# Patient Record
Sex: Female | Born: 1950 | Race: Black or African American | Hispanic: No
Health system: Southern US, Academic
[De-identification: ages and names within clinical notes are randomized; demographics above are authoritative.]

## PROBLEM LIST (undated history)

## (undated) ENCOUNTER — Telehealth: Attending: Family Medicine | Primary: Family Medicine

## (undated) ENCOUNTER — Encounter

## (undated) ENCOUNTER — Encounter: Attending: Hematology & Oncology | Primary: Hematology & Oncology

## (undated) ENCOUNTER — Ambulatory Visit: Payer: MEDICARE

## (undated) ENCOUNTER — Encounter: Attending: Family | Primary: Family

## (undated) ENCOUNTER — Ambulatory Visit

## (undated) ENCOUNTER — Encounter
Attending: Student in an Organized Health Care Education/Training Program | Primary: Student in an Organized Health Care Education/Training Program

## (undated) ENCOUNTER — Ambulatory Visit: Payer: MEDICARE | Attending: Adult Health | Primary: Adult Health

## (undated) ENCOUNTER — Telehealth

## (undated) ENCOUNTER — Ambulatory Visit: Payer: MEDICARE | Attending: Family | Primary: Family

## (undated) ENCOUNTER — Encounter: Attending: Family Medicine | Primary: Family Medicine

## (undated) ENCOUNTER — Telehealth
Attending: Student in an Organized Health Care Education/Training Program | Primary: Student in an Organized Health Care Education/Training Program

## (undated) ENCOUNTER — Encounter: Attending: Adult Health | Primary: Adult Health

## (undated) ENCOUNTER — Ambulatory Visit: Payer: MEDICARE | Attending: Hematology & Oncology | Primary: Hematology & Oncology

## (undated) ENCOUNTER — Encounter: Attending: Radiation Oncology | Primary: Radiation Oncology

## (undated) ENCOUNTER — Encounter: Attending: Surgical Oncology | Primary: Surgical Oncology

## (undated) ENCOUNTER — Telehealth: Attending: Family | Primary: Family

## (undated) ENCOUNTER — Ambulatory Visit: Attending: Radiation Oncology | Primary: Radiation Oncology

## (undated) ENCOUNTER — Encounter: Attending: Pharmacist | Primary: Pharmacist

## (undated) ENCOUNTER — Telehealth: Attending: Clinical | Primary: Clinical

## (undated) ENCOUNTER — Telehealth: Attending: Hematology & Oncology | Primary: Hematology & Oncology

## (undated) ENCOUNTER — Telehealth: Attending: Surgical Oncology | Primary: Surgical Oncology

## (undated) ENCOUNTER — Other Ambulatory Visit

## (undated) ENCOUNTER — Inpatient Hospital Stay

## (undated) ENCOUNTER — Ambulatory Visit: Payer: MEDICARE | Attending: Family Medicine | Primary: Family Medicine

## (undated) ENCOUNTER — Ambulatory Visit
Attending: Student in an Organized Health Care Education/Training Program | Primary: Student in an Organized Health Care Education/Training Program

## (undated) ENCOUNTER — Encounter: Attending: Anesthesiology | Primary: Anesthesiology

## (undated) ENCOUNTER — Telehealth: Attending: Adult Health | Primary: Adult Health

## (undated) ENCOUNTER — Institutional Professional Consult (permissible substitution): Payer: MEDICARE

## (undated) ENCOUNTER — Ambulatory Visit: Payer: MEDICARE | Attending: Radiation Oncology | Primary: Radiation Oncology

## (undated) ENCOUNTER — Ambulatory Visit: Attending: Adult Health | Primary: Adult Health

## (undated) ENCOUNTER — Institutional Professional Consult (permissible substitution): Payer: MEDICARE | Attending: Family Medicine | Primary: Family Medicine

## (undated) ENCOUNTER — Ambulatory Visit
Payer: MEDICARE | Attending: Rehabilitative and Restorative Service Providers" | Primary: Rehabilitative and Restorative Service Providers"

## (undated) ENCOUNTER — Ambulatory Visit
Payer: MEDICARE | Attending: Student in an Organized Health Care Education/Training Program | Primary: Student in an Organized Health Care Education/Training Program

## (undated) ENCOUNTER — Institutional Professional Consult (permissible substitution): Payer: MEDICARE | Attending: Clinical | Primary: Clinical

## (undated) ENCOUNTER — Telehealth: Attending: Internal Medicine | Primary: Internal Medicine

## (undated) ENCOUNTER — Telehealth: Attending: Pharmacist | Primary: Pharmacist

## (undated) DIAGNOSIS — I10 Essential (primary) hypertension: Secondary | ICD-10-CM

## (undated) DIAGNOSIS — C801 Malignant (primary) neoplasm, unspecified: Secondary | ICD-10-CM

## (undated) DIAGNOSIS — I2510 Atherosclerotic heart disease of native coronary artery without angina pectoris: Secondary | ICD-10-CM

## (undated) DIAGNOSIS — E785 Hyperlipidemia, unspecified: Secondary | ICD-10-CM

## (undated) DIAGNOSIS — E079 Disorder of thyroid, unspecified: Secondary | ICD-10-CM

## (undated) DIAGNOSIS — R569 Unspecified convulsions: Secondary | ICD-10-CM

## (undated) HISTORY — PX: MASTECTOMY: SHX3

## (undated) MED ORDER — LEVETIRACETAM 500 MG TABLET: 0 days

## (undated) MED ORDER — LACOSAMIDE 100 MG TABLET: Freq: Two times a day (BID) | ORAL | 0 days

---

## 2005-10-21 ENCOUNTER — Ambulatory Visit: Payer: Self-pay | Admitting: Family Medicine

## 2005-11-03 ENCOUNTER — Ambulatory Visit: Payer: Self-pay | Admitting: Family Medicine

## 2015-08-20 ENCOUNTER — Emergency Department: Payer: Self-pay

## 2015-08-20 ENCOUNTER — Encounter: Payer: Self-pay | Admitting: Emergency Medicine

## 2015-08-20 ENCOUNTER — Emergency Department: Payer: MEDICAID

## 2015-08-20 ENCOUNTER — Inpatient Hospital Stay
Admission: EM | Admit: 2015-08-20 | Discharge: 2015-08-28 | DRG: 871 | Disposition: A | Discharge status: Home / Self Care | Payer: Self-pay | Attending: Internal Medicine | Admitting: Internal Medicine

## 2015-08-20 DIAGNOSIS — A4151 Sepsis due to Escherichia coli [E. coli]: Principal | ICD-10-CM | POA: Diagnosis present

## 2015-08-20 DIAGNOSIS — I252 Old myocardial infarction: Secondary | ICD-10-CM

## 2015-08-20 DIAGNOSIS — R0902 Hypoxemia: Secondary | ICD-10-CM | POA: Diagnosis present

## 2015-08-20 DIAGNOSIS — I214 Non-ST elevation (NSTEMI) myocardial infarction: Secondary | ICD-10-CM | POA: Diagnosis present

## 2015-08-20 DIAGNOSIS — Z955 Presence of coronary angioplasty implant and graft: Secondary | ICD-10-CM

## 2015-08-20 DIAGNOSIS — E875 Hyperkalemia: Secondary | ICD-10-CM | POA: Diagnosis present

## 2015-08-20 DIAGNOSIS — R509 Fever, unspecified: Secondary | ICD-10-CM

## 2015-08-20 DIAGNOSIS — G40919 Epilepsy, unspecified, intractable, without status epilepticus: Secondary | ICD-10-CM | POA: Diagnosis present

## 2015-08-20 DIAGNOSIS — E785 Hyperlipidemia, unspecified: Secondary | ICD-10-CM | POA: Diagnosis present

## 2015-08-20 DIAGNOSIS — F321 Major depressive disorder, single episode, moderate: Secondary | ICD-10-CM | POA: Diagnosis present

## 2015-08-20 DIAGNOSIS — F17210 Nicotine dependence, cigarettes, uncomplicated: Secondary | ICD-10-CM | POA: Diagnosis present

## 2015-08-20 DIAGNOSIS — I5030 Unspecified diastolic (congestive) heart failure: Secondary | ICD-10-CM | POA: Diagnosis present

## 2015-08-20 DIAGNOSIS — J9811 Atelectasis: Secondary | ICD-10-CM | POA: Diagnosis present

## 2015-08-20 DIAGNOSIS — I10 Essential (primary) hypertension: Secondary | ICD-10-CM | POA: Diagnosis present

## 2015-08-20 DIAGNOSIS — I248 Other forms of acute ischemic heart disease: Secondary | ICD-10-CM | POA: Diagnosis present

## 2015-08-20 DIAGNOSIS — Z95828 Presence of other vascular implants and grafts: Secondary | ICD-10-CM

## 2015-08-20 DIAGNOSIS — R296 Repeated falls: Secondary | ICD-10-CM | POA: Diagnosis present

## 2015-08-20 DIAGNOSIS — Z8249 Family history of ischemic heart disease and other diseases of the circulatory system: Secondary | ICD-10-CM

## 2015-08-20 DIAGNOSIS — I2510 Atherosclerotic heart disease of native coronary artery without angina pectoris: Secondary | ICD-10-CM | POA: Diagnosis present

## 2015-08-20 DIAGNOSIS — R6510 Systemic inflammatory response syndrome (SIRS) of non-infectious origin without acute organ dysfunction: Secondary | ICD-10-CM

## 2015-08-20 DIAGNOSIS — R109 Unspecified abdominal pain: Secondary | ICD-10-CM

## 2015-08-20 DIAGNOSIS — M6282 Rhabdomyolysis: Secondary | ICD-10-CM | POA: Diagnosis present

## 2015-08-20 DIAGNOSIS — F329 Major depressive disorder, single episode, unspecified: Secondary | ICD-10-CM

## 2015-08-20 DIAGNOSIS — R410 Disorientation, unspecified: Secondary | ICD-10-CM

## 2015-08-20 DIAGNOSIS — R339 Retention of urine, unspecified: Secondary | ICD-10-CM | POA: Diagnosis present

## 2015-08-20 DIAGNOSIS — E876 Hypokalemia: Secondary | ICD-10-CM | POA: Diagnosis present

## 2015-08-20 DIAGNOSIS — W19XXXA Unspecified fall, initial encounter: Secondary | ICD-10-CM | POA: Diagnosis present

## 2015-08-20 DIAGNOSIS — Z9114 Patient's other noncompliance with medication regimen: Secondary | ICD-10-CM

## 2015-08-20 DIAGNOSIS — A419 Sepsis, unspecified organism: Secondary | ICD-10-CM | POA: Diagnosis present

## 2015-08-20 DIAGNOSIS — B9620 Unspecified Escherichia coli [E. coli] as the cause of diseases classified elsewhere: Secondary | ICD-10-CM

## 2015-08-20 DIAGNOSIS — Z79899 Other long term (current) drug therapy: Secondary | ICD-10-CM

## 2015-08-20 DIAGNOSIS — G9341 Metabolic encephalopathy: Secondary | ICD-10-CM | POA: Diagnosis present

## 2015-08-20 DIAGNOSIS — R7881 Bacteremia: Secondary | ICD-10-CM

## 2015-08-20 DIAGNOSIS — F32A Depression, unspecified: Secondary | ICD-10-CM

## 2015-08-20 HISTORY — DX: Hyperlipidemia, unspecified: E78.5

## 2015-08-20 HISTORY — DX: Unspecified convulsions: R56.9

## 2015-08-20 HISTORY — DX: Atherosclerotic heart disease of native coronary artery without angina pectoris: I25.10

## 2015-08-20 HISTORY — DX: Essential (primary) hypertension: I10

## 2015-08-20 HISTORY — DX: Disorder of thyroid, unspecified: E07.9

## 2015-08-20 LAB — COMPREHENSIVE METABOLIC PANEL
ALBUMIN: 3.9 g/dL (ref 3.5–5.0)
ALK PHOS: 121 U/L (ref 38–126)
ALT: 19 U/L (ref 14–54)
AST: 159 U/L — AB (ref 15–41)
Anion gap: 12 (ref 5–15)
BILIRUBIN TOTAL: 1 mg/dL (ref 0.3–1.2)
BUN: 17 mg/dL (ref 6–20)
CALCIUM: 8.3 mg/dL — AB (ref 8.9–10.3)
CO2: 31 mmol/L (ref 22–32)
CREATININE: 0.97 mg/dL (ref 0.44–1.00)
Chloride: 91 mmol/L — ABNORMAL LOW (ref 101–111)
GFR calc Af Amer: >60 mL/min (ref >60)
GFR calc non Af Amer: >60 mL/min (ref >60)
GLUCOSE: 109 mg/dL — AB (ref 65–99)
Potassium: 2.5 mmol/L — CL (ref 3.5–5.1)
SODIUM: 134 mmol/L — AB (ref 135–145)
Total Protein: 8.3 g/dL — ABNORMAL HIGH (ref 6.5–8.1)

## 2015-08-20 LAB — PROTIME-INR
INR: 1.77
Prothrombin Time: 20.6 seconds — ABNORMAL HIGH (ref 11.4–15.0)

## 2015-08-20 LAB — URINALYSIS COMPLETE WITH MICROSCOPIC (ARMC ONLY)
Bilirubin Urine: NEGATIVE
Glucose, UA: NEGATIVE mg/dL
Nitrite: POSITIVE — AB
PROTEIN: 100 mg/dL — AB
SPECIFIC GRAVITY, URINE: 1.013 (ref 1.005–1.030)
SQUAMOUS EPITHELIAL / LPF: NONE SEEN
pH: 5 (ref 5.0–8.0)

## 2015-08-20 LAB — CBC
HEMATOCRIT: 42.7 % (ref 35.0–47.0)
HEMOGLOBIN: 14 g/dL (ref 12.0–16.0)
MCH: 31.7 pg (ref 26.0–34.0)
MCHC: 32.8 g/dL (ref 32.0–36.0)
MCV: 96.6 fL (ref 80.0–100.0)
Platelets: 179 10*3/uL (ref 150–440)
RBC: 4.42 MIL/uL (ref 3.80–5.20)
RDW: 14.2 % (ref 11.5–14.5)
WBC: 13.5 10*3/uL — ABNORMAL HIGH (ref 3.6–11.0)

## 2015-08-20 LAB — TROPONIN I: Troponin I: 0.62 ng/mL — ABNORMAL HIGH (ref <0.031)

## 2015-08-20 LAB — PROCALCITONIN: Procalcitonin: 7.9 ng/mL

## 2015-08-20 LAB — INFLUENZA PANEL BY PCR (TYPE A & B)
H1N1 flu by pcr: NOT DETECTED
Influenza A By PCR: NEGATIVE
Influenza B By PCR: NEGATIVE

## 2015-08-20 LAB — CK: CK TOTAL: 20287 U/L — AB (ref 38–234)

## 2015-08-20 LAB — LACTIC ACID, PLASMA
LACTIC ACID, VENOUS: 1.6 mmol/L (ref 0.5–2.0)
Lactic Acid, Venous: 1 mmol/L (ref 0.5–2.0)

## 2015-08-20 LAB — APTT

## 2015-08-20 LAB — LIPASE, BLOOD: Lipase: 12 U/L (ref 11–51)

## 2015-08-20 LAB — PHENYTOIN LEVEL, TOTAL: Phenytoin Lvl: 31.2 ug/mL (ref 10.0–20.0)

## 2015-08-20 IMAGING — CT CT HEAD W/O CM
1 series · 16 of 27 positions shown, 20 images · non-contrast
Comparison: None.

CLINICAL DATA: 64-year-old female with altered mental status

EXAM:
CT HEAD WITHOUT CONTRAST
TECHNIQUE: Contiguous axial images were obtained from the base of the skull
through the vertex without intravenous contrast.

[Series 2: soft tissue · axial · 0.47mm/px · z∈[-159,-39]mm · 16 of 27 slices shown, 20 images]
[im 2/27  brain]
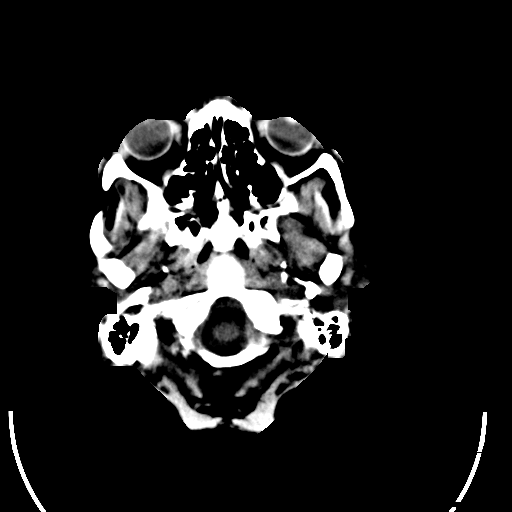
[im 2/27  bone]
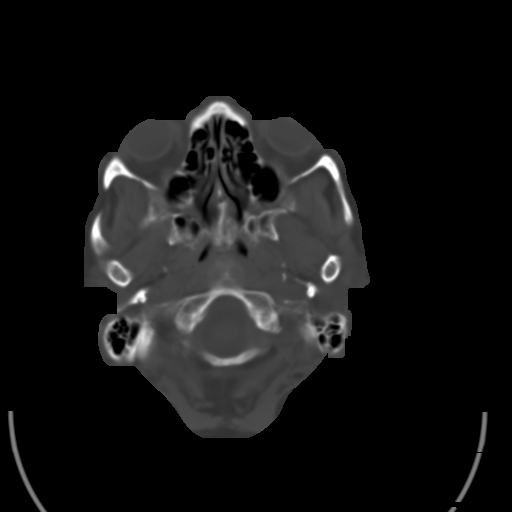
[im 4/27  brain]
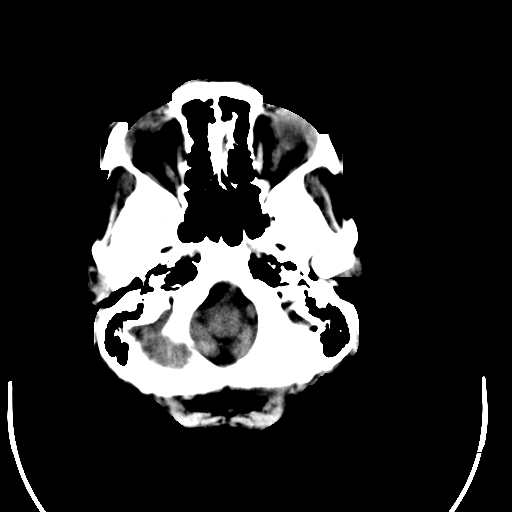
[im 5/27  brain]
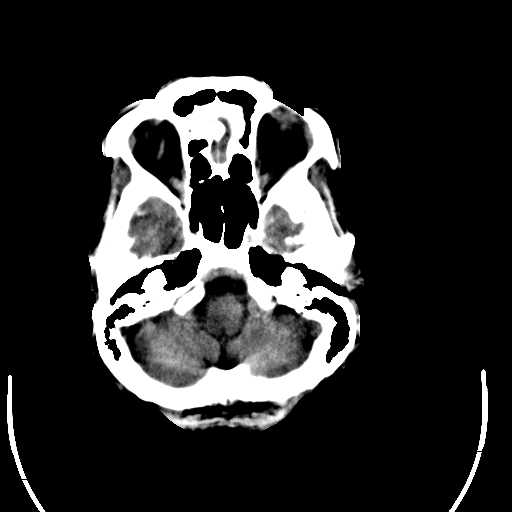
[im 7/27  brain]
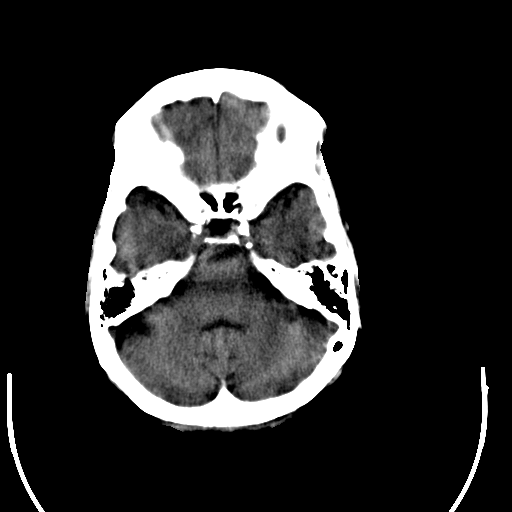
[im 9/27  brain]
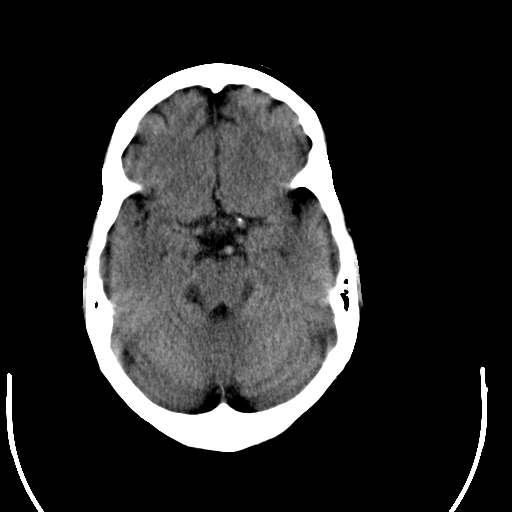
[im 9/27  bone]
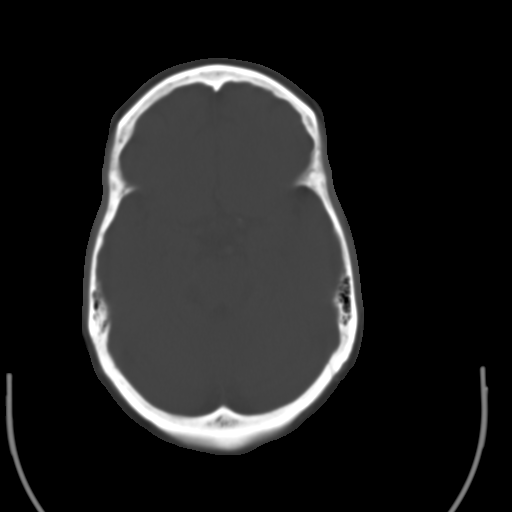
[im 10/27  brain]
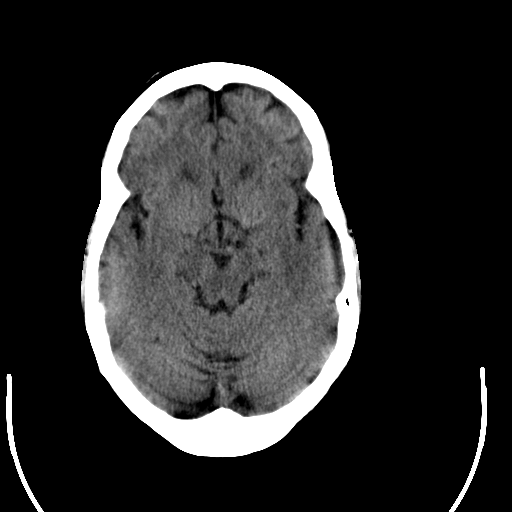
[im 12/27  brain]
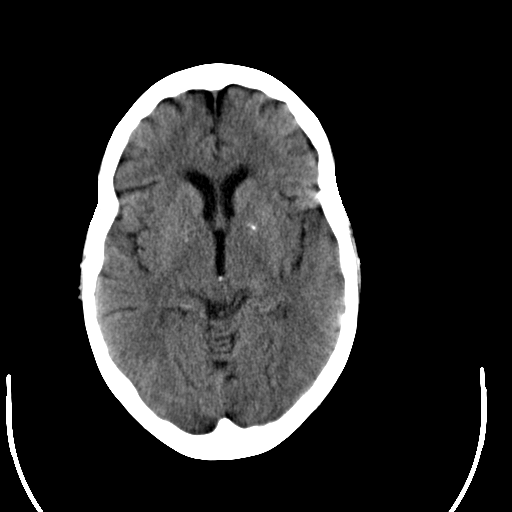
[im 13/27  brain]
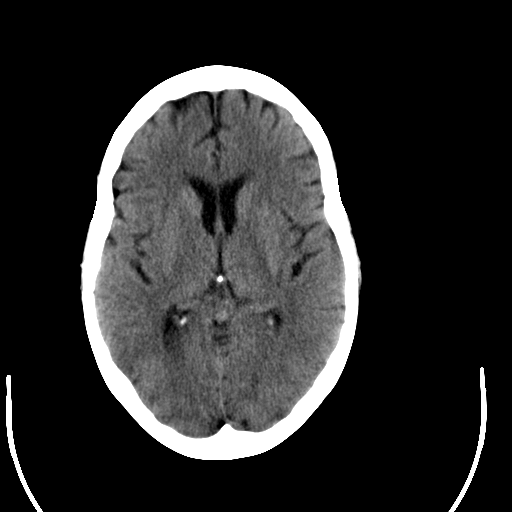
[im 15/27  brain]
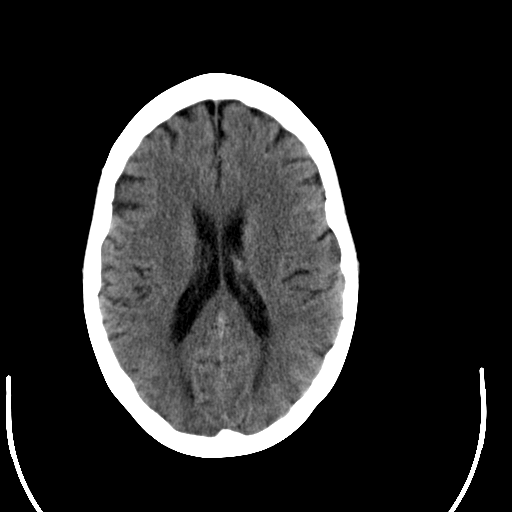
[im 15/27  bone]
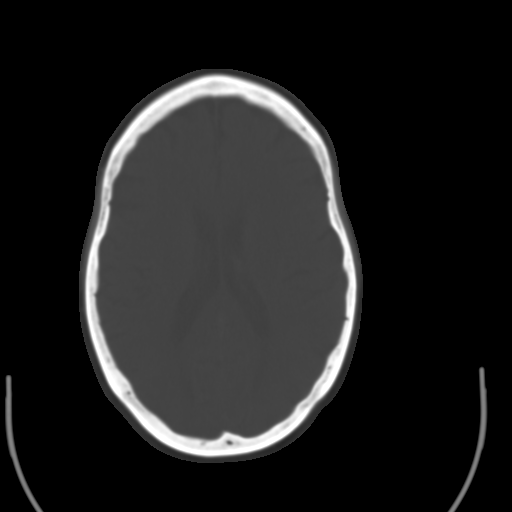
[im 16/27  brain]
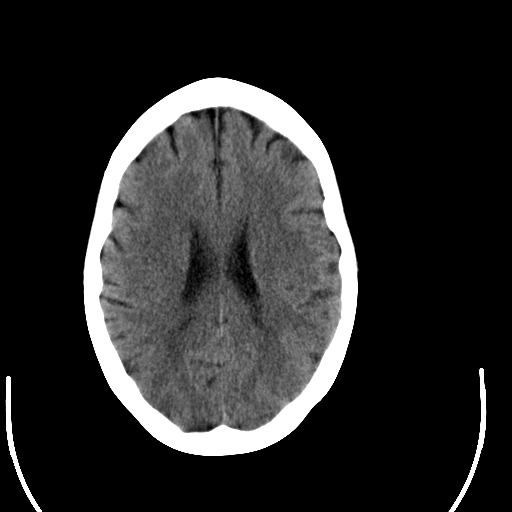
[im 18/27  brain]
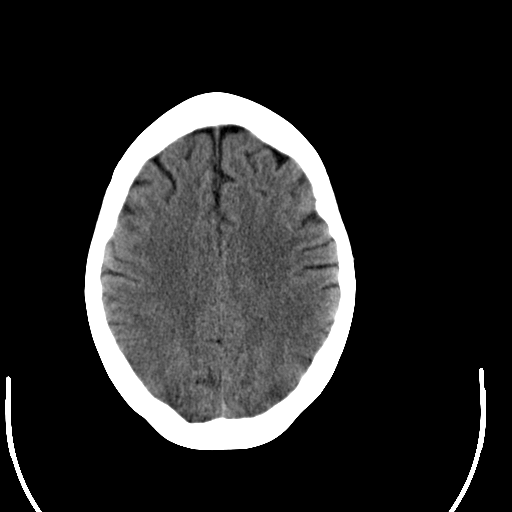
[im 19/27  brain]
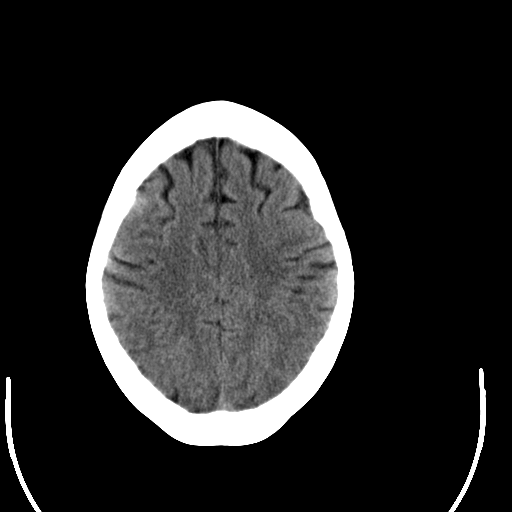
[im 21/27  brain]
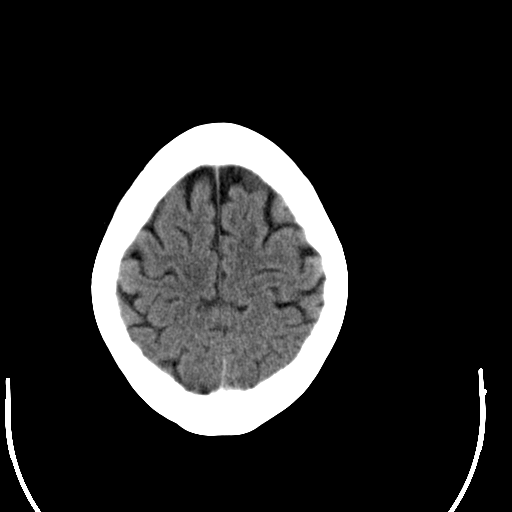
[im 21/27  bone]
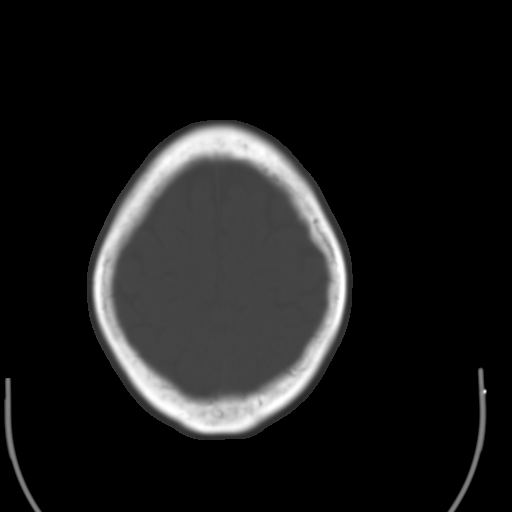
[im 23/27  brain]
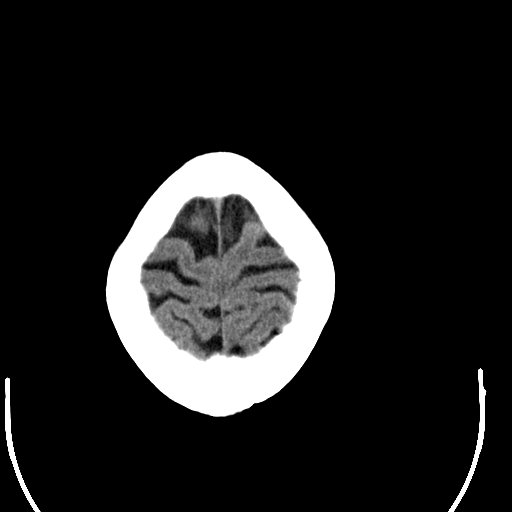
[im 24/27  brain]
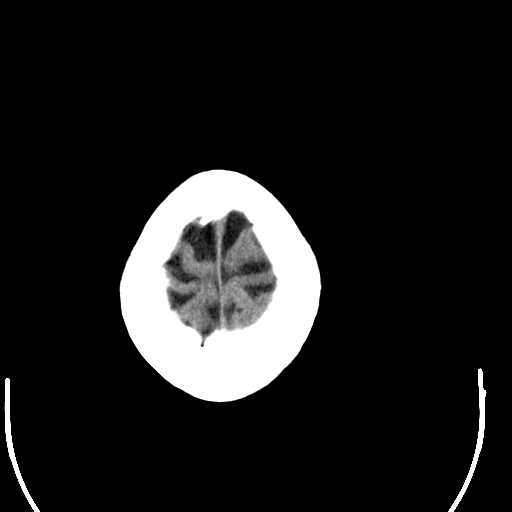
[im 26/27  brain]
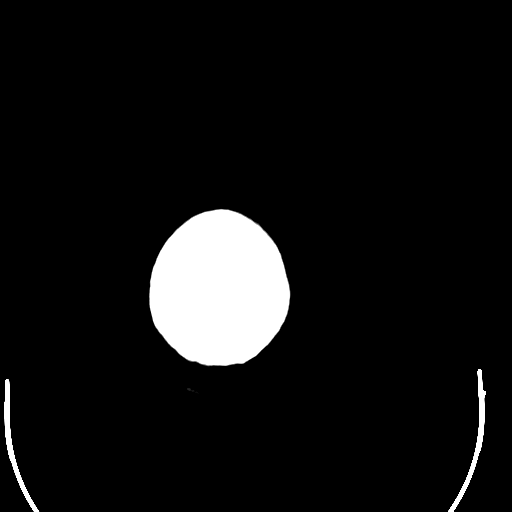

[16 of 27 positions shown; findings below may reference images not displayed]

FINDINGS: Negative for acute intracranial hemorrhage, acute infarction, mass,
mass effect, hydrocephalus or midline shift. Gray-white
differentiation is preserved throughout. Very mild periventricular
white matter hypoattenuation consistent with chronic small vessel
ischemic white matter disease. No focal soft tissue or calvarial
abnormality. Globes and orbits are unremarkable bilaterally. Normal
aeration of the mastoid air cells and visualized paranasal sinuses.
Atherosclerotic calcifications in both cavernous carotid arteries.
IMPRESSION: 1. No acute intracranial abnormality.
2. Minimal chronic microvascular ischemic white matter disease.
3. Intracranial atherosclerosis.

## 2015-08-20 IMAGING — CR DG CHEST 1V PORT
1 series · 1 of 1 positions shown · non-contrast
Comparison: None.

CLINICAL DATA: Altered mental status today, patient smokes

EXAM:
PORTABLE CHEST 1 VIEW

[ap]
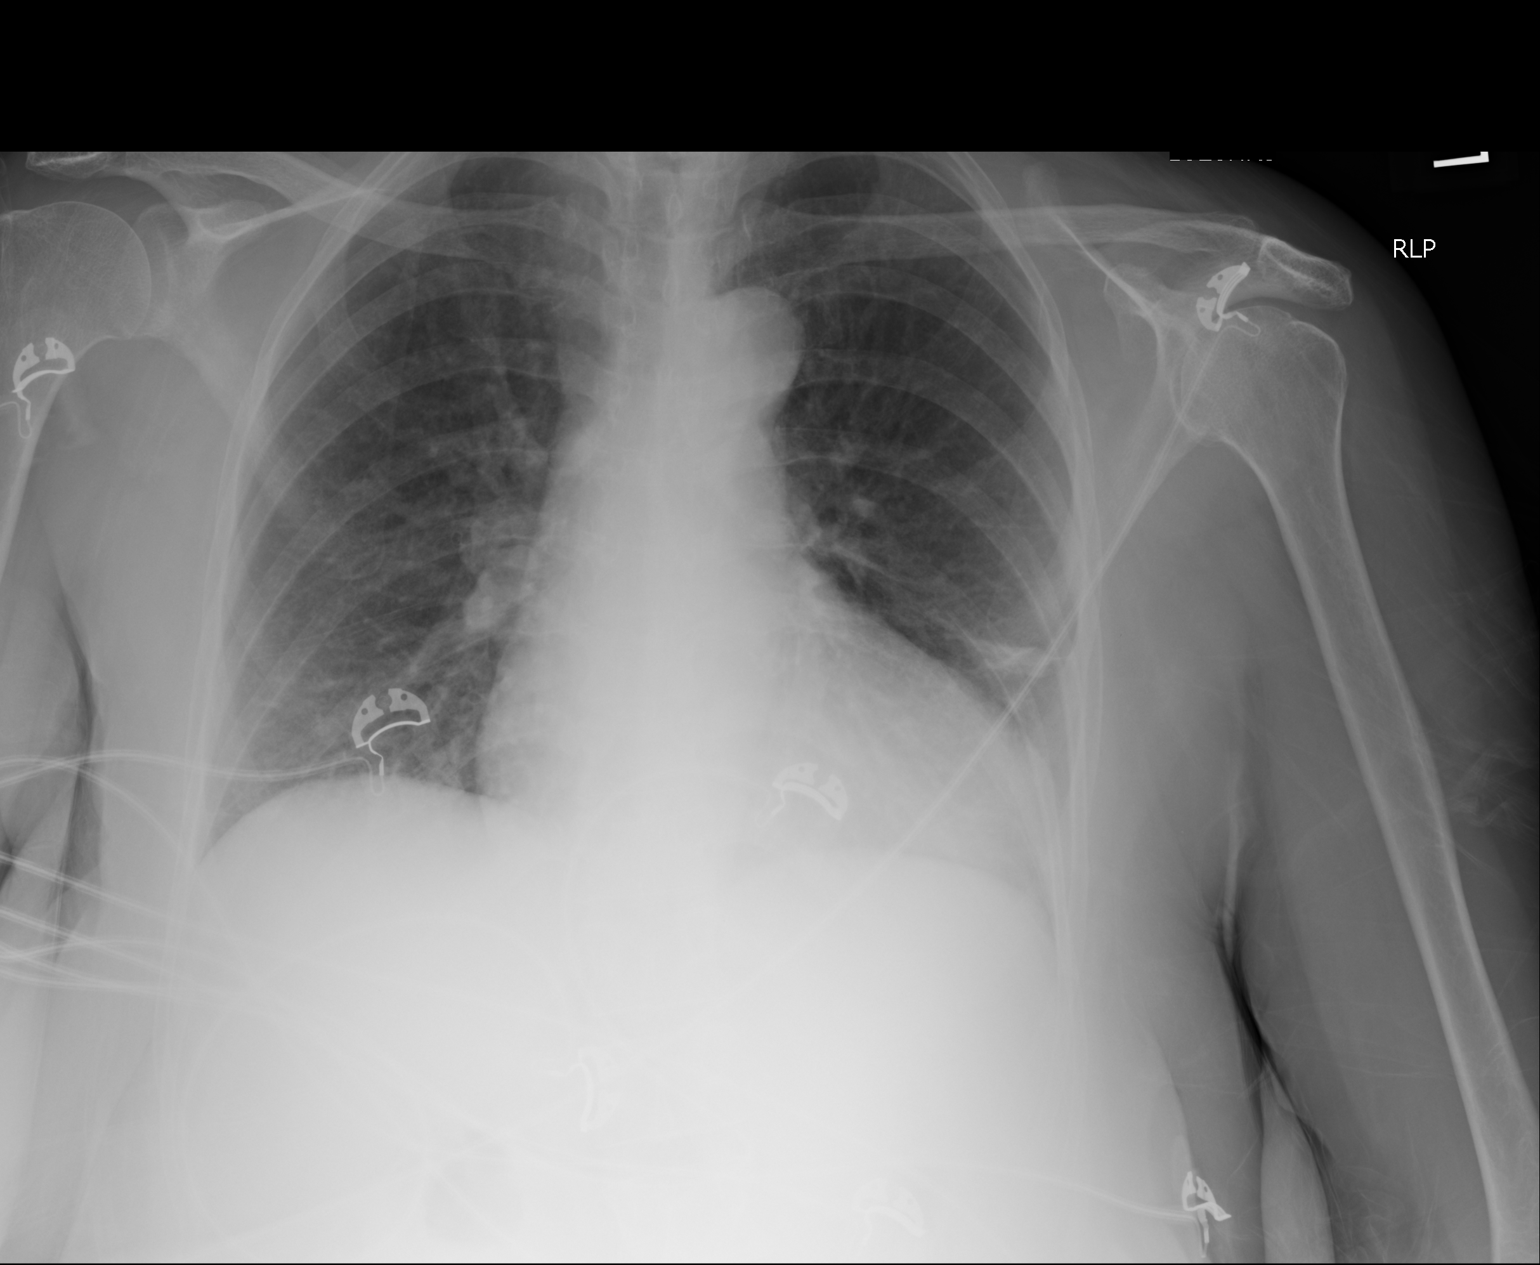

[1 of 1 positions shown; findings below may reference images not displayed]

FINDINGS: Mild cardiac silhouette enlargement. Vascular pattern normal. Right
lung clear. Band of opacity laterally in the lingula appears most
consistent with subsegmental atelectasis.
IMPRESSION: Mild subsegmental atelectasis in the lingula ; cannot exclude
possibility that this may be due to central obstructing process
although mediastinal and hilar contours appear within normal limits
on this single AP view. Correlate clinically.

## 2015-08-20 MED ORDER — PIPERACILLIN-TAZOBACTAM 3.375 G IVPB
3.3750 g | Freq: Three times a day (TID) | INTRAVENOUS | Status: DC
  Administered 2015-08-21: 3.375 g via INTRAVENOUS
  Filled 2015-08-20 (×3): qty 50

## 2015-08-20 MED ORDER — SODIUM CHLORIDE 0.9 % IJ SOLN
3.0000 mL | Freq: Two times a day (BID) | INTRAMUSCULAR | Status: DC
Start: 2015-08-20 — End: 2015-08-28
  Administered 2015-08-21 – 2015-08-28 (×11): 3 mL via INTRAVENOUS

## 2015-08-20 MED ORDER — ASPIRIN 300 MG RE SUPP
150.0000 mg | Freq: Once | RECTAL | Status: AC
  Administered 2015-08-20: 150 mg via RECTAL
  Filled 2015-08-20: qty 1

## 2015-08-20 MED ORDER — VANCOMYCIN HCL IN DEXTROSE 1-5 GM/200ML-% IV SOLN
1000.0000 mg | Freq: Once | INTRAVENOUS | Status: AC
Start: 2015-08-20 — End: 2015-08-20
  Administered 2015-08-20: 1000 mg via INTRAVENOUS
  Filled 2015-08-20: qty 200

## 2015-08-20 MED ORDER — HEPARIN BOLUS VIA INFUSION
3900.0000 [IU] | Freq: Once | INTRAVENOUS | Status: AC
  Administered 2015-08-20: 3900 [IU] via INTRAVENOUS
  Filled 2015-08-20: qty 3900

## 2015-08-20 MED ORDER — ACETAMINOPHEN 325 MG PO TABS
650.0000 mg | ORAL_TABLET | Freq: Four times a day (QID) | ORAL | Status: DC | PRN
  Administered 2015-08-21 – 2015-08-26 (×4): 650 mg via ORAL
  Filled 2015-08-20 (×4): qty 2

## 2015-08-20 MED ORDER — HEPARIN (PORCINE) IN NACL 100-0.45 UNIT/ML-% IJ SOLN
800.0000 [IU]/h | INTRAMUSCULAR | Status: DC
  Administered 2015-08-20: 800 [IU]/h via INTRAVENOUS
  Filled 2015-08-20: qty 250

## 2015-08-20 MED ORDER — HYDRALAZINE HCL 20 MG/ML IJ SOLN: 10.0000 mg | INTRAMUSCULAR | Status: DC | PRN

## 2015-08-20 MED ORDER — ONDANSETRON HCL 4 MG/2ML IJ SOLN
4.0000 mg | Freq: Four times a day (QID) | INTRAMUSCULAR | Status: DC | PRN
Start: 2015-08-20 — End: 2015-08-28

## 2015-08-20 MED ORDER — SODIUM CHLORIDE 0.9 % IV BOLUS (SEPSIS)
1000.0000 mL | Freq: Once | INTRAVENOUS | Status: AC
  Administered 2015-08-20: 1000 mL via INTRAVENOUS

## 2015-08-20 MED ORDER — OXYCODONE HCL 5 MG PO TABS
5.0000 mg | ORAL_TABLET | ORAL | Status: DC | PRN
  Administered 2015-08-23 – 2015-08-25 (×2): 5 mg via ORAL
  Filled 2015-08-20 (×2): qty 1

## 2015-08-20 MED ORDER — POTASSIUM CHLORIDE 10 MEQ/100ML IV SOLN
10.0000 meq | INTRAVENOUS | Status: AC
  Administered 2015-08-20 (×2): 10 meq via INTRAVENOUS
  Filled 2015-08-20 (×3): qty 100

## 2015-08-20 MED ORDER — ACETAMINOPHEN 650 MG RE SUPP
650.0000 mg | Freq: Once | RECTAL | Status: AC
  Administered 2015-08-20: 650 mg via RECTAL
  Filled 2015-08-20: qty 1

## 2015-08-20 MED ORDER — IPRATROPIUM-ALBUTEROL 0.5-2.5 (3) MG/3ML IN SOLN
3.0000 mL | Freq: Once | RESPIRATORY_TRACT | Status: AC
  Administered 2015-08-20: 3 mL via RESPIRATORY_TRACT
  Filled 2015-08-20: qty 3

## 2015-08-20 MED ORDER — ACETAMINOPHEN 650 MG RE SUPP
650.0000 mg | Freq: Four times a day (QID) | RECTAL | Status: DC | PRN
Start: 2015-08-20 — End: 2015-08-28

## 2015-08-20 MED ORDER — ONDANSETRON HCL 4 MG PO TABS: 4.0000 mg | ORAL_TABLET | Freq: Four times a day (QID) | ORAL | Status: DC | PRN

## 2015-08-20 MED ORDER — METHYLPREDNISOLONE SODIUM SUCC 125 MG IJ SOLR
125.0000 mg | INTRAMUSCULAR | Status: AC
  Administered 2015-08-20: 125 mg via INTRAVENOUS
  Filled 2015-08-20: qty 2

## 2015-08-20 MED ORDER — POTASSIUM CHLORIDE IN NACL 20-0.9 MEQ/L-% IV SOLN
INTRAVENOUS | Status: DC
  Administered 2015-08-20 – 2015-08-24 (×3): via INTRAVENOUS
  Filled 2015-08-20 (×12): qty 1000

## 2015-08-20 MED ORDER — PIPERACILLIN-TAZOBACTAM 3.375 G IVPB
3.3750 g | Freq: Once | INTRAVENOUS | Status: AC
  Administered 2015-08-20: 3.375 g via INTRAVENOUS
  Filled 2015-08-20: qty 50

## 2015-08-20 MED ORDER — MORPHINE SULFATE (PF) 2 MG/ML IV SOLN
2.0000 mg | INTRAVENOUS | Status: DC | PRN
  Administered 2015-08-21 – 2015-08-27 (×7): 2 mg via INTRAVENOUS
  Filled 2015-08-20 (×7): qty 1

## 2015-08-20 MED ORDER — VANCOMYCIN HCL IN DEXTROSE 1-5 GM/200ML-% IV SOLN
1000.0000 mg | INTRAVENOUS | Status: DC
  Filled 2015-08-20 (×2): qty 200

## 2015-08-20 NOTE — H&P (Signed)
[facility] NAME: Alyda Nier    MR#:  [id number]  DATE OF BIRTH:  [birth year]   DATE OF ADMISSION:  08/20/2015  PRIMARY CARE PHYSICIAN: Pcp Not In System   REQUESTING/REFERRING PHYSICIAN: Quale  CHIEF COMPLAINT:   Chief Complaint  Patient presents with  . Altered Mental Status    HISTORY OF PRESENT ILLNESS:  Machelle Cherian  is a 64 y.o. female with a known history of seizure disorder, coronary artery disease status post stent placement, medical noncompliance is presenting with altered mental status. Patient is unable to provide any meaningful information given mental status medical condition history obtained from patient's sisters present at bedside. They state that she has had progressive decline over the course of several months however most prominent over the last 2-3 days where she has had profound weakness to the point where she is unable to relate by herself to the bathroom. She has also become increasingly lethargic over the past same time period. As far as localizing symptoms they described she has a cough however this is chronic as on a usual daily basis she stays in the couch sleeps and smokes approximately 2-3 packs of cigarettes daily Upon arriving to the emergency department noted to be febrile as well as leukocytosis  PAST MEDICAL HISTORY:   Past Medical History  Diagnosis Date  . Seizures ([place])   . Thyroid disease   . Coronary artery disease   . Hypertension   . Hyperlipidemia     PAST SURGICAL HISTORY:  History reviewed. No pertinent past surgical history.  SOCIAL HISTORY:   Social History  Substance Use Topics  . Smoking status: Current Every Day Smoker -- 1.00 packs/day    Types: Cigarettes  . Smokeless tobacco: Never Used  . Alcohol Use: No    FAMILY HISTORY:   Family History  Problem Relation Age of Onset  . Hypertension Other     DRUG ALLERGIES:  No Known Allergies  REVIEW  OF SYSTEMS:  Unable to obtain given patient's mental status/medical condition   MEDICATIONS AT HOME:   Prior to Admission medications   Medication Sig Start Date End Date Taking? Authorizing Provider  clonazePAM (KLONOPIN) 1 MG tablet Take 1 mg by mouth 2 (two) times daily.   Yes Historical Provider, MD  gabapentin (NEURONTIN) 300 MG capsule Take 300 mg by mouth 3 (three) times daily.   Yes Historical Provider, MD  levETIRAcetam (KEPPRA) 500 MG tablet Take 500-1,000 mg by mouth 2 (two) times daily. Take 1 tablet every morning and 2 tablets every evening.   Yes Historical Provider, MD  phenytoin (DILANTIN) 100 MG ER capsule Take 300 mg by mouth at bedtime.   Yes Historical Provider, MD      VITAL SIGNS:  Blood pressure 135/86, pulse 108, temperature 104.2 F (40.1 C), temperature source Oral, resp. rate 19, SpO2 97 %.  PHYSICAL EXAMINATION:  VITAL SIGNS: Filed Vitals:   08/20/15 2000 08/20/15 2030  BP: 184/82 135/86  Pulse: 105 108  Temp:    Resp: 30 19   GENERAL:64 y.o.female currently in ill appearing  HEAD: Normocephalic, atraumatic.  EYES: Pupils equal, round, reactive to light. Extraocular muscles intact. No scleral icterus.  MOUTH:Dry mucosal membrane. Dentition intact. No abscess noted.  EAR, NOSE, THROAT: Clear without exudates. No external lesions.  NECK: Supple. No thyromegaly. No nodules. No JVD.  PULMONARY:  diminished breath sounds  without wheeze rails or rhonci.  tachypneic No use of  accessory muscles,  poorratory effort. good air entry bilaterally CHEST: Nontender to palpation.  CARDIOVASCULAR: S1 and S2.Tachycardic  No murmurs, rubs, or gallops. No edema. Pedal pulses 2+ bilaterally.  GASTROINTESTINAL: Soft, nontender, nondistended. No masses. Positive bowel sounds. No hepatosplenomegaly.  MUSCULOSKELETAL: No swelling, clubbing, or edema. Range of motion full in all extremities.  NEUROLunable to fully assess given patient's mental status/medical condition she  does grimace to painful stimuli SKIN: No ulceration, lesions, rashes, or cyanosis. Skin  hot and dry. Turgor intact.  PSYCHIATRIC unable to assess given patient's mental status/medical condition  LABORATORY PANEL:   CBC  Recent Labs Lab 08/20/15 1829  WBC 13.5*  HGB 14.0  HCT 42.7  PLT 179   ------------------------------------------------------------------------------------------------------------------  Chemistries   Recent Labs Lab 08/20/15 1829  NA 134*  K 2.5*  CL 91*  CO2 31  GLUCOSE 109*  BUN 17  CREATININE 0.97  CALCIUM 8.3*  AST 159*  ALT 19  ALKPHOS 121  BILITOT 1.0   ------------------------------------------------------------------------------------------------------------------  Cardiac Enzymes  Recent Labs Lab 08/20/15 1829  TROPONINI 0.62*   ------------------------------------------------------------------------------------------------------------------  RADIOLOGY:  Ct Head Wo Contrast  08/20/2015  CLINICAL DATA:  64 year old female with altered mental status EXAM: CT HEAD WITHOUT CONTRAST TECHNIQUE: Contiguous axial images were obtained from the base of the skull through the vertex without intravenous contrast. COMPARISON:  None. FINDINGS: Negative for acute intracranial hemorrhage, acute infarction, mass, mass effect, hydrocephalus or midline shift. Gray-white differentiation is preserved throughout. 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 No focal soft tissue or calvarial abnormality. Globes and orbits are unremarkable bilaterally. Normal aeration of the mastoid air cells and visualized paranasal sinuses. Atherosclerotic calcifications in both cavernous carotid arteries. IMPRESSION: 1. No acute intracranial abnormality. 2. 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 3. Intracranial atherosclerosis. Electronically Signed   By: Jacqulynn Cadet M.D.    On: 08/20/2015 19:29   Dg Chest Port 1 View  08/20/2015  CLINICAL DATA:  Altered mental status today, patient smokes EXAM: PORTABLE CHEST 1 VIEW COMPARISON:  None. FINDINGS: Mild cardiac silhouette enlargement. Vascular pattern normal. Right lung clear. Band of opacity laterally in the lingula appears most consistent with subsegmental atelectasis. IMPRESSION: Mild subsegmental atelectasis in the lingula ; cannot exclude possibility that this may be due to central obstructing process although mediastinal and hilar contours appear within normal limits on this single AP view. Correlate clinically. Electronically Signed   By: Skipper Cliche M.D.   On: 08/20/2015 18:48    EKG:   Orders placed or performed during the hospital encounter of 08/20/15  . ED EKG  . ED EKG  . EKG 12-Lead  . EKG 12-Lead    IMPRESSION AND PLAN:   64 year old African-American female history of coronary artery disease as well as seizure disorder and medical noncompliance presenting with altered mental status  1.Sepsis, meeting septic criteria by temperature, leukocytosis, heart rate present on arrival. Source unclear etiology at this time urine sample pending  Panculture. Broad-spectrum antibiotics including Comycin/Zosyn and taper antibiotics when culture data returns. He has received a 30 mL/kg IV fluid bolus. Continue IV fluid hydration to keep mean arterial pressure greater than 65. He may require pressor therapy if blood pressure worsens. We will repeat lactic acid given the initial is greater than 2.2.  2. NSTEMI: Suspect this is secondary to demand however unable to obtain history from patient's we'll treat as true NSTEMI aspirin therapy to avoid statins given rhabdo, therapeutic heparin check  echocardiogram will consult cardiology 3. Rhabdomyolysis: IV fluid hydration will avoid statin therapy in the interim follow CK 4. Hypertension essential: Hold diuretics continue medication as as needed hydralazine 5.  Hypokalemia replace potassium check magnesium level goal 4-5 6. Venous from embolism prophylactic: Therapeutic heparin     All the records are reviewed and case discussed with ED provider. Management plans discussed with the patient, family and they are in agreement.  CODE STATUS: Full  TOTAL TIME TAKING CARE OF THIS PATIENT: 55 minutes.    Mckenlee Mangham,  Karenann Cai.D on 08/20/2015 at 8:46 PM  Between 7am to 6pm - Pager - [phone]  After 6pm: House Pager: - [phone]  [facility]  Office  [phone]  CC: Primary care physician; Pcp Not In System

## 2015-08-20 NOTE — ED Notes (Addendum)
Pt presents to ED via EMS for altered mental status for unknown date and time. Family found pt this evening confused. Per EMS, CBG-86. Sister reports pt was last seen well on Saturday. Then was able to call one of her sisters early this morning around 200, stating that she was feeling weak and she fell. Sisters came to her place and she was sitter in a chair unable to ambulate and confused. Pt was confused to date at arrival.

## 2015-08-20 NOTE — ED Notes (Signed)
Pharmacy call to send potassium, states will send it.

## 2015-08-20 NOTE — ED Provider Notes (Signed)
[facility] Emergency Department Provider Note REMINDER - THIS NOTE IS NOT A FINAL MEDICAL RECORD UNTIL IT IS SIGNED. UNTIL THEN, THE CONTENT BELOW MAY REFLECT INFORMATION FROM A DOCUMENTATION TEMPLATE, NOT THE ACTUAL PATIENT VISIT. ____________________________________________  Time seen: Approximately 7:00 PM  I have reviewed the triage vital signs and the nursing notes.   HISTORY  Chief Complaint Altered Mental Status  EM caveat: Patient confused and unable to provide clear history  HPI Anne Phillips is a 64 y.o. female sent for evaluation of confusion.  No family available. Per EMS the patient was seen by family and they found her confused today. She was sitting in a chair unable to get herself out. The patient tells me that she couldn't get out of the chair for the last day, but she is very unclear and can't really recall falling or otherwise. She is not found to be down or injured.  Patient denies being in pain.  Patient is unable to provide her past medical history  Past Medical History  Diagnosis Date  . Seizures ([place])   . Thyroid disease   . Coronary artery disease   . Hypertension   . Hyperlipidemia     Patient Active Problem List   Diagnosis Date Noted  . Sepsis ([place]) 08/20/2015  . NSTEMI (non-ST elevated myocardial infarction) ([place]) 08/20/2015  . Rhabdomyolysis 08/20/2015    History reviewed. No pertinent past surgical history.  Current Outpatient Rx  Name  Route  Sig  Dispense  Refill  . clonazePAM (KLONOPIN) 1 MG tablet   Oral   Take 1 mg by mouth 2 (two) times daily.         Marland Kitchen gabapentin (NEURONTIN) 300 MG capsule   Oral   Take 300 mg by mouth 3 (three) times daily.         Marland Kitchen levETIRAcetam (KEPPRA) 500 MG tablet   Oral   Take 500-1,000 mg by mouth 2 (two) times daily. Take 1 tablet every morning and 2 tablets every evening.         . phenytoin (DILANTIN) 100 MG ER capsule   Oral   Take 300 mg by mouth at  bedtime.           Allergies Review of patient's allergies indicates no known allergies.  Family History  Problem Relation Age of Onset  . Hypertension Other     Social History Social History  Substance Use Topics  . Smoking status: Current Every Day Smoker -- 1.00 packs/day    Types: Cigarettes  . Smokeless tobacco: Never Used  . Alcohol Use: No    Review of Systems  Patient denies being in pain. She is not able to provide much additional history.  10-point ROS otherwise negative.  ____________________________________________   PHYSICAL EXAM:  VITAL SIGNS: ED Triage Vitals  Enc Vitals Group     BP 08/20/15 1814 164/96 mmHg     Pulse Rate 08/20/15 1814 119     Resp 08/20/15 1814 18     Temp 08/20/15 1814 104.2 F (40.1 C)     Temp Source 08/20/15 1814 Oral     SpO2 08/20/15 1814 89 %     Weight --      Height --      Head Cir --      Peak Flow --      Pain Score 08/20/15 1815 0     Pain Loc --      Pain Edu? --  Excl. in [place]? --    Constitutional: Alert to voice, does not appear to be any distress. Resting in the bed, opens her eyes to voice and answer simple questions but is confused to date and time. Eyes: Conjunctivae are normal. PERRL. EOMI. Head: Atraumatic. Nose: No congestion/rhinnorhea. Mouth/Throat: Mucous membranes are dry.  Oropharynx non-erythematous. Neck: No stridor. No cervical spine tenderness  Cardiovascular: Very tachycardic rate, regular rhythm. Moderate systolic ejection murmur  Good peripheral circulation. Respiratory: Normal respiratory effort.  No retractions. Lungs CTAB. Gastrointestinal: Soft and nontender. No distention. No abdominal bruits. No CVA tenderness. Musculoskeletal: No lower extremity tenderness nor edema.  No joint effusions. Neurologic:  Normal speech and language. No gross focal neurologic deficits are appreciated. Skin:  Skin is warm, dry and intact. No rash noted. Psychiatric: Mood and affect are normal.  Speech and behavior are normal.  ____________________________________________   LABS (all labs ordered are listed, but only abnormal results are displayed)  Labs Reviewed  CBC - Abnormal; Notable for the following:    WBC 13.5 (*)    All other components within normal limits  COMPREHENSIVE METABOLIC PANEL - Abnormal; Notable for the following:    Sodium 134 (*)    Potassium 2.5 (*)    Chloride 91 (*)    Glucose, Bld 109 (*)    Calcium 8.3 (*)    Total Protein 8.3 (*)    AST 159 (*)    All other components within normal limits  TROPONIN I - Abnormal; Notable for the following:    Troponin I 0.62 (*)    All other components within normal limits  CK - Abnormal; Notable for the following:    Total CK 20[address] (*)    All other components within normal limits  URINALYSIS COMPLETEWITH MICROSCOPIC ([facility] ONLY) - Abnormal; Notable for the following:    Color, Urine YELLOW (*)    APPearance CLOUDY (*)    Ketones, ur TRACE (*)    Hgb urine dipstick 3+ (*)    Protein, ur 100 (*)    Nitrite POSITIVE (*)    Leukocytes, UA 2+ (*)    Bacteria, UA RARE (*)    All other components within normal limits  PHENYTOIN LEVEL, TOTAL - Abnormal; Notable for the following:    Phenytoin Lvl 31.2 (*)    All other components within normal limits  CULTURE, BLOOD (ROUTINE X 2)  CULTURE, BLOOD (ROUTINE X 2)  LIPASE, BLOOD  LACTIC ACID, PLASMA  INFLUENZA PANEL BY PCR (TYPE A & B, H1N1)  LACTIC ACID, PLASMA  BASIC METABOLIC PANEL  CBC  PROLACTIN  PROCALCITONIN  PROTIME-INR  APTT  HEPARIN LEVEL (UNFRACTIONATED)   ____________________________________________  EKG  Reviewed and interpreted by me at 1850 Sinus tachycardia Heart rate 110 No significant ischemic changes noted Probable left ventricular hypertrophy with repolarization changes noted anteroseptal PR 100 QTc 440 QRS 90  ____________________________________________  RADIOLOGY   CT Head Wo Contrast (Final result) Result time:  08/20/15 19:29:30   Final result by Rad Results In Interface (08/20/15 19:29:30)   Narrative:   CLINICAL DATA: 64 year old female with altered mental status  EXAM: CT HEAD WITHOUT CONTRAST  TECHNIQUE: Contiguous axial images were obtained from the base of the skull through the vertex without intravenous contrast.  COMPARISON: None.  FINDINGS: Negative for acute intracranial hemorrhage, acute infarction, mass, mass effect, hydrocephalus or midline shift. Gray-white differentiation is preserved throughout. 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 No focal soft  tissue or calvarial abnormality. Globes and orbits are unremarkable bilaterally. Normal aeration of the mastoid air cells and visualized paranasal sinuses. Atherosclerotic calcifications in both cavernous carotid arteries.  IMPRESSION: 1. No acute intracranial abnormality. 2. 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 3. Intracranial atherosclerosis.   Electronically Signed By: Jacqulynn Cadet M.D. On: 08/20/2015 19:29          DG Chest Port 1 View (Final result) Result time: 08/20/15 18:48:46   Final result by Rad Results In Interface (08/20/15 18:48:46)   Narrative:   CLINICAL DATA: Altered mental status today, patient smokes  EXAM: PORTABLE CHEST 1 VIEW  COMPARISON: None.  FINDINGS: Mild cardiac silhouette enlargement. Vascular pattern normal. Right lung clear. Band of opacity laterally in the lingula appears most consistent with subsegmental atelectasis.  IMPRESSION: Mild subsegmental atelectasis in the lingula ; cannot exclude possibility that this may be due to central obstructing process although mediastinal and hilar contours appear within normal limits on this single AP view. Correlate clinically.      ____________________________________________   PROCEDURES  Procedure(s)  performed: None  Critical Care performed: Yes, see critical care note(s)  CRITICAL CARE Performed by: Delman Kitten   Total critical care time: 35 minutes  Critical care time was exclusive of separately billable procedures and treating other patients.  Critical care was necessary to treat or prevent imminent or life-threatening deterioration.  Critical care was time spent personally by me on the following activities: development of treatment plan with patient and/or surrogate as well as nursing, discussions with consultants, evaluation of patient's response to treatment, examination of patient, obtaining history from patient or surrogate, ordering and performing treatments and interventions, ordering and review of laboratory studies, ordering and review of radiographic studies, pulse oximetry and re-evaluation of patient's condition.  Patient presents with significant fever, tachycardia and leukocytosis. Initiate early goal-directed therapy for possible sepsis with early and altered mental status. Early antibiotics, admission to hospital. ____________________________________________   INITIAL IMPRESSION / ASSESSMENT AND PLAN / ED COURSE  Pertinent labs & imaging results that were available during my care of the patient were reviewed by me and considered in my medical decision making (see chart for details).  Patient is with altered mental status, noted significant fever. No focal findings on exam that shows general signs of delirium and somewhat somnolent. Concern for the possibility patient may have been down for some time as family had not seen her for over 24 hours. Given her significant fever I am concerned about the possibility of sepsis with associated altered mental status. Initiate early antibiotic therapy as we continue to sort out source.  She has no focal findings, but given her confusion and poor ability of history we will obtain CT imaging of the head, also chest x-ray she does  have some very mild hypoxia however does seem to have good inadequate respirations with clear lung sounds after DuoNeb. Patient also has a history of seizure disorder, we'll send Dilantin level.  ----------------------------------------- 8:18 PM on 08/20/2015 -----------------------------------------  Patient more alert, still somewhat confused. Limit the hospital for further workup, significant rhabdomyolysis though she does have notably significant low potassium, will also give aspirin rectally along with Tylenol. ____________________________________________   FINAL CLINICAL IMPRESSION(S) / ED DIAGNOSES  Final diagnoses:  Fever, unspecified fever cause  SIRS (systemic inflammatory response syndrome) (HCC)  Hypoxia  Non-traumatic rhabdomyolysis  Disorientation      Delman Kitten, MD 08/20/15 2248

## 2015-08-20 NOTE — Progress Notes (Signed)
ANTIBIOTIC CONSULT NOTE - INITIAL  Pharmacy Consult for Vancomycin/Zosyn  Indication: rule out sepsis  No Known Allergies  Patient Measurements: Height: 5\' 2"  (157.5 cm) Weight: 155 lb (70.308 kg) IBW/kg (Calculated) : 50.1 Adjusted Body Weight: 58.2 kg   Vital Signs: Temp: 101 F (38.3 C) (12/19 2159) Temp Source: Oral (12/19 2159) BP: 141/68 mmHg (12/19 2100) Pulse Rate: 103 (12/19 2100) Intake/Output from previous day:   Intake/Output from this shift:    Labs:  Recent Labs  08/20/15 1829  WBC 13.5*  HGB 14.0  PLT 179  CREATININE 0.97   Estimated Creatinine Clearance: 54.5 mL/min (by C-G formula based on Cr of 0.97). No results for input(s): VANCOTROUGH, VANCOPEAK, VANCORANDOM, GENTTROUGH, GENTPEAK, GENTRANDOM, TOBRATROUGH, TOBRAPEAK, TOBRARND, AMIKACINPEAK, AMIKACINTROU, AMIKACIN in the last 72 hours.   Microbiology: No results found for this or any previous visit (from the past 720 hour(s)).  Medical History: Past Medical History  Diagnosis Date  . Seizures ([place])   . Thyroid disease   . Coronary artery disease   . Hypertension   . Hyperlipidemia     Medications:   (Not in a hospital admission) Assessment: No Pseudomonas risk factors noted.  CrCl = 54.5 ml/min Ke = 0.05 hr-1 T1/2 = 13.9 hrs Vd = 40.7 L   Goal of Therapy:  Vancomycin trough level 15-20 mcg/ml  Plan:  Expected duration 7 days with resolution of temperature and/or normalization of WBC   Zosyn 3.375 gm IV Q8H EI ordered to start 12/20 @ 4:00.  Vancomycin 1 gm IV X 1 12/19 @ 20:00. Vancomycin 1 gm IV Q18H ordered to start 12/20 @ 7:00, ~ 11 hrs after 1st dose (stacked dosing). This pt will reach Css by 12/22 @ 20:00. Will draw 1st trough on 12/22 @ 12:30, which will be close to Css.   Elleanna Melling D 08/20/2015,10:10 PM

## 2015-08-20 NOTE — Progress Notes (Addendum)
ANTICOAGULATION CONSULT NOTE - Initial Consult  Pharmacy Consult for Heparin  Indication: chest pain/ACS  No Known Allergies  Patient Measurements: Height: 5\' 2"  (157.5 cm) Weight: 155 lb (70.308 kg) IBW/kg (Calculated) : 50.1 Heparin Dosing Weight: 64.9 kg   Vital Signs: Temp: 104.2 F (40.1 C) (12/19 1814) Temp Source: Oral (12/19 1814) BP: 141/68 mmHg (12/19 2100) Pulse Rate: 103 (12/19 2100)  Labs:  Recent Labs  08/20/15 1829  HGB 14.0  HCT 42.7  PLT 179  CREATININE 0.97  CKTOTAL 60454*  TROPONINI 0.62*    Estimated Creatinine Clearance: 54.5 mL/min (by C-G formula based on Cr of 0.97).   Medical History: Past Medical History  Diagnosis Date  . Seizures ([place])   . Thyroid disease   . Coronary artery disease   . Hypertension   . Hyperlipidemia     Medications:   (Not in a hospital admission)  Assessment: Pharmacy consulted to dose heparin in this 64 year old female admitted with ACS/NSTEMI . No prior anticoag noted.   Goal of Therapy:  Heparin level 0.3-0.7 units/ml Monitor platelets by anticoagulation protocol: Yes   Plan:  Give 3900 units bolus x 1 Start heparin infusion at 800 units/hr.  Will order HL 6 hrs after start of drip on 12/20 @ 4:00.  Will order baseline aptt and INR.   Caspar Favila D 08/20/2015,9:46 PM

## 2015-08-21 ENCOUNTER — Inpatient Hospital Stay
Admit: 2015-08-21 | Discharge: 2015-08-21 | Disposition: A | Discharge status: Home / Self Care | Payer: Self-pay | Attending: Internal Medicine | Admitting: Internal Medicine

## 2015-08-21 ENCOUNTER — Inpatient Hospital Stay: Payer: Self-pay

## 2015-08-21 ENCOUNTER — Inpatient Hospital Stay: Payer: MEDICAID

## 2015-08-21 DIAGNOSIS — M6282 Rhabdomyolysis: Secondary | ICD-10-CM | POA: Insufficient documentation

## 2015-08-21 DIAGNOSIS — A4151 Sepsis due to Escherichia coli [E. coli]: Principal | ICD-10-CM

## 2015-08-21 DIAGNOSIS — R0902 Hypoxemia: Secondary | ICD-10-CM | POA: Insufficient documentation

## 2015-08-21 DIAGNOSIS — R509 Fever, unspecified: Secondary | ICD-10-CM | POA: Insufficient documentation

## 2015-08-21 DIAGNOSIS — R410 Disorientation, unspecified: Secondary | ICD-10-CM

## 2015-08-21 DIAGNOSIS — T796XXA Traumatic ischemia of muscle, initial encounter: Secondary | ICD-10-CM

## 2015-08-21 DIAGNOSIS — I5030 Unspecified diastolic (congestive) heart failure: Secondary | ICD-10-CM

## 2015-08-21 DIAGNOSIS — I2510 Atherosclerotic heart disease of native coronary artery without angina pectoris: Secondary | ICD-10-CM | POA: Insufficient documentation

## 2015-08-21 LAB — BLOOD CULTURE ID PANEL (REFLEXED)
ACINETOBACTER BAUMANNII: NOT DETECTED
CANDIDA ALBICANS: NOT DETECTED
CANDIDA GLABRATA: NOT DETECTED
CANDIDA KRUSEI: NOT DETECTED
CANDIDA PARAPSILOSIS: NOT DETECTED
CANDIDA TROPICALIS: NOT DETECTED
Carbapenem resistance: NOT DETECTED
ENTEROBACTER CLOACAE COMPLEX: NOT DETECTED
ENTEROBACTERIACEAE SPECIES: NOT DETECTED
Enterococcus species: NOT DETECTED
Escherichia coli: DETECTED — AB
HAEMOPHILUS INFLUENZAE: NOT DETECTED
KLEBSIELLA OXYTOCA: NOT DETECTED
KLEBSIELLA PNEUMONIAE: NOT DETECTED
Listeria monocytogenes: NOT DETECTED
Methicillin resistance: NOT DETECTED
Neisseria meningitidis: NOT DETECTED
Proteus species: NOT DETECTED
Pseudomonas aeruginosa: NOT DETECTED
STREPTOCOCCUS AGALACTIAE: NOT DETECTED
STREPTOCOCCUS PNEUMONIAE: NOT DETECTED
Serratia marcescens: NOT DETECTED
Staphylococcus aureus (BCID): NOT DETECTED
Staphylococcus species: NOT DETECTED
Streptococcus pyogenes: NOT DETECTED
Streptococcus species: NOT DETECTED
Vancomycin resistance: NOT DETECTED

## 2015-08-21 LAB — BASIC METABOLIC PANEL
ANION GAP: 9 (ref 5–15)
BUN: 17 mg/dL (ref 6–20)
CALCIUM: 7.3 mg/dL — AB (ref 8.9–10.3)
CHLORIDE: 105 mmol/L (ref 101–111)
CO2: 23 mmol/L (ref 22–32)
Creatinine, Ser: 1 mg/dL (ref 0.44–1.00)
GFR calc non Af Amer: 59 mL/min — ABNORMAL LOW (ref >60)
GLUCOSE: 90 mg/dL (ref 65–99)
Potassium: 3.8 mmol/L (ref 3.5–5.1)
Sodium: 137 mmol/L (ref 135–145)

## 2015-08-21 LAB — PHENYTOIN LEVEL, TOTAL: Phenytoin Lvl: 25 ug/mL — ABNORMAL HIGH (ref 10.0–20.0)

## 2015-08-21 LAB — CBC
HEMATOCRIT: 36.2 % (ref 35.0–47.0)
HEMOGLOBIN: 11.7 g/dL — AB (ref 12.0–16.0)
MCH: 31.5 pg (ref 26.0–34.0)
MCHC: 32.2 g/dL (ref 32.0–36.0)
MCV: 97.7 fL (ref 80.0–100.0)
Platelets: 161 10*3/uL (ref 150–440)
RBC: 3.7 MIL/uL — AB (ref 3.80–5.20)
RDW: 14 % (ref 11.5–14.5)
WBC: 10.4 10*3/uL (ref 3.6–11.0)

## 2015-08-21 LAB — CK: Total CK: 13018 U/L — ABNORMAL HIGH (ref 38–234)

## 2015-08-21 LAB — TROPONIN I
TROPONIN I: 0.69 ng/mL — AB (ref <0.031)
Troponin I: 0.6 ng/mL — ABNORMAL HIGH (ref <0.031)
Troponin I: 0.46 ng/mL — ABNORMAL HIGH (ref <0.031)

## 2015-08-21 IMAGING — CR DG CHEST 1V
1 series · 1 of 1 positions shown · non-contrast
Comparison: Study obtained earlier in the day.

CLINICAL DATA: Repositioning of central catheter

EXAM:
CHEST 1 VIEW

[ap]
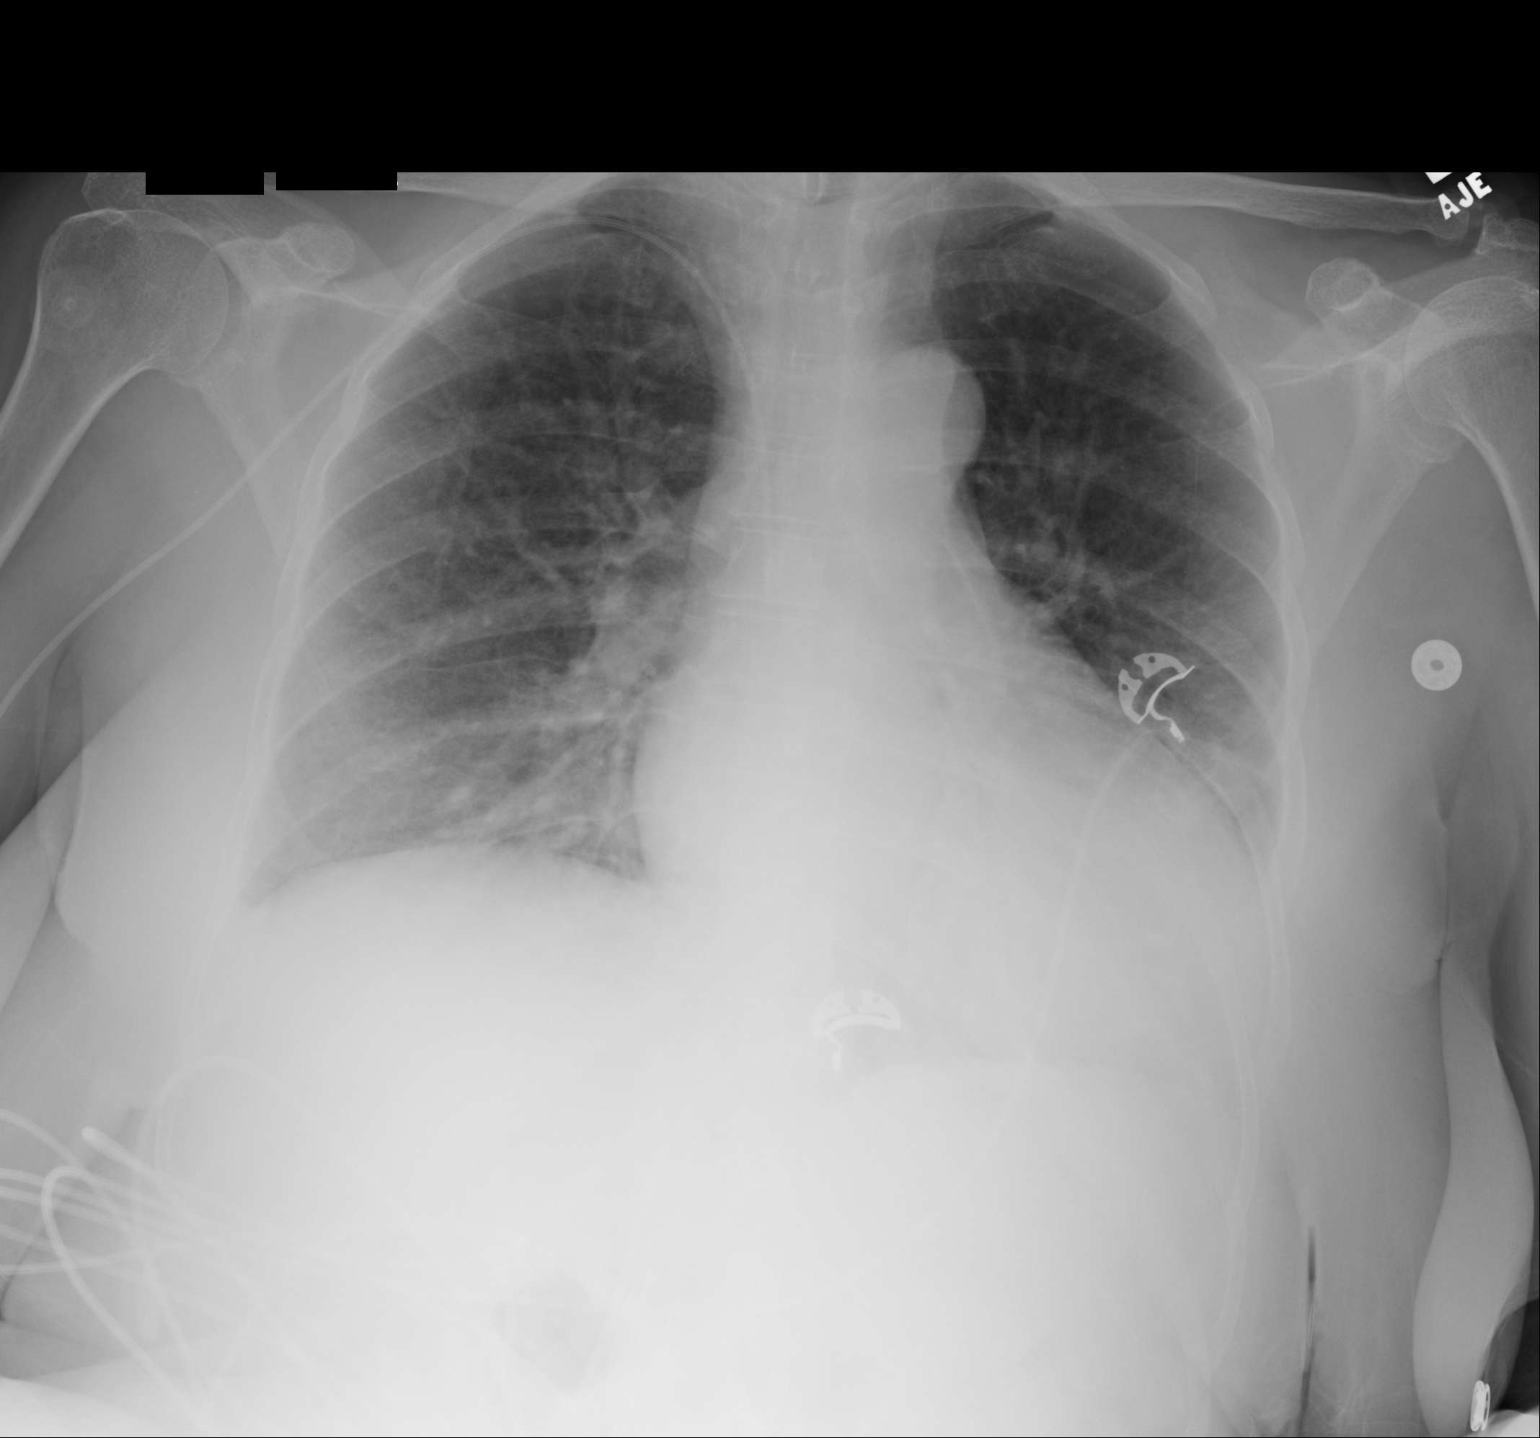

[1 of 1 positions shown; findings below may reference images not displayed]

FINDINGS: Central catheter tip is now in the superior vena cava slightly
proximal to the cavoatrial junction. No pneumothorax. Atelectasis
remains in the lingula. Lungs elsewhere clear. Heart is prominent
bus table. No adenopathy. No bone lesions.
IMPRESSION: Stable lingular atelectasis. No new opacity. No change in cardiac
silhouette. Central catheter tip is now in the superior vena cava.
No pneumothorax.

## 2015-08-21 IMAGING — CR DG CHEST 1V PORT
1 series · 1 of 1 positions shown · non-contrast
Comparison: [DATE]

CLINICAL DATA: Central catheter placement

EXAM:
PORTABLE CHEST 1 VIEW

[ap]
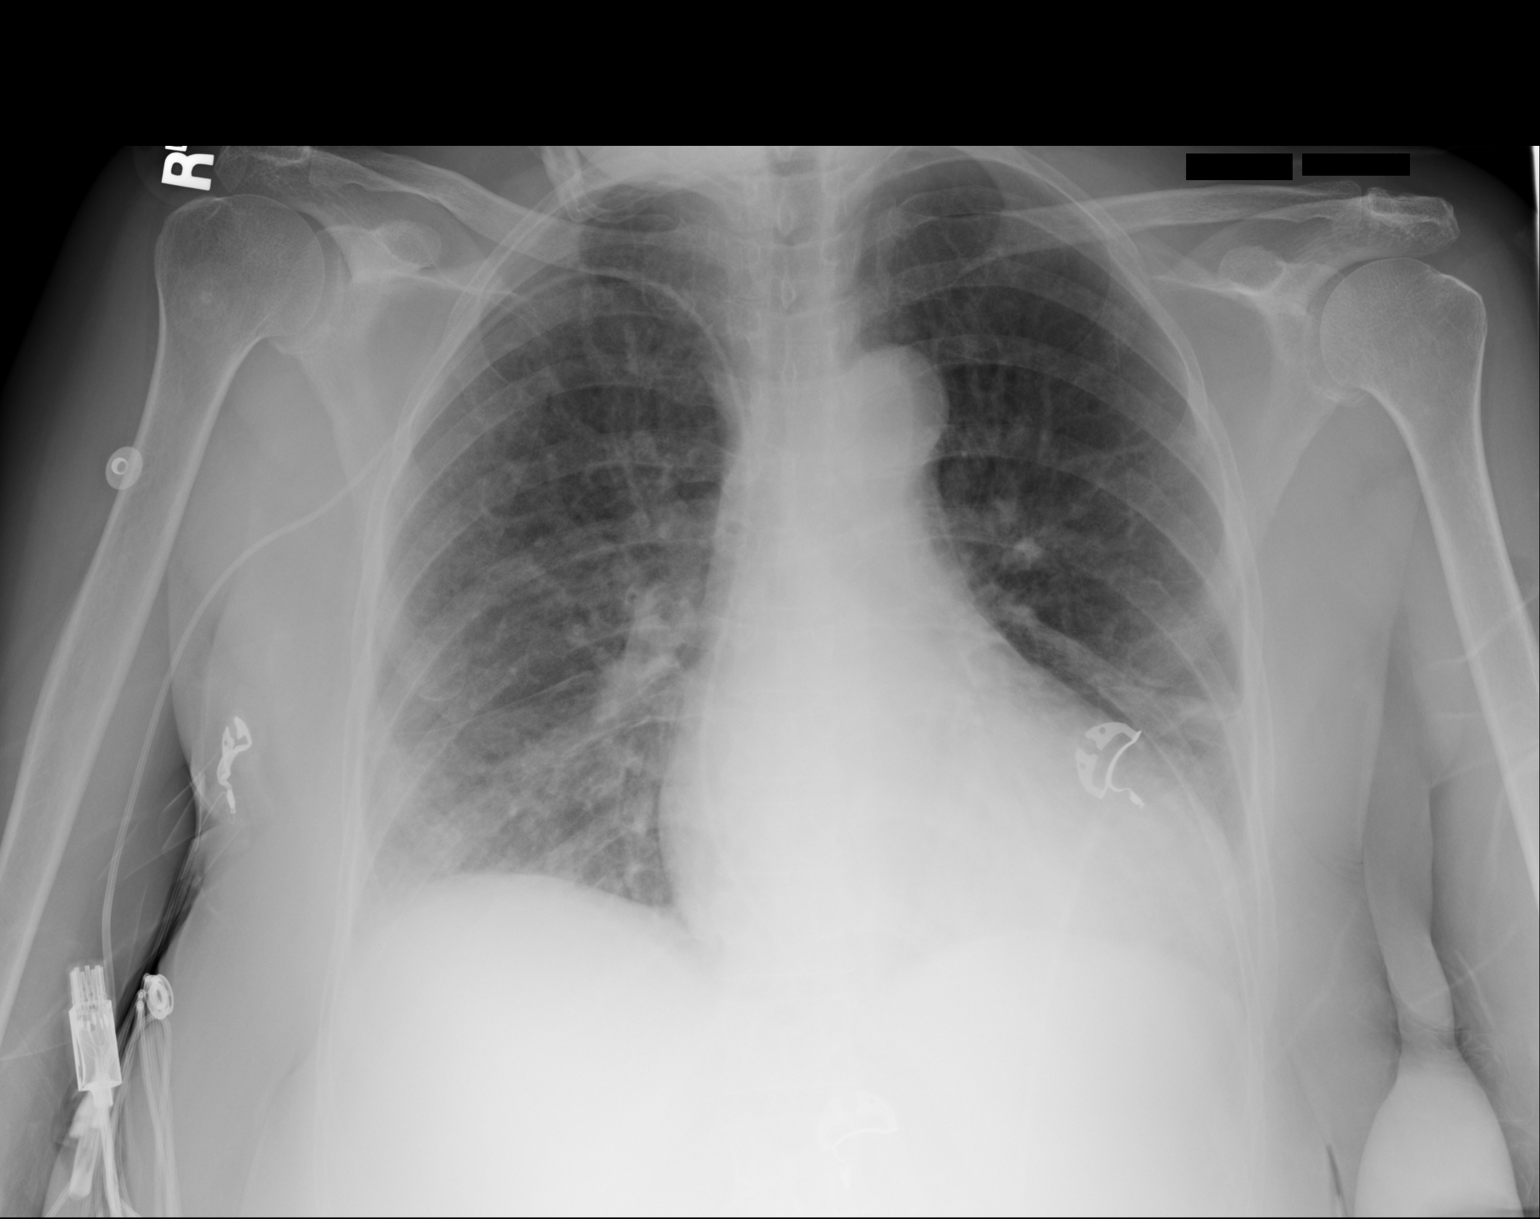

[1 of 1 positions shown; findings below may reference images not displayed]

FINDINGS: Central catheter tip is in the right atrium, approximately 8 cm
distal to the cavoatrial junction. No pneumothorax. There is
persistent atelectasis in the inferior lingula. The lungs elsewhere
clear. Heart is slightly enlarged but stable. The pulmonary
vascularity is normal. No adenopathy. No bone lesions.
IMPRESSION: Central catheter tip in right atrium. No pneumothorax. Persistent
lingular atelectatic change. No new opacity in the lungs. Stable
mild cardiac enlargement.

## 2015-08-21 MED ORDER — NITROGLYCERIN 0.4 MG SL SUBL: 0.4000 mg | SUBLINGUAL_TABLET | SUBLINGUAL | Status: DC | PRN

## 2015-08-21 MED ORDER — ENOXAPARIN SODIUM 40 MG/0.4ML ~~LOC~~ SOLN
40.0000 mg | SUBCUTANEOUS | Status: DC
  Administered 2015-08-21 – 2015-08-28 (×8): 40 mg via SUBCUTANEOUS
  Filled 2015-08-21 (×8): qty 0.4

## 2015-08-21 MED ORDER — IPRATROPIUM-ALBUTEROL 0.5-2.5 (3) MG/3ML IN SOLN: 3.0000 mL | RESPIRATORY_TRACT | Status: DC | PRN

## 2015-08-21 MED ORDER — CLONAZEPAM 1 MG PO TABS
1.0000 mg | ORAL_TABLET | Freq: Two times a day (BID) | ORAL | Status: DC
  Administered 2015-08-21 – 2015-08-28 (×15): 1 mg via ORAL
  Filled 2015-08-21 (×15): qty 1

## 2015-08-21 MED ORDER — SODIUM CHLORIDE 0.9 % IV SOLN
1.0000 g | Freq: Three times a day (TID) | INTRAVENOUS | Status: DC
  Administered 2015-08-21 – 2015-08-23 (×6): 1 g via INTRAVENOUS
  Filled 2015-08-21 (×8): qty 1

## 2015-08-21 MED ORDER — GABAPENTIN 600 MG PO TABS
300.0000 mg | ORAL_TABLET | Freq: Three times a day (TID) | ORAL | Status: DC
  Administered 2015-08-21 – 2015-08-28 (×21): 300 mg via ORAL
  Filled 2015-08-21: qty 1
  Filled 2015-08-21: qty 2
  Filled 2015-08-21 (×8): qty 1
  Filled 2015-08-21: qty 0.5
  Filled 2015-08-21 (×2): qty 1
  Filled 2015-08-21: qty 0.5
  Filled 2015-08-21 (×3): qty 1
  Filled 2015-08-21: qty 2
  Filled 2015-08-21: qty 1
  Filled 2015-08-21 (×2): qty 0.5
  Filled 2015-08-21 (×2): qty 1

## 2015-08-21 MED ORDER — ASPIRIN EC 81 MG PO TBEC
81.0000 mg | DELAYED_RELEASE_TABLET | Freq: Every day | ORAL | Status: DC
  Administered 2015-08-21 – 2015-08-28 (×8): 81 mg via ORAL
  Filled 2015-08-21 (×8): qty 1

## 2015-08-21 MED ORDER — LEVETIRACETAM 500 MG PO TABS
1000.0000 mg | ORAL_TABLET | Freq: Every day | ORAL | Status: DC
  Administered 2015-08-21 – 2015-08-27 (×7): 1000 mg via ORAL
  Filled 2015-08-21 (×8): qty 2

## 2015-08-21 MED ORDER — GABAPENTIN 300 MG PO CAPS: 300.0000 mg | ORAL_CAPSULE | Freq: Three times a day (TID) | ORAL | Status: DC

## 2015-08-21 MED ORDER — LEVETIRACETAM 500 MG PO TABS: 500.0000 mg | ORAL_TABLET | Freq: Two times a day (BID) | ORAL | Status: DC

## 2015-08-21 MED ORDER — LEVETIRACETAM 500 MG PO TABS
500.0000 mg | ORAL_TABLET | Freq: Every morning | ORAL | Status: DC
  Administered 2015-08-21 – 2015-08-28 (×8): 500 mg via ORAL
  Filled 2015-08-21 (×7): qty 1

## 2015-08-21 MED ORDER — PHENYTOIN SODIUM EXTENDED 100 MG PO CAPS: 300.0000 mg | ORAL_CAPSULE | Freq: Every day | ORAL | Status: DC

## 2015-08-21 NOTE — Progress Notes (Signed)
*  PRELIMINARY RESULTS* Echocardiogram 2D Echocardiogram has been performed.  Laqueta Jean Hege 08/21/2015, 4:27 PM

## 2015-08-21 NOTE — Progress Notes (Signed)
Pt more alert since admission. Afebrile. Swallow is good. Pt taking po fluids well. picc line placed by [facility] vascular. Pt tolerated procedure well. Pt c/o having seizures. No loss of consciousness. Appears to have twitching of face and hands. Med twice for generalized aching. Echocardiogram done.

## 2015-08-21 NOTE — Care Management (Signed)
Patient said she fell at home and called her sister.  It was not a mechanical fall- says her legs just gave way.  She does not think it was a seizure.  Says she did have to leave a message for her sister.   She does not know how long it took her sister to get to her.    It is reported that patient demonstrates noncompliance with medication regime.  She does not have insurance and says she has not applied for disability.  She is followed by Dr Celesta Aver in [place] office.  She thinks the office is [place] which is a part of [place].  She says that she does not have problems obtaining her medications- [facility] has a program.  She is not clear.  Will consult her sister Alyson Reedy to clarify/confirm.  PT consult is pending.  Patient admitted with possible seizure, sepsis, nstemi and rhabdo.

## 2015-08-21 NOTE — Progress Notes (Signed)
ANTIBIOTIC CONSULT NOTE - INITIAL  Pharmacy Consult for Meropenem  Indication: bacteremia   No Known Allergies  Patient Measurements: Height: 5\' 2"  (157.5 cm) Weight: 144 lb 14.4 oz (65.726 kg) IBW/kg (Calculated) : 50.1  Vital Signs: Temp: 97.5 F (36.4 C) (12/20 0514) Temp Source: Oral (12/20 0514) BP: 128/68 mmHg (12/20 0514) Pulse Rate: 72 (12/20 0514) Intake/Output from previous day: 12/19 0701 - 12/20 0700 In: -  Out: 350 [Urine:350]  Recent Labs  08/20/15 1829 08/21/15 0737  WBC 13.5*  --   HGB 14.0  --   PLT 179  --   CREATININE 0.97 1.00   Estimated Creatinine Clearance: 51.2 mL/min (by C-G formula based on Cr of 1). No results for input(s): VANCOTROUGH, VANCOPEAK, VANCORANDOM, GENTTROUGH, GENTPEAK, GENTRANDOM, TOBRATROUGH, TOBRAPEAK, TOBRARND, AMIKACINPEAK, AMIKACINTROU, AMIKACIN in the last 72 hours.   Microbiology: Recent Results (from the past 720 hour(s))  Culture, blood (Routine X 2) w Reflex to ID Panel     Status: None (Preliminary result)   Collection Time: 08/20/15  6:47 PM  Result Value Ref Range Status   Specimen Description BLOOD RIGHT ASSIST CONTROL  Final   Special Requests   Final    BOTTLES DRAWN AEROBIC AND ANAEROBIC [place] AERO 5 CC ANA   Culture  Setup Time   Final    GRAM NEGATIVE RODS IN BOTH AEROBIC AND ANAEROBIC BOTTLES CRITICAL RESULT CALLED TO, READ BACK BY AND VERIFIED WITH: CHRISTINE KATSOUDAS AT [id number] ON 08/21/15 CTJ    Culture   Final    ESCHERICHIA COLI IN BOTH AEROBIC AND ANAEROBIC BOTTLES SUSCEPTIBILITIES TO FOLLOW    Report Status PENDING  Incomplete  Blood Culture ID Panel (Reflexed)     Status: Abnormal   Collection Time: 08/20/15  6:47 PM  Result Value Ref Range Status   Enterococcus species NOT DETECTED NOT DETECTED Final   Listeria monocytogenes NOT DETECTED NOT DETECTED Final   Staphylococcus species NOT DETECTED NOT DETECTED Final   Staphylococcus aureus NOT DETECTED NOT DETECTED Final   Streptococcus species  NOT DETECTED NOT DETECTED Final   Streptococcus agalactiae NOT DETECTED NOT DETECTED Final   Streptococcus pneumoniae NOT DETECTED NOT DETECTED Final   Streptococcus pyogenes NOT DETECTED NOT DETECTED Final   Acinetobacter baumannii NOT DETECTED NOT DETECTED Final   Enterobacteriaceae species NOT DETECTED NOT DETECTED Final   Enterobacter cloacae complex NOT DETECTED NOT DETECTED Final   Escherichia coli DETECTED (A) NOT DETECTED Final    Comment: CRITICAL RESULT CALLED TO AND VERIFIED BY: CHRISTINE KATSOUDAS AT 0925 ON 08/21/15 CTJ   Klebsiella oxytoca NOT DETECTED NOT DETECTED Final   Klebsiella pneumoniae NOT DETECTED NOT DETECTED Final   Proteus species NOT DETECTED NOT DETECTED Final   Serratia marcescens NOT DETECTED NOT DETECTED Final   Haemophilus influenzae NOT DETECTED NOT DETECTED Final   Neisseria meningitidis NOT DETECTED NOT DETECTED Final   Pseudomonas aeruginosa NOT DETECTED NOT DETECTED Final   Candida albicans NOT DETECTED NOT DETECTED Final   Candida glabrata NOT DETECTED NOT DETECTED Final   Candida krusei NOT DETECTED NOT DETECTED Final   Candida parapsilosis NOT DETECTED NOT DETECTED Final   Candida tropicalis NOT DETECTED NOT DETECTED Final   Carbapenem resistance NOT DETECTED NOT DETECTED Final   Methicillin resistance NOT DETECTED NOT DETECTED Final   Vancomycin resistance NOT DETECTED NOT DETECTED Final   Medical History: Past Medical History  Diagnosis Date  . Seizures ([place])   . Thyroid disease   . Coronary artery disease   .  Hypertension   . Hyperlipidemia    Assessment: 64 yo female with PMH of seizure disorder and CAD presented to ED on 12/19 with AMS. [facility] BCx ID shows E.Coli in both aerobic and anaerobic bottles. Zosyn and Vancomycin was started in ED on 12/19, now discontinued.   WBC: 10.4       Procalcitonin: 7.9      CK: 13018   Tmax: 104    CrCl: 51.2    Scr: 1mg /dL   Kg: 65.7       Plan:  [facility] pre-set recommendations suggest  meropenem IV 2gm q8h, but patient currently has elevated CK. Therefore will start patient on meropenem IV 1gm q8h. Will continue to monitor cultures and susceptibilities, and recommend narrowing when possible.   Pharmacy will continue to monitor labs and renal function and make adjustments as needed.    Nancy Fetter, PharmD Pharmacy Resident  08/21/2015,10:02 AM

## 2015-08-21 NOTE — Progress Notes (Signed)
[facility] [place] NAME: Anne Phillips    MR#:  [id number]  DATE OF BIRTH:  November 22, 1950  SUBJECTIVE:  CHIEF COMPLAINT:   Chief Complaint  Patient presents with  . Altered Mental Status   -Patient admitted with lethargy and noted to be septic. -Blood cultures with gram-negative rods. Likely source urine. -Is arousable, appears confused still. Continues to spike fevers as high as 104F. -troponin elevated. -Complains of diffuse muscle aches  REVIEW OF SYSTEMS:  Review of Systems  Constitutional: Positive for malaise/fatigue. Negative for fever and chills.  HENT: Negative for ear discharge, ear pain and tinnitus.   Eyes: Negative for blurred vision.  Respiratory: Negative for cough, shortness of breath and wheezing.   Cardiovascular: Negative for chest pain, palpitations and leg swelling.  Gastrointestinal: Negative for nausea, vomiting, abdominal pain, diarrhea and constipation.  Genitourinary: Negative for dysuria.  Musculoskeletal: Positive for myalgias.  Neurological: Positive for seizures and loss of consciousness. Negative for dizziness, sensory change, speech change and headaches.  Psychiatric/Behavioral: Negative for depression.    DRUG ALLERGIES:  No Known Allergies  VITALS:  Blood pressure 131/67, pulse 111, temperature 103.2 F (39.6 C), temperature source Oral, resp. rate 21, height 5\' 2"  (1.575 m), weight 65.726 kg (144 lb 14.4 oz), SpO2 97 %.  PHYSICAL EXAMINATION:  Physical Exam  GENERAL:  64 y.o.-year-old patient lying in the bed with no acute distress.  EYES: Pupils equal, round, reactive to light and accommodation. No scleral icterus. Extraocular muscles intact.  HEENT: Head atraumatic, normocephalic. Oropharynx and nasopharynx clear.  NECK:  Supple, no jugular venous distention. No thyroid enlargement, no tenderness.  LUNGS: Normal breath sounds bilaterally, no wheezing, rales,rhonchi or crepitation. No  use of accessory muscles of respiration. Decreased bibasilar breath sounds. CARDIOVASCULAR: S1, S2 normal. No  rubs, or gallops. 3/6 systolic murmur present ABDOMEN: Soft, nontender, nondistended. Bowel sounds present. No organomegaly or mass.  EXTREMITIES: No pedal edema, cyanosis, or clubbing.  NEUROLOGIC: Cranial nerves II through XII are intact. Muscle strength 5/5 in all extremities. Sensation intact. Gait not checked.  PSYCHIATRIC: The patient is alert and oriented x 2. Confused but oriented to place and person. SKIN: No obvious rash, lesion, or ulcer.    LABORATORY PANEL:   CBC  Recent Labs Lab 08/21/15 0950  WBC 10.4  HGB 11.7*  HCT 36.2  PLT 161   ------------------------------------------------------------------------------------------------------------------  Chemistries   Recent Labs Lab 08/20/15 1829 08/21/15 0737  NA 134* 137  K 2.5* 3.8  CL 91* 105  CO2 31 23  GLUCOSE 109* 90  BUN 17 17  CREATININE 0.97 1.00  CALCIUM 8.3* 7.3*  AST 159*  --   ALT 19  --   ALKPHOS 121  --   BILITOT 1.0  --    ------------------------------------------------------------------------------------------------------------------  Cardiac Enzymes  Recent Labs Lab 08/21/15 0737  TROPONINI 0.60*   ------------------------------------------------------------------------------------------------------------------  RADIOLOGY:  Dg Chest 1 View  08/21/2015  CLINICAL DATA:  Repositioning of central catheter EXAM: CHEST 1 VIEW COMPARISON:  Study obtained earlier in the day. FINDINGS: Central catheter tip is now in the superior vena cava slightly proximal to the cavoatrial junction. No pneumothorax. Atelectasis remains in the lingula. Lungs elsewhere clear. Heart is prominent bus table. No adenopathy. No bone lesions. IMPRESSION: Stable lingular atelectasis. No new opacity. No change in cardiac silhouette. Central catheter tip is now in the superior vena cava. No pneumothorax.  Electronically Signed   By: Lowella Grip III M.D.  On: 08/21/2015 13:50   Ct Head Wo Contrast  08/20/2015  CLINICAL DATA:  64 year old female with altered mental status EXAM: CT HEAD WITHOUT CONTRAST TECHNIQUE: Contiguous axial images were obtained from the base of the skull through the vertex without intravenous contrast. COMPARISON:  None. FINDINGS: Negative for acute intracranial hemorrhage, acute infarction, mass, mass effect, hydrocephalus or midline shift. Gray-white differentiation is preserved throughout. 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 No focal soft tissue or calvarial abnormality. Globes and orbits are unremarkable bilaterally. Normal aeration of the mastoid air cells and visualized paranasal sinuses. Atherosclerotic calcifications in both cavernous carotid arteries. IMPRESSION: 1. No acute intracranial abnormality. 2. 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 3. Intracranial atherosclerosis. Electronically Signed   By: Jacqulynn Cadet M.D.   On: 08/20/2015 19:29   Dg Chest Port 1 View  08/21/2015  CLINICAL DATA:  Central catheter placement EXAM: PORTABLE CHEST 1 VIEW COMPARISON:  August 30, 2015 FINDINGS: Central catheter tip is in the right atrium, approximately 8 cm distal to the cavoatrial junction. No pneumothorax. There is persistent atelectasis in the inferior lingula. The lungs elsewhere clear. Heart is slightly enlarged but stable. The pulmonary vascularity is normal. No adenopathy. No bone lesions. IMPRESSION: Central catheter tip in right atrium. No pneumothorax. Persistent lingular atelectatic change. No new opacity in the lungs. Stable mild cardiac enlargement. Electronically Signed   By: Lowella Grip III M.D.   On: 08/21/2015 12:45   Dg Chest Port 1 View  08/20/2015  CLINICAL DATA:  Altered mental status today, patient smokes EXAM: PORTABLE CHEST 1 VIEW  COMPARISON:  None. FINDINGS: Mild cardiac silhouette enlargement. Vascular pattern normal. Right lung clear. Band of opacity laterally in the lingula appears most consistent with subsegmental atelectasis. IMPRESSION: Mild subsegmental atelectasis in the lingula ; cannot exclude possibility that this may be due to central obstructing process although mediastinal and hilar contours appear within normal limits on this single AP view. Correlate clinically. Electronically Signed   By: Skipper Cliche M.D.   On: 08/20/2015 18:48    EKG:   Orders placed or performed during the hospital encounter of 08/20/15  . ED EKG  . ED EKG  . EKG 12-Lead  . EKG 12-Lead    ASSESSMENT AND PLAN:   64 year old female with past medical history significant for coronary artery disease status post stents, hypertension, seizure disorder, ongoing smoking presents to the hospital secondary to fall. Noted to be septic and also has rhabdomyolysis.  #1 sepsis- likely source urine. -Patient continues to have fevers. Continue antibiotics. -Follow blood and urine cultures. -Blood cultures growing gram-negative rods. So antibiotics changed to meropenem until ESBL sensitivities known. -Discontinue vancomycin. -Blood pressure is stable at this time  #2 elevated troponins-likely demand ischemia. Troponins have plateaued. -No chest pain. Also from rhabdomyolysis. -Follow up echo. Cardiology consulted. -Discontinue IV heparin.  #3 rhabdomyolysis-likely from fall. Patient not sure if she had a seizure prior to the fall or not. Has been having multiple falls lately. -We will have physical therapy evaluation. -IV fluids and continue to monitor CK levels.  #4 seizure disorder-known history of seizure disorder -Follows with [facility] 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 She is taking 3 tablets of 300 mg of Dilantin at bedtime. Though according to his note she is  only supposed to take 1 tablet at bedtime. -Her Dilantin levels were toxic, so discontinue at this  time. -Continue her Keppra. -Neurology consult requested. -She is also on Neurontin 3 times a day.  #5 hypokalemia-replaced aggressively and potassium normalized.  #6 metabolic encephalopathy-likely from her sepsis and also elevated Dilantin level. -Continues to improve at this point. -CT of the head negative for any acute findings. -Continue to monitor.  #7 DVT prophylaxis-on subcutaneous Lovenox   Physical therapy consult.   All the records are reviewed and case discussed with Care Management/Social Workerr. Management plans discussed with the patient, family and they are in agreement.  CODE STATUS: Full code  TOTAL TIME TAKING CARE OF THIS PATIENT: 42 minutes.   POSSIBLE D/C IN 2-3 DAYS, DEPENDING ON CLINICAL CONDITION.   Gladstone Lighter M.D on 08/21/2015 at 3:03 PM  Between 7am to 6pm - Pager - [phone]  After 6pm go to [email or web address] - password [email or web address] [facility]  Office  [phone]  CC: Primary care physician; Pcp Not In System

## 2015-08-21 NOTE — Consult Note (Signed)
Cardiology Consultation Note  Patient ID: Anne Phillips, MRN: [id number], DOB/AGE: 63-Nov-1952 63 y.o. Admit date: 08/20/2015   Date of Consult: 08/21/2015 Primary Physician: Pcp Not In System Primary Cardiologist: [facility]  Chief Complaint: AMS Reason for Consult: Elevated troponin  HPI: 64 y.o. female with a history of CAD with inferior ST elevation MI on 09/04/2012 status post PCI/DEX x 2 to RCA on 09/04/2012, tobacco abuse, hypertension, medication noncompliance and epilepsy who was admitted to [facility] on 12/19 with GNR sepsis and rhabdomyolysis.  Patient presented to [facility] on 09/04/2012 with chest pressure/discomfort. On presentation, she was found to have ST elevations in the inferior leads with reciprocal changes in the lateral leads. She was initially rushed to cardiac cath lab when she was found to have 99% RCA occlusion. She received 2 drug-eluting stents to her RCA. She had an echocardiogram at that time that showed preserved ejection fraction to 70% with moderate-to-severe LVH.   Patient has been being seen by her PCP her an increased number of falls and overall decline over the past several months along with increased number of seizures. History is taken from patient and prior notes. Patient reports over the past several days her overall decline became significantly worse prompting her to be brought in by family. She did not have any chest pain, SOB, palpitations, nausea, vomiting, diaphoresis, presyncope, or syncope.   Upon the patient's arrival to [facility] they were found to have troponin 0.62-->0.60, CK 20,287-->13,018, blood cultures growing GNR-->E coli, WBC 13.5-->10.4, K+ 2.5-->3.8, procalcitonin 7.90, influenza negative. ECG showed sinus tachycardia, 106 bpm, LVH, nonspecific st/t changes, CXR showed mild subsegmental atelectasis in the lingula; cannot exclude possibility this may be 2/2 central obstructing process.   Past Medical History  Diagnosis Date  . Seizures ([place])   .  Thyroid disease   . Coronary artery disease   . Hypertension   . Hyperlipidemia       Most Recent Cardiac Studies: TEE 09/06/2012: Left ventricular hypertrophy (moderate to severe) Supernormal left ventricular ejection fraction 70%. Diastolic left ventricular dysfuction. Elevated left ventricularfilling pressures. Dilated left atrium. Dilated thoracic aorta. Normal right ventricular contractile performance. Percardial effusion. The thoracic aorta is mildly dilated measuring 2.1 cm at the left ventricular outflow tract, 3.8 am at the sinuses of valsalva, 2.8cm above the sinotubular junction. There is relatively echo-free space consistent with small pericardial effusion.   Cardiac cath 09/04/2012: On cath was found to have 99% occlusion of the RCA s/p PCI/DES x 2   Surgical History: History reviewed. No pertinent past surgical history.   Home Meds: Prior to Admission medications   Medication Sig Start Date End Date Taking? Authorizing Provider  clonazePAM (KLONOPIN) 1 MG tablet Take 1 mg by mouth 2 (two) times daily.   Yes Historical Provider, MD  gabapentin (NEURONTIN) 300 MG capsule Take 300 mg by mouth 3 (three) times daily.   Yes Historical Provider, MD  levETIRAcetam (KEPPRA) 500 MG tablet Take 500-1,000 mg by mouth 2 (two) times daily. Take 1 tablet every morning and 2 tablets every evening.   Yes Historical Provider, MD  phenytoin (DILANTIN) 100 MG ER capsule Take 300 mg by mouth at bedtime.   Yes Historical Provider, MD    Inpatient Medications:  . aspirin EC  81 mg Oral Daily  . levETIRAcetam  500 mg Oral q morning - 10a   And  . levETIRAcetam  1,000 mg Oral QHS  . meropenem (MERREM) IV  1 g Intravenous 3 times per day  .  sodium chloride  3 mL Intravenous Q12H   . 0.9 % NaCl with KCl 20 mEq / L 100 mL/hr at 08/21/15 [id number]    Allergies: No Known Allergies  Social History   Social History  . Marital Status: Single    Spouse Name: N/A  . Number of Children: N/A  .  Years of Education: N/A   Occupational History  . Not on file.   Social History Main Topics  . Smoking status: Current Every Day Smoker -- 1.00 packs/day    Types: Cigarettes  . Smokeless tobacco: Never Used  . Alcohol Use: No  . Drug Use: Not on file  . Sexual Activity: Not on file   Other Topics Concern  . Not on file   Social History Narrative  . No narrative on file     Family History  Problem Relation Age of Onset  . Hypertension Other      Review of Systems: Review of Systems  Constitutional: Positive for chills and malaise/fatigue. Negative for fever, weight loss and diaphoresis.  HENT: Negative for congestion.   Eyes: Negative for discharge and redness.  Respiratory: Positive for cough, sputum production and shortness of breath. Negative for hemoptysis and wheezing.   Cardiovascular: Negative for chest pain, palpitations, orthopnea, claudication, leg swelling and PND.  Gastrointestinal: Negative for nausea, vomiting and abdominal pain.  Musculoskeletal: Positive for falls.  Skin: Negative for rash.  Neurological: Positive for seizures and weakness. Negative for dizziness, sensory change, speech change, focal weakness and loss of consciousness.  Endo/Heme/Allergies: Does not bruise/bleed easily.  Psychiatric/Behavioral: The patient is not nervous/anxious.   All other systems reviewed and are negative.    Labs:  Recent Labs  08/20/15 1829 08/21/15 0737  CKTOTAL 16109* 13018*  TROPONINI 0.62* 0.60*   Lab Results  Component Value Date   WBC 10.4 08/21/2015   HGB 11.7* 08/21/2015   HCT 36.2 08/21/2015   MCV 97.7 08/21/2015   PLT 161 08/21/2015    Recent Labs Lab 08/20/15 1829 08/21/15 0737  NA 134* 137  K 2.5* 3.8  CL 91* 105  CO2 31 23  BUN 17 17  CREATININE 0.97 1.00  CALCIUM 8.3* 7.3*  PROT 8.3*  --   BILITOT 1.0  --   ALKPHOS 121  --   ALT 19  --   AST 159*  --   GLUCOSE 109* 90   No results found for: CHOL, HDL, LDLCALC, TRIG No  results found for: DDIMER  Radiology/Studies:  Ct Head Wo Contrast  08/20/2015  CLINICAL DATA:  64 year old female with altered mental status EXAM: CT HEAD WITHOUT CONTRAST TECHNIQUE: Contiguous axial images were obtained from the base of the skull through the vertex without intravenous contrast. COMPARISON:  None. FINDINGS: Negative for acute intracranial hemorrhage, acute infarction, mass, mass effect, hydrocephalus or midline shift. Gray-white differentiation is preserved throughout. 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 No focal soft tissue or calvarial abnormality. Globes and orbits are unremarkable bilaterally. Normal aeration of the mastoid air cells and visualized paranasal sinuses. Atherosclerotic calcifications in both cavernous carotid arteries. IMPRESSION: 1. No acute intracranial abnormality. 2. 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 3. Intracranial atherosclerosis. Electronically Signed   By: Jacqulynn Cadet M.D.   On: 08/20/2015 19:29   Dg Chest Port 1 View  08/20/2015  CLINICAL DATA:  Altered mental status today, patient smokes EXAM: PORTABLE CHEST 1 VIEW COMPARISON:  None. FINDINGS: Mild cardiac silhouette enlargement. Vascular  pattern normal. Right lung clear. Band of opacity laterally in the lingula appears most consistent with subsegmental atelectasis. IMPRESSION: Mild subsegmental atelectasis in the lingula ; cannot exclude possibility that this may be due to central obstructing process although mediastinal and hilar contours appear within normal limits on this single AP view. Correlate clinically. Electronically Signed   By: Skipper Cliche M.D.   On: 08/20/2015 18:48    EKG: sinus tachycardia, 106 bpm, LVH, nonspecific st/t changes  Weights: [facility] Weights   08/20/15 2116 08/21/15 0152  Weight: 155 lb (70.308 kg) 144 lb 14.4 oz (65.726 kg)     Physical Exam: Blood  pressure 128/68, pulse 72, temperature 97.5 F (36.4 C), temperature source Oral, resp. rate 18, height 5\' 2"  (1.575 m), weight 144 lb 14.4 oz (65.726 kg), SpO2 100 %. Body mass index is 26.5 kg/(m^2). General: Well developed, well nourished, in no acute distress. Head: Normocephalic, atraumatic, sclera non-icteric, no xanthomas, nares are without discharge.  Neck: Negative for carotid bruits. JVD not elevated. Lungs: Clear bilaterally to auscultation without wheezes, rales, or rhonchi. Breathing is unlabored. Heart: RRR with S1 S2. No murmurs, rubs, or gallops appreciated. Abdomen: Obese, soft, non-tender, non-distended with normoactive bowel sounds. No hepatomegaly. No rebound/guarding. No obvious abdominal masses. Msk:  Strength and tone appear normal for age. Extremities: No clubbing or cyanosis. No edema.  Distal pedal pulses are 2+ and equal bilaterally. Neuro: Alert and oriented X 3. No facial asymmetry. No focal deficit. Moves all extremities spontaneously. Psych:  Responds to questions appropriately with a normal affect.    Assessment and Plan:   1. Elevated troponin: -Mildly elevated at 0.62-->0.60 likely demand ischemia in the setting of the patient's GNR bacteremia/sepsis and rhabdomyolysis of 20,000 -She has never had any symptoms concerning for chest pain -Echo pending to evaluate LV function and wall motion -Could pursue outpatient Lexiscan Myoview once she improves from her infection   2. GNR bacteremia/sepsis: -On ABX per IM -Likely driving #1  3. Rhabdo: -Improving with IV fluids  4. CAD s/p remote PCI as above: -Continue aspirin 81 mg daily -Could restart Lopressor when able -No chest pain  4. Hypokalemia: -Improving -Replete to 4.0  5. HTN: -Per IM   6. Seizure disorder: -Per IM -Possibly leading to her overall decline?   SignedChristell Faith, PA-C Pager: [phone] 08/21/2015, 10:34 AM

## 2015-08-22 LAB — BASIC METABOLIC PANEL
ANION GAP: 5 (ref 5–15)
BUN: 10 mg/dL (ref 6–20)
CALCIUM: 7 mg/dL — AB (ref 8.9–10.3)
CO2: 25 mmol/L (ref 22–32)
CREATININE: 0.72 mg/dL (ref 0.44–1.00)
Chloride: 103 mmol/L (ref 101–111)
Glucose, Bld: 105 mg/dL — ABNORMAL HIGH (ref 65–99)
Potassium: 3.4 mmol/L — ABNORMAL LOW (ref 3.5–5.1)
SODIUM: 133 mmol/L — AB (ref 135–145)

## 2015-08-22 LAB — CBC
HCT: 32.1 % — ABNORMAL LOW (ref 35.0–47.0)
HEMOGLOBIN: 10.5 g/dL — AB (ref 12.0–16.0)
MCH: 31.7 pg (ref 26.0–34.0)
MCHC: 32.7 g/dL (ref 32.0–36.0)
MCV: 97 fL (ref 80.0–100.0)
PLATELETS: 126 10*3/uL — AB (ref 150–440)
RBC: 3.31 MIL/uL — AB (ref 3.80–5.20)
RDW: 14 % (ref 11.5–14.5)
WBC: 6.2 10*3/uL (ref 3.6–11.0)

## 2015-08-22 LAB — PROLACTIN: PROLACTIN: 11.1 ng/mL (ref 4.8–23.3)

## 2015-08-22 LAB — CK: CK TOTAL: 7586 U/L — AB (ref 38–234)

## 2015-08-22 LAB — LEVETIRACETAM LEVEL: Levetiracetam Lvl: 17 ug/mL (ref 10.0–40.0)

## 2015-08-22 MED ORDER — PHENYTOIN SODIUM EXTENDED 100 MG PO CAPS
300.0000 mg | ORAL_CAPSULE | Freq: Every day | ORAL | Status: DC
  Administered 2015-08-23: 300 mg via ORAL
  Filled 2015-08-22: qty 3

## 2015-08-22 NOTE — NC FL2 (Signed)
[place] LEVEL OF CARE SCREENING TOOL     IDENTIFICATION  Patient Name: Anne Phillips Birthdate: Jun 19, 1951 Sex: female Admission Date (Current Location): 08/20/2015  [place] and [place] Number:  Engineering geologist and Address:  [facility], [address] [place], [place] [address]      Provider Number: [id number]  Attending Physician Name and Address:  Gladstone Lighter, MD  Relative Name and Phone Number:       Current Level of Care: Hospital Recommended Level of Care: [place] Prior Approval Number:    Date Approved/Denied:   PASRR Number:  ([id number])  Discharge Plan: SNF    Current Diagnoses: Patient Active Problem List   Diagnosis Date Noted  . (HFpEF) heart failure with preserved ejection fraction ([place])   . Disorientation   . Pyrexia   . Hypoxia   . Non-traumatic rhabdomyolysis   . Coronary artery disease involving native coronary artery of native heart without angina pectoris   . Sepsis ([place]) 08/20/2015  . NSTEMI (non-ST elevated myocardial infarction) ([place]) 08/20/2015  . Rhabdomyolysis 08/20/2015    Orientation RESPIRATION BLADDER Height & Weight    Self, Situation, Place  O2 (2 Liters Oxygen ) Continent 5\' 2"  (157.5 cm) 144 lbs.  BEHAVIORAL SYMPTOMS/MOOD NEUROLOGICAL BOWEL NUTRITION STATUS   (none )  (none ) Incontinent Diet (Diet: Soft )  AMBULATORY STATUS COMMUNICATION OF NEEDS Skin   Extensive Assist Verbally Normal                       Personal Care Assistance Level of Assistance  Bathing, Feeding, Dressing Bathing Assistance: Limited assistance Feeding assistance: Independent Dressing Assistance: Limited assistance     Functional Limitations Info  Sight, Hearing, Speech Sight Info: Adequate Hearing Info: Adequate Speech Info: Adequate    SPECIAL CARE FACTORS FREQUENCY  PT (By licensed PT)     PT Frequency:  (3)              Contractures       Additional Factors Info  Code Status Code Status Info:  (Full Code. )             Current Medications (08/22/2015):  This is the current hospital active medication list Current Facility-Administered Medications  Medication Dose Route Frequency Provider Last Rate Last Dose  . 0.9 % NaCl with KCl 20 mEq/ L  infusion   Intravenous Continuous Lytle Butte, MD 100 mL/hr at 08/21/15 [id number]    . acetaminophen (TYLENOL) tablet 650 mg  650 mg Oral Q6H PRN Lytle Butte, MD   650 mg at 08/21/15 2008   Or  . acetaminophen (TYLENOL) suppository 650 mg  650 mg Rectal Q6H PRN Lytle Butte, MD      . aspirin EC tablet 81 mg  81 mg Oral Daily Lytle Butte, MD   81 mg at 08/22/15 [id number]  . clonazePAM (KLONOPIN) tablet 1 mg  1 mg Oral BID Gladstone Lighter, MD   1 mg at 08/22/15 0939  . enoxaparin (LOVENOX) injection 40 mg  40 mg Subcutaneous Q24H Gladstone Lighter, MD   40 mg at 08/22/15 1215  . gabapentin (NEURONTIN) tablet 300 mg  300 mg Oral TID Gladstone Lighter, MD   300 mg at 08/22/15 [id number]  . hydrALAZINE (APRESOLINE) injection 10 mg  10 mg Intravenous Q4H PRN Lytle Butte, MD      . ipratropium-albuterol (DUONEB) 0.5-2.5 (3) MG/3ML nebulizer solution 3 mL  3  mL Nebulization Q4H PRN Lytle Butte, MD      . levETIRAcetam (KEPPRA) tablet 500 mg  500 mg Oral q morning - 10a Lytle Butte, MD   500 mg at 08/22/15 [id number]   And  . levETIRAcetam (KEPPRA) tablet 1,000 mg  1,000 mg Oral QHS Lytle Butte, MD   1,000 mg at 08/21/15 2243  . meropenem (MERREM) 1 g in sodium chloride 0.9 % 100 mL IVPB  1 g Intravenous 3 times per day Pernell Dupre, RPH   1 g at 08/22/15 1416  . morphine 2 MG/ML injection 2 mg  2 mg Intravenous Q4H PRN Lytle Butte, MD   2 mg at 08/21/15 1442  . nitroGLYCERIN (NITROSTAT) SL tablet 0.4 mg  0.4 mg Sublingual Q5 min PRN Lytle Butte, MD      . ondansetron [facility]) tablet 4 mg  4 mg Oral Q6H PRN Lytle Butte, MD       Or  . ondansetron [facility]) injection 4 mg  4 mg  Intravenous Q6H PRN Lytle Butte, MD      . oxyCODONE (Oxy IR/ROXICODONE) immediate release tablet 5 mg  5 mg Oral Q4H PRN Lytle Butte, MD      . sodium chloride 0.9 % injection 3 mL  3 mL Intravenous Q12H Lytle Butte, MD   3 mL at 08/22/15 0940     Discharge Medications: Please see discharge summary for a list of discharge medications.  Relevant Imaging Results:  Relevant Lab Results:   Additional Information  (SSN: [id number])  Loralyn Freshwater, LCSW

## 2015-08-22 NOTE — Clinical Social Work Note (Signed)
Clinical Social Work Assessment  Patient Details  Name: Anne Phillips MRN: [id number] Date of Birth: 1951-06-08  Date of referral:  08/22/15               Reason for consult:                   Permission sought to share information with:  Facility Sport and exercise psychologist, Family Supports Permission granted to share information::  Yes, Verbal Permission Granted  Name::      (sister Anne Phillips)  Agency::     Relationship::     Contact Information:     Housing/Transportation Living arrangements for the past 2 months:  Single Family Home Source of Information:  Patient, Medical Team Patient Interpreter Needed:  None Criminal Activity/Legal Involvement Pertinent to Current Situation/Hospitalization:    Significant Relationships:  Siblings Lives with:  Self Do you feel safe going back to the place where you live?    Need for family participation in patient care:     Care giving concerns:  None at this time   Facilities manager / plan:  Holiday representative (CSW) consult for SNF placement, PT is recommending STR to assist with getting patient back to previous level of functioning.   Patient was alert, oriented min engaged in conversation with CSW. .  CSW introduced self and explained role of CSW department.    Patient currently lives alone, has no children.  Support system is her sister Anne Phillips. CSW explained that PT is recommending rehab.  Patient has never been to SNF. CSW reviewed SNF process with patient, Patient is agreeable to SNF search. CSW explained that the she does not have payer source and CSW will try to assist with a LOG.  Patient is willing to stay 30-days and understand that the facility may not be in [place].   CSW will complete FL2, explore LOG options, and fax out for available bed in anticipation of discharging to SNF for rehab.   Employment status:    Insurance information:  Self Pay (Medicaid Pending) PT Recommendations:  [place] / Referral to community resources:  [place]  Patient/Family's Response to care:  Patient was appreciative of information provided by CSW.  Patient/Family's Understanding of and Emotional Response to Diagnosis, Current Treatment, and Prognosis:  Patient understands that she is under continued medical work up at this time.  Once medically stable she will discharge discharge to SNF with LOG or possible home with home health.  Emotional Assessment Appearance:  Appears stated age Attitude/Demeanor/Rapport:  Lethargic Affect (typically observed):  Accepting, Adaptable, Hopeful, Flat, Irritable, Calm Orientation:  Oriented to Self, Oriented to Place, Oriented to  Time, Oriented to Situation Alcohol / Substance use:  Tobacco Use Psych involvement (Current and /or in the community):  No (Comment)  Discharge Needs  Concerns to be addressed:  Care Coordination, Discharge Planning Concerns, Lack of Support Readmission within the last 30 days:  No Current discharge risk:  Chronically ill, Dependent with Mobility, Lives alone, Inadequate Financial Supports, Lack of support system Barriers to Discharge:  Continued Medical Work up, Inadequate or no insurance   Maurine Cane, LCSW 08/22/2015, 1:10 PM

## 2015-08-22 NOTE — Progress Notes (Signed)
[facility] [place] NAME: Anne Phillips    MR#:  [id number]  DATE OF BIRTH:  Jan 02, 1951  SUBJECTIVE:  CHIEF COMPLAINT:   Chief Complaint  Patient presents with  . Altered Mental Status   -No seizures noted in the hospital here. Dilantin level now improved back to normal. -Patient is alert, but extremely weak. Physical therapy recommended rehabilitation. Continues to spike low-grade fevers.  REVIEW OF SYSTEMS:  Review of Systems  Constitutional: Positive for fever, chills and malaise/fatigue.  HENT: Negative for ear discharge, ear pain and tinnitus.   Eyes: Negative for blurred vision.  Respiratory: Negative for cough, shortness of breath and wheezing.   Cardiovascular: Negative for chest pain, palpitations and leg swelling.  Gastrointestinal: Negative for nausea, vomiting, abdominal pain, diarrhea and constipation.  Genitourinary: Negative for dysuria.  Musculoskeletal: Positive for myalgias.  Neurological: Negative for dizziness, sensory change, speech change, seizures and headaches.  Psychiatric/Behavioral: Negative for depression.    DRUG ALLERGIES:  No Known Allergies  VITALS:  Blood pressure 155/70, pulse 95, temperature 99.8 F (37.7 C), temperature source Oral, resp. rate 20, height 5\' 2"  (1.575 m), weight 65.726 kg (144 lb 14.4 oz), SpO2 100 %.  PHYSICAL EXAMINATION:  Physical Exam  GENERAL:  64 y.o.-year-old patient lying in the bed with no acute distress.  EYES: Pupils equal, round, reactive to light and accommodation. No scleral icterus. Extraocular muscles intact.  HEENT: Head atraumatic, normocephalic. Oropharynx and nasopharynx clear.  NECK:  Supple, no jugular venous distention. No thyroid enlargement, no tenderness.  LUNGS: Normal breath sounds bilaterally, no wheezing, rales,rhonchi or crepitation. No use of accessory muscles of respiration. Decreased bibasilar breath sounds. CARDIOVASCULAR: S1, S2  normal. No  rubs, or gallops. 3/6 systolic murmur present ABDOMEN: Soft, nontender, nondistended. Bowel sounds present. No organomegaly or mass.  EXTREMITIES: No pedal edema, cyanosis, or clubbing.  NEUROLOGIC: Cranial nerves II through XII are intact. Muscle strength 5/5 in all extremities. Sensation intact. Gait not checked.  PSYCHIATRIC: The patient is alert and oriented x 2. Confused but oriented to place and person. SKIN: No obvious rash, lesion, or ulcer.    LABORATORY PANEL:   CBC  Recent Labs Lab 08/22/15 1405  WBC 6.2  HGB 10.5*  HCT 32.1*  PLT 126*   ------------------------------------------------------------------------------------------------------------------  Chemistries   Recent Labs Lab 08/20/15 1829  08/22/15 1405  NA 134*  < > 133*  K 2.5*  < > 3.4*  CL 91*  < > 103  CO2 31  < > 25  GLUCOSE 109*  < > 105*  BUN 17  < > 10  CREATININE 0.97  < > 0.72  CALCIUM 8.3*  < > 7.0*  AST 159*  --   --   ALT 19  --   --   ALKPHOS 121  --   --   BILITOT 1.0  --   --   < > = values in this interval not displayed. ------------------------------------------------------------------------------------------------------------------  Cardiac Enzymes  Recent Labs Lab 08/21/15 1730  TROPONINI 0.69*   ------------------------------------------------------------------------------------------------------------------  RADIOLOGY:  Dg Chest 1 View  08/21/2015  CLINICAL DATA:  Repositioning of central catheter EXAM: CHEST 1 VIEW COMPARISON:  Study obtained earlier in the day. FINDINGS: Central catheter tip is now in the superior vena cava slightly proximal to the cavoatrial junction. No pneumothorax. Atelectasis remains in the lingula. Lungs elsewhere clear. Heart is prominent bus table. No adenopathy. No bone lesions. IMPRESSION: Stable lingular atelectasis. No new opacity.  No change in cardiac silhouette. Central catheter tip is now in the superior vena cava. No  pneumothorax. Electronically Signed   By: Lowella Grip III M.D.   On: 08/21/2015 13:50   Ct Head Wo Contrast  08/20/2015  CLINICAL DATA:  64 year old female with altered mental status EXAM: CT HEAD WITHOUT CONTRAST TECHNIQUE: Contiguous axial images were obtained from the base of the skull through the vertex without intravenous contrast. COMPARISON:  None. FINDINGS: Negative for acute intracranial hemorrhage, acute infarction, mass, mass effect, hydrocephalus or midline shift. Gray-white differentiation is preserved throughout. 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 No focal soft tissue or calvarial abnormality. Globes and orbits are unremarkable bilaterally. Normal aeration of the mastoid air cells and visualized paranasal sinuses. Atherosclerotic calcifications in both cavernous carotid arteries. IMPRESSION: 1. No acute intracranial abnormality. 2. 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 3. Intracranial atherosclerosis. Electronically Signed   By: Jacqulynn Cadet M.D.   On: 08/20/2015 19:29   Dg Chest Port 1 View  08/21/2015  CLINICAL DATA:  Central catheter placement EXAM: PORTABLE CHEST 1 VIEW COMPARISON:  August 30, 2015 FINDINGS: Central catheter tip is in the right atrium, approximately 8 cm distal to the cavoatrial junction. No pneumothorax. There is persistent atelectasis in the inferior lingula. The lungs elsewhere clear. Heart is slightly enlarged but stable. The pulmonary vascularity is normal. No adenopathy. No bone lesions. IMPRESSION: Central catheter tip in right atrium. No pneumothorax. Persistent lingular atelectatic change. No new opacity in the lungs. Stable mild cardiac enlargement. Electronically Signed   By: Lowella Grip III M.D.   On: 08/21/2015 12:45   Dg Chest Port 1 View  08/20/2015  CLINICAL DATA:  Altered mental status today, patient smokes EXAM: PORTABLE  CHEST 1 VIEW COMPARISON:  None. FINDINGS: Mild cardiac silhouette enlargement. Vascular pattern normal. Right lung clear. Band of opacity laterally in the lingula appears most consistent with subsegmental atelectasis. IMPRESSION: Mild subsegmental atelectasis in the lingula ; cannot exclude possibility that this may be due to central obstructing process although mediastinal and hilar contours appear within normal limits on this single AP view. Correlate clinically. Electronically Signed   By: Skipper Cliche M.D.   On: 08/20/2015 18:48    EKG:   Orders placed or performed during the hospital encounter of 08/20/15  . ED EKG  . ED EKG  . EKG 12-Lead  . EKG 12-Lead    ASSESSMENT AND PLAN:   64 year old female with past medical history significant for coronary artery disease status post stents, hypertension, seizure disorder, ongoing smoking presents to the hospital secondary to fall. Noted to be septic and also has rhabdomyolysis.  #1 sepsis- likely source urine. -Fevers have improved.  -Blood cultures growing Escherichia coli pending sensitivities. So antibiotics changed to meropenem until ESBL sensitivities known. - looks like urine cultures are not sent -Blood pressure is stable at this time  #2 elevated troponins-likely demand ischemia. Troponins have plateaued. -No chest pain. Also from rhabdomyolysis. -Follow up echo. Cardiology consulted. No further cardiac workup needed  #3 rhabdomyolysis-likely from fall. Patient not sure if she had a seizure prior to the fall or not. Has been having multiple falls lately. -Will need rehabilitation at discharge. Not safe to go back home by herself. -IV fluids and continue to monitor CK levels. CK level has improved from 20,000 to 7000 today  #4 seizure disorder-known history of seizure disorder -Follows with Dr. Celesta Aver at [facility] -Patient was taking increased  Dilantin at home then was advised. Dilantin level was elevated on admission. -Dilantin  was stopped and now level normalized. -Continue restart it tomorrow night. Patient has some jerks occasionally but think she has about 7-8 seizures per day at home. She needs to be seen at an epilepsy center. -Appreciate neurology consult. -Continue her keppra and Neurontin   #5 hypokalemia-being replaced.  #6 metabolic encephalopathy-likely from her sepsis and also elevated Dilantin level. -Continues to improve at this point. -CT of the head negative for any acute findings. -Continue to monitor.  #7 DVT prophylaxis-on subcutaneous Lovenox   Physical therapy consulted and they have recommended rehabilitation.   All the records are reviewed and case discussed with Care Management/Social Workerr. Management plans discussed with the patient, family and they are in agreement.  CODE STATUS: Full code  TOTAL TIME TAKING CARE OF THIS PATIENT: 42 minutes.   POSSIBLE D/C IN 1-2 DAYS, DEPENDING ON CLINICAL CONDITION.   Gladstone Lighter M.D on 08/22/2015 at 4:47 PM  Between 7am to 6pm - Pager - [phone]  After 6pm go to [email or web address] - password [email or web address] [facility]  Office  [phone]  CC: Primary care physician; Pcp Not In System

## 2015-08-22 NOTE — Clinical Social Work Placement (Signed)
   CLINICAL SOCIAL WORK PLACEMENT  NOTE  Date:  08/22/2015  Patient Details  Name: Anne Phillips MRN: [id number] Date of Birth: 07-15-1951  Clinical Social Work is seeking post-discharge placement for this patient at the [facility] level of care (*CSW will initial, date and re-position this form in  chart as items are completed):  Yes   Patient/family provided with [facility]'s list of facilities offering this level of care within the geographic area requested by the patient (or if unable, by the patient's family).  Yes   Patient/family informed of their freedom to choose among providers that offer the needed level of care, that participate in Medicare, Medicaid or managed care program needed by the patient, have an available bed and are willing to accept the patient.  Yes   Patient/family informed of [facility]'s ownership interest in [facility] and [facility], as well as of the fact that they are under no obligation to receive care at these facilities.  PASRR submitted to EDS on 08/22/15     PASRR number received on 08/22/15     Existing PASRR number confirmed on       FL2 transmitted to all facilities in geographic area requested by pt/family on 08/22/15     FL2 transmitted to all facilities within larger geographic area on       Patient informed that his/her managed care company has contracts with or will negotiate with certain facilities, including the following:            Patient/family informed of bed offers received.  Patient chooses bed at       Physician recommends and patient chooses bed at      Patient to be transferred to   on  .  Patient to be transferred to facility by       Patient family notified on   of transfer.  Name of family member notified:        PHYSICIAN       Additional Comment:    _______________________________________________ Loralyn Freshwater, LCSW 08/22/2015, 4:18 PM

## 2015-08-22 NOTE — Consult Note (Signed)
Reason for Consult: seizure Referring Physician:  Dr. Austin Miles is an 64 y.o. female.  HPI: 64 yo RHD F presents to [facility] due to AMS.  She was noted to have multiple fevers and started antibiotic therapy.  She has a past hx of seizure in which she is not willing to elaborate on.  She sees a neurologist at [facility] for therapy.  When she came here, her drug levels were high but there has not been any acute seizure noted.  Past Medical History  Diagnosis Date  . Seizures ([place])   . Thyroid disease   . Coronary artery disease   . Hypertension   . Hyperlipidemia     History reviewed. No pertinent past surgical history.  Family History  Problem Relation Age of Onset  . Hypertension Other     Social History:  reports that she has been smoking Cigarettes.  She has been smoking about 1.00 pack per day. She has never used smokeless tobacco. She reports that she does not drink alcohol. Her drug history is not on file.  Allergies: No Known Allergies  Medications: personally reviewed by me as per chart  Results for orders placed or performed during the hospital encounter of 08/20/15 (from the past 48 hour(s))  Lactic acid, plasma     Status: None   Collection Time: 08/20/15  5:05 PM  Result Value Ref Range   Lactic Acid, Venous 1.6 0.5 - 2.0 mmol/L  CBC     Status: Abnormal   Collection Time: 08/20/15  6:29 PM  Result Value Ref Range   WBC 13.5 (H) 3.6 - 11.0 K/uL   RBC 4.42 3.80 - 5.20 MIL/uL   Hemoglobin 14.0 12.0 - 16.0 g/dL   HCT 42.7 35.0 - 47.0 %   MCV 96.6 80.0 - 100.0 fL   MCH 31.7 26.0 - 34.0 pg   MCHC 32.8 32.0 - 36.0 g/dL   RDW 14.2 11.5 - 14.5 %   Platelets 179 150 - 440 K/uL  Comprehensive metabolic panel     Status: Abnormal   Collection Time: 08/20/15  6:29 PM  Result Value Ref Range   Sodium 134 (L) 135 - 145 mmol/L   Potassium 2.5 (LL) 3.5 - 5.1 mmol/L    Comment: CRITICAL RESULT CALLED TO, READ BACK BY AND VERIFIED WITH [facility] YUAL AT 1927 08/20/15  MLZ    Chloride 91 (L) 101 - 111 mmol/L   CO2 31 22 - 32 mmol/L   Glucose, Bld 109 (H) 65 - 99 mg/dL   BUN 17 6 - 20 mg/dL   Creatinine, Ser 0.97 0.44 - 1.00 mg/dL   Calcium 8.3 (L) 8.9 - 10.3 mg/dL   Total Protein 8.3 (H) 6.5 - 8.1 g/dL   Albumin 3.9 3.5 - 5.0 g/dL   AST 159 (H) 15 - 41 U/L   ALT 19 14 - 54 U/L   Alkaline Phosphatase 121 38 - 126 U/L   Total Bilirubin 1.0 0.3 - 1.2 mg/dL   GFR calc non Af Amer >60 >60 mL/min   GFR calc Af Amer >60 >60 mL/min    Comment: (NOTE) The eGFR has been calculated using the CKD EPI equation. This calculation has not been validated in all clinical situations. eGFR's persistently <60 mL/min signify possible Chronic Kidney Disease.    Anion gap 12 5 - 15  Lipase, blood     Status: None   Collection Time: 08/20/15  6:29 PM  Result Value Ref Range  Lipase 12 11 - 51 U/L  Troponin I     Status: Abnormal   Collection Time: 08/20/15  6:29 PM  Result Value Ref Range   Troponin I 0.62 (H) <0.031 ng/mL    Comment: READ BACK AND VERIFIED WITH [facility] YUAL AT 1927 08/20/15 MLZ        POSSIBLE MYOCARDIAL ISCHEMIA. SERIAL TESTING RECOMMENDED.   CK     Status: Abnormal   Collection Time: 08/20/15  6:29 PM  Result Value Ref Range   [place] [address] (H) 38 - 234 U/L    Comment: RESULT CONFIRMED BY MANUAL DILUTION  Culture, blood (Routine X 2) w Reflex to ID Panel     Status: None (Preliminary result)   Collection Time: 08/20/15  6:30 PM  Result Value Ref Range   Specimen Description BLOOD LEFT ASSIST CONTROL    Special Requests BOTTLES DRAWN AEROBIC AND ANAEROBIC 1CC    Culture NO GROWTH 2 DAYS    Report Status PENDING   Phenytoin level, total     Status: Abnormal   Collection Time: 08/20/15  6:30 PM  Result Value Ref Range   Phenytoin Lvl 31.2 (HH) 10.0 - 20.0 ug/mL    Comment: CRITICAL RESULT CALLED TO, READ BACK BY AND VERIFIED WITH KUCH YUAL ON 08/20/15 AT 2055PM BY TB.   Culture, blood (Routine X 2) w Reflex to ID Panel     Status:  None (Preliminary result)   Collection Time: 08/20/15  6:47 PM  Result Value Ref Range   Specimen Description BLOOD RIGHT ASSIST CONTROL    Special Requests      BOTTLES DRAWN AEROBIC AND ANAEROBIC [place] AERO 5 CC ANA   Culture  Setup Time      GRAM NEGATIVE RODS IN BOTH AEROBIC AND ANAEROBIC BOTTLES CRITICAL RESULT CALLED TO, READ BACK BY AND VERIFIED WITH: CHRISTINE KATSOUDAS AT 9798 ON 08/21/15 CTJ    Culture      ESCHERICHIA COLI IN BOTH AEROBIC AND ANAEROBIC BOTTLES SUSCEPTIBILITIES TO FOLLOW    Report Status PENDING   Blood Culture ID Panel (Reflexed)     Status: Abnormal   Collection Time: 08/20/15  6:47 PM  Result Value Ref Range   Enterococcus species NOT DETECTED NOT DETECTED   Listeria monocytogenes NOT DETECTED NOT DETECTED   Staphylococcus species NOT DETECTED NOT DETECTED   Staphylococcus aureus NOT DETECTED NOT DETECTED   Streptococcus species NOT DETECTED NOT DETECTED   Streptococcus agalactiae NOT DETECTED NOT DETECTED   Streptococcus pneumoniae NOT DETECTED NOT DETECTED   Streptococcus pyogenes NOT DETECTED NOT DETECTED   Acinetobacter baumannii NOT DETECTED NOT DETECTED   Enterobacteriaceae species NOT DETECTED NOT DETECTED   Enterobacter cloacae complex NOT DETECTED NOT DETECTED   Escherichia coli DETECTED (A) NOT DETECTED    Comment: CRITICAL RESULT CALLED TO AND VERIFIED BY: Wilhoit AT 0925 ON 08/21/15 CTJ   Klebsiella oxytoca NOT DETECTED NOT DETECTED   Klebsiella pneumoniae NOT DETECTED NOT DETECTED   Proteus species NOT DETECTED NOT DETECTED   Serratia marcescens NOT DETECTED NOT DETECTED   Haemophilus influenzae NOT DETECTED NOT DETECTED   Neisseria meningitidis NOT DETECTED NOT DETECTED   Pseudomonas aeruginosa NOT DETECTED NOT DETECTED   Candida albicans NOT DETECTED NOT DETECTED   Candida glabrata NOT DETECTED NOT DETECTED   Candida krusei NOT DETECTED NOT DETECTED   Candida parapsilosis NOT DETECTED NOT DETECTED   Candida tropicalis  NOT DETECTED NOT DETECTED   Carbapenem resistance NOT DETECTED NOT DETECTED   Methicillin  resistance NOT DETECTED NOT DETECTED   Vancomycin resistance NOT DETECTED NOT DETECTED  Influenza panel by PCR (type A & B, H1N1)     Status: None   Collection Time: 08/20/15  9:29 PM  Result Value Ref Range   Influenza A By PCR NEGATIVE NEGATIVE   Influenza B By PCR NEGATIVE NEGATIVE   H1N1 flu by pcr NOT DETECTED NOT DETECTED    Comment:        The Xpert Flu assay (FDA approved for nasal aspirates or washes and nasopharyngeal swab specimens), is intended as an aid in the diagnosis of influenza and should not be used as a sole basis for treatment.   Prolactin     Status: None   Collection Time: 08/20/15 10:02 PM  Result Value Ref Range   Prolactin 11.1 4.8 - 23.3 ng/mL    Comment: (NOTE) Performed At: [facility] [place], [place] [id number] Lindon Romp MD [id number]   Procalcitonin     Status: None   Collection Time: 08/20/15 10:02 PM  Result Value Ref Range   Procalcitonin 7.90 ng/mL    Comment:        Interpretation: PCT > 2 ng/mL: Systemic infection (sepsis) is likely, unless other causes are known. (NOTE)         ICU PCT Algorithm               Non ICU PCT Algorithm    ----------------------------     ------------------------------         PCT < 0.25 ng/mL                 PCT < 0.1 ng/mL     Stopping of antibiotics            Stopping of antibiotics       strongly encouraged.               strongly encouraged.    ----------------------------     ------------------------------       PCT level decrease by               PCT < 0.25 ng/mL       >= 80% from peak PCT       OR PCT 0.25 - 0.5 ng/mL          Stopping of antibiotics                                             encouraged.     Stopping of antibiotics           encouraged.    ----------------------------     ------------------------------       PCT level decrease by              PCT >=  0.25 ng/mL       < 80% from peak PCT        AND PCT >= 0.5 ng/mL            Continuing antibiotics                                               encouraged.       Continuing antibiotics  encouraged.    ----------------------------     ------------------------------     PCT level increase compared          PCT > 0.5 ng/mL         with peak PCT AND          PCT >= 0.5 ng/mL             Escalation of antibiotics                                          strongly encouraged.      Escalation of antibiotics        strongly encouraged.   Urinalysis complete, with microscopic ([facility] only)     Status: Abnormal   Collection Time: 08/20/15 10:03 PM  Result Value Ref Range   Color, Urine YELLOW (A) YELLOW   APPearance CLOUDY (A) CLEAR   Glucose, UA NEGATIVE NEGATIVE mg/dL   Bilirubin Urine NEGATIVE NEGATIVE   Ketones, ur TRACE (A) NEGATIVE mg/dL   Specific Gravity, Urine 1.013 1.005 - 1.030   Hgb urine dipstick 3+ (A) NEGATIVE   pH 5.0 5.0 - 8.0   Protein, ur 100 (A) NEGATIVE mg/dL   Nitrite POSITIVE (A) NEGATIVE   Leukocytes, UA 2+ (A) NEGATIVE   RBC / HPF TOO NUMEROUS TO COUNT 0 - 5 RBC/hpf   WBC, UA TOO NUMEROUS TO COUNT 0 - 5 WBC/hpf   Bacteria, UA RARE (A) NONE SEEN   Squamous Epithelial / LPF NONE SEEN NONE SEEN   WBC Clumps PRESENT    Mucous PRESENT    Amorphous Crystal PRESENT   Lactic acid, plasma     Status: None   Collection Time: 08/20/15 10:03 PM  Result Value Ref Range   Lactic Acid, Venous 1.0 0.5 - 2.0 mmol/L  Protime-INR     Status: Abnormal   Collection Time: 08/20/15 10:03 PM  Result Value Ref Range   Prothrombin Time 20.6 (H) 11.4 - 15.0 seconds   INR 1.77   APTT     Status: Abnormal   Collection Time: 08/20/15 10:03 PM  Result Value Ref Range   aPTT >160 (HH) 24 - 36 seconds    Comment:        IF BASELINE aPTT IS ELEVATED, SUGGEST PATIENT RISK ASSESSMENT BE USED TO DETERMINE APPROPRIATE ANTICOAGULANT THERAPY. CRITICAL RESULT CALLED TO, READ BACK  BY AND VERIFIED WITH: [facility] YUAL AT 2319 08/20/15 MLZ    Basic metabolic panel     Status: Abnormal   Collection Time: 08/21/15  7:37 AM  Result Value Ref Range   Sodium 137 135 - 145 mmol/L   Potassium 3.8 3.5 - 5.1 mmol/L    Comment: HEMOLYSIS AT THIS LEVEL MAY AFFECT RESULT   Chloride 105 101 - 111 mmol/L   CO2 23 22 - 32 mmol/L   Glucose, Bld 90 65 - 99 mg/dL   BUN 17 6 - 20 mg/dL   Creatinine, Ser 1.00 0.44 - 1.00 mg/dL   Calcium 7.3 (L) 8.9 - 10.3 mg/dL   GFR calc non Af Amer 59 (L) >60 mL/min   GFR calc Af Amer >60 >60 mL/min    Comment: (NOTE) The eGFR has been calculated using the CKD EPI equation. This calculation has not been validated in all clinical situations. eGFR's persistently <60 mL/min signify possible Chronic Kidney Disease.    Anion gap 9 5 -  15  Troponin I (q 6hr x 3)     Status: Abnormal   Collection Time: 08/21/15  7:37 AM  Result Value Ref Range   Troponin I 0.60 (H) <0.031 ng/mL    Comment: PREVIOUS RESULT CALLED AT 1927 08/20/15 BY MLZ/DAS        POSSIBLE MYOCARDIAL ISCHEMIA. SERIAL TESTING RECOMMENDED.   CK     Status: Abnormal   Collection Time: 08/21/15  7:37 AM  Result Value Ref Range   [place] [address] (H) 38 - 234 U/L  CBC     Status: Abnormal   Collection Time: 08/21/15  9:50 AM  Result Value Ref Range   WBC 10.4 3.6 - 11.0 K/uL   RBC 3.70 (L) 3.80 - 5.20 MIL/uL   Hemoglobin 11.7 (L) 12.0 - 16.0 g/dL   HCT 36.2 35.0 - 47.0 %   MCV 97.7 80.0 - 100.0 fL   MCH 31.5 26.0 - 34.0 pg   MCHC 32.2 32.0 - 36.0 g/dL   RDW 14.0 11.5 - 14.5 %   Platelets 161 150 - 440 K/uL  Phenytoin level, total     Status: Abnormal   Collection Time: 08/21/15  9:50 AM  Result Value Ref Range   Phenytoin Lvl 25.0 (H) 10.0 - 20.0 ug/mL  Troponin I (q 6hr x 3)     Status: Abnormal   Collection Time: 08/21/15  2:30 PM  Result Value Ref Range   Troponin I 0.46 (H) <0.031 ng/mL    Comment: PREVIOUS RESULT CALLED AT 1927  08/20/15 BY MLZ...SDR         PERSISTENTLY INCREASED TROPONIN VALUES IN THE RANGE OF 0.04-0.49 ng/mL CAN BE SEEN IN:       -UNSTABLE ANGINA       -CONGESTIVE HEART FAILURE       -MYOCARDITIS       -CHEST TRAUMA       -ARRYHTHMIAS       -LATE PRESENTING MYOCARDIAL INFARCTION       -COPD   CLINICAL FOLLOW-UP RECOMMENDED.   Troponin I (q 6hr x 3)     Status: Abnormal   Collection Time: 08/21/15  5:30 PM  Result Value Ref Range   Troponin I 0.69 (H) <0.031 ng/mL    Comment: PREVIOUS RESULT CALLED AT 1927  08/20/15 BY MLZ...SDR        POSSIBLE MYOCARDIAL ISCHEMIA. SERIAL TESTING RECOMMENDED.   CK     Status: Abnormal   Collection Time: 08/22/15  5:23 AM  Result Value Ref Range   Total CK 7586 (H) 38 - 234 U/L    Dg Chest 1 View  08/21/2015  CLINICAL DATA:  Repositioning of central catheter EXAM: CHEST 1 VIEW COMPARISON:  Study obtained earlier in the day. FINDINGS: Central catheter tip is now in the superior vena cava slightly proximal to the cavoatrial junction. No pneumothorax. Atelectasis remains in the lingula. Lungs elsewhere clear. Heart is prominent bus table. No adenopathy. No bone lesions. IMPRESSION: Stable lingular atelectasis. No new opacity. No change in cardiac silhouette. Central catheter tip is now in the superior vena cava. No pneumothorax. Electronically Signed   By: Lowella Grip III M.D.   On: 08/21/2015 13:50   Ct Head Wo Contrast  08/20/2015  CLINICAL DATA:  64 year old female with altered mental status EXAM: CT HEAD WITHOUT CONTRAST TECHNIQUE: Contiguous axial images were obtained from the base of the skull through the vertex without intravenous contrast. COMPARISON:  None. FINDINGS: Negative for acute intracranial hemorrhage, acute infarction,  mass, mass effect, hydrocephalus or midline shift. Gray-white differentiation is preserved throughout. 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 No focal soft tissue  or calvarial abnormality. Globes and orbits are unremarkable bilaterally. Normal aeration of the mastoid air cells and visualized paranasal sinuses. Atherosclerotic calcifications in both cavernous carotid arteries. IMPRESSION: 1. No acute intracranial abnormality. 2. 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 3. Intracranial atherosclerosis. Electronically Signed   By: Jacqulynn Cadet M.D.   On: 08/20/2015 19:29   Dg Chest Port 1 View  08/21/2015  CLINICAL DATA:  Central catheter placement EXAM: PORTABLE CHEST 1 VIEW COMPARISON:  August 30, 2015 FINDINGS: Central catheter tip is in the right atrium, approximately 8 cm distal to the cavoatrial junction. No pneumothorax. There is persistent atelectasis in the inferior lingula. The lungs elsewhere clear. Heart is slightly enlarged but stable. The pulmonary vascularity is normal. No adenopathy. No bone lesions. IMPRESSION: Central catheter tip in right atrium. No pneumothorax. Persistent lingular atelectatic change. No new opacity in the lungs. Stable mild cardiac enlargement. Electronically Signed   By: Lowella Grip III M.D.   On: 08/21/2015 12:45   Dg Chest Port 1 View  08/20/2015  CLINICAL DATA:  Altered mental status today, patient smokes EXAM: PORTABLE CHEST 1 VIEW COMPARISON:  None. FINDINGS: Mild cardiac silhouette enlargement. Vascular pattern normal. Right lung clear. Band of opacity laterally in the lingula appears most consistent with subsegmental atelectasis. IMPRESSION: Mild subsegmental atelectasis in the lingula ; cannot exclude possibility that this may be due to central obstructing process although mediastinal and hilar contours appear within normal limits on this single AP view. Correlate clinically. Electronically Signed   By: Skipper Cliche M.D.   On: 08/20/2015 18:48    Review of Systems  Unable to perform ROS: psychiatric disorder   Blood pressure 155/70, pulse 95, temperature 99.8 F (37.7 C),  temperature source Oral, resp. rate 20, height '5\' 2"'  (1.575 m), weight 65.726 kg (144 lb 14.4 oz), SpO2 100 %. Physical Exam  Nursing note and vitals reviewed. Constitutional: She is oriented to person, place, and time. She appears well-developed and well-nourished. No distress.  HENT:  Head: Normocephalic and atraumatic.  Right Ear: External ear normal.  Left Ear: External ear normal.  Nose: Nose normal.  Mouth/Throat: Oropharynx is clear and moist.  Eyes: Conjunctivae and EOM are normal. Pupils are equal, round, and reactive to light. No scleral icterus.  Neck: Normal range of motion. Neck supple.  Cardiovascular: Normal rate, regular rhythm, normal heart sounds and intact distal pulses.   No murmur heard. Respiratory: Effort normal and breath sounds normal. No respiratory distress.  GI: Soft. Bowel sounds are normal. She exhibits no distension.  Musculoskeletal: Normal range of motion. She exhibits no edema.  Neurological: She is alert and oriented to person, place, and time. She displays normal reflexes. No cranial nerve deficit. She exhibits normal muscle tone. Coordination normal.  Skin: Skin is warm and dry. She is not diaphoretic.  Psychiatric: Her affect is angry. She is agitated.   CT of head personally reviewed by me and shows trace white matter changes  Assessment/Plan: 1.  Seizure-  Sounds uncontrolled by history but if pt is really having 7-8 seizures/day per note then she should be seen at epilepsy center.  This number sounds a little too high for normal seizures therefore concern for possible pseudoseizures.  These last few seizures could be provoked by current sepsis. 2.  Encephalopathy-  Improving,  this is likely due to sepsis -  Continue Neurotin 332m TID -  Increase Keppra to 1gm BID  -  Restart Dilantin on 08/23/15 at [id number], no need to continuing multiple levels -  No driving or operating heavy machinery -  Will sign off, please call with questions -  [facility] or sooner if seizures continue  [email or web address] 08/22/2015, 2:06 PM

## 2015-08-22 NOTE — Progress Notes (Signed)
Physical Therapy Evaluation Patient Details Name: Anne Phillips MRN: [id number] DOB: 1950/10/18 Today's Date: 08/22/2015   History of Present Illness  Anne Phillips is a 64 y.o. female with a known history of seizure disorder, coronary artery disease status post stent placement, medical noncompliance is presenting with altered mental status.   jPt admitted with h/o seizure, sepisis likely source urine, nstemi from demand ischemia, rhabodo s/p fall.  CT head negative for acute findings.  Clinical Impression  Pt presents with decreased strength and balance affecting pt's ability to safely perform bed mobility, transfers, and ambulation.  Pt with decreased awareness and lethargy demonstrated poor balance and is at risk for falls.  Pt needs Mod A for bed mobility and transfers and Min A for ambulation 20' with constant cues for safety with walker.  Pt assisted to BR and required Min A with set up for personal hygiene.  Pt would benefit from acute PT services to address objective findings.    Follow Up Recommendations SNF    Equipment Recommendations  Rolling walker with 5" wheels    Recommendations for Other Services       Precautions / Restrictions Precautions Precautions: Fall Restrictions Weight Bearing Restrictions: No      Mobility  Bed Mobility Overal bed mobility: Needs Assistance Bed Mobility: Supine to Sit;Sit to Supine     Supine to sit: Mod assist Sit to supine: Mod assist   General bed mobility comments: Attempted to crawl back into bed per usual but unable due to weakness; pt then turned to sit and collapsed back onto bed from fatigue.  Transfers Overall transfer level: Needs assistance Equipment used: Rolling walker (2 wheeled) Transfers: Sit to/from Stand Sit to Stand: Min assist         General transfer comment: Generally good body mechanics with sit<>stand transfer but requires much effort and unsteady upon initial  standing.  Ambulation/Gait Ambulation/Gait assistance: Min assist Ambulation Distance (Feet): 20 Feet Assistive device: Rolling walker (2 wheeled) Gait Pattern/deviations: Step-through pattern;Shuffle Gait velocity: decreased   General Gait Details: Slow and unsteady gait with poor control of RW, oftern stepping outside of the base and needing verbal cues for safety.  Pt at risk for additional falls.  Stairs            Wheelchair Mobility    Modified Rankin (Stroke Patients Only)       Balance Overall balance assessment: Needs assistance Sitting-balance support: Feet supported;Bilateral upper extremity supported Sitting balance-Leahy Scale: Fair Sitting balance - Comments: increased posterior lean   Standing balance support: Bilateral upper extremity supported Standing balance-Leahy Scale: Fair Standing balance comment: decreased dynamic mobility with poor awareness; needs Min A to maintain.                             Pertinent Vitals/Pain Pain Assessment: 0-10 Pain Location: legs Pain Descriptors / Indicators: Aching Pain Intervention(s): Monitored during session    Home Living Family/patient expects to be discharged to:: Private residence Living Arrangements: Alone Available Help at Discharge: Family (sister) Type of Home: House Home Access: Level entry     Home Layout: One level Home Equipment: None      Prior Function Level of Independence: Independent         Comments: Drives, does her own shopping, cooking, cleaning and bathing.     Hand Dominance        Extremity/Trunk Assessment   Upper Extremity Assessment: Generalized weakness  Lower Extremity Assessment: Generalized weakness         Communication   Communication: No difficulties  Cognition Arousal/Alertness: Lethargic Behavior During Therapy: WFL for tasks assessed/performed Overall Cognitive Status: Within Functional Limits for tasks assessed                       General Comments General comments (skin integrity, edema, etc.): unkept, looks like she hasn't bathed recently    Exercises        Assessment/Plan    PT Assessment Patient needs continued PT services  PT Diagnosis Difficulty walking;Generalized weakness;Altered mental status   PT Problem List Decreased strength;Decreased activity tolerance;Decreased balance;Decreased mobility;Decreased cognition;Decreased knowledge of use of DME;Decreased safety awareness  PT Treatment Interventions DME instruction;Gait training;Functional mobility training;Therapeutic activities;Therapeutic exercise;Balance training;Patient/family education   PT Goals (Current goals can be found in the Care Plan section) Acute Rehab PT Goals Patient Stated Goal: none stated    Frequency Min 2X/week   Barriers to discharge Decreased caregiver support      Co-evaluation               End of Session Equipment Utilized During Treatment: Gait belt Activity Tolerance: Patient limited by lethargy;Patient limited by fatigue Patient left: in bed;with bed alarm set;with call bell/phone within reach Nurse Communication: Mobility status         Time: [id number] PT Time Calculation (min) (ACUTE ONLY): 31 min   Charges:   PT Evaluation $Initial PT Evaluation Tier I: 1 Procedure     PT G Codes:        Caitlain Tweed A Melba Araki 09-04-2015, 10:26 AM

## 2015-08-22 NOTE — Care Management (Signed)
Physical therapy is recommending skilled nursing placement.  Blood cultures are positive.  Have left voicemail for patient's sister Clarene Critchley

## 2015-08-23 LAB — BASIC METABOLIC PANEL
Anion gap: 7 (ref 5–15)
BUN: 8 mg/dL (ref 6–20)
CHLORIDE: 101 mmol/L (ref 101–111)
CO2: 29 mmol/L (ref 22–32)
CREATININE: 0.64 mg/dL (ref 0.44–1.00)
Calcium: 7 mg/dL — ABNORMAL LOW (ref 8.9–10.3)
GFR calc Af Amer: >60 mL/min (ref >60)
GFR calc non Af Amer: >60 mL/min (ref >60)
Glucose, Bld: 96 mg/dL (ref 65–99)
POTASSIUM: 3.2 mmol/L — AB (ref 3.5–5.1)
Sodium: 137 mmol/L (ref 135–145)

## 2015-08-23 LAB — CK: Total CK: 7461 U/L — ABNORMAL HIGH (ref 38–234)

## 2015-08-23 LAB — PHENYTOIN LEVEL, FREE AND TOTAL
PHENYTOIN, TOTAL: 18.2 ug/mL (ref 10.0–20.0)
Phenytoin, Free: 3.1 ug/mL — ABNORMAL HIGH (ref 1.0–2.0)

## 2015-08-23 LAB — AMMONIA: AMMONIA: 57 umol/L — AB (ref 9–35)

## 2015-08-23 LAB — C DIFFICILE QUICK SCREEN W PCR REFLEX
C DIFFICLE (CDIFF) ANTIGEN: NEGATIVE
C Diff interpretation: NEGATIVE
C Diff toxin: NEGATIVE

## 2015-08-23 MED ORDER — DEXTROSE 5 % IV SOLN
2.0000 g | INTRAVENOUS | Status: DC
  Administered 2015-08-23 – 2015-08-27 (×5): 2 g via INTRAVENOUS
  Filled 2015-08-23 (×7): qty 2

## 2015-08-23 MED ORDER — POTASSIUM CHLORIDE 10 MEQ/100ML IV SOLN
10.0000 meq | INTRAVENOUS | Status: AC
  Administered 2015-08-23 (×4): 10 meq via INTRAVENOUS
  Filled 2015-08-23 (×4): qty 100

## 2015-08-23 NOTE — Progress Notes (Signed)
Clinical Education officer, museum (CSW) met with patient to gather more information about her financial status. Patient was alert and oriented and was sitting up in the bed eating breakfast. Per patient she receives $848 per month in social security. Patient reported that she has applied for disability in April 2016 and has an Forensic psychologist. Patient also reported that she was turned down for Medicaid 3 months ago and does not know why. Per patient her sisters want her to go into a SNF for long term care. Patient gave CSW permission to call her sisters. CSW left voicemail's for both patient's sisters. CSW contacted [facility] admissions coordinator at [facility] and [place] and asked him to review referral for LOG. CSW also left a voicemail with Surveyor, [employer] for [employer]. CSW attempted to call [facility] however the phone continued to ring and with no answer or voicemail. CSW will continue to follow and assist as needed.   Blima Rich, [place] [phone]

## 2015-08-23 NOTE — Progress Notes (Addendum)
Patient transferred to room 225 via bed with oxygen and IV pump. Report called to emily. Patient family notified of transfer. Patient stable at time of transfer.

## 2015-08-23 NOTE — Progress Notes (Signed)
[facility] [place] NAME: Anne Phillips    MR#:  [id number]  DATE OF BIRTH:  1951/02/28  SUBJECTIVE:  CHIEF COMPLAINT:   Chief Complaint  Patient presents with  . Altered Mental Status   -has remained very drowsy, also having urinary retention requiring foley again today - not oriented. Fevers better, but last night, temp as high as 103F - having diarrhea  REVIEW OF SYSTEMS:  Review of Systems  Constitutional: Positive for fever and malaise/fatigue. Negative for chills.  HENT: Negative for ear discharge, ear pain and tinnitus.   Eyes: Negative for blurred vision.  Respiratory: Negative for cough, shortness of breath and wheezing.   Cardiovascular: Negative for chest pain, palpitations and leg swelling.  Gastrointestinal: Positive for diarrhea. Negative for nausea, vomiting, abdominal pain and constipation.  Genitourinary: Negative for dysuria.  Musculoskeletal: Positive for myalgias.  Neurological: Negative for dizziness, sensory change, speech change, seizures and headaches.  Psychiatric/Behavioral: Negative for depression.       Confusion present    DRUG ALLERGIES:  No Known Allergies  VITALS:  Blood pressure 169/81, pulse 82, temperature 98.5 F (36.9 C), temperature source Oral, resp. rate 18, height 5\' 2"  (1.575 m), weight 65.726 kg (144 lb 14.4 oz), SpO2 100 %.  PHYSICAL EXAMINATION:  Physical Exam  GENERAL:  64 y.o.-year-old patient lying in the bed with no acute distress.  EYES: Pupils equal, round, reactive to light and accommodation. No scleral icterus. Extraocular muscles intact.  HEENT: Head atraumatic, normocephalic. Oropharynx and nasopharynx clear.  NECK:  Supple, no jugular venous distention. No thyroid enlargement, no tenderness.  LUNGS: Normal breath sounds bilaterally, no wheezing, rales,rhonchi or crepitation. No use of accessory muscles of respiration. Decreased bibasilar breath  sounds. CARDIOVASCULAR: S1, S2 normal. No  rubs, or gallops. 3/6 systolic murmur present ABDOMEN: Soft, nontender, nondistended. Bowel sounds present. No organomegaly or mass.  EXTREMITIES: No pedal edema, cyanosis, or clubbing.  NEUROLOGIC: Cranial nerves II through XII are intact. Muscle strength 5/5 in all extremities. Sensation intact. Gait not checked.  PSYCHIATRIC: The patient is sleepy, arousable, but oriented only to self SKIN: No obvious rash, lesion, or ulcer.    LABORATORY PANEL:   CBC  Recent Labs Lab 08/22/15 1405  WBC 6.2  HGB 10.5*  HCT 32.1*  PLT 126*   ------------------------------------------------------------------------------------------------------------------  Chemistries   Recent Labs Lab 08/20/15 1829  08/23/15 0533  NA 134*  < > 137  K 2.5*  < > 3.2*  CL 91*  < > 101  CO2 31  < > 29  GLUCOSE 109*  < > 96  BUN 17  < > 8  CREATININE 0.97  < > 0.64  CALCIUM 8.3*  < > 7.0*  AST 159*  --   --   ALT 19  --   --   ALKPHOS 121  --   --   BILITOT 1.0  --   --   < > = values in this interval not displayed. ------------------------------------------------------------------------------------------------------------------  Cardiac Enzymes  Recent Labs Lab 08/21/15 1730  TROPONINI 0.69*   ------------------------------------------------------------------------------------------------------------------  RADIOLOGY:  No results found.  EKG:   Orders placed or performed during the hospital encounter of 08/20/15  . ED EKG  . ED EKG  . EKG 12-Lead  . EKG 12-Lead    ASSESSMENT AND PLAN:   64 year old female with past medical history significant for coronary artery disease status post stents, hypertension, seizure disorder, ongoing smoking presents to the hospital  secondary to fall. Noted to be septic and also has rhabdomyolysis.  #1 sepsis- likely source urine. -Fevers have improved.  -Blood cultures growing Escherichia coli , not ESBL. So  discontinue meropenem and start rocephin for now - looks like urine cultures are not sent -Blood pressure is stable at this time  #2 elevated troponins-likely demand ischemia. Troponins have plateaued. -No chest pain. Also from rhabdomyolysis. - echo with mild LVH and no wall motion abnormalities. Cardiology consulted. No further cardiac workup needed  #3 rhabdomyolysis-likely from fall. Patient not sure if she had a seizure prior to the fall or not. Has been having multiple falls lately. -Will need rehabilitation at discharge. Not safe to go back home by herself. -IV fluids and continue to monitor CK levels. CK level improving  #4 seizure disorder-known history of seizure disorder -Follows with Dr. Celesta Aver at [facility] -Patient was taking increased Dilantin at home than was advised. Dilantin level was elevated on admission. -Dilantin was stopped and now level normalized. -so will be restarted today. She needs to be seen at an epilepsy center. -Appreciate neurology consult. -Continue her keppra and Neurontin. keppra level within normal limits   #5 hypokalemia-being replaced.  #6 metabolic encephalopathy-likely from her sepsis and also elevated Dilantin level. -Continues to improve at this point. -CT of the head negative for any acute findings. -Continue to monitor.  #7 DVT prophylaxis-on subcutaneous Lovenox   Physical therapy consulted and they have recommended rehabilitation. Discharge once mental status improves and also fevers resolve   All the records are reviewed and case discussed with Care Management/Social Workerr. Management plans discussed with the patient, family and they are in agreement.  CODE STATUS: Full code  TOTAL TIME TAKING CARE OF THIS PATIENT: 42 minutes.   POSSIBLE D/C IN 1-2 DAYS, DEPENDING ON CLINICAL CONDITION.   Gladstone Lighter M.D on 08/23/2015 at 2:40 PM  Between 7am to 6pm - Pager - [phone]  After 6pm go to [email or web address] - password  [facility]  Office  [phone]  CC: Primary care physician; Pcp Not In System

## 2015-08-23 NOTE — Progress Notes (Signed)
ANTIBIOTIC CONSULT NOTE - INITIAL  Pharmacy Consult for Ceftriaxone  Indication: bacteremia   No Known Allergies  Patient Measurements: Height: 5\' 2"  (157.5 cm) Weight: 144 lb 14.4 oz (65.726 kg) IBW/kg (Calculated) : 50.1  Vital Signs: Temp: 98 F (36.7 C) (12/22 0431) Temp Source: Oral (12/22 0431) BP: 143/69 mmHg (12/22 0431) Pulse Rate: 79 (12/22 0431) Intake/Output from previous day: 12/21 0701 - 12/22 0700 In: [id number] [P.O.:240; I.V.:1203; IV Piggyback:100] Out: 725 [Urine:725]  Recent Labs  08/20/15 1829 08/21/15 0737 08/21/15 0950 08/22/15 1405 08/23/15 0533  WBC 13.5*  --  10.4 6.2  --   HGB 14.0  --  11.7* 10.5*  --   PLT 179  --  161 126*  --   CREATININE 0.97 1.00  --  0.72 0.64   Estimated Creatinine Clearance: 64 mL/min (by C-G formula based on Cr of 0.64). No results for input(s): VANCOTROUGH, VANCOPEAK, VANCORANDOM, GENTTROUGH, GENTPEAK, GENTRANDOM, TOBRATROUGH, TOBRAPEAK, TOBRARND, AMIKACINPEAK, AMIKACINTROU, AMIKACIN in the last 72 hours.   Microbiology: Recent Results (from the past 720 hour(s))  Culture, blood (Routine X 2) w Reflex to ID Panel     Status: None (Preliminary result)   Collection Time: 08/20/15  6:30 PM  Result Value Ref Range Status   Specimen Description BLOOD LEFT ASSIST CONTROL  Final   Special Requests BOTTLES DRAWN AEROBIC AND ANAEROBIC 1CC  Final   Culture NO GROWTH 2 DAYS  Final   Report Status PENDING  Incomplete  Culture, blood (Routine X 2) w Reflex to ID Panel     Status: None (Preliminary result)   Collection Time: 08/20/15  6:47 PM  Result Value Ref Range Status   Specimen Description BLOOD RIGHT ASSIST CONTROL  Final   Special Requests   Final    BOTTLES DRAWN AEROBIC AND ANAEROBIC [place] AERO 5 CC ANA   Culture  Setup Time   Final    GRAM NEGATIVE RODS IN BOTH AEROBIC AND ANAEROBIC BOTTLES CRITICAL RESULT CALLED TO, READ BACK BY AND VERIFIED WITH: CHRISTINE KATSOUDAS AT [id number] ON 08/21/15 CTJ    Culture   Final   ESCHERICHIA COLI IN BOTH AEROBIC AND ANAEROBIC BOTTLES SUSCEPTIBILITIES TO FOLLOW    Report Status PENDING  Incomplete  Blood Culture ID Panel (Reflexed)     Status: Abnormal   Collection Time: 08/20/15  6:47 PM  Result Value Ref Range Status   Enterococcus species NOT DETECTED NOT DETECTED Final   Listeria monocytogenes NOT DETECTED NOT DETECTED Final   Staphylococcus species NOT DETECTED NOT DETECTED Final   Staphylococcus aureus NOT DETECTED NOT DETECTED Final   Streptococcus species NOT DETECTED NOT DETECTED Final   Streptococcus agalactiae NOT DETECTED NOT DETECTED Final   Streptococcus pneumoniae NOT DETECTED NOT DETECTED Final   Streptococcus pyogenes NOT DETECTED NOT DETECTED Final   Acinetobacter baumannii NOT DETECTED NOT DETECTED Final   Enterobacteriaceae species NOT DETECTED NOT DETECTED Final   Enterobacter cloacae complex NOT DETECTED NOT DETECTED Final   Escherichia coli DETECTED (A) NOT DETECTED Final    Comment: CRITICAL RESULT CALLED TO AND VERIFIED BY: CHRISTINE KATSOUDAS AT [id number] ON 08/21/15 CTJ   Klebsiella oxytoca NOT DETECTED NOT DETECTED Final   Klebsiella pneumoniae NOT DETECTED NOT DETECTED Final   Proteus species NOT DETECTED NOT DETECTED Final   Serratia marcescens NOT DETECTED NOT DETECTED Final   Haemophilus influenzae NOT DETECTED NOT DETECTED Final   Neisseria meningitidis NOT DETECTED NOT DETECTED Final   Pseudomonas aeruginosa NOT DETECTED NOT DETECTED Final  Candida albicans NOT DETECTED NOT DETECTED Final   Candida glabrata NOT DETECTED NOT DETECTED Final   Candida krusei NOT DETECTED NOT DETECTED Final   Candida parapsilosis NOT DETECTED NOT DETECTED Final   Candida tropicalis NOT DETECTED NOT DETECTED Final   Carbapenem resistance NOT DETECTED NOT DETECTED Final   Methicillin resistance NOT DETECTED NOT DETECTED Final   Vancomycin resistance NOT DETECTED NOT DETECTED Final  C difficile quick scan w PCR reflex     Status: None   Collection  Time: 08/23/15  2:00 AM  Result Value Ref Range Status   C Diff antigen NEGATIVE NEGATIVE Final   C Diff toxin NEGATIVE NEGATIVE Final   C Diff interpretation Negative for C. difficile  Final   Medical History: Past Medical History  Diagnosis Date  . Seizures ([place])   . Thyroid disease   . Coronary artery disease   . Hypertension   . Hyperlipidemia    Assessment: 64 yo female with PMH of seizure disorder and CAD presented to ED on 12/19 with AMS. [employer] BCx ID shows E.Coli in both aerobic and anaerobic bottles. Zosyn and Vancomycin was started in ED on 12/19, discontinued on 12/20 and meropenem consult was placed.   12/22: Pharmacy now consulted for dosing of ceftriaxone, meropenem Dcd   WBC: 6.2           CK: 7461 ( down from 13018)   Tmax: 103 (12/21 @2100 )    CrCl: 64 mL/min    Scr: 0.64 mg/dL   Kg: 65.7      12/19 Bcx X2: E. Coli susceptibilities pending   Plan:  [employer] pre-set recommendations suggest meropenem IV q8h until susceptibilities are known, Susceptibilities are still pending at this time.  Will start patient on ceftriaxone 2gm IV daily.  Will continue to monitor cultures and susceptibilities.  Pharmacy will continue to follow per consult.    Nancy Fetter, PharmD Pharmacy Resident  08/23/2015,9:35 AM

## 2015-08-23 NOTE — Progress Notes (Signed)
Clinical Education officer, museum (CSW) received call back from patient's sister Orion Modest. Per sister patient was denied Medicaid over a year ago because she would not go the required appointments. Sister is agreeable to SNF placement and reported that she would assist with Medicaid application. CSW gave sister Towanda Octave Chief Executive Officer) phone number. CSW spoke with Tammy with [facility] and made her aware of above. Per Tammy she will get back to CSW regarding bed.   Blima Rich, [place] [phone]

## 2015-08-24 ENCOUNTER — Inpatient Hospital Stay: Payer: Self-pay

## 2015-08-24 ENCOUNTER — Encounter: Payer: Self-pay | Admitting: Radiology

## 2015-08-24 LAB — CK: CK TOTAL: 3069 U/L — AB (ref 38–234)

## 2015-08-24 LAB — RAPID HIV SCREEN (HIV 1/2 AB+AG)
HIV 1/2 Antibodies: NONREACTIVE
HIV-1 P24 Antigen - HIV24: NONREACTIVE

## 2015-08-24 LAB — BASIC METABOLIC PANEL
ANION GAP: 2 — AB (ref 5–15)
BUN: 5 mg/dL — ABNORMAL LOW (ref 6–20)
CALCIUM: 6 mg/dL — AB (ref 8.9–10.3)
CO2: 26 mmol/L (ref 22–32)
CREATININE: 0.5 mg/dL (ref 0.44–1.00)
Chloride: 107 mmol/L (ref 101–111)
GFR calc non Af Amer: >60 mL/min (ref >60)
Glucose, Bld: 84 mg/dL (ref 65–99)
Potassium: 5.7 mmol/L — ABNORMAL HIGH (ref 3.5–5.1)
SODIUM: 135 mmol/L (ref 135–145)

## 2015-08-24 LAB — POTASSIUM: Potassium: 4.1 mmol/L (ref 3.5–5.1)

## 2015-08-24 IMAGING — CT CT ABD-PELV W/ CM
2 of 5 series · 15 of 46 positions shown, 17 images · IV contrast (omnipaque)
Comparison: None available

CLINICAL DATA: Bacteremia due to Escherichia with mild LUQ and LLQ
tenderness

EXAM:
CT ABDOMEN AND PELVIS WITH CONTRAST
TECHNIQUE: Multidetector CT imaging of the abdomen and pelvis was performed
using the standard protocol following bolus administration of
intravenous contrast.
CONTRAST:  100mL OMNIPAQUE IOHEXOL 300 MG/ML  SOLN

[Series 2: routine abd pel with · axial · 0.75mm/px · z∈[-950,-520]mm · 12 of 98 slices shown, 14 images]
[im 6/98  soft-tissue]
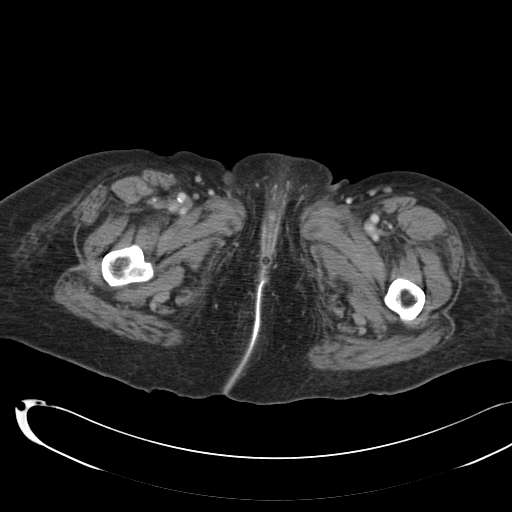
[im 6/98  bone]
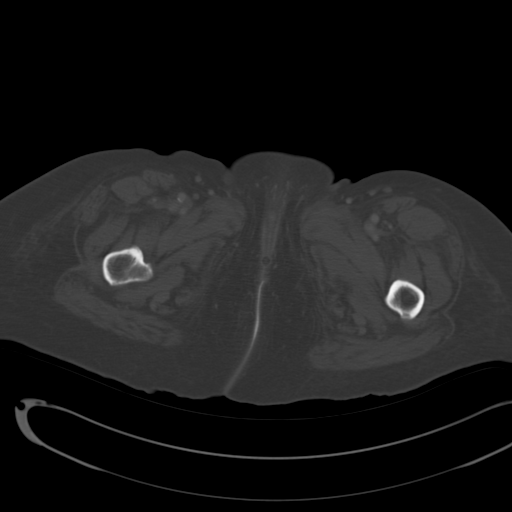
[im 17/98  soft-tissue]
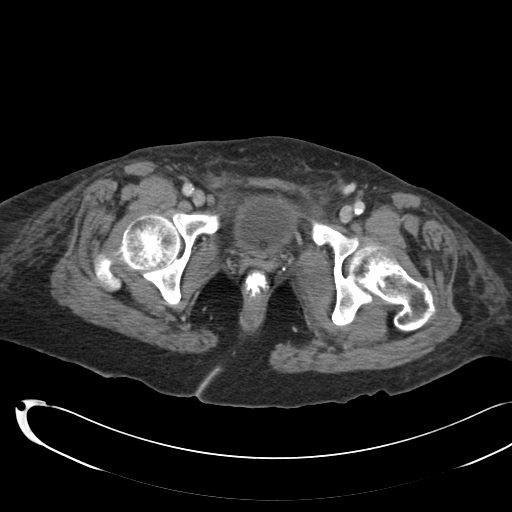
[im 22/98  soft-tissue]
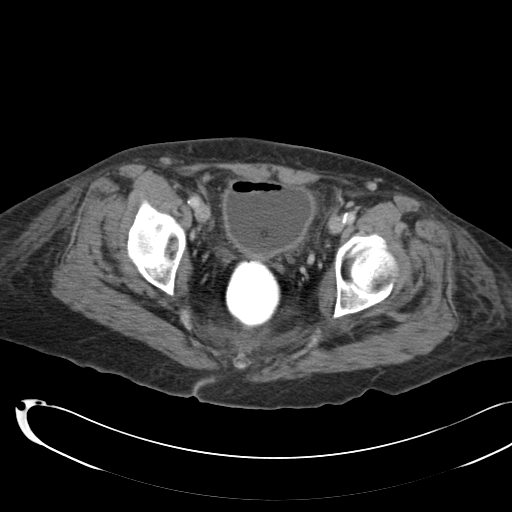
[im 27/98  soft-tissue]
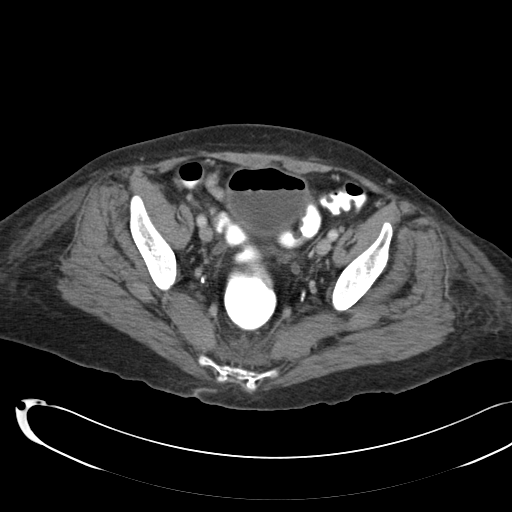
[im 38/98  soft-tissue]
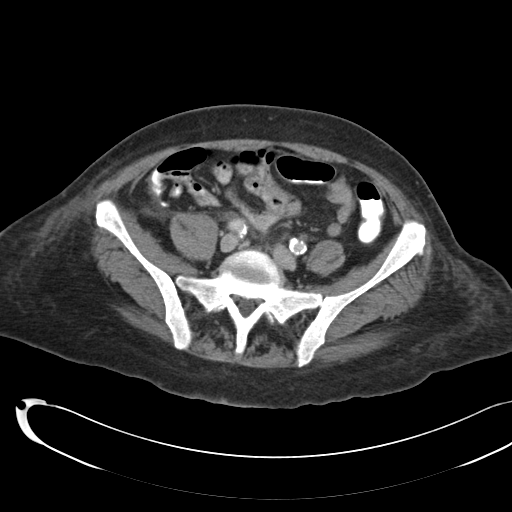
[im 44/98  soft-tissue]
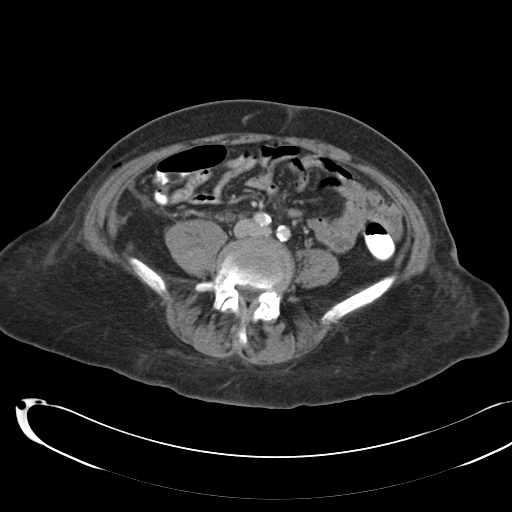
[im 54/98  soft-tissue]
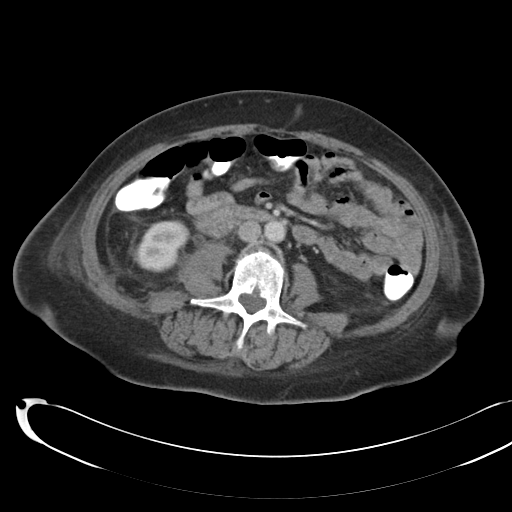
[im 60/98  soft-tissue]
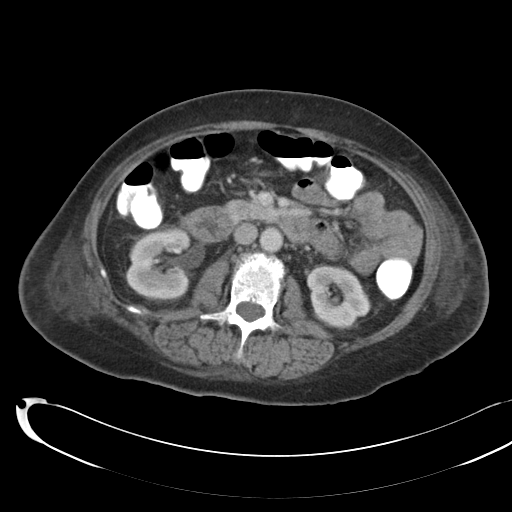
[im 71/98  soft-tissue]
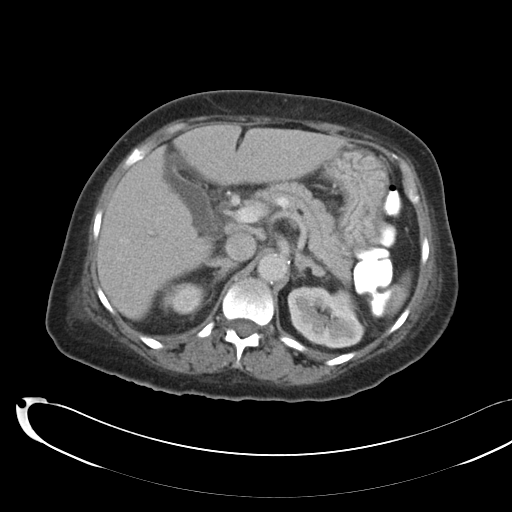
[im 71/98  bone]
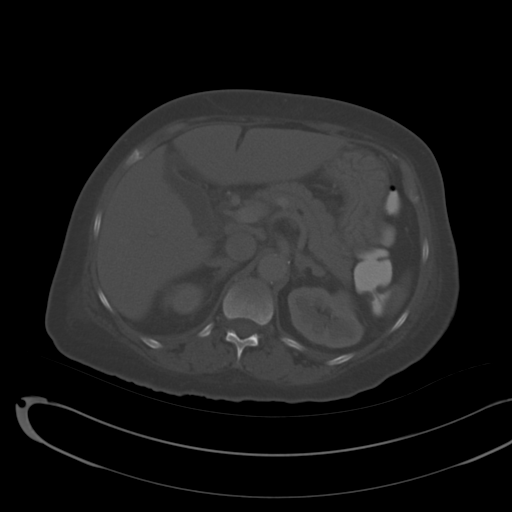
[im 76/98  soft-tissue]
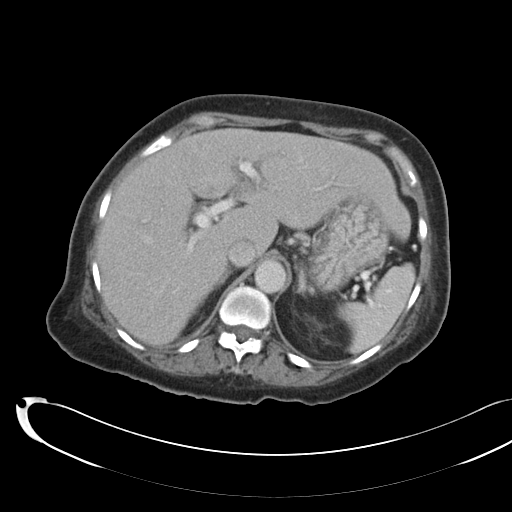
[im 81/98  soft-tissue]
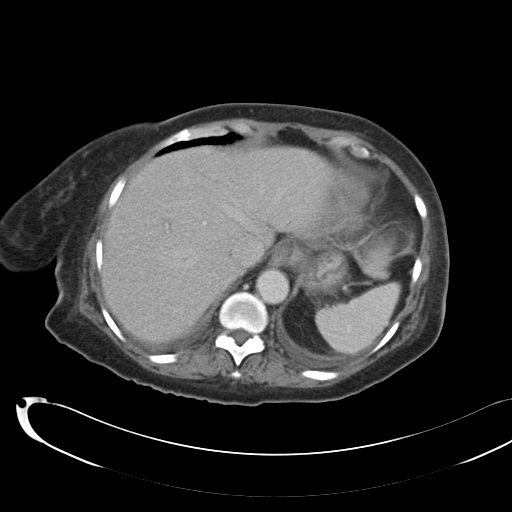
[im 92/98  soft-tissue]
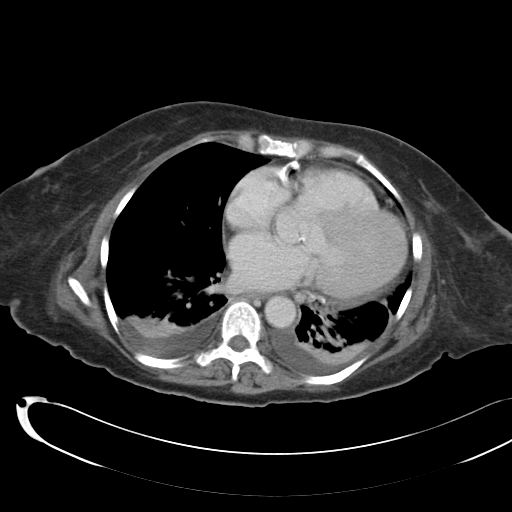

[Series 5: cor routine abd pel with · coronal · 0.65mm/px · 3 of 113 slices shown]
[im 38/113  soft-tissue]
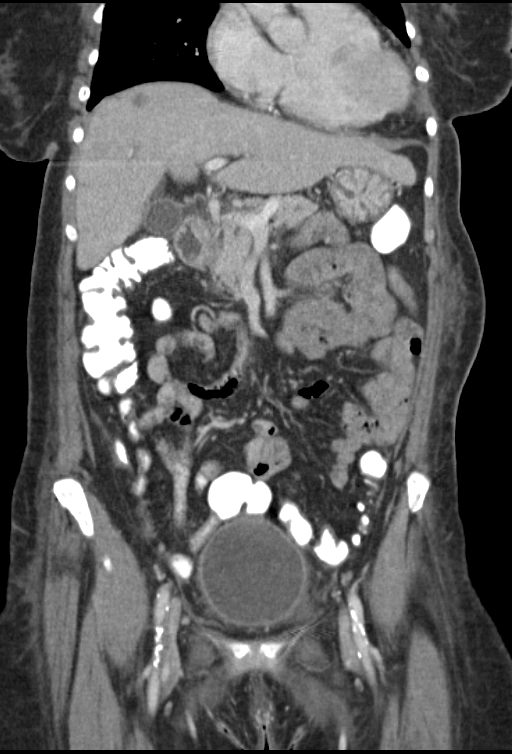
[im 50/113  soft-tissue]
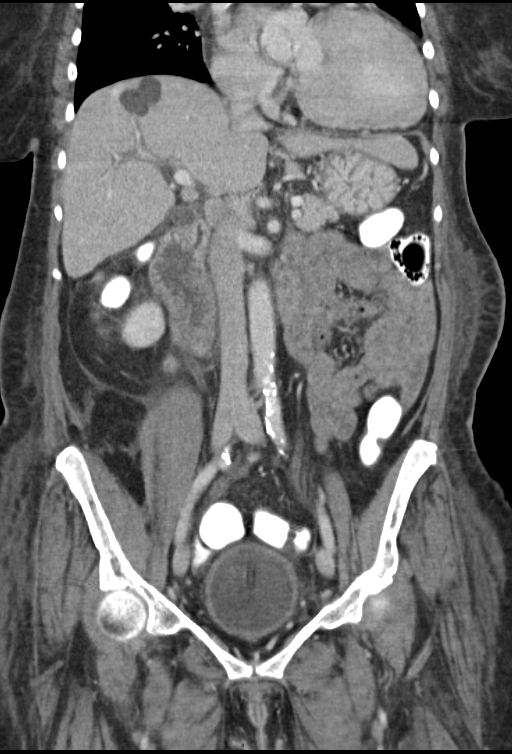
[im 63/113  soft-tissue]
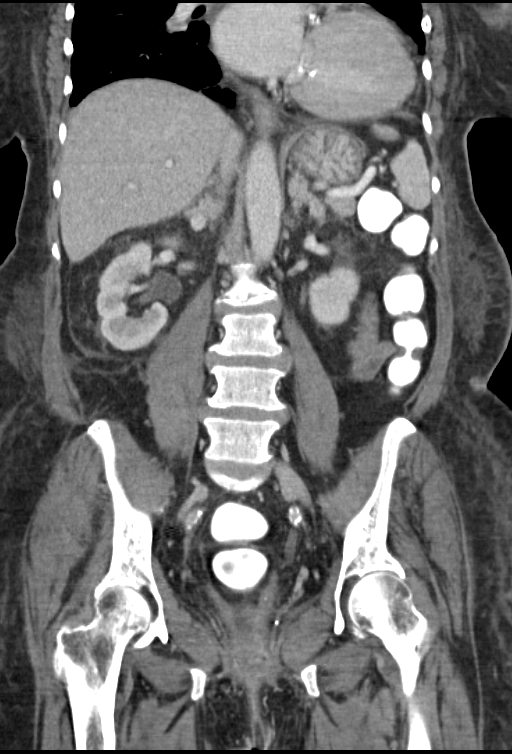

[15 of 46 positions shown; findings below may reference images not displayed]

FINDINGS: Small bilateral pleural effusions. Adjacent consolidation/
atelectasis posteriorly in the lung bases. Moderate coronary
calcifications. Mild cardiomegaly, incompletely visualized.

Somewhat lobulated 3.3 cm fluid attenuation lesion in hepatic
segment 8 with some thin internal septations, no adjacent
parenchymal enhancement abnormalities . No other liver lesion.
Gallbladder is nondilated. Unremarkable spleen. 1 cm enhancing right
adrenal nodule. Lobular left adrenal gland.

There is diffuse mild urothelial prominence in the central right
renal collecting system and mild right ureterectasis in its proximal
and mid segments, but no renal or ureteral calculus. Foley catheter
partially decompresses the urinary bladder, which contains a small
amount of gas presumably from instrumentation. Renal parenchyma
unremarkable. Symmetric bilateral renal excretion on delayed scans.

Stomach, small bowel, and colon are nondilated. Normal appendix
containing contrast.

No ascites. No free air. No adenopathy. Portal vein patent.
Scattered aortoiliac calcified plaque without aneurysm or stenosis.
Mild presacral soft tissue edema, poorly marginated. No evidence of
abscess.
IMPRESSION: 1. Urothelial prominence in the right renal collecting system and
proximal ureter. Correlate with any clinical or laboratory evidence
of UTI.
2. Small bilateral pleural effusions with adjacent
consolidation/atelectasis in the lung bases.
3. Atherosclerosis, including aortoiliac and coronary artery
disease. Please note that although the presence of coronary artery
calcium documents the presence of coronary artery disease, the
severity of this disease and any potential stenosis cannot be
assessed on this non-gated CT examination. Assessment for potential
risk factor modification, dietary therapy or pharmacologic therapy
may be warranted, if clinically indicated.
4. 3.3 cm  septated cyst, hepatic segment 8.

## 2015-08-24 MED ORDER — IOHEXOL 300 MG/ML  SOLN
100.0000 mL | Freq: Once | INTRAMUSCULAR | Status: AC | PRN
  Administered 2015-08-24: 100 mL via INTRAVENOUS

## 2015-08-24 MED ORDER — IOHEXOL 240 MG/ML SOLN: 50.0000 mL | Freq: Once | INTRAMUSCULAR | Status: DC | PRN

## 2015-08-24 MED ORDER — SODIUM CHLORIDE 0.9 % IV SOLN
2.0000 g | Freq: Once | INTRAVENOUS | Status: AC
  Administered 2015-08-24: 2 g via INTRAVENOUS
  Filled 2015-08-24: qty 20

## 2015-08-24 MED ORDER — CALCIUM CARBONATE ANTACID 500 MG PO CHEW
1.0000 | CHEWABLE_TABLET | Freq: Every day | ORAL | Status: DC
  Administered 2015-08-25 – 2015-08-28 (×4): 200 mg via ORAL
  Filled 2015-08-24 (×4): qty 1

## 2015-08-24 MED ORDER — VANCOMYCIN HCL IN DEXTROSE 1-5 GM/200ML-% IV SOLN
1000.0000 mg | INTRAVENOUS | Status: DC
  Filled 2015-08-24: qty 200

## 2015-08-24 MED ORDER — VANCOMYCIN HCL IN DEXTROSE 1-5 GM/200ML-% IV SOLN
1000.0000 mg | Freq: Once | INTRAVENOUS | Status: AC
  Administered 2015-08-24: 1000 mg via INTRAVENOUS
  Filled 2015-08-24: qty 200

## 2015-08-24 NOTE — Progress Notes (Signed)
Per Tammy with [facility] the financial counselor will have to start Medicaid application at the hospital before they can accept patient. Clinical Education officer, museum (CSW) spoke with Amy Development worker, community at [facility] who reported that they cannot do a long term care SNF Medicaid application because they are not trained to do that however they can do a "hospital medicaid application." Per Amy financial counselor Faythe Dingwall is assigned to patient's case. CSW contacted Faythe Dingwall who reported that she screened patient on 08/21/15 and stated that patient does not have a disability. Per Faythe Dingwall she can get patient to sign medicaid application and send it in today but will not get a pending reference number until the office opens next Wednesday 08/29/15. CSW contacted Tammy and made her aware of above. Per Tammy she has spoken with patient's sister [name] via telephone and could not get much information. Per Tammy she wants to visit patient today and speak with her in person before making a bed offer. CSW met with patient and made her aware that Tammy will be coming to see her today around lunch time. Patient reported that she is agreeable to meet with Tammy. CSW also received call from patient's sister [name]. Per sister she has left 2 voicemails with Vance Gather Medicaid worker. CSW informed sister that Kalman Shan will be back in the office 08/29/15. CSW will continue to follow and assist as needed.  Blima Rich, [place] [phone]

## 2015-08-24 NOTE — Progress Notes (Signed)
ANTIBIOTIC CONSULT NOTE -  INITIAL/Follow Up  Pharmacy Consult for Vancomycin/Ceftriaxone  Indication: bacteremia /Sepsis   No Known Allergies  Patient Measurements: Height: 5\' 2"  (157.5 cm) Weight: 144 lb 14.4 oz (65.726 kg) IBW/kg (Calculated) : 50.1  Vital Signs: Temp: 98.7 F (37.1 C) (12/23 0541) Temp Source: Oral (12/23 0541) BP: 162/74 mmHg (12/23 0541) Pulse Rate: 81 (12/23 0541) Intake/Output from previous day: 12/22 0701 - 12/23 0700 In: 1395.3 [P.O.:360; I.V.:1035.3] Out: 1525 [Urine:1525]  Recent Labs  08/21/15 0950 08/22/15 1405 08/23/15 0533  WBC 10.4 6.2  --   HGB 11.7* 10.5*  --   PLT 161 126*  --   CREATININE  --  0.72 0.64   Estimated Creatinine Clearance: 64 mL/min (by C-G formula based on Cr of 0.64). No results for input(s): VANCOTROUGH, VANCOPEAK, VANCORANDOM, GENTTROUGH, GENTPEAK, GENTRANDOM, TOBRATROUGH, TOBRAPEAK, TOBRARND, AMIKACINPEAK, AMIKACINTROU, AMIKACIN in the last 72 hours.   Microbiology: Recent Results (from the past 720 hour(s))  Culture, blood (Routine X 2) w Reflex to ID Panel     Status: None (Preliminary result)   Collection Time: 08/20/15  6:30 PM  Result Value Ref Range Status   Specimen Description BLOOD LEFT ASSIST CONTROL  Final   Special Requests BOTTLES DRAWN AEROBIC AND ANAEROBIC 1CC  Final   Culture NO GROWTH 4 DAYS  Final   Report Status PENDING  Incomplete  Culture, blood (Routine X 2) w Reflex to ID Panel     Status: None   Collection Time: 08/20/15  6:47 PM  Result Value Ref Range Status   Specimen Description BLOOD RIGHT ASSIST CONTROL  Final   Special Requests   Final    BOTTLES DRAWN AEROBIC AND ANAEROBIC [place] AERO 5 CC ANA   Culture  Setup Time   Final    GRAM NEGATIVE RODS CRITICAL RESULT CALLED TO, READ BACK BY AND VERIFIED WITH: CHRISTINE KATSOUDAS AT [id number] ON 08/21/15 CTJ    Culture ESCHERICHIA COLI  Final   Report Status 08/23/2015 FINAL  Final   Organism ID, Bacteria ESCHERICHIA COLI  Final   Susceptibility   Escherichia coli - MIC*    AMPICILLIN >=32 RESISTANT Resistant     CEFTAZIDIME <=1 SENSITIVE Sensitive     CEFAZOLIN <=4 SENSITIVE Sensitive     CEFTRIAXONE <=1 SENSITIVE Sensitive     CIPROFLOXACIN <=0.25 SENSITIVE Sensitive     GENTAMICIN <=1 SENSITIVE Sensitive     IMIPENEM <=0.25 SENSITIVE Sensitive     TRIMETH/SULFA <=20 SENSITIVE Sensitive     PIP/TAZO Value in next row Sensitive      SENSITIVE<=4    * ESCHERICHIA COLI  Blood Culture ID Panel (Reflexed)     Status: Abnormal   Collection Time: 08/20/15  6:47 PM  Result Value Ref Range Status   Enterococcus species NOT DETECTED NOT DETECTED Final   Listeria monocytogenes NOT DETECTED NOT DETECTED Final   Staphylococcus species NOT DETECTED NOT DETECTED Final   Staphylococcus aureus NOT DETECTED NOT DETECTED Final   Streptococcus species NOT DETECTED NOT DETECTED Final   Streptococcus agalactiae NOT DETECTED NOT DETECTED Final   Streptococcus pneumoniae NOT DETECTED NOT DETECTED Final   Streptococcus pyogenes NOT DETECTED NOT DETECTED Final   Acinetobacter baumannii NOT DETECTED NOT DETECTED Final   Enterobacteriaceae species NOT DETECTED NOT DETECTED Final   Enterobacter cloacae complex NOT DETECTED NOT DETECTED Final   Escherichia coli DETECTED (A) NOT DETECTED Final    Comment: CRITICAL RESULT CALLED TO AND VERIFIED BY: CHRISTINE KATSOUDAS AT [id number] ON 08/21/15  CTJ   Klebsiella oxytoca NOT DETECTED NOT DETECTED Final   Klebsiella pneumoniae NOT DETECTED NOT DETECTED Final   Proteus species NOT DETECTED NOT DETECTED Final   Serratia marcescens NOT DETECTED NOT DETECTED Final   Haemophilus influenzae NOT DETECTED NOT DETECTED Final   Neisseria meningitidis NOT DETECTED NOT DETECTED Final   Pseudomonas aeruginosa NOT DETECTED NOT DETECTED Final   Candida albicans NOT DETECTED NOT DETECTED Final   Candida glabrata NOT DETECTED NOT DETECTED Final   Candida krusei NOT DETECTED NOT DETECTED Final   Candida  parapsilosis NOT DETECTED NOT DETECTED Final   Candida tropicalis NOT DETECTED NOT DETECTED Final   Carbapenem resistance NOT DETECTED NOT DETECTED Final   Methicillin resistance NOT DETECTED NOT DETECTED Final   Vancomycin resistance NOT DETECTED NOT DETECTED Final  C difficile quick scan w PCR reflex     Status: None   Collection Time: 08/23/15  2:00 AM  Result Value Ref Range Status   C Diff antigen NEGATIVE NEGATIVE Final   C Diff toxin NEGATIVE NEGATIVE Final   C Diff interpretation Negative for C. difficile  Final   Medical History: Past Medical History  Diagnosis Date  . Seizures ([place])   . Thyroid disease   . Coronary artery disease   . Hypertension   . Hyperlipidemia    Assessment: 64 yo female with PMH of seizure disorder and CAD presented to ED on 12/19 with AMS. [employer] BCx ID shows E.Coli in both aerobic and anaerobic bottles. Zosyn and Vancomycin was started in ED on 12/19, discontinued on 12/20 and meropenem consult was placed.   12/22: Pharmacy now consulted for dosing of ceftriaxone for bacteremia, meropenem Dcd   12/23: Vancomycin consult added for Sepsis    CK:  3069 ( down from 7461)   Tmax: 102.7  (12/22 @1800 )    CrCl: 64 mL/min    Scr: 0.5 mg/dL       12/19 Bcx X2: E. Coli susceptible to ceftriaxone    Plan:  DW: 56 kg     Ke: 0.058    Vd: 39.2   T1/2: 12     Will start patient on Vancomycin 1g every 18 hours. with 12 hour stack dosing from first infusion time. Css calculated to be 17.4. Will draw trough prior to 4th dose on 12/25 @0930 .   Will continue patient on ceftriaxone 2gm IV daily.   Pharmacy will continue to follow per consult.    Nancy Fetter, PharmD Pharmacy Resident  08/24/2015,7:52 AM

## 2015-08-24 NOTE — Consult Note (Signed)
[facility] Infectious Disease     Reason for Consult:Fevers    Referring Physician: Denton Lank Date of Admission:  08/20/2015   Active Problems:   Sepsis ([place])   NSTEMI (non-ST elevated myocardial infarction) ([place])   Rhabdomyolysis   (HFpEF) heart failure with preserved ejection fraction ([place])   Disorientation   Pyrexia   Hypoxia   Non-traumatic rhabdomyolysis   Coronary artery disease involving native coronary artery of native heart without angina pectoris   HPI: Anne Phillips is a 64 y.o. female with history of seizure disorder, coronary artery disease status post stent placement, medical noncompliance, ongoing tobacco abuse admitted  12/19  with altered mental status. Per family at that time she had a gradual decline over several months. She on admit had temp 104.2 and wbc13 and rhabdo with CPK 20K.   Started on meropenem and vanco and has had BCX + for E coli. UA was TNTC wbc and wbc clumps. Flu negative. Had AST 159 but other lfts nml (likely rhabdo)  Clinically improving but continues with recurrent high grade fevers.  She smokes 3ppd, denies etoh, says she lives alone at home. Denies having any sxs prior to ending up in the ED- cannot recall event surrounding her admission. Denies, HA, neck stiffness.      Past Medical History  Diagnosis Date  . Seizures ([place])   . Thyroid disease   . Coronary artery disease   . Hypertension   . Hyperlipidemia    History reviewed. No pertinent past surgical history. Social History  Substance Use Topics  . Smoking status: Current Every Day Smoker -- 1.00 packs/day    Types: Cigarettes  . Smokeless tobacco: Never Used  . Alcohol Use: No   Family History  Problem Relation Age of Onset  . Hypertension Other     Allergies: No Known Allergies  Current antibiotics: Antibiotics Given (last 72 hours)    Date/Time Action Medication Dose Rate   08/21/15 2252 Given   meropenem (MERREM) 1 g in sodium chloride 0.9 % 100 mL IVPB 1 g  200 mL/hr   08/22/15 0530 Given   meropenem (MERREM) 1 g in sodium chloride 0.9 % 100 mL IVPB 1 g 200 mL/hr   08/22/15 1416 Given   meropenem (MERREM) 1 g in sodium chloride 0.9 % 100 mL IVPB 1 g 200 mL/hr   08/22/15 2257 Given   meropenem (MERREM) 1 g in sodium chloride 0.9 % 100 mL IVPB 1 g 200 mL/hr   08/23/15 0541 Given   meropenem (MERREM) 1 g in sodium chloride 0.9 % 100 mL IVPB 1 g 200 mL/hr   08/23/15 1221 Given   cefTRIAXone (ROCEPHIN) 2 g in dextrose 5 % 50 mL IVPB 2 g 100 mL/hr   08/24/15 0929 Given   vancomycin (VANCOCIN) IVPB 1000 mg/200 mL premix 1,000 mg 200 mL/hr      MEDICATIONS: . aspirin EC  81 mg Oral Daily  . calcium carbonate  1 tablet Oral Daily  . calcium gluconate  2 g Intravenous Once  . cefTRIAXone (ROCEPHIN)  IV  2 g Intravenous Q24H  . clonazePAM  1 mg Oral BID  . enoxaparin (LOVENOX) injection  40 mg Subcutaneous Q24H  . gabapentin  300 mg Oral TID  . levETIRAcetam  500 mg Oral q morning - 10a   And  . levETIRAcetam  1,000 mg Oral QHS  . sodium chloride  3 mL Intravenous Q12H  . vancomycin  1,000 mg Intravenous Q18H  Review of Systems - 11 systems reviewed and negative per HPI   OBJECTIVE: Temp:  [98.6 F (37 C)-102.9 F (39.4 C)] 98.6 F (37 C) (12/23 1304) Pulse Rate:  [75-105] 75 (12/23 1304) Resp:  [16-19] 16 (12/23 1304) BP: (147-176)/(72-127) 157/78 mmHg (12/23 1304) SpO2:  [96 %-100 %] 100 % (12/23 1304) Physical Exam  Constitutional:  Dishelved, chronically ill appearing.  HENT: [place]/AT, PERRLA, no scleral icterus Mouth/Throat: Oropharynx is clear and dry. No oropharyngeal exudate.  Cardiovascular: Normal rate, regular rhythm and normal heart sounds.  Pulmonary/Chest: bil rhonchi Neck  supple, no nuchal rigidity Abdominal: Soft. Bowel sounds are normal.  exhibits no distension. There is mild LUQ and LLQ tenderness. NO CVA tenderness Lymphadenopathy: no cervical adenopathy. No axillary adenopathy Neurological: alert and oriented  to person, place, and time. Poor historian Skin: Skin is warm and dry. Some dry scaly skin, no open wounds Psychiatric: a normal mood and affect.   PICC RLU WNL   LABS: Results for orders placed or performed during the hospital encounter of 08/20/15 (from the past 48 hour(s))  C difficile quick scan w PCR reflex     Status: None   Collection Time: 08/23/15  2:00 AM  Result Value Ref Range   C Diff antigen NEGATIVE NEGATIVE   C Diff toxin NEGATIVE NEGATIVE   C Diff interpretation Negative for C. difficile   Basic metabolic panel     Status: Abnormal   Collection Time: 08/23/15  5:33 AM  Result Value Ref Range   Sodium 137 135 - 145 mmol/L   Potassium 3.2 (L) 3.5 - 5.1 mmol/L   Chloride 101 101 - 111 mmol/L   CO2 29 22 - 32 mmol/L   Glucose, Bld 96 65 - 99 mg/dL   BUN 8 6 - 20 mg/dL   Creatinine, Ser 0.64 0.44 - 1.00 mg/dL   Calcium 7.0 (L) 8.9 - 10.3 mg/dL   GFR calc non Af Amer >60 >60 mL/min   GFR calc Af Amer >60 >60 mL/min    Comment: (NOTE) The eGFR has been calculated using the CKD EPI equation. This calculation has not been validated in all clinical situations. eGFR's persistently <60 mL/min signify possible Chronic Kidney Disease.    Anion gap 7 5 - 15  CK     Status: Abnormal   Collection Time: 08/23/15  5:33 AM  Result Value Ref Range   Total CK 7461 (H) 38 - 234 U/L  Ammonia     Status: Abnormal   Collection Time: 08/23/15  3:30 PM  Result Value Ref Range   Ammonia 57 (H) 9 - 35 umol/L  Basic metabolic panel     Status: Abnormal   Collection Time: 08/24/15  5:43 AM  Result Value Ref Range   Sodium 135 135 - 145 mmol/L   Potassium 5.7 (H) 3.5 - 5.1 mmol/L   Chloride 107 101 - 111 mmol/L   CO2 26 22 - 32 mmol/L   Glucose, Bld 84 65 - 99 mg/dL   BUN 5 (L) 6 - 20 mg/dL   Creatinine, Ser 0.50 0.44 - 1.00 mg/dL   Calcium 6.0 (LL) 8.9 - 10.3 mg/dL    Comment: CRITICAL RESULT CALLED TO, READ BACK BY AND VERIFIED WITH MICHAEL JACOBS @ 1018 08/24/15 BY TCH     GFR calc non Af Amer >60 >60 mL/min   GFR calc Af Amer >60 >60 mL/min    Comment: (NOTE) The eGFR has been calculated using the CKD EPI equation. This  calculation has not been validated in all clinical situations. eGFR's persistently <60 mL/min signify possible Chronic Kidney Disease.    Anion gap 2 (L) 5 - 15  CK     Status: Abnormal   Collection Time: 08/24/15  5:43 AM  Result Value Ref Range   Total CK 3069 (H) 38 - 234 U/L   No components found for: ESR, C REACTIVE PROTEIN MICRO: Recent Results (from the past 720 hour(s))  Culture, blood (Routine X 2) w Reflex to ID Panel     Status: None (Preliminary result)   Collection Time: 08/20/15  6:30 PM  Result Value Ref Range Status   Specimen Description BLOOD LEFT ASSIST CONTROL  Final   Special Requests BOTTLES DRAWN AEROBIC AND ANAEROBIC 1CC  Final   Culture NO GROWTH 4 DAYS  Final   Report Status PENDING  Incomplete  Culture, blood (Routine X 2) w Reflex to ID Panel     Status: None   Collection Time: 08/20/15  6:47 PM  Result Value Ref Range Status   Specimen Description BLOOD RIGHT ASSIST CONTROL  Final   Special Requests   Final    BOTTLES DRAWN AEROBIC AND ANAEROBIC [place] AERO 5 CC ANA   Culture  Setup Time   Final    GRAM NEGATIVE RODS CRITICAL RESULT CALLED TO, READ BACK BY AND VERIFIED WITH: CHRISTINE KATSOUDAS AT 5176 ON 08/21/15 CTJ    Culture ESCHERICHIA COLI  Final   Report Status 08/23/2015 FINAL  Final   Organism ID, Bacteria ESCHERICHIA COLI  Final      Susceptibility   Escherichia coli - MIC*    AMPICILLIN >=32 RESISTANT Resistant     CEFTAZIDIME <=1 SENSITIVE Sensitive     CEFAZOLIN <=4 SENSITIVE Sensitive     CEFTRIAXONE <=1 SENSITIVE Sensitive     CIPROFLOXACIN <=0.25 SENSITIVE Sensitive     GENTAMICIN <=1 SENSITIVE Sensitive     IMIPENEM <=0.25 SENSITIVE Sensitive     TRIMETH/SULFA <=20 SENSITIVE Sensitive     PIP/TAZO Value in next row Sensitive      SENSITIVE<=4    * ESCHERICHIA COLI  Blood  Culture ID Panel (Reflexed)     Status: Abnormal   Collection Time: 08/20/15  6:47 PM  Result Value Ref Range Status   Enterococcus species NOT DETECTED NOT DETECTED Final   Listeria monocytogenes NOT DETECTED NOT DETECTED Final   Staphylococcus species NOT DETECTED NOT DETECTED Final   Staphylococcus aureus NOT DETECTED NOT DETECTED Final   Streptococcus species NOT DETECTED NOT DETECTED Final   Streptococcus agalactiae NOT DETECTED NOT DETECTED Final   Streptococcus pneumoniae NOT DETECTED NOT DETECTED Final   Streptococcus pyogenes NOT DETECTED NOT DETECTED Final   Acinetobacter baumannii NOT DETECTED NOT DETECTED Final   Enterobacteriaceae species NOT DETECTED NOT DETECTED Final   Enterobacter cloacae complex NOT DETECTED NOT DETECTED Final   Escherichia coli DETECTED (A) NOT DETECTED Final    Comment: CRITICAL RESULT CALLED TO AND VERIFIED BY: CHRISTINE KATSOUDAS AT 1607 ON 08/21/15 CTJ   Klebsiella oxytoca NOT DETECTED NOT DETECTED Final   Klebsiella pneumoniae NOT DETECTED NOT DETECTED Final   Proteus species NOT DETECTED NOT DETECTED Final   Serratia marcescens NOT DETECTED NOT DETECTED Final   Haemophilus influenzae NOT DETECTED NOT DETECTED Final   Neisseria meningitidis NOT DETECTED NOT DETECTED Final   Pseudomonas aeruginosa NOT DETECTED NOT DETECTED Final   Candida albicans NOT DETECTED NOT DETECTED Final   Candida glabrata NOT DETECTED NOT DETECTED Final  Candida krusei NOT DETECTED NOT DETECTED Final   Candida parapsilosis NOT DETECTED NOT DETECTED Final   Candida tropicalis NOT DETECTED NOT DETECTED Final   Carbapenem resistance NOT DETECTED NOT DETECTED Final   Methicillin resistance NOT DETECTED NOT DETECTED Final   Vancomycin resistance NOT DETECTED NOT DETECTED Final  C difficile quick scan w PCR reflex     Status: None   Collection Time: 08/23/15  2:00 AM  Result Value Ref Range Status   C Diff antigen NEGATIVE NEGATIVE Final   C Diff toxin NEGATIVE NEGATIVE  Final   C Diff interpretation Negative for C. difficile  Final    IMAGING: Dg Chest 1 View  08/21/2015  CLINICAL DATA:  Repositioning of central catheter EXAM: CHEST 1 VIEW COMPARISON:  Study obtained earlier in the day. FINDINGS: Central catheter tip is now in the superior vena cava slightly proximal to the cavoatrial junction. No pneumothorax. Atelectasis remains in the lingula. Lungs elsewhere clear. Heart is prominent bus table. No adenopathy. No bone lesions. IMPRESSION: Stable lingular atelectasis. No new opacity. No change in cardiac silhouette. Central catheter tip is now in the superior vena cava. No pneumothorax. Electronically Signed   By: Lowella Grip III M.D.   On: 08/21/2015 13:50   Ct Head Wo Contrast  08/20/2015  CLINICAL DATA:  64 year old female with altered mental status EXAM: CT HEAD WITHOUT CONTRAST TECHNIQUE: Contiguous axial images were obtained from the base of the skull through the vertex without intravenous contrast. COMPARISON:  None. FINDINGS: Negative for acute intracranial hemorrhage, acute infarction, mass, mass effect, hydrocephalus or midline shift. Gray-white differentiation is preserved throughout. 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 No focal soft tissue or calvarial abnormality. Globes and orbits are unremarkable bilaterally. Normal aeration of the mastoid air cells and visualized paranasal sinuses. Atherosclerotic calcifications in both cavernous carotid arteries. IMPRESSION: 1. No acute intracranial abnormality. 2. 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 3. Intracranial atherosclerosis. Electronically Signed   By: Jacqulynn Cadet M.D.   On: 08/20/2015 19:29   Dg Chest Port 1 View  08/21/2015  CLINICAL DATA:  Central catheter placement EXAM: PORTABLE CHEST 1 VIEW COMPARISON:  August 30, 2015 FINDINGS: Central catheter tip is in the right atrium,  approximately 8 cm distal to the cavoatrial junction. No pneumothorax. There is persistent atelectasis in the inferior lingula. The lungs elsewhere clear. Heart is slightly enlarged but stable. The pulmonary vascularity is normal. No adenopathy. No bone lesions. IMPRESSION: Central catheter tip in right atrium. No pneumothorax. Persistent lingular atelectatic change. No new opacity in the lungs. Stable mild cardiac enlargement. Electronically Signed   By: Lowella Grip III M.D.   On: 08/21/2015 12:45   Dg Chest Port 1 View  08/20/2015  CLINICAL DATA:  Altered mental status today, patient smokes EXAM: PORTABLE CHEST 1 VIEW COMPARISON:  None. FINDINGS: Mild cardiac silhouette enlargement. Vascular pattern normal. Right lung clear. Band of opacity laterally in the lingula appears most consistent with subsegmental atelectasis. IMPRESSION: Mild subsegmental atelectasis in the lingula ; cannot exclude possibility that this may be due to central obstructing process although mediastinal and hilar contours appear within normal limits on this single AP view. Correlate clinically. Electronically Signed   By: Skipper Cliche M.D.   On: 08/20/2015 18:48    Assessment:   Anne Phillips is a 64 y.o. female with history of seizure disorder, coronary artery disease status post stent placement, medical noncompliance, ongoing tobacco abuse admitted  12/19  with altered mental status. Per family at that time she had a gradual decline over several months. She on admit had temp 104.2 and wbc13 and rhabdo with CPK 20K.   Started on meropenem and vanco and has had BCX + for E coli. UA was TNTC wbc and wbc clumps. Flu negative. Had AST 159 but other lfts nml (likely rhabdo)  Clinically improving but continues with recurrent high grade fevers.  She smokes 3ppd, denies etoh, says she lives alone at home. Denies having any sxs prior to ending up in the ED- cannot recall event surrounding her admission. Denies, HA, neck  stiffness.   With E coli bacteremia and a + UA she likely has pyelonephritis although she also has some LLQ ttp so could have diverticulitis  Recommendations Check HIV Continue ceftriaxone Check CT abd pelvis to eval for source of infection  BCX pending - if negative no need to remove picc -if + remove picc.   Thank you very much for allowing me to participate in the care of this patient. Please call with questions.   Cheral Marker. Ola Spurr, MD

## 2015-08-24 NOTE — Progress Notes (Signed)
Initial Nutrition Assessment     INTERVENTION:  Meals and snacks: Cater to pt preferences, requesting fresh fruit, currently on soft (low fiber diet) with CT of abdomen pending.  If negative may benefit from heart healthy diet. Medical Nutrition Supplement Therapy: will add mightyshake BID for added nutrition   NUTRITION DIAGNOSIS:   Inadequate oral intake related to acute illness as evidenced by meal completion < 25%.    GOAL:   Patient will meet greater than or equal to 90% of their needs    MONITOR:    (Energy intake, Digestive system, Electrolyte and renal profile)  REASON FOR ASSESSMENT:   LOS    ASSESSMENT:      Pt admitted with AMS, fever, sepsis UTI. Planning CT of abdomen  Past Medical History  Diagnosis Date  . Seizures ([place])   . Thyroid disease   . Coronary artery disease   . Hypertension   . Hyperlipidemia     Current Nutrition: eating bites per pt  Food/Nutrition-Related History: pt reports appetite has been down prior to admission but does not know for how long   Scheduled Medications:  . aspirin EC  81 mg Oral Daily  . [START ON 08/25/2015] calcium carbonate  1 tablet Oral Daily  . calcium gluconate  2 g Intravenous Once  . cefTRIAXone (ROCEPHIN)  IV  2 g Intravenous Q24H  . clonazePAM  1 mg Oral BID  . enoxaparin (LOVENOX) injection  40 mg Subcutaneous Q24H  . gabapentin  300 mg Oral TID  . levETIRAcetam  500 mg Oral q morning - 10a   And  . levETIRAcetam  1,000 mg Oral QHS  . sodium chloride  3 mL Intravenous Q12H       Electrolyte/Renal Profile and Glucose Profile:   Recent Labs Lab 08/22/15 1405 08/23/15 0533 08/24/15 0543  NA 133* 137 135  K 3.4* 3.2* 5.7*  CL 103 101 107  CO2 25 29 26   BUN 10 8 5*  CREATININE 0.72 0.64 0.50  CALCIUM 7.0* 7.0* 6.0*  GLUCOSE 105* 96 84   Protein Profile:  Recent Labs Lab 08/20/15 1829  ALBUMIN 3.9    Gastrointestinal Profile: Last BM:12/23   Nutrition-Focused Physical Exam  Findings: Nutrition-Focused physical exam completed. Findings are WDL for fat depletion, muscle depletion, and unable to assess  edema.     Weight Change: pt thinks she has lost wt but not sure how much    Diet Order:  DIET SOFT Room service appropriate?: Yes; Fluid consistency:: Thin  Skin:   reviewed      Height:   Ht Readings from Last 1 Encounters:  08/20/15 5\' 2"  (1.575 m)    Weight:   Wt Readings from Last 1 Encounters:  08/21/15 144 lb 14.4 oz (65.726 kg)    Ideal Body Weight:     BMI:  Body mass index is 26.5 kg/(m^2).  Estimated Nutritional Needs:   Kcal:  BEE 1168 kcals (IF 1.0-1.3, AF 1.3) [id number] kcals/d.   Protein:  (1.0-1.3 g/kg) 66-86 g/d  Fluid:  (30-77ml/kg) 1980-2326ml/d  EDUCATION NEEDS:   No education needs identified at this time  [place]. Zenia Resides, [place], [place] (pager) Weekend/On-Call pager [phone])

## 2015-08-24 NOTE — Progress Notes (Signed)
Per Tammy ([facility]) they cannot offer patient a LOG bed at this time. Tammy reported that it is unclear if patient will qualify for disability Medicaid. Per Tammy she called patient's attorney who reported that the disability application is still pending. Per [facility] admissions coordinator at [facility] they cannot offer a LOG bed. CSW discussed case with Clinical Social Work Social worker. Per [facility] patient will need to continue to work with PT at the hospital to see if she can improve to go home. CSW contacted PT and asked him to see patient today if possible. Per MD patient is not medically stable and will not D/C over the weekend. CSW will continue to follow and assist as needed.   Blima Rich, [place] [phone]

## 2015-08-24 NOTE — Progress Notes (Signed)
Notified Dr Tressia Miners of pt critical value of calcium level 6.0; Dr acknowledged, no new orders; also told Dr that pt stated she was having "seizures in my left arm"; pt asymptomatic, no s/s of seizure; Dr acknowledged, stated she was aware of this pt complaint, no new orders

## 2015-08-24 NOTE — Progress Notes (Signed)
[place] [facility] [place] NAME: Anne Phillips    MR#:  [id number]  DATE OF BIRTH:  [birth year]  SUBJECTIVE:  CHIEF COMPLAINT:   Chief Complaint  Patient presents with  . Altered Mental Status   -remains sleepy, but arousable and answering questions - continues to spike fevers  REVIEW OF SYSTEMS:  Review of Systems  Constitutional: Positive for fever and malaise/fatigue. Negative for chills.  HENT: Negative for ear discharge, ear pain and tinnitus.   Eyes: Negative for blurred vision.  Respiratory: Negative for cough, shortness of breath and wheezing.   Cardiovascular: Negative for chest pain, palpitations and leg swelling.  Gastrointestinal: Positive for diarrhea. Negative for nausea, vomiting, abdominal pain and constipation.  Genitourinary: Negative for dysuria.  Musculoskeletal: Positive for myalgias.  Neurological: Negative for dizziness, sensory change, speech change, seizures and headaches.  Psychiatric/Behavioral: Negative for depression.       Confusion present    DRUG ALLERGIES:  No Known Allergies  VITALS:  Blood pressure 157/78, pulse 75, temperature 98.6 F (37 C), temperature source Oral, resp. rate 16, height 5\' 2"  (1.575 m), weight 65.726 kg (144 lb 14.4 oz), SpO2 100 %.  PHYSICAL EXAMINATION:  Physical Exam  GENERAL:  64 y.o.-year-old patient lying in the bed with no acute distress.  EYES: Pupils equal, round, reactive to light and accommodation. No scleral icterus. Extraocular muscles intact.  HEENT: Head atraumatic, normocephalic. Oropharynx and nasopharynx clear.  NECK:  Supple, no jugular venous distention. No thyroid enlargement, no tenderness.  LUNGS: Normal breath sounds bilaterally, no wheezing, rales,rhonchi or crepitation. No use of accessory muscles of respiration. Decreased bibasilar breath sounds. CARDIOVASCULAR: S1, S2 normal. No  rubs, or gallops. 3/6 systolic murmur present ABDOMEN: Soft,  nontender, nondistended. Bowel sounds present. No organomegaly or mass.  EXTREMITIES: No pedal edema, cyanosis, or clubbing.  NEUROLOGIC: Cranial nerves II through XII are intact. Muscle strength 5/5 in all extremities. Sensation intact. Gait not checked.  PSYCHIATRIC: The patient is sleepy, arousable, but oriented only to self SKIN: No obvious rash, lesion, or ulcer.    LABORATORY PANEL:   CBC  Recent Labs Lab 08/22/15 1405  WBC 6.2  HGB 10.5*  HCT 32.1*  PLT 126*   ------------------------------------------------------------------------------------------------------------------  Chemistries   Recent Labs Lab 08/20/15 1829  08/24/15 0543  NA 134*  < > 135  K 2.5*  < > 5.7*  CL 91*  < > 107  CO2 31  < > 26  GLUCOSE 109*  < > 84  BUN 17  < > 5*  CREATININE 0.97  < > 0.50  CALCIUM 8.3*  < > 6.0*  AST 159*  --   --   ALT 19  --   --   ALKPHOS 121  --   --   BILITOT 1.0  --   --   < > = values in this interval not displayed. ------------------------------------------------------------------------------------------------------------------  Cardiac Enzymes  Recent Labs Lab 08/21/15 1730  TROPONINI 0.69*   ------------------------------------------------------------------------------------------------------------------  RADIOLOGY:  No results found.  EKG:   Orders placed or performed during the hospital encounter of 08/20/15  . ED EKG  . ED EKG  . EKG 12-Lead  . EKG 12-Lead    ASSESSMENT AND PLAN:   64 year old female with past medical history significant for coronary artery disease status post stents, hypertension, seizure disorder, ongoing smoking presents to the hospital secondary to fall. Noted to be septic and also has rhabdomyolysis.  #1 sepsis- likely source  urine. -no urine cultures were sent on admission  -Blood cultures growing Escherichia coli , not ESBL. on rocephin for now - Due to continuing fevers- repeat cultures ordered. Also patient has  a PICC line - vancomycin added, ID consulted - CT abd and pelvis to r/o pyelonephritis - also dilantin can cause idiosyncratic fevers- but she just received a dose last night after stopping for 2days - Monitor closely  #2 elevated troponins-likely demand ischemia. Troponins have plateaued. -No chest pain. Also from rhabdomyolysis. - echo with mild LVH and no wall motion abnormalities. Cardiology consulted. No further cardiac workup needed  #3 rhabdomyolysis-likely from fall. Patient not sure if she had a seizure prior to the fall or not. Has been having multiple falls lately. -Will need rehabilitation at discharge. Not safe to go back home by herself. -IV fluids and continue to monitor CK levels. CK level improving  #4 seizure disorder-known history of seizure disorder -Follows with Dr. Celesta Aver at [facility] -received dilantin yesterday but free level came elevated, continue to hold dilantin -She needs to be seen at an epilepsy center. -Appreciate neurology consult. -Continue her keppra and Neurontin. keppra level within normal limits   #5 Hyperkalemia- likely iatrogenic as patient receiving fluids with potassium and also replacements yesterday - fluids stopped, recheck later today Hypocalcemia- being replaced  #6 metabolic encephalopathy-likely from her sepsis and also elevated Dilantin level. -Continues to improve at this point. -CT of the head negative for any acute findings. -Continue to monitor.  #7 DVT prophylaxis-on subcutaneous Lovenox   Physical therapy consulted and they have recommended rehabilitation. Discharge once mental status improves and also fevers resolve   All the records are reviewed and case discussed with Care Management/Social Workerr. Management plans discussed with the patient, family and they are in agreement.  CODE STATUS: Full code  TOTAL TIME TAKING CARE OF THIS PATIENT: 42 minutes.   POSSIBLE D/C IN 1-2 DAYS, DEPENDING ON CLINICAL  CONDITION.   Gladstone Lighter M.D on 08/24/2015 at 3:55 PM  Between 7am to 6pm - Pager - [phone]  After 6pm go to [email or web address] - password [email or web address] [facility]  Office  [phone]  CC: Primary care physician; Pcp Not In System

## 2015-08-24 NOTE — Progress Notes (Signed)
PT Cancellation Note  Patient Details Name: JORIAN MOORE MRN: [id number] DOB: 25-Jul-1951   Cancelled Treatment:    Reason Eval/Treat Not Completed: Patient not medically ready. CSW asked PT to attempt mobility session with patient, upon chart review patient noted to have critical lab values of K+ (5.7) and Ca+2 (6.0). Given her clinical picture and these critical lab values, PT will defer mobility session as patient can not safely participate fully in session. PT will continue to follow patient and re-attempt as able and available.   Kerman Passey, PT, DPT    08/24/2015, 3:14 PM

## 2015-08-24 NOTE — Progress Notes (Signed)
Clinical Education officer, museum (CSW) contacted the following facilities to follow up on the LOG bed:  [place]: Lynelle Smoke is reviewing patient's financial information.   [facility]: Left voicemail for [facility].   [facility]: Per [facility] admissions coordinator they cannot accept an LOG but gave CSW fax number to [place] for the [employer]. CSW faxed SNF referral to [facility].   [place]: Per [facility] admissions coordinator she will review referral.   CSW will continue to follow and assist as needed.   Blima Rich, [place] [phone]

## 2015-08-25 LAB — BASIC METABOLIC PANEL
ANION GAP: 3 — AB (ref 5–15)
BUN: <5 mg/dL — ABNORMAL LOW (ref 6–20)
CO2: 32 mmol/L (ref 22–32)
Calcium: 7.3 mg/dL — ABNORMAL LOW (ref 8.9–10.3)
Chloride: 100 mmol/L — ABNORMAL LOW (ref 101–111)
Creatinine, Ser: 0.59 mg/dL (ref 0.44–1.00)
Glucose, Bld: 89 mg/dL (ref 65–99)
POTASSIUM: 3.4 mmol/L — AB (ref 3.5–5.1)
SODIUM: 135 mmol/L (ref 135–145)

## 2015-08-25 LAB — TROPONIN I: Troponin I: 0.08 ng/mL — ABNORMAL HIGH (ref <0.031)

## 2015-08-25 LAB — CULTURE, BLOOD (ROUTINE X 2): Culture: NO GROWTH

## 2015-08-25 LAB — AMMONIA: Ammonia: 29 umol/L (ref 9–35)

## 2015-08-25 LAB — CK: CK TOTAL: 1658 U/L — AB (ref 38–234)

## 2015-08-25 MED ORDER — SODIUM CHLORIDE 0.9 % IV SOLN
INTRAVENOUS | Status: DC
  Administered 2015-08-25 – 2015-08-27 (×4): via INTRAVENOUS

## 2015-08-25 MED ORDER — NITROGLYCERIN 2 % TD OINT
0.5000 [in_us] | TOPICAL_OINTMENT | Freq: Four times a day (QID) | TRANSDERMAL | Status: DC
  Administered 2015-08-25 – 2015-08-28 (×12): 0.5 [in_us] via TOPICAL
  Filled 2015-08-25 (×13): qty 1

## 2015-08-25 NOTE — Progress Notes (Signed)
[facility] [place] NAME: Anne Phillips    MR#:  [id number]  DATE OF BIRTH:  Sep 10, 1950  SUBJECTIVE:  CHIEF COMPLAINT:   Chief Complaint  Patient presents with  . Altered Mental Status   -More alert today. Oriented 3 this morning. -But appears very weak. Still has the Foley catheter. -No fevers noted since yesterday.  REVIEW OF SYSTEMS:  Review of Systems  Constitutional: Positive for fever and malaise/fatigue. Negative for chills.  HENT: Negative for ear discharge, ear pain and tinnitus.   Eyes: Negative for blurred vision.  Respiratory: Negative for cough, shortness of breath and wheezing.   Cardiovascular: Negative for chest pain, palpitations and leg swelling.  Gastrointestinal: Negative for nausea, vomiting, abdominal pain, diarrhea and constipation.  Genitourinary: Negative for dysuria.  Musculoskeletal: Positive for myalgias.  Neurological: Negative for dizziness, sensory change, speech change, seizures and headaches.  Psychiatric/Behavioral: Negative for depression.       Confusion present    DRUG ALLERGIES:  No Known Allergies  VITALS:  Blood pressure 176/79, pulse 88, temperature 98.7 F (37.1 C), temperature source Oral, resp. rate 20, height 5\' 2"  (1.575 m), weight 65.726 kg (144 lb 14.4 oz), SpO2 100 %.  PHYSICAL EXAMINATION:  Physical Exam  GENERAL:  64 y.o.-year-old patient lying in the bed with no acute distress.  EYES: Pupils equal, round, reactive to light and accommodation. No scleral icterus. Extraocular muscles intact.  HEENT: Head atraumatic, normocephalic. Oropharynx and nasopharynx clear.  NECK:  Supple, no jugular venous distention. No thyroid enlargement, no tenderness.  LUNGS: Normal breath sounds bilaterally, no wheezing, rales,rhonchi or crepitation. No use of accessory muscles of respiration. Decreased bibasilar breath sounds. CARDIOVASCULAR: S1, S2 normal. No  rubs, or gallops. 3/6  systolic murmur present ABDOMEN: Soft, nontender, nondistended. Bowel sounds present. No organomegaly or mass.  EXTREMITIES: No pedal edema, cyanosis, or clubbing.  NEUROLOGIC: Cranial nerves II through XII are intact. Muscle strength 5/5 in all extremities. Sensation intact. Gait not checked.  PSYCHIATRIC: The patient is alert and oriented 3. Generalized weakness noted. SKIN: No obvious rash, lesion, or ulcer.    LABORATORY PANEL:   CBC  Recent Labs Lab 08/22/15 1405  WBC 6.2  HGB 10.5*  HCT 32.1*  PLT 126*   ------------------------------------------------------------------------------------------------------------------  Chemistries   Recent Labs Lab 08/20/15 1829  08/25/15 0636  NA 134*  < > 135  K 2.5*  < > 3.4*  CL 91*  < > 100*  CO2 31  < > 32  GLUCOSE 109*  < > 89  BUN 17  < > <5*  CREATININE 0.97  < > 0.59  CALCIUM 8.3*  < > 7.3*  AST 159*  --   --   ALT 19  --   --   ALKPHOS 121  --   --   BILITOT 1.0  --   --   < > = values in this interval not displayed. ------------------------------------------------------------------------------------------------------------------  Cardiac Enzymes  Recent Labs Lab 08/21/15 1730  TROPONINI 0.69*   ------------------------------------------------------------------------------------------------------------------  RADIOLOGY:  Ct Abdomen Pelvis W Contrast  08/24/2015  CLINICAL DATA:  Bacteremia due to Escherichia with mild LUQ and LLQ tenderness EXAM: CT ABDOMEN AND PELVIS WITH CONTRAST TECHNIQUE: Multidetector CT imaging of the abdomen and pelvis was performed using the standard protocol following bolus administration of intravenous contrast. CONTRAST:  191mL OMNIPAQUE IOHEXOL 300 MG/ML  SOLN COMPARISON:  None available FINDINGS: Small bilateral pleural effusions. Adjacent consolidation/ atelectasis posteriorly in the  lung bases. Moderate coronary calcifications. Mild cardiomegaly, incompletely visualized. Somewhat  lobulated 3.3 cm fluid attenuation lesion in hepatic segment 8 with some thin internal septations, no adjacent parenchymal enhancement abnormalities . No other liver lesion. Gallbladder is nondilated. Unremarkable spleen. 1 cm enhancing right adrenal nodule. Lobular left adrenal gland. There is diffuse mild urothelial prominence in the central right renal collecting system and mild right ureterectasis in its proximal and mid segments, but no renal or ureteral calculus. Foley catheter partially decompresses the urinary bladder, which contains a small amount of gas presumably from instrumentation. Renal parenchyma unremarkable. Symmetric bilateral renal excretion on delayed scans. Stomach, small bowel, and colon are nondilated. Normal appendix containing contrast. No ascites. No free air. No adenopathy. Portal vein patent. Scattered aortoiliac calcified plaque without aneurysm or stenosis. Mild presacral soft tissue edema, poorly marginated. No evidence of abscess. IMPRESSION: 1. Urothelial prominence in the right renal collecting system and proximal ureter. Correlate with any clinical or laboratory evidence of UTI. 2. Small bilateral pleural effusions with adjacent consolidation/atelectasis in the lung bases. 3. Atherosclerosis, including aortoiliac and coronary artery disease. Please note that although the presence of coronary artery calcium documents the presence of coronary artery disease, the severity of this disease and any potential stenosis cannot be assessed on this non-gated CT examination. Assessment for potential risk factor modification, dietary therapy or pharmacologic therapy may be warranted, if clinically indicated. 4. 3.3 cm  septated cyst, hepatic segment 8. Electronically Signed   By: Lucrezia Europe M.D.   On: 08/24/2015 19:28    EKG:   Orders placed or performed during the hospital encounter of 08/20/15  . ED EKG  . ED EKG  . EKG 12-Lead  . EKG 12-Lead    ASSESSMENT AND PLAN:    64 year old female with past medical history significant for coronary artery disease status post stents, hypertension, seizure disorder, ongoing smoking presents to the hospital secondary to fall. Noted to be septic and also has rhabdomyolysis.  #1 sepsis- likely source urine. -no urine cultures were sent on admission  -Blood cultures growing Escherichia coli , not ESBL. on rocephin for now - Due to continuing fevers- repeat cultures done on 08/23/2015. Continue vancomycin until cultures are negative. If cultures are positive, PICC line needs to be removed. -If cultures are negative PICC line can stay in. -ID consult is appreciated. CT of the abdomen with no abscess or other infection and no pyelonephritis.-  - also dilantin can cause idiosyncratic fevers- discontinued Dilantin as well - Monitor closely  #2 elevated troponins-likely demand ischemia. Troponins have plateaued. -No chest pain. Also from rhabdomyolysis. Improving CPK - echo with mild LVH and no wall motion abnormalities. Cardiology consulted. No further cardiac workup needed  #3 rhabdomyolysis-likely from fall. Patient not sure if she had a seizure prior to the fall or not. Has been having multiple falls lately. -Will need rehabilitation at discharge. Not safe to go back home by herself. -IV fluids and continue to monitor CK levels. CK level improving  #4 seizure disorder-known history of seizure disorder -Follows with Dr. Celesta Aver at [facility] - Dilantin level elevated on admission and also free Dilantin level elevated. Patient might need a lower dose of Dilantin at discharge. Or other medications need to be added. No seizures here in the hospital. -She needs to be seen at an epilepsy center. -Appreciate neurology consult. -Continue her keppra and Neurontin. keppra level within normal limits   #5 Hyperkalemia- likely iatrogenic as patient got  fluids with potassium and also  replacements - Normalized now Hypocalcemia-  replaced and level has improved  #6 metabolic encephalopathy-likely from her sepsis and also elevated Dilantin level. -Continues to improve at this point. -CT of the head negative for any acute findings. -Continue to monitor.  #7 DVT prophylaxis-on subcutaneous Lovenox   Physical therapy consulted and they have recommended rehabilitation. Encourage ambulation and sitting in the chair today.   All the records are reviewed and case discussed with Care Management/Social Workerr. Management plans discussed with the patient, family and they are in agreement.  CODE STATUS: Full code  TOTAL TIME TAKING CARE OF THIS PATIENT: 59minutes.   POSSIBLE D/C IN 1-2 DAYS, DEPENDING ON CLINICAL CONDITION.   Gladstone Lighter M.D on 08/25/2015 at 11:21 AM  Between 7am to 6pm - Pager - [phone]  After 6pm go to [email or web address] - password [email or web address] [facility]  Office  [phone]  CC: Primary care physician; Pcp Not In System

## 2015-08-25 NOTE — Progress Notes (Signed)
Dr. Margaretmary Eddy notified of pt's trop level of 0.08. Pt is resting at this time. No new orders.

## 2015-08-26 LAB — URINALYSIS COMPLETE WITH MICROSCOPIC (ARMC ONLY)
Bilirubin Urine: NEGATIVE
GLUCOSE, UA: NEGATIVE mg/dL
KETONES UR: NEGATIVE mg/dL
NITRITE: NEGATIVE
PROTEIN: NEGATIVE mg/dL
SPECIFIC GRAVITY, URINE: 1.009 (ref 1.005–1.030)
pH: 7 (ref 5.0–8.0)

## 2015-08-26 LAB — BASIC METABOLIC PANEL
ANION GAP: 7 (ref 5–15)
CALCIUM: 6.4 mg/dL — AB (ref 8.9–10.3)
CO2: 27 mmol/L (ref 22–32)
Chloride: 104 mmol/L (ref 101–111)
Creatinine, Ser: 0.45 mg/dL (ref 0.44–1.00)
GFR calc Af Amer: >60 mL/min (ref >60)
Glucose, Bld: 79 mg/dL (ref 65–99)
POTASSIUM: 3.1 mmol/L — AB (ref 3.5–5.1)
SODIUM: 138 mmol/L (ref 135–145)

## 2015-08-26 LAB — CULTURE, BLOOD (ROUTINE X 2)

## 2015-08-26 LAB — CK: Total CK: 805 U/L — ABNORMAL HIGH (ref 38–234)

## 2015-08-26 LAB — ALBUMIN: ALBUMIN: 2.2 g/dL — AB (ref 3.5–5.0)

## 2015-08-26 MED ORDER — POTASSIUM CHLORIDE CRYS ER 20 MEQ PO TBCR
40.0000 meq | EXTENDED_RELEASE_TABLET | ORAL | Status: AC
  Administered 2015-08-26 (×2): 40 meq via ORAL
  Filled 2015-08-26 (×2): qty 2

## 2015-08-26 MED ORDER — SODIUM CHLORIDE 0.9 % IV SOLN
2.0000 g | Freq: Once | INTRAVENOUS | Status: AC
  Administered 2015-08-26: 2 g via INTRAVENOUS
  Filled 2015-08-26: qty 20

## 2015-08-26 NOTE — Progress Notes (Signed)
Dr. Jannifer Franklin notified of pt's critical Ca+ level 6.4. No new orders notified.

## 2015-08-26 NOTE — Progress Notes (Signed)
[facility] NAME: Anne Phillips    MR#:  [id number]  DATE OF BIRTH:  22-Jan-1951  SUBJECTIVE:  CHIEF COMPLAINT:   Chief Complaint  Patient presents with  . Altered Mental Status   -Sleeping most of the time., When aroused, patient is alert and oriented. -Foley catheter discontinued and she is voiding fine.  REVIEW OF SYSTEMS:  Review of Systems  Constitutional: Positive for malaise/fatigue. Negative for fever and chills.  HENT: Negative for ear discharge, ear pain and tinnitus.   Eyes: Negative for blurred vision.  Respiratory: Negative for cough, shortness of breath and wheezing.   Cardiovascular: Negative for chest pain, palpitations and leg swelling.  Gastrointestinal: Negative for nausea, vomiting, abdominal pain, diarrhea and constipation.  Genitourinary: Negative for dysuria.  Musculoskeletal: Positive for myalgias.  Neurological: Negative for dizziness, sensory change, speech change, seizures and headaches.  Psychiatric/Behavioral: Negative for depression.       Confusion present    DRUG ALLERGIES:  No Known Allergies  VITALS:  Blood pressure 151/68, pulse 95, temperature 97.6 F (36.4 C), temperature source Oral, resp. rate 18, height 5\' 2"  (1.575 m), weight 65.726 kg (144 lb 14.4 oz), SpO2 97 %.  PHYSICAL EXAMINATION:  Physical Exam  GENERAL:  64 y.o.-year-old patient lying in the bed with no acute distress.  EYES: Pupils equal, round, reactive to light and accommodation. No scleral icterus. Extraocular muscles intact.  HEENT: Head atraumatic, normocephalic. Oropharynx and nasopharynx clear.  NECK:  Supple, no jugular venous distention. No thyroid enlargement, no tenderness.  LUNGS: Normal breath sounds bilaterally, no wheezing, rales,rhonchi or crepitation. No use of accessory muscles of respiration. Decreased bibasilar breath sounds. CARDIOVASCULAR: S1, S2 normal. No  rubs, or gallops. 3/6 systolic  murmur present ABDOMEN: Soft, nontender, nondistended. Bowel sounds present. No organomegaly or mass.  EXTREMITIES: No pedal edema, cyanosis, or clubbing.  NEUROLOGIC: Cranial nerves II through XII are intact. Muscle strength 5/5 in all extremities. Sensation intact. Gait not checked.  PSYCHIATRIC: The patient is alert and oriented 3. Generalized weakness noted. SKIN: No obvious rash, lesion, or ulcer.    LABORATORY PANEL:   CBC  Recent Labs Lab 08/22/15 1405  WBC 6.2  HGB 10.5*  HCT 32.1*  PLT 126*   ------------------------------------------------------------------------------------------------------------------  Chemistries   Recent Labs Lab 08/20/15 1829  08/26/15 0507  NA 134*  < > 138  K 2.5*  < > 3.1*  CL 91*  < > 104  CO2 31  < > 27  GLUCOSE 109*  < > 79  BUN 17  < > <5*  CREATININE 0.97  < > 0.45  CALCIUM 8.3*  < > 6.4*  AST 159*  --   --   ALT 19  --   --   ALKPHOS 121  --   --   BILITOT 1.0  --   --   < > = values in this interval not displayed. ------------------------------------------------------------------------------------------------------------------  Cardiac Enzymes  Recent Labs Lab 08/25/15 2143  TROPONINI 0.08*   ------------------------------------------------------------------------------------------------------------------  RADIOLOGY:  Ct Abdomen Pelvis W Contrast  08/24/2015  CLINICAL DATA:  Bacteremia due to Escherichia with mild LUQ and LLQ tenderness EXAM: CT ABDOMEN AND PELVIS WITH CONTRAST TECHNIQUE: Multidetector CT imaging of the abdomen and pelvis was performed using the standard protocol following bolus administration of intravenous contrast. CONTRAST:  19mL OMNIPAQUE IOHEXOL 300 MG/ML  SOLN COMPARISON:  None available FINDINGS: Small bilateral pleural effusions. Adjacent consolidation/ atelectasis posteriorly in the lung  bases. Moderate coronary calcifications. Mild cardiomegaly, incompletely visualized. Somewhat lobulated  3.3 cm fluid attenuation lesion in hepatic segment 8 with some thin internal septations, no adjacent parenchymal enhancement abnormalities . No other liver lesion. Gallbladder is nondilated. Unremarkable spleen. 1 cm enhancing right adrenal nodule. Lobular left adrenal gland. There is diffuse mild urothelial prominence in the central right renal collecting system and mild right ureterectasis in its proximal and mid segments, but no renal or ureteral calculus. Foley catheter partially decompresses the urinary bladder, which contains a small amount of gas presumably from instrumentation. Renal parenchyma unremarkable. Symmetric bilateral renal excretion on delayed scans. Stomach, small bowel, and colon are nondilated. Normal appendix containing contrast. No ascites. No free air. No adenopathy. Portal vein patent. Scattered aortoiliac calcified plaque without aneurysm or stenosis. Mild presacral soft tissue edema, poorly marginated. No evidence of abscess. IMPRESSION: 1. Urothelial prominence in the right renal collecting system and proximal ureter. Correlate with any clinical or laboratory evidence of UTI. 2. Small bilateral pleural effusions with adjacent consolidation/atelectasis in the lung bases. 3. Atherosclerosis, including aortoiliac and coronary artery disease. Please note that although the presence of coronary artery calcium documents the presence of coronary artery disease, the severity of this disease and any potential stenosis cannot be assessed on this non-gated CT examination. Assessment for potential risk factor modification, dietary therapy or pharmacologic therapy may be warranted, if clinically indicated. 4. 3.3 cm  septated cyst, hepatic segment 8. Electronically Signed   By: Lucrezia Europe M.D.   On: 08/24/2015 19:28    EKG:   Orders placed or performed during the hospital encounter of 08/20/15  . ED EKG  . ED EKG  . EKG 12-Lead  . EKG 12-Lead  . EKG 12-Lead  . EKG 12-Lead    ASSESSMENT  AND PLAN:   64 year old female with past medical history significant for coronary artery disease status post stents, hypertension, seizure disorder, ongoing smoking presents to the hospital secondary to fall. Noted to be septic and also has rhabdomyolysis.  #1 sepsis- likely source urine. -no urine cultures were sent on admission  -Blood cultures growing Escherichia coli , not ESBL. on rocephin for now - Due to continuing fevers- repeat cultures done on 08/23/2015 but they are negative. No further fevers now. Discontinue vancomycin -Since cultures are negative PICC line can stay in. -ID consult is appreciated. CT of the abdomen with no abscess or other infection and no pyelonephritis.-  - Monitor closely  #2 elevated troponins-likely demand ischemia. Troponins have plateaued. -No chest pain. Also from rhabdomyolysis. Improving CPK - echo with mild LVH and no wall motion abnormalities. Cardiology consulted. No further cardiac workup needed  #3 rhabdomyolysis-likely from fall. Patient not sure if she had a seizure prior to the fall or not. Has been having multiple falls lately. -Will need rehabilitation at discharge. Not safe to go back home by herself. -IV fluids. CK level much improved. -If stays more alert and takes fluids by mouth, then discontinue IV fluids  #4 seizure disorder-known history of seizure disorder -Follows with Dr. Celesta Aver at [facility] - Dilantin level elevated on admission and also free Dilantin level elevated. Patient might need a lower dose of Dilantin at discharge. Or other medications need to be added. No seizures here in the hospital. -She needs to be seen at an epilepsy center. -Appreciate neurology consult. -Continue her keppra and Neurontin. keppra level within normal limits   #5 Hypokalemia- replaced again. Hypocalcemia- replaced. IV and oral supplements added  #6 metabolic encephalopathy-likely  from her sepsis and also elevated Dilantin level. -Continues to  improve at this point. -CT of the head negative for any acute findings. -Continue to monitor.  #7 DVT prophylaxis-on subcutaneous Lovenox   Physical therapy consulted and they have recommended rehabilitation. Encourage ambulation and sitting in the chair today.   All the records are reviewed and case discussed with Care Management/Social Workerr. Management plans discussed with the patient, family and they are in agreement.  CODE STATUS: Full code  TOTAL TIME TAKING CARE OF THIS PATIENT: 44minutes.   POSSIBLE D/C IN 1-2 DAYS, DEPENDING ON CLINICAL CONDITION.   Gladstone Lighter M.D on 08/26/2015 at 12:20 PM  Between 7am to 6pm - Pager - [phone]  After 6pm go to [email or web address] - password [email or web address] [facility]  Office  [phone]  CC: Primary care physician; Pcp Not In System

## 2015-08-27 DIAGNOSIS — R410 Disorientation, unspecified: Secondary | ICD-10-CM

## 2015-08-27 DIAGNOSIS — F329 Major depressive disorder, single episode, unspecified: Secondary | ICD-10-CM

## 2015-08-27 DIAGNOSIS — F321 Major depressive disorder, single episode, moderate: Secondary | ICD-10-CM | POA: Insufficient documentation

## 2015-08-27 DIAGNOSIS — F32A Depression, unspecified: Secondary | ICD-10-CM

## 2015-08-27 LAB — CBC
HCT: 26.4 % — ABNORMAL LOW (ref 35.0–47.0)
HEMOGLOBIN: 8.6 g/dL — AB (ref 12.0–16.0)
MCH: 30.9 pg (ref 26.0–34.0)
MCHC: 32.7 g/dL (ref 32.0–36.0)
MCV: 94.5 fL (ref 80.0–100.0)
PLATELETS: 253 10*3/uL (ref 150–440)
RBC: 2.79 MIL/uL — AB (ref 3.80–5.20)
RDW: 14 % (ref 11.5–14.5)
WBC: 5.8 10*3/uL (ref 3.6–11.0)

## 2015-08-27 LAB — BASIC METABOLIC PANEL
Anion gap: 5 (ref 5–15)
BUN: <5 mg/dL — ABNORMAL LOW (ref 6–20)
CHLORIDE: 104 mmol/L (ref 101–111)
CO2: 30 mmol/L (ref 22–32)
CREATININE: 0.56 mg/dL (ref 0.44–1.00)
Calcium: 7.5 mg/dL — ABNORMAL LOW (ref 8.9–10.3)
GFR calc non Af Amer: >60 mL/min (ref >60)
GLUCOSE: 85 mg/dL (ref 65–99)
Potassium: 4 mmol/L (ref 3.5–5.1)
Sodium: 139 mmol/L (ref 135–145)

## 2015-08-27 MED ORDER — MIRTAZAPINE 15 MG PO TBDP
15.0000 mg | ORAL_TABLET | Freq: Every day | ORAL | Status: DC
  Administered 2015-08-27: 15 mg via ORAL
  Filled 2015-08-27: qty 1

## 2015-08-27 NOTE — Consult Note (Addendum)
[facility] Face-to-Face Psychiatry Consult   Reason for Consult:  Consult for this 64 year old woman with a history of intractable seizure disorder multiple other medical problems who is in the hospital because of complications resulting from a fall Referring Physician:  Tressia Miners Patient Identification: Anne Phillips MRN:  [id number] Principal Diagnosis: Major depression single moderate Diagnosis:   Patient Active Problem List   Diagnosis Date Noted  . Depression [F32.9] 08/27/2015  . Acute delirium [R41.0] 08/27/2015  . (HFpEF) heart failure with preserved ejection fraction ([place]) [I50.30]   . Disorientation [R41.0]   . Pyrexia [R50.9]   . Hypoxia [R09.02]   . Non-traumatic rhabdomyolysis [M62.82]   . Coronary artery disease involving native coronary artery of native heart without angina pectoris [I25.10]   . Sepsis ([place]) [A41.9] 08/20/2015  . NSTEMI (non-ST elevated myocardial infarction) ([place]) [I21.4] 08/20/2015  . Rhabdomyolysis [M62.82] 08/20/2015    Total Time spent with patient: 1 hour  Subjective:   Anne Phillips is a 64 y.o. female patient admitted with "I hurt".  HPI:  Information from the patient and the chart. Chart reviewed. Older notes reviewed. Patient interviewed. Case discussed with social work. This 64 year old woman has a history of intractable seizures and is in the hospital after suffering a fall at home with complications of rhabdomyolysis and possible infection. She was described as being encephalopathic and confused earlier in her hospital stay but that seems to be improving. Attempted to get an interview with the patient and she was only partially cooperative. Patient states that she is mainly concerned with her pain in her back and her legs and arms. She says she does not feel like she is "depressed". On the other hand she admits that she has nothing in her life that she enjoys and nothing to look forward to. She says she has passive thoughts of wishing she were  dead from time to time. No intention to try to kill her self. No psychotic symptoms. Does feel somewhat hopeless about her situation. She is not abusing drugs or alcohol. She is not currently on any medicine for depression and is not seeing anyone for outpatient mental health treatment. Staff noticed that the patient looks very depressed and sounds depressed and has not been fully cooperative with attempts to get her out of bed and moving suggesting a lack of motivation.  Social history: Patient lives by herself. Closest relations are her sisters. Patient is not able to work because of her seizures but at the same time has been turned down for disability and has little or no income and no kind of medical insurance support. No children of her own.  Medical history: States that she's had seizures since she was a child. She tells me she has 3 different kinds of seizures and that sometimes the ones that consist of jerking on her left side will happen more than 10 times a day. She says there have been occasions when it happened over 100 times a day. I noticed from Dr. Heinecke Caul note some confirmation of what I would suspect which is that that's a very unusual seizure pattern. She sees a neurologist at [facility] and says she is supposed to be on 4 different anticonvulsants. Patient is treated for sepsis and rhabdomyolysis here in the hospital as well. Just now recovering.  Substance abuse history: Denies alcohol or drug abuse or any past history of any alcohol or drug abuse.  Past Psychiatric History: Patient says she has never been in a psychiatric hospital, never seen  a psychiatrist or therapist in the past. Denies any history of suicide attempts or psychotic symptoms or antidepressant medicine use.  Risk to Self: Is patient at risk for suicide?: No Risk to Others:   Prior Inpatient Therapy:   Prior Outpatient Therapy:    Past Medical History:  Past Medical History  Diagnosis Date  . Seizures ([place])   .  Thyroid disease   . Coronary artery disease   . Hypertension   . Hyperlipidemia    History reviewed. No pertinent past surgical history. Family History:  Family History  Problem Relation Age of Onset  . Hypertension Other    Family Psychiatric  History: Patient says that she has family members who have alcohol dependence but denies any other mental health history in the family Social History:  History  Alcohol Use No     History  Drug Use Not on file    Social History   Social History  . Marital Status: Single    Spouse Name: N/A  . Number of Children: N/A  . Years of Education: N/A   Social History Main Topics  . Smoking status: Current Every Day Smoker -- 1.00 packs/day    Types: Cigarettes  . Smokeless tobacco: Never Used  . Alcohol Use: No  . Drug Use: None  . Sexual Activity: Not Asked   Other Topics Concern  . None   Social History Narrative   Additional Social History:                          Allergies:  No Known Allergies  Labs:  Results for orders placed or performed during the hospital encounter of 08/20/15 (from the past 48 hour(s))  Troponin I     Status: Abnormal   Collection Time: 08/25/15  9:43 PM  Result Value Ref Range   Troponin I 0.08 (H) <0.031 ng/mL    Comment: READ BACK AND VERIFIED WITH JASMINE LAMB AT 2230 ON 08/25/15 RWW        PERSISTENTLY INCREASED TROPONIN VALUES IN THE RANGE OF 0.04-0.49 ng/mL CAN BE SEEN IN:       -UNSTABLE ANGINA       -CONGESTIVE HEART FAILURE       -MYOCARDITIS       -CHEST TRAUMA       -ARRYHTHMIAS       -LATE PRESENTING MYOCARDIAL INFARCTION       -COPD   CLINICAL FOLLOW-UP RECOMMENDED.   Basic metabolic panel     Status: Abnormal   Collection Time: 08/26/15  5:07 AM  Result Value Ref Range   Sodium 138 135 - 145 mmol/L   Potassium 3.1 (L) 3.5 - 5.1 mmol/L   Chloride 104 101 - 111 mmol/L   CO2 27 22 - 32 mmol/L   Glucose, Bld 79 65 - 99 mg/dL   BUN <5 (L) 6 - 20 mg/dL   Creatinine,  Ser 0.45 0.44 - 1.00 mg/dL   Calcium 6.4 (LL) 8.9 - 10.3 mg/dL    Comment: CRITICAL RESULT CALLED TO, READ BACK BY AND VERIFIED WITH JASMINE LAMB AT 0547 ON 08/26/15 RWW    GFR calc non Af Amer >60 >60 mL/min   GFR calc Af Amer >60 >60 mL/min    Comment: (NOTE) The eGFR has been calculated using the CKD EPI equation. This calculation has not been validated in all clinical situations. eGFR's persistently <60 mL/min signify possible Chronic Kidney Disease.    Anion gap 7  5 - 15  Albumin     Status: Abnormal   Collection Time: 08/26/15  5:07 AM  Result Value Ref Range   Albumin 2.2 (L) 3.5 - 5.0 g/dL  CK     Status: Abnormal   Collection Time: 08/26/15  5:07 AM  Result Value Ref Range   Total CK 805 (H) 38 - 234 U/L  Urinalysis complete, with microscopic ([facility] only)     Status: Abnormal   Collection Time: 08/26/15  4:02 PM  Result Value Ref Range   Color, Urine YELLOW (A) YELLOW   APPearance CLEAR (A) CLEAR   Glucose, UA NEGATIVE NEGATIVE mg/dL   Bilirubin Urine NEGATIVE NEGATIVE   Ketones, ur NEGATIVE NEGATIVE mg/dL   Specific Gravity, Urine 1.009 1.005 - 1.030   Hgb urine dipstick 2+ (A) NEGATIVE   pH 7.0 5.0 - 8.0   Protein, ur NEGATIVE NEGATIVE mg/dL   Nitrite NEGATIVE NEGATIVE   Leukocytes, UA 2+ (A) NEGATIVE   RBC / HPF 6-30 0 - 5 RBC/hpf   WBC, UA 6-30 0 - 5 WBC/hpf   Bacteria, UA RARE (A) NONE SEEN   Squamous Epithelial / LPF 0-5 (A) NONE SEEN  Urine culture     Status: None (Preliminary result)   Collection Time: 08/26/15  4:02 PM  Result Value Ref Range   Specimen Description URINE, RANDOM    Special Requests Ceftriaxone    Culture TOO YOUNG TO READ    Report Status PENDING   Basic metabolic panel     Status: Abnormal   Collection Time: 08/27/15  7:03 AM  Result Value Ref Range   Sodium 139 135 - 145 mmol/L   Potassium 4.0 3.5 - 5.1 mmol/L   Chloride 104 101 - 111 mmol/L   CO2 30 22 - 32 mmol/L   Glucose, Bld 85 65 - 99 mg/dL   BUN <5 (L) 6 - 20 mg/dL    Creatinine, Ser 0.56 0.44 - 1.00 mg/dL   Calcium 7.5 (L) 8.9 - 10.3 mg/dL   GFR calc non Af Amer >60 >60 mL/min   GFR calc Af Amer >60 >60 mL/min    Comment: (NOTE) The eGFR has been calculated using the CKD EPI equation. This calculation has not been validated in all clinical situations. eGFR's persistently <60 mL/min signify possible Chronic Kidney Disease.    Anion gap 5 5 - 15  CBC     Status: Abnormal   Collection Time: 08/27/15  7:03 AM  Result Value Ref Range   WBC 5.8 3.6 - 11.0 K/uL   RBC 2.79 (L) 3.80 - 5.20 MIL/uL   Hemoglobin 8.6 (L) 12.0 - 16.0 g/dL   HCT 26.4 (L) 35.0 - 47.0 %   MCV 94.5 80.0 - 100.0 fL   MCH 30.9 26.0 - 34.0 pg   MCHC 32.7 32.0 - 36.0 g/dL   RDW 14.0 11.5 - 14.5 %   Platelets 253 150 - 440 K/uL    Current Facility-Administered Medications  Medication Dose Route Frequency Provider Last Rate Last Dose  . 0.9 %  sodium chloride infusion   Intravenous Continuous Gladstone Lighter, MD 60 mL/hr at 08/27/15 1422    . acetaminophen (TYLENOL) tablet 650 mg  650 mg Oral Q6H PRN Lytle Butte, MD   650 mg at 08/26/15 1954   Or  . acetaminophen (TYLENOL) suppository 650 mg  650 mg Rectal Q6H PRN Lytle Butte, MD      . aspirin EC tablet 81 mg  81 mg  Oral Daily Lytle Butte, MD   81 mg at 08/27/15 1036  . calcium carbonate (TUMS - dosed in mg elemental calcium) chewable tablet 200 mg of elemental calcium  1 tablet Oral Daily Gladstone Lighter, MD   200 mg of elemental calcium at 08/27/15 1036  . cefTRIAXone (ROCEPHIN) 2 g in dextrose 5 % 50 mL IVPB  2 g Intravenous Q24H Sheema M Hallaji, RPH   2 g at 08/27/15 1424  . clonazePAM (KLONOPIN) tablet 1 mg  1 mg Oral BID Gladstone Lighter, MD   1 mg at 08/27/15 1036  . enoxaparin (LOVENOX) injection 40 mg  40 mg Subcutaneous Q24H Gladstone Lighter, MD   40 mg at 08/27/15 1424  . gabapentin (NEURONTIN) tablet 300 mg  300 mg Oral TID Gladstone Lighter, MD   300 mg at 08/27/15 1036  . hydrALAZINE (APRESOLINE)  injection 10 mg  10 mg Intravenous Q4H PRN Lytle Butte, MD      . iohexol (OMNIPAQUE) 240 MG/ML injection 50 mL  50 mL Oral Once PRN Adrian Prows, MD      . ipratropium-albuterol (DUONEB) 0.5-2.5 (3) MG/3ML nebulizer solution 3 mL  3 mL Nebulization Q4H PRN Lytle Butte, MD      . levETIRAcetam (KEPPRA) tablet 500 mg  500 mg Oral q morning - 10a Lytle Butte, MD   500 mg at 08/27/15 1037   And  . levETIRAcetam (KEPPRA) tablet 1,000 mg  1,000 mg Oral QHS Lytle Butte, MD   1,000 mg at 08/26/15 1954  . mirtazapine (REMERON SOL-TAB) disintegrating tablet 15 mg  15 mg Oral QHS Gonzella Lex, MD      . morphine 2 MG/ML injection 2 mg  2 mg Intravenous Q4H PRN Lytle Butte, MD   2 mg at 08/27/15 [phone]  . nitroGLYCERIN (NITROGLYN) 2 % ointment 0.5 inch  0.5 inch Topical 4 times per day Harrie Foreman, MD   0.5 inch at 08/27/15 1424  . nitroGLYCERIN (NITROSTAT) SL tablet 0.4 mg  0.4 mg Sublingual Q5 min PRN Lytle Butte, MD      . ondansetron [facility]) tablet 4 mg  4 mg Oral Q6H PRN Lytle Butte, MD       Or  . ondansetron [facility]) injection 4 mg  4 mg Intravenous Q6H PRN Lytle Butte, MD      . oxyCODONE (Oxy IR/ROXICODONE) immediate release tablet 5 mg  5 mg Oral Q4H PRN Lytle Butte, MD   5 mg at 08/25/15 1602  . sodium chloride 0.9 % injection 3 mL  3 mL Intravenous Q12H Lytle Butte, MD   3 mL at 08/25/15 1056    Musculoskeletal: Strength & Muscle Tone: decreased Gait & Station: unable to stand Patient leans: N/A  Psychiatric Specialty Exam: Review of Systems  Constitutional: Positive for chills and malaise/fatigue.  HENT: Negative.   Eyes: Negative.   Respiratory: Negative.   Cardiovascular: Negative.   Gastrointestinal: Negative.   Musculoskeletal: Positive for back pain, joint pain and falls.  Skin: Negative.   Neurological: Positive for seizures and weakness.  Psychiatric/Behavioral: Positive for suicidal ideas and memory loss. Negative for depression,  hallucinations and substance abuse. The patient is nervous/anxious and has insomnia.     Blood pressure 150/77, pulse 81, temperature 98.7 F (37.1 C), temperature source Oral, resp. rate 18, height 5' 2" (1.575 m), weight 65.726 kg (144 lb 14.4 oz), SpO2 98 %.Body mass index is 26.5 kg/(m^2).  General Appearance:  Disheveled  Eye Contact::  Poor  Speech:  Slow  Volume:  Decreased  Mood:  Dysphoric  Affect:  Depressed  Thought Process:  Circumstantial  Orientation:  Full (Time, Place, and Person)  Thought Content:  Negative  Suicidal Thoughts:  Yes.  without intent/plan  Homicidal Thoughts:  No  Memory:  Immediate;   Fair Recent;   Poor Remote;   Poor  Judgement:  Impaired  Insight:  Shallow  Psychomotor Activity:  Decreased  Concentration:  Poor  Recall:  Poor  Fund of Knowledge:Fair  Language: Fair  Akathisia:  No  Handed:  Right  AIMS (if indicated):     Assets:  Housing Resilience  ADL's:  Impaired  Cognition: Impaired,  Mild  Sleep:      Treatment Plan Summary: Daily contact with patient to assess and evaluate symptoms and progress in treatment, Medication management and Plan 64 year old woman with seizure disorder multiple medical problems multiple major social problems. Just looking at her I can clearly see why the treatment team was concerned. She looks like she is feeling miserable and is very sad and down. Her speech is very slow. Everything about her looks unhappy. Patient says she is only concerned about her physical pain but at the same time admits to several symptoms that would be consistent with depression. Collateral information is that ever since she lost her job a few years ago she has been completely withdrawn and inactive at home. Despite the patient's statement that she does not feel depressed I think it would be appropriate to try treating for depression. I am going to start her on mirtazapine 15 mg a night as a starting dose for depression. Patient will need  continued monitoring and follow-up after discharge from the hospital. Supportive counseling done with the patient.  Disposition: Patient does not meet criteria for psychiatric inpatient admission. Supportive therapy provided about ongoing stressors. Discussed crisis plan, support from social network, calling 911, coming to the Emergency Department, and calling Suicide Hotline.    08/27/2015 2:31 PM

## 2015-08-27 NOTE — BH Assessment (Signed)
Received call about consult. Writer forwarded the information to On Call Psychiatrist (Dr. Weber Cooks).

## 2015-08-27 NOTE — Progress Notes (Signed)
Clinical Education officer, museum (CSW) met with patient, her sister [name] and MD were at bedside. CSW explained that [facility] facility in [place] could not make a bed offer because it was unclear if patient would qualify for Medicaid. CSW explained that no facilities have offered because of this reason. PT worked with patient today however PT note states "Patient displayed minimal motivation to work with therapy at this time." Patient reported that she was in pain which limited her from working with PT. MD examined patient in the room and patient could bend her knee. CSW explained that patient will have to go home because a facility is not an option. Patient is agreeable to returning home. Per sister patient lives in a 1 story house and has a few stairs to get up on the outside. Per sister patient lost her job 3 years ago and mostly lays on her couch. Sister reported that she cannot come stay with her because she is staying with her other sister. Per sister she can provide meals for patient but not 24/7 care. Per MD patient will D/C home tomorrow 08/28/15. CSW discussed case with social work Social worker. Per [facility] RN Case Manager can set up home health nusring under "indengent patient". CSW made RN Case Manager aware of above. Please reconsult if future social work needs arise. CSW signing off.   Blima Rich, [place] [phone]

## 2015-08-27 NOTE — Progress Notes (Signed)
[place] [facility] [place] NAME: Anne Phillips    MR#:  [id number]  DATE OF BIRTH:  05-02-1951  SUBJECTIVE:  CHIEF COMPLAINT:   Chief Complaint  Patient presents with  . Altered Mental Status   - Patient more alert and at her baseline. Sister at bedside. -Very less motivated to work with physical therapy or eating. -Complaining of left knee pain from the fall. But no limitation to motion.  REVIEW OF SYSTEMS:  Review of Systems  Constitutional: Positive for malaise/fatigue. Negative for fever and chills.  HENT: Negative for ear discharge, ear pain and tinnitus.   Eyes: Negative for blurred vision.  Respiratory: Negative for cough, shortness of breath and wheezing.   Cardiovascular: Negative for chest pain, palpitations and leg swelling.  Gastrointestinal: Negative for nausea, vomiting, abdominal pain, diarrhea and constipation.  Genitourinary: Negative for dysuria.  Musculoskeletal: Positive for myalgias and joint pain.       Left knee pain  Neurological: Negative for dizziness, sensory change, speech change, seizures and headaches.  Psychiatric/Behavioral: Negative for depression.       Confusion present    DRUG ALLERGIES:  No Known Allergies  VITALS:  Blood pressure 150/77, pulse 81, temperature 98.7 F (37.1 C), temperature source Oral, resp. rate 18, height 5\' 2"  (1.575 m), weight 65.726 kg (144 lb 14.4 oz), SpO2 98 %.  PHYSICAL EXAMINATION:  Physical Exam  GENERAL:  64 y.o.-year-old patient lying in the bed with no acute distress.  EYES: Pupils equal, round, reactive to light and accommodation. No scleral icterus. Extraocular muscles intact.  HEENT: Head atraumatic, normocephalic. Oropharynx and nasopharynx clear.  NECK:  Supple, no jugular venous distention. No thyroid enlargement, no tenderness.  LUNGS: Normal breath sounds bilaterally, no wheezing, rales,rhonchi or crepitation. No use of accessory muscles of  respiration. Decreased bibasilar breath sounds. CARDIOVASCULAR: S1, S2 normal. No  rubs, or gallops. 3/6 systolic murmur present ABDOMEN: Soft, nontender, nondistended. Bowel sounds present. No organomegaly or mass.  EXTREMITIES: No pedal edema, cyanosis, or clubbing.  NEUROLOGIC: Cranial nerves II through XII are intact. Muscle strength 5/5 in all extremities. Sensation intact. Gait not checked.  PSYCHIATRIC: The patient is alert and oriented 3. Generalized weakness noted. Mask face. Possible depression. No suicidal ideation. SKIN: No obvious rash, lesion, or ulcer.    LABORATORY PANEL:   CBC  Recent Labs Lab 08/27/15 0703  WBC 5.8  HGB 8.6*  HCT 26.4*  PLT 253   ------------------------------------------------------------------------------------------------------------------  Chemistries   Recent Labs Lab 08/20/15 1829  08/27/15 0703  NA 134*  < > 139  K 2.5*  < > 4.0  CL 91*  < > 104  CO2 31  < > 30  GLUCOSE 109*  < > 85  BUN 17  < > <5*  CREATININE 0.97  < > 0.56  CALCIUM 8.3*  < > 7.5*  AST 159*  --   --   ALT 19  --   --   ALKPHOS 121  --   --   BILITOT 1.0  --   --   < > = values in this interval not displayed. ------------------------------------------------------------------------------------------------------------------  Cardiac Enzymes  Recent Labs Lab 08/25/15 2143  TROPONINI 0.08*   ------------------------------------------------------------------------------------------------------------------  RADIOLOGY:  No results found.  EKG:   Orders placed or performed during the hospital encounter of 08/20/15  . ED EKG  . ED EKG  . EKG 12-Lead  . EKG 12-Lead  . EKG 12-Lead  . EKG 12-Lead  ASSESSMENT AND PLAN:   64 year old female with past medical history significant for coronary artery disease status post stents, hypertension, seizure disorder, ongoing smoking presents to the hospital secondary to fall. Noted to be septic and also has  rhabdomyolysis.  #1 sepsis- likely source urine. -no urine cultures were sent on admission  -Blood cultures growing Escherichia coli , not ESBL. on rocephin for now - Due to continuing fevers- repeat cultures done on 08/23/2015 but they are negative. No further fevers now. -Discontinue PICC line at discharge. -ID consult is appreciated. CT of the abdomen with no abscess or other infection and no pyelonephritis.-  - Monitor closely -Surgeon on Levaquin for total 2 weeks of antibiotics  #2 elevated troponins-likely demand ischemia. Troponins have plateaued. -No chest pain. Also from rhabdomyolysis. Improving CPK - echo with mild LVH and no wall motion abnormalities. Cardiology consulted. No further cardiac workup needed  #3 rhabdomyolysis-likely from fall. Patient not sure if she had a seizure prior to the fall or not. Has been having multiple falls lately. -Patient is more alert now. Physical therapy reevaluation today. Patient is depressed and not wanting to participate with therapy. -Likely will be discharged home tomorrow -IV fluids. CK level much improved. -Discontinue IV fluids today  #4 seizure disorder-known history of seizure disorder -Follows with Dr. Celesta Aver at [facility] - Dilantin level elevated on admission and also free Dilantin level elevated. Patient might need a lower dose of Dilantin at discharge. Or other medications need to be added. No seizures here in the hospital. -She needs to be seen at an epilepsy center. -Appreciate neurology consult. -Continue her keppra and Neurontin. keppra level within normal limits  #5 Hypokalemia- replaced and normalized. Hypocalcemia- replaced. Improved  #6 metabolic encephalopathy-likely from her sepsis and also elevated Dilantin level. -Continues to improve at this point. -CT of the head negative for any acute findings. -Continue to monitor.  #7 DVT prophylaxis-on subcutaneous Lovenox  #8 depression-no suicidal ideation. Appreciate  psych consult. -Started on Remeron at bedtime. Outpatient follow-up recommended   Physical therapy consulted and they have recommended rehabilitation. Encourage ambulation  Patient not motivated to work with physical therapy. Rehabilitation will not be covered. She will have to go home with home health. Sister will help her.   All the records are reviewed and case discussed with Care Management/Social Workerr. Management plans discussed with the patient, family and they are in agreement.  CODE STATUS: Full code  TOTAL TIME TAKING CARE OF THIS PATIENT: 68minutes.   POSSIBLE D/C TOMORROW, DEPENDING ON CLINICAL CONDITION.   Gladstone Lighter M.D on 08/27/2015 at 2:47 PM  Between 7am to 6pm - Pager - [phone]  After 6pm go to [email or web address] - password [email or web address]  Office  [phone]  CC: Primary care physician; Pcp Not In System

## 2015-08-27 NOTE — Progress Notes (Signed)
Physical Therapy Treatment Patient Details Name: Anne Phillips MRN: [id number] DOB: [birth year] Today's Date: 08/27/2015    History of Present Illness Anne Phillips is a 64 y.o. female with a known history of seizure disorder, coronary artery disease status post stent placement, medical noncompliance is presenting with altered mental status.   jPt admitted with h/o seizure, sepisis likely source urine, nstemi from demand ischemia, rhabodo s/p fall.  CT head negative for acute findings.  Patient continues to present as lethargic and complains of global generalized pain and achiness. Patient educated extensively on the need to ambulate in this session, however she provides poor effort and ultimately ends session abruptly due to "foot pain" bilaterally. PT offered additional ambulation and educated patients on benefits, however she continued to decline. Patient displayed minimal motivation to work with therapy at this time, however she is unable to safely ambulate even minimal distances currently. Acute PT services will continue to follow patient and attempt to progress as tolerated.   PT Comments      Follow Up Recommendations  SNF     Equipment Recommendations  Rolling walker with 5" wheels    Recommendations for Other Services       Precautions / Restrictions Precautions Precautions: Fall Restrictions Weight Bearing Restrictions: No    Mobility  Bed Mobility         Supine to sit: Modified independent (Device/Increase time) Sit to supine: Modified independent (Device/Increase time)   General bed mobility comments: Patient is able to bring herself on and off the edge of the bed without external assistance. Prolonged time to complete due to lethargy.   Transfers Overall transfer level: Needs assistance Equipment used: Rolling walker (2 wheeled) Transfers: Sit to/from Stand Sit to Stand: Min guard         General transfer comment: Patient does not demonstrate any  loss of balance initially with standing, no physical assistance required however prolonged time to complete.   Ambulation/Gait Ambulation/Gait assistance: Min guard Ambulation Distance (Feet): 5 Feet Assistive device: Rolling walker (2 wheeled) Gait Pattern/deviations: Step-to pattern;Decreased step length - right;Decreased step length - left Gait velocity: decreased   General Gait Details: Patient takes several very minimal steps then begins to complain of bilateral plantar foot pain and states she will be sitting down. Patient counseled multiple times that she must ambulate in order to discharge, patient continues to refuse all mobility after this initial bout.    Stairs            Wheelchair Mobility    Modified Rankin (Stroke Patients Only)       Balance Overall balance assessment: Needs assistance Sitting-balance support: Bilateral upper extremity supported;Feet supported Sitting balance-Leahy Scale: Fair Sitting balance - Comments: Patient is lethargic but does not seem to lean posteriorly or have other notable balance deficits.    Standing balance support: Bilateral upper extremity supported Standing balance-Leahy Scale: Fair Standing balance comment: Minimal step lengths and speed noted. She ambulates with her feet outside the RW and requires cuing.                     Cognition Arousal/Alertness: Lethargic Behavior During Therapy: Flat affect Overall Cognitive Status: Within Functional Limits for tasks assessed                      Exercises      General Comments        Pertinent Vitals/Pain Pain Assessment:  (Patient reports she has pain all  over, particularly in the bottom of her feet. She offered no other comments.) Pain Intervention(s): Limited activity within patient's tolerance;Monitored during session    Home Living                      Prior Function            PT Goals (current goals can now be found in the care  plan section) Acute Rehab PT Goals Patient Stated Goal: none stated Progress towards PT goals: Not progressing toward goals - comment (Patient with very poor effort today despite counseling from PT. )    Frequency  Min 2X/week    PT Plan Current plan remains appropriate    Co-evaluation             End of Session Equipment Utilized During Treatment: Gait belt Activity Tolerance: Patient limited by lethargy;Patient limited by fatigue Patient left: in bed;with bed alarm set;with call bell/phone within reach     Time: 1005-1019 PT Time Calculation (min) (ACUTE ONLY): 14 min  Charges:  $Gait Training: 8-22 mins                    G Codes:      Kerman Passey, PT, DPT    08/27/2015, 11:00 AM

## 2015-08-27 NOTE — Care Management Note (Signed)
Case Management Note  Patient Details  Name: Anne Phillips MRN: [id number] Date of Birth: 1951-05-17  Subjective/Objective:    Attempted to provide Ms Gebert with a list of local clinics that provide medical care and medication assistance to uninsured Mrs Sacchetti. Mrs Liebert was very groggy and was unable to repeat this information back. Left clinic list and application to the [facility] in Ms Mccammon's room to review again with her later.                 Action/Plan:   Expected Discharge Date:                  Expected Discharge Plan:     In-House Referral:     Discharge planning Services     Post Acute Care Choice:    Choice offered to:     DME Arranged:    DME Agency:     HH Arranged:    [place] Agency:     Status of Service:     Medicare Important Message Given:    Date Medicare IM Given:    Medicare IM give by:    Date Additional Medicare IM Given:    Additional Medicare Important Message give by:     If discussed at [place] of Stay Meetings, dates discussed:    Additional Comments:  Cleatus Gabriel A, RN 08/27/2015, 3:30 PM

## 2015-08-28 MED ORDER — OXYCODONE HCL 5 MG PO TABS: 5.0000 mg | ORAL_TABLET | ORAL | Status: DC | PRN

## 2015-08-28 MED ORDER — LEVOFLOXACIN 500 MG PO TABS
500.0000 mg | ORAL_TABLET | ORAL | Status: AC
  Administered 2015-08-28: 500 mg via ORAL
  Filled 2015-08-28: qty 1

## 2015-08-28 MED ORDER — LEVOFLOXACIN 500 MG PO TABS: 500.0000 mg | ORAL_TABLET | Freq: Every day | ORAL | Status: AC

## 2015-08-28 MED ORDER — HYDRALAZINE HCL 25 MG PO TABS
25.0000 mg | ORAL_TABLET | ORAL | Status: AC
  Administered 2015-08-28: 25 mg via ORAL
  Filled 2015-08-28: qty 1

## 2015-08-28 MED ORDER — GABAPENTIN 300 MG PO CAPS: 300.0000 mg | ORAL_CAPSULE | Freq: Three times a day (TID) | ORAL | Status: DC

## 2015-08-28 MED ORDER — PHENYTOIN SODIUM EXTENDED 100 MG PO CAPS: 200.0000 mg | ORAL_CAPSULE | Freq: Every day | ORAL | Status: DC

## 2015-08-28 MED ORDER — CLONAZEPAM 1 MG PO TABS: 1.0000 mg | ORAL_TABLET | Freq: Two times a day (BID) | ORAL | Status: DC

## 2015-08-28 MED ORDER — HYDRALAZINE HCL 10 MG PO TABS: 10.0000 mg | ORAL_TABLET | Freq: Three times a day (TID) | ORAL | Status: DC

## 2015-08-28 MED ORDER — MIRTAZAPINE 15 MG PO TBDP: 15.0000 mg | ORAL_TABLET | Freq: Every day | ORAL | Status: DC

## 2015-08-28 NOTE — Progress Notes (Signed)
Patient discharge teaching given, including activity, diet, follow-up appoints, and medications. Patient verbalized understanding of all discharge instructions. IV access was d/c'd. Vitals are stable. Skin is intact except as charted in most recent assessments. Pt to be escorted out by volunteer, to be driven home by family.  Callan Yontz E Hobbs  

## 2015-08-28 NOTE — Progress Notes (Signed)
Patient accepted for charity Care with [facility]. Floydene Flock to room to speak with patient.  Anticipate discharge today

## 2015-08-28 NOTE — Discharge Summary (Signed)
[facility] [place] NAME: Anne Phillips    MR#:  [id number]  DATE OF BIRTH:  05/23/1951  DATE OF ADMISSION:  08/20/2015 ADMITTING PHYSICIAN: Lytle Butte, MD  DATE OF DISCHARGE: 08/28/2015  PRIMARY CARE PHYSICIAN: Pcp Not In System    ADMISSION DIAGNOSIS:  Disorientation [R41.0] Hypoxia [R09.02] SIRS (systemic inflammatory response syndrome) (HCC) [R65.10] Non-traumatic rhabdomyolysis [M62.82] Fever, unspecified fever cause [R50.9]  DISCHARGE DIAGNOSIS:  Active Problems:   Sepsis ([place])   NSTEMI (non-ST elevated myocardial infarction) ([place])   Rhabdomyolysis   (HFpEF) heart failure with preserved ejection fraction (HCC)   Disorientation   Pyrexia   Hypoxia   Non-traumatic rhabdomyolysis   Coronary artery disease involving native coronary artery of native heart without angina pectoris   Depression   Acute delirium   Depression, major, single episode, moderate ([place])   SECONDARY DIAGNOSIS:   Past Medical History  Diagnosis Date  . Seizures ([place])   . Thyroid disease   . Coronary artery disease   . Hypertension   . Hyperlipidemia     HOSPITAL COURSE:   64 year old female with past medical history significant for coronary artery disease status post stents, hypertension, seizure disorder, ongoing smoking presents to the hospital secondary to fall. Noted to be septic and also has rhabdomyolysis.  #1 sepsis- likely source urine. -no urine cultures were sent on admission  -Blood cultures growing Escherichia coli , not ESBL. on rocephin for now- change to levaquin for a total of 14days - repeat cultures done on 08/23/2015  are negative. No further fevers now. -Discontinue PICC line at discharge. -ID consult is appreciated. CT of the abdomen with no abscess or other infection and no pyelonephritis.-  - Monitor closely  #2 elevated troponins-likely demand ischemia. Troponins have plateaued. -No chest pain. Also from  rhabdomyolysis. Improving CPK - echo with mild LVH and no wall motion abnormalities. Cardiology consulted. No further cardiac workup needed  #3 rhabdomyolysis-likely from fall.  -CPK levels improved with fluids  #4 seizure disorder-known history of seizure disorder -Follows with Dr. Celesta Aver at [facility] - Dilantin level elevated on admission and also free Dilantin level elevated. Patient might need a lower dose of Dilantin at discharge. No seizures here in the hospital. - being discharged on 200mg  at bedtime. Check a level in 1 week -She needs to be seen at an epilepsy center. -Appreciate neurology consult. -Continue her keppra and Neurontin. keppra level within normal limits  #5 Hypokalemia- replaced and normalized. Hypocalcemia- replaced. Improved  #6 metabolic encephalopathy-likely from her sepsis and also elevated Dilantin level. -Improved and alert and at baseline now. -CT of the head negative for any acute findings.  #7 depression-no suicidal ideation. Appreciate psych consult. -Started on Remeron at bedtime. Outpatient follow-up recommended  Patient not motivated to work with physical therapy. Has some left knee pain from the fall. But no limitation to movement. -Encourage participation with therapy. Family to help patient after discharge. -Discharge home today. Home health being arranged.   DISCHARGE CONDITIONS:   Guarded  CONSULTS OBTAINED:  Treatment Team:  Lytle Butte, MD Adrian Prows, MD Gonzella Lex, MD  DRUG ALLERGIES:  No Known Allergies  DISCHARGE MEDICATIONS:   Current Discharge Medication List    START taking these medications   Details  levofloxacin (LEVAQUIN) 500 MG tablet Take 1 tablet (500 mg total) by mouth daily. For 8 more days Qty: 8 tablet, Refills: 0    mirtazapine (REMERON SOL-TAB) 15 MG disintegrating  tablet Take 1 tablet (15 mg total) by mouth at bedtime. Qty: 30 tablet, Refills: 0    oxyCODONE (OXY IR/ROXICODONE) 5 MG immediate  release tablet Take 1 tablet (5 mg total) by mouth every 4 (four) hours as needed for moderate pain. Qty: 20 tablet, Refills: 0      CONTINUE these medications which have CHANGED   Details  clonazePAM (KLONOPIN) 1 MG tablet Take 1 tablet (1 mg total) by mouth 2 (two) times daily. Qty: 30 tablet, Refills: 0    gabapentin (NEURONTIN) 300 MG capsule Take 1 capsule (300 mg total) by mouth 3 (three) times daily. Qty: 90 capsule, Refills: 0    hydrALAZINE (APRESOLINE) 10 MG tablet Take 1 tablet (10 mg total) by mouth 3 (three) times daily. Qty: 90 tablet, Refills: 0    phenytoin (DILANTIN) 100 MG ER capsule Take 2 capsules (200 mg total) by mouth at bedtime. Qty: 60 capsule, Refills: 2      CONTINUE these medications which have NOT CHANGED   Details  hydrochlorothiazide (MICROZIDE) 12.5 MG capsule Take 12.5 mg by mouth daily.    levETIRAcetam (KEPPRA) 500 MG tablet Take 500-1,000 mg by mouth 2 (two) times daily. Take 1 tablet every morning and 2 tablets every evening.         DISCHARGE INSTRUCTIONS:   1. PCP f/u with 1-2 weeks 2. Outpatient psychiatry follow up in 2 weeks 3. Home health 4. Neurology f/u at [facility] in 2 weeks  If you experience worsening of your admission symptoms, develop shortness of breath, life threatening emergency, suicidal or homicidal thoughts you must seek medical attention immediately by calling 911 or calling your MD immediately  if symptoms less severe.  You Must read complete instructions/literature along with all the possible adverse reactions/side effects for all the Medicines you take and that have been prescribed to you. Take any new Medicines after you have completely understood and accept all the possible adverse reactions/side effects.   Please note  You were cared for by a hospitalist during your hospital stay. If you have any questions about your discharge medications or the care you received while you were in the hospital after you are  discharged, you can call the unit and asked to speak with the hospitalist on call if the hospitalist that took care of you is not available. Once you are discharged, your primary care physician will handle any further medical issues. Please note that NO REFILLS for any discharge medications will be authorized once you are discharged, as it is imperative that you return to your primary care physician (or establish a relationship with a primary care physician if you do not have one) for your aftercare needs so that they can reassess your need for medications and monitor your lab values.    Today   CHIEF COMPLAINT:   Chief Complaint  Patient presents with  . Altered Mental Status    VITAL SIGNS:  Blood pressure 175/80, pulse 91, temperature 98.8 F (37.1 C), temperature source Oral, resp. rate 20, height 5\' 2"  (1.575 m), weight 65.726 kg (144 lb 14.4 oz), SpO2 89 %.  I/O:   Intake/Output Summary (Last 24 hours) at 08/28/15 0850 Last data filed at 08/28/15 [id number]  Gross per 24 hour  Intake    600 ml  Output   1725 ml  Net  -1125 ml    PHYSICAL EXAMINATION:   Physical Exam  GENERAL: 64 y.o.-year-old patient lying in the bed with no acute distress.  EYES: Pupils  equal, round, reactive to light and accommodation. No scleral icterus. Extraocular muscles intact.  HEENT: Head atraumatic, normocephalic. Oropharynx and nasopharynx clear.  NECK: Supple, no jugular venous distention. No thyroid enlargement, no tenderness.  LUNGS: Normal breath sounds bilaterally, no wheezing, rales,rhonchi or crepitation. No use of accessory muscles of respiration. Decreased bibasilar breath sounds. CARDIOVASCULAR: S1, S2 normal. No rubs, or gallops. 3/6 systolic murmur present ABDOMEN: Soft, nontender, nondistended. Bowel sounds present. No organomegaly or mass.  EXTREMITIES: No pedal edema, cyanosis, or clubbing. Old bruising of the left knee without any swelling noted. NEUROLOGIC: Cranial nerves II  through XII are intact. Muscle strength 5/5 in all extremities. Sensation intact. Gait not checked.  PSYCHIATRIC: The patient is alert and oriented 3. Generalized weakness noted. Depressed mood. No suicidal ideation. More interactive today though. SKIN: No obvious rash, lesion, or ulcer.   DATA REVIEW:   CBC  Recent Labs Lab 08/27/15 0703  WBC 5.8  HGB 8.6*  HCT 26.4*  PLT 253    Chemistries   Recent Labs Lab 08/27/15 0703  NA 139  K 4.0  CL 104  CO2 30  GLUCOSE 85  BUN <5*  CREATININE 0.56  CALCIUM 7.5*    Cardiac Enzymes  Recent Labs Lab 08/25/15 2143  TROPONINI 0.08*    Microbiology Results  Results for orders placed or performed during the hospital encounter of 08/20/15  Culture, blood (Routine X 2) w Reflex to ID Panel     Status: None   Collection Time: 08/20/15  6:30 PM  Result Value Ref Range Status   Specimen Description BLOOD LEFT ASSIST CONTROL  Final   Special Requests BOTTLES DRAWN AEROBIC AND ANAEROBIC 1CC  Final   Culture NO GROWTH 5 DAYS  Final   Report Status 08/25/2015 FINAL  Final  Culture, blood (Routine X 2) w Reflex to ID Panel     Status: None   Collection Time: 08/20/15  6:47 PM  Result Value Ref Range Status   Specimen Description BLOOD RIGHT ASSIST CONTROL  Final   Special Requests   Final    BOTTLES DRAWN AEROBIC AND ANAEROBIC [place] AERO 5 CC ANA   Culture  Setup Time   Final    GRAM NEGATIVE RODS CRITICAL RESULT CALLED TO, READ BACK BY AND VERIFIED WITH: CHRISTINE KATSOUDAS AT [id number] ON 08/21/15 CTJ    Culture ESCHERICHIA COLI  Final   Report Status 08/23/2015 FINAL  Final   Organism ID, Bacteria ESCHERICHIA COLI  Final      Susceptibility   Escherichia coli - MIC*    AMPICILLIN >=32 RESISTANT Resistant     CEFTAZIDIME <=1 SENSITIVE Sensitive     CEFAZOLIN <=4 SENSITIVE Sensitive     CEFTRIAXONE <=1 SENSITIVE Sensitive     CIPROFLOXACIN <=0.25 SENSITIVE Sensitive     GENTAMICIN <=1 SENSITIVE Sensitive     IMIPENEM <=0.25  SENSITIVE Sensitive     TRIMETH/SULFA <=20 SENSITIVE Sensitive     PIP/TAZO Value in next row Sensitive      SENSITIVE<=4    * ESCHERICHIA COLI  Blood Culture ID Panel (Reflexed)     Status: Abnormal   Collection Time: 08/20/15  6:47 PM  Result Value Ref Range Status   Enterococcus species NOT DETECTED NOT DETECTED Final   Listeria monocytogenes NOT DETECTED NOT DETECTED Final   Staphylococcus species NOT DETECTED NOT DETECTED Final   Staphylococcus aureus NOT DETECTED NOT DETECTED Final   Streptococcus species NOT DETECTED NOT DETECTED Final   Streptococcus agalactiae NOT DETECTED  NOT DETECTED Final   Streptococcus pneumoniae NOT DETECTED NOT DETECTED Final   Streptococcus pyogenes NOT DETECTED NOT DETECTED Final   Acinetobacter baumannii NOT DETECTED NOT DETECTED Final   Enterobacteriaceae species NOT DETECTED NOT DETECTED Final   Enterobacter cloacae complex NOT DETECTED NOT DETECTED Final   Escherichia coli DETECTED (A) NOT DETECTED Final    Comment: CRITICAL RESULT CALLED TO AND VERIFIED BY: CHRISTINE KATSOUDAS AT [id number] ON 08/21/15 CTJ   Klebsiella oxytoca NOT DETECTED NOT DETECTED Final   Klebsiella pneumoniae NOT DETECTED NOT DETECTED Final   Proteus species NOT DETECTED NOT DETECTED Final   Serratia marcescens NOT DETECTED NOT DETECTED Final   Haemophilus influenzae NOT DETECTED NOT DETECTED Final   Neisseria meningitidis NOT DETECTED NOT DETECTED Final   Pseudomonas aeruginosa NOT DETECTED NOT DETECTED Final   Candida albicans NOT DETECTED NOT DETECTED Final   Candida glabrata NOT DETECTED NOT DETECTED Final   Candida krusei NOT DETECTED NOT DETECTED Final   Candida parapsilosis NOT DETECTED NOT DETECTED Final   Candida tropicalis NOT DETECTED NOT DETECTED Final   Carbapenem resistance NOT DETECTED NOT DETECTED Final   Methicillin resistance NOT DETECTED NOT DETECTED Final   Vancomycin resistance NOT DETECTED NOT DETECTED Final  C difficile quick scan w PCR reflex      Status: None   Collection Time: 08/23/15  2:00 AM  Result Value Ref Range Status   C Diff antigen NEGATIVE NEGATIVE Final   C Diff toxin NEGATIVE NEGATIVE Final   C Diff interpretation Negative for C. difficile  Final  CULTURE, BLOOD (ROUTINE X 2) w Reflex to PCR ID Panel     Status: None (Preliminary result)   Collection Time: 08/24/15  9:11 AM  Result Value Ref Range Status   Specimen Description BLOOD LEFT AC  Final   Special Requests BOTTLES DRAWN AEROBIC AND ANAEROBIC  3CC  Final   Culture NO GROWTH 3 DAYS  Final   Report Status PENDING  Incomplete  CULTURE, BLOOD (ROUTINE X 2) w Reflex to PCR ID Panel     Status: None (Preliminary result)   Collection Time: 08/24/15 10:32 AM  Result Value Ref Range Status   Specimen Description BLOOD PICC LINE  Final   Special Requests BOTTLES DRAWN AEROBIC AND ANAEROBIC  10CC  Final   Culture NO GROWTH 3 DAYS  Final   Report Status PENDING  Incomplete  Urine culture     Status: None (Preliminary result)   Collection Time: 08/26/15  4:02 PM  Result Value Ref Range Status   Specimen Description URINE, RANDOM  Final   Special Requests Ceftriaxone  Final   Culture TOO YOUNG TO READ  Final   Report Status PENDING  Incomplete    RADIOLOGY:  No results found.  EKG:   Orders placed or performed during the hospital encounter of 08/20/15  . ED EKG  . ED EKG  . EKG 12-Lead  . EKG 12-Lead  . EKG 12-Lead  . EKG 12-Lead      Management plans discussed with the patient, family and they are in agreement.  CODE STATUS:     Code Status Orders        Start     Ordered   08/20/15 2024  Full code   Continuous     08/20/15 2024      TOTAL TIME TAKING CARE OF THIS PATIENT: 37 minutes.    Gladstone Lighter M.D on 08/28/2015 at 8:50 AM  Between 7am to 6pm - Pager -  [phone]  After 6pm go to [email or web address] - password [email or web address] [facility]  Office  [phone]  CC: Primary care physician; Pcp Not In  System

## 2015-08-29 LAB — CULTURE, BLOOD (ROUTINE X 2)
CULTURE: NO GROWTH
Culture: NO GROWTH

## 2015-08-30 LAB — URINE CULTURE: Culture: 20000

## 2016-01-17 DIAGNOSIS — G40219 Localization-related (focal) (partial) symptomatic epilepsy and epileptic syndromes with complex partial seizures, intractable, without status epilepticus: Secondary | ICD-10-CM | POA: Insufficient documentation

## 2017-03-03 MED ORDER — AMLODIPINE 10 MG TABLET
ORAL_TABLET | Freq: Every day | ORAL | 3 refills | 0 days | Status: CP
Start: 2017-03-03 — End: 2018-05-06

## 2017-03-03 MED ORDER — LOSARTAN 100 MG-HYDROCHLOROTHIAZIDE 12.5 MG TABLET
ORAL_TABLET | Freq: Every day | ORAL | 1 refills | 0.00000 days | Status: CP
Start: 2017-03-03 — End: 2017-03-16

## 2017-03-03 MED ORDER — CARVEDILOL 12.5 MG TABLET
ORAL_TABLET | Freq: Two times a day (BID) | ORAL | 3 refills | 0.00000 days | Status: CP
Start: 2017-03-03 — End: 2017-05-12

## 2017-03-03 MED ORDER — TRIAMCINOLONE ACETONIDE 0.1 % TOPICAL OINTMENT
Freq: Two times a day (BID) | TOPICAL | 2 refills | 0 days | Status: CP
Start: 2017-03-03 — End: 2017-05-27

## 2017-03-03 MED ORDER — ATORVASTATIN 40 MG TABLET
ORAL_TABLET | Freq: Every day | ORAL | 3 refills | 0 days | Status: CP
Start: 2017-03-03 — End: 2018-02-01

## 2017-03-03 MED ORDER — HYDROCHLOROTHIAZIDE 12.5 MG CAPSULE
ORAL_CAPSULE | Freq: Every day | ORAL | 3 refills | 0.00000 days | Status: CP
Start: 2017-03-03 — End: 2017-03-16

## 2017-03-03 MED ORDER — PRASUGREL 10 MG TABLET
ORAL_TABLET | Freq: Every day | ORAL | 1 refills | 0 days | Status: CP
Start: 2017-03-03 — End: 2017-03-16

## 2017-03-16 ENCOUNTER — Ambulatory Visit
Admission: RE | Admit: 2017-03-16 | Discharge: 2017-03-16 | Disposition: A | Discharge status: Home / Self Care | Payer: MEDICARE | Admitting: Family Medicine

## 2017-03-16 DIAGNOSIS — G40119 Localization-related (focal) (partial) symptomatic epilepsy and epileptic syndromes with simple partial seizures, intractable, without status epilepticus: Secondary | ICD-10-CM

## 2017-03-16 DIAGNOSIS — R112 Nausea with vomiting, unspecified: Principal | ICD-10-CM

## 2017-03-16 DIAGNOSIS — I10 Essential (primary) hypertension: Secondary | ICD-10-CM

## 2017-03-16 DIAGNOSIS — E876 Hypokalemia: Secondary | ICD-10-CM

## 2017-03-16 DIAGNOSIS — E871 Hypo-osmolality and hyponatremia: Secondary | ICD-10-CM

## 2017-03-16 MED ORDER — CLOPIDOGREL 75 MG TABLET
ORAL_TABLET | Freq: Every day | ORAL | 3 refills | 0 days | Status: SS
Start: 2017-03-16 — End: 2018-03-19

## 2017-03-16 MED ORDER — LOSARTAN 100 MG TABLET
ORAL_TABLET | Freq: Every day | ORAL | 2 refills | 0.00000 days | Status: CP
Start: 2017-03-16 — End: 2017-07-13

## 2017-03-17 MED ORDER — POTASSIUM CHLORIDE ER 20 MEQ TABLET,EXTENDED RELEASE(PART/CRYST)
ORAL_TABLET | 0 refills | 0 days | Status: CP
Start: 2017-03-17 — End: 2017-04-17

## 2017-03-17 MED FILL — POTASSIUM CHLORIDE ER/20MEQ ER/TBCR: POTASSIUM CHLORIDE ER/20MEQ ER/TBCR | 14 days supply | Qty: 30 | Fill #0

## 2017-03-19 MED FILL — GABAPENTIN/300MG/CAPS: GABAPENTIN/300MG/CAPS | 90 days supply | Qty: 270 | Fill #0

## 2017-03-22 MED ORDER — MAGNESIUM OXIDE 400 MG (241.3 MG MAGNESIUM) TABLET
ORAL_TABLET | Freq: Every day | ORAL | 0 refills | 0 days
Start: 2017-03-22 — End: 2017-07-13

## 2017-03-25 MED FILL — CLONAZEPAM/1MG/TABS: CLONAZEPAM/1MG/TABS | 30 days supply | Qty: 60 | Fill #1

## 2017-04-09 MED ORDER — PHENYTOIN SODIUM EXTENDED 100 MG CAPSULE
ORAL_CAPSULE | Freq: Every evening | ORAL | 3 refills | 0 days | Status: CP
Start: 2017-04-09 — End: 2017-12-25

## 2017-04-09 MED ORDER — LEVETIRACETAM 500 MG TABLET
ORAL_TABLET | 3 refills | 0.00000 days | Status: SS
Start: 2017-04-09 — End: 2017-04-09

## 2017-04-09 MED ORDER — LEVETIRACETAM 500 MG TABLET: tablet | 3 refills | 0 days | Status: SS

## 2017-04-09 MED ORDER — GABAPENTIN 300 MG CAPSULE
ORAL_CAPSULE | Freq: Three times a day (TID) | ORAL | 2 refills | 0.00000 days | Status: CP
Start: 2017-04-09 — End: 2017-04-09

## 2017-04-09 MED ORDER — CLONAZEPAM 1 MG TABLET
ORAL_TABLET | Freq: Two times a day (BID) | ORAL | 5 refills | 0 days | Status: CP
Start: 2017-04-09 — End: 2017-06-30

## 2017-04-09 MED ORDER — DILANTIN EXTENDED 100 MG CAPSULE
ORAL_CAPSULE | 2 refills | 0 days
Start: 2017-04-09 — End: 2017-04-09

## 2017-04-09 MED ORDER — GABAPENTIN 300 MG CAPSULE: 300 mg | capsule | Freq: Three times a day (TID) | 3 refills | 0 days | Status: AC

## 2017-04-16 ENCOUNTER — Ambulatory Visit
Admission: RE | Admit: 2017-04-16 | Discharge: 2017-04-16 | Disposition: A | Discharge status: Home / Self Care | Payer: MEDICARE

## 2017-04-16 DIAGNOSIS — E871 Hypo-osmolality and hyponatremia: Principal | ICD-10-CM

## 2017-04-16 DIAGNOSIS — E876 Hypokalemia: Secondary | ICD-10-CM

## 2017-04-20 MED FILL — LEVETIRACETAM/500MG/TABS: LEVETIRACETAM/500MG/TABS | 90 days supply | Qty: 270 | Fill #0

## 2017-04-20 MED FILL — CLONAZEPAM/1MG/TAB: CLONAZEPAM/1MG/TAB | 30 days supply | Qty: 60 | Fill #0

## 2017-04-21 MED FILL — DILANTIN/100MG/CAP: DILANTIN/100MG/CAP | 90 days supply | Qty: 270 | Fill #0

## 2017-05-12 ENCOUNTER — Ambulatory Visit: Admission: RE | Admit: 2017-05-12 | Discharge: 2017-05-12 | Payer: MEDICARE | Admitting: Family Medicine

## 2017-05-12 DIAGNOSIS — E876 Hypokalemia: Secondary | ICD-10-CM

## 2017-05-12 DIAGNOSIS — R5383 Other fatigue: Secondary | ICD-10-CM

## 2017-05-12 DIAGNOSIS — E871 Hypo-osmolality and hyponatremia: Secondary | ICD-10-CM

## 2017-05-12 DIAGNOSIS — I10 Essential (primary) hypertension: Principal | ICD-10-CM

## 2017-05-12 MED ORDER — CARVEDILOL PHOSPHATE ER 20 MG CAPSULE,EXT.RELEASE24HR MULTIPHASE
ORAL_CAPSULE | Freq: Every day | ORAL | 3 refills | 0 days | Status: CP
Start: 2017-05-12 — End: 2017-07-13

## 2017-05-21 MED FILL — CLONAZEPAM/1MG/TAB: CLONAZEPAM/1MG/TAB | 30 days supply | Qty: 60 | Fill #1

## 2017-05-27 MED ORDER — TRIAMCINOLONE ACETONIDE 0.1 % TOPICAL OINTMENT
Freq: Two times a day (BID) | TOPICAL | 2 refills | 0.00000 days | Status: CP
Start: 2017-05-27 — End: 2017-09-23

## 2017-06-09 ENCOUNTER — Ambulatory Visit
Admission: RE | Admit: 2017-06-09 | Discharge: 2017-06-09 | Payer: MEDICARE | Attending: Nurse Practitioner | Admitting: Nurse Practitioner

## 2017-06-09 DIAGNOSIS — L309 Dermatitis, unspecified: Principal | ICD-10-CM

## 2017-06-10 ENCOUNTER — Ambulatory Visit
Admission: RE | Admit: 2017-06-10 | Discharge: 2017-06-10 | Payer: MEDICARE | Attending: Dermatology | Admitting: Dermatology

## 2017-06-10 DIAGNOSIS — R21 Rash and other nonspecific skin eruption: Principal | ICD-10-CM

## 2017-06-10 MED ORDER — BETAMETHASONE DIPROPIONATE 0.05 % TOPICAL OINTMENT
1 refills | 0 days | Status: CP
Start: 2017-06-10 — End: 2017-09-23

## 2017-06-10 MED FILL — BETAMETHASONE/0.05%/OIN: BETAMETHASONE/0.05%/OIN | 20 days supply | Qty: 1 | Fill #0

## 2017-06-24 ENCOUNTER — Ambulatory Visit
Admission: RE | Admit: 2017-06-24 | Discharge: 2017-06-24 | Payer: MEDICARE | Attending: Dermatology | Admitting: Dermatology

## 2017-06-24 DIAGNOSIS — L309 Dermatitis, unspecified: Principal | ICD-10-CM

## 2017-06-25 MED FILL — CLONAZEPAM/1MG/TAB: CLONAZEPAM/1MG/TAB | 30 days supply | Qty: 60 | Fill #2

## 2017-06-30 ENCOUNTER — Ambulatory Visit
Admission: RE | Admit: 2017-06-30 | Discharge: 2017-06-30 | Disposition: A | Discharge status: Home / Self Care | Admitting: Cardiovascular Disease

## 2017-06-30 DIAGNOSIS — E876 Hypokalemia: Secondary | ICD-10-CM

## 2017-06-30 DIAGNOSIS — G40119 Localization-related (focal) (partial) symptomatic epilepsy and epileptic syndromes with simple partial seizures, intractable, without status epilepticus: Principal | ICD-10-CM

## 2017-06-30 MED ORDER — CLONAZEPAM 1 MG TABLET
ORAL_TABLET | 0 refills | 0 days | Status: CP
Start: 2017-06-30 — End: 2017-09-02

## 2017-07-10 ENCOUNTER — Ambulatory Visit
Admission: RE | Admit: 2017-07-10 | Discharge: 2017-07-10 | Disposition: A | Discharge status: Home / Self Care | Payer: MEDICARE

## 2017-07-10 DIAGNOSIS — M810 Age-related osteoporosis without current pathological fracture: Secondary | ICD-10-CM

## 2017-07-10 DIAGNOSIS — E876 Hypokalemia: Principal | ICD-10-CM

## 2017-07-13 ENCOUNTER — Ambulatory Visit: Admission: RE | Admit: 2017-07-13 | Discharge: 2017-07-13 | Payer: MEDICARE | Admitting: Family Medicine

## 2017-07-13 DIAGNOSIS — R197 Diarrhea, unspecified: Secondary | ICD-10-CM

## 2017-07-13 DIAGNOSIS — I10 Essential (primary) hypertension: Secondary | ICD-10-CM

## 2017-07-13 DIAGNOSIS — E876 Hypokalemia: Principal | ICD-10-CM

## 2017-07-13 MED ORDER — LOSARTAN 100 MG TABLET
ORAL_TABLET | Freq: Every day | ORAL | 3 refills | 0 days | Status: CP
Start: 2017-07-13 — End: 2018-06-24

## 2017-07-13 MED ORDER — CARVEDILOL 25 MG TABLET
ORAL_TABLET | Freq: Two times a day (BID) | ORAL | 3 refills | 0 days | Status: CP
Start: 2017-07-13 — End: 2017-09-14

## 2017-07-28 MED FILL — DILANTIN/100MG/CAP: DILANTIN/100MG/CAP | 90 days supply | Qty: 270 | Fill #0

## 2017-07-28 MED FILL — CLONAZEPAM/1MG/TABS: CLONAZEPAM/1MG/TABS | 30 days supply | Qty: 60 | Fill #0

## 2017-09-04 MED ORDER — CLONAZEPAM 1 MG TABLET: tablet | 5 refills | 0 days | Status: AC

## 2017-09-04 MED ORDER — CLONAZEPAM 1 MG TABLET
ORAL_TABLET | ORAL | 5 refills | 0.00000 days | Status: CP
Start: 2017-09-04 — End: 2017-09-04

## 2017-09-04 MED FILL — CLONAZEPAM/1MG/TABS: CLONAZEPAM/1MG/TABS | 30 days supply | Qty: 60 | Fill #0

## 2017-09-14 ENCOUNTER — Ambulatory Visit: Admit: 2017-09-14 | Discharge: 2017-09-15 | Payer: MEDICARE | Attending: Family Medicine | Primary: Family Medicine

## 2017-09-14 DIAGNOSIS — I10 Essential (primary) hypertension: Principal | ICD-10-CM

## 2017-09-14 DIAGNOSIS — Z1159 Encounter for screening for other viral diseases: Secondary | ICD-10-CM

## 2017-09-14 DIAGNOSIS — F439 Reaction to severe stress, unspecified: Secondary | ICD-10-CM

## 2017-09-14 DIAGNOSIS — E876 Hypokalemia: Secondary | ICD-10-CM

## 2017-09-14 DIAGNOSIS — E559 Vitamin D deficiency, unspecified: Secondary | ICD-10-CM

## 2017-09-14 DIAGNOSIS — Z1239 Encounter for other screening for malignant neoplasm of breast: Secondary | ICD-10-CM

## 2017-09-17 ENCOUNTER — Ambulatory Visit: Admit: 2017-09-17 | Discharge: 2017-09-18 | Payer: MEDICARE

## 2017-09-17 DIAGNOSIS — E876 Hypokalemia: Secondary | ICD-10-CM

## 2017-09-17 DIAGNOSIS — I10 Essential (primary) hypertension: Secondary | ICD-10-CM

## 2017-09-17 DIAGNOSIS — Z1159 Encounter for screening for other viral diseases: Principal | ICD-10-CM

## 2017-09-17 MED ORDER — POTASSIUM CHLORIDE ER 10 MEQ TABLET,EXTENDED RELEASE
ORAL_TABLET | ORAL | 11 refills | 0 days | Status: SS
Start: 2017-09-17 — End: 2018-06-05

## 2017-09-17 MED ORDER — MAGNESIUM OXIDE 400 MG (241.3 MG MAGNESIUM) TABLET
ORAL_TABLET | Freq: Every day | ORAL | 0 refills | 0.00000 days | Status: SS
Start: 2017-09-17 — End: 2017-12-24

## 2017-09-17 MED ORDER — POTASSIUM CHLORIDE ER 10 MEQ TABLET,EXTENDED RELEASE(PART/CRYST)
ORAL_TABLET | Freq: Every day | ORAL | 11 refills | 0.00000 days | Status: CP
Start: 2017-09-17 — End: 2018-05-25

## 2017-09-22 MED FILL — GABAPENTIN/300MG/CAPS: GABAPENTIN/300MG/CAPS | 90 days supply | Qty: 270 | Fill #0

## 2017-09-22 MED FILL — LEVETIRACETAM/500MG/TABS: LEVETIRACETAM/500MG/TABS | 90 days supply | Qty: 270 | Fill #1

## 2017-09-23 ENCOUNTER — Ambulatory Visit: Admit: 2017-09-23 | Discharge: 2017-09-24 | Payer: MEDICARE | Attending: Dermatology | Primary: Dermatology

## 2017-09-23 DIAGNOSIS — L309 Dermatitis, unspecified: Principal | ICD-10-CM

## 2017-09-23 DIAGNOSIS — R21 Rash and other nonspecific skin eruption: Secondary | ICD-10-CM

## 2017-09-23 MED ORDER — BETAMETHASONE DIPROPIONATE 0.05 % TOPICAL OINTMENT
3 refills | 0 days | Status: CP
Start: 2017-09-23 — End: ?

## 2017-10-12 MED FILL — BETAMETHASONE/0.05%/OIN: BETAMETHASONE/0.05%/OIN | 20 days supply | Qty: 1 | Fill #1

## 2017-10-12 MED FILL — CLONAZEPAM/1MG/TABS: CLONAZEPAM/1MG/TABS | 30 days supply | Qty: 60 | Fill #1

## 2017-10-20 ENCOUNTER — Ambulatory Visit: Admit: 2017-10-20 | Discharge: 2017-10-21 | Payer: MEDICARE

## 2017-10-20 DIAGNOSIS — Z1239 Encounter for other screening for malignant neoplasm of breast: Principal | ICD-10-CM

## 2017-10-28 ENCOUNTER — Ambulatory Visit: Admit: 2017-10-28 | Discharge: 2017-10-29 | Payer: MEDICARE

## 2017-10-28 DIAGNOSIS — N63 Unspecified lump in unspecified breast: Secondary | ICD-10-CM

## 2017-10-28 DIAGNOSIS — N6320 Unspecified lump in the left breast, unspecified quadrant: Principal | ICD-10-CM

## 2017-10-30 ENCOUNTER — Ambulatory Visit: Admit: 2017-10-30 | Discharge: 2017-10-31 | Payer: MEDICARE | Attending: Adult Health | Primary: Adult Health

## 2017-10-30 DIAGNOSIS — N6320 Unspecified lump in the left breast, unspecified quadrant: Principal | ICD-10-CM

## 2017-11-03 ENCOUNTER — Ambulatory Visit: Admit: 2017-11-03 | Discharge: 2017-11-04 | Payer: MEDICARE

## 2017-11-03 DIAGNOSIS — R928 Other abnormal and inconclusive findings on diagnostic imaging of breast: Principal | ICD-10-CM

## 2017-11-06 MED FILL — CLONAZEPAM/1MG/TABS: CLONAZEPAM/1MG/TABS | 30 days supply | Qty: 60 | Fill #2

## 2017-11-18 ENCOUNTER — Ambulatory Visit: Admit: 2017-11-18 | Discharge: 2017-11-18 | Payer: MEDICARE | Attending: MS" | Primary: MS"

## 2017-11-18 ENCOUNTER — Ambulatory Visit
Admit: 2017-11-18 | Discharge: 2017-11-18 | Payer: MEDICARE | Attending: Radiation Oncology | Primary: Radiation Oncology

## 2017-11-18 ENCOUNTER — Ambulatory Visit: Admit: 2017-11-18 | Discharge: 2017-11-18 | Payer: MEDICARE | Attending: Surgical Oncology | Primary: Surgical Oncology

## 2017-11-18 ENCOUNTER — Ambulatory Visit
Admit: 2017-11-18 | Discharge: 2017-11-18 | Payer: MEDICARE | Attending: Hematology & Oncology | Primary: Hematology & Oncology

## 2017-11-18 DIAGNOSIS — C50912 Malignant neoplasm of unspecified site of left female breast: Principal | ICD-10-CM

## 2017-11-18 DIAGNOSIS — Z5181 Encounter for therapeutic drug level monitoring: Secondary | ICD-10-CM

## 2017-11-18 DIAGNOSIS — Z171 Estrogen receptor negative status [ER-]: Secondary | ICD-10-CM

## 2017-11-18 DIAGNOSIS — Z79899 Other long term (current) drug therapy: Secondary | ICD-10-CM

## 2017-11-18 DIAGNOSIS — C50412 Malignant neoplasm of upper-outer quadrant of left female breast: Secondary | ICD-10-CM

## 2017-11-18 MED ORDER — PROCHLORPERAZINE MALEATE 10 MG TABLET: tablet | 2 refills | 0 days

## 2017-11-18 MED ORDER — ONDANSETRON HCL 8 MG TABLET
ORAL_TABLET | Freq: Three times a day (TID) | ORAL | 2 refills | 0.00000 days | Status: CP | PRN
Start: 2017-11-18 — End: 2017-11-18

## 2017-11-18 MED ORDER — PROCHLORPERAZINE MALEATE 10 MG TABLET
ORAL_TABLET | Freq: Four times a day (QID) | ORAL | 2 refills | 0.00000 days | Status: CP | PRN
Start: 2017-11-18 — End: 2017-12-28

## 2017-11-18 MED ORDER — ONDANSETRON HCL 8 MG TABLET: tablet | 2 refills | 0 days

## 2017-11-18 MED FILL — PROCHLORPERAZINE/10MG/TABS: PROCHLORPERAZINE/10MG/TABS | 7 days supply | Qty: 30 | Fill #0

## 2017-11-18 MED FILL — ONDANSETRON/8MG/TABS: ONDANSETRON/8MG/TABS | 10 days supply | Qty: 30 | Fill #0

## 2017-11-20 ENCOUNTER — Ambulatory Visit: Admit: 2017-11-20 | Discharge: 2017-11-21 | Payer: MEDICARE

## 2017-11-20 DIAGNOSIS — C50412 Malignant neoplasm of upper-outer quadrant of left female breast: Principal | ICD-10-CM

## 2017-11-20 DIAGNOSIS — Z171 Estrogen receptor negative status [ER-]: Secondary | ICD-10-CM

## 2017-11-27 ENCOUNTER — Ambulatory Visit: Admit: 2017-11-27 | Discharge: 2017-11-28 | Payer: MEDICARE

## 2017-11-27 DIAGNOSIS — C50912 Malignant neoplasm of unspecified site of left female breast: Principal | ICD-10-CM

## 2017-11-27 DIAGNOSIS — Z5181 Encounter for therapeutic drug level monitoring: Secondary | ICD-10-CM

## 2017-11-27 DIAGNOSIS — Z79899 Other long term (current) drug therapy: Secondary | ICD-10-CM

## 2017-12-02 ENCOUNTER — Ambulatory Visit: Admit: 2017-12-02 | Discharge: 2017-12-02 | Payer: MEDICARE

## 2017-12-02 ENCOUNTER — Other Ambulatory Visit: Admit: 2017-12-02 | Discharge: 2017-12-02 | Payer: MEDICARE

## 2017-12-02 ENCOUNTER — Ambulatory Visit: Admit: 2017-12-02 | Discharge: 2017-12-02 | Payer: MEDICARE | Attending: Family | Primary: Family

## 2017-12-02 DIAGNOSIS — Z171 Estrogen receptor negative status [ER-]: Secondary | ICD-10-CM

## 2017-12-02 DIAGNOSIS — C50412 Malignant neoplasm of upper-outer quadrant of left female breast: Principal | ICD-10-CM

## 2017-12-02 DIAGNOSIS — Z5181 Encounter for therapeutic drug level monitoring: Secondary | ICD-10-CM

## 2017-12-02 DIAGNOSIS — C50912 Malignant neoplasm of unspecified site of left female breast: Principal | ICD-10-CM

## 2017-12-02 DIAGNOSIS — Z79899 Other long term (current) drug therapy: Secondary | ICD-10-CM

## 2017-12-03 MED FILL — DILANTIN/100MG/CAP: DILANTIN/100MG/CAP | 90 days supply | Qty: 270 | Fill #1

## 2017-12-07 ENCOUNTER — Ambulatory Visit: Admit: 2017-12-07 | Discharge: 2017-12-07 | Disposition: A | Discharge status: Home / Self Care | Payer: MEDICARE

## 2017-12-07 ENCOUNTER — Other Ambulatory Visit: Admit: 2017-12-07 | Discharge: 2017-12-07 | Disposition: A | Discharge status: Home / Self Care | Payer: MEDICARE

## 2017-12-07 DIAGNOSIS — Z171 Estrogen receptor negative status [ER-]: Secondary | ICD-10-CM

## 2017-12-07 DIAGNOSIS — G40109 Localization-related (focal) (partial) symptomatic epilepsy and epileptic syndromes with simple partial seizures, not intractable, without status epilepticus: Secondary | ICD-10-CM

## 2017-12-07 DIAGNOSIS — C50412 Malignant neoplasm of upper-outer quadrant of left female breast: Principal | ICD-10-CM

## 2017-12-07 DIAGNOSIS — C50912 Malignant neoplasm of unspecified site of left female breast: Secondary | ICD-10-CM

## 2017-12-14 ENCOUNTER — Ambulatory Visit: Admit: 2017-12-14 | Discharge: 2017-12-15 | Payer: MEDICARE | Attending: Family Medicine | Primary: Family Medicine

## 2017-12-14 DIAGNOSIS — Y92239 Unspecified place in hospital as the place of occurrence of the external cause: Secondary | ICD-10-CM

## 2017-12-14 DIAGNOSIS — R7989 Other specified abnormal findings of blood chemistry: Secondary | ICD-10-CM

## 2017-12-14 DIAGNOSIS — I10 Essential (primary) hypertension: Principal | ICD-10-CM

## 2017-12-14 DIAGNOSIS — F17200 Nicotine dependence, unspecified, uncomplicated: Secondary | ICD-10-CM

## 2017-12-14 DIAGNOSIS — Z111 Encounter for screening for respiratory tuberculosis: Secondary | ICD-10-CM

## 2017-12-14 DIAGNOSIS — R197 Diarrhea, unspecified: Secondary | ICD-10-CM

## 2017-12-14 DIAGNOSIS — C50912 Malignant neoplasm of unspecified site of left female breast: Secondary | ICD-10-CM

## 2017-12-14 DIAGNOSIS — L309 Dermatitis, unspecified: Secondary | ICD-10-CM

## 2017-12-15 ENCOUNTER — Ambulatory Visit: Admit: 2017-12-15 | Discharge: 2017-12-16 | Payer: MEDICARE

## 2017-12-15 DIAGNOSIS — E7800 Pure hypercholesterolemia, unspecified: Secondary | ICD-10-CM

## 2017-12-15 DIAGNOSIS — Z171 Estrogen receptor negative status [ER-]: Secondary | ICD-10-CM

## 2017-12-15 DIAGNOSIS — E559 Vitamin D deficiency, unspecified: Secondary | ICD-10-CM

## 2017-12-15 DIAGNOSIS — I10 Essential (primary) hypertension: Secondary | ICD-10-CM

## 2017-12-15 DIAGNOSIS — G40119 Localization-related (focal) (partial) symptomatic epilepsy and epileptic syndromes with simple partial seizures, intractable, without status epilepticus: Principal | ICD-10-CM

## 2017-12-15 DIAGNOSIS — C50412 Malignant neoplasm of upper-outer quadrant of left female breast: Secondary | ICD-10-CM

## 2017-12-15 DIAGNOSIS — C50912 Malignant neoplasm of unspecified site of left female breast: Secondary | ICD-10-CM

## 2017-12-15 DIAGNOSIS — F17200 Nicotine dependence, unspecified, uncomplicated: Secondary | ICD-10-CM

## 2017-12-15 MED FILL — CLONAZEPAM/1MG/TABS: CLONAZEPAM/1MG/TABS | 30 days supply | Qty: 60 | Fill #3

## 2017-12-15 MED FILL — PROCHLORPERAZINE/10MG/TABS: PROCHLORPERAZINE/10MG/TABS | 7 days supply | Qty: 30 | Fill #1

## 2017-12-15 MED FILL — ONDANSETRON/8MG/TABS: ONDANSETRON/8MG/TABS | 10 days supply | Qty: 30 | Fill #1

## 2017-12-16 ENCOUNTER — Ambulatory Visit: Admit: 2017-12-16 | Discharge: 2017-12-17 | Payer: MEDICARE

## 2017-12-16 DIAGNOSIS — R197 Diarrhea, unspecified: Principal | ICD-10-CM

## 2017-12-21 ENCOUNTER — Ambulatory Visit
Admit: 2017-12-21 | Discharge: 2017-12-25 | Disposition: A | Discharge status: Home Health | Payer: MEDICARE | Admitting: Neurology

## 2017-12-21 ENCOUNTER — Ambulatory Visit
Admit: 2017-12-21 | Discharge: 2017-12-25 | Disposition: A | Discharge status: Home Health | Payer: MEDICARE | Attending: Student in an Organized Health Care Education/Training Program | Admitting: Neurology

## 2017-12-21 ENCOUNTER — Other Ambulatory Visit
Admit: 2017-12-21 | Discharge: 2017-12-25 | Disposition: A | Discharge status: Home Health | Payer: MEDICARE | Admitting: Neurology

## 2017-12-21 ENCOUNTER — Ambulatory Visit
Admit: 2017-12-21 | Discharge: 2017-12-25 | Disposition: A | Discharge status: Home Health | Payer: MEDICARE | Attending: Hematology & Oncology | Admitting: Neurology

## 2017-12-21 DIAGNOSIS — C50412 Malignant neoplasm of upper-outer quadrant of left female breast: Principal | ICD-10-CM

## 2017-12-21 DIAGNOSIS — G40109 Localization-related (focal) (partial) symptomatic epilepsy and epileptic syndromes with simple partial seizures, not intractable, without status epilepticus: Principal | ICD-10-CM

## 2017-12-21 DIAGNOSIS — Z171 Estrogen receptor negative status [ER-]: Secondary | ICD-10-CM

## 2017-12-21 DIAGNOSIS — R296 Repeated falls: Principal | ICD-10-CM

## 2017-12-25 MED ORDER — MAGNESIUM OXIDE 400 MG (241.3 MG MAGNESIUM) TABLET
ORAL_TABLET | Freq: Every day | ORAL | 0 refills | 0.00000 days
Start: 2017-12-25 — End: 2018-12-24

## 2017-12-25 MED ORDER — LEVETIRACETAM 500 MG TABLET
ORAL_TABLET | ORAL | 3 refills | 0.00000 days | Status: CP
Start: 2017-12-25 — End: 2018-02-23

## 2017-12-28 ENCOUNTER — Ambulatory Visit: Admit: 2017-12-28 | Discharge: 2017-12-29 | Payer: MEDICARE | Attending: Family | Primary: Family

## 2017-12-28 ENCOUNTER — Ambulatory Visit: Admit: 2017-12-28 | Discharge: 2017-12-29 | Payer: MEDICARE

## 2017-12-28 ENCOUNTER — Other Ambulatory Visit: Admit: 2017-12-28 | Discharge: 2017-12-29 | Payer: MEDICARE

## 2017-12-28 DIAGNOSIS — C50412 Malignant neoplasm of upper-outer quadrant of left female breast: Principal | ICD-10-CM

## 2017-12-28 DIAGNOSIS — Z171 Estrogen receptor negative status [ER-]: Secondary | ICD-10-CM

## 2017-12-28 MED ORDER — PROCHLORPERAZINE MALEATE 10 MG TABLET: 10 mg | tablet | Freq: Four times a day (QID) | 2 refills | 0 days | Status: AC

## 2017-12-28 MED ORDER — ONDANSETRON HCL 8 MG TABLET: 8 mg | tablet | Freq: Three times a day (TID) | 2 refills | 0 days | Status: AC

## 2017-12-28 MED ORDER — PROCHLORPERAZINE MALEATE 10 MG TABLET
ORAL_TABLET | Freq: Four times a day (QID) | ORAL | 2 refills | 0.00000 days | Status: CP | PRN
Start: 2017-12-28 — End: 2018-04-27

## 2017-12-28 MED ORDER — ONDANSETRON HCL 8 MG TABLET
ORAL_TABLET | Freq: Three times a day (TID) | ORAL | 2 refills | 0.00000 days | Status: CP | PRN
Start: 2017-12-28 — End: 2018-06-15

## 2017-12-28 MED FILL — PROCHLORPERAZINE/10MG/TABS: PROCHLORPERAZINE/10MG/TABS | 8 days supply | Qty: 30 | Fill #0

## 2017-12-28 MED FILL — ONDANSETRON/8MG/TABS: ONDANSETRON/8MG/TABS | 10 days supply | Qty: 30 | Fill #0

## 2018-01-04 ENCOUNTER — Ambulatory Visit
Admit: 2018-01-04 | Discharge: 2018-01-05 | Payer: MEDICARE | Attending: Hematology & Oncology | Primary: Hematology & Oncology

## 2018-01-04 ENCOUNTER — Other Ambulatory Visit: Admit: 2018-01-04 | Discharge: 2018-01-05 | Payer: MEDICARE

## 2018-01-04 ENCOUNTER — Ambulatory Visit: Admit: 2018-01-04 | Discharge: 2018-01-05 | Payer: MEDICARE

## 2018-01-04 DIAGNOSIS — Z171 Estrogen receptor negative status [ER-]: Secondary | ICD-10-CM

## 2018-01-04 DIAGNOSIS — C50412 Malignant neoplasm of upper-outer quadrant of left female breast: Principal | ICD-10-CM

## 2018-01-11 ENCOUNTER — Other Ambulatory Visit: Admit: 2018-01-11 | Discharge: 2018-01-12 | Payer: MEDICARE

## 2018-01-11 ENCOUNTER — Ambulatory Visit
Admit: 2018-01-11 | Discharge: 2018-01-12 | Payer: MEDICARE | Attending: Hematology & Oncology | Primary: Hematology & Oncology

## 2018-01-11 ENCOUNTER — Ambulatory Visit: Admit: 2018-01-11 | Discharge: 2018-01-12 | Payer: MEDICARE

## 2018-01-11 DIAGNOSIS — Z171 Estrogen receptor negative status [ER-]: Secondary | ICD-10-CM

## 2018-01-11 DIAGNOSIS — C50412 Malignant neoplasm of upper-outer quadrant of left female breast: Principal | ICD-10-CM

## 2018-01-15 MED FILL — CLONAZEPAM/1MG/TABS: CLONAZEPAM/1MG/TABS | 30 days supply | Qty: 60 | Fill #4

## 2018-01-18 ENCOUNTER — Other Ambulatory Visit: Admit: 2018-01-18 | Discharge: 2018-01-19 | Payer: MEDICARE

## 2018-01-18 ENCOUNTER — Ambulatory Visit: Admit: 2018-01-18 | Discharge: 2018-01-19 | Payer: MEDICARE

## 2018-01-18 DIAGNOSIS — C50412 Malignant neoplasm of upper-outer quadrant of left female breast: Principal | ICD-10-CM

## 2018-01-18 DIAGNOSIS — Z171 Estrogen receptor negative status [ER-]: Secondary | ICD-10-CM

## 2018-01-21 ENCOUNTER — Emergency Department: Admit: 2018-01-21 | Discharge: 2018-01-22 | Disposition: A | Discharge status: Home / Self Care | Payer: MEDICARE

## 2018-01-21 ENCOUNTER — Ambulatory Visit: Admit: 2018-01-21 | Discharge: 2018-01-22 | Payer: MEDICARE

## 2018-01-21 ENCOUNTER — Ambulatory Visit: Admit: 2018-01-21 | Discharge: 2018-01-22 | Disposition: A | Discharge status: Home / Self Care | Payer: MEDICARE

## 2018-01-21 DIAGNOSIS — R509 Fever, unspecified: Principal | ICD-10-CM

## 2018-01-21 DIAGNOSIS — J029 Acute pharyngitis, unspecified: Secondary | ICD-10-CM

## 2018-01-21 DIAGNOSIS — E860 Dehydration: Secondary | ICD-10-CM

## 2018-01-21 MED ORDER — LEVOFLOXACIN 750 MG TABLET
ORAL_TABLET | Freq: Every day | ORAL | 0 refills | 0 days | Status: CP
Start: 2018-01-21 — End: 2018-01-27

## 2018-01-26 MED ORDER — LEVOFLOXACIN 750 MG TABLET
ORAL | 0 refills | 0 days
Start: 2018-01-26 — End: 2018-04-27

## 2018-01-26 MED FILL — LEVOFLOXACIN/750MG/TABS: LEVOFLOXACIN/750MG/TABS | 5 days supply | Qty: 5 | Fill #0

## 2018-01-27 ENCOUNTER — Other Ambulatory Visit: Admit: 2018-01-27 | Discharge: 2018-01-28 | Payer: MEDICARE

## 2018-01-27 ENCOUNTER — Ambulatory Visit: Admit: 2018-01-27 | Discharge: 2018-01-28 | Payer: MEDICARE | Attending: Family | Primary: Family

## 2018-01-27 ENCOUNTER — Ambulatory Visit: Admit: 2018-01-27 | Discharge: 2018-01-28 | Payer: MEDICARE

## 2018-01-27 DIAGNOSIS — Z171 Estrogen receptor negative status [ER-]: Secondary | ICD-10-CM

## 2018-01-27 DIAGNOSIS — C50412 Malignant neoplasm of upper-outer quadrant of left female breast: Principal | ICD-10-CM

## 2018-01-28 ENCOUNTER — Emergency Department
Admit: 2018-01-28 | Discharge: 2018-01-28 | Disposition: A | Discharge status: Home / Self Care | Payer: MEDICARE | Attending: Family

## 2018-01-28 ENCOUNTER — Ambulatory Visit
Admit: 2018-01-28 | Discharge: 2018-01-28 | Disposition: A | Discharge status: Home / Self Care | Payer: MEDICARE | Attending: Family

## 2018-01-28 DIAGNOSIS — S0990XA Unspecified injury of head, initial encounter: Principal | ICD-10-CM

## 2018-02-02 MED ORDER — ATORVASTATIN 40 MG TABLET
ORAL_TABLET | Freq: Every day | ORAL | 0 refills | 0.00000 days | Status: CP
Start: 2018-02-02 — End: 2018-04-07

## 2018-02-03 ENCOUNTER — Other Ambulatory Visit: Admit: 2018-02-03 | Discharge: 2018-02-04 | Payer: MEDICARE

## 2018-02-03 ENCOUNTER — Ambulatory Visit: Admit: 2018-02-03 | Discharge: 2018-02-04 | Payer: MEDICARE

## 2018-02-03 ENCOUNTER — Ambulatory Visit: Admit: 2018-02-03 | Discharge: 2018-02-04 | Payer: MEDICARE | Attending: Family | Primary: Family

## 2018-02-03 DIAGNOSIS — C50412 Malignant neoplasm of upper-outer quadrant of left female breast: Principal | ICD-10-CM

## 2018-02-03 DIAGNOSIS — Z171 Estrogen receptor negative status [ER-]: Secondary | ICD-10-CM

## 2018-02-03 DIAGNOSIS — K529 Noninfective gastroenteritis and colitis, unspecified: Principal | ICD-10-CM

## 2018-02-11 ENCOUNTER — Ambulatory Visit: Admit: 2018-02-11 | Discharge: 2018-02-12 | Payer: MEDICARE | Attending: Surgical Oncology | Primary: Surgical Oncology

## 2018-02-11 DIAGNOSIS — C50412 Malignant neoplasm of upper-outer quadrant of left female breast: Principal | ICD-10-CM

## 2018-02-11 DIAGNOSIS — Z171 Estrogen receptor negative status [ER-]: Secondary | ICD-10-CM

## 2018-02-12 ENCOUNTER — Ambulatory Visit: Admit: 2018-02-12 | Discharge: 2018-02-13 | Payer: MEDICARE

## 2018-02-12 DIAGNOSIS — A048 Other specified bacterial intestinal infections: Secondary | ICD-10-CM

## 2018-02-12 DIAGNOSIS — F419 Anxiety disorder, unspecified: Secondary | ICD-10-CM

## 2018-02-12 DIAGNOSIS — R197 Diarrhea, unspecified: Secondary | ICD-10-CM

## 2018-02-12 DIAGNOSIS — K529 Noninfective gastroenteritis and colitis, unspecified: Principal | ICD-10-CM

## 2018-02-15 MED FILL — GABAPENTIN/300MG/CAPS: GABAPENTIN/300MG/CAPS | 90 days supply | Qty: 270 | Fill #0

## 2018-02-15 MED FILL — CLONAZEPAM/1MG/TABS: CLONAZEPAM/1MG/TABS | 30 days supply | Qty: 60 | Fill #5

## 2018-02-17 ENCOUNTER — Ambulatory Visit: Admit: 2018-02-17 | Discharge: 2018-02-18 | Payer: MEDICARE

## 2018-02-17 ENCOUNTER — Ambulatory Visit: Admit: 2018-02-17 | Discharge: 2018-02-18 | Payer: MEDICARE | Attending: Family Medicine | Primary: Family Medicine

## 2018-02-17 DIAGNOSIS — I2510 Atherosclerotic heart disease of native coronary artery without angina pectoris: Secondary | ICD-10-CM

## 2018-02-17 DIAGNOSIS — E876 Hypokalemia: Secondary | ICD-10-CM

## 2018-02-17 DIAGNOSIS — R197 Diarrhea, unspecified: Secondary | ICD-10-CM

## 2018-02-17 DIAGNOSIS — E0590 Thyrotoxicosis, unspecified without thyrotoxic crisis or storm: Principal | ICD-10-CM

## 2018-02-18 ENCOUNTER — Ambulatory Visit: Admit: 2018-02-18 | Discharge: 2018-02-19 | Payer: MEDICARE

## 2018-02-18 DIAGNOSIS — E0590 Thyrotoxicosis, unspecified without thyrotoxic crisis or storm: Principal | ICD-10-CM

## 2018-02-19 ENCOUNTER — Ambulatory Visit: Admit: 2018-02-19 | Discharge: 2018-02-20 | Payer: MEDICARE

## 2018-02-19 DIAGNOSIS — C50912 Malignant neoplasm of unspecified site of left female breast: Principal | ICD-10-CM

## 2018-02-19 MED ORDER — METHIMAZOLE 5 MG TABLET: tablet | 1 refills | 0 days

## 2018-02-19 MED ORDER — METHIMAZOLE 5 MG TABLET
ORAL_TABLET | Freq: Two times a day (BID) | ORAL | 1 refills | 0.00000 days | Status: CP
Start: 2018-02-19 — End: 2018-02-19

## 2018-02-22 MED FILL — METHIMAZOLE/5MG/TAB: METHIMAZOLE/5MG/TAB | 30 days supply | Qty: 60 | Fill #0

## 2018-02-24 MED ORDER — GABAPENTIN 300 MG CAPSULE: 300 mg | capsule | 3 refills | 0 days | Status: AC

## 2018-02-24 MED ORDER — CLONAZEPAM 1 MG TABLET: 1 mg | tablet | Freq: Two times a day (BID) | 5 refills | 0 days | Status: AC

## 2018-02-24 MED ORDER — LEVETIRACETAM 500 MG TABLET
ORAL_TABLET | 3 refills | 0.00000 days | Status: CP
Start: 2018-02-24 — End: 2018-06-15

## 2018-02-24 MED ORDER — CLONAZEPAM 1 MG TABLET
ORAL_TABLET | Freq: Two times a day (BID) | ORAL | 5 refills | 0.00000 days | Status: CP
Start: 2018-02-24 — End: 2018-02-24
  Filled 2018-04-23: qty 60, 30d supply, fill #0

## 2018-02-24 MED ORDER — LEVETIRACETAM 500 MG TABLET: tablet | 3 refills | 0 days

## 2018-02-24 MED ORDER — GABAPENTIN 300 MG CAPSULE
ORAL_CAPSULE | Freq: Three times a day (TID) | ORAL | 3 refills | 0.00000 days | Status: CP
Start: 2018-02-24 — End: 2018-10-12
  Filled 2018-05-06: qty 270, 90d supply, fill #0

## 2018-03-01 ENCOUNTER — Encounter
Admit: 2018-03-01 | Discharge: 2018-03-02 | Payer: MEDICARE | Attending: Certified Registered" | Primary: Certified Registered"

## 2018-03-01 ENCOUNTER — Ambulatory Visit: Admit: 2018-03-01 | Discharge: 2018-03-02 | Payer: MEDICARE

## 2018-03-02 DIAGNOSIS — K529 Noninfective gastroenteritis and colitis, unspecified: Principal | ICD-10-CM

## 2018-03-09 ENCOUNTER — Ambulatory Visit: Admit: 2018-03-09 | Discharge: 2018-03-10 | Payer: MEDICARE

## 2018-03-09 DIAGNOSIS — Z01818 Encounter for other preprocedural examination: Principal | ICD-10-CM

## 2018-03-09 DIAGNOSIS — R569 Unspecified convulsions: Secondary | ICD-10-CM

## 2018-03-09 DIAGNOSIS — Z720 Tobacco use: Secondary | ICD-10-CM

## 2018-03-09 DIAGNOSIS — C50912 Malignant neoplasm of unspecified site of left female breast: Secondary | ICD-10-CM

## 2018-03-09 DIAGNOSIS — I509 Heart failure, unspecified: Secondary | ICD-10-CM

## 2018-03-09 DIAGNOSIS — E049 Nontoxic goiter, unspecified: Secondary | ICD-10-CM

## 2018-03-09 DIAGNOSIS — E785 Hyperlipidemia, unspecified: Secondary | ICD-10-CM

## 2018-03-09 DIAGNOSIS — I2510 Atherosclerotic heart disease of native coronary artery without angina pectoris: Secondary | ICD-10-CM

## 2018-03-09 DIAGNOSIS — I10 Essential (primary) hypertension: Secondary | ICD-10-CM

## 2018-03-15 DIAGNOSIS — L7632 Postprocedural hematoma of skin and subcutaneous tissue following other procedure: Principal | ICD-10-CM

## 2018-03-15 MED FILL — CLONAZEPAM/1MG/TAB: CLONAZEPAM/1MG/TAB | 30 days supply | Qty: 60 | Fill #0

## 2018-03-16 DIAGNOSIS — L7632 Postprocedural hematoma of skin and subcutaneous tissue following other procedure: Principal | ICD-10-CM

## 2018-03-16 DIAGNOSIS — C50912 Malignant neoplasm of unspecified site of left female breast: Principal | ICD-10-CM

## 2018-03-16 MED ORDER — OXYCODONE 5 MG TABLET
ORAL_TABLET | ORAL | 0 refills | 0 days | Status: SS | PRN
Start: 2018-03-16 — End: 2018-03-20

## 2018-03-18 ENCOUNTER — Ambulatory Visit
Admit: 2018-03-18 | Discharge: 2018-03-20 | Disposition: A | Discharge status: Home / Self Care | Payer: MEDICARE | Admitting: Surgical Oncology

## 2018-03-18 ENCOUNTER — Encounter
Admit: 2018-03-18 | Discharge: 2018-03-20 | Disposition: A | Discharge status: Home / Self Care | Payer: MEDICARE | Attending: Student in an Organized Health Care Education/Training Program | Admitting: Surgical Oncology

## 2018-03-18 DIAGNOSIS — L7632 Postprocedural hematoma of skin and subcutaneous tissue following other procedure: Principal | ICD-10-CM

## 2018-03-19 DIAGNOSIS — L7632 Postprocedural hematoma of skin and subcutaneous tissue following other procedure: Principal | ICD-10-CM

## 2018-03-20 MED ORDER — OXYCODONE 5 MG TABLET: 5 mg | tablet | 0 refills | 0 days | Status: AC

## 2018-03-20 MED ORDER — ACETAMINOPHEN 325 MG TABLET
Freq: Four times a day (QID) | ORAL | 0 refills | 0.00000 days | PRN
Start: 2018-03-20 — End: 2018-05-10

## 2018-03-20 MED ORDER — OXYCODONE 5 MG TABLET
ORAL_TABLET | ORAL | 0 refills | 0.00000 days | Status: CP | PRN
Start: 2018-03-20 — End: 2018-03-20

## 2018-03-20 MED FILL — OXYCODONE HCL/5MG/TABS: OXYCODONE HCL/5MG/TABS | 1 days supply | Qty: 10 | Fill #0

## 2018-03-23 ENCOUNTER — Ambulatory Visit: Admit: 2018-03-23 | Discharge: 2018-03-24 | Payer: MEDICARE | Attending: Family Medicine | Primary: Family Medicine

## 2018-03-23 DIAGNOSIS — E0590 Thyrotoxicosis, unspecified without thyrotoxic crisis or storm: Principal | ICD-10-CM

## 2018-03-23 DIAGNOSIS — S20212A Contusion of left front wall of thorax, initial encounter: Secondary | ICD-10-CM

## 2018-03-23 DIAGNOSIS — I509 Heart failure, unspecified: Secondary | ICD-10-CM

## 2018-03-23 DIAGNOSIS — I10 Essential (primary) hypertension: Secondary | ICD-10-CM

## 2018-03-23 DIAGNOSIS — C50912 Malignant neoplasm of unspecified site of left female breast: Secondary | ICD-10-CM

## 2018-03-25 ENCOUNTER — Ambulatory Visit: Admit: 2018-03-25 | Discharge: 2018-03-26 | Payer: MEDICARE | Attending: Surgical Oncology | Primary: Surgical Oncology

## 2018-03-25 DIAGNOSIS — C50912 Malignant neoplasm of unspecified site of left female breast: Principal | ICD-10-CM

## 2018-03-26 ENCOUNTER — Ambulatory Visit
Admit: 2018-03-26 | Discharge: 2018-03-26 | Disposition: A | Discharge status: Home / Self Care | Payer: MEDICARE | Attending: Emergency Medicine

## 2018-04-01 ENCOUNTER — Ambulatory Visit
Admit: 2018-04-01 | Discharge: 2018-05-01 | Payer: MEDICARE | Attending: Radiation Oncology | Primary: Radiation Oncology

## 2018-04-01 ENCOUNTER — Ambulatory Visit: Admit: 2018-04-01 | Discharge: 2018-05-01 | Payer: MEDICARE

## 2018-04-01 ENCOUNTER — Ambulatory Visit
Admit: 2018-04-01 | Discharge: 2018-04-02 | Payer: MEDICARE | Attending: Nurse Practitioner | Primary: Nurse Practitioner

## 2018-04-01 DIAGNOSIS — C50412 Malignant neoplasm of upper-outer quadrant of left female breast: Principal | ICD-10-CM

## 2018-04-01 DIAGNOSIS — C50912 Malignant neoplasm of unspecified site of left female breast: Principal | ICD-10-CM

## 2018-04-05 MED FILL — METHIMAZOLE/5MG/TAB: METHIMAZOLE/5MG/TAB | 30 days supply | Qty: 60 | Fill #1

## 2018-04-07 MED ORDER — ATORVASTATIN 40 MG TABLET
ORAL_TABLET | 0 refills | 0 days | Status: CP
Start: 2018-04-07 — End: 2018-05-06

## 2018-04-11 ENCOUNTER — Ambulatory Visit
Admit: 2018-04-11 | Discharge: 2018-04-11 | Disposition: A | Discharge status: Home / Self Care | Payer: MEDICARE | Attending: Emergency Medicine

## 2018-04-13 ENCOUNTER — Ambulatory Visit: Admit: 2018-04-13 | Discharge: 2018-04-14 | Payer: MEDICARE | Attending: Adult Health | Primary: Adult Health

## 2018-04-13 DIAGNOSIS — C50412 Malignant neoplasm of upper-outer quadrant of left female breast: Principal | ICD-10-CM

## 2018-04-13 DIAGNOSIS — Z171 Estrogen receptor negative status [ER-]: Secondary | ICD-10-CM

## 2018-04-13 MED ORDER — SILVER SULFADIAZINE 1 % TOPICAL CREAM
1 refills | 0.00000 days | Status: CP
Start: 2018-04-13 — End: 2018-09-24
  Filled 2018-04-13: qty 50, 10d supply, fill #0

## 2018-04-13 MED FILL — SILVER SULFADIAZINE 1 % TOPICAL CREAM: 10 days supply | Qty: 50 | Fill #0 | Status: AC

## 2018-04-21 DIAGNOSIS — Z171 Estrogen receptor negative status [ER-]: Secondary | ICD-10-CM

## 2018-04-21 DIAGNOSIS — C50412 Malignant neoplasm of upper-outer quadrant of left female breast: Principal | ICD-10-CM

## 2018-04-21 DIAGNOSIS — N6489 Other specified disorders of breast: Secondary | ICD-10-CM

## 2018-04-23 MED FILL — CLONAZEPAM 1 MG TABLET: 30 days supply | Qty: 60 | Fill #0 | Status: AC

## 2018-04-27 ENCOUNTER — Ambulatory Visit
Admit: 2018-04-27 | Discharge: 2018-04-28 | Payer: MEDICARE | Attending: Hematology & Oncology | Primary: Hematology & Oncology

## 2018-04-27 DIAGNOSIS — Z171 Estrogen receptor negative status [ER-]: Secondary | ICD-10-CM

## 2018-04-27 DIAGNOSIS — C50412 Malignant neoplasm of upper-outer quadrant of left female breast: Principal | ICD-10-CM

## 2018-04-27 DIAGNOSIS — R011 Cardiac murmur, unspecified: Secondary | ICD-10-CM

## 2018-05-05 MED ORDER — CARVEDILOL 25 MG TABLET
ORAL_TABLET | 3 refills | 0 days | Status: CP
Start: 2018-05-05 — End: 2019-04-27

## 2018-05-06 MED FILL — GABAPENTIN 300 MG CAPSULE: 90 days supply | Qty: 270 | Fill #0 | Status: AC

## 2018-05-10 ENCOUNTER — Ambulatory Visit: Admit: 2018-05-10 | Discharge: 2018-05-11 | Payer: MEDICARE | Attending: Family Medicine | Primary: Family Medicine

## 2018-05-10 DIAGNOSIS — I10 Essential (primary) hypertension: Secondary | ICD-10-CM

## 2018-05-10 DIAGNOSIS — E0590 Thyrotoxicosis, unspecified without thyrotoxic crisis or storm: Principal | ICD-10-CM

## 2018-05-10 MED ORDER — ATORVASTATIN 40 MG TABLET
ORAL_TABLET | Freq: Every day | ORAL | 1 refills | 0 days | Status: CP
Start: 2018-05-10 — End: 2018-06-16

## 2018-05-10 MED ORDER — AMLODIPINE 10 MG TABLET
ORAL_TABLET | Freq: Every day | ORAL | 1 refills | 0.00000 days | Status: CP
Start: 2018-05-10 — End: ?

## 2018-05-10 MED ORDER — METHIMAZOLE 5 MG TABLET
ORAL_TABLET | Freq: Two times a day (BID) | ORAL | 1 refills | 0 days | Status: CP
Start: 2018-05-10 — End: 2018-12-24

## 2018-05-18 ENCOUNTER — Other Ambulatory Visit: Admit: 2018-05-18 | Discharge: 2018-05-19 | Payer: MEDICARE

## 2018-05-18 ENCOUNTER — Ambulatory Visit
Admit: 2018-05-18 | Discharge: 2018-05-19 | Payer: MEDICARE | Attending: Hematology & Oncology | Primary: Hematology & Oncology

## 2018-05-18 DIAGNOSIS — C50412 Malignant neoplasm of upper-outer quadrant of left female breast: Principal | ICD-10-CM

## 2018-05-18 DIAGNOSIS — Z171 Estrogen receptor negative status [ER-]: Secondary | ICD-10-CM

## 2018-05-18 DIAGNOSIS — E0590 Thyrotoxicosis, unspecified without thyrotoxic crisis or storm: Secondary | ICD-10-CM

## 2018-05-18 DIAGNOSIS — C50912 Malignant neoplasm of unspecified site of left female breast: Principal | ICD-10-CM

## 2018-05-25 ENCOUNTER — Ambulatory Visit
Admit: 2018-05-25 | Discharge: 2018-05-26 | Payer: MEDICARE | Attending: Cardiovascular Disease | Primary: Cardiovascular Disease

## 2018-05-25 ENCOUNTER — Ambulatory Visit: Admit: 2018-05-25 | Discharge: 2018-05-26 | Payer: MEDICARE

## 2018-05-25 DIAGNOSIS — Z171 Estrogen receptor negative status [ER-]: Secondary | ICD-10-CM

## 2018-05-25 DIAGNOSIS — I110 Hypertensive heart disease with heart failure: Secondary | ICD-10-CM

## 2018-05-25 DIAGNOSIS — I10 Essential (primary) hypertension: Secondary | ICD-10-CM

## 2018-05-25 DIAGNOSIS — I2510 Atherosclerotic heart disease of native coronary artery without angina pectoris: Secondary | ICD-10-CM

## 2018-05-25 DIAGNOSIS — I5032 Chronic diastolic (congestive) heart failure: Secondary | ICD-10-CM

## 2018-05-25 DIAGNOSIS — C50412 Malignant neoplasm of upper-outer quadrant of left female breast: Principal | ICD-10-CM

## 2018-05-25 DIAGNOSIS — R011 Cardiac murmur, unspecified: Principal | ICD-10-CM

## 2018-05-25 MED ORDER — SPIRONOLACTONE 25 MG TABLET
ORAL_TABLET | Freq: Every day | ORAL | 3 refills | 0.00000 days | Status: CP
Start: 2018-05-25 — End: 2019-02-10
  Filled 2018-05-26: qty 90, 90d supply, fill #0

## 2018-05-25 MED ORDER — FUROSEMIDE 20 MG TABLET
ORAL_TABLET | Freq: Every day | ORAL | 3 refills | 0 days | Status: CP | PRN
Start: 2018-05-25 — End: 2019-01-05
  Filled 2018-05-26: qty 15, 15d supply, fill #0

## 2018-05-26 MED FILL — FUROSEMIDE 20 MG TABLET: 15 days supply | Qty: 15 | Fill #0 | Status: AC

## 2018-05-26 MED FILL — CLONAZEPAM 1 MG TABLET: 30 days supply | Qty: 60 | Fill #1 | Status: AC

## 2018-05-26 MED FILL — CLONAZEPAM 1 MG TABLET: ORAL | 30 days supply | Qty: 60 | Fill #1

## 2018-05-26 MED FILL — SPIRONOLACTONE 25 MG TABLET: 90 days supply | Qty: 90 | Fill #0 | Status: AC

## 2018-06-02 ENCOUNTER — Ambulatory Visit: Admit: 2018-06-02 | Discharge: 2018-06-03 | Payer: MEDICARE

## 2018-06-02 ENCOUNTER — Ambulatory Visit: Admit: 2018-06-02 | Discharge: 2018-06-03 | Payer: MEDICARE | Attending: Family | Primary: Family

## 2018-06-02 ENCOUNTER — Other Ambulatory Visit: Admit: 2018-06-02 | Discharge: 2018-06-03 | Payer: MEDICARE

## 2018-06-02 DIAGNOSIS — I5032 Chronic diastolic (congestive) heart failure: Secondary | ICD-10-CM

## 2018-06-02 DIAGNOSIS — Z171 Estrogen receptor negative status [ER-]: Secondary | ICD-10-CM

## 2018-06-02 DIAGNOSIS — C50412 Malignant neoplasm of upper-outer quadrant of left female breast: Principal | ICD-10-CM

## 2018-06-02 DIAGNOSIS — C50912 Malignant neoplasm of unspecified site of left female breast: Principal | ICD-10-CM

## 2018-06-02 MED ORDER — ONDANSETRON HCL 8 MG TABLET
ORAL_TABLET | Freq: Three times a day (TID) | ORAL | 2 refills | 0.00000 days | Status: CP | PRN
Start: 2018-06-02 — End: 2018-06-05
  Filled 2018-06-02: qty 30, 10d supply, fill #0

## 2018-06-02 MED ORDER — DEXAMETHASONE 4 MG TABLET
ORAL_TABLET | Freq: Every day | ORAL | 0 refills | 0.00000 days | Status: CP
Start: 2018-06-02 — End: 2018-06-15
  Filled 2018-06-02: qty 36, 84d supply, fill #0

## 2018-06-02 MED ORDER — PROCHLORPERAZINE MALEATE 10 MG TABLET
ORAL_TABLET | Freq: Four times a day (QID) | ORAL | 2 refills | 0.00000 days | Status: CP | PRN
Start: 2018-06-02 — End: 2018-06-05
  Filled 2018-06-02: qty 30, 8d supply, fill #0

## 2018-06-02 MED FILL — PROCHLORPERAZINE MALEATE 10 MG TABLET: 8 days supply | Qty: 30 | Fill #0 | Status: AC

## 2018-06-02 MED FILL — DEXAMETHASONE 4 MG TABLET: 84 days supply | Qty: 36 | Fill #0 | Status: AC

## 2018-06-02 MED FILL — ONDANSETRON HCL 8 MG TABLET: 10 days supply | Qty: 30 | Fill #0 | Status: AC

## 2018-06-04 ENCOUNTER — Ambulatory Visit: Admit: 2018-06-04 | Discharge: 2018-06-05 | Payer: MEDICARE

## 2018-06-04 DIAGNOSIS — R079 Chest pain, unspecified: Principal | ICD-10-CM

## 2018-06-05 DIAGNOSIS — E049 Nontoxic goiter, unspecified: Secondary | ICD-10-CM | POA: Insufficient documentation

## 2018-06-05 MED ORDER — POTASSIUM CHLORIDE ER 10 MEQ TABLET,EXTENDED RELEASE
ORAL_TABLET | Freq: Every day | ORAL | 11 refills | 0.00000 days
Start: 2018-06-05 — End: ?

## 2018-06-15 ENCOUNTER — Ambulatory Visit: Admit: 2018-06-15 | Discharge: 2018-06-16 | Payer: MEDICARE

## 2018-06-15 ENCOUNTER — Ambulatory Visit
Admit: 2018-06-15 | Discharge: 2018-06-16 | Payer: MEDICARE | Attending: Hematology & Oncology | Primary: Hematology & Oncology

## 2018-06-15 ENCOUNTER — Other Ambulatory Visit: Admit: 2018-06-15 | Discharge: 2018-06-16 | Payer: MEDICARE

## 2018-06-15 DIAGNOSIS — C50912 Malignant neoplasm of unspecified site of left female breast: Principal | ICD-10-CM

## 2018-06-15 DIAGNOSIS — C50412 Malignant neoplasm of upper-outer quadrant of left female breast: Secondary | ICD-10-CM

## 2018-06-15 DIAGNOSIS — Z171 Estrogen receptor negative status [ER-]: Secondary | ICD-10-CM

## 2018-06-15 MED ORDER — ONDANSETRON HCL 8 MG TABLET
ORAL_TABLET | Freq: Three times a day (TID) | ORAL | 2 refills | 0 days | Status: CP | PRN
Start: 2018-06-15 — End: 2019-02-10
  Filled 2018-06-15: qty 30, 10d supply, fill #0

## 2018-06-15 MED ORDER — DEXAMETHASONE 4 MG TABLET
ORAL_TABLET | Freq: Every day | ORAL | 0 refills | 0.00000 days | Status: CP
Start: 2018-06-15 — End: 2019-02-10

## 2018-06-15 MED ORDER — LEVETIRACETAM 500 MG TABLET
ORAL_TABLET | 4 refills | 0 days | Status: CP
Start: 2018-06-15 — End: 2018-07-22

## 2018-06-15 MED FILL — ONDANSETRON HCL 8 MG TABLET: 10 days supply | Qty: 30 | Fill #0 | Status: AC

## 2018-06-17 MED ORDER — ATORVASTATIN 40 MG TABLET
ORAL_TABLET | 1 refills | 0 days | Status: CP
Start: 2018-06-17 — End: 2018-11-15

## 2018-06-24 ENCOUNTER — Ambulatory Visit: Admit: 2018-06-24 | Discharge: 2018-06-24 | Payer: MEDICARE | Attending: Family | Primary: Family

## 2018-06-24 ENCOUNTER — Ambulatory Visit: Admit: 2018-06-24 | Discharge: 2018-06-24 | Payer: MEDICARE

## 2018-06-24 DIAGNOSIS — D72829 Elevated white blood cell count, unspecified: Principal | ICD-10-CM

## 2018-06-24 DIAGNOSIS — B370 Candidal stomatitis: Secondary | ICD-10-CM

## 2018-06-24 MED ORDER — FLUCONAZOLE 200 MG TABLET: 200 mg | tablet | Freq: Every day | 0 refills | 0 days | Status: AC

## 2018-06-24 MED ORDER — NYSTATIN 100,000 UNIT/ML ORAL SUSPENSION
Freq: Four times a day (QID) | ORAL | 0 refills | 0.00000 days | Status: CP
Start: 2018-06-24 — End: 2018-06-29
  Filled 2018-06-24: qty 100, 5d supply, fill #0

## 2018-06-24 MED ORDER — LOSARTAN 100 MG TABLET
ORAL_TABLET | Freq: Every day | ORAL | 3 refills | 0.00000 days | Status: CP
Start: 2018-06-24 — End: ?

## 2018-06-24 MED ORDER — FLUCONAZOLE 200 MG TABLET
ORAL_TABLET | Freq: Every day | ORAL | 0 refills | 0.00000 days | Status: CP
Start: 2018-06-24 — End: 2018-07-08
  Filled 2018-06-24: qty 15, 14d supply, fill #0

## 2018-06-24 MED FILL — FLUCONAZOLE 200 MG TABLET: 14 days supply | Qty: 15 | Fill #0 | Status: AC

## 2018-06-24 MED FILL — NYSTATIN 100,000 UNIT/ML ORAL SUSPENSION: 5 days supply | Qty: 100 | Fill #0 | Status: AC

## 2018-06-28 MED FILL — CLONAZEPAM 1 MG TABLET: ORAL | 30 days supply | Qty: 60 | Fill #2

## 2018-06-28 MED FILL — CLONAZEPAM 1 MG TABLET: 30 days supply | Qty: 60 | Fill #2 | Status: AC

## 2018-06-29 ENCOUNTER — Ambulatory Visit: Admit: 2018-06-29 | Discharge: 2018-06-30 | Payer: MEDICARE

## 2018-06-29 ENCOUNTER — Ambulatory Visit
Admit: 2018-06-29 | Discharge: 2018-06-30 | Payer: MEDICARE | Attending: Hematology & Oncology | Primary: Hematology & Oncology

## 2018-06-29 ENCOUNTER — Other Ambulatory Visit: Admit: 2018-06-29 | Discharge: 2018-06-30 | Payer: MEDICARE

## 2018-06-29 DIAGNOSIS — C50912 Malignant neoplasm of unspecified site of left female breast: Principal | ICD-10-CM

## 2018-06-29 DIAGNOSIS — C50412 Malignant neoplasm of upper-outer quadrant of left female breast: Secondary | ICD-10-CM

## 2018-06-29 DIAGNOSIS — D630 Anemia in neoplastic disease: Principal | ICD-10-CM

## 2018-06-29 DIAGNOSIS — Z171 Estrogen receptor negative status [ER-]: Secondary | ICD-10-CM

## 2018-06-29 DIAGNOSIS — D519 Vitamin B12 deficiency anemia, unspecified: Secondary | ICD-10-CM

## 2018-06-29 DIAGNOSIS — D649 Anemia, unspecified: Secondary | ICD-10-CM | POA: Diagnosis present

## 2018-06-30 ENCOUNTER — Other Ambulatory Visit: Admit: 2018-06-30 | Discharge: 2018-07-01 | Payer: MEDICARE

## 2018-06-30 ENCOUNTER — Ambulatory Visit: Admit: 2018-06-30 | Discharge: 2018-07-01 | Payer: MEDICARE

## 2018-06-30 DIAGNOSIS — D630 Anemia in neoplastic disease: Principal | ICD-10-CM

## 2018-06-30 DIAGNOSIS — C50412 Malignant neoplasm of upper-outer quadrant of left female breast: Secondary | ICD-10-CM

## 2018-06-30 DIAGNOSIS — C50912 Malignant neoplasm of unspecified site of left female breast: Principal | ICD-10-CM

## 2018-06-30 DIAGNOSIS — Z171 Estrogen receptor negative status [ER-]: Secondary | ICD-10-CM

## 2018-07-02 ENCOUNTER — Ambulatory Visit: Admit: 2018-07-02 | Discharge: 2018-07-31 | Payer: MEDICARE

## 2018-07-02 ENCOUNTER — Ambulatory Visit
Admit: 2018-07-02 | Discharge: 2018-07-31 | Payer: MEDICARE | Attending: Radiation Oncology | Primary: Radiation Oncology

## 2018-07-02 DIAGNOSIS — C50412 Malignant neoplasm of upper-outer quadrant of left female breast: Principal | ICD-10-CM

## 2018-07-13 ENCOUNTER — Other Ambulatory Visit: Admit: 2018-07-13 | Discharge: 2018-07-14 | Payer: MEDICARE

## 2018-07-13 ENCOUNTER — Ambulatory Visit: Admit: 2018-07-13 | Discharge: 2018-07-14 | Payer: MEDICARE

## 2018-07-13 ENCOUNTER — Ambulatory Visit
Admit: 2018-07-13 | Discharge: 2018-07-14 | Payer: MEDICARE | Attending: Hematology & Oncology | Primary: Hematology & Oncology

## 2018-07-13 DIAGNOSIS — C50912 Malignant neoplasm of unspecified site of left female breast: Principal | ICD-10-CM

## 2018-07-13 DIAGNOSIS — Z171 Estrogen receptor negative status [ER-]: Secondary | ICD-10-CM

## 2018-07-13 DIAGNOSIS — C50412 Malignant neoplasm of upper-outer quadrant of left female breast: Secondary | ICD-10-CM

## 2018-07-21 ENCOUNTER — Ambulatory Visit: Admit: 2018-07-21 | Discharge: 2018-07-21 | Disposition: A | Discharge status: Home / Self Care | Payer: MEDICARE

## 2018-07-21 DIAGNOSIS — Z171 Estrogen receptor negative status [ER-]: Secondary | ICD-10-CM

## 2018-07-21 DIAGNOSIS — C50412 Malignant neoplasm of upper-outer quadrant of left female breast: Principal | ICD-10-CM

## 2018-07-21 DIAGNOSIS — R509 Fever, unspecified: Secondary | ICD-10-CM

## 2018-07-22 MED ORDER — LEVETIRACETAM 500 MG TABLET
ORAL_TABLET | 4 refills | 0 days | Status: CP
Start: 2018-07-22 — End: 2018-10-12

## 2018-07-26 DIAGNOSIS — C50412 Malignant neoplasm of upper-outer quadrant of left female breast: Principal | ICD-10-CM

## 2018-07-26 DIAGNOSIS — Z171 Estrogen receptor negative status [ER-]: Secondary | ICD-10-CM

## 2018-08-02 ENCOUNTER — Ambulatory Visit: Admit: 2018-08-02 | Discharge: 2018-08-31 | Payer: MEDICARE

## 2018-08-02 ENCOUNTER — Ambulatory Visit
Admit: 2018-08-02 | Discharge: 2018-08-31 | Payer: MEDICARE | Attending: Radiation Oncology | Primary: Radiation Oncology

## 2018-08-02 ENCOUNTER — Encounter
Admit: 2018-08-02 | Discharge: 2018-08-31 | Payer: MEDICARE | Attending: Radiation Oncology | Primary: Radiation Oncology

## 2018-08-02 DIAGNOSIS — C50412 Malignant neoplasm of upper-outer quadrant of left female breast: Principal | ICD-10-CM

## 2018-08-10 ENCOUNTER — Other Ambulatory Visit: Admit: 2018-08-10 | Discharge: 2018-08-11 | Payer: MEDICARE

## 2018-08-10 ENCOUNTER — Ambulatory Visit
Admit: 2018-08-10 | Discharge: 2018-08-11 | Payer: MEDICARE | Attending: Hematology & Oncology | Primary: Hematology & Oncology

## 2018-08-10 DIAGNOSIS — Z171 Estrogen receptor negative status [ER-]: Secondary | ICD-10-CM

## 2018-08-10 DIAGNOSIS — C50412 Malignant neoplasm of upper-outer quadrant of left female breast: Principal | ICD-10-CM

## 2018-08-10 DIAGNOSIS — R197 Diarrhea, unspecified: Secondary | ICD-10-CM

## 2018-08-10 DIAGNOSIS — C50912 Malignant neoplasm of unspecified site of left female breast: Principal | ICD-10-CM

## 2018-08-11 DIAGNOSIS — C50412 Malignant neoplasm of upper-outer quadrant of left female breast: Principal | ICD-10-CM

## 2018-08-13 DIAGNOSIS — C50412 Malignant neoplasm of upper-outer quadrant of left female breast: Principal | ICD-10-CM

## 2018-08-16 DIAGNOSIS — Z170 Estrogen receptor positive status [ER+]: Secondary | ICD-10-CM

## 2018-08-16 DIAGNOSIS — C50912 Malignant neoplasm of unspecified site of left female breast: Principal | ICD-10-CM

## 2018-08-17 MED ORDER — MOMETASONE 0.1 % TOPICAL CREAM
1 refills | 0 days | Status: CP
Start: 2018-08-17 — End: 2019-08-17
  Filled 2018-08-18: qty 45, 30d supply, fill #0

## 2018-08-17 MED FILL — CLONAZEPAM 1 MG TABLET: ORAL | 30 days supply | Qty: 60 | Fill #3

## 2018-08-17 MED FILL — CLONAZEPAM 1 MG TABLET: 30 days supply | Qty: 60 | Fill #3 | Status: AC

## 2018-08-18 MED FILL — MOMETASONE 0.1 % TOPICAL CREAM: 30 days supply | Qty: 45 | Fill #0 | Status: AC

## 2018-08-31 MED ORDER — CLONAZEPAM 1 MG TABLET
ORAL_TABLET | Freq: Every day | ORAL | 5 refills | 0.00000 days | Status: CP
Start: 2018-08-31 — End: 2018-09-02

## 2018-09-02 ENCOUNTER — Ambulatory Visit: Admit: 2018-09-02 | Discharge: 2018-10-01 | Payer: MEDICARE

## 2018-09-02 ENCOUNTER — Encounter
Admit: 2018-09-02 | Discharge: 2018-10-01 | Payer: MEDICARE | Attending: Radiation Oncology | Primary: Radiation Oncology

## 2018-09-02 DIAGNOSIS — C50412 Malignant neoplasm of upper-outer quadrant of left female breast: Principal | ICD-10-CM

## 2018-09-02 MED ORDER — CLONAZEPAM 1 MG TABLET
ORAL_TABLET | Freq: Two times a day (BID) | ORAL | 3 refills | 0.00000 days | Status: CP
Start: 2018-09-02 — End: 2018-10-12
  Filled 2018-09-06: qty 60, 30d supply, fill #0

## 2018-09-06 DIAGNOSIS — C50412 Malignant neoplasm of upper-outer quadrant of left female breast: Principal | ICD-10-CM

## 2018-09-06 MED FILL — CLONAZEPAM 1 MG TABLET: 30 days supply | Qty: 60 | Fill #0 | Status: AC

## 2018-09-13 DIAGNOSIS — Z170 Estrogen receptor positive status [ER+]: Secondary | ICD-10-CM

## 2018-09-13 DIAGNOSIS — C50911 Malignant neoplasm of unspecified site of right female breast: Principal | ICD-10-CM

## 2018-09-24 DIAGNOSIS — C50412 Malignant neoplasm of upper-outer quadrant of left female breast: Principal | ICD-10-CM

## 2018-09-24 DIAGNOSIS — Z171 Estrogen receptor negative status [ER-]: Secondary | ICD-10-CM

## 2018-09-24 MED ORDER — SILVER SULFADIAZINE 1 % TOPICAL CREAM
1 refills | 0 days | Status: CP
Start: 2018-09-24 — End: 2019-09-24

## 2018-09-28 MED FILL — MOMETASONE 0.1 % TOPICAL CREAM: 30 days supply | Qty: 45 | Fill #1 | Status: AC

## 2018-09-28 MED FILL — MOMETASONE 0.1 % TOPICAL CREAM: 30 days supply | Qty: 45 | Fill #1

## 2018-10-06 MED FILL — CLONAZEPAM 1 MG TABLET: 30 days supply | Qty: 60 | Fill #1 | Status: AC

## 2018-10-06 MED FILL — CLONAZEPAM 1 MG TABLET: ORAL | 30 days supply | Qty: 60 | Fill #1

## 2018-10-12 ENCOUNTER — Ambulatory Visit: Admit: 2018-10-12 | Discharge: 2018-10-13 | Payer: MEDICARE

## 2018-10-12 DIAGNOSIS — G40119 Localization-related (focal) (partial) symptomatic epilepsy and epileptic syndromes with simple partial seizures, intractable, without status epilepticus: Principal | ICD-10-CM

## 2018-10-12 MED ORDER — CLONAZEPAM 1 MG TABLET
ORAL_TABLET | Freq: Two times a day (BID) | ORAL | 5 refills | 0 days | Status: CP
Start: 2018-10-12 — End: 2019-02-10
  Filled 2018-11-09: qty 60, 30d supply, fill #0

## 2018-10-12 MED ORDER — LEVETIRACETAM 500 MG TABLET
ORAL_TABLET | 4 refills | 0 days | Status: CP
Start: 2018-10-12 — End: 2019-02-10

## 2018-10-12 MED ORDER — GABAPENTIN 300 MG CAPSULE
ORAL_CAPSULE | ORAL | 3 refills | 0 days | Status: CP
Start: 2018-10-12 — End: 2019-02-10
  Filled 2018-10-19: qty 270, 90d supply, fill #0

## 2018-10-19 ENCOUNTER — Other Ambulatory Visit: Admit: 2018-10-19 | Discharge: 2018-10-20 | Payer: MEDICARE

## 2018-10-19 ENCOUNTER — Ambulatory Visit
Admit: 2018-10-19 | Discharge: 2018-10-20 | Payer: MEDICARE | Attending: Hematology & Oncology | Primary: Hematology & Oncology

## 2018-10-19 DIAGNOSIS — F17200 Nicotine dependence, unspecified, uncomplicated: Secondary | ICD-10-CM

## 2018-10-19 DIAGNOSIS — C50412 Malignant neoplasm of upper-outer quadrant of left female breast: Principal | ICD-10-CM

## 2018-10-19 DIAGNOSIS — Z171 Estrogen receptor negative status [ER-]: Secondary | ICD-10-CM

## 2018-10-19 MED FILL — GABAPENTIN 300 MG CAPSULE: 90 days supply | Qty: 270 | Fill #0 | Status: AC

## 2018-11-09 MED FILL — CLONAZEPAM 1 MG TABLET: 30 days supply | Qty: 60 | Fill #0 | Status: AC

## 2018-11-15 MED ORDER — ATORVASTATIN 40 MG TABLET
ORAL_TABLET | 0 refills | 0 days | Status: CP
Start: 2018-11-15 — End: 2019-01-05

## 2018-11-30 ENCOUNTER — Other Ambulatory Visit: Admit: 2018-11-30 | Discharge: 2018-11-30 | Payer: MEDICARE

## 2018-11-30 ENCOUNTER — Institutional Professional Consult (permissible substitution)
Admit: 2018-11-30 | Discharge: 2018-11-30 | Payer: MEDICARE | Attending: Hematology & Oncology | Primary: Hematology & Oncology

## 2018-11-30 ENCOUNTER — Ambulatory Visit: Admit: 2018-11-30 | Discharge: 2018-11-30 | Payer: MEDICARE | Attending: Family | Primary: Family

## 2018-11-30 DIAGNOSIS — C50412 Malignant neoplasm of upper-outer quadrant of left female breast: Principal | ICD-10-CM

## 2018-11-30 DIAGNOSIS — Z66 Do not resuscitate: Principal | ICD-10-CM

## 2018-11-30 DIAGNOSIS — Z171 Estrogen receptor negative status [ER-]: Principal | ICD-10-CM

## 2018-12-03 MED ORDER — CAPECITABINE 500 MG TABLET
ORAL_TABLET | 0 refills | 0 days | Status: CP
Start: 2018-12-03 — End: 2018-12-29
  Filled 2018-12-08: qty 56, 21d supply, fill #0

## 2018-12-06 NOTE — Unmapped (Signed)
[facility] Specialty Medication Referral: Financial Assistance Unavailable      Medication (Brand/Generic): Capecitabine    Final Test Claim completed with resulted information below:    Patient ABLE to fill at [facility]  Insurance Company: [facility]  Anticipated Copay: $60.14  Is anticipated copay with a copay card or grant? No, the patient does not qualify for a grant or copay assistance.     Does patient's insurance plan only allow a 15 day supply for the first 6 fills in the Split Fill Program? No  If yes, inform patient they can request to dis-enroll from the [facility] by calling the patient help desk at N/A.      If the copay is under the $25 defined limit, per policy there will be no further investigation of need for financial assistance at this time unless patient requests. This referral has been communicated to the provider and handed off to the [facility] team for further processing and filling of prescribed medication.   ______________________________________________________________________  Please utilize this referral for viewing purposes as it will serve as the central location for all relevant documentation and updates.

## 2018-12-08 MED FILL — CAPECITABINE 500 MG TABLET: 21 days supply | Qty: 56 | Fill #0 | Status: AC

## 2018-12-08 NOTE — Unmapped (Signed)
[facility]   Patient Onboarding/Medication Counseling    Sonya Williams is a 68 y.o. female with breast cancer who I am counseling today on initiation of therapy.  I am speaking to the patient.    Verified patient's date of birth / HIPAA.    Specialty medication(s) to be sent: Hematology/Oncology: Capecitabine 500mg       Non-specialty medications/supplies to be sent: n/a      Medications not needed at this time: n/a         Xeloda (capecitabine)    Medication & Administration     Dosage: 1000mg  (2 tablets) twice daily for 14 days on and 7 days off    Administration: I reviewed the importance of taking with a full glass of water within 30 minutes of a meal (at least 1 cup of food). Discussed that tablet should be swallowed whole and cannot be crushed or chewed.    Adherence/Missed dose instruction: If a dose is missed, do not take an extra dose or two doses at one time. Simply take your next dose at the regularly scheduled time and record any missed doses so our team is aware.    Goals of Therapy     Prevent disease progression    Side Effects & Monitoring Parameters   ? Nausea/vomiting  ? Diarrhea/constipation  ? Infection precautions  ? Fatigue  ? Mouth sores  ? Hand/foot syndrome (tingling, numbness, pain, redness, peeling or blistering)  ? Sun precautions  ? Decrease appetite/ taste changes  ? Bleeding precautions (bruising easily, nose bleeds, gums bleed)    The following side effects should be reported to the provider:  ? Heartbeat that doesn't feel normal (heart feels like it's racing, skipping a beat or fluttering)  ? Decrease in urination, change in color of urine, blood in urine  ? Dark urine  or yellow skin or eyes  ? Diarrhea not controlled by anti-diarrheals  ? Mouth irritation or mouth sores  ? Signs of allergic reaction (rash, hives, shortness of breath)  ? Redness or irritation on the palms of the hands or soles of the feet  ? Swelling, warmth, numbness, change of color or pain in a leg or arm  ? Signs of infection (fever >100.5, chills, sore throat)    Warnings & Precautions     ? Mouth sores-discussed use of baking soda/salt water rinses  ? Hand/foot prevention (moisturizers, luke warm hand washes, patting hands/feet dry, decrease rubbing on hands/feet)  ? Importance of hydration if having frequent diarrhea  ? Importance of good nutrition to help minimize diarrhea (high protein, BRAT, yogurt, avoid greasy and spicy foods)  ? Avoid live vaccines  ? Exposure of an unborn child to this medication could cause birth defects so you should not become pregnant or father a child while on this medication.    Drug/Food Interactions     ? Medication list reviewed in Epic. The patient was instructed to inform the care team before taking any new medications or supplements. No drug interactions identified. Avoid live vaccines.    Storage, Handling Precautions, & Disposal     ? This medication should be stored at room temperature and in a dry location. Keep out of reach of others including children and pets. Keep the medicine in the original container with a child-proof top (no pillboxes). Do not throw away or flush unused medication down the toilet or sink. This drug is considered hazardous and should be handled as little as possible.  If  someone else helps with medication administration, they should wear gloves.      Current Medications (including OTC/herbals), Comorbidities and Allergies     Current Outpatient Medications   Medication Sig Dispense Refill   ??? acetaminophen (TYLENOL) 500 MG tablet Take 500 mg by mouth every four (4) hours as needed for pain.     ??? amLODIPine (NORVASC) 10 MG tablet Take 1 tablet (10 mg total) by mouth daily. 90 tablet 1   ??? aspirin 81 MG chewable tablet Chew 81 mg once daily.      ??? atorvastatin (LIPITOR) 40 MG tablet TAKE 1 TABLET EVERY DAY 90 tablet 0   ??? betamethasone dipropionate (DIPROLENE) 0.05 % ointment Apply to rash twice a day 90 g 3   ??? capecitabine (XELODA) 500 MG tablet TAKE 2 TABLETS (1000mg ) BY MOUTH TWICE DAILY FOR 14 DAYS ON 7 DAYS OFF 56 tablet 0   ??? carvedilol (COREG) 25 MG tablet TAKE 1 TABLET TWICE DAILY 180 tablet 3   ??? cholecalciferol, vitamin D3, (CHOLECALCIFEROL) 1,000 unit tablet Take 1,000 Units by mouth once daily.      ??? clonazePAM (KLONOPIN) 1 MG tablet Take 1 tablet (1 mg total) by mouth Two (2) times a day. 60 tablet 5   ??? furosemide (LASIX) 20 MG tablet Take 1 tablet (20 mg total) by mouth daily as needed. 15 tablet 3   ??? gabapentin (NEURONTIN) 300 MG capsule TAKE 1 CAPSULE BY MOUTH 3 TIMES DAILY 270 capsule 3   ??? levETIRAcetam (KEPPRA) 500 MG tablet Take 2 tablets in the morning and 3 tabs at night. 450 tablet 4   ??? loperamide HCl (IMODIUM A-D ORAL) Take 1 tablet by mouth daily as needed.     ??? losartan (COZAAR) 100 MG tablet Take 1 tablet (100 mg total) by mouth daily. 90 tablet 3   ??? magnesium oxide (MAG-OX) 400 mg (241.3 mg magnesium) tablet Take 1 tablet (400 mg total) by mouth daily. (Patient taking differently: Take 500 mg by mouth daily. ) 1 tablet 0   ??? meTHIMazole (TAPAZOLE) 5 MG tablet Take 1 tablet (5 mg total) by mouth two (2) times a day. 180 tablet 1   ??? mometasone (ELOCON) 0.1 % cream Apply to left chest twice daily during radiation treatment 45 g 1   ??? ondansetron (ZOFRAN) 8 MG tablet Take 1 tablet (8 mg total) by mouth every eight (8) hours as needed for nausea. 30 tablet 2   ??? potassium chloride (KLOR-CON) 10 MEQ CR tablet Take 1 tablet (10 mEq total) by mouth daily. Only take this if you are taking the furosemide (lasix) 30 tablet 11   ??? prochlorperazine (COMPAZINE) 10 MG tablet Take 10 mg by mouth every six (6) hours as needed for nausea.     ??? silver sulfaDIAZINE (SILVADENE, SSD) 1 % cream Apply to affected area daily 50 g 1   ??? spironolactone (ALDACTONE) 25 MG tablet Take 1 tablet (25 mg total) by mouth daily. 90 tablet 3     No current facility-administered medications for this visit.        Allergies   Allergen Reactions ??? Iodinated Contrast Media Hives and Other (See Comments)     Rash    ??? Other Hives     Metal-reaction=rash       Patient Active Problem List   Diagnosis   ??? HTN   ??? Pure hypercholesterolemia (RAF-HCC)   ??? Vitamin D deficiency   ??? Tobacco use     ???  Goiter (RAF-HCC)   ??? Problem with medical care compliance   ??? Coronary Artery Disease s/p PCI to RCA   ??? Situational depression   ??? Heart failure (CMS-HCC)   ??? Focal epilepsy with impairment of consciousness, intractable (CMS-HCC)   ??? Malignant neoplasm of upper-outer quadrant of left breast in female, estrogen receptor negative (CMS-HCC)   ??? Invasive ductal carcinoma of breast, female, left (CMS-HCC)   ??? Falls frequently   ??? Diarrhea   ??? 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 Anemia   ??? Anemia in neoplastic disease       Reviewed and up to date in Epic.    Appropriateness of Therapy     Is medication and dose appropriate based on diagnosis? Yes    Baseline Quality of Life Assessment      How many days over the past month did your condtion keep you from your normal activities? 0    Financial Information     Medication Assistance provided: Prior Authorization    Anticipated copay of $60.14 reviewed with patient. Verified delivery address.    Delivery Information     Scheduled delivery date: 12/09/18    Expected start date: 12/13/18    Medication will be delivered via Next Day Courier to the home address in [place].  This shipment will not require a signature.      Explained the services we provide at [facility] and that each month we would call to set up refills.  Stressed importance of returning phone calls so that we could ensure they receive their medications in time each month.  Informed patient that we should be setting up refills 7-10 days prior to when they will run out of medication.  A pharmacist will reach out to perform a clinical assessment periodically.  Informed patient that a welcome packet and a drug information handout will be sent.      Patient verbalized understanding of the above information as well as how to contact the pharmacy at [phone] option 4 with any questions/concerns.  The pharmacy is open Monday through Friday 8:30am-4:30pm.  A pharmacist is available 24/7 via pager to answer any clinical questions they may have.    Patient Specific Needs     ? Does the patient have any physical, cognitive, or cultural barriers? No    ? Patient prefers to have medications discussed with  Patient     ? Is the patient able to read and understand education materials at a high school level or above? No    ? Patient's primary language is  English     ? Is the patient high risk? No     ? Does the patient require a Care Management Plan? No     ? Does the patient require physician intervention or other additional services (i.e. nutrition, smoking cessation, social work)? No      Florene Route [facility] Specialty Pharmacist

## 2018-12-09 NOTE — Unmapped (Signed)
Attempted to contact Sonya Williams, 68 y.o. by phone to provide treatment follow-up for tobacco use/dependence.      12/09/18 1218   Tobacco Use Treatment   Program [facility]   Type of Visit Attempt   Tobacco Use Treatment Visit Patient not available  (LVM)   Tobacco Use Assessment (Past 30 days)   Type of Tobacco Products Used Cigarettes   TTS Information   TTS Visit Length Less than 3 minutes       Lesle Chris  Tobacco Treatment Specialist  [facility]  [phone]  pager: [phone]

## 2018-12-09 NOTE — Unmapped (Signed)
Received return call from [name], 68 y.o. for treatment follow-up for tobacco use/dependence.     SUMMARY: SW re-assessed pt's tobacco use status and followed up about treatment plan. Pt stated stated she had continued to make progress toward goals, and SW congratulated pt on this. Pt reported feeling better than when she last spoke to SW. Pt stated that engaging in more activities had helped her reduce smoking. In addition, pt states that chemotherapy caused her to not like the taste of cigarettes, which caused her to smoke less. SW and pt reviewed helpful strategies, and SW made plans for follow up.     12/09/18 1333   Tobacco Use Treatment   Program [facility]   Type of Visit Follow-up   Session Number 2   Tobacco Use Treatment Visit Talked with patient   Permission To Engage In Conversation Re: PHI w/ Visitors Present n/a   Goals Of Session Insight, increase;Assessment;Risk behaviors, modulate;Behavior management, improve   Cancer Center Patients   Primary Service Hem Onc   Primary Cancer Type Breast   Tobacco Use Assessment (Past 30 days)   Time Since Last Tobacco Use smoked a cigarette today (at least one puff)   Type of Tobacco Products Used Cigarettes   Quantity Used 3   Quantity Per day   Relight Cigarettes No  (Pt no longer relighting because cigarettes are too strong)   Tobacco Use History   Most Recent Attempt Currently working on   Behavioral Assessment   Why Uses 1. Habit 2. Stress/anxiety   Reasons to Become Tobacco Free 1. Pt has had 2 heart attacks. 2. Pt has cancer and is undergoing treatment   Barriers/Challenges None identified at this time   Strategies 1. Faith/spirituality. Pt reports this has helped her with cancer treatment 2. Pt has a dog she keeps outside who talks to her 3. Pt has been cleaning her house 4. Pt loves to reach, especially Eliezer Lofts and Esperanza Sheets books   Treatment Plan   Cessation Meds Currently Using None   Outpatient/Discharge Medications Recommended Patient declines  (Continues to be ambivalent about meds. May discuss with MD)   Patient's Plan Post Discharge/Visit Reduce tobacco use   Follow-up Plan Permission given;Phone follow-up scheduled   Family Members Included in Intervention/Plan No   TTS Information   Diagnosis Tobacco use disorder, unspecified, uncomplicated (F17.200)   Interventions Assessed;Behavioral techniques;Discussed;Motivational interviewing;Suggested;Tx plan development;Encouraged   TTS Visit Length Psychotherapy >38 min   Follow-up Data 1 month FU     Lesle Chris  Tobacco Treatment Specialist  [facility]  [phone]  pager: [phone]

## 2018-12-15 MED FILL — CLONAZEPAM 1 MG TABLET: ORAL | 30 days supply | Qty: 60 | Fill #1

## 2018-12-15 MED FILL — CLONAZEPAM 1 MG TABLET: 30 days supply | Qty: 60 | Fill #1 | Status: AC

## 2018-12-22 NOTE — Unmapped (Signed)
Patient called the nurse line and stated,     I have had real bad diarrhea for quite some time.  I've talked to Dr. Graciela Husbands about this before, and he told me to take immodium.  I've been taking 6 immodium every day and it's not helping.  I was supposed to see Dr. Graciela Husbands last week, but it was cancelled.    Advised patient to schedule Telemedicine phone visit with Dr. Graciela Husbands and transferred to [facility] for scheduling.

## 2018-12-22 NOTE — Unmapped (Signed)
Assessment:        1. Diarrhea, unspecified type    2. Vitamin D deficiency    3. Graves disease    4. Osteoporosis, unspecified osteoporosis type, unspecified pathological fracture presence            Plan:        Patient instructions:  STOP Magnesium.    Get blood tests and give stool samples for the lab to analyze.     See me in the next 1-2 weeks for evaluation.     Barriers to goals identified and addressed. Pertinent handouts were given today and reviewed with the patient as indicated.  The Care Plan and Self-Management goals have been included on the AVS and the AVS has been printed. Any outside resources or referrals needed at this time are noted above.  Any new medications prescribed have been discussed, and side effects have been addressed. Have assessed the patient's understanding, response, and barriers to adherence to medications. Patient voiced understanding and all questions have been answered to satisfaction.        Subjective:     Sonya Williams is a 68 y.o. female.    I spent 12 minutes on the phone with the patient. I spent an additional 4 minutes on pre- and post-visit activities.     The patient was physically located in [place] or a state in which I am permitted to provide care. The patient understood that s/he may incur co-pays and cost sharing, and agreed to the telemedicine visit. The visit was completed via phone and/or video, which was appropriate and reasonable under the circumstances given the patient's presentation at the time.    The patient has been advised of the potential risks and limitations of this mode of treatment (including, but not limited to, the absence of in-person examination) and has agreed to be treated using telemedicine. The patient's/patient's family's questions regarding telemedicine have been answered.     If the phone/video visit was completed in an ambulatory setting, the patient has also been advised to contact their provider???s office for worsening conditions, and seek emergency medical treatment and/or call 911 if the patient deems either necessary.      Patient stated that no one else would be present during the phone visit.  If someone will be present patient stated they would be OK with them hearing the conversation.    HPI:  This visit is for: Many months of diarrhea in patient with Graves disease and Breast cancer s/p radiation and chemo therapy, and a seizure disorder.  She has been having 8-12 episodes of diarrhea oer day and using modium up to 6 /day.  Nof/c.  No abd pain, no blood. No recent antibiotics.  No diarrhea when on radiation several months ago, it seemed to have started after radiation. She continues on Methimazole as prescribed.  She has dry not oily hair and skin.  No palpitations or sweating. No anxiety.  She does take Mg Oxide for hypomagnesemia but last Mg of 2.1 was normal in 06/2018. No sick contacts. Of note her labs from 11/30/2018 were normal without evidence of electrolyte disturbance or leukocytosis.     She has known osteoporosis on Vit D w/ calcium Last Vit D level of 18 was low.     The following portions of the patient's history were reviewed and updated as appropriate: allergies, current medications, past family history, past medical history, past social history, past surgical history and problem list.  Review of Systems  Pertinent items are noted in HPI.     Little interest or pleasure in doing things: Not at all    Feeling down, depressed, or hopeless; (age 23-17) Feeling down, depressed, irritable, or hopeless: Not at all    Trouble falling or staying asleep, or sleeping too much: More than half the days    Feeling tired or having little energy: Not at all    Poor appetite or overeating; (age 30-17) Poor appetite, weight loss or overeating: Nearly every day   Feeling bad about yourself - or that you are a failure or have let yourself or your family down; (age 38-17) Feeling bad about yourself - or feeling that you are a failure, or that you have let yourself or your family down: Not at all   Trouble concentrating on things, such as reading the newspaper or watching television; (age 72-17) Trouble concentrating on things like schoolwork, reading or watching TV: Not at all   Moving or speaking so slowly that other people could have noticed. Or the opposite - being so fidgety or restless that you have been moving around a lot more than usual: Not at all      Thoughts that you would be better off dead, or of hurting yourself in some way: Not at all   PHQ-9 TOTAL SCORE: 5       PHQ-9 Total Score Depression Severity:: Mild  How difficult have these problems made it to do work, take care of things at home, or get along with other people?  Not difficult at all         Objective:      Physical Exam:    Wt Readings from Last 6 Encounters:   10/19/18 48.2 kg (106 lb 4.8 oz)   10/12/18 46.7 kg (102 lb 14.4 oz)   09/06/18 47.9 kg (105 lb 8 oz)   08/30/18 48.3 kg (106 lb 8 oz)   08/16/18 45.8 kg (101 lb)   08/10/18 44.8 kg (98 lb 12.8 oz)       Laboratory Values:  Results for orders placed or performed in visit on 11/30/18   Comprehensive Metabolic Panel   Result Value Ref Range    Sodium 140 135 - 145 mmol/L    Potassium 3.9 3.5 - 5.0 mmol/L    Chloride 104 98 - 107 mmol/L    Anion Gap 14 7 - 15 mmol/L    CO2 22.0 22.0 - 30.0 mmol/L    BUN 16 7 - 21 mg/dL    Creatinine 1.61 0.96 - 1.00 mg/dL    BUN/Creatinine Ratio 16     EGFR CKD-EPI Non-African American, Female 60 >=60 mL/min/1.23m2    EGFR CKD-EPI African American, Female 82 >=60 mL/min/1.42m2    Glucose 83 70 - 179 mg/dL    Calcium 9.4 8.5 - 04.5 mg/dL    Albumin 4.2 3.5 - 5.0 g/dL    Total Protein 7.8 6.5 - 8.3 g/dL    Total Bilirubin 0.5 0.0 - 1.2 mg/dL    AST 18 14 - 38 U/L    ALT 8 <35 U/L    Alkaline Phosphatase 78 38 - 126 U/L   CBC w/ Differential   Result Value Ref Range    WBC 4.0 (L) 4.5 - 11.0 10*9/L    RBC 3.51 (L) 4.00 - 5.20 10*12/L    HGB 11.4 (L) 12.0 - 16.0 g/dL    HCT 40.9 (L) 81.1 - 46.0 %    MCV  98.7 80.0 - 100.0 fL    MCH 32.5 26.0 - 34.0 pg    MCHC 32.9 31.0 - 37.0 g/dL    RDW 87.5 (H) 64.3 - 15.0 %    MPV 7.9 7.0 - 10.0 fL    Platelet 161 150 - 440 10*9/L    Neutrophils % 59.9 %    Lymphocytes % 19.3 %    Monocytes % 8.0 %    Eosinophils % 9.8 %    Basophils % 0.7 %    Absolute Neutrophils 2.4 2.0 - 7.5 10*9/L    Absolute Lymphocytes 0.8 (L) 1.5 - 5.0 10*9/L    Absolute Monocytes 0.3 0.2 - 0.8 10*9/L    Absolute Eosinophils 0.4 0.0 - 0.4 10*9/L    Absolute Basophils 0.0 0.0 - 0.1 10*9/L    Large Unstained Cells 2 0 - 4 %    Macrocytosis Slight (A) Not Present

## 2018-12-23 NOTE — Unmapped (Signed)
Patient scheduled for 12/24/2018.

## 2018-12-24 ENCOUNTER — Institutional Professional Consult (permissible substitution): Admit: 2018-12-24 | Discharge: 2018-12-25 | Payer: MEDICARE | Attending: Family Medicine | Primary: Family Medicine

## 2018-12-24 DIAGNOSIS — E559 Vitamin D deficiency, unspecified: Secondary | ICD-10-CM

## 2018-12-24 DIAGNOSIS — M810 Age-related osteoporosis without current pathological fracture: Secondary | ICD-10-CM

## 2018-12-24 DIAGNOSIS — R197 Diarrhea, unspecified: Principal | ICD-10-CM

## 2018-12-24 DIAGNOSIS — E0500 Thyrotoxicosis with diffuse goiter without thyrotoxic crisis or storm: Secondary | ICD-10-CM

## 2018-12-24 NOTE — Unmapped (Signed)
Refilled: 05/10/2018    Last OV: 12/24/2018    Future OV: 12/31/2018        Pt called nurse line and is out of medication. Ordered a 30 day supply to local pharmacy and a 90 day supply to mail order. All you need to do is sign the order.

## 2018-12-24 NOTE — Unmapped (Addendum)
STOP Magnesium.    Get blood tests and give stool samples for the lab to analyze.     See me in the next 1-2 weeks for examination.

## 2018-12-27 ENCOUNTER — Ambulatory Visit: Admit: 2018-12-27 | Discharge: 2018-12-28 | Payer: MEDICARE

## 2018-12-27 DIAGNOSIS — R197 Diarrhea, unspecified: Principal | ICD-10-CM

## 2018-12-27 DIAGNOSIS — E0500 Thyrotoxicosis with diffuse goiter without thyrotoxic crisis or storm: Secondary | ICD-10-CM

## 2018-12-27 DIAGNOSIS — E559 Vitamin D deficiency, unspecified: Secondary | ICD-10-CM

## 2018-12-27 LAB — BASIC METABOLIC PANEL
ANION GAP: 9 mmol/L (ref 7–15)
BLOOD UREA NITROGEN: 15 mg/dL (ref 7–21)
BUN / CREAT RATIO: 15
CALCIUM: 9.2 mg/dL (ref 8.5–10.2)
CO2: 25 mmol/L (ref 22.0–30.0)
CREATININE: 0.98 mg/dL (ref 0.60–1.00)
GLUCOSE RANDOM: 87 mg/dL (ref 70–179)
POTASSIUM: 4 mmol/L (ref 3.5–5.0)
SODIUM: 141 mmol/L (ref 135–145)

## 2018-12-27 LAB — T4, FREE: FREE T4: 1.28 ng/dL (ref 0.71–1.40)

## 2018-12-27 LAB — TSH: THYROID STIMULATING HORMONE: 0.469 uIU/mL — ABNORMAL LOW (ref 0.600–3.300)

## 2018-12-27 MED ORDER — METHIMAZOLE 5 MG TABLET: 5 mg | tablet | Freq: Two times a day (BID) | 0 refills | 0 days | Status: AC

## 2018-12-27 MED ORDER — METHIMAZOLE 5 MG TABLET
ORAL_TABLET | Freq: Two times a day (BID) | ORAL | 0 refills | 0.00000 days | Status: CP
Start: 2018-12-27 — End: 2019-01-03

## 2018-12-27 NOTE — Unmapped (Signed)
[facility] Clinical Assessment & Refill Coordination Note    [name], DOB: 17-Jun-1951  Phone: [phone] (home)     All above HIPAA information was verified with patient.     Specialty Medication(s):   Hematology/Oncology: Capecitabine 500mg      Current Outpatient Medications   Medication Sig Dispense Refill   ??? acetaminophen (TYLENOL) 500 MG tablet Take 500 mg by mouth every four (4) hours as needed for pain.     ??? amLODIPine (NORVASC) 10 MG tablet Take 1 tablet (10 mg total) by mouth daily. 90 tablet 1   ??? aspirin 81 MG chewable tablet Chew 81 mg once daily.      ??? atorvastatin (LIPITOR) 40 MG tablet TAKE 1 TABLET EVERY DAY 90 tablet 0   ??? betamethasone dipropionate (DIPROLENE) 0.05 % ointment Apply to rash twice a day 90 g 3   ??? capecitabine (XELODA) 500 MG tablet TAKE 2 TABLETS (1000mg ) BY MOUTH TWICE DAILY FOR 14 DAYS ON 7 DAYS OFF 56 tablet 0   ??? carvedilol (COREG) 25 MG tablet TAKE 1 TABLET TWICE DAILY 180 tablet 3   ??? cholecalciferol, vitamin D3, (CHOLECALCIFEROL) 1,000 unit tablet Take 1,000 Units by mouth once daily.      ??? clonazePAM (KLONOPIN) 1 MG tablet Take 1 tablet (1 mg total) by mouth Two (2) times a day. 60 tablet 5   ??? furosemide (LASIX) 20 MG tablet Take 1 tablet (20 mg total) by mouth daily as needed. 15 tablet 3   ??? gabapentin (NEURONTIN) 300 MG capsule TAKE 1 CAPSULE BY MOUTH 3 TIMES DAILY 270 capsule 3   ??? levETIRAcetam (KEPPRA) 500 MG tablet Take 2 tablets in the morning and 3 tabs at night. 450 tablet 4   ??? loperamide HCl (IMODIUM A-D ORAL) Take 6 tablets by mouth daily as needed.      ??? losartan (COZAAR) 100 MG tablet Take 1 tablet (100 mg total) by mouth daily. 90 tablet 3   ??? meTHIMazole (TAPAZOLE) 5 MG tablet Take 1 tablet (5 mg total) by mouth two (2) times a day. 60 tablet 0   ??? meTHIMazole (TAPAZOLE) 5 MG tablet Take 1 tablet (5 mg total) by mouth two (2) times a day. 180 tablet 1   ??? mometasone (ELOCON) 0.1 % cream Apply to left chest twice daily during radiation treatment 45 g 1   ??? ondansetron (ZOFRAN) 8 MG tablet Take 1 tablet (8 mg total) by mouth every eight (8) hours as needed for nausea. 30 tablet 2   ??? potassium chloride (KLOR-CON) 10 MEQ CR tablet Take 1 tablet (10 mEq total) by mouth daily. Only take this if you are taking the furosemide (lasix) 30 tablet 11   ??? prochlorperazine (COMPAZINE) 10 MG tablet Take 10 mg by mouth every six (6) hours as needed for nausea.     ??? silver sulfaDIAZINE (SILVADENE, SSD) 1 % cream Apply to affected area daily 50 g 1   ??? spironolactone (ALDACTONE) 25 MG tablet Take 1 tablet (25 mg total) by mouth daily. 90 tablet 3     No current facility-administered medications for this visit.         Changes to medications: Rennie reports no changes at this time.    Allergies   Allergen Reactions   ??? Iodinated Contrast Media Hives and Other (See Comments)     Rash    ??? Other Hives     Metal-reaction=rash       Changes  to allergies: No    SPECIALTY MEDICATION ADHERENCE     Capecitabine 500 mg: 0 days of medicine on hand (starts next cycle 01/03/19)      Medication Adherence    Patient reported X missed doses in the last month:  0  Specialty Medication:  Capecitabine          Specialty medication(s) dose(s) confirmed: Regimen is correct and unchanged.     Are there any concerns with adherence? No    Adherence counseling provided? Not needed    CLINICAL MANAGEMENT AND INTERVENTION      Clinical Benefit Assessment:    Do you feel the medicine is effective or helping your condition? No    Clinical Benefit counseling provided? Not needed    Adverse Effects Assessment:    Are you experiencing any side effects? diarrhea    Are you experiencing difficulty administering your medicine? No    Quality of Life Assessment:    How many days over the past month did your conditon/medication  keep you from your normal activities? For example, brushing your teeth or getting up in the morning. 0    Have you discussed this with your provider? Not needed    Therapy Appropriateness:    Is therapy appropriate? Yes, therapy is appropriate and should be continued    DISEASE/MEDICATION-SPECIFIC INFORMATION      N/A    PATIENT SPECIFIC NEEDS     ? Does the patient have any physical, cognitive, or cultural barriers? No    ? Is the patient high risk? No     ? Does the patient require a Care Management Plan? No     ? Does the patient require physician intervention or other additional services (i.e. nutrition, smoking cessation, social work)? No      SHIPPING     Specialty Medication(s) to be Shipped:   Hematology/Oncology: Capecitabine 500mg     Other medication(s) to be shipped: n/a     Changes to insurance: No    Delivery Scheduled: Yes, Expected medication delivery date: 12/29/28.  However, Rx request for refills was sent to the provider as there are none remaining.     Medication will be delivered via Next Day Courier to the confirmed home address in [facility].    The patient will receive a drug information handout for each medication shipped and additional FDA Medication Guides as required.  Verified that patient has previously received a Conservation officer, historic buildings.    Rollen Sox   [facility] Specialty Pharmacist

## 2018-12-28 ENCOUNTER — Ambulatory Visit: Admit: 2018-12-28 | Discharge: 2018-12-29 | Payer: MEDICARE

## 2018-12-28 DIAGNOSIS — R197 Diarrhea, unspecified: Principal | ICD-10-CM

## 2018-12-28 NOTE — Unmapped (Signed)
Radiation Oncology Follow Up Note    Telemedicine Visit  This patient visit was completed through the use of an audio/video or telephone encounter.      This patient encounter is appropriate and reasonable under the circumstances given the patient's particular presentation at this time. The patient has been advised of the potential risks and limitations of this mode of treatment (including, but not limited to, the absence of in-person examination) and has agreed to be treated in a remote fashion in spite of them. Any and all of the patient's/patient's family's questions on this issue have been answered.     The patient has also been advised to contact this office for worsening conditions or problems, and seek emergency medical treatment and/or call 911 if the patient deems either necessary.    Patient identity verbally confirmed: Yes.  Verbal consent for telemedicine encounter: Yes.  Location of patient [place], [place]): [place], [place]  Location of provider [place], State[place], [place]  Encounter medium: audio  Software platform: Telephone  Attendees: Dr. Chales Abrahams, Dr. Thelma Barge, Ms. Acton  Total time: 15 minutes  West Bali, MD  December 29, 2018   10:53 AM      Patient: Sonya Williams  VISIT DATE: 12/29/2018  MRN: [id number]   Diagnosis:  Breast Cancer    Interval since completion of RT: 3 months  Treatment received: 50Gy to chest wall and regional nodes, with 10Gy scar boost    ASSESSMENT AND PLAN:   Sonya Williams is a 68 y.o. 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 She completed radiation to the left chest wall and regional nodes (50Gy), with scar boost (10Gy) on 09/27/2018.      -Breast cancer: She just started systemic therapy, currently NED   Next mammogram: 3 months, right breast   Systemic therapy: Xeloda for 1 year, started on 12/27/2018  -Symptoms management: Doing well overall, no symptoms to manage at this time -Follow-up:    Surgical Oncology: None scheduled at this time, will set up 3 month follow up with Dr. Tama Gander with same day mammo   Medical Oncology: [name], NP on 01/04/19   Radiation Oncology: 6 months with Dr. Chales Abrahams  -------------------------------------------    Subjective/Interval History:   [name] returns for a regularly scheduled follow up. She was last evaluated on 09/27/2018 during treatment.    Today she reports doing well. She states she had some peeling of skin following completion of RT which has resolved. She states the skin in the treatment field is darker than her baseline skin tone. She denies chest wall pain, left arm swelling. She reports good shoulder mobility. She recently started Xeloda and is tolerating it well.       Malignant neoplasm of upper-outer quadrant of left breast in female, estrogen receptor negative (CMS-HCC)    10/2017 -  Presenting Symptoms     Patient self-palpated left breast mass prompting Dx imaging.      10/28/2017 Interval Scan(s)     B/L Dx MMG: Left breast ~3.1 x 2.8 cm mass w/calcs UOQ. Grouped coarse heterogeneous calcifications in the upper outer quadrant of the right breast measuring 0.6 cm.   [place]: Left breast 2:00 position, 6CFN mass 2.8 x 1.3 x 2.6 cm with internal calcifications. There is an adjacent ill-defined hypoechoic area next to the primary mass measuring approximately 2.3 x 0.7 cm which may represent an  extension of the mass versus satellite lesion.    [place]: Left axilla abnormal lymph node or mass measuring 2.0 x 0.9 x 1.2 cm. Multiple other normal-appearing lymph nodes are demonstrated.    Targeted diagnostic ultrasound of the right breast in the upper-outer quadrant performed to further evaluate calcifications demonstrated on the mammogram due to the patient's difficulty with tolerating a possible stereotactic guided biopsy. The calcifications were not demonstrated sonographically. Normal breast parenchyma was demonstrated.      11/03/2017 Biopsy     Rt breast SBx: fibroadenoma w/calcs  Lt breast CBx: IDC, G2, microcalcs a/w carcinoma  Lt axillary LN: + metastatic adenoCA    Receptors: ER- PR- Her2 Eq 2+ FISH NON-amplified.        11/20/2017 Interval Scan(s)     PET  --Intensely FDG avid left breast invasive ductal carcinoma with intensely avid left axillary biopsy proven nodal metastasis.  -- Moderately avid right breast mass is superior/anterior to the right breast clip, likely small hemorrhage and inflammation given recent stereotactic biopsy. Mild right axillary uptake is likely reactive.   -- No evidence of distant metastatic disease.  -- Enlarged, heterogeneous thyroid without abnormal FDG uptake. Findings compatible with multinodular goiter, as was described on thyroid ultrasound 05/01/2010.       Other     Carbo/Taxol  - C1/12 12/21/17. Carbo AUC 1, paclitaxel 80/m2.  Breast mass responding well to treatment.  - C3/12: 01/04/18 Carbo escalated to 1.5 AUC, paclitaxel 80 mg/m?? some decrease in hemoglobin.    - C4/12:  In view of ANC 1.2 decrease carbo back to AUC 1.0 this week and next  - C6/12: 01/27/18 HOLD Carbo due to Cr 1.75, only paclitaxel 80 mg/m2 given  - C7/12: 02/03/18 HOLD carbo and paclitaxel due to ANC 900. At this point, doubt that she will tolerate additional chemotherapy given her multiple medical co-morbidities      12/01/2017 - 01/31/2018 Chemotherapy     Chemotherapy Treatment    Treatment Goal Curative   Line of Treatment [No plan line of treatment]   Plan Name OP BREAST PACLItaxel WEEKLY / CARBOplatin EVERY 3 WEEKS   Start Date 12/21/2017   End Date 01/27/2018   Provider Fayne Norrie Muss, MD   Chemotherapy dexamethasone (DECADRON) tablet 20 mg, 20 mg, Oral, Once, 2 of 4 cycles  Administration: 20 mg (12/21/2017), 20 mg (12/28/2017), 20 mg (01/04/2018), 20 mg (01/11/2018), 20 mg (01/18/2018), 20 mg (01/27/2018)  CARBOplatin (PARAPLATIN) 117.6 mg in sodium chloride (NS) 0.9 % 100 mL IVPB, 117.6 mg (92.7 % of original dose 126.9 mg), Intravenous, Once, 2 of 4 cycles  Dose modification:   (original dose 126.9 mg, Cycle 1)  Administration: 74.8 mg (12/21/2017), 89.1 mg (12/28/2017), 123 mg (01/04/2018), 85.3 mg (01/11/2018), 76.8 mg (01/18/2018)  PACLItaxel (TAXOL) 126.42 mg in sodium chloride 0.9% NON-PVC (NS) 0.9 % 250 mL IVPB, 80 mg/m2 = 126.42 mg, Intravenous, Once, 2 of 4 cycles  Administration: 126.42 mg (12/21/2017), 126.42 mg (12/28/2017), 126.42 mg (01/04/2018), 126.42 mg (01/11/2018), 126.42 mg (01/18/2018), 126.42 mg (01/27/2018)         03/16/2018 Surgery     Left mastectomy+ALND: IDC, T=25mm, G3, Probable to Definite response to NACT. 1/18 (macromets=32mm) RCB: II-3.235      03/16/2018 -  Cancer Staged     Staging form: Breast, AJCC 8th Edition  - Pathologic stage from 03/16/2018: No Stage Recommended (ypT1c, pN1a, cM0, G3, ER-, PR-, HER2-) - Signed by Marcy Panning, FNP on 06/02/2018  06/02/2018 - 07/13/2018 Chemotherapy     OP BREAST AC (DOSE DENSE) x 4 cycles  DOXOrubicin 60 mg/m2, Cyclophosphamide 600 mg/m2 every 14 days        08/11/2018 - 09/27/2018 Radiation     approximate date completions. Dr. Carles Collet HBO        Invasive ductal carcinoma of breast, female, left (CMS-HCC)    12/21/2017 - 01/27/2018 Chemotherapy     Taxol Carbo with major toxicity         PHYSICAL EXAMINATION:  Virtual visit, no exam    _____________________  Winferd Humphrey, MD, PhD  Radiation Oncology PGY-2  Pager: [phone]  12/29/2018  11:07 AM

## 2018-12-29 ENCOUNTER — Ambulatory Visit
Admit: 2018-12-29 | Discharge: 2018-12-30 | Payer: MEDICARE | Attending: Radiation Oncology | Primary: Radiation Oncology

## 2018-12-29 DIAGNOSIS — C50912 Malignant neoplasm of unspecified site of left female breast: Principal | ICD-10-CM

## 2018-12-29 DIAGNOSIS — Z171 Estrogen receptor negative status [ER-]: Secondary | ICD-10-CM

## 2018-12-29 DIAGNOSIS — C50412 Malignant neoplasm of upper-outer quadrant of left female breast: Secondary | ICD-10-CM

## 2018-12-29 MED ORDER — CAPECITABINE 500 MG TABLET
ORAL_TABLET | 1 refills | 0 days | Status: CP
Start: 2018-12-29 — End: 2019-02-16
  Filled 2018-12-30: qty 56, 21d supply, fill #0

## 2018-12-29 NOTE — Unmapped (Signed)
Pharmacist Oral Chemotherapy Follow-Up    Assessment and recommendations:  [name] has tolerated her first cycle of capecitabine with no noticeable adverse effects and without a worsening of her baseline diarrhea.     Follow-up:  5/4 Labs  5/5 Phone visit with Dr. Garlon Hatchet  ___________________________________________________________________________  HPI: [name] is a 68 y.o. female with triple negative breast cancer who I am following while on oral chemotherapy.    Oral Chemotherapy: Capecitabine 1000 mg po BID x 14 days then 7 days off  Start date: 12/13/18  Pharmacy:  [facility]   Cycle 2 start date: 01/03/19    Interim History: I called SonyaWilliams tor oral chemotherapy follow-up.  She started her first cycle of capecitabine on 4/13 and she is on day 17 of the 21-day cycle.  She notes that she is feeling fine and has not noticed any side effects since starting the medication.    Adherence: She denies any missed doses.  She is using a pillbox to keep track of her days on and off.      Side effects: She reports feeling fine and denies any N/V or mucositis.  Her diarrhea has not worsened since staring the medication.  I encouraged her to continue to apply lotion to her hands and feet to reduce her risk of HFS.    Drug Interactions: No new medications for assessment.    Medication Acquisition:  [name] is concerned about paying for future copays.  I will ask the MAP team to review if she is eligible for any open grants.    Approximate time spent with patient: 10 minutes    Current medications:   Current Outpatient Medications   Medication Sig Dispense Refill   ??? acetaminophen (TYLENOL) 500 MG tablet Take 500 mg by mouth every four (4) hours as needed for pain.     ??? amLODIPine (NORVASC) 10 MG tablet Take 1 tablet (10 mg total) by mouth daily. 90 tablet 1   ??? aspirin 81 MG chewable tablet Chew 81 mg once daily.      ??? atorvastatin (LIPITOR) 40 MG tablet TAKE 1 TABLET EVERY DAY 90 tablet 0   ??? betamethasone dipropionate (DIPROLENE) 0.05 % ointment Apply to rash twice a day 90 g 3   ??? capecitabine (XELODA) 500 MG tablet TAKE 2 TABLETS (1000mg ) BY MOUTH TWICE DAILY FOR 14 DAYS ON 7 DAYS OFF 56 tablet 0   ??? carvedilol (COREG) 25 MG tablet TAKE 1 TABLET TWICE DAILY 180 tablet 3   ??? cholecalciferol, vitamin D3, (CHOLECALCIFEROL) 1,000 unit tablet Take 1,000 Units by mouth once daily.      ??? clonazePAM (KLONOPIN) 1 MG tablet Take 1 tablet (1 mg total) by mouth Two (2) times a day. 60 tablet 5   ??? furosemide (LASIX) 20 MG tablet Take 1 tablet (20 mg total) by mouth daily as needed. 15 tablet 3   ??? gabapentin (NEURONTIN) 300 MG capsule TAKE 1 CAPSULE BY MOUTH 3 TIMES DAILY 270 capsule 3   ??? levETIRAcetam (KEPPRA) 500 MG tablet Take 2 tablets in the morning and 3 tabs at night. 450 tablet 4   ??? loperamide HCl (IMODIUM A-D ORAL) Take 6 tablets by mouth daily as needed.      ??? losartan (COZAAR) 100 MG tablet Take 1 tablet (100 mg total) by mouth daily. 90 tablet 3   ??? meTHIMazole (TAPAZOLE) 5 MG tablet Take 1 tablet (5 mg total) by mouth two (2) times a day. 60  tablet 0   ??? meTHIMazole (TAPAZOLE) 5 MG tablet Take 1 tablet (5 mg total) by mouth two (2) times a day. 180 tablet 1   ??? mometasone (ELOCON) 0.1 % cream Apply to left chest twice daily during radiation treatment 45 g 1   ??? ondansetron (ZOFRAN) 8 MG tablet Take 1 tablet (8 mg total) by mouth every eight (8) hours as needed for nausea. 30 tablet 2   ??? potassium chloride (KLOR-CON) 10 MEQ CR tablet Take 1 tablet (10 mEq total) by mouth daily. Only take this if you are taking the furosemide (lasix) 30 tablet 11   ??? prochlorperazine (COMPAZINE) 10 MG tablet Take 10 mg by mouth every six (6) hours as needed for nausea.     ??? silver sulfaDIAZINE (SILVADENE, SSD) 1 % cream Apply to affected area daily 50 g 1   ??? spironolactone (ALDACTONE) 25 MG tablet Take 1 tablet (25 mg total) by mouth daily. 90 tablet 3     No current facility-administered medications for this visit.      Pertinent Labs:   No visits with results within 1 Day(s) from this visit.   Latest known visit with results is:   Appointment on 12/28/2018   Component Date Value Ref Range Status   ??? C. Diff Result 12/28/2018 Negative  Negative Final    Clostridium difficile NOT detected

## 2018-12-29 NOTE — Unmapped (Signed)
Sonya Williams 's CAPECITABINE shipment will be delayed due to No refills We have contacted the patient and communicated the delivery change to patient/caregiver We will call the patient to reschedule the delivery upon resolution. We have confirmed the delivery date as  .

## 2018-12-29 NOTE — Unmapped (Signed)
Addended by: Raelene Bott on: 12/29/2018 02:30 PM     Modules accepted: Orders

## 2018-12-29 NOTE — Unmapped (Signed)
Encounter addended by: West Bali, MD on: 12/29/2018 11:07 AM   Actions taken: Clinical Note Signed

## 2018-12-30 MED FILL — CAPECITABINE 500 MG TABLET: 21 days supply | Qty: 56 | Fill #0 | Status: AC

## 2018-12-30 NOTE — Unmapped (Signed)
RX received -- no delivery delay. Spoke with patient to confirm and shipped via same day courier. AC

## 2019-01-03 ENCOUNTER — Ambulatory Visit: Admit: 2019-01-03 | Discharge: 2019-01-04 | Payer: MEDICARE

## 2019-01-03 DIAGNOSIS — Z171 Estrogen receptor negative status [ER-]: Secondary | ICD-10-CM

## 2019-01-03 DIAGNOSIS — C50412 Malignant neoplasm of upper-outer quadrant of left female breast: Principal | ICD-10-CM

## 2019-01-03 LAB — COMPREHENSIVE METABOLIC PANEL
ALBUMIN: 4 g/dL (ref 3.5–5.0)
ALKALINE PHOSPHATASE: 65 U/L (ref 38–126)
ALT (SGPT): 8 U/L (ref <35)
ANION GAP: 12 mmol/L (ref 7–15)
AST (SGOT): 18 U/L (ref 14–38)
BILIRUBIN TOTAL: 0.7 mg/dL (ref 0.0–1.2)
BLOOD UREA NITROGEN: 17 mg/dL (ref 7–21)
BUN / CREAT RATIO: 17
CALCIUM: 9.5 mg/dL (ref 8.5–10.2)
CHLORIDE: 106 mmol/L (ref 98–107)
CO2: 25 mmol/L (ref 22.0–30.0)
EGFR CKD-EPI AA FEMALE: 68 mL/min/{1.73_m2} (ref >=60)
EGFR CKD-EPI NON-AA FEMALE: 59 mL/min/{1.73_m2} — ABNORMAL LOW (ref >=60)
GLUCOSE RANDOM: 92 mg/dL (ref 70–179)
POTASSIUM: 4 mmol/L (ref 3.5–5.0)
PROTEIN TOTAL: 6.8 g/dL (ref 6.5–8.3)
SODIUM: 143 mmol/L (ref 135–145)

## 2019-01-03 LAB — CBC W/ AUTO DIFF
BASOPHILS ABSOLUTE COUNT: 0 10*9/L (ref 0.0–0.1)
BASOPHILS RELATIVE PERCENT: 0.4 %
EOSINOPHILS ABSOLUTE COUNT: 0.4 10*9/L (ref 0.0–0.4)
EOSINOPHILS RELATIVE PERCENT: 9.2 %
HEMATOCRIT: 31 % — ABNORMAL LOW (ref 36.0–46.0)
HEMOGLOBIN: 10.2 g/dL — ABNORMAL LOW (ref 13.5–16.0)
LARGE UNSTAINED CELLS: 2 % (ref 0–4)
LYMPHOCYTES ABSOLUTE COUNT: 0.7 10*9/L — ABNORMAL LOW (ref 1.5–5.0)
LYMPHOCYTES RELATIVE PERCENT: 17.6 %
MEAN CORPUSCULAR HEMOGLOBIN: 32.7 pg (ref 26.0–34.0)
MEAN CORPUSCULAR VOLUME: 99.2 fL (ref 80.0–100.0)
MEAN PLATELET VOLUME: 8.1 fL (ref 7.0–10.0)
MONOCYTES ABSOLUTE COUNT: 0.2 10*9/L (ref 0.2–0.8)
NEUTROPHILS ABSOLUTE COUNT: 2.7 10*9/L (ref 2.0–7.5)
NEUTROPHILS RELATIVE PERCENT: 64.6 %
PLATELET COUNT: 126 10*9/L — ABNORMAL LOW (ref 150–440)
RED CELL DISTRIBUTION WIDTH: 17.1 % — ABNORMAL HIGH (ref 12.0–15.0)
WBC ADJUSTED: 4.1 10*9/L — ABNORMAL LOW (ref 4.5–11.0)

## 2019-01-03 LAB — EGFR CKD-EPI AA FEMALE: Lab: 68

## 2019-01-03 LAB — WBC ADJUSTED: Lab: 4.1 — ABNORMAL LOW

## 2019-01-03 NOTE — Unmapped (Signed)
Pt is aware of PHONE visit with mcmahon on 01/04/2019 and meds were reviewed

## 2019-01-04 ENCOUNTER — Institutional Professional Consult (permissible substitution): Admit: 2019-01-04 | Discharge: 2019-01-05 | Payer: MEDICARE | Attending: Family | Primary: Family

## 2019-01-04 DIAGNOSIS — C50412 Malignant neoplasm of upper-outer quadrant of left female breast: Principal | ICD-10-CM

## 2019-01-04 DIAGNOSIS — Z171 Estrogen receptor negative status [ER-]: Secondary | ICD-10-CM

## 2019-01-04 NOTE — Unmapped (Addendum)
Hematology/Oncology Patient Outreach Note  Symptoms, coping, and resources:  How are you coping with the stress of COVID while managing your cancer? Staying indoor, keeps her house clean and wears her mask when going out.     On a scale from 0 to 10, where 0 is no distress and 10 is extreme distress, can you tell me what your level of distress is now? 0   -Distress score 0-3 --> Referral at discretion of clinician  -Distress score 4-7 --> Inbasket to [facility]   -Distress score >= 8 --> page SW    Do you feel unsafe at home? No   -If concerns for domestic violence --> SW, PFRC, [employer] referral    Have you been having any symptoms recently? Just started new medication: Xeloda with no new symptoms.  Are any of these symptoms uncontrolled? No  -Advanced cancer and high symptom burden or patient report of inadequately controlled symptoms --> palliative care referral    Are you having any financial difficulties? No  Are you having difficulties with access to food, housing, transportation, or child care? No   -Difficulty with basic needs or finances --> SW, [facility] referral    Do you need refills for any of your medications? No  Do you have any questions about how to take your medications? No   -Major difficulties with medications, including financial difficulty --> Disease group CPP referral     Advanced care planning:  We are committed to making sure that all of your care continues to be as consistent with your goals and values as possible.     --If you were ever unable to speak for yourself about your medical decisions, who would you want your oncologist to speak with to help make important decisions about your care? Pattricia Boss    OR    --From my review of Epic, it looks like you have designated Meryl Dare as your healthcare decision maker if you are ever unable to speak for yourself. Is that still the person you would like to help make important decisions about your care? Yes    Would you like more information on designating a healthcare power of attorney or writing advance directives? N/A   -Information on HCPOA or advance directives --> SW referral    Current   Code Status: DNR and DNI    Advance Care Planning     Participants: [name]   Discussion/Action: 10 minutes    Health Care Decision Maker as of 01/04/2019    HCDM (patient stated preference): Meryl Dare - Sister - [phone]     Cancer care:  Do you have any questions about the plan of care and instructions given at your last appointment? No   -If yes, refer to last provider note. If answer to patient question is still unclear, send message to primary team    Do you know when your next oncology appointment is? Yes  Do you have any difficulties with making this appointment? No   -Barriers to care --> SW, PFRC referral    Do you need information on how to get in touch with someone from your care team? Yes, she was given our main number at the cancer hospital and how to contact use with COVID symptoms.    -During normal business hours, please call [phone] to speak to a member of your cancer team.    -For emergencies during nights, weekends and holidays, please call [phone] and ask for the hematologist/oncologist on  call   -For emergencies for non-English speaking patients, please call the interpretor line at [phone] and ask for the hematologist/oncologist on call   -MyChart can also be used for non-urgent general questions, medication refills, and scheduling issues     Do you need access to MyChart? No   -Help getting access to MyChart --> PFRC referral      Follow up plan:  Active issues: None  Referrals placed: No additional referrals needed at this time.    Marzetta Merino program: Call [phone]   -Comprehensive Cancer Support Program (CCSP): Epic order: ambulatory referral CCSP (for urgent needs, page social work)   -Palliative care: Epic order: ambulatory referral palliative care   -Social work: [employer] page: outpatient oncology social work adult   -[facility]): Epic inbasket message to p [place] cancer support - resource center cancer hospital   --> Can also give patients and caregivers the [facility] phone number: [phone]  Time frame for follow-up: 2-3 weeks    Approximate time spent on this outreach call: 40 minutes.      Scheduled appointments:  Future Appointments   Date Time Provider Department Center   01/05/2019  9:00 AM Felicity Coyer, MD [facility]   01/14/2019 11:00 AM Pollyann Kennedy, MD [facility]   02/10/2019 10:00 AM Maceo Pro, FNP [facility]   03/01/2019 11:00 AM Marcy Panning, FNP [facility]   04/06/2019 11:15 AM [facility] RM 1 [facility] [place]   04/06/2019  1:30 PM Alan Ripper, MD [facility]   04/08/2019  2:20 PM Felicity Coyer, MD [facility]

## 2019-01-04 NOTE — Unmapped (Addendum)
BREAST MEDICAL ONCOLOGY - Telephone Encounter (in lieu of in-person visit during COVID-19 pandemic).     Patient Name: Sonya Williams  Patient Age: 68 y.o.female  Encounter Date: 01/04/2019    Primary Care Provider:  Garnette Czech, MD    Referring Physician:  Felicity Coyer, MD    Breast Med/Onc:  Servando Salina, MD    PATIENT LOCATION DURING CALL:  HOME   OTHERS PRESENT DURING CALL: None  REASON FOR VISIT: Management of breast cancer.  CURRENT TREATMENT: Xeloda    ASSESSMENT   1. Malignant neoplasm of upper-outer quadrant of left breast in female, estrogen receptor negative (CMS-HCC)    ??This is a 68 y.o. year old female with a clinical stage IIIB (cT2cN1xM0),??infiltrating ductal carcinoma, grade 2, estrogen receptor negative, progesterone receptor negative and HER2 negativeebruary 2??of the left breast, diagnosed 11/03/2017.  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      She completed neoadjuvant weekly carboplatin with an AUC of 1.5, along with standard dosing paclitaxel at 80 mg/m2 for a planned total of 12 weeks of treatment. Only completed half the treatment prior to proceeding with surgical resection.  Treatment stopped because of hospitalization for diarrhea falls and seizures. She has significant residual cancer based on pathology from her July 2019 mastectomy with 1/8 nodes positive for disease.  Based on her RCB score she had an 80% chance of recurrence in 5 years without any further therapy, therefore she resumed therapy completing 4 cycles of dose dense AC without any major toxicity.  She is now status post radiation and has started adjuvant Xeloda thousand milligrams twice daily and tolerating well after her first cycle.  We will continue to follow her closely given her comorbid conditions, but at this time she is tolerating Xeloda quite well without significant hand-foot syndrome, mucositis or worsening of her chronic diarrhea.    Cancer Staging  Malignant neoplasm of upper-outer quadrant of left breast in female, estrogen receptor negative (CMS-HCC)  Staging form: Breast, AJCC 8th Edition  - Clinical stage from 11/11/2017: Stage IIIB (cT2, cN1(f), cM0, G2, ER: Negative, PR: Negative, HER2: Equivocal) - Signed by Rudie Meyer, ANP on 11/11/2017  - Pathologic stage from 03/16/2018: No Stage Recommended (ypT1c, pN1a, cM0, G3, ER-, PR-, HER2-) - Signed by Marcy Panning, FNP on 06/02/2018      PLAN     -Treatment goals: Curative    -Systemic therapy: Started first cycle of Xeloda 12/13/2018; tolerating well with stable labs.  We will continue with treatment based on the rationale below as noted by Dr. Garlon Hatchet:  The patient does not meet the ideal criteria of the create X trial as she had only partial neoadjuvant therapy which was stopped for toxicity, then residual cancer and then 4 cycles of dose dense AC.  I am ambivalent this where the recommend Xeloda here in view of her persistent diarrhea and the major toxicities she had with Taxol and carboplatinum.  I am going to have our pharmacist call her and discuss the toxicities of treatment.  We might consider starting at a low dose but her diarrhea has been a major problem and its etiology remains unclear.  I discussed all these issues with the patient.   - Bone health: Last DEXA scan 02/27/17 showing T-scores in the osteoporosis range, however she was not prescribed any bisphosphonate therapy, likely given her history of poor  dentition.  Plan to repeat next bone density and August 2020 when she is due for mammogram.  - Genetics: Seen by Herbert Pun and no genetic testing recommended  - Smoking: has tried numerous methods and continues to smoke probably a little less than a pack a day.  Was referred to smoking cessation clinic previously.    -Complex partial seizures. Follows with neurology, last partial seizure 10/2018.  Has a follow-up planned with Dr. Loma Newton -02/10/2019  -Diarrhea: Chronic and fairly controlled with Imodium over-the-counter.  Reports no worsening of her diarrhea since starting Xeloda.  Last colonoscopy was in 03/2019 with benign pathology.  Planning to follow-up with GI 01/14/2019 with Dr. Maren Beach    DISPO:   --Labs in [place] on 11/25/2018  --3 weeks provider phone visit with Rayvon Char 11/26/2018    [name], NP, MSN  Breast Oncology Division  Pager [phone]  [email or web address]    I spent 30 minutes on the phone with the patient. I spent an additional 10 minutes on pre- and post-visit activities.     The patient was physically located in [place] or a state in which I am permitted to provide care. The patient and/or parent/gauardian understood that s/he may incur co-pays and cost sharing, and agreed to the telemedicine visit. The visit was completed via phone and/or video, which was appropriate and reasonable under the circumstances given the patient's presentation at the time.    The patient and/or parent/guardian has been advised of the potential risks and limitations of this mode of treatment (including, but not limited to, the absence of in-person examination) and has agreed to be treated using telemedicine. The patient's/patient's family's questions regarding telemedicine have been answered.     If the phone/video visit was completed in an ambulatory setting, the patient and/or parent/guardian has also been advised to contact their provider???s office for worsening conditions, and seek emergency medical treatment and/or call 911 if the patient deems either necessary.    INTERVAL HISTORY     Interval History:   [name] 68 y.o. female being called today for management of breast cancer.      --Patient presents today for her 3-week follow-up after starting adjuvant Xeloda.  States she is feeling well overall.    --Started her first cycle of Xeloda on 12/13/2018.  Taking 2 tablets twice daily as directed, 2 weeks on 1 week off.    --Denies any significant nausea, vomiting or weight loss, last weight was taken in house patient weighed 106 pounds 10/19/2018.  Denies any hand-foot syndrome, or mucositis.  Applying topical lubricants as directed, and performing salt water rinses 2-3 times per day.    --Reports baseline diarrhea, which is been a chronic issue.  Takes Imodium up to 6 capsules/day.  Today she has had 2 bowel movements and has taken 2 doses of Imodium for her liquid stools.  She denies any blood in her stool and is actively followed by her PCP Dr. Graciela Husbands and will also be following up with a GI specialist 01/14/2019, Dr. Maren Beach.  She had stool cultures drawn most recently on 12/28/2018 for both C. difficile and a GI culture stool panel, both of which were negative.  Reports that Dr. Graciela Husbands collected a stool sample last week and she will be hearing back from him tomorrow regarding her results.  Last colonoscopy performed 03/02/2018, pathology was benign.    --Reports no worsening of her partial seizures.  She is followed closely by her neurologist Dr. Loma Newton, next follow-up scheduled for  02/10/2019.  Last seizure was in February 2020 lasted 8 seconds she was at home and well supported by her sister any cares per her and is her POA.  She denies any recent changes in her seizure history medication.    --She denies any chest pain, DOE or palpitations.  Occasionally she says she has some swelling in her lower extremities.  Today she has not noticed any swelling.  She continues to stay active at home performing her ADLs such as sweeping, washing clothes, and cooking when she is able.  She denies any recent falls in the past 6 months.    --Performance status 1 due to nonbreast cancer issues such as diarrhea      ROS (bolded to indicate positive)   Symptom Notes   Appetite/Fatigue/Fever/Chills    Constipation/Diarrhea/Nausea/Vomiting  X--see above   Pain    Hot flashes    Anxiety/Depression/Sleep changes    Cough/SOB Itching/Rash    Peripheral neuropathy/Swelling  X--see above   Headache    Urinary complaints    Sexual dysfunction/Vaginal dryness    Other  X--see above       HEMATOLOGICAL / ONCOLOGICAL HISTORY:        Malignant neoplasm of upper-outer quadrant of left breast in female, estrogen receptor negative (CMS-HCC)    10/2017 -  Presenting Symptoms     Patient self-palpated left breast mass prompting Dx imaging.      10/28/2017 Interval Scan(s)     B/L Dx MMG: Left breast ~3.1 x 2.8 cm mass w/calcs UOQ. Grouped coarse heterogeneous calcifications in the upper outer quadrant of the right breast measuring 0.6 cm.   [place]: Left breast 2:00 position, 6CFN mass 2.8 x 1.3 x 2.6 cm with internal calcifications. There is an adjacent ill-defined hypoechoic area next to the primary mass measuring approximately 2.3 x 0.7 cm which may represent an extension of the mass versus satellite lesion.    [place]: Left axilla abnormal lymph node or mass measuring 2.0 x 0.9 x 1.2 cm. Multiple other normal-appearing lymph nodes are demonstrated.    Targeted diagnostic ultrasound of the right breast in the upper-outer quadrant performed to further evaluate calcifications demonstrated on the mammogram due to the patient's difficulty with tolerating a possible stereotactic guided biopsy. The calcifications were not demonstrated sonographically. Normal breast parenchyma was demonstrated.      11/03/2017 Biopsy     Rt breast SBx: fibroadenoma w/calcs  Lt breast CBx: IDC, G2, microcalcs a/w carcinoma  Lt axillary LN: + metastatic adenoCA    Receptors: ER- PR- Her2 Eq 2+ FISH NON-amplified.        11/20/2017 Interval Scan(s)     PET  --Intensely FDG avid left breast invasive ductal carcinoma with intensely avid left axillary biopsy proven nodal metastasis.  -- Moderately avid right breast mass is superior/anterior to the right breast clip, likely small hemorrhage and inflammation given recent stereotactic biopsy. Mild right axillary uptake is likely reactive.   -- No evidence of distant metastatic disease.  -- Enlarged, heterogeneous thyroid without abnormal FDG uptake. Findings compatible with multinodular goiter, as was described on thyroid ultrasound 05/01/2010.       Other     Carbo/Taxol  - C1/12 12/21/17. Carbo AUC 1, paclitaxel 80/m2.  Breast mass responding well to treatment.  - C3/12: 01/04/18 Carbo escalated to 1.5 AUC, paclitaxel 80 mg/m?? some decrease in hemoglobin.    - C4/12:  In view of ANC 1.2 decrease carbo back to AUC 1.0 this week and next  -  C6/12: 01/27/18 HOLD Carbo due to Cr 1.75, only paclitaxel 80 mg/m2 given  - C7/12: 02/03/18 HOLD carbo and paclitaxel due to ANC 900. At this point, doubt that she will tolerate additional chemotherapy given her multiple medical co-morbidities      12/01/2017 - 01/31/2018 Chemotherapy     Chemotherapy Treatment    Treatment Goal Curative   Line of Treatment [No plan line of treatment]   Plan Name OP BREAST PACLItaxel WEEKLY / CARBOplatin EVERY 3 WEEKS   Start Date 12/21/2017   End Date 01/27/2018   Provider Fayne Norrie Muss, MD   Chemotherapy dexamethasone (DECADRON) tablet 20 mg, 20 mg, Oral, Once, 2 of 4 cycles  Administration: 20 mg (12/21/2017), 20 mg (12/28/2017), 20 mg (01/04/2018), 20 mg (01/11/2018), 20 mg (01/18/2018), 20 mg (01/27/2018)  CARBOplatin (PARAPLATIN) 117.6 mg in sodium chloride (NS) 0.9 % 100 mL IVPB, 117.6 mg (92.7 % of original dose 126.9 mg), Intravenous, Once, 2 of 4 cycles  Dose modification:   (original dose 126.9 mg, Cycle 1)  Administration: 74.8 mg (12/21/2017), 89.1 mg (12/28/2017), 123 mg (01/04/2018), 85.3 mg (01/11/2018), 76.8 mg (01/18/2018)  PACLItaxel (TAXOL) 126.42 mg in sodium chloride 0.9% NON-PVC (NS) 0.9 % 250 mL IVPB, 80 mg/m2 = 126.42 mg, Intravenous, Once, 2 of 4 cycles  Administration: 126.42 mg (12/21/2017), 126.42 mg (12/28/2017), 126.42 mg (01/04/2018), 126.42 mg (01/11/2018), 126.42 mg (01/18/2018), 126.42 mg (01/27/2018)         03/16/2018 Surgery     Left mastectomy+ALND: IDC, T=36mm, G3, Probable to Definite response to NACT. 1/18 (macromets=73mm) RCB: II-3.235      03/16/2018 -  Cancer Staged     Staging form: Breast, AJCC 8th Edition  - Pathologic stage from 03/16/2018: No Stage Recommended (ypT1c, pN1a, cM0, G3, ER-, PR-, HER2-) - Signed by Marcy Panning, FNP on 06/02/2018        06/02/2018 - 07/13/2018 Chemotherapy     OP BREAST AC (DOSE DENSE) x 4 cycles  DOXOrubicin 60 mg/m2, Cyclophosphamide 600 mg/m2 every 14 days        08/11/2018 - 09/27/2018 Radiation     approximate date completions. Dr. Carles Collet HBO      12/13/2018 -  Chemotherapy     Capecitabine 1000 mg po BID x 14 days then 7 days off  Start date: 12/13/18  Pharmacy:  [facility]   Cycle 2 start date: 01/03/19        Invasive ductal carcinoma of breast, female, left (CMS-HCC)    12/21/2017 - 01/27/2018 Chemotherapy     Taxol Carbo with major toxicity              OTHER PAST MEDICAL HISTORY:     Patient Active Problem List   Diagnosis   ??? HTN   ??? Pure hypercholesterolemia (RAF-HCC)   ??? Vitamin D deficiency   ??? Tobacco use     ??? Goiter (RAF-HCC)   ??? Problem with medical care compliance   ??? Coronary Artery Disease s/p PCI to RCA   ??? Situational depression   ??? Heart failure (CMS-HCC)   ??? Focal epilepsy with impairment of consciousness, intractable (CMS-HCC)   ??? Malignant neoplasm of upper-outer quadrant of left breast in female, estrogen receptor negative (CMS-HCC)   ??? Invasive ductal carcinoma of breast, female, left (CMS-HCC)   ??? Falls frequently   ??? Diarrhea   ??? 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 Anemia   ??? Anemia in neoplastic  disease       ALLERGIES:     Iodinated contrast media and Other    MEDICATIONS:     Outpatient Encounter Medications as of 01/04/2019   Medication Sig Dispense Refill   ??? acetaminophen (TYLENOL) 500 MG tablet Take 500 mg by mouth every four (4) hours as needed for pain.     ??? amLODIPine (NORVASC) 10 MG tablet Take 1 tablet (10 mg total) by mouth daily. 90 tablet 1   ??? aspirin 81 MG chewable tablet Chew 81 mg once daily.      ??? atorvastatin (LIPITOR) 40 MG tablet TAKE 1 TABLET EVERY DAY 90 tablet 0   ??? betamethasone dipropionate (DIPROLENE) 0.05 % ointment Apply to rash twice a day 90 g 3   ??? capecitabine (XELODA) 500 MG tablet TAKE 2 TABLETS (1000MG ) BY MOUTH TWICE DAILY FOR 14 DAYS ON THEN 7 DAYS OFF. 56 tablet 1   ??? carvedilol (COREG) 25 MG tablet TAKE 1 TABLET TWICE DAILY 180 tablet 3   ??? clonazePAM (KLONOPIN) 1 MG tablet Take 1 tablet (1 mg total) by mouth Two (2) times a day. 60 tablet 5   ??? furosemide (LASIX) 20 MG tablet Take 1 tablet (20 mg total) by mouth daily as needed. 15 tablet 3   ??? gabapentin (NEURONTIN) 300 MG capsule TAKE 1 CAPSULE BY MOUTH 3 TIMES DAILY 270 capsule 3   ??? levETIRAcetam (KEPPRA) 500 MG tablet Take 2 tablets in the morning and 3 tabs at night. 450 tablet 4   ??? loperamide HCl (IMODIUM A-D ORAL) Take 6 tablets by mouth daily as needed.      ??? losartan (COZAAR) 100 MG tablet Take 1 tablet (100 mg total) by mouth daily. 90 tablet 3   ??? meTHIMazole (TAPAZOLE) 5 MG tablet Take 1 tablet (5 mg total) by mouth two (2) times a day. 180 tablet 1   ??? mometasone (ELOCON) 0.1 % cream Apply to left chest twice daily during radiation treatment 45 g 1   ??? ondansetron (ZOFRAN) 8 MG tablet Take 1 tablet (8 mg total) by mouth every eight (8) hours as needed for nausea. 30 tablet 2   ??? potassium chloride (KLOR-CON) 10 MEQ CR tablet Take 1 tablet (10 mEq total) by mouth daily. Only take this if you are taking the furosemide (lasix) 30 tablet 11   ??? prochlorperazine (COMPAZINE) 10 MG tablet Take 10 mg by mouth every six (6) hours as needed for nausea.     ??? silver sulfaDIAZINE (SILVADENE, SSD) 1 % cream Apply to affected area daily 50 g 1   ??? spironolactone (ALDACTONE) 25 MG tablet Take 1 tablet (25 mg total) by mouth daily. 90 tablet 3   ??? [DISCONTINUED] cholecalciferol, vitamin D3, (CHOLECALCIFEROL) 1,000 unit tablet Take 1,000 Units by mouth once daily.      ??? [DISCONTINUED] meTHIMazole (TAPAZOLE) 5 MG tablet Take 1 tablet (5 mg total) by mouth two (2) times a day. (Patient not taking: Reported on 01/03/2019) 60 tablet 0   ??? [DISCONTINUED] phenytoin (DILANTIN) 100 MG ER capsule Take 3 capsules (300 mg total) by mouth nightly. 270 capsule 3     No facility-administered encounter medications on file as of 01/04/2019.

## 2019-01-05 MED ORDER — FUROSEMIDE 20 MG TABLET
ORAL_TABLET | Freq: Every day | ORAL | 3 refills | 0 days | Status: CP | PRN
Start: 2019-01-05 — End: 2019-03-28
  Filled 2019-01-18: qty 15, 15d supply, fill #0

## 2019-01-05 MED ORDER — ATORVASTATIN 40 MG TABLET
ORAL_TABLET | Freq: Every day | ORAL | 3 refills | 0 days | Status: CP
Start: 2019-01-05 — End: 2019-01-07

## 2019-01-05 NOTE — Unmapped (Addendum)
Continue your present plan of drinking decaffeinated or herbal tea and using over-the-counter Imodium AC as needed for diarrhea.      Let Dr. Graciela Husbands know if the diarrhea gets worse and needs some other intervention.     Continue on your present dosage of Methimazole 5mg  twice daily to control your over active thyroid.     Keep August wellness visit with Dr. Graciela Husbands.

## 2019-01-05 NOTE — Unmapped (Signed)
Assessment:        1. Diarrhea, unspecified type    2. Graves disease            Plan:        Patient instructions:   Continue your present plan of drinking decaffeinated or herbal tea and using over-the-counter Imodium AC as needed for diarrhea.      Let Dr. Graciela Husbands know if the diarrhea gets worse and needs some other intervention.     Continue on your present dosage of Methimazole 5mg  twice daily to control your over active thyroid.     Keep August wellness visit with Dr. Graciela Husbands.     Barriers to goals identified and addressed. Pertinent handouts were given today and reviewed with the patient as indicated.  The Care Plan and Self-Management goals have been included on the AVS and the AVS has been printed. Any outside resources or referrals needed at this time are noted above.  Any new medications prescribed have been discussed, and side effects have been addressed. Have assessed the patient's understanding, response, and barriers to adherence to medications. Patient voiced understanding and all questions have been answered to satisfaction.        Subjective:     Sonya Williams is a 68 y.o. female.    I spent 11 minutes on the phone with the patient. I spent an additional 4 minutes on pre- and post-visit activities.     The patient was physically located in [place] or a state in which I am permitted to provide care. The patient and/or parent/gauardian understood that s/he may incur co-pays and cost sharing, and agreed to the telemedicine visit. The visit was completed via phone and/or video, which was appropriate and reasonable under the circumstances given the patient's presentation at the time.    The patient and/or parent/guardian has been advised of the potential risks and limitations of this mode of treatment (including, but not limited to, the absence of in-person examination) and has agreed to be treated using telemedicine. The patient's/patient's family's questions regarding telemedicine have been answered.     If the phone/video visit was completed in an ambulatory setting, the patient and/or parent/guardian has also been advised to contact their provider???s office for worsening conditions, and seek emergency medical treatment and/or call 911 if the patient deems either necessary.      Patient stated that no one else would be present during the phone visit.  If someone will be present patient stated they would be OK with them hearing the conversation.    HPI:  This visit is for: follow up of long standing diarrhea.  Since last seen she stopped the Magnesium which did not seem to make much difference in her diarrhea.  She did notice that when she stopped her pepsis many months ago her diarrhea improved but did not resolve.  She continues to have 2-7 diarrheal stools per day.  A few weeks ago after she had a flare up with nocturnal diarrhea she started drinking decaff tea and it helped her diarrhea.  She continues to take 0-6 Imodium per day w/o SE and it does seem to help.  She was seen by GI last summer and had a normal colonoscopy with bx March 02, 2018.  She does not feel the need for other evaluation or intervention at this time.     Re: Graves disease recent TFTs were fine.     Re: her BP she has been seeing numbers in the 120-130s/70-80s.  She states she is taking her meds as prescribed.     The following portions of the patient's history were reviewed and updated as appropriate: allergies, current medications, past family history, past medical history, past social history, past surgical history and problem list.    Review of Systems  Pertinent items are noted in HPI.          Objective:      Physical Exam:    Wt Readings from Last 6 Encounters:   10/19/18 48.2 kg (106 lb 4.8 oz)   10/12/18 46.7 kg (102 lb 14.4 oz)   09/06/18 47.9 kg (105 lb 8 oz)   08/30/18 48.3 kg (106 lb 8 oz)   08/16/18 45.8 kg (101 lb)   08/10/18 44.8 kg (98 lb 12.8 oz)     LABS:   12/27/2018 10:55 12/28/2018 10:48 01/03/2019 09:45   WBC 4.1 (L)   RBC   3.12 (L)   HGB   10.2 (L)   HCT   31.0 (L)   MCV   99.2   MCH   32.7   MCHC   33.0   RDW   17.1 (H)   MPV   8.1   Platelet   126 (L)   Neutrophils %   64.6   Lymphocytes %   17.6   Monocytes %   5.8   Eosinophils %   9.2   Basophils %   0.4   Absolute Neutrophils   2.7   Absolute Lymphocytes   0.7 (L)   Absolute Monocytes    0.2   Absolute Eosinophils   0.4   Absolute Basophils    0.0   Macrocytosis   Slight (A)   Anisocytosis   Slight (A)   Large Unstained Cells   2   Sodium 141  143   Potassium 4.0  4.0   Chloride 107  106   CO2 25.0  25.0   Bun 15  17   Creatinine 0.98  1.00   BUN/Creatinine Ratio 15  17   EGFR CKD-EPI African American, Female 70  68   EGFR CKD-EPI Non-African American, Female 60  59 (L)   Anion Gap 9  12   Glucose 87  92   Calcium 9.2  9.5   Magnesium 2.1     Albumin   4.0   Total Protein   6.8   Total Bilirubin   0.7   AST   18   ALT   8   Alkaline Phosphatase   65   TSH 0.469 (L)     Free T4 1.28     Vitamin D Total (25OH) 34.0     Norovirus  Negative    Cryptosporidium  Negative    Giardia  Negative    E. coli: O157  Negative    E. coli: Enterotoxigenic (ETEC)  Negative    E. coli: Shiga-toxin (STEC)  Negative    Salmonella  Negative    Shigella  Negative    Campylobacter  Negative    Rotavirus  Negative    C. Diff Result  Negative    CLOSTRIDIUM DIFFICILE ASSAY  Rpt

## 2019-01-07 MED ORDER — ATORVASTATIN 40 MG TABLET
ORAL_TABLET | 3 refills | 0 days | Status: CP
Start: 2019-01-07 — End: ?

## 2019-01-13 NOTE — Unmapped (Signed)
Patient given instructions on phone visit and prescreening completed. Patient states understanding of instructions

## 2019-01-13 NOTE — Unmapped (Signed)
Provided follow-up counseling to Sonya Williams, 68 y.o. for treatment of tobacco use/dependence. Session was brief because pt had recently woken up from a nap and was tired.    SUMMARY: SW re-assessed pt's tobacco use status and followed up about treatment plan. Pt had not made any further progress toward becoming tobacco-free. However, her use had not increased and she was considering using nicotine lozenge. SW encouraged pt to try this for additional assistance in getting down to 0 cpd. SW made plans to follow up with pt in a few weeks.    Tobacco Use Treatment  Program: [facility]  Type of Visit: Follow-up  Session Number: 3  Tobacco Use Treatment Visit: Talked with patient  Permission To Engage In Conversation Re: PHI w/ Visitors Present: n/a  Goals Of Session: Insight, increase, Assessment, Risk behaviors, modulate, Behavior management, improve    Cancer Center Patients  Primary Service: Hem Onc  Primary Cancer Type: Breast    Tobacco Use During Past 30 Days  Time Since Last Tobacco Use: smoked a cigarette today (at least one puff)  Tobacco Withdrawal (Past 24 Hours): None noted  Type of Tobacco Products Used: Cigarettes  Quantity Used: 3  Quantity Per: day    Behavioral Assessment  Barriers/Challenges: 1. Pt reports she has had difficulty getting down to 0 cpd. SW normalized this challenge.  Strategies: 1. Pt considering purchasing nicotine lozenge, which she has seen advertised on TV. SW counseled pt on correct use of lozenge    Treatment Plan  Cessation Meds Currently Using: None  Outpatient/Discharge Medications Recommended: Lozenge 4mg (Pt considering this)  Plan to Obtain Outpatient Meds: Over-the-counter  Patient's Plan Post Discharge/Visit: Reduce tobacco use  Follow-up Plan: Permission given, Phone follow-up scheduled  Family Members Included in Intervention/Plan: No    TTS Information  Diagnosis: Tobacco use disorder, unspecified, uncomplicated (F17.200)  Interventions: Assessed, Discussed, Suggested, Tx plan development, Encouraged  TTS Visit Length: 3-10 minutes  Follow-up Data: 3 month FU    Lesle Chris  Tobacco Treatment Specialist  [facility]  [phone]

## 2019-01-14 ENCOUNTER — Institutional Professional Consult (permissible substitution): Admit: 2019-01-14 | Discharge: 2019-01-15 | Payer: MEDICARE

## 2019-01-14 DIAGNOSIS — C50412 Malignant neoplasm of upper-outer quadrant of left female breast: Principal | ICD-10-CM

## 2019-01-14 DIAGNOSIS — Z171 Estrogen receptor negative status [ER-]: Secondary | ICD-10-CM

## 2019-01-14 DIAGNOSIS — R197 Diarrhea, unspecified: Secondary | ICD-10-CM

## 2019-01-18 MED FILL — CLONAZEPAM 1 MG TABLET: ORAL | 30 days supply | Qty: 60 | Fill #2

## 2019-01-18 MED FILL — FUROSEMIDE 20 MG TABLET: 15 days supply | Qty: 15 | Fill #0 | Status: AC

## 2019-01-18 MED FILL — CLONAZEPAM 1 MG TABLET: 30 days supply | Qty: 60 | Fill #2 | Status: AC

## 2019-01-18 MED FILL — GABAPENTIN 300 MG CAPSULE: ORAL | 90 days supply | Qty: 270 | Fill #1

## 2019-01-18 MED FILL — GABAPENTIN 300 MG CAPSULE: 90 days supply | Qty: 270 | Fill #1 | Status: AC

## 2019-01-18 NOTE — Unmapped (Addendum)
I'm glad your diarrhea has resolved.    Decrease Imodium   Continue dietary changes  If diarrhea returns, obtain KUB to see if you have overflow diarrhea    [name]. Maren Beach, MD MPH  Gastroenterology Fellow

## 2019-01-18 NOTE — Unmapped (Signed)
Patient Outreach Video/Phone Encounter     Provider attests that she is currently located in the [place]; Patient confirms to be located in the [place] of [place] as well.      Patient agrees to video health visit.    22 minutes were spent on this medically necessary service. Pt visit by  VIDEO or PHONE  in the setting of State of Emergency due to COVID-19 Pandemic.      This patient visit was completed through the use of an audio/video or telephone encounter.      This patient encounter is appropriate and reasonable under the circumstances given the patient's particular presentation at this time. The patient has been advised of the potential risks and limitations of this mode of treatment (including, but not limited to, the absence of in-person examination) and has agreed to be treated in a remote fashion in spite of them. Any and all of the patient's/patient's family's questions on this issue have been answered.      The patient has also been advised to contact this office for worsening conditions or problems, and seek emergency medical treatment and/or call 911 if the patient deems either necessary.    GASTROENTEROLOGY CONSULTATION NOTE (RETURN CLINIC)    Reason for Consult: diarrhea    Referring Physician: Felicity Coyer  01/17/2019    Assessment and Plan:  Sonya Williams is a 68 y.o. female with clinical Stage IIIB cT2N1M0  left IDC triple negative breast cancer s/p partial dose neoadjuvant chemotherapy and 2019 mastectomy (currently on Xeloda), Graves disease (controlled on Methimazole) who I originally saw for ongoing weight loss and three years of diarrhea in June 2019 (diarrhea proceeded chemotherapy).  Stool studies normal and an inpatient colonoscopy had stool throughout but normal mucosa and random biopsies negative for microscopic colitis.  She was referred back to me for diarrhea occurring in April/May 2020 despite taking multiple Imodium daily.    However she has not had diarrhea in the last two weeks taking only one Imodium daily and cutting out greasy/fried foods and drinking an herbal tea from the grocery store.  I wonder if she had overflow diarrhea from constipation given previous moderate stool burden on a PET scan and also significant stool burden with colonoscopy despite clean out. Recent complaints of diarrhea did not along chronologically with newer chemotherapy Xeloda (capecitabine).    Comorbiditues include: hx seizures, CAD with 2 stents placed for CAD in 2014 and is on ASA and Plavix. Last Echo 11/27/17 with severe LVH but normal EF 70%; grade II diastolic dysfunction. She is a long time smoker, now smoking 1/2 pack of cigarettes per day, since age 33.     Recommendations:  -Suspect overflow diarrhea.  She begins to have diarrhea again would recommend KUB to assess for stool burden.  If moderate or large would recommend GoLYTELY cleanout and daily fiber supplement to see if this helps clean her out and then bulk her stools.  - do not increase imodium without notifying myself or Dr. Graciela Husbands     This patient was discussed with Dr. Gustavus Messing who spoke with the patient on the phone.     [name]. Maren Beach, MD MPH  Gastroenterology Fellow    Interval History 01/14/2019  Patient saw PCP 2 weeks ago and still complained of ongoing diarrhea (~5 daily) despite taking 6 Imodium daily. GIPP and C diff negative in April 2020. No melena, hematemesis or hematochezia.  Denies greasy or fatty stools.  He  states and T4 were normal on stable methimazole dose.  Denies abdominal pain, nausea, vomiting.    Since I last saw her she had a colonoscopy with a large amount of stool burden but normal appearing mucosa and random biopsies negative for microscopic colitis.  Last abdominal imaging is a full body PET scan in March 2019 with moderate colonic stool burden.    Initial History of Presenting Illness:   Patient seen in consultation at the request of Dr. Felicity Coyer for diarrhea.    Patient has been having diarrhea for the last 4 years (documented in PCP notes) which is described as watery, occurring after she eats (3-6 times per day), associated with abdominal cramping that is relieved by having a bowel movement.  Has urgency and occasional incontinence (wears a diaper because of this). Does have nocturnal bowel movements especially if she eats at night.  Has tried to cut down on diary with no effect. Otherwise cannot identify certain foods that make it worse. Has decreased po intake because diarrhea has gotten worse over last 6 months. Has tried imodium prn but says it doesn't change her consistency. No other medications have been tried. C diff negative 3 years ago, positive fecal lactoferrin. No family history of IBD or colon cancer.  Has mild hyperthyroidism, never been treated. Originally thought Lipitor caused diarrhea but after stopping the medication her diarrhea continued, she is now back on Lipitor.    Has lost weight with recent cancer diagnosis and also decreasing po intake because of diarrhea.    Last colonoscopy in 2013 with 2 small polyps (non-adenomatous) but a poor prep.  She reports trying to prep an additional two times but could not tolerate prep 2/2 N/V.    Is planning for mastectomy July 14th but may be limited by decreased functional status and continued diarrhea.    The following portions of the patient's history were reviewed and updated as appropriate: allergies, current medications, past family history, past medical history, past social history, past surgical history and problem list.    Review of Systems:  The balance of 12 systems reviewed is negative except as noted in the HPI.     Past Medical History  Past Medical History:   Diagnosis Date   ??? Breast cancer (CMS-HCC)     left br ca   ??? CAD S/P percutaneous coronary angioplasty 09/2012   ??? Eczema    ??? Endometriosis    ??? Goiter NOS    ??? Heart attack (CMS-HCC) 09/2012   ??? HTN    ??? Hyperlipidemia    ??? NSTEMI (non-ST elevated myocardial infarction) (CMS-HCC) 08/2015   ??? Osteoporosis    ??? SEIZURE DISORDER    ??? Tobacco use    ??? Vitamin D deficiency NOS        GI Procedures: Reviewed. Pertinent findings include:  Colonoscopy 03/2018  Findings:       The perianal and digital rectal examinations were normal.       A large amount of liquid stool was found in the entire colon,        interfering with visualization. Lavage of the area was performed using a        large amount, resulting in clearance with good visualization.       The exam was otherwise normal throughout the examined colon.       Biopsies for histology were taken with a cold forceps from the right        colon and  left colon for evaluation of microscopic colitis.       The terminal ileum appeared normal.       The retroflexed view of the distal rectum and anal verge was normal and        showed no anal or rectal abnormalities.    Colonoscopy 2013:  Impression: ?? ?? ?? ??- Preparation of the colon was poor.  ???? ?? ?? ?? ?? ?? ?? ?? ?? - One 5 mm polyp in the rectum. Resected and retrieved.  ???? ?? ?? ?? ?? ?? ?? ?? ?? - One 2 mm polyp in the cecum. This was biopsied.  Recommendation: ?? ??- Repeat colonoscopy in 2 years because the bowel   ???? ?? ?? ?? ?? ?? ?? ?? ?? preparation was suboptimal and for surveillance.  ???? ?? ?? ?? ?? ?? ?? ?? ?? - Return to referring physician.    Radiographic studies:  Reviewed. Pertinent findings include:  PET Scan 10/2018  - GI Tract: Diffuse gastric uptake may reflect gastritis. Moderate colonic stool burden. Normal appendix.  Findings:  -- Intensely FDG avid left breast invasive ductal carcinoma with intensely avid left axillary biopsy proven nodal metastasis.  -- Moderately avid right breast mass is superior/anterior to the right breast clip (CT 85), likely small hemorrhage and inflammation given recent stereotactic biopsy. Mild right axillary uptake is likely reactive.   -- No evidence of distant metastatic disease.  -- Enlarged, heterogeneous thyroid without abnormal FDG uptake. Findings compatible with multinodular goiter, as was described on thyroid ultrasound 05/01/2010.    Medications  Medications were reviewed with the patient and include:  Current Outpatient Medications   Medication Sig Dispense Refill   ??? acetaminophen (TYLENOL) 500 MG tablet Take 500 mg by mouth every four (4) hours as needed for pain.     ??? amLODIPine (NORVASC) 10 MG tablet Take 1 tablet (10 mg total) by mouth daily. 90 tablet 1   ??? aspirin 81 MG chewable tablet Chew 81 mg once daily.      ??? atorvastatin (LIPITOR) 40 MG tablet TAKE 1 TABLET EVERY DAY 90 tablet 3   ??? betamethasone dipropionate (DIPROLENE) 0.05 % ointment Apply to rash twice a day 90 g 3   ??? capecitabine (XELODA) 500 MG tablet TAKE 2 TABLETS (1000MG ) BY MOUTH TWICE DAILY FOR 14 DAYS ON THEN 7 DAYS OFF. 56 tablet 1   ??? carvedilol (COREG) 25 MG tablet TAKE 1 TABLET TWICE DAILY 180 tablet 3   ??? clonazePAM (KLONOPIN) 1 MG tablet Take 1 tablet (1 mg total) by mouth Two (2) times a day. 60 tablet 5   ??? furosemide (LASIX) 20 MG tablet Take 1 tablet (20 mg total) by mouth daily as needed for swelling. 15 tablet 3   ??? gabapentin (NEURONTIN) 300 MG capsule TAKE 1 CAPSULE BY MOUTH 3 TIMES DAILY 270 capsule 3   ??? levETIRAcetam (KEPPRA) 500 MG tablet Take 2 tablets in the morning and 3 tabs at night. 450 tablet 4   ??? loperamide HCl (IMODIUM A-D ORAL) Take 6 tablets by mouth daily as needed.      ??? losartan (COZAAR) 100 MG tablet Take 1 tablet (100 mg total) by mouth daily. 90 tablet 3   ??? meTHIMazole (TAPAZOLE) 5 MG tablet Take 1 tablet (5 mg total) by mouth two (2) times a day. 180 tablet 1   ??? mometasone (ELOCON) 0.1 % cream Apply to left chest twice daily during radiation treatment 45 g 1   ???  potassium chloride (KLOR-CON) 10 MEQ CR tablet Take 1 tablet (10 mEq total) by mouth daily. Only take this if you are taking the furosemide (lasix) 30 tablet 11   ??? prochlorperazine (COMPAZINE) 10 MG tablet Take 10 mg by mouth every six (6) hours as needed for nausea. ??? silver sulfaDIAZINE (SILVADENE, SSD) 1 % cream Apply to affected area daily 50 g 1   ??? ondansetron (ZOFRAN) 8 MG tablet Take 1 tablet (8 mg total) by mouth every eight (8) hours as needed for nausea. (Patient not taking: Reported on 01/05/2019) 30 tablet 2   ??? spironolactone (ALDACTONE) 25 MG tablet Take 1 tablet (25 mg total) by mouth daily. (Patient not taking: Reported on 01/05/2019) 90 tablet 3     No current facility-administered medications for this visit.      Family History  Family History   Problem Relation Age of Onset   ??? Cancer Mother         Stomach-mets to bone   ??? Stroke Father    ??? Hypertension Father    ??? Lung cancer Sister    ??? Alcohol Use Disorder Other    ??? Hypertension Other    ??? Cancer Maternal Grandmother    ??? Cancer Maternal Grandfather    ??? Cancer Paternal Grandmother    ??? Colon cancer Sister    ??? No Known Problems Daughter    ??? No Known Problems Paternal Grandfather    ??? Melanoma Neg Hx    ??? Basal cell carcinoma Neg Hx    ??? Squamous cell carcinoma Neg Hx    ??? Breast cancer Neg Hx    ??? BRCA 1/2 Neg Hx    ??? Endometrial cancer Neg Hx    ??? Ovarian cancer Neg Hx    No family history of IBD or colon cancer/polyps.    Social History  Social History     Tobacco Use   ??? Smoking status: Current Every Day Smoker     Last attempt to quit: 11/08/2014     Years since quitting: 4.1   ??? Smokeless tobacco: Never Used   ??? Tobacco comment: 3 ciggarettes a day   Substance Use Topics   ??? Alcohol use: No     Alcohol/week: 0.0 standard drinks       Allergies  Allergies   Allergen Reactions   ??? Iodinated Contrast Media Hives and Other (See Comments)     Rash    ??? Other Hives     Metal-reaction=rash       Vitals: There were no vitals filed for this visit.  Wt Readings from Last 10 Encounters:   10/19/18 48.2 kg (106 lb 4.8 oz)   10/12/18 46.7 kg (102 lb 14.4 oz)   09/06/18 47.9 kg (105 lb 8 oz)   08/30/18 48.3 kg (106 lb 8 oz)   08/16/18 45.8 kg (101 lb)   08/10/18 44.8 kg (98 lb 12.8 oz)   07/21/18 46.4 kg (102 lb 3 oz)   07/13/18 48.5 kg (107 lb)   07/13/18 48.9 kg (107 lb 11.2 oz)   06/30/18 52.3 kg (115 lb 4.8 oz)       Labs:   Outside labs reviewed. Pertinent findings include:  Lab Results   Component Value Date    ALKPHOS 65 01/03/2019    BILITOT 0.7 01/03/2019    BILIDIR 0.40 07/13/2018    PROT 6.8 01/03/2019    ALBUMIN 4.0 01/03/2019    ALT 8 01/03/2019    AST  18 01/03/2019     Lab Results   Component Value Date    PT 12.0 06/04/2018    PT 11.5 03/20/2018    PT 11.5 03/19/2018    INR 1.04 06/04/2018    INR 1.01 03/20/2018    INR 1.01 03/19/2018     Lab Results   Component Value Date    WBC 4.1 (L) 01/03/2019    RBC 3.12 (L) 01/03/2019    HGB 10.2 (L) 01/03/2019    HCT 31.0 (L) 01/03/2019    MCV 99.2 01/03/2019    MCH 32.7 01/03/2019    MCHC 33.0 01/03/2019    RDW 17.1 (H) 01/03/2019    PLT 126 (L) 01/03/2019       Lab Results   Component Value Date/Time    ESR 49 (H) 12/24/2017 09:14 AM    WBC 4.1 (L) 01/03/2019 09:45 AM    WBC 4.0 (L) 11/30/2018 08:28 AM    WBC 4.8 10/19/2018 09:56 AM    WBC 5.1 11/08/2014 03:51 PM    WBC 4.6 06/12/2014 06:56 AM    WBC 4.2 (L) 06/11/2014 05:40 AM    AST 18 01/03/2019 09:45 AM    AST 18 11/30/2018 08:28 AM    AST 17 10/19/2018 09:56 AM    AST 23 11/08/2014 03:51 PM    AST 24 04/14/2014 04:22 PM    AST 34 09/06/2012 10:20 AM    ALT 8 01/03/2019 09:45 AM    ALT 8 11/30/2018 08:28 AM    ALT 10 10/19/2018 09:56 AM    ALT 16 11/08/2014 03:51 PM    ALT 25 04/14/2014 04:22 PM    ALT 26 09/06/2012 10:20 AM       No orders of the defined types were placed in this encounter.

## 2019-01-18 NOTE — Unmapped (Signed)
This was a telehealth service where a resident was involved. I evaluated the patient via real-time audio connection, participating in the key portions of the service.  I reviewed the resident's note.  I agree with the resident's findings and plan.

## 2019-01-20 NOTE — Unmapped (Signed)
[facility] Refill Coordination Note    Specialty Medication(s) to be Shipped:   Hematology/Oncology: Capecitabine 500mg     Other medication(s) to be shipped:   -NA     Sonya Williams, Sonya Williams  DOB: 1950-10-17  Phone: [phone] (home)   Shipping Address: [address]  [place] [address]    All above HIPAA information was verified with patient.     Completed refill call assessment today to schedule patient's medication shipment from the [facility] [phone]).       Specialty medication(s) and dose(s) confirmed: Regimen is correct and unchanged.   Changes to medications: Sonya Williams reports no changes reported at this time.  Changes to insurance: No  Questions for the pharmacist: No    Confirmed patient received Welcome Packet with first shipment. The patient will receive a drug information handout for each medication shipped and additional FDA Medication Guides as required.       DISEASE/MEDICATION-SPECIFIC INFORMATION        N/A        SPECIALTY MEDICATION ADHERENCE     Medication Adherence    Patient reported X missed doses in the last month:  0  Specialty Medication:  capecitabine   Patient is on additional specialty medications:  No  Informant:  patient  Confirmed plan for next specialty medication refill:  delivery by pharmacy  Refills needed for supportive medications:  not needed          Refill Coordination    Has the Patients' Contact Information Changed:  No  Is the Shipping Address Different:  Yes  Updated Shipping Address:  [address] [place]                    CAPECITABINE 500 mg: 10 days of medicine on hand          ADDITIONAL NOTES         N/A        SHIPPING     Shipping Information    Delivery Scheduled:  Yes  Delivery Date:  01/26/19         Shipping address confirmed in Epic.     Delivery Scheduled: Yes, Expected medication delivery date: 5/27.     Medication will be delivered via Next Day Courier to the prescription address in Epic WAM.    Jolene Schimke [facility] Specialty Technician

## 2019-01-21 ENCOUNTER — Ambulatory Visit: Admit: 2019-01-21 | Discharge: 2019-01-22 | Payer: MEDICARE

## 2019-01-21 DIAGNOSIS — Z171 Estrogen receptor negative status [ER-]: Secondary | ICD-10-CM

## 2019-01-21 DIAGNOSIS — C50412 Malignant neoplasm of upper-outer quadrant of left female breast: Principal | ICD-10-CM

## 2019-01-21 LAB — CBC W/ AUTO DIFF
BASOPHILS ABSOLUTE COUNT: 0 10*9/L (ref 0.0–0.1)
BASOPHILS RELATIVE PERCENT: 0.6 %
EOSINOPHILS ABSOLUTE COUNT: 0.2 10*9/L (ref 0.0–0.4)
EOSINOPHILS RELATIVE PERCENT: 6.2 %
HEMATOCRIT: 31.8 % — ABNORMAL LOW (ref 36.0–46.0)
HEMOGLOBIN: 10.2 g/dL — ABNORMAL LOW (ref 13.5–16.0)
LARGE UNSTAINED CELLS: 4 % (ref 0–4)
LYMPHOCYTES ABSOLUTE COUNT: 0.6 10*9/L — ABNORMAL LOW (ref 1.5–5.0)
LYMPHOCYTES RELATIVE PERCENT: 16.1 %
MEAN CORPUSCULAR HEMOGLOBIN CONC: 32.1 g/dL (ref 31.0–37.0)
MEAN CORPUSCULAR HEMOGLOBIN: 33.1 pg (ref 26.0–34.0)
MEAN CORPUSCULAR VOLUME: 103.2 fL — ABNORMAL HIGH (ref 80.0–100.0)
MEAN PLATELET VOLUME: 8.5 fL (ref 7.0–10.0)
MONOCYTES ABSOLUTE COUNT: 0.3 10*9/L (ref 0.2–0.8)
MONOCYTES RELATIVE PERCENT: 7 %
NEUTROPHILS RELATIVE PERCENT: 66.5 %
RED BLOOD CELL COUNT: 3.08 10*12/L — ABNORMAL LOW (ref 4.00–5.20)
WBC ADJUSTED: 3.9 10*9/L — ABNORMAL LOW (ref 4.5–11.0)

## 2019-01-21 LAB — COMPREHENSIVE METABOLIC PANEL
ALKALINE PHOSPHATASE: 75 U/L (ref 38–126)
ALT (SGPT): 8 U/L (ref <35)
ANION GAP: 7 mmol/L (ref 7–15)
AST (SGOT): 18 U/L (ref 14–38)
BILIRUBIN TOTAL: 0.5 mg/dL (ref 0.0–1.2)
BLOOD UREA NITROGEN: 26 mg/dL — ABNORMAL HIGH (ref 7–21)
BUN / CREAT RATIO: 26
CALCIUM: 9.6 mg/dL (ref 8.5–10.2)
CHLORIDE: 108 mmol/L — ABNORMAL HIGH (ref 98–107)
CO2: 27 mmol/L (ref 22.0–30.0)
EGFR CKD-EPI AA FEMALE: 67 mL/min/{1.73_m2} (ref >=60)
EGFR CKD-EPI NON-AA FEMALE: 58 mL/min/{1.73_m2} — ABNORMAL LOW (ref >=60)
GLUCOSE RANDOM: 94 mg/dL (ref 70–179)
POTASSIUM: 3.6 mmol/L (ref 3.5–5.0)
PROTEIN TOTAL: 7.2 g/dL (ref 6.5–8.3)
SODIUM: 142 mmol/L (ref 135–145)

## 2019-01-21 LAB — MEAN CORPUSCULAR VOLUME: Lab: 103.2 — ABNORMAL HIGH

## 2019-01-21 LAB — SMEAR REVIEW

## 2019-01-21 LAB — CREATININE: Creatinine:MCnc:Pt:Ser/Plas:Qn:: 1.01 — ABNORMAL HIGH

## 2019-01-24 NOTE — Unmapped (Signed)
BREAST MEDICAL ONCOLOGY - Telephone Encounter (in lieu of in-person visit during COVID-19 pandemic).     Patient Name: Sonya Williams  Patient Age: 68 y.o.female  Encounter Date: 01/25/2019    Primary Care Provider:  Garnette Czech, MD    Referring Physician:  Felicity Coyer, MD    Breast Med/Onc:  Servando Salina, MD    PATIENT LOCATION DURING CALL:  HOME   OTHERS PRESENT DURING CALL: None  REASON FOR VISIT: Management of breast cancer.  CURRENT TREATMENT: Xeloda    ASSESSMENT   1. Malignant neoplasm of upper-outer quadrant of left breast in female, estrogen receptor negative (CMS-HCC)    ??This is a 68 y.o. year old female with a clinical stage IIIB (cT2cN1xM0),??infiltrating ductal carcinoma, grade 2, estrogen receptor negative, progesterone receptor negative and HER2 negativeebruary 2??of the left breast, diagnosed 11/03/2017.  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      She completed neoadjuvant weekly carboplatin with an AUC of 1.5, along with standard dosing paclitaxel at 80 mg/m2 for a planned total of 12 weeks of treatment. Only completed half the treatment prior to proceeding with surgical resection.  Treatment stopped because of hospitalization for diarrhea falls and seizures. She has significant residual cancer based on pathology from her July 2019 mastectomy with 1/8 nodes positive for disease.  Based on her RCB score she had an 80% chance of recurrence in 5 years without any further therapy, therefore she resumed therapy completing 4 cycles of dose dense AC without any major toxicity.  She is now status post radiation and started adjuvant Xeloda thousand milligrams twice daily and tolerating well, so will proceed to C#3 of Xeloda. We will continue to follow her closely given her comorbid conditions, but at this time she is tolerating Xeloda quite well without significant hand-foot syndrome, mucositis or worsening of her chronic diarrhea.    Cancer Staging  Malignant neoplasm of upper-outer quadrant of left breast in female, estrogen receptor negative (CMS-HCC)  Staging form: Breast, AJCC 8th Edition  - Clinical stage from 11/11/2017: Stage IIIB (cT2, cN1(f), cM0, G2, ER: Negative, PR: Negative, HER2: Equivocal) - Signed by Rudie Meyer, ANP on 11/11/2017  - Pathologic stage from 03/16/2018: No Stage Recommended (ypT1c, pN1a, cM0, G3, ER-, PR-, HER2-) - Signed by Marcy Panning, FNP on 06/02/2018      PLAN     -Treatment goals: Curative    -Systemic therapy: Started first cycle of Xeloda 12/13/2018; tolerating well with stable labs.  We will continue with treatment based on the rationale below as noted by Dr. Garlon Hatchet:  The patient does not meet the ideal criteria of the create X trial as she had only partial neoadjuvant therapy which was stopped for toxicity, then residual cancer and then 4 cycles of dose dense AC.  I am ambivalent this where the recommend Xeloda here in view of her persistent diarrhea and the major toxicities she had with Taxol and carboplatinum.  I am going to have our pharmacist call her and discuss the toxicities of treatment.  We might consider starting at a low dose but her diarrhea has been a major problem and its etiology remains unclear.  I discussed all these issues with the patient.   - Bone health: Last DEXA scan 02/27/17 showing T-scores in the osteoporosis range, however she was not prescribed any bisphosphonate therapy, likely given her history of  poor dentition.  Plan to repeat next bone density and August 2020 when she is due for mammogram.  - Genetics: Seen by Herbert Pun and no genetic testing recommended  - Smoking: has tried numerous methods and continues to smoke probably a little less than a pack a day.  Was referred to smoking cessation clinic previously.    -Complex partial seizures. Follows with neurology, last partial seizure 10/2018.  Has a follow-up planned with Dr. Loma Newton -02/10/2019  -Diarrhea: Diarrhea now resolved with cutting back on caffeine and BID Imodium.  Last colonoscopy was in 03/2019 with benign pathology.  Planning to follow-up with GI 01/14/2019 with Dr. Maren Beach    DISPO:   --02/10/19 Labs and Dr. Loma Newton (Neurology)  --02/10/19 Phone visit with Dr. Garlon Hatchet    [name], NP, MSN  Breast Oncology Division  Pager [phone]  [email or web address]    I spent 30 minutes on the phone with the patient. I spent an additional 10 minutes on pre- and post-visit activities.     The patient was physically located in [place] or a state in which I am permitted to provide care. The patient and/or parent/gauardian understood that s/he may incur co-pays and cost sharing, and agreed to the telemedicine visit. The visit was completed via phone and/or video, which was appropriate and reasonable under the circumstances given the patient's presentation at the time.    The patient and/or parent/guardian has been advised of the potential risks and limitations of this mode of treatment (including, but not limited to, the absence of in-person examination) and has agreed to be treated using telemedicine. The patient's/patient's family's questions regarding telemedicine have been answered.     If the phone/video visit was completed in an ambulatory setting, the patient and/or parent/guardian has also been advised to contact their provider???s office for worsening conditions, and seek emergency medical treatment and/or call 911 if the patient deems either necessary.    INTERVAL HISTORY     Interval History:   [name] 68 y.o. female being called today for management of breast cancer.      --Patient presents today for her 3-week follow-up currently on Xeloda.  States she is feeling well overall.    --Started her first cycle of Xeloda on 12/13/2018.  Taking 2 tablets twice daily as directed, 2 weeks on 1 week off.    --Denies any significant nausea, vomiting or weight loss, last weight was taken in house patient weighed 106 pounds 10/19/2018.  Denies any hand-foot syndrome, or mucositis.  Applying topical lubricants as directed, and performing salt water rinses 2-3 times per day.    --Reports history of baseline diarrhea, resolved with cutting back on caffeine and taking BID Imodium.  She denies any blood in her stool and is actively followed by her PCP Dr. Graciela Husbands.   Recent f/u with GI specialist 01/14/2019, Dr. Maren Beach.  She had negative stool cultures collected most recently on 12/28/2018 for both C. difficile and a GI culture stool panel.  Last colonoscopy performed 03/02/2018, pathology was benign.    --Reports no worsening of her partial seizures.  She is followed closely by her neurologist Dr. Loma Newton, next follow-up scheduled for 02/10/2019.  Last seizure was in February 2020 lasted 8 seconds she was at home and well supported by her sister any cares per her and is her POA.  She denies any recent changes in her seizure history medication.    --She denies any chest pain, DOE or palpitations.  Occasionally she says she has  some swelling in her lower extremities.  Today she has not noticed any swelling.  She continues to stay active at home performing her ADLs such as sweeping, washing clothes, and cooking when she is able.  She denies any recent falls in the past 6 months.    --Performance status 1 due to nonbreast cancer issues such as diarrhea      ROS (bolded to indicate positive)   Symptom Notes   Appetite/Fatigue/Fever/Chills    Constipation/Diarrhea/Nausea/Vomiting  X--see above   Pain    Hot flashes    Anxiety/Depression/Sleep changes    Cough/SOB    Itching/Rash    Peripheral neuropathy/Swelling  X--see above   Headache    Urinary complaints    Sexual dysfunction/Vaginal dryness    Other  X--see above       HEMATOLOGICAL / ONCOLOGICAL HISTORY:        Malignant neoplasm of upper-outer quadrant of left breast in female, estrogen receptor negative (CMS-HCC) 10/2017 -  Presenting Symptoms     Patient self-palpated left breast mass prompting Dx imaging.      10/28/2017 Interval Scan(s)     B/L Dx MMG: Left breast ~3.1 x 2.8 cm mass w/calcs UOQ. Grouped coarse heterogeneous calcifications in the upper outer quadrant of the right breast measuring 0.6 cm.   [place]: Left breast 2:00 position, 6CFN mass 2.8 x 1.3 x 2.6 cm with internal calcifications. There is an adjacent ill-defined hypoechoic area next to the primary mass measuring approximately 2.3 x 0.7 cm which may represent an extension of the mass versus satellite lesion.    [place]: Left axilla abnormal lymph node or mass measuring 2.0 x 0.9 x 1.2 cm. Multiple other normal-appearing lymph nodes are demonstrated.    Targeted diagnostic ultrasound of the right breast in the upper-outer quadrant performed to further evaluate calcifications demonstrated on the mammogram due to the patient's difficulty with tolerating a possible stereotactic guided biopsy. The calcifications were not demonstrated sonographically. Normal breast parenchyma was demonstrated.      11/03/2017 Biopsy     Rt breast SBx: fibroadenoma w/calcs  Lt breast CBx: IDC, G2, microcalcs a/w carcinoma  Lt axillary LN: + metastatic adenoCA    Receptors: ER- PR- Her2 Eq 2+ FISH NON-amplified.        11/20/2017 Interval Scan(s)     PET  --Intensely FDG avid left breast invasive ductal carcinoma with intensely avid left axillary biopsy proven nodal metastasis.  -- Moderately avid right breast mass is superior/anterior to the right breast clip, likely small hemorrhage and inflammation given recent stereotactic biopsy. Mild right axillary uptake is likely reactive.   -- No evidence of distant metastatic disease.  -- Enlarged, heterogeneous thyroid without abnormal FDG uptake. Findings compatible with multinodular goiter, as was described on thyroid ultrasound 05/01/2010.       Other     Carbo/Taxol  - C1/12 12/21/17. Carbo AUC 1, paclitaxel 80/m2.  Breast mass responding well to treatment.  - C3/12: 01/04/18 Carbo escalated to 1.5 AUC, paclitaxel 80 mg/m?? some decrease in hemoglobin.    - C4/12:  In view of ANC 1.2 decrease carbo back to AUC 1.0 this week and next  - C6/12: 01/27/18 HOLD Carbo due to Cr 1.75, only paclitaxel 80 mg/m2 given  - C7/12: 02/03/18 HOLD carbo and paclitaxel due to ANC 900. At this point, doubt that she will tolerate additional chemotherapy given her multiple medical co-morbidities      12/01/2017 - 01/31/2018 Chemotherapy     Chemotherapy Treatment  Treatment Goal Curative   Line of Treatment [No plan line of treatment]   Plan Name OP BREAST PACLItaxel WEEKLY / CARBOplatin EVERY 3 WEEKS   Start Date 12/21/2017   End Date 01/27/2018   Provider Fayne Norrie Muss, MD   Chemotherapy dexamethasone (DECADRON) tablet 20 mg, 20 mg, Oral, Once, 2 of 4 cycles  Administration: 20 mg (12/21/2017), 20 mg (12/28/2017), 20 mg (01/04/2018), 20 mg (01/11/2018), 20 mg (01/18/2018), 20 mg (01/27/2018)  CARBOplatin (PARAPLATIN) 117.6 mg in sodium chloride (NS) 0.9 % 100 mL IVPB, 117.6 mg (92.7 % of original dose 126.9 mg), Intravenous, Once, 2 of 4 cycles  Dose modification:   (original dose 126.9 mg, Cycle 1)  Administration: 74.8 mg (12/21/2017), 89.1 mg (12/28/2017), 123 mg (01/04/2018), 85.3 mg (01/11/2018), 76.8 mg (01/18/2018)  PACLItaxel (TAXOL) 126.42 mg in sodium chloride 0.9% NON-PVC (NS) 0.9 % 250 mL IVPB, 80 mg/m2 = 126.42 mg, Intravenous, Once, 2 of 4 cycles  Administration: 126.42 mg (12/21/2017), 126.42 mg (12/28/2017), 126.42 mg (01/04/2018), 126.42 mg (01/11/2018), 126.42 mg (01/18/2018), 126.42 mg (01/27/2018)         03/16/2018 Surgery     Left mastectomy+ALND: IDC, T=86mm, G3, Probable to Definite response to NACT. 1/18 (macromets=52mm) RCB: II-3.235      03/16/2018 -  Cancer Staged     Staging form: Breast, AJCC 8th Edition  - Pathologic stage from 03/16/2018: No Stage Recommended (ypT1c, pN1a, cM0, G3, ER-, PR-, HER2-) - Signed by Marcy Panning, FNP on 06/02/2018        06/02/2018 - 07/13/2018 Chemotherapy     OP BREAST AC (DOSE DENSE) x 4 cycles  DOXOrubicin 60 mg/m2, Cyclophosphamide 600 mg/m2 every 14 days        08/11/2018 - 09/27/2018 Radiation     approximate date completions. Dr. Carles Collet HBO      12/13/2018 -  Chemotherapy     Capecitabine 1000 mg po BID x 14 days then 7 days off  Start date: 12/13/18  Pharmacy:  [facility]   Cycle 2 start date: 01/03/19        Invasive ductal carcinoma of breast, female, left (CMS-HCC)    12/21/2017 - 01/27/2018 Chemotherapy     Taxol Carbo with major toxicity              OTHER PAST MEDICAL HISTORY:     Patient Active Problem List   Diagnosis   ??? HTN   ??? Pure hypercholesterolemia (RAF-HCC)   ??? Vitamin D deficiency   ??? Tobacco use     ??? Goiter (RAF-HCC)   ??? Problem with medical care compliance   ??? Coronary Artery Disease s/p PCI to RCA   ??? Situational depression   ??? Heart failure (CMS-HCC)   ??? Focal epilepsy with impairment of consciousness, intractable (CMS-HCC)   ??? Malignant neoplasm of upper-outer quadrant of left breast in female, estrogen receptor negative (CMS-HCC)   ??? Invasive ductal carcinoma of breast, female, left (CMS-HCC)   ??? Falls frequently   ??? Diarrhea   ??? 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 Anemia   ??? Anemia in neoplastic disease       ALLERGIES:     Iodinated contrast media and Other    MEDICATIONS:       Outpatient Encounter Medications as of 01/25/2019   Medication Sig Dispense Refill   ??? acetaminophen (TYLENOL) 500 MG tablet Take 500 mg by mouth every four (4) hours as needed  for pain.     ??? amLODIPine (NORVASC) 10 MG tablet Take 1 tablet (10 mg total) by mouth daily. 90 tablet 1   ??? aspirin 81 MG chewable tablet Chew 81 mg once daily.      ??? atorvastatin (LIPITOR) 40 MG tablet TAKE 1 TABLET EVERY DAY 90 tablet 3   ??? betamethasone dipropionate (DIPROLENE) 0.05 % ointment Apply to rash twice a day 90 g 3   ??? capecitabine (XELODA) 500 MG tablet TAKE 2 TABLETS (1000MG ) BY MOUTH TWICE DAILY FOR 14 DAYS ON THEN 7 DAYS OFF. 56 tablet 1   ??? carvedilol (COREG) 25 MG tablet TAKE 1 TABLET TWICE DAILY 180 tablet 3   ??? clonazePAM (KLONOPIN) 1 MG tablet Take 1 tablet (1 mg total) by mouth Two (2) times a day. 60 tablet 5   ??? dexAMETHasone (DECADRON) 4 MG tablet Take 2 tablets (8 mg total) by mouth daily. Take on Days 2, 3, and 4. (Patient not taking: Reported on 07/21/2018) 24 tablet 0   ??? fluconazole (DIFLUCAN) 200 MG tablet Take 2 tablets (400 mg total) by mouth once. for 1 dose 2 tablet 0   ??? fluconazole (DIFLUCAN) 200 MG tablet Take 2 tablets (400mg ) by mouth today and then Take 1 tablet (200 mg total) by mouth daily. for 13 days 15 tablet 0   ??? furosemide (LASIX) 20 MG tablet Take 1 tablet (20 mg total) by mouth daily as needed for swelling. 15 tablet 3   ??? gabapentin (NEURONTIN) 300 MG capsule TAKE 1 CAPSULE BY MOUTH 3 TIMES DAILY 270 capsule 3   ??? levETIRAcetam (KEPPRA) 500 MG tablet Take 2 tablets in the morning and 3 tabs at night. 450 tablet 4   ??? loperamide HCl (IMODIUM A-D ORAL) Take 6 tablets by mouth daily as needed.      ??? losartan (COZAAR) 100 MG tablet Take 1 tablet (100 mg total) by mouth daily. 90 tablet 3   ??? meTHIMazole (TAPAZOLE) 5 MG tablet Take 1 tablet (5 mg total) by mouth two (2) times a day. 180 tablet 1   ??? mometasone (ELOCON) 0.1 % cream Apply to left chest twice daily during radiation treatment 45 g 1   ??? ondansetron (ZOFRAN) 8 MG tablet Take 1 tablet (8 mg total) by mouth every eight (8) hours as needed for nausea. (Patient not taking: Reported on 01/05/2019) 30 tablet 2   ??? potassium chloride (KLOR-CON) 10 MEQ CR tablet Take 1 tablet (10 mEq total) by mouth daily. Only take this if you are taking the furosemide (lasix) 30 tablet 11   ??? prochlorperazine (COMPAZINE) 10 MG tablet Take 10 mg by mouth every six (6) hours as needed for nausea.     ??? silver sulfaDIAZINE (SILVADENE, SSD) 1 % cream Apply to affected area daily 50 g 1   ??? spironolactone (ALDACTONE) 25 MG tablet Take 1 tablet (25 mg total) by mouth daily. (Patient not taking: Reported on 01/05/2019) 90 tablet 3   ??? [DISCONTINUED] phenytoin (DILANTIN) 100 MG ER capsule Take 3 capsules (300 mg total) by mouth nightly. 270 capsule 3     No facility-administered encounter medications on file as of 01/25/2019.

## 2019-01-25 ENCOUNTER — Institutional Professional Consult (permissible substitution): Admit: 2019-01-25 | Discharge: 2019-01-26 | Payer: MEDICARE | Attending: Family | Primary: Family

## 2019-01-25 DIAGNOSIS — Z171 Estrogen receptor negative status [ER-]: Secondary | ICD-10-CM

## 2019-01-25 DIAGNOSIS — C50412 Malignant neoplasm of upper-outer quadrant of left female breast: Principal | ICD-10-CM

## 2019-01-25 MED FILL — CAPECITABINE 500 MG TABLET: 21 days supply | Qty: 56 | Fill #1 | Status: AC

## 2019-01-25 MED FILL — CAPECITABINE 500 MG TABLET: 21 days supply | Qty: 56 | Fill #1

## 2019-02-03 NOTE — Unmapped (Signed)
Provided follow-up counseling to Sonya Williams, 68 y.o. for treatment of tobacco use/dependence. This is the fourth and counseling session. Pt was encouraged to call back if she wanted support in the future.    SUMMARY: SW re-assessed pt's tobacco use status and followed up about treatment plan. Pt had made no further progress toward goals and appeared ambivalent about reducing tobacco use further. When asked about current level of use, pt stated I don't even know although she reported she was still way down. Pt also reported she had not picked up nicotine lozenge, and when SW attempted to identify barriers to purchasing nicotine lozenge, pt stated, I just didn't.     Tobacco Use Treatment  Program: [facility]  Type of Visit: Follow-up  Session Number: 4  Tobacco Use Treatment Visit: Talked with patient  Permission To Engage In Conversation Re: PHI w/ Visitors Present: n/a  Goals Of Session: Insight, increase, Assessment, Risk behaviors, modulate, Behavior management, improve    Cancer Center Patients  Primary Service: Hem Onc  Primary Cancer Type: Breast    Tobacco Use During Past 30 Days  Time Since Last Tobacco Use: smoked a cigarette today (at least one puff)  Tobacco Withdrawal (Past 24 Hours): None noted  Type of Tobacco Products Used: Cigarettes  Quantity Used: 3  Quantity Per: day    Behavioral Assessment  Barriers/Challenges: 1. Pt continues to exhibit ambivalence about quitting 2. Pt has not used smoking cessation medications  Strategies: 1. No new strategies identified    Treatment Plan  Cessation Meds Currently Using: None  Outpatient/Discharge Medications Recommended: Lozenge 4mg (Pt to consider further)  Plan to Obtain Outpatient Meds: Over-the-counter  Patient's Plan Post Discharge/Visit: Undecided  Follow-up Plan: Pt would prefer to contact counselor  Family Members Included in Intervention/Plan: No    TTS Information  Diagnosis: Tobacco use disorder, unspecified, uncomplicated (F17.200)  Interventions: Assessed, Discussed, Motivational interviewing, Tx plan development, Encouraged  TTS Visit Length: 3-10 minutes    Ignacia Palma, Clare Charon  Tobacco Treatment Specialist  [facility]  [phone]

## 2019-02-09 NOTE — Unmapped (Signed)
Guest policy reviewed

## 2019-02-10 ENCOUNTER — Ambulatory Visit: Admit: 2019-02-10 | Discharge: 2019-02-11 | Payer: MEDICARE

## 2019-02-10 ENCOUNTER — Institutional Professional Consult (permissible substitution): Admit: 2019-02-10 | Discharge: 2019-02-11 | Payer: MEDICARE

## 2019-02-10 MED ORDER — LEVETIRACETAM 500 MG TABLET
ORAL_TABLET | 4 refills | 0 days | Status: CP
Start: 2019-02-10 — End: ?

## 2019-02-10 MED ORDER — CLONAZEPAM 1 MG TABLET
ORAL_TABLET | Freq: Two times a day (BID) | ORAL | 5 refills | 30 days | Status: CP
Start: 2019-02-10 — End: ?
  Filled 2019-02-18: qty 60, 30d supply, fill #0

## 2019-02-10 MED ORDER — GABAPENTIN 300 MG CAPSULE
ORAL_CAPSULE | ORAL | 3 refills | 0 days | Status: CP
Start: 2019-02-10 — End: 2020-02-10
  Filled 2019-04-22: qty 270, 90d supply, fill #0

## 2019-02-10 NOTE — Unmapped (Signed)
Patient contacted about which [facility] to receive her lab work:  [facility], [address] [phone]  Fax:  [phone]    Blameless apology given for her trouble. Encouraged her to call from [facility] if they tell her she does not have an appointment. Gave her the RN triage line number.

## 2019-02-10 NOTE — Unmapped (Signed)
Hi,     Patient contacted the Communication Center requesting to speak with the care team of [name] to discuss:    Stated that she went to [facility] lab today to have labs drawn bu they did not have patient's orders in epic. Patient is back home now, she is requesting a call back from a nurse    Please contact Anyla at [phone].      Check Indicates criteria has been reviewed and confirmed with the patient:    [x]  Preferred Name   [x]  DOB and/or MR#  [x]  Preferred Contact Method  [x]  Phone Number(s)   []  MyChart     Thank you,   [name]  [facility]   [phone]

## 2019-02-10 NOTE — Unmapped (Signed)
No change in medication    Donnetta Hail FNP  [facility] Neurology Department  Epilepsy Division  [address] [place]  [place] [address]  Clinic Main line: [phone].   Clinic Fax number: [phone]

## 2019-02-10 NOTE — Unmapped (Signed)
AOC Triage Note     Patient: Sonya Williams     Reason for call:  return call    Time call returned: 1340     Phone Assessment: pt calling to report that she had gone to her scheduled appt for lab draw this morning at [facility], but there were no orders in so she wasted her time and trip.    She is asking that the orders be sent to [facility] in [place] to save her another trip.     Triage Recommendations: RN will consult with team and Rn will call the pt back once the orders have been placed     Caller Response: appreciation     Outstanding tasks: consult team and follow up with pt after orders have been placed

## 2019-02-10 NOTE — Unmapped (Signed)
Assessment:   Sonya Williams is a 68 y.o. old female who is being contacted for evaluation of seizure.     Patient consented to telephone visit   Patient reported presently at home in [facility]    Patient has no seizure since last visit.  she is tolerating current antiepileptic with no side effects  ??  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  Previous Seizure Medications:PHT (has become toxic with doses >??300 mg), LEV, GBP, ??CBZ, CNZ, and PB. CBZ and PB discontinued due to no improvement in seizure frequency, LTG (dizziness, gait instability - self discontinued)  ??  Plan:     *A total of 25 minutes was spent on this medically necessary service. Patient visit was performed over the phone in the setting of State of Emergency due to COVID-19 Pandemic.     I spent 15 minutes on the phone with the patient. I spent an additional 10 minutes on pre- and post-visit activities.     Current medications  Levetiracetam 500 mg-   2 tabs in am and 3 tabs at bedtime .   Clonazepam??1 mg twice a day  Gabapentin 300 mg 1 cap 3 times a day-??patient is aware of this medication potentially cause extremities edema, but did not want to change.    The following issues were discussed:  Antiepiletic medication side effects discussed and written information given. Encouraged patient to contact me if she experiences intolerable medication side effects or worsening seizures     Return to clinic for follow up in 6-9  months    EpilepsyHPI  Seizure History  Seizure Type??1: GTC  Seizure Description: between 2-4 am,   Aura: Ringing in ears, lights blinking, hears music, Left arm stiffness and starts shaking, left hand balls up and tied up, then convulsions, lasts 2-5 mins  Associated Symptoms:??tongue biting, urinary incontinence, usually tired after, confused. Wants to be covered up, can be agitated.  Seizure Frequency:??07/2016, prior to that 2009.  ??  Seizure Type??2: Left arm movements (more common) started on 2008  Seizure Description: Left arm stiffness and jerking, then her body gets limp, she calls for help and has to sit down. Lasts few seconds, has fallen on several occasions and has hit her face and had a black eye. No fall or LOC.  Associated Symptoms:??becomes weak  Seizure Frequency:??daily  Last seizure:??12/14/2017  ??  Birth/Developmental History: sister also has seizures. Denies trauma and meningitis prior to onset of new seizures.  ??  vEEG/ EMU evaluation: 06/12/14   IMPRESSION: This continuous EEG with video monitoring over two days captures six clinical seizures with stereotypical events of repetitive left upper extremity jerking without clear EEG correlates. However, semiology is highly suggestive of focal onset frontal lobe epilepsy, possibly arising from right motor cortex. The interictal recording does not show any definitive abnormalities. 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 ??????  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 Please note that over 10% of frontal lobe epilepsy does not have clear EEG correlates.  vEEG EMU evaluation 12/02/16 Impression: This long term video-EEG monitoring over 4 days captures five focal seizures without EEG correlate. The interictal EEG showed transient, sharply contoured activity in the delta-theta range which lasts for 30 seconds to 8 minutes during  drowsiness without clinical signs or symptoms. This is supportive of diagnosis of SREDA. 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 Of note, EEG does not show electrographic correlate for focal motor seizures.  MRI of brain:??05/15/15  --Bilateral peritrigonal white matter lesions, which are nonspecific but can be seen in the setting of demyelinating disease. No evidence of active demyelination.  --Please note, this study was performed for workup of demyelinating disease and incompletely characterizes the hippocampi.  NM Brain Spect 12/02/16:  No definite areas of uptake to suggest seizure focus.  ??    Since last visit, patient reported no seizure.  She is doing relatively well with only leg edema and feet pain.  She is most comfortable continuing with her current medications. She is about to do some house-work       PMH:   Past Medical History:   Diagnosis Date   ??? Breast cancer (CMS-HCC)     left br ca   ??? CAD S/P percutaneous coronary angioplasty 09/2012   ??? Eczema    ??? Endometriosis    ??? Goiter NOS    ??? Heart attack (CMS-HCC) 09/2012   ??? HTN    ??? Hyperlipidemia    ??? NSTEMI (non-ST elevated myocardial infarction) (CMS-HCC) 08/2015   ??? Osteoporosis    ??? SEIZURE DISORDER    ??? Tobacco use    ??? Vitamin D deficiency NOS          FMH:   Family History   Problem Relation Age of Onset   ??? Cancer Mother         Stomach-mets to bone   ??? Stroke Father    ??? Hypertension Father    ??? Lung cancer Sister    ??? Alcohol Use Disorder Other    ??? Hypertension Other    ??? Cancer Maternal Grandmother    ??? Cancer Maternal Grandfather    ??? Cancer Paternal Grandmother    ??? Colon cancer Sister    ??? No Known Problems Daughter    ??? No Known Problems Paternal Grandfather    ??? Melanoma Neg Hx    ??? Basal cell carcinoma Neg Hx    ??? Squamous cell carcinoma Neg Hx    ??? Breast cancer Neg Hx    ??? BRCA 1/2 Neg Hx    ??? Endometrial cancer Neg Hx    ??? Ovarian cancer Neg Hx          SMH:   Social History     Socioeconomic History   ??? Marital status: Divorced     Spouse name: Not on file   ??? Number of children: Not on file   ??? Years of education: Not on file   ??? Highest education level: Not on file   Occupational History   ??? Occupation: Retired   Chief Executive Officer Needs   ??? Financial resource strain: Not on file   ??? Food insecurity     Worry: Not on file     Inability: Not on file   ??? Transportation needs     Medical: Not on file     Non-medical: Not on file   Tobacco Use   ??? Smoking status: Current Every Day Smoker     Last attempt to quit: 11/08/2014     Years since quitting: 4.2   ??? Smokeless tobacco: Never Used   ??? Tobacco comment: 3 ciggarettes a day   Substance and Sexual Activity   ???  Alcohol use: No     Alcohol/week: 0.0 standard drinks   ??? Drug use: No   ??? Sexual activity: Not Currently   Lifestyle   ??? Physical activity     Days per week: Not on file     Minutes per session: Not on file   ??? Stress: Not on file   Relationships   ??? Social Wellsite geologist on phone: Not on file     Gets together: Not on file     Attends religious service: Not on file     Active member of club or organization: Not on file     Attends meetings of clubs or organizations: Not on file     Relationship status: Not on file   Other Topics Concern   ??? Do you use sunscreen? Yes   ??? Tanning bed use? No   ??? Are you easily burned? No   ??? Excessive sun exposure? No   ??? Blistering sunburns? No   Social History Narrative    Lives with: no one    # of servings of fruits and/or vegetables/day: 4    Sugar sweetened drinks intake:  3    Caffeine intake: 3    Intensity of physical activity:  None _____  Mild ___x__ Moderate _____ Vigorous _____    Exercise frequency:  Never ___ Occ. _x__ 2-3 x/wk ___ 4-5 x/wk ___ 6-7 x/wk ___    Smoke alarm in home?  Yes _x__ No ___     CO alarm?  Yes x___ No ___    Guns in home unloaded and locked: Yes ___ No ___ Not applicable _x__    Seatbelts in car: Yes __x_ No___     Cellphone while driving: Yes__x_  No__x_    Helmet when riding a bike: Yes _x__ No ___         Use sunscreen: Yes __x_ No ___ome in every other day to help her.    Hit, kicked, punched, or otherwise hurt by someone in the past 5 years:  Yes___ No__x_    Safe in your current relationship: Yes___ No___        03/23/18        PCMH Components:        Family, social, cultural characteristics: social support includes alone, her sister    Patient has the following communication needs: vision issue using glasses.    Health Literacy: How confident are you that you understand your health issues/concerns, can participate in your care, and manage your care along with your physician: confident.    Behaviors Affecting Health: secondhand Smoke Exposure.patient smokes cigarettes.    Family history of mental health illness and/or substance abuse: asked patient/parent and none disclosed.    Have you been seen by any medical provider that we have not referred you to since your last visit ? Yes [facility]    Discussed a Living Will with the patient and YNW:GNFA decline information about Living Will today.         Allergies: Iodinated contrast media and Other      Medications:   Current Outpatient Medications   Medication Sig Dispense Refill   ??? acetaminophen (TYLENOL) 500 MG tablet Take 500 mg by mouth every four (4) hours as needed for pain.     ??? amLODIPine (NORVASC) 10 MG tablet Take 1 tablet (10 mg total) by mouth daily. 90 tablet 1   ??? aspirin 81 MG chewable tablet Chew 81 mg once  daily.      ??? atorvastatin (LIPITOR) 40 MG tablet TAKE 1 TABLET EVERY DAY 90 tablet 3   ??? betamethasone dipropionate (DIPROLENE) 0.05 % ointment Apply to rash twice a day 90 g 3   ??? capecitabine (XELODA) 500 MG tablet TAKE 2 TABLETS (1000MG ) BY MOUTH TWICE DAILY FOR 14 DAYS ON THEN 7 DAYS OFF. 56 tablet 1   ??? carvedilol (COREG) 25 MG tablet TAKE 1 TABLET TWICE DAILY 180 tablet 3   ??? clonazePAM (KLONOPIN) 1 MG tablet Take 1 tablet (1 mg total) by mouth Two (2) times a day. 60 tablet 5   ??? fluconazole (DIFLUCAN) 200 MG tablet Take 2 tablets (400 mg total) by mouth once. for 1 dose 2 tablet 0   ??? fluconazole (DIFLUCAN) 200 MG tablet Take 2 tablets (400mg ) by mouth today and then Take 1 tablet (200 mg total) by mouth daily. for 13 days 15 tablet 0   ??? furosemide (LASIX) 20 MG tablet Take 1 tablet (20 mg total) by mouth daily as needed for swelling. 15 tablet 3   ??? gabapentin (NEURONTIN) 300 MG capsule TAKE 1 CAPSULE BY MOUTH 3 TIMES DAILY 270 capsule 3   ??? levETIRAcetam (KEPPRA) 500 MG tablet Take 2 tablets by mouth in the morning and 3 tablets at night. 450 tablet 4   ??? loperamide HCl (IMODIUM A-D ORAL) Take 6 tablets by mouth daily as needed.      ??? losartan (COZAAR) 100 MG tablet Take 1 tablet (100 mg total) by mouth daily. 90 tablet 3   ??? meTHIMazole (TAPAZOLE) 5 MG tablet Take 1 tablet (5 mg total) by mouth two (2) times a day. 180 tablet 1   ??? mometasone (ELOCON) 0.1 % cream Apply to left chest twice daily during radiation treatment 45 g 1   ??? potassium chloride (KLOR-CON) 10 MEQ CR tablet Take 1 tablet (10 mEq total) by mouth daily. Only take this if you are taking the furosemide (lasix) 30 tablet 11   ??? prochlorperazine (COMPAZINE) 10 MG tablet Take 10 mg by mouth every six (6) hours as needed for nausea.     ??? silver sulfaDIAZINE (SILVADENE, SSD) 1 % cream Apply to affected area daily 50 g 1     No current facility-administered medications for this visit.          ROS: Full 10 Points of Review of System are reviewed and negative except stated above in HPI      The patient was physically located in [place] or a state in which I am permitted to provide care. The patient and/or parent/gauardian understood that s/he may incur co-pays and cost sharing, and agreed to the telemedicine visit. The visit was completed via phone and/or video, which was appropriate and reasonable under the circumstances given the patient's presentation at the time.    The patient and/or parent/guardian has been advised of the potential risks and limitations of this mode of treatment (including, but not limited to, the absence of in-person examination) and has agreed to be treated using telemedicine. The patient's/patient's family's questions regarding telemedicine have been answered.     If the phone/video visit was completed in an ambulatory setting, the patient and/or parent/guardian has also been advised to contact their provider???s office for worsening conditions, and seek emergency medical treatment and/or call 911 if the patient deems either necessary.

## 2019-02-11 NOTE — Unmapped (Signed)
Phone Visit  Patient approved appointment change: yes  Patient demographics and insurance updated: yes  MSPQ complete: N/A  Instructions provided for phone visit: yes

## 2019-02-14 NOTE — Unmapped (Signed)
[facility] Refill Coordination Note    Specialty Medication(s) to be Shipped:   Hematology/Oncology: Capecitabine 500mg      Sonya Williams, DOB: 29-Apr-1951  Phone: [phone] (home)     All above HIPAA information was verified with patient.     Completed refill call assessment today to schedule patient's medication shipment from the [facility] [phone]).       Specialty medication(s) and dose(s) confirmed: Regimen is correct and unchanged.   Changes to medications: Mariena reports no changes reported at this time.  Changes to insurance: No  Questions for the pharmacist: No    Confirmed patient received Welcome Packet with first shipment. The patient will receive a drug information handout for each medication shipped and additional FDA Medication Guides as required.       DISEASE/MEDICATION-SPECIFIC INFORMATION        N/A    SPECIALTY MEDICATION ADHERENCE     Medication Adherence    Patient reported X missed doses in the last month:  0  Specialty Medication:  Capecitabine 500mg   Patient is on additional specialty medications:  No        Capecitabine 500 mg: 7 days of medicine on hand     SHIPPING     Shipping address confirmed in Epic.     Delivery Scheduled: Yes, Expected medication delivery date: 02/17/2019.     Medication will be delivered via Same Day Courier to the prescription address in Epic WAM.    Lusine Corlett P Allena Katz   [facility] Specialty Technician

## 2019-02-15 ENCOUNTER — Institutional Professional Consult (permissible substitution): Admit: 2019-02-15 | Discharge: 2019-02-16 | Payer: MEDICARE | Attending: Family | Primary: Family

## 2019-02-15 DIAGNOSIS — C50412 Malignant neoplasm of upper-outer quadrant of left female breast: Principal | ICD-10-CM

## 2019-02-15 DIAGNOSIS — Z171 Estrogen receptor negative status [ER-]: Secondary | ICD-10-CM

## 2019-02-15 NOTE — Unmapped (Signed)
BREAST MEDICAL ONCOLOGY   - Telephone Encounter -  (In lieu of in-person visit during COVID-19 pandemic).     Patient Name: Sonya Williams  Patient Age: 68 y.o.female  Encounter Date: 02/15/2019    Primary Care Provider:  Garnette Czech, MD    Referring Physician:  Felicity Coyer, MD    Breast Med/Onc:  Servando Salina, MD    PATIENT LOCATION DURING CALL:  HOME   OTHERS PRESENT DURING CALL: None  REASON FOR VISIT: Management of breast cancer.  CURRENT TREATMENT: Adjuvant Xeloda    ASSESSMENT   1. Malignant neoplasm of upper-outer quadrant of left breast in female, estrogen receptor negative (CMS-HCC)    ??This is a 68 y.o. year old female with a clinical stage IIIB (cT2cN1xM0),??infiltrating ductal carcinoma, grade 2, estrogen receptor negative, progesterone receptor negative and HER2 negativeebruary 2??of the left breast, diagnosed 11/03/2017.  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      She completed neoadjuvant weekly carboplatin with an AUC of 1.5, along with standard dosing paclitaxel at 80 mg/m2 for a planned total of 12 weeks of treatment. Only completed half the treatment prior to proceeding with surgical resection.  Treatment stopped because of hospitalization for diarrhea falls and seizures. She has significant residual cancer based on pathology from her July 2019 mastectomy with 1/8 nodes positive for disease.  Based on her RCB score she had an 80% chance of recurrence in 5 years without any further therapy, therefore she resumed therapy completing 4 cycles of dose dense AC without any major toxicity.  She is now status post radiation and started adjuvant Xeloda thousand milligrams twice daily and tolerating well, so will proceed to C#4 of Xeloda. We will continue to follow her closely given her comorbid conditions, but at this time she is tolerating Xeloda quite well without significant hand-foot syndrome, mucositis or worsening of her chronic diarrhea.    Cancer Staging  Malignant neoplasm of upper-outer quadrant of left breast in female, estrogen receptor negative (CMS-HCC)  Staging form: Breast, AJCC 8th Edition  - Clinical stage from 11/11/2017: Stage IIIB (cT2, cN1(f), cM0, G2, ER: Negative, PR: Negative, HER2: Equivocal) - Signed by Rudie Meyer, ANP on 11/11/2017  - Pathologic stage from 03/16/2018: No Stage Recommended (ypT1c, pN1a, cM0, G3, ER-, PR-, HER2-) - Signed by Marcy Panning, FNP on 06/02/2018      PLAN     -Treatment goals: Curative    -Systemic therapy: Started first cycle of Xeloda 12/13/2018; tolerating well with stable labs.  We will continue with treatment based on the rationale below as noted by Dr. Garlon Hatchet:  The patient does not meet the ideal criteria of the create X trial as she had only partial neoadjuvant therapy which was stopped for toxicity, then residual cancer and then 4 cycles of dose dense AC.  I am ambivalent this where the recommend Xeloda here in view of her persistent diarrhea and the major toxicities she had with Taxol and carboplatinum.  I am going to have our pharmacist call her and discuss the toxicities of treatment.  We might consider starting at a low dose but her diarrhea has been a major problem and its etiology remains unclear.  I discussed all these issues with the patient.   - Bone health: Last DEXA scan 02/27/17 showing T-scores in the osteoporosis range, however she was not prescribed any bisphosphonate therapy,  likely given her history of poor dentition.  Plan to repeat next bone density and August 2020 when she is due for mammogram.  - Genetics: Seen by Herbert Pun and no genetic testing recommended  - Smoking: has tried numerous methods and continues to smoke probably a little less than a pack a day.  Was referred to smoking cessation clinic previously.    -Complex partial seizures: Follows with neurology, last partial seizure 10/2018.  Followed-up with Dr. Loma Newton -02/10/2019, continues on Keppra, Clonazepam, Gabapentin  -Diarrhea: Diarrhea now resolved with cutting back on caffeine and BID Imodium.  Last colonoscopy was in 03/2019 with benign pathology.    DISPO:   --03/07/19 Rayvon Char FTF, labs in [facility]    [name], NP, MSN  Breast Oncology Division  Pager [phone]  [email or web address]    I spent 30 minutes on the phone with the patient. I spent an additional 10 minutes on pre- and post-visit activities.     The patient was physically located in [place] or a state in which I am permitted to provide care. The patient and/or parent/gauardian understood that s/he may incur co-pays and cost sharing, and agreed to the telemedicine visit. The visit was completed via phone and/or video, which was appropriate and reasonable under the circumstances given the patient's presentation at the time.    The patient and/or parent/guardian has been advised of the potential risks and limitations of this mode of treatment (including, but not limited to, the absence of in-person examination) and has agreed to be treated using telemedicine. The patient's/patient's family's questions regarding telemedicine have been answered.     If the phone/video visit was completed in an ambulatory setting, the patient and/or parent/guardian has also been advised to contact their provider???s office for worsening conditions, and seek emergency medical treatment and/or call 911 if the patient deems either necessary.    INTERVAL HISTORY     Interval History:   [name] 68 y.o. female being called today for management of breast cancer.      --Patient presents today for her 3-week follow-up currently on Xeloda.  States she is feeling well overall.    --Started her first cycle of Xeloda on 12/13/2018.  Taking 2 tablets twice daily as directed, 2 weeks on 1 week off.    --Denies any significant nausea, vomiting or weight loss, last weight was taken in house patient weighed 106 pounds 10/19/2018.  Denies any hand-foot syndrome, or mucositis.  Applying topical lubricants as directed, and performing salt water rinses 2-3 times per day.  --HPI unchanged 02/15/19    --Reports history of baseline diarrhea, resolved with cutting back on caffeine and taking BID Imodium.  She denies any blood in her stool and is actively followed by her PCP Dr. Graciela Husbands.   Recent f/u with GI specialist 01/14/2019, Dr. Maren Beach.  She had negative stool cultures collected most recently on 12/28/2018 for both C. difficile and a GI culture stool panel.  Last colonoscopy performed 03/02/2018, pathology was benign.  --HPI unchanged 02/15/19    --Reports no worsening of her partial seizures.  She is followed closely by her neurologist Dr. Loma Newton, last F/U 02/10/19  Last seizure was in February 2020 lasted 8 seconds she was at home and well supported by her sister any cares per her and is her POA.  She denies any recent changes in her seizure history medication.    --She denies any chest pain, DOE or palpitations.  Occasionally she says she has some swelling in her  lower extremities.  Today she has not noticed any swelling.  She continues to stay active at home performing her ADLs such as sweeping, washing clothes, and cooking when she is able.  She denies any recent falls in the past 6 months.  --HPI unchanged 02/15/19    --Performance status 1 due to nonbreast cancer issues such as diarrhea      ROS (bolded to indicate positive)   Symptom Notes   Appetite/Fatigue/Fever/Chills    Constipation/Diarrhea/Nausea/Vomiting  X--see above   Pain    Hot flashes    Anxiety/Depression/Sleep changes    Cough/SOB    Itching/Rash    Peripheral neuropathy/Swelling  X--see above   Headache    Urinary complaints    Sexual dysfunction/Vaginal dryness    Other  X--see above       HEMATOLOGICAL / ONCOLOGICAL HISTORY:        Malignant neoplasm of upper-outer quadrant of left breast in female, estrogen receptor negative (CMS-HCC)    10/2017 -  Presenting Symptoms     Patient self-palpated left breast mass prompting Dx imaging.      10/28/2017 Interval Scan(s)     B/L Dx MMG: Left breast ~3.1 x 2.8 cm mass w/calcs UOQ. Grouped coarse heterogeneous calcifications in the upper outer quadrant of the right breast measuring 0.6 cm.   [place]: Left breast 2:00 position, 6CFN mass 2.8 x 1.3 x 2.6 cm with internal calcifications. There is an adjacent ill-defined hypoechoic area next to the primary mass measuring approximately 2.3 x 0.7 cm which may represent an extension of the mass versus satellite lesion.    [place]: Left axilla abnormal lymph node or mass measuring 2.0 x 0.9 x 1.2 cm. Multiple other normal-appearing lymph nodes are demonstrated.    Targeted diagnostic ultrasound of the right breast in the upper-outer quadrant performed to further evaluate calcifications demonstrated on the mammogram due to the patient's difficulty with tolerating a possible stereotactic guided biopsy. The calcifications were not demonstrated sonographically. Normal breast parenchyma was demonstrated.      11/03/2017 Biopsy     Rt breast SBx: fibroadenoma w/calcs  Lt breast CBx: IDC, G2, microcalcs a/w carcinoma  Lt axillary LN: + metastatic adenoCA    Receptors: ER- PR- Her2 Eq 2+ FISH NON-amplified.        11/20/2017 Interval Scan(s)     PET  --Intensely FDG avid left breast invasive ductal carcinoma with intensely avid left axillary biopsy proven nodal metastasis.  -- Moderately avid right breast mass is superior/anterior to the right breast clip, likely small hemorrhage and inflammation given recent stereotactic biopsy. Mild right axillary uptake is likely reactive.   -- No evidence of distant metastatic disease.  -- Enlarged, heterogeneous thyroid without abnormal FDG uptake. Findings compatible with multinodular goiter, as was described on thyroid ultrasound 05/01/2010.       Other     Carbo/Taxol  - C1/12 12/21/17. Carbo AUC 1, paclitaxel 80/m2.  Breast mass responding well to treatment.  - C3/12: 01/04/18 Carbo escalated to 1.5 AUC, paclitaxel 80 mg/m?? some decrease in hemoglobin.    - C4/12:  In view of ANC 1.2 decrease carbo back to AUC 1.0 this week and next  - C6/12: 01/27/18 HOLD Carbo due to Cr 1.75, only paclitaxel 80 mg/m2 given  - C7/12: 02/03/18 HOLD carbo and paclitaxel due to ANC 900. At this point, doubt that she will tolerate additional chemotherapy given her multiple medical co-morbidities      12/01/2017 - 01/31/2018 Chemotherapy  Chemotherapy Treatment    Treatment Goal Curative   Line of Treatment [No plan line of treatment]   Plan Name OP BREAST PACLItaxel WEEKLY / CARBOplatin EVERY 3 WEEKS   Start Date 12/21/2017   End Date 01/27/2018   Provider Fayne Norrie Muss, MD   Chemotherapy dexamethasone (DECADRON) tablet 20 mg, 20 mg, Oral, Once, 2 of 4 cycles  Administration: 20 mg (12/21/2017), 20 mg (12/28/2017), 20 mg (01/04/2018), 20 mg (01/11/2018), 20 mg (01/18/2018), 20 mg (01/27/2018)  CARBOplatin (PARAPLATIN) 117.6 mg in sodium chloride (NS) 0.9 % 100 mL IVPB, 117.6 mg (92.7 % of original dose 126.9 mg), Intravenous, Once, 2 of 4 cycles  Dose modification:   (original dose 126.9 mg, Cycle 1)  Administration: 74.8 mg (12/21/2017), 89.1 mg (12/28/2017), 123 mg (01/04/2018), 85.3 mg (01/11/2018), 76.8 mg (01/18/2018)  PACLItaxel (TAXOL) 126.42 mg in sodium chloride 0.9% NON-PVC (NS) 0.9 % 250 mL IVPB, 80 mg/m2 = 126.42 mg, Intravenous, Once, 2 of 4 cycles  Administration: 126.42 mg (12/21/2017), 126.42 mg (12/28/2017), 126.42 mg (01/04/2018), 126.42 mg (01/11/2018), 126.42 mg (01/18/2018), 126.42 mg (01/27/2018)         03/16/2018 Surgery     Left mastectomy+ALND: IDC, T=51mm, G3, Probable to Definite response to NACT. 1/18 (macromets=4mm) RCB: II-3.235      03/16/2018 -  Cancer Staged     Staging form: Breast, AJCC 8th Edition  - Pathologic stage from 03/16/2018: No Stage Recommended (ypT1c, pN1a, cM0, G3, ER-, PR-, HER2-) - Signed by Marcy Panning, FNP on 06/02/2018        06/02/2018 - 07/13/2018 Chemotherapy     OP BREAST AC (DOSE DENSE) x 4 cycles  DOXOrubicin 60 mg/m2, Cyclophosphamide 600 mg/m2 every 14 days        08/11/2018 - 09/27/2018 Radiation     approximate date completions. Dr. Carles Collet [facility]      12/13/2018 -  Chemotherapy     Capecitabine 1000 mg po BID x 14 days then 7 days off  Start date: 12/13/18  Pharmacy:  [facility]   Cycle 2 start date: 01/03/19        Invasive ductal carcinoma of breast, female, left (CMS-HCC)    12/21/2017 - 01/27/2018 Chemotherapy     Taxol Carbo with major toxicity         OTHER PAST MEDICAL HISTORY:     Patient Active Problem List   Diagnosis   ??? HTN   ??? Pure hypercholesterolemia (RAF-HCC)   ??? Vitamin D deficiency   ??? Tobacco use     ??? Goiter (RAF-HCC)   ??? Problem with medical care compliance   ??? Coronary Artery Disease s/p PCI to RCA   ??? Situational depression   ??? Heart failure (CMS-HCC)   ??? Focal epilepsy with impairment of consciousness, intractable (CMS-HCC)   ??? Malignant neoplasm of upper-outer quadrant of left breast in female, estrogen receptor negative (CMS-HCC)   ??? Invasive ductal carcinoma of breast, female, left (CMS-HCC)   ??? Falls frequently   ??? Diarrhea   ??? 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 Anemia   ??? Anemia in neoplastic disease       ALLERGIES:     Iodinated contrast media and Other    MEDICATIONS:       Outpatient Encounter Medications as of 02/15/2019   Medication Sig Dispense Refill   ??? aspirin 81 MG chewable tablet Chew 81 mg once daily.      ???  atorvastatin (LIPITOR) 40 MG tablet TAKE 1 TABLET EVERY DAY 90 tablet 3   ??? capecitabine (XELODA) 500 MG tablet TAKE 2 TABLETS (1000MG ) BY MOUTH TWICE DAILY FOR 14 DAYS ON THEN 7 DAYS OFF. 56 tablet 1   ??? carvedilol (COREG) 25 MG tablet TAKE 1 TABLET TWICE DAILY 180 tablet 3   ??? clonazePAM (KLONOPIN) 1 MG tablet Take 1 tablet (1 mg total) by mouth two (2) times a day. 60 tablet 5   ??? gabapentin (NEURONTIN) 300 MG capsule TAKE 1 CAPSULE BY MOUTH 3 TIMES DAILY 270 capsule 3   ??? levETIRAcetam (KEPPRA) 500 MG tablet Take 2 tablets by mouth in the morning and 3 tablets at night. 450 tablet 4   ??? losartan (COZAAR) 100 MG tablet Take 1 tablet (100 mg total) by mouth daily. 90 tablet 3   ??? meTHIMazole (TAPAZOLE) 5 MG tablet Take 1 tablet (5 mg total) by mouth two (2) times a day. 180 tablet 1   ??? mometasone (ELOCON) 0.1 % cream Apply to left chest twice daily during radiation treatment 45 g 1   ??? potassium chloride (KLOR-CON) 10 MEQ CR tablet Take 1 tablet (10 mEq total) by mouth daily. Only take this if you are taking the furosemide (lasix) 30 tablet 11   ??? acetaminophen (TYLENOL) 500 MG tablet Take 500 mg by mouth every four (4) hours as needed for pain.     ??? amLODIPine (NORVASC) 10 MG tablet Take 1 tablet (10 mg total) by mouth daily. (Patient not taking: Reported on 02/15/2019) 90 tablet 1   ??? betamethasone dipropionate (DIPROLENE) 0.05 % ointment Apply to rash twice a day (Patient not taking: Reported on 02/15/2019) 90 g 3   ??? fluconazole (DIFLUCAN) 200 MG tablet Take 2 tablets (400 mg total) by mouth once. for 1 dose 2 tablet 0   ??? fluconazole (DIFLUCAN) 200 MG tablet Take 2 tablets (400mg ) by mouth today and then Take 1 tablet (200 mg total) by mouth daily. for 13 days 15 tablet 0   ??? furosemide (LASIX) 20 MG tablet Take 1 tablet (20 mg total) by mouth daily as needed for swelling. 15 tablet 3   ??? loperamide HCl (IMODIUM A-D ORAL) Take 6 tablets by mouth daily as needed.      ??? prochlorperazine (COMPAZINE) 10 MG tablet Take 10 mg by mouth every six (6) hours as needed for nausea.     ??? silver sulfaDIAZINE (SILVADENE, SSD) 1 % cream Apply to affected area daily (Patient not taking: Reported on 02/15/2019) 50 g 1   ??? [DISCONTINUED] phenytoin (DILANTIN) 100 MG ER capsule Take 3 capsules (300 mg total) by mouth nightly. 270 capsule 3     No facility-administered encounter medications on file as of 02/15/2019.      Physical Exam - limited exam due to telemedicine visit.  Gen: Well appearing female in NAD.  Lungs: Unlabored breathing.  Neuro: Alert and oriented.  Speech fluent.

## 2019-02-15 NOTE — Unmapped (Signed)
It was a pleasure seeing you today.   Continue with the current plan.  Follow-up in 3 weeks.     Labs from today if done:  Appointment on 01/21/2019   Component Date Value Ref Range Status   ??? Sodium 01/21/2019 142  135 - 145 mmol/L Final   ??? Potassium 01/21/2019 3.6  3.5 - 5.0 mmol/L Final   ??? Chloride 01/21/2019 108* 98 - 107 mmol/L Final   ??? Anion Gap 01/21/2019 7  7 - 15 mmol/L Final   ??? CO2 01/21/2019 27.0  22.0 - 30.0 mmol/L Final   ??? BUN 01/21/2019 26* 7 - 21 mg/dL Final   ??? Creatinine 01/21/2019 1.01* 0.60 - 1.00 mg/dL Final   ??? BUN/Creatinine Ratio 01/21/2019 26   Final   ??? EGFR CKD-EPI Non-African American,* 01/21/2019 58* >=60 mL/min/1.71m2 Final   ??? EGFR CKD-EPI African American, Fem* 01/21/2019 67  >=60 mL/min/1.71m2 Final   ??? Glucose 01/21/2019 94  70 - 179 mg/dL Final   ??? Calcium 16/06/9603 9.6  8.5 - 10.2 mg/dL Final   ??? Albumin 54/05/8118 4.2  3.5 - 5.0 g/dL Final   ??? Total Protein 01/21/2019 7.2  6.5 - 8.3 g/dL Final   ??? Total Bilirubin 01/21/2019 0.5  0.0 - 1.2 mg/dL Final   ??? AST 14/78/2956 18  14 - 38 U/L Final   ??? ALT 01/21/2019 8  <35 U/L Final   ??? Alkaline Phosphatase 01/21/2019 75  38 - 126 U/L Final   ??? WBC 01/21/2019 3.9* 4.5 - 11.0 10*9/L Final   ??? RBC 01/21/2019 3.08* 4.00 - 5.20 10*12/L Final   ??? HGB 01/21/2019 10.2* 13.5 - 16.0 g/dL Final   ??? HCT 21/30/8657 31.8* 36.0 - 46.0 % Final   ??? MCV 01/21/2019 103.2* 80.0 - 100.0 fL Final   ??? MCH 01/21/2019 33.1  26.0 - 34.0 pg Final   ??? MCHC 01/21/2019 32.1  31.0 - 37.0 g/dL Final   ??? RDW 84/69/6295 19.3* 12.0 - 15.0 % Final   ??? MPV 01/21/2019 8.5  7.0 - 10.0 fL Final   ??? Platelet 01/21/2019 156  150 - 440 10*9/L Final   ??? Neutrophils % 01/21/2019 66.5  % Final   ??? Lymphocytes % 01/21/2019 16.1  % Final   ??? Monocytes % 01/21/2019 7.0  % Final   ??? Eosinophils % 01/21/2019 6.2  % Final   ??? Basophils % 01/21/2019 0.6  % Final   ??? Absolute Neutrophils 01/21/2019 2.6  2.0 - 7.5 10*9/L Final   ??? Absolute Lymphocytes 01/21/2019 0.6* 1.5 - 5.0 10*9/L Final   ??? Absolute Monocytes 01/21/2019 0.3  0.2 - 0.8 10*9/L Final   ??? Absolute Eosinophils 01/21/2019 0.2  0.0 - 0.4 10*9/L Final   ??? Absolute Basophils 01/21/2019 0.0  0.0 - 0.1 10*9/L Final   ??? Large Unstained Cells 01/21/2019 4  0 - 4 % Final   ??? Macrocytosis 01/21/2019 Marked* Not Present Final   ??? Anisocytosis 01/21/2019 Moderate* Not Present Final   ??? Smear Review Comments 01/21/2019 See Comment* Undefined Final    Smear Reviewed.         Future Appointments:  Future Appointments   Date Time Provider Department Center   04/06/2019 11:15 AM [facility] MAMMO RM 1 [facility] [place]   04/06/2019  1:30 PM Alan Ripper, MD [facility]   04/06/2019  3:00 PM Aris Everts, MD [facility]   04/08/2019  2:20 PM Felicity Coyer, MD [facility]       Please call our Nurse Navigator: Lucendia Herrlich, [phone] if you have any interval questions or concerns:    For appointments & questions Monday through Friday 8 AM??? 5 PM   please call [phone] or Toll free [phone].    On Nights, Weekends and Holidays  Call [phone] and ask for the oncologist on call.    [facility]  [address]  [place], [place] [address]  [email or web address]    FOR CAREGIVERS: Follow this link for excellent information on care giving. LookLarge.fr    EXERCISE AND NUTRITION: It's important that you exercise to keep your muscles strong.  Walking is an excellent exercise and if you're not doing so we recommend that you walk 30 minutes or more a day - 5 times a week.  This is an excellent way to keep fit.    We recommend that you eat a healthy American diet with adequate servings of fruits and vegetables.  It's important to be aware of how many calories you were taking in each day so that you can maintain a reasonable body weight. If you are having trouble with your diet or with weight control please let us know and we can refer you to our nutritionist who can give you advice and help. This website may also be helpful DiscoHelp.si.    HERBS AND SUPPLEMENTS:  If you are taking herbs and supplements please use the following website to determine their safety: PhotoConference.no    BONE HEALTH: We recommend taking Vitamin D 1000 units per day and 1200 mg of calcium per day. You can get more information on calcium intake by Googling: Calcium NIH, or going to the following site: https://ods.CakeDeveloper.com.cy. Exercising is also most important. Some patients may need medication for bone health and your doctor may discuss this with you.    GENERAL SURVEILLANCE PLAN:These are the national guidelines from ASCO and NCCN. The recommendations for follow-up care for breast cancer include regular physical examinations, mammograms, and breast self-examinations. The follow-up care may be provided by your oncologist or primary care doctor, as long as your primary care doctor has communicated with your oncologist about appropriate follow-up care. In addition, patients with a possible or known family history of breast cancer should be referred to a Dentist.    ? Visit your doctor every three to six months for the first three years after the first treatment, every six to 12 months for years four and five, and every year thereafter.    ? Schedule a mammogram one year after your first mammogram that led to diagnosis, but no earlier than six months after radiation therapy. Obtain a mammogram every six to 12 months thereafter.    ? Perform a breast self-examination every month. This procedure is not a substitute for a mammogram.    ? Continue to visit a gynecologist regularly. Women taking tamoxifen should report any vaginal bleeding to their doctor.    In addition to the plan outlined above, please call us if you experience:   ? New lumps in the breast   ? Bone pain   ? Chest pain   ? Abdominal pain   ? Shortness of breath or difficulty breathing   ? Persistent headaches   ? Persistent coughing   ? Rash on breast   ? Nipple discharge (liquid coming from the nipple)     For emergencies, evenings or weekends, please call [phone] and ask for oncology fellow on call. Reasons to  call emergency for patients receiving chemotherapy may include:    Fever of 100.5 or greater  Nausea and/or vomiting not relieved with nausea medicine  Diarrhea or constipation not relieved with bowel regimen  Severe pain not relieved with usual pain regimen

## 2019-02-16 MED ORDER — CAPECITABINE 500 MG TABLET
ORAL_TABLET | 1 refills | 0 days | Status: CP
Start: 2019-02-16 — End: 2019-04-01
  Filled 2019-02-17: qty 56, 21d supply, fill #0

## 2019-02-16 NOTE — Unmapped (Signed)
AOC Triage Note     Patient: Sonya Williams     Reason for call:  return call    Time call returned: [phone]     Phone Assessment: pt calling to see if she is supposed to start her Xeloda back on 6/22 as directed.  She has not heard from the pharmacy saying that they were sending it out yet     Triage Recommendations: RN instructed pt that per visit notes, she would be continuing taking her Xeloda as previously doing.  RN will reach out to team about med refill     Caller Response: appreciation     Outstanding tasks: notify team of request

## 2019-02-16 NOTE — Unmapped (Signed)
Hi,     [name] contacted the [employer] requesting to speak with the care team of [name] to discuss:    Patient needs to know if she should restart Xeloda on 02/21/19.    Please contact Ms. Mario at [phone].      Check Indicates criteria has been reviewed and confirmed with the patient:    []  Preferred Name   []  DOB and/or MR#  []  Preferred Contact Method  []  Phone Number(s)   []  MyChart     Thank you,   Kelli Hope  [facility]   [phone]

## 2019-02-17 MED FILL — CAPECITABINE 500 MG TABLET: 21 days supply | Qty: 56 | Fill #0 | Status: AC

## 2019-02-18 MED FILL — CLONAZEPAM 1 MG TABLET: 30 days supply | Qty: 60 | Fill #0 | Status: AC

## 2019-03-01 NOTE — Unmapped (Signed)
Error

## 2019-03-01 NOTE — Unmapped (Signed)
[facility] Refill Coordination Note    Specialty Medication(s) to be Shipped:   Hematology/Oncology: Capecitabine 500mg     Other medication(s) to be shipped: none     Sonya Williams, DOB: 1950/12/22  Phone: [phone] (home)       All above HIPAA information was verified with patient.     Completed refill call assessment today to schedule patient's medication shipment from the [facility] [phone]).       Specialty medication(s) and dose(s) confirmed: Regimen is correct and unchanged.   Changes to medications: Sonya Williams reports no changes at this time.  Changes to insurance: No  Questions for the pharmacist: No    Confirmed patient received Welcome Packet with first shipment. The patient will receive a drug information handout for each medication shipped and additional FDA Medication Guides as required.       DISEASE/MEDICATION-SPECIFIC INFORMATION        N/A    SPECIALTY MEDICATION ADHERENCE     Medication Adherence    Patient reported X missed doses in the last month:  0  Specialty Medication:  xeloda 500mg   Patient is on additional specialty medications:  No                Capecitabine 500 mg: 5 days of medicine on hand          SHIPPING     Shipping address confirmed in Epic.     Delivery Scheduled: Yes, Expected medication delivery date: 03/10/19.     Medication will be delivered via Next Day Courier to the prescription address in Epic WAM.    Unk Lightning   [facility] Specialty Technician

## 2019-03-07 ENCOUNTER — Ambulatory Visit: Admit: 2019-03-07 | Discharge: 2019-03-07 | Payer: MEDICARE

## 2019-03-07 ENCOUNTER — Ambulatory Visit: Admit: 2019-03-07 | Discharge: 2019-03-07 | Payer: MEDICARE | Attending: Family | Primary: Family

## 2019-03-07 DIAGNOSIS — C50412 Malignant neoplasm of upper-outer quadrant of left female breast: Principal | ICD-10-CM

## 2019-03-07 DIAGNOSIS — Z171 Estrogen receptor negative status [ER-]: Secondary | ICD-10-CM

## 2019-03-07 LAB — CBC W/ AUTO DIFF
BASOPHILS ABSOLUTE COUNT: 0 10*9/L (ref 0.0–0.1)
BASOPHILS RELATIVE PERCENT: 0.4 %
EOSINOPHILS ABSOLUTE COUNT: 0.3 10*9/L (ref 0.0–0.4)
EOSINOPHILS RELATIVE PERCENT: 9.5 %
HEMOGLOBIN: 10.2 g/dL — ABNORMAL LOW (ref 13.5–16.0)
LARGE UNSTAINED CELLS: 3 % (ref 0–4)
LYMPHOCYTES ABSOLUTE COUNT: 0.6 10*9/L — ABNORMAL LOW (ref 1.5–5.0)
LYMPHOCYTES RELATIVE PERCENT: 19.4 %
MEAN CORPUSCULAR HEMOGLOBIN CONC: 31.5 g/dL (ref 31.0–37.0)
MEAN CORPUSCULAR HEMOGLOBIN: 36.6 pg — ABNORMAL HIGH (ref 26.0–34.0)
MEAN CORPUSCULAR VOLUME: 116.4 fL — ABNORMAL HIGH (ref 80.0–100.0)
MEAN PLATELET VOLUME: 8.3 fL (ref 7.0–10.0)
MONOCYTES ABSOLUTE COUNT: 0.3 10*9/L (ref 0.2–0.8)
NEUTROPHILS ABSOLUTE COUNT: 1.9 10*9/L — ABNORMAL LOW (ref 2.0–7.5)
NEUTROPHILS RELATIVE PERCENT: 59.7 %
PLATELET COUNT: 110 10*9/L — ABNORMAL LOW (ref 150–440)
RED BLOOD CELL COUNT: 2.8 10*12/L — ABNORMAL LOW (ref 4.00–5.20)
RED CELL DISTRIBUTION WIDTH: 20.9 % — ABNORMAL HIGH (ref 12.0–15.0)
WBC ADJUSTED: 3.1 10*9/L — ABNORMAL LOW (ref 4.5–11.0)

## 2019-03-07 LAB — BILIRUBIN TOTAL: Bilirubin:MCnc:Pt:Ser/Plas:Qn:: 0.8

## 2019-03-07 LAB — COMPREHENSIVE METABOLIC PANEL
ALBUMIN: 4 g/dL (ref 3.5–5.0)
ALKALINE PHOSPHATASE: 78 U/L (ref 38–126)
ANION GAP: 8 mmol/L (ref 7–15)
AST (SGOT): 20 U/L (ref 14–38)
BILIRUBIN TOTAL: 0.8 mg/dL (ref 0.0–1.2)
BLOOD UREA NITROGEN: 26 mg/dL — ABNORMAL HIGH (ref 7–21)
BUN / CREAT RATIO: 24
CALCIUM: 9.1 mg/dL (ref 8.5–10.2)
CHLORIDE: 110 mmol/L — ABNORMAL HIGH (ref 98–107)
CO2: 24 mmol/L (ref 22.0–30.0)
CREATININE: 1.09 mg/dL — ABNORMAL HIGH (ref 0.60–1.00)
EGFR CKD-EPI AA FEMALE: 61 mL/min/{1.73_m2} (ref >=60)
GLUCOSE RANDOM: 91 mg/dL (ref 70–179)
POTASSIUM: 3.7 mmol/L (ref 3.5–5.0)
PROTEIN TOTAL: 7 g/dL (ref 6.5–8.3)
SODIUM: 142 mmol/L (ref 135–145)

## 2019-03-07 LAB — SLIDE REVIEW

## 2019-03-07 LAB — NEUTROPHILS RELATIVE PERCENT: Lab: 59.7

## 2019-03-07 LAB — POIKILOCYTES

## 2019-03-07 NOTE — Unmapped (Signed)
It was a pleasure seeing you today.   Continue with the current plan.  Follow-up in 4 weeks.     Labs from today if done:  Appointment on 03/07/2019   Component Date Value Ref Range Status   ??? Sodium 03/07/2019 142  135 - 145 mmol/L Final   ??? Potassium 03/07/2019 3.7  3.5 - 5.0 mmol/L Final   ??? Chloride 03/07/2019 110* 98 - 107 mmol/L Final   ??? Anion Gap 03/07/2019 8  7 - 15 mmol/L Final   ??? CO2 03/07/2019 24.0  22.0 - 30.0 mmol/L Final   ??? BUN 03/07/2019 26* 7 - 21 mg/dL Final   ??? Creatinine 03/07/2019 1.09* 0.60 - 1.00 mg/dL Final   ??? BUN/Creatinine Ratio 03/07/2019 24   Final   ??? EGFR CKD-EPI Non-African American,* 03/07/2019 53* >=60 mL/min/1.20m2 Final   ??? EGFR CKD-EPI African American, Fem* 03/07/2019 61  >=60 mL/min/1.79m2 Final   ??? Glucose 03/07/2019 91  70 - 179 mg/dL Final   ??? Calcium 16/06/9603 9.1  8.5 - 10.2 mg/dL Final   ??? Albumin 54/05/8118 4.0  3.5 - 5.0 g/dL Final   ??? Total Protein 03/07/2019 7.0  6.5 - 8.3 g/dL Final   ??? Total Bilirubin 03/07/2019 0.8  0.0 - 1.2 mg/dL Final   ??? AST 14/78/2956 20  14 - 38 U/L Final   ??? ALT 03/07/2019 9  <35 U/L Final   ??? Alkaline Phosphatase 03/07/2019 78  38 - 126 U/L Final   ??? WBC 03/07/2019 3.1* 4.5 - 11.0 10*9/L Final   ??? RBC 03/07/2019 2.80* 4.00 - 5.20 10*12/L Final   ??? HGB 03/07/2019 10.2* 13.5 - 16.0 g/dL Final   ??? HCT 21/30/8657 32.6* 36.0 - 46.0 % Final   ??? MCV 03/07/2019 116.4* 80.0 - 100.0 fL Final   ??? MCH 03/07/2019 36.6* 26.0 - 34.0 pg Final   ??? MCHC 03/07/2019 31.5  31.0 - 37.0 g/dL Final   ??? RDW 84/69/6295 20.9* 12.0 - 15.0 % Final   ??? MPV 03/07/2019 8.3  7.0 - 10.0 fL Final   ??? Platelet 03/07/2019 110* 150 - 440 10*9/L Final   ??? Neutrophils % 03/07/2019 59.7  % Final   ??? Lymphocytes % 03/07/2019 19.4  % Final   ??? Monocytes % 03/07/2019 7.9  % Final   ??? Eosinophils % 03/07/2019 9.5  % Final   ??? Basophils % 03/07/2019 0.4  % Final   ??? Absolute Neutrophils 03/07/2019 1.9* 2.0 - 7.5 10*9/L Final   ??? Absolute Lymphocytes 03/07/2019 0.6* 1.5 - 5.0 10*9/L Final   ??? Absolute Monocytes 03/07/2019 0.3  0.2 - 0.8 10*9/L Final   ??? Absolute Eosinophils 03/07/2019 0.3  0.0 - 0.4 10*9/L Final   ??? Absolute Basophils 03/07/2019 0.0  0.0 - 0.1 10*9/L Final   ??? Large Unstained Cells 03/07/2019 3  0 - 4 % Final   ??? Macrocytosis 03/07/2019 Marked* Not Present Final   ??? Anisocytosis 03/07/2019 Moderate* Not Present Final   ??? Hypochromasia 03/07/2019 Moderate* Not Present Final        Future Appointments:  Future Appointments   Date Time Provider Department Center   03/07/2019 11:30 AM Marcy Panning, FNP [facility]   04/06/2019 11:15 AM [facility] RM 1 [facility] [place]   04/06/2019  1:30 PM Alan Ripper, MD [facility]   04/06/2019  3:00 PM Aris Everts, MD [facility]   04/08/2019  2:20 PM Felicity Coyer, MD [facility]  [facility]       Please call our Nurse Navigator: Lucendia Herrlich, [phone] if you have any interval questions or concerns:    For appointments & questions Monday through Friday 8 AM??? 5 PM   please call [phone] or Toll free [phone].    On Nights, Weekends and Holidays  Call [phone] and ask for the oncologist on call.    [facility]  [address]  [place], [place] [address]  [email or web address]    FOR CAREGIVERS: Follow this link for excellent information on care giving. LookLarge.fr    EXERCISE AND NUTRITION: It's important that you exercise to keep your muscles strong.  Walking is an excellent exercise and if you're not doing so we recommend that you walk 30 minutes or more a day - 5 times a week.  This is an excellent way to keep fit.    We recommend that you eat a healthy American diet with adequate servings of fruits and vegetables.  It's important to be aware of how many calories you were taking in each day so that you can maintain a reasonable body weight. If you are having trouble with your diet or with weight control please let us know and we can refer you to our nutritionist who can give you advice and help. This website may also be helpful DiscoHelp.si.    HERBS AND SUPPLEMENTS:  If you are taking herbs and supplements please use the following website to determine their safety: PhotoConference.no    BONE HEALTH: We recommend taking Vitamin D 1000 units per day and 1200 mg of calcium per day. You can get more information on calcium intake by Googling: Calcium NIH, or going to the following site: https://ods.CakeDeveloper.com.cy. Exercising is also most important. Some patients may need medication for bone health and your doctor may discuss this with you.    GENERAL SURVEILLANCE PLAN:These are the national guidelines from ASCO and NCCN. The recommendations for follow-up care for breast cancer include regular physical examinations, mammograms, and breast self-examinations. The follow-up care may be provided by your oncologist or primary care doctor, as long as your primary care doctor has communicated with your oncologist about appropriate follow-up care. In addition, patients with a possible or known family history of breast cancer should be referred to a Dentist.    ? Visit your doctor every three to six months for the first three years after the first treatment, every six to 12 months for years four and five, and every year thereafter.    ? Schedule a mammogram one year after your first mammogram that led to diagnosis, but no earlier than six months after radiation therapy. Obtain a mammogram every six to 12 months thereafter.    ? Perform a breast self-examination every month. This procedure is not a substitute for a mammogram.    ? Continue to visit a gynecologist regularly. Women taking tamoxifen should report any vaginal bleeding to their doctor.    In addition to the plan outlined above, please call us if you experience:   ? New lumps in the breast   ? Bone pain   ? Chest pain   ? Abdominal pain   ? Shortness of breath or difficulty breathing   ? Persistent headaches   ? Persistent coughing   ? Rash on breast   ? Nipple discharge (liquid coming from the nipple)     For emergencies, evenings or weekends, please call [phone] and ask for oncology fellow on call. Reasons  to call emergency for patients receiving chemotherapy may include:    Fever of 100.5 or greater  Nausea and/or vomiting not relieved with nausea medicine  Diarrhea or constipation not relieved with bowel regimen  Severe pain not relieved with usual pain regimen

## 2019-03-07 NOTE — Unmapped (Signed)
BREAST MEDICAL ONCOLOGY RETURN PROGRESS NOTE    Patient Name: Sonya Williams  Patient Age: 68 y.o.female  Encounter Date: 03/07/2019    Primary Care Provider:  Garnette Czech, MD    Referring Physician:  Felicity Coyer, MD    Breast Med/Onc:  Servando [place], MD    PATIENT LOCATION DURING CALL:  HOME   OTHERS PRESENT DURING CALL: None  REASON FOR VISIT: Management of breast cancer.  CURRENT TREATMENT: Adjuvant Xeloda    ASSESSMENT   1. Malignant neoplasm of upper-outer quadrant of left breast in female, estrogen receptor negative (CMS-HCC)    ??This is a 68 y.o. year old female with a clinical stage IIIB (cT2cN1xM0),??infiltrating ductal carcinoma, grade 2, estrogen receptor negative, progesterone receptor negative and HER2 negativeebruary 2??of the left breast, diagnosed 11/03/2017.  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      She completed neoadjuvant weekly carboplatin with an AUC of 1.5, along with standard dosing paclitaxel at 80 mg/m2 for a planned total of 12 weeks of treatment. Only completed half the treatment prior to proceeding with surgical resection.  Treatment stopped because of hospitalization for diarrhea falls and seizures. She has significant residual cancer based on pathology from her July 2019 mastectomy with 1/8 nodes positive for disease.  Based on her RCB score she had an 80% chance of recurrence in 5 years without any further therapy, therefore she resumed therapy completing 4 cycles of dose dense AC without any major toxicity.  She is now status post radiation and started adjuvant Xeloda thousand milligrams twice daily and tolerating well, so will proceed to the next cycle of Xeloda. We will continue to follow her closely given her comorbid conditions, but at this time she is tolerating Xeloda quite well without significant hand-foot syndrome, mucositis or diarrhea.    Cancer Staging Malignant neoplasm of upper-outer quadrant of left breast in female, estrogen receptor negative (CMS-HCC)  Staging form: Breast, AJCC 8th Edition  - Clinical stage from 11/11/2017: Stage IIIB (cT2, cN1(f), cM0, G2, ER: Negative, PR: Negative, HER2: Equivocal) - Signed by Rudie Meyer, ANP on 11/11/2017  - Pathologic stage from 03/16/2018: No Stage Recommended (ypT1c, pN1a, cM0, G3, ER-, PR-, HER2-) - Signed by Marcy Panning, FNP on 06/02/2018      PLAN     -Treatment goals: Curative    -Systemic therapy: Started first cycle of Xeloda 12/13/2018; tolerating well with stable labs.  We will continue with treatment based on the rationale below as noted by Dr. Garlon Hatchet:  The patient does not meet the ideal criteria of the create X trial as she had only partial neoadjuvant therapy which was stopped for toxicity, then residual cancer and then 4 cycles of dose dense AC.  I am ambivalent this where the recommend Xeloda here in view of her persistent diarrhea and the major toxicities she had with Taxol and carboplatinum.  I am going to have our pharmacist call her and discuss the toxicities of treatment.  We might consider starting at a low dose but her diarrhea has been a major problem and its etiology remains unclear.  I discussed all these issues with the patient.   - Bone health: Last DEXA scan 02/27/17 showing T-scores in the osteoporosis range, however she was not prescribed any bisphosphonate therapy, likely given her history of poor dentition.  Plan to repeat next bone density in 2020.  -  Genetics: Seen by Herbert Pun and no genetic testing recommended  - Smoking: has tried numerous methods and continues to smoke probably a little less than a pack a day.  Was referred to smoking cessation clinic previously.    -Complex partial seizures: Follows with neurology, last partial seizure 10/2018.  Followed-up with Dr. Loma Newton -02/10/2019, continues on Keppra, Clonazepam, Gabapentin  -Diarrhea: Diarrhea now resolved with cutting back on caffeine and BID Imodium.  Last colonoscopy was in 03/2019 with benign pathology.    DISPO:   --03/28/19 Rayvon Char FTF, labs in [facility]    I spent at least 25 minutes with this patient more than 50% in counseling and care co-ordination.      [name], NP, MSN  Breast Oncology Division  Pager [phone]  [email or web address]    INTERVAL HISTORY     Interval History:   [name] 68 y.o. female being called today for management of breast cancer.      --Patient presents today for her 4 week follow-up currently on Xeloda.  States she is feeling well overall.    --Started her first cycle of Xeloda on 12/13/2018.  Taking 2 tablets twice daily as directed, 2 weeks on 1 week off.  Currently on her off week before starting her next cycle (#4) this coming Monday.     --Denies any significant nausea, vomiting or weight loss, last weight was taken in house 112lbs 03/07/19.  Denies any hand-foot syndrome, or mucositis.  Applying topical lubricants as directed, and performing salt water rinses 2-3 times per day.  --HPI unchanged 03/07/19    --Reports history of baseline diarrhea, resolved with cutting back on caffeine and taking BID Imodium.  She denies any blood in her stool and is actively followed by her PCP Dr. Graciela Husbands.   Recent f/u with GI specialist 01/14/2019, Dr. Maren Beach.  She had negative stool cultures collected most recently on 12/28/2018 for both C. difficile and a GI culture stool panel.  Last colonoscopy performed 03/02/2018, pathology was benign.  --HPI unchanged 03/07/19    --Reports no worsening of her partial seizures.  She is followed closely by her neurologist Dr. Loma Newton, last F/U 02/10/19  Last seizure was in February 2020 lasted 8 seconds she was at home and well supported by her sister any cares per her and is her POA.  She denies any recent changes in her seizure history medication.    --She denies any chest pain, DOE or palpitations.  Occasionally she says she has some swelling in her lower extremities.  Today she has not noticed any swelling.  She continues to stay active at home performing her ADLs such as sweeping, washing clothes, and cooking when she is able.  She denies any recent falls in the past 6 months.  --HPI unchanged 03/07/19    --Performance status 0-1 due to nonbreast cancer issues such as diarrhea      ROS (bolded to indicate positive)   Symptom Notes   Appetite/Fatigue/Fever/Chills    Constipation/Diarrhea/Nausea/Vomiting  X--see above   Pain    Hot flashes    Anxiety/Depression/Sleep changes    Cough/SOB    Itching/Rash    Peripheral neuropathy/Swelling  X--see above   Headache    Urinary complaints    Sexual dysfunction/Vaginal dryness    Other  X--see above       HEMATOLOGICAL / ONCOLOGICAL HISTORY:        Malignant neoplasm of upper-outer quadrant of left breast in female, estrogen receptor negative (CMS-HCC)    10/2017 -  Presenting Symptoms     Patient self-palpated left breast mass prompting Dx imaging.      10/28/2017 Interval Scan(s)     B/L Dx MMG: Left breast ~3.1 x 2.8 cm mass w/calcs UOQ. Grouped coarse heterogeneous calcifications in the upper outer quadrant of the right breast measuring 0.6 cm.   [place]: Left breast 2:00 position, 6CFN mass 2.8 x 1.3 x 2.6 cm with internal calcifications. There is an adjacent ill-defined hypoechoic area next to the primary mass measuring approximately 2.3 x 0.7 cm which may represent an extension of the mass versus satellite lesion.    [place]: Left axilla abnormal lymph node or mass measuring 2.0 x 0.9 x 1.2 cm. Multiple other normal-appearing lymph nodes are demonstrated.    Targeted diagnostic ultrasound of the right breast in the upper-outer quadrant performed to further evaluate calcifications demonstrated on the mammogram due to the patient's difficulty with tolerating a possible stereotactic guided biopsy. The calcifications were not demonstrated sonographically. Normal breast parenchyma was demonstrated.      11/03/2017 Biopsy Rt breast SBx: fibroadenoma w/calcs  Lt breast CBx: IDC, G2, microcalcs a/w carcinoma  Lt axillary LN: + metastatic adenoCA    Receptors: ER- PR- Her2 Eq 2+ FISH NON-amplified.        11/20/2017 Interval Scan(s)     PET  --Intensely FDG avid left breast invasive ductal carcinoma with intensely avid left axillary biopsy proven nodal metastasis.  -- Moderately avid right breast mass is superior/anterior to the right breast clip, likely small hemorrhage and inflammation given recent stereotactic biopsy. Mild right axillary uptake is likely reactive.   -- No evidence of distant metastatic disease.  -- Enlarged, heterogeneous thyroid without abnormal FDG uptake. Findings compatible with multinodular goiter, as was described on thyroid ultrasound 05/01/2010.       Other     Carbo/Taxol  - C1/12 12/21/17. Carbo AUC 1, paclitaxel 80/m2.  Breast mass responding well to treatment.  - C3/12: 01/04/18 Carbo escalated to 1.5 AUC, paclitaxel 80 mg/m?? some decrease in hemoglobin.    - C4/12:  In view of ANC 1.2 decrease carbo back to AUC 1.0 this week and next  - C6/12: 01/27/18 HOLD Carbo due to Cr 1.75, only paclitaxel 80 mg/m2 given  - C7/12: 02/03/18 HOLD carbo and paclitaxel due to ANC 900. At this point, doubt that she will tolerate additional chemotherapy given her multiple medical co-morbidities      12/01/2017 - 01/31/2018 Chemotherapy     Chemotherapy Treatment    Treatment Goal Curative   Line of Treatment [No plan line of treatment]   Plan Name OP BREAST PACLItaxel WEEKLY / CARBOplatin EVERY 3 WEEKS   Start Date 12/21/2017   End Date 01/27/2018   Provider Fayne Norrie Muss, MD   Chemotherapy dexamethasone (DECADRON) tablet 20 mg, 20 mg, Oral, Once, 2 of 4 cycles  Administration: 20 mg (12/21/2017), 20 mg (12/28/2017), 20 mg (01/04/2018), 20 mg (01/11/2018), 20 mg (01/18/2018), 20 mg (01/27/2018)  CARBOplatin (PARAPLATIN) 117.6 mg in sodium chloride (NS) 0.9 % 100 mL IVPB, 117.6 mg (92.7 % of original dose 126.9 mg), Intravenous, Once, 2 of 4 cycles  Dose modification:   (original dose 126.9 mg, Cycle 1)  Administration: 74.8 mg (12/21/2017), 89.1 mg (12/28/2017), 123 mg (01/04/2018), 85.3 mg (01/11/2018), 76.8 mg (01/18/2018)  PACLItaxel (TAXOL) 126.42 mg in sodium chloride 0.9% NON-PVC (NS) 0.9 % 250 mL IVPB, 80 mg/m2 = 126.42 mg, Intravenous, Once, 2 of 4 cycles  Administration: 126.42 mg (12/21/2017), 126.42 mg (12/28/2017),  126.42 mg (01/04/2018), 126.42 mg (01/11/2018), 126.42 mg (01/18/2018), 126.42 mg (01/27/2018)         03/16/2018 Surgery     Left mastectomy+ALND: IDC, T=61mm, G3, Probable to Definite response to NACT. 1/18 (macromets=80mm) RCB: II-3.235      03/16/2018 -  Cancer Staged     Staging form: Breast, AJCC 8th Edition  - Pathologic stage from 03/16/2018: No Stage Recommended (ypT1c, pN1a, cM0, G3, ER-, PR-, HER2-) - Signed by Marcy Panning, FNP on 06/02/2018        06/02/2018 - 07/13/2018 Chemotherapy     OP BREAST AC (DOSE DENSE) x 4 cycles  DOXOrubicin 60 mg/m2, Cyclophosphamide 600 mg/m2 every 14 days        08/11/2018 - 09/27/2018 Radiation     approximate date completions. Dr. Carles Collet [facility]      12/13/2018 -  Chemotherapy     Capecitabine 1000 mg po BID x 14 days then 7 days off  Start date: 12/13/18  Pharmacy:  [facility]   Cycle 2 start date: 01/03/19        Invasive ductal carcinoma of breast, female, left (CMS-HCC)    12/21/2017 - 01/27/2018 Chemotherapy     Taxol Carbo with major toxicity         OTHER PAST MEDICAL HISTORY:     Patient Active Problem List   Diagnosis   ??? HTN   ??? Pure hypercholesterolemia (RAF-HCC)   ??? Vitamin D deficiency   ??? Tobacco use     ??? Goiter (RAF-HCC)   ??? Problem with medical care compliance   ??? Coronary Artery Disease s/p PCI to RCA   ??? Situational depression   ??? Heart failure (CMS-HCC)   ??? Focal epilepsy with impairment of consciousness, intractable (CMS-HCC)   ??? Malignant neoplasm of upper-outer quadrant of left breast in female, estrogen receptor negative (CMS-HCC)   ??? Invasive ductal carcinoma of breast, female, left (CMS-HCC)   ??? Falls frequently   ??? Diarrhea   ??? 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 Anemia   ??? Anemia in neoplastic disease       ALLERGIES:     Iodinated contrast media and Other    MEDICATIONS:       Outpatient Encounter Medications as of 03/07/2019   Medication Sig Dispense Refill   ??? acetaminophen (TYLENOL) 500 MG tablet Take 500 mg by mouth every four (4) hours as needed for pain.     ??? amLODIPine (NORVASC) 10 MG tablet Take 1 tablet (10 mg total) by mouth daily. (Patient not taking: Reported on 02/15/2019) 90 tablet 1   ??? aspirin 81 MG chewable tablet Chew 81 mg once daily.      ??? atorvastatin (LIPITOR) 40 MG tablet TAKE 1 TABLET EVERY DAY 90 tablet 3   ??? betamethasone dipropionate (DIPROLENE) 0.05 % ointment Apply to rash twice a day (Patient not taking: Reported on 02/15/2019) 90 g 3   ??? capecitabine (XELODA) 500 MG tablet Take 2 tablets (1,000mg ) by mouth twice daily for 14 days, then HOLD for 7 days. 56 tablet 1   ??? carvedilol (COREG) 25 MG tablet TAKE 1 TABLET TWICE DAILY 180 tablet 3   ??? clonazePAM (KLONOPIN) 1 MG tablet Take 1 tablet (1 mg total) by mouth two (2) times a day. 60 tablet 5   ??? fluconazole (DIFLUCAN) 200 MG tablet Take 2 tablets (400 mg total) by mouth once. for 1 dose 2 tablet 0   ???  fluconazole (DIFLUCAN) 200 MG tablet Take 2 tablets (400mg ) by mouth today and then Take 1 tablet (200 mg total) by mouth daily. for 13 days 15 tablet 0   ??? furosemide (LASIX) 20 MG tablet Take 1 tablet (20 mg total) by mouth daily as needed for swelling. 15 tablet 3   ??? gabapentin (NEURONTIN) 300 MG capsule TAKE 1 CAPSULE BY MOUTH 3 TIMES DAILY 270 capsule 3   ??? levETIRAcetam (KEPPRA) 500 MG tablet Take 2 tablets by mouth in the morning and 3 tablets at night. 450 tablet 4   ??? loperamide HCl (IMODIUM A-D ORAL) Take 6 tablets by mouth daily as needed.      ??? losartan (COZAAR) 100 MG tablet Take 1 tablet (100 mg total) by mouth daily. 90 tablet 3   ??? meTHIMazole (TAPAZOLE) 5 MG tablet Take 1 tablet (5 mg total) by mouth two (2) times a day. 180 tablet 1   ??? mometasone (ELOCON) 0.1 % cream Apply to left chest twice daily during radiation treatment 45 g 1   ??? potassium chloride (KLOR-CON) 10 MEQ CR tablet Take 1 tablet (10 mEq total) by mouth daily. Only take this if you are taking the furosemide (lasix) 30 tablet 11   ??? prochlorperazine (COMPAZINE) 10 MG tablet Take 10 mg by mouth every six (6) hours as needed for nausea.     ??? silver sulfaDIAZINE (SILVADENE, SSD) 1 % cream Apply to affected area daily (Patient not taking: Reported on 02/15/2019) 50 g 1   ??? [DISCONTINUED] phenytoin (DILANTIN) 100 MG ER capsule Take 3 capsules (300 mg total) by mouth nightly. 270 capsule 3     No facility-administered encounter medications on file as of 03/07/2019.      Physical Exam   Vitals:    03/07/19 1048   BP: 202/93   Pulse: 58   SpO2: 100%     Vitals:    03/07/19 1048   Weight: 50.9 kg (112 lb 4.8 oz)     General: Comfortable, NAD   Psychiatric:  Alert and oriented.  Mood is normal.  No other symptoms.   Upper Extremity Lymphedema: None  Chest: Clear to percussion auscultation  Cardiac: 3/6 systolic ejection murmur, no rubs or gallops  Breast and regional nodes: Left mastectomy site well-healed without any evidence of recurrence or skin breakdown at this time.  Right breast exam negative.  Abdomen: Soft, non-tender, no distention, no hepatosplenomegaly  Skin: No suspicious rashes or skin lesions, no ecchymosis or erthema.        Neuro: A&Ox3  Needs no help getting on exam table

## 2019-03-09 MED FILL — CAPECITABINE 500 MG TABLET: 21 days supply | Qty: 56 | Fill #1

## 2019-03-09 MED FILL — CAPECITABINE 500 MG TABLET: 21 days supply | Qty: 56 | Fill #1 | Status: AC

## 2019-03-22 MED FILL — CLONAZEPAM 1 MG TABLET: ORAL | 30 days supply | Qty: 60 | Fill #1

## 2019-03-22 MED FILL — CLONAZEPAM 1 MG TABLET: 30 days supply | Qty: 60 | Fill #1 | Status: AC

## 2019-03-28 ENCOUNTER — Ambulatory Visit: Admit: 2019-03-28 | Discharge: 2019-03-29 | Payer: MEDICARE

## 2019-03-28 ENCOUNTER — Ambulatory Visit: Admit: 2019-03-28 | Discharge: 2019-03-29 | Payer: MEDICARE | Attending: Family | Primary: Family

## 2019-03-28 DIAGNOSIS — C50412 Malignant neoplasm of upper-outer quadrant of left female breast: Principal | ICD-10-CM

## 2019-03-28 DIAGNOSIS — M79674 Pain in right toe(s): Secondary | ICD-10-CM

## 2019-03-28 DIAGNOSIS — Z171 Estrogen receptor negative status [ER-]: Secondary | ICD-10-CM

## 2019-03-28 DIAGNOSIS — I509 Heart failure, unspecified: Secondary | ICD-10-CM

## 2019-03-28 DIAGNOSIS — C50912 Malignant neoplasm of unspecified site of left female breast: Principal | ICD-10-CM

## 2019-03-28 LAB — COMPREHENSIVE METABOLIC PANEL
ALBUMIN: 4 g/dL (ref 3.5–5.0)
ALBUMIN: 4.2 g/dL (ref 3.5–5.0)
ALKALINE PHOSPHATASE: 80 U/L (ref 38–126)
ALKALINE PHOSPHATASE: 80 U/L (ref 38–126)
ALT (SGPT): 9 U/L (ref <35)
ANION GAP: 7 mmol/L (ref 7–15)
ANION GAP: 7 mmol/L (ref 7–15)
AST (SGOT): 27 U/L (ref 14–38)
AST (SGOT): 22 U/L (ref 14–38)
BILIRUBIN TOTAL: 0.7 mg/dL (ref 0.0–1.2)
BILIRUBIN TOTAL: 0.7 mg/dL (ref 0.0–1.2)
BLOOD UREA NITROGEN: 22 mg/dL — ABNORMAL HIGH (ref 7–21)
BLOOD UREA NITROGEN: 21 mg/dL (ref 7–21)
BUN / CREAT RATIO: 20
BUN / CREAT RATIO: 20
CALCIUM: 9.2 mg/dL (ref 8.5–10.2)
CALCIUM: 9.4 mg/dL (ref 8.5–10.2)
CHLORIDE: 110 mmol/L — ABNORMAL HIGH (ref 98–107)
CHLORIDE: 110 mmol/L — ABNORMAL HIGH (ref 98–107)
CO2: 26 mmol/L (ref 22.0–30.0)
CO2: 25 mmol/L (ref 22.0–30.0)
CREATININE: 1.08 mg/dL — ABNORMAL HIGH (ref 0.60–1.00)
CREATININE: 1.06 mg/dL — ABNORMAL HIGH (ref 0.60–1.00)
EGFR CKD-EPI AA FEMALE: 61 mL/min/{1.73_m2} (ref >=60)
EGFR CKD-EPI AA FEMALE: 63 mL/min/{1.73_m2} (ref >=60)
EGFR CKD-EPI NON-AA FEMALE: 53 mL/min/{1.73_m2} — ABNORMAL LOW (ref >=60)
EGFR CKD-EPI NON-AA FEMALE: 54 mL/min/{1.73_m2} — ABNORMAL LOW (ref >=60)
GLUCOSE RANDOM: 91 mg/dL (ref 70–179)
POTASSIUM: 3.7 mmol/L (ref 3.5–5.0)
POTASSIUM: 4 mmol/L (ref 3.5–5.0)
PROTEIN TOTAL: 6.9 g/dL (ref 6.5–8.3)
PROTEIN TOTAL: 7.2 g/dL (ref 6.5–8.3)
SODIUM: 143 mmol/L (ref 135–145)
SODIUM: 142 mmol/L (ref 135–145)

## 2019-03-28 LAB — CBC W/ AUTO DIFF
BASOPHILS ABSOLUTE COUNT: 0 10*9/L (ref 0.0–0.1)
BASOPHILS ABSOLUTE COUNT: 0 10*9/L (ref 0.0–0.1)
BASOPHILS RELATIVE PERCENT: 0.5 %
BASOPHILS RELATIVE PERCENT: 0.7 %
EOSINOPHILS ABSOLUTE COUNT: 0.3 10*9/L (ref 0.0–0.4)
EOSINOPHILS RELATIVE PERCENT: 8.4 %
EOSINOPHILS RELATIVE PERCENT: 8.6 %
HEMATOCRIT: 32.5 % — ABNORMAL LOW (ref 36.0–46.0)
HEMATOCRIT: 32.8 % — ABNORMAL LOW (ref 36.0–46.0)
HEMOGLOBIN: 10 g/dL — ABNORMAL LOW (ref 13.5–16.0)
HEMOGLOBIN: 10.1 g/dL — ABNORMAL LOW (ref 13.5–16.0)
LARGE UNSTAINED CELLS: 3 % (ref 0–4)
LARGE UNSTAINED CELLS: 3 % (ref 0–4)
LYMPHOCYTES ABSOLUTE COUNT: 0.7 10*9/L — ABNORMAL LOW (ref 1.5–5.0)
LYMPHOCYTES ABSOLUTE COUNT: 0.5 10*9/L — ABNORMAL LOW (ref 1.5–5.0)
LYMPHOCYTES RELATIVE PERCENT: 21.5 %
LYMPHOCYTES RELATIVE PERCENT: 16.6 %
MEAN CORPUSCULAR HEMOGLOBIN CONC: 30.8 g/dL — ABNORMAL LOW (ref 31.0–37.0)
MEAN CORPUSCULAR HEMOGLOBIN CONC: 30.8 g/dL — ABNORMAL LOW (ref 31.0–37.0)
MEAN CORPUSCULAR HEMOGLOBIN: 36.7 pg — ABNORMAL HIGH (ref 26.0–34.0)
MEAN CORPUSCULAR HEMOGLOBIN: 36.6 pg — ABNORMAL HIGH (ref 26.0–34.0)
MEAN CORPUSCULAR VOLUME: 118.9 fL — ABNORMAL HIGH (ref 80.0–100.0)
MEAN CORPUSCULAR VOLUME: 119 fL — ABNORMAL HIGH (ref 80.0–100.0)
MONOCYTES ABSOLUTE COUNT: 0.2 10*9/L (ref 0.2–0.8)
MONOCYTES ABSOLUTE COUNT: 0.2 10*9/L (ref 0.2–0.8)
MONOCYTES RELATIVE PERCENT: 7 %
MONOCYTES RELATIVE PERCENT: 7.1 %
NEUTROPHILS ABSOLUTE COUNT: 2 10*9/L (ref 2.0–7.5)
NEUTROPHILS ABSOLUTE COUNT: 2.1 10*9/L (ref 2.0–7.5)
NEUTROPHILS RELATIVE PERCENT: 59.9 %
NEUTROPHILS RELATIVE PERCENT: 64.2 %
PLATELET COUNT: 107 10*9/L — ABNORMAL LOW (ref 150–440)
RED BLOOD CELL COUNT: 2.73 10*12/L — ABNORMAL LOW (ref 4.00–5.20)
RED BLOOD CELL COUNT: 2.75 10*12/L — ABNORMAL LOW (ref 4.00–5.20)
RED CELL DISTRIBUTION WIDTH: 19.8 % — ABNORMAL HIGH (ref 12.0–15.0)
RED CELL DISTRIBUTION WIDTH: 20.1 % — ABNORMAL HIGH (ref 12.0–15.0)
WBC ADJUSTED: 3.3 10*9/L — ABNORMAL LOW (ref 4.5–11.0)
WBC ADJUSTED: 3.3 10*9/L — ABNORMAL LOW (ref 4.5–11.0)

## 2019-03-28 LAB — NEUTROPHILS ABSOLUTE COUNT: Lab: 2.1

## 2019-03-28 LAB — SMEAR REVIEW

## 2019-03-28 LAB — MEAN CORPUSCULAR VOLUME: Lab: 118.9 — ABNORMAL HIGH

## 2019-03-28 LAB — CREATININE: Creatinine:MCnc:Pt:Ser/Plas:Qn:: 1.06 — ABNORMAL HIGH

## 2019-03-28 LAB — ALT (SGPT): Alanine aminotransferase:CCnc:Pt:Ser/Plas:Qn:: 9

## 2019-03-28 MED ORDER — FUROSEMIDE 20 MG TABLET
ORAL_TABLET | Freq: Every day | ORAL | 3 refills | 15.00000 days | Status: CP | PRN
Start: 2019-03-28 — End: 2019-04-04
  Filled 2019-03-30: qty 15, 15d supply, fill #0

## 2019-03-28 NOTE — Unmapped (Signed)
Spoke to pt and she stated that both legs and feet are swollen and she cannot wiggle her toes. She denies SOB, chest pain or any other sx at this time.    She is taking the lasix as needed for the swelling and it is not helping.   Her next appt with you is on 04/08/19.   Should she schedule to be evaluated through walk-in?

## 2019-03-28 NOTE — Unmapped (Signed)
Ask her to avoid high sodium foods (like fast foods).  Elevate legs.  Double furosemide 20mg  so she will take an extra dose tonight and then take 40mg  tomorrow morning.  If swelling is not improved by tomorrow afternoon or if getting worse she should be seen at our acute care clinic.

## 2019-03-28 NOTE — Unmapped (Signed)
Spoke to pt and relayed the message.   She stated that she only has 3 pills left.   Can she get a refill?

## 2019-03-28 NOTE — Unmapped (Signed)
Sonya Williams from [facility] called regarding her legs swelling. Per Sonya Williams her ankles are swelling and she can not wiggle her toes. Patient is taking her Lasix everyday but not checking her weight. LVM for patient to call back regarding this.

## 2019-03-28 NOTE — Unmapped (Signed)
BREAST MEDICAL ONCOLOGY RETURN PROGRESS NOTE    Patient Name: Sonya Williams  Patient Age: 68 y.o.female  Encounter Date: 03/28/2019    Primary Care Provider:  Garnette Czech, MD    Referring Physician:  Nicki Guadalajara*    Breast Med/Onc:  Servando Salina, MD    PATIENT LOCATION DURING CALL:  HOME   OTHERS PRESENT DURING CALL: None  REASON FOR VISIT: Management of breast cancer.  CURRENT TREATMENT: Adjuvant Xeloda    ASSESSMENT   1. Malignant neoplasm of upper-outer quadrant of left breast in female, estrogen receptor negative (CMS-HCC)    ??This is a 68 y.o. year old female with a clinical stage IIIB (cT2cN1xM0),??infiltrating ductal carcinoma, grade 2, estrogen receptor negative, progesterone receptor negative and HER2 negativeebruary 2??of the left breast, diagnosed 11/03/2017.  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      She completed neoadjuvant weekly carboplatin with an AUC of 1.5, along with standard dosing paclitaxel at 80 mg/m2 for a planned total of 12 weeks of treatment. Only completed half the treatment prior to proceeding with surgical resection.  Treatment stopped because of hospitalization for diarrhea falls and seizures. She has significant residual cancer based on pathology from her July 2019 mastectomy with 1/8 nodes positive for disease.  Based on her RCB score she had an 80% chance of recurrence in 5 years without any further therapy, therefore she resumed therapy completing 4 cycles of dose dense AC without any major toxicity.  She is now status post radiation and started adjuvant Xeloda thousand milligrams twice daily and tolerating well, so will proceed to the next cycle of Xeloda. We will continue to follow her closely given her comorbid conditions, but at this time she is tolerating Xeloda quite well without significant hand-foot syndrome, mucositis or diarrhea.    Cancer Staging Malignant neoplasm of upper-outer quadrant of left breast in female, estrogen receptor negative (CMS-HCC)  Staging form: Breast, AJCC 8th Edition  - Clinical stage from 11/11/2017: Stage IIIB (cT2, cN1(f), cM0, G2, ER: Negative, PR: Negative, HER2: Equivocal) - Signed by Rudie Meyer, ANP on 11/11/2017  - Pathologic stage from 03/16/2018: No Stage Recommended (ypT1c, pN1a, cM0, G3, ER-, PR-, HER2-) - Signed by Marcy Panning, FNP on 06/02/2018      PLAN     -Treatment goals: Curative    -Systemic therapy: Started first cycle of Xeloda 12/13/2018; tolerating well with stable labs.  We will continue with treatment based on the rationale below as noted by Dr. Garlon Hatchet:  The patient does not meet the ideal criteria of the create X trial as she had only partial neoadjuvant therapy which was stopped for toxicity, then residual cancer and then 4 cycles of dose dense AC.  I am ambivalent this where the recommend Xeloda here in view of her persistent diarrhea and the major toxicities she had with Taxol and carboplatinum.  I am going to have our pharmacist call her and discuss the toxicities of treatment.  We might consider starting at a low dose but her diarrhea has been a major problem and its etiology remains unclear.  I discussed all these issues with the patient.   - Genetics: Seen by Herbert Pun and no genetic testing recommended  - Smoking: has tried numerous methods and continues to smoke probably a little less than a pack a day.  Was referred to smoking cessation clinic  previously. No plans to quit yet.   -Complex partial seizures: Follows with neurology, last partial seizure 10/2018.  Followed-up with Dr. Loma Newton -02/10/2019, continues on Keppra, Clonazepam, Gabapentin  -Diarrhea: Diarrhea now resolved with cutting back on caffeine and BID Imodium.  Last colonoscopy was in 03/2019 with benign pathology.  -Right toe pain: X-rays from today confirm no fracture or dislocation, only osteoarthritis.  Patient will continue with Tylenol, ice and suggested a referral to podiatry with persistent symptoms    DISPO:   --04/06/19 provider visit with [facility] in [place]  --04/25/2019 labs in [place] only  --04/26/2019 provider visit with Muss-phone at home    I spent at least 40 minutes with this patient more than 50% in counseling and care co-ordination.      [name], NP, MSN  Breast Oncology Division  Pager [phone]  [email or web address]    INTERVAL HISTORY     Interval History:   [name] 68 y.o. female being called today for management of breast cancer.      --Patient presents today for her 4 week follow-up currently on Xeloda.  States she is feeling well overall.    --Started her first cycle of Xeloda on 12/13/2018.  Taking 2 tablets twice daily as directed, 2 weeks on 1 week off.  Currently on 5th cycle of Xeloda--on her off week.    --Denies any significant nausea, vomiting or weight loss.  Denies any hand-foot syndrome, or mucositis.  Applying topical lubricants as directed, and performing salt water rinses 2-3 times per day.  --HPI unchanged 03/28/19    --Reports history of baseline diarrhea, resolved with cutting back on caffeine and taking BID Imodium.  She denies any blood in her stool and is actively followed by her PCP Dr. Graciela Husbands.   Recent f/u with GI specialist 01/14/2019, Dr. Maren Beach.  She had negative stool cultures collected most recently on 12/28/2018 for both C. difficile and a GI culture stool panel.  Last colonoscopy performed 03/02/2018, pathology was benign.  --HPI unchanged 03/28/19    --Reports no worsening of her partial seizures.  She is followed closely by her neurologist Dr. Loma Newton, last F/U 02/10/19  Last seizure was in February 2020 lasted 8 seconds she was at home and well supported by her sister any cares per her and is her POA.  She denies any recent changes in her seizure history medication.    --Patient reports right third toe discomfort x2 days.  States she was walking out of her kitchen and kicked a clothing trunk and her path accidentally.  No is tender to the touch.  No redness or swelling she reports.  States she has fractured her toe previously and suspects a toe fracture.    --She denies any chest pain, DOE or palpitations.  Occasionally she says she has some swelling in her lower extremities.  Today she has not noticed any swelling.  She continues to stay active at home performing her ADLs such as sweeping, washing clothes, and cooking when she is able.  She denies any recent falls in the past 6 months.  --HPI unchanged 03/28/19    --Performance status 0-1 due to nonbreast cancer issues such as diarrhea      ROS (bolded to indicate positive)   Symptom Notes   Appetite/Fatigue/Fever/Chills    Constipation/Diarrhea/Nausea/Vomiting  X--see above   Pain  X--see above   Hot flashes    Anxiety/Depression/Sleep changes    Cough/SOB    Itching/Rash    Peripheral neuropathy/Swelling  X--see above  Headache    Urinary complaints    Sexual dysfunction/Vaginal dryness    Other  X--see above       HEMATOLOGICAL / ONCOLOGICAL HISTORY:     Oncology History   Malignant neoplasm of upper-outer quadrant of left breast in female, estrogen receptor negative (CMS-HCC)   10/2017 -  Presenting Symptoms    Patient self-palpated left breast mass prompting Dx imaging.     10/28/2017 Interval Scan(s)    B/L Dx MMG: Left breast ~3.1 x 2.8 cm mass w/calcs UOQ. Grouped coarse heterogeneous calcifications in the upper outer quadrant of the right breast measuring 0.6 cm.   [place]: Left breast 2:00 position, 6CFN mass 2.8 x 1.3 x 2.6 cm with internal calcifications. There is an adjacent ill-defined hypoechoic area next to the primary mass measuring approximately 2.3 x 0.7 cm which may represent an extension of the mass versus satellite lesion.    [place]: Left axilla abnormal lymph node or mass measuring 2.0 x 0.9 x 1.2 cm. Multiple other normal-appearing lymph nodes are demonstrated.    Targeted diagnostic ultrasound of the right breast in the upper-outer quadrant performed to further evaluate calcifications demonstrated on the mammogram due to the patient's difficulty with tolerating a possible stereotactic guided biopsy. The calcifications were not demonstrated sonographically. Normal breast parenchyma was demonstrated.     11/03/2017 Biopsy    Rt breast SBx: fibroadenoma w/calcs  Lt breast CBx: IDC, G2, microcalcs a/w carcinoma  Lt axillary LN: + metastatic adenoCA    Receptors: ER- PR- Her2 Eq 2+ FISH NON-amplified.       11/20/2017 Interval Scan(s)    PET  --Intensely FDG avid left breast invasive ductal carcinoma with intensely avid left axillary biopsy proven nodal metastasis.  -- Moderately avid right breast mass is superior/anterior to the right breast clip, likely small hemorrhage and inflammation given recent stereotactic biopsy. Mild right axillary uptake is likely reactive.   -- No evidence of distant metastatic disease.  -- Enlarged, heterogeneous thyroid without abnormal FDG uptake. Findings compatible with multinodular goiter, as was described on thyroid ultrasound 05/01/2010.      Other    Carbo/Taxol  - C1/12 12/21/17. Carbo AUC 1, paclitaxel 80/m2.  Breast mass responding well to treatment.  - C3/12: 01/04/18 Carbo escalated to 1.5 AUC, paclitaxel 80 mg/m?? some decrease in hemoglobin.    - C4/12:  In view of ANC 1.2 decrease carbo back to AUC 1.0 this week and next  - C6/12: 01/27/18 HOLD Carbo due to Cr 1.75, only paclitaxel 80 mg/m2 given  - C7/12: 02/03/18 HOLD carbo and paclitaxel due to ANC 900. At this point, doubt that she will tolerate additional chemotherapy given her multiple medical co-morbidities     12/01/2017 - 01/31/2018 Chemotherapy    Chemotherapy Treatment    Treatment Goal Curative   Line of Treatment [No plan line of treatment]   Plan Name OP BREAST PACLItaxel WEEKLY / CARBOplatin EVERY 3 WEEKS   Start Date 12/21/2017   End Date 01/27/2018   Provider Fayne Norrie Muss, MD   Chemotherapy dexamethasone (DECADRON) tablet 20 mg, 20 mg, Oral, Once, 2 of 4 cycles  Administration: 20 mg (12/21/2017), 20 mg (12/28/2017), 20 mg (01/04/2018), 20 mg (01/11/2018), 20 mg (01/18/2018), 20 mg (01/27/2018)  CARBOplatin (PARAPLATIN) 117.6 mg in sodium chloride (NS) 0.9 % 100 mL IVPB, 117.6 mg (92.7 % of original dose 126.9 mg), Intravenous, Once, 2 of 4 cycles  Dose modification:   (original dose 126.9 mg, Cycle 1)  Administration: 74.8  mg (12/21/2017), 89.1 mg (12/28/2017), 123 mg (01/04/2018), 85.3 mg (01/11/2018), 76.8 mg (01/18/2018)  PACLItaxel (TAXOL) 126.42 mg in sodium chloride 0.9% NON-PVC (NS) 0.9 % 250 mL IVPB, 80 mg/m2 = 126.42 mg, Intravenous, Once, 2 of 4 cycles  Administration: 126.42 mg (12/21/2017), 126.42 mg (12/28/2017), 126.42 mg (01/04/2018), 126.42 mg (01/11/2018), 126.42 mg (01/18/2018), 126.42 mg (01/27/2018)        03/16/2018 Surgery    Left mastectomy+ALND: IDC, T=54mm, G3, Probable to Definite response to NACT. 1/18 (macromets=72mm) RCB: II-3.235     03/16/2018 -  Cancer Staged    Staging form: Breast, AJCC 8th Edition  - Pathologic stage from 03/16/2018: No Stage Recommended (ypT1c, pN1a, cM0, G3, ER-, PR-, HER2-) - Signed by Marcy Panning, FNP on 06/02/2018       06/02/2018 - 07/26/2018 Chemotherapy    OP BREAST AC (DOSE DENSE) x 4 cycles  DOXOrubicin 60 mg/m2, Cyclophosphamide 600 mg/m2 every 14 days       08/11/2018 - 09/27/2018 Radiation    approximate date completions. Dr. Carles Collet [facility]     12/13/2018 -  Chemotherapy    Capecitabine 1000 mg po BID x 14 days then 7 days off  Start date: 12/13/18  Pharmacy:  [facility]   Cycle 2 start date: 01/03/19     Invasive ductal carcinoma of breast, female, left (CMS-HCC)   12/21/2017 - 01/27/2018 Chemotherapy    Taxol Carbo with major toxicity         OTHER PAST MEDICAL HISTORY:     Patient Active Problem List   Diagnosis   ??? HTN   ??? Pure hypercholesterolemia (RAF-HCC)   ??? Vitamin D deficiency   ??? Tobacco use     ??? Goiter (RAF-HCC)   ??? Problem with medical care compliance   ??? Coronary Artery Disease s/p PCI to RCA   ??? Situational depression   ??? Heart failure (CMS-HCC)   ??? Focal epilepsy with impairment of consciousness, intractable (CMS-HCC)   ??? Malignant neoplasm of upper-outer quadrant of left breast in female, estrogen receptor negative (CMS-HCC)   ??? Invasive ductal carcinoma of breast, female, left (CMS-HCC)   ??? Falls frequently   ??? Diarrhea   ??? 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 Anemia   ??? Anemia in neoplastic disease       ALLERGIES:     Iodinated contrast media and Other    MEDICATIONS:       Outpatient Encounter Medications as of 03/28/2019   Medication Sig Dispense Refill   ??? acetaminophen (TYLENOL) 500 MG tablet Take 500 mg by mouth every four (4) hours as needed for pain.     ??? amLODIPine (NORVASC) 10 MG tablet Take 1 tablet (10 mg total) by mouth daily. 90 tablet 1   ??? aspirin 81 MG chewable tablet Chew 81 mg once daily.      ??? atorvastatin (LIPITOR) 40 MG tablet TAKE 1 TABLET EVERY DAY 90 tablet 3   ??? capecitabine (XELODA) 500 MG tablet Take 2 tablets (1,000mg ) by mouth twice daily for 14 days, then HOLD for 7 days. 56 tablet 1   ??? carvedilol (COREG) 25 MG tablet TAKE 1 TABLET TWICE DAILY 180 tablet 3   ??? clonazePAM (KLONOPIN) 1 MG tablet Take 1 tablet (1 mg total) by mouth two (2) times a day. 60 tablet 5   ??? gabapentin (NEURONTIN) 300 MG capsule TAKE 1 CAPSULE BY MOUTH 3 TIMES DAILY 270 capsule 3   ???  levETIRAcetam (KEPPRA) 500 MG tablet Take 2 tablets by mouth in the morning and 3 tablets at night. (Patient taking differently: Take 1.5 tablets by mouth in the morning and 1.5 tablets at night.) 450 tablet 4   ??? losartan (COZAAR) 100 MG tablet Take 1 tablet (100 mg total) by mouth daily. 90 tablet 3   ??? mometasone (ELOCON) 0.1 % cream Apply to left chest twice daily during radiation treatment 45 g 1   ??? betamethasone dipropionate (DIPROLENE) 0.05 % ointment Apply to rash twice a day (Patient not taking: Reported on 03/07/2019) 90 g 3   ??? fluconazole (DIFLUCAN) 200 MG tablet Take 2 tablets (400 mg total) by mouth once. for 1 dose 2 tablet 0   ??? fluconazole (DIFLUCAN) 200 MG tablet Take 2 tablets (400mg ) by mouth today and then Take 1 tablet (200 mg total) by mouth daily. for 13 days 15 tablet 0   ??? furosemide (LASIX) 20 MG tablet Take 1 tablet (20 mg total) by mouth daily as needed for swelling. 15 tablet 3   ??? loperamide HCl (IMODIUM A-D ORAL) Take 6 tablets by mouth daily as needed.      ??? meTHIMazole (TAPAZOLE) 5 MG tablet Take 1 tablet (5 mg total) by mouth two (2) times a day. (Patient not taking: Reported on 03/28/2019) 180 tablet 1   ??? potassium chloride (KLOR-CON) 10 MEQ CR tablet Take 1 tablet (10 mEq total) by mouth daily. Only take this if you are taking the furosemide (lasix) (Patient not taking: Reported on 03/28/2019) 30 tablet 11   ??? prochlorperazine (COMPAZINE) 10 MG tablet Take 10 mg by mouth every six (6) hours as needed for nausea.     ??? silver sulfaDIAZINE (SILVADENE, SSD) 1 % cream Apply to affected area daily (Patient not taking: Reported on 03/28/2019) 50 g 1   ??? [DISCONTINUED] phenytoin (DILANTIN) 100 MG ER capsule Take 3 capsules (300 mg total) by mouth nightly. 270 capsule 3     Facility-Administered Encounter Medications as of 03/28/2019   Medication Dose Route Frequency Provider Last Rate Last Dose   ??? heparin, porcine (PF) 100 unit/mL injection 500 Units  500 Units Intravenous Q30 Min PRN Fayne Norrie Muss, MD       ??? sodium chloride (NS) 0.9 % flush 20 mL  20 mL Intravenous Q30 Min PRN Isidoro Donning, MD         Physical Exam   Vitals:    03/28/19 0821   BP: 187/81   Pulse: 56   Resp: 18   Temp: 35.6 ??C (96.1 ??F)   SpO2: 97%     There were no vitals filed for this visit.  General: Comfortable, NAD   Neuro: A&Ox3  Psychiatric:  Alert and oriented.  Mood is normal.  No other symptoms.   Upper Extremity Lymphedema: None  Chest: Clear to percussion auscultation  Cardiac: 3/6 systolic ejection murmur, no rubs or gallops  MSK: TTP proximal third DIP joint without swelling  Breast and regional nodes: Left mastectomy site well-healed without any evidence of recurrence or skin breakdown at this time.  Right breast exam negative.  Abdomen: Soft, non-tender, no distention, no hepatosplenomegaly  Skin: No suspicious rashes or skin lesions, no ecchymosis or erthema.

## 2019-03-28 NOTE — Unmapped (Signed)
It was a pleasure seeing you today.   Continue with the current plan.  Follow-up in 4 weeks.     Labs from today if done:  Appointment on 03/07/2019   Component Date Value Ref Range Status   ??? Sodium 03/07/2019 142  135 - 145 mmol/L Final   ??? Potassium 03/07/2019 3.7  3.5 - 5.0 mmol/L Final   ??? Chloride 03/07/2019 110* 98 - 107 mmol/L Final   ??? Anion Gap 03/07/2019 8  7 - 15 mmol/L Final   ??? CO2 03/07/2019 24.0  22.0 - 30.0 mmol/L Final   ??? BUN 03/07/2019 26* 7 - 21 mg/dL Final   ??? Creatinine 03/07/2019 1.09* 0.60 - 1.00 mg/dL Final   ??? BUN/Creatinine Ratio 03/07/2019 24   Final   ??? EGFR CKD-EPI Non-African American,* 03/07/2019 53* >=60 mL/min/1.35m2 Final   ??? EGFR CKD-EPI African American, Fem* 03/07/2019 61  >=60 mL/min/1.91m2 Final   ??? Glucose 03/07/2019 91  70 - 179 mg/dL Final   ??? Calcium 60/45/4098 9.1  8.5 - 10.2 mg/dL Final   ??? Albumin 11/91/4782 4.0  3.5 - 5.0 g/dL Final   ??? Total Protein 03/07/2019 7.0  6.5 - 8.3 g/dL Final   ??? Total Bilirubin 03/07/2019 0.8  0.0 - 1.2 mg/dL Final   ??? AST 95/62/1308 20  14 - 38 U/L Final   ??? ALT 03/07/2019 9  <35 U/L Final   ??? Alkaline Phosphatase 03/07/2019 78  38 - 126 U/L Final   ??? WBC 03/07/2019 3.1* 4.5 - 11.0 10*9/L Final   ??? RBC 03/07/2019 2.80* 4.00 - 5.20 10*12/L Final   ??? HGB 03/07/2019 10.2* 13.5 - 16.0 g/dL Final   ??? HCT 65/78/4696 32.6* 36.0 - 46.0 % Final   ??? MCV 03/07/2019 116.4* 80.0 - 100.0 fL Final   ??? MCH 03/07/2019 36.6* 26.0 - 34.0 pg Final   ??? MCHC 03/07/2019 31.5  31.0 - 37.0 g/dL Final   ??? RDW 29/52/8413 20.9* 12.0 - 15.0 % Final   ??? MPV 03/07/2019 8.3  7.0 - 10.0 fL Final   ??? Platelet 03/07/2019 110* 150 - 440 10*9/L Final   ??? Neutrophils % 03/07/2019 59.7  % Final   ??? Lymphocytes % 03/07/2019 19.4  % Final   ??? Monocytes % 03/07/2019 7.9  % Final   ??? Eosinophils % 03/07/2019 9.5  % Final   ??? Basophils % 03/07/2019 0.4  % Final   ??? Absolute Neutrophils 03/07/2019 1.9* 2.0 - 7.5 10*9/L Final   ??? Absolute Lymphocytes 03/07/2019 0.6* 1.5 - 5.0 10*9/L Final   ??? Absolute Monocytes 03/07/2019 0.3  0.2 - 0.8 10*9/L Final   ??? Absolute Eosinophils 03/07/2019 0.3  0.0 - 0.4 10*9/L Final   ??? Absolute Basophils 03/07/2019 0.0  0.0 - 0.1 10*9/L Final   ??? Large Unstained Cells 03/07/2019 3  0 - 4 % Final   ??? Macrocytosis 03/07/2019 Marked* Not Present Final   ??? Anisocytosis 03/07/2019 Moderate* Not Present Final   ??? Hypochromasia 03/07/2019 Moderate* Not Present Final   ??? Smear Review Comments 03/07/2019 See Comment* Undefined Final    Smear reviewed.   ??? Schistocytes 03/07/2019 Rare* Not Present Final   ??? Acanthocytes 03/07/2019 Present* Not Present Final   ??? Howell-Jolly Bodies 03/07/2019 Present* Not Present Final   ??? Toxic Granulation 03/07/2019 Present* Not Present Final   ??? Poikilocytosis 03/07/2019 Moderate* Not Present Final        Future Appointments:  Future Appointments   Date Time  Provider Department Center   03/28/2019  8:00 AM Marcy Panning, FNP [facility]   04/06/2019 11:15 AM [facility] RM 1 [facility] [place]   04/06/2019  1:30 PM Alan Ripper, MD [facility] [address] [facility]   04/06/2019  3:00 PM Aris Everts, MD [facility]   04/08/2019  2:40 PM Felicity Coyer, MD [facility]       Please call our Nurse Navigator: Lucendia Herrlich, [phone] if you have any interval questions or concerns:    For appointments & questions Monday through Friday 8 AM??? 5 PM   please call [phone] or Toll free [phone].    On Nights, Weekends and Holidays  Call [phone] and ask for the oncologist on call.    [facility]  [address]  [place], [place] [address]  [email or web address]    FOR CAREGIVERS: Follow this link for excellent information on care giving. LookLarge.fr    EXERCISE AND NUTRITION: It's important that you exercise to keep your muscles strong.  Walking is an excellent exercise and if you're not doing so we recommend that you walk 30 minutes or more a day - 5 times a week.  This is an excellent way to keep fit.    We recommend that you eat a healthy American diet with adequate servings of fruits and vegetables.  It's important to be aware of how many calories you were taking in each day so that you can maintain a reasonable body weight. If you are having trouble with your diet or with weight control please let us know and we can refer you to our nutritionist who can give you advice and help. This website may also be helpful DiscoHelp.si.    HERBS AND SUPPLEMENTS:  If you are taking herbs and supplements please use the following website to determine their safety: PhotoConference.no    BONE HEALTH: We recommend taking Vitamin D 1000 units per day and 1200 mg of calcium per day. You can get more information on calcium intake by Googling: Calcium NIH, or going to the following site: https://ods.CakeDeveloper.com.cy. Exercising is also most important. Some patients may need medication for bone health and your doctor may discuss this with you.    GENERAL SURVEILLANCE PLAN:These are the national guidelines from ASCO and NCCN. The recommendations for follow-up care for breast cancer include regular physical examinations, mammograms, and breast self-examinations. The follow-up care may be provided by your oncologist or primary care doctor, as long as your primary care doctor has communicated with your oncologist about appropriate follow-up care. In addition, patients with a possible or known family history of breast cancer should be referred to a Dentist.    ? Visit your doctor every three to six months for the first three years after the first treatment, every six to 12 months for years four and five, and every year thereafter.    ? Schedule a mammogram one year after your first mammogram that led to diagnosis, but no earlier than six months after radiation therapy. Obtain a mammogram every six to 12 months thereafter.    ? Perform a breast self-examination every month. This procedure is not a substitute for a mammogram.    ? Continue to visit a gynecologist regularly. Women taking tamoxifen should report any vaginal bleeding to their doctor.    In addition to the plan outlined above, please call us if you experience:   ? New lumps in the breast   ? Bone pain   ?  Chest pain   ? Abdominal pain   ? Shortness of breath or difficulty breathing   ? Persistent headaches   ? Persistent coughing   ? Rash on breast   ? Nipple discharge (liquid coming from the nipple)     For emergencies, evenings or weekends, please call [phone] and ask for oncology fellow on call. Reasons to call emergency for patients receiving chemotherapy may include:    Fever of 100.5 or greater  Nausea and/or vomiting not relieved with nausea medicine  Diarrhea or constipation not relieved with bowel regimen  Severe pain not relieved with usual pain regimen

## 2019-03-29 NOTE — Unmapped (Signed)
Noted  

## 2019-03-29 NOTE — Unmapped (Signed)
Patient called stating that she took both doses of extra lasix (last night and this morning) and her legs are still swollen. Denies shortness of breath, chest pain, or dizziness.  She cannot come to the [facility] today. Appointment made for tomorrow morning.

## 2019-03-29 NOTE — Unmapped (Signed)
Addended by: Carleene Cooper on: 03/28/2019 05:01 PM     Modules accepted: Orders

## 2019-03-29 NOTE — Unmapped (Signed)
New rx sent to [facility]

## 2019-03-30 ENCOUNTER — Ambulatory Visit: Admit: 2019-03-30 | Discharge: 2019-03-31 | Payer: MEDICARE

## 2019-03-30 DIAGNOSIS — I890 Lymphedema, not elsewhere classified: Secondary | ICD-10-CM

## 2019-03-30 DIAGNOSIS — R609 Edema, unspecified: Principal | ICD-10-CM

## 2019-03-30 DIAGNOSIS — I509 Heart failure, unspecified: Secondary | ICD-10-CM

## 2019-03-30 LAB — PRO-BNP: Natriuretic peptide.B prohormone N-Terminal:MCnc:Pt:Ser/Plas:Qn:: 6900 — ABNORMAL HIGH

## 2019-03-30 MED ORDER — HYDROCHLOROTHIAZIDE 25 MG TABLET
ORAL_TABLET | Freq: Every day | ORAL | 0 refills | 30 days | Status: CP
Start: 2019-03-30 — End: 2019-03-30

## 2019-03-30 MED FILL — FUROSEMIDE 20 MG TABLET: 15 days supply | Qty: 15 | Fill #0 | Status: AC

## 2019-03-30 NOTE — Unmapped (Addendum)
Assessment:     Problem List Items Addressed This Visit     None      Visit Diagnoses     Dependent edema    -  Primary    Congestive heart failure, unspecified HF chronicity, unspecified heart failure type (CMS-HCC)        Relevant Orders    Pro-BNP    Lymphedema of arm        Relevant Orders    Physical Therapy           Plan:     Discussed case with her primary care provider Dr. Manya Silvas  No significant dependent edema at this time (9 AM)   Has had a 10 pound weight increase over the past 5 months  Pro-BNP pending.  Please contact patient with abnormal results.  Have instructed her to double up on her Lasix to 40 mg a day if her weight goes above 119 or if she has severe swelling in her legs  Referral made to PT [facility] therapy services for lymphedema evaluation and exercises  Discussed salt and caffeine reduction.  Discussed smoking cessation.  Recontact clinic if no improvement in 7 to 10 days    Addendum: Pro-BNP returned today with a value of 6900.  10 months ago it was 1680.  Discussed the case further with her primary care provider Dr. Graciela Husbands.  Have contacted patient by phone with the recommendation that she take 80 mg of Lasix each morning as well as 2 of her 10 mEq potassium tablets.  She is to do this for the next 5 days.  She is also to accurately document her weight each morning after urinating and before putting her clothes on.  She now has a phone follow-up appointment with Dr. Graciela Husbands on Monday, August 3 at 9 AM.  They will discuss her weights and future plan.    Subjective:     CC:    Chief Complaint   Patient presents with   ??? Edema     in legs, feet and left arm, reoccuring problem, per pt       HPI:  Sonya Williams is a 68 y.o. female who presents for evaluation of bilateral lower leg swelling not responding to her Lasix.  She is also concerned about left upper arm swelling.  Patient is 1 year status post left modified radical mastectomy secondary to breast cancer stage III B followed by [facility] oncology.  Patient with complicated medical history including CAD, hypertension, seizure disorder, left ventricular heart failure, and COPD.  It is difficult to get a clear history but she reports that the swelling in her left arm is rather recent.  Mastectomy is approximately 1 year ago.  Patient is concerned about the swelling in her legs and is quite adamant that she feels that the Lasix is not working.  She prefers to be put on hydrochlorothiazide but this was discontinued secondary to drug interaction issues.  At that time she was on Dilantin which has been discontinued.  Denies fever, cough, shortness of breath, hemoptysis.  Continues to smoke self-reported 3 cigarettes a day.  At one time she was smoking 3 packs a day.      Review of Systems:  A ten system ROS was conducted.    She reports the following pertinent positive ROS: as above      She reports the following pertinent negative ROS: as above    Allergies:  Iodinated contrast media and Other  Patient Care Team:  Felicity Coyer, MD as PCP - General (Family Medicine)  Felicity Coyer, MD as PCP - Rande Brunt  Megan Salon Carron Curie as Nurse Practitioner (Neurology)  Isidoro Donning, MD as Attending Provider (Oncology)  Social History     Socioeconomic History   ??? Marital status: Divorced     Spouse name: None   ??? Number of children: None   ??? Years of education: None   ??? Highest education level: None   Occupational History   ??? Occupation: Retired   Chief Executive Officer Needs   ??? Financial resource strain: None   ??? Food insecurity     Worry: None     Inability: None   ??? Transportation needs     Medical: None     Non-medical: None   Tobacco Use   ??? Smoking status: Current Every Day Smoker     Last attempt to quit: 11/08/2014     Years since quitting: 4.3   ??? Smokeless tobacco: Never Used   ??? Tobacco comment: 3 ciggarettes a day   Substance and Sexual Activity   ??? Alcohol use: No     Alcohol/week: 0.0 standard drinks   ??? Drug use: No   ??? Sexual activity: Not Currently   Lifestyle   ??? Physical activity     Days per week: None     Minutes per session: None   ??? Stress: None   Relationships   ??? Social Wellsite geologist on phone: None     Gets together: None     Attends religious service: None     Active member of club or organization: None     Attends meetings of clubs or organizations: None     Relationship status: None   Other Topics Concern   ??? Do you use sunscreen? Yes   ??? Tanning bed use? No   ??? Are you easily burned? No   ??? Excessive sun exposure? No   ??? Blistering sunburns? No   Social History Narrative    Lives with: no one    # of servings of fruits and/or vegetables/day: 4    Sugar sweetened drinks intake:  3    Caffeine intake: 3    Intensity of physical activity:  None _____  Mild ___x__ Moderate _____ Vigorous _____    Exercise frequency:  Never ___ Occ. _x__ 2-3 x/wk ___ 4-5 x/wk ___ 6-7 x/wk ___    Smoke alarm in home?  Yes _x__ No ___     CO alarm?  Yes x___ No ___    Guns in home unloaded and locked: Yes ___ No ___ Not applicable _x__    Seatbelts in car: Yes __x_ No___     Cellphone while driving: Yes__x_  No__x_    Helmet when riding a bike: Yes _x__ No ___         Use sunscreen: Yes __x_ No ___ome in every other day to help her.    Hit, kicked, punched, or otherwise hurt by someone in the past 5 years:  Yes___ No__x_    Safe in your current relationship: Yes___ No___        03/23/18        PCMH Components:        Family, social, cultural characteristics: social support includes alone, her sister    Patient has the following communication needs: vision issue using glasses.    Health Literacy: How confident are you that you understand your  health issues/concerns, can participate in your care, and manage your care along with your physician: confident.    Behaviors Affecting Health: secondhand Smoke Exposure.patient smokes cigarettes.    Family history of mental health illness and/or substance abuse: asked patient/parent and none disclosed.    Have you been seen by any medical provider that we have not referred you to since your last visit ? Yes [facility]    Discussed a Living Will with the patient and ZOX:WRUE decline information about Living Will today.     Family History   Problem Relation Age of Onset   ??? Cancer Mother         Stomach-mets to bone   ??? Stroke Father    ??? Hypertension Father    ??? Lung cancer Sister    ??? Alcohol Use Disorder Other    ??? Hypertension Other    ??? Cancer Maternal Grandmother    ??? Cancer Maternal Grandfather    ??? Cancer Paternal Grandmother    ??? Colon cancer Sister    ??? No Known Problems Daughter    ??? No Known Problems Paternal Grandfather    ??? Melanoma Neg Hx    ??? Basal cell carcinoma Neg Hx    ??? Squamous cell carcinoma Neg Hx    ??? Breast cancer Neg Hx    ??? BRCA 1/2 Neg Hx    ??? Endometrial cancer Neg Hx    ??? Ovarian cancer Neg Hx      Past Medical History:   Diagnosis Date   ??? Breast cancer (CMS-HCC)     left br ca   ??? CAD S/P percutaneous coronary angioplasty 09/2012   ??? Eczema    ??? Endometriosis    ??? Goiter NOS    ??? Heart attack (CMS-HCC) 09/2012   ??? HTN    ??? Hyperlipidemia    ??? NSTEMI (non-ST elevated myocardial infarction) (CMS-HCC) 08/2015   ??? Osteoporosis    ??? SEIZURE DISORDER    ??? Tobacco use    ??? Vitamin D deficiency NOS      OB History     Gravida   0    Para   0    Term   0    Preterm   0    AB   0    Living   0       SAB   0    TAB   0    Ectopic   0    Molar   0    Multiple   0    Live Births   0              Past Surgical History:   Procedure Laterality Date   ??? BREAST BIOPSY Right 11/03/2017    benign   ??? BREAST BIOPSY Left 11/03/2017    x 2 both malignant    ??? CHEMOTHERAPY Left 11/30/2017   ??? CORONARY ANGIOPLASTY WITH STENT PLACEMENT  2014    2 DES   ??? IR INSERT PORT AGE GREATER THAN 5 YRS  12/02/2017    IR INSERT PORT AGE GREATER THAN 5 YRS 12/02/2017 Rush Barer, MD IMG VIR [facility]   ??? MASTECTOMY Left 03/2018   ??? PR COLONOSCOPY W/BIOPSY SINGLE/MULTIPLE N/A 03/02/2018    Procedure: COLONOSCOPY, FLEXIBLE, PROXIMAL TO SPLENIC FLEXURE; WITH BIOPSY, SINGLE OR MULTIPLE;  Surgeon: Bronson Curb, MD;  [facility];  Service: Gastroenterology   ??? PR DRAINAGE OF HEMATOMA/FLUID  Left 03/19/2018    Procedure: INCISION & DRAINAGE OF HEMATOMA, SEROMA OR FLUID COLLECTION;  Surgeon: Aris Everts, MD;  Location: MAIN OR [facility];  Service: Surgical Oncology Breast   ??? PR MASTECTOMY, MODIFIED RADICAL Left 03/16/2018    Procedure: MASTECTOMY, MODIFIED RADICAL, INCLUDE AXILLARY LYMPH NODES, W/WO PECTORALIS MINOR, EXCLUDE PECTORALIS MAJOR;  Surgeon: Aris Everts, MD;  Location: MAIN [facility];  Service: Surgical Oncology Breast   ??? SKIN BIOPSY     ??? TOTAL ABDOMINAL HYSTERECTOMY W/ BILATERAL SALPINGOOPHORECTOMY  1985     Patient Active Problem List   Diagnosis   ??? HTN   ??? Pure hypercholesterolemia (RAF-HCC)   ??? Vitamin D deficiency   ??? Tobacco use     ??? Goiter (RAF-HCC)   ??? Problem with medical care compliance   ??? Coronary Artery Disease s/p PCI to RCA   ??? Situational depression   ??? Heart failure (CMS-HCC)   ??? Focal epilepsy with impairment of consciousness, intractable (CMS-HCC)   ??? Malignant neoplasm of upper-outer quadrant of left breast in female, estrogen receptor negative (CMS-HCC)   ??? Invasive ductal carcinoma of breast, female, left (CMS-HCC)   ??? Falls frequently   ??? Diarrhea   ??? 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 Anemia   ??? Anemia in neoplastic disease     Immunization History   Administered Date(s) Administered   ??? INFLUENZA TIV (TRI) 28MO+ W/ PRESERV (IM) 05/12/2017   ??? INFLUENZA TIV (TRI) PF (IM) 06/30/2012   ??? Influenza Vaccine Quad (IIV4 PF) 22mo+ injectable 05/19/2013, 09/04/2014, 09/26/2015, 05/12/2017, 05/10/2018   ??? Influenza Virus Vaccine, unspecified formulation 09/25/2016   ??? PNEUMOCOCCAL POLYSACCHARIDE 23 09/26/2015   ??? PPD Test 12/14/2017   ??? Pneumococcal Conjugate 13-Valent 11/15/2013   ??? TdaP 12/05/2009   ??? ZOSTAVAX - ZOSTER VACCINE, LIVE, SQ 08/22/2011       Health Maintenance   Topic Date Due   ??? Zoster Vaccines (2 of 3) 10/17/2011   ??? Influenza Vaccine (1) 05/03/2019   ??? DTaP/Tdap/Td Vaccines (2 - Td) 12/06/2019   ??? Mammogram Start Age 30  02/20/2020   ??? Serum Creatinine Monitoring  03/27/2020   ??? Potassium Monitoring  03/27/2020   ??? Pneumococcal Vaccines (2 of 2 - PPSV23) 09/25/2020   ??? DEXA Scan-Start Age 92  02/27/2022   ??? Colonoscopy  03/02/2028   ??? Hepatitis C Screen  Completed       Medications:  Current Outpatient Medications   Medication Sig Dispense Refill   ??? acetaminophen (TYLENOL) 500 MG tablet Take 500 mg by mouth every four (4) hours as needed for pain.     ??? amLODIPine (NORVASC) 10 MG tablet Take 1 tablet (10 mg total) by mouth daily. 90 tablet 1   ??? aspirin 81 MG chewable tablet Chew 81 mg once daily.      ??? atorvastatin (LIPITOR) 40 MG tablet TAKE 1 TABLET EVERY DAY 90 tablet 3   ??? betamethasone dipropionate (DIPROLENE) 0.05 % ointment Apply to rash twice a day 90 g 3   ??? capecitabine (XELODA) 500 MG tablet Take 2 tablets (1,000mg ) by mouth twice daily for 14 days, then HOLD for 7 days. 56 tablet 1   ??? carvedilol (COREG) 25 MG tablet TAKE 1 TABLET TWICE DAILY 180 tablet 3   ??? clonazePAM (KLONOPIN) 1 MG tablet Take 1 tablet (1 mg total) by mouth two (2) times a day. 60 tablet 5   ??? furosemide (LASIX)  20 MG tablet Take 1 tablet (20 mg total) by mouth daily as needed for swelling. 15 tablet 3   ??? gabapentin (NEURONTIN) 300 MG capsule TAKE 1 CAPSULE BY MOUTH 3 TIMES DAILY 270 capsule 3   ??? levETIRAcetam (KEPPRA) 500 MG tablet Take 2 tablets by mouth in the morning and 3 tablets at night. (Patient taking differently: Take 1.5 tablets by mouth in the morning and 1.5 tablets at night.) 450 tablet 4   ??? loperamide HCl (IMODIUM A-D ORAL) Take 6 tablets by mouth daily as needed.      ??? losartan (COZAAR) 100 MG tablet Take 1 tablet (100 mg total) by mouth daily. 90 tablet 3   ??? mometasone (ELOCON) 0.1 % cream Apply to left chest twice daily during radiation treatment 45 g 1   ??? potassium chloride (KLOR-CON) 10 MEQ CR tablet Take 1 tablet (10 mEq total) by mouth daily. Only take this if you are taking the furosemide (lasix) 30 tablet 11   ??? fluconazole (DIFLUCAN) 200 MG tablet Take 2 tablets (400 mg total) by mouth once. for 1 dose 2 tablet 0   ??? fluconazole (DIFLUCAN) 200 MG tablet Take 2 tablets (400mg ) by mouth today and then Take 1 tablet (200 mg total) by mouth daily. for 13 days 15 tablet 0   ??? meTHIMazole (TAPAZOLE) 5 MG tablet Take 1 tablet (5 mg total) by mouth two (2) times a day. (Patient not taking: Reported on 03/28/2019) 180 tablet 1   ??? prochlorperazine (COMPAZINE) 10 MG tablet Take 10 mg by mouth every six (6) hours as needed for nausea.     ??? silver sulfaDIAZINE (SILVADENE, SSD) 1 % cream Apply to affected area daily (Patient not taking: Reported on 03/28/2019) 50 g 1     No current facility-administered medications for this visit.          Objective:     VITAL SIGNS:    Vitals:    03/30/19 0858   BP: 160/80   Pulse: 56   Resp: 12   Temp: 36.8 ??C (98.2 ??F)   TempSrc: Oral   SpO2: 97%   Weight: 52.6 kg (116 lb)   Height: 157 cm (5' 1.81)     Wt Readings from Last 2 Encounters:   03/30/19 52.6 kg (116 lb)   03/28/19 52.8 kg (116 lb 4.8 oz)       GENERAL: Chronically ill-appearing, thin,, alert, in NAD  HEAD: Normocephalic and atraumatic  NECK: Supple w/o adenopathy, thyromegaly or masses  CHEST: Breath sounds distant but clear to auscultation bilaterally.  Respirations are unlabored.  No chest wall deformity or tenderness.  HEART: RRR .  Grade 2 systolic murmur heard along left sternal border.  Questionable S4  VASCULAR: Peripheral pulses 2+ and symmetric.  Carotid upstroke 2+ and symmetric.  0 to trace dependent edema bilateral ankles.  She has significant swelling in her left upper extremity representing lymphedema secondary to modified radical mastectomy  INTEGUMENT: Skin is warm and dry.  No rashes or concerning skin lesions.        POCT LABS AND TESTS:  No results found for this or any previous visit (from the past 24 hour(s)).

## 2019-03-30 NOTE — Unmapped (Addendum)
[facility] Refill Coordination Note    Specialty Medication(s) to be Shipped:   Hematology/Oncology: Capecitabine 500mg        Sonya Williams, DOB: March 24, 1951  Phone: [phone] (home)       All above HIPAA information was verified with patient.     Completed refill call assessment today to schedule patient's medication shipment from the [facility] [phone]).       Specialty medication(s) and dose(s) confirmed: Regimen is correct and unchanged.   Changes to medications: Sonya Williams reports no changes at this time.  Changes to insurance: No  Questions for the pharmacist: No    Confirmed patient received Welcome Packet with first shipment. The patient will receive a drug information handout for each medication shipped and additional FDA Medication Guides as required.       DISEASE/MEDICATION-SPECIFIC INFORMATION        N/A    SPECIALTY MEDICATION ADHERENCE     Medication Adherence    Patient reported X missed doses in the last month: 0  Specialty Medication: capecitabine 500 mg  Patient is on additional specialty medications: No  Patient is on more than two specialty medications: No  Any gaps in refill history greater than 2 weeks in the last 3 months: no  Demonstrates understanding of importance of adherence: yes  Informant: patient  Reliability of informant: reliable  Provider-estimated medication adherence level: good  Confirmed plan for next specialty medication refill: delivery by pharmacy          Refill Coordination    Has the Patients' Contact Information Changed: No  Is the Shipping Address Different: No           capecitabine 500 mg  : 0 days of medicine on hand       SHIPPING     Shipping address confirmed in Epic.     Delivery Scheduled: Yes, Expected medication delivery date: 04/01/2019.     Medication will be delivered via Same Day Courier to the home address in [employer] [place].    Jorje Guild   [facility] Specialty Technician

## 2019-03-31 NOTE — Unmapped (Addendum)
Continue your present dosage of Lasix (furosemide) taking 80mg  daily.  Continue with the increased dosage of potassium.  Get labs this week when you go to the hospital.     Please do not add salt to your foods and avoid foods high in sodium.     Change the wellness visit to 2 months and follow up at that time.

## 2019-03-31 NOTE — Unmapped (Signed)
Assessment:        1. Congestive heart failure, unspecified HF chronicity, unspecified heart failure type (CMS-HCC)    2. Long-term use of high-risk medication    3. Essential hypertension    4. Dependent edema            Plan:        Patient instructions:   Continue your present dosage of Lasix (furosemide) taking 80mg  daily.  Continue with the increased dosage of potassium.  Get labs this week when you go to the hospital.     Please do not add salt to your foods and avoid foods high in sodium.     Change the wellness visit to 2 months and follow up at that time.     Barriers to goals identified and addressed. Pertinent handouts were given today and reviewed with the patient as indicated.  The Care Plan and Self-Management goals have been included on the AVS and the AVS has been printed. Any outside resources or referrals needed at this time are noted above.  Any new medications prescribed have been discussed, and side effects have been addressed. Have assessed the patient's understanding, response, and barriers to adherence to medications. Patient voiced understanding and all questions have been answered to satisfaction.        Subjective:     Sonya Williams is a 68 y.o. female.    I spent 11 minutes on the phone with the patient. I spent an additional 3 minutes on pre- and post-visit activities.     The patient was physically located in [place] or a state in which I am permitted to provide care. The patient and/or parent/guardian understood that s/he may incur co-pays and cost sharing, and agreed to the telemedicine visit. The visit was reasonable and appropriate under the circumstances given the patient's presentation at the time.    The patient and/or parent/guardian has been advised of the potential risks and limitations of this mode of treatment (including, but not limited to, the absence of in-person examination) and has agreed to be treated using telemedicine. The patient's/patient's family's questions regarding telemedicine have been answered.     If the visit was completed in an ambulatory setting, the patient and/or parent/guardian has also been advised to contact their provider???s office for worsening conditions, and seek emergency medical treatment and/or call 911 if the patient deems either necessary.      Patient stated that no one else would be present during the phone visit.  If someone will be present patient stated they would be OK with them hearing the conversation.    HPI:  This visit is for: Follow up of LE edema.  At last visit she was complaining of increased LE edema but denied SOB, CP.  She is also having L arm swelling without redness, warmth or tenderness.  She is s/p L mastectomy.     At last visit her lasix was increased to 80mg  QAM and KCl was increased.  She is not having irregular heart beats, muscle cramps.  Her swelling has improved and her weight at home is down to 109 down from 116.  She still gets some afternoon swelling. She does wear compression hose. She notes her sister does her cooking so she does not know how much Na is in her food.  She states she avoids high Na foods. Her BPs are better but systolics remain about 160.    The following portions of the patient's history were reviewed and  updated as appropriate: allergies, current medications, past family history, past medical history, past social history, past surgical history and problem list.    Review of Systems  Pertinent items are noted in HPI.     Little interest or pleasure in doing things: Not at all    Feeling down, depressed, or hopeless; (age 35-17) Feeling down, depressed, irritable, or hopeless: Not at all    Trouble falling or staying asleep, or sleeping too much: Not at all    Feeling tired or having little energy: Several days    Poor appetite or overeating; (age 41-17) Poor appetite, weight loss or overeating: Not at all   Feeling bad about yourself - or that you are a failure or have let yourself or your family down; (age 70-17) Feeling bad about yourself - or feeling that you are a failure, or that you have let yourself or your family down: Not at all   Trouble concentrating on things, such as reading the newspaper or watching television; (age 58-17) Trouble concentrating on things like schoolwork, reading or watching TV: Not at all   Moving or speaking so slowly that other people could have noticed. Or the opposite - being so fidgety or restless that you have been moving around a lot more than usual: Not at all      Thoughts that you would be better off dead, or of hurting yourself in some way: Not at all   PHQ-9 TOTAL SCORE: 1       PHQ-9 Total Score Depression Severity:: None-Minimal  How difficult have these problems made it to do work, take care of things at home, or get along with other people?  Not difficult at all    PHQ-9 PHQ-9 TOTAL SCORE   04/04/2019 1   12/24/2018 5   05/10/2018 6   12/14/2017 3   09/14/2017 1          Objective:      Physical Exam:    Wt Readings from Last 6 Encounters:   03/30/19 52.6 kg (116 lb)   03/28/19 52.8 kg (116 lb 4.8 oz)   03/07/19 50.9 kg (112 lb 4.8 oz)   10/19/18 48.2 kg (106 lb 4.8 oz)   10/12/18 46.7 kg (102 lb 14.4 oz)   09/06/18 47.9 kg (105 lb 8 oz)       Laboratory Values:  Results for orders placed or performed in visit on 03/28/19   Comprehensive Metabolic Panel   Result Value Ref Range    Sodium 143 135 - 145 mmol/L    Potassium 3.7 3.5 - 5.0 mmol/L    Chloride 110 (H) 98 - 107 mmol/L    Anion Gap 7 7 - 15 mmol/L    CO2 26.0 22.0 - 30.0 mmol/L    BUN 22 (H) 7 - 21 mg/dL    Creatinine 8.46 (H) 0.60 - 1.00 mg/dL    BUN/Creatinine Ratio 20     EGFR CKD-EPI Non-African American, Female 53 (L) >=60 mL/min/1.54m2    EGFR CKD-EPI African American, Female 61 >=60 mL/min/1.83m2    Glucose 94 70 - 179 mg/dL    Calcium 9.2 8.5 - 96.2 mg/dL    Albumin 4.0 3.5 - 5.0 g/dL    Total Protein 6.9 6.5 - 8.3 g/dL    Total Bilirubin 0.7 0.0 - 1.2 mg/dL    AST 27 14 - 38 U/L    ALT 9 <35 U/L    Alkaline Phosphatase 80 38 - 126 U/L  Comprehensive Metabolic Panel   Result Value Ref Range    Sodium 142 135 - 145 mmol/L    Potassium 4.0 3.5 - 5.0 mmol/L    Chloride 110 (H) 98 - 107 mmol/L    Anion Gap 7 7 - 15 mmol/L    CO2 25.0 22.0 - 30.0 mmol/L    BUN 21 7 - 21 mg/dL    Creatinine 1.61 (H) 0.60 - 1.00 mg/dL    BUN/Creatinine Ratio 20     EGFR CKD-EPI Non-African American, Female 54 (L) >=60 mL/min/1.24m2    EGFR CKD-EPI African American, Female 35 >=60 mL/min/1.96m2    Glucose 91 70 - 179 mg/dL    Calcium 9.4 8.5 - 09.6 mg/dL    Albumin 4.2 3.5 - 5.0 g/dL    Total Protein 7.2 6.5 - 8.3 g/dL    Total Bilirubin 0.7 0.0 - 1.2 mg/dL    AST 22 14 - 38 U/L    ALT 9 <35 U/L    Alkaline Phosphatase 80 38 - 126 U/L   Pro-BNP   Result Value Ref Range    PRO-BNP 6,900.0 (H) 0.0 - 353.0 pg/mL   CBC w/ Differential   Result Value Ref Range    WBC 3.3 (L) 4.5 - 11.0 10*9/L    RBC 2.73 (L) 4.00 - 5.20 10*12/L    HGB 10.0 (L) 13.5 - 16.0 g/dL    HCT 04.5 (L) 40.9 - 46.0 %    MCV 118.9 (H) 80.0 - 100.0 fL    MCH 36.7 (H) 26.0 - 34.0 pg    MCHC 30.8 (L) 31.0 - 37.0 g/dL    RDW 81.1 (H) 91.4 - 15.0 %    MPV 8.4 7.0 - 10.0 fL    Platelet 105 (L) 150 - 440 10*9/L    Neutrophils % 59.9 %    Lymphocytes % 21.5 %    Monocytes % 7.0 %    Eosinophils % 8.4 %    Basophils % 0.5 %    Absolute Neutrophils 2.0 2.0 - 7.5 10*9/L    Absolute Lymphocytes 0.7 (L) 1.5 - 5.0 10*9/L    Absolute Monocytes 0.2 0.2 - 0.8 10*9/L    Absolute Eosinophils 0.3 0.0 - 0.4 10*9/L    Absolute Basophils 0.0 0.0 - 0.1 10*9/L    Large Unstained Cells 3 0 - 4 %    Macrocytosis Marked (A) Not Present    Anisocytosis Moderate (A) Not Present    Hypochromasia Moderate (A) Not Present   Morphology Review   Result Value Ref Range    Smear Review Comments See Comment (A) Undefined    Acanthocytes Present (A) Not Present   CBC w/ Differential   Result Value Ref Range    WBC 3.3 (L) 4.5 - 11.0 10*9/L    RBC 2.75 (L) 4.00 - 5.20 10*12/L    HGB 10.1 (L) 13.5 - 16.0 g/dL    HCT 78.2 (L) 95.6 - 46.0 %    MCV 119.0 (H) 80.0 - 100.0 fL    MCH 36.6 (H) 26.0 - 34.0 pg    MCHC 30.8 (L) 31.0 - 37.0 g/dL    RDW 21.3 (H) 08.6 - 15.0 %    MPV 8.6 7.0 - 10.0 fL    Platelet 107 (L) 150 - 440 10*9/L    Neutrophils % 64.2 %    Lymphocytes % 16.6 %    Monocytes % 7.1 %    Eosinophils % 8.6 %    Basophils % 0.7 %  Absolute Neutrophils 2.1 2.0 - 7.5 10*9/L    Absolute Lymphocytes 0.5 (L) 1.5 - 5.0 10*9/L    Absolute Monocytes 0.2 0.2 - 0.8 10*9/L    Absolute Eosinophils 0.3 0.0 - 0.4 10*9/L    Absolute Basophils 0.0 0.0 - 0.1 10*9/L    Large Unstained Cells 3 0 - 4 %    Macrocytosis Marked (A) Not Present    Anisocytosis Moderate (A) Not Present    Hypochromasia Moderate (A) Not Present

## 2019-04-01 MED ORDER — CAPECITABINE 500 MG TABLET
ORAL_TABLET | 3 refills | 0 days | Status: CP
Start: 2019-04-01 — End: ?
  Filled 2019-04-05: qty 56, 21d supply, fill #0

## 2019-04-01 NOTE — Unmapped (Signed)
Sonya Williams 's Xeloda shipment will be delayed due to No refills We have contacted the patient and communicated the delivery change to patient/caregiver We will call the patient to reschedule the delivery upon resolution. We have confirmed the delivery date as NA .

## 2019-04-01 NOTE — Unmapped (Signed)
Hi,     Patient contacted the Communication Center requesting to speak with the care team of [name] to discuss:    Appointment on 04/06/2019 with Radiation Oncology was cancelled my [name] and patient is requesting a call back from care team explain why it was cancelled.    Please contact [name] at [phone].    Check Indicates criteria has been reviewed and confirmed with the patient:    [x]  Preferred Name   [x]  DOB and/or MR#  [x]  Preferred Contact Method  [x]  Phone Number(s)   []  MyChart     Thank you,   [name]  [facility]   [phone]

## 2019-04-04 ENCOUNTER — Institutional Professional Consult (permissible substitution): Admit: 2019-04-04 | Discharge: 2019-04-05 | Payer: MEDICARE | Attending: Family Medicine | Primary: Family Medicine

## 2019-04-04 DIAGNOSIS — I509 Heart failure, unspecified: Principal | ICD-10-CM

## 2019-04-04 DIAGNOSIS — R609 Edema, unspecified: Secondary | ICD-10-CM

## 2019-04-04 DIAGNOSIS — Z79899 Other long term (current) drug therapy: Secondary | ICD-10-CM

## 2019-04-04 DIAGNOSIS — I10 Essential (primary) hypertension: Secondary | ICD-10-CM

## 2019-04-04 MED ORDER — FUROSEMIDE 40 MG TABLET
ORAL_TABLET | Freq: Every day | ORAL | 5 refills | 30 days | Status: CP
Start: 2019-04-04 — End: ?
  Filled 2019-04-06: qty 60, 30d supply, fill #0

## 2019-04-04 NOTE — Unmapped (Signed)
I spoke with Ms Janee Morn she would like her capecitabine sent out tomorrow same day courier.  Deliver 04/05/2019

## 2019-04-05 MED FILL — CAPECITABINE 500 MG TABLET: 21 days supply | Qty: 56 | Fill #0 | Status: AC

## 2019-04-05 NOTE — Unmapped (Signed)
Spoke to pt and she is aware of the 8.25 PHONE visit with Muss

## 2019-04-06 ENCOUNTER — Ambulatory Visit: Admit: 2019-04-06 | Discharge: 2019-04-06 | Payer: MEDICARE

## 2019-04-06 ENCOUNTER — Ambulatory Visit: Admit: 2019-04-06 | Discharge: 2019-04-06 | Payer: MEDICARE | Attending: Surgical Oncology | Primary: Surgical Oncology

## 2019-04-06 DIAGNOSIS — C50412 Malignant neoplasm of upper-outer quadrant of left female breast: Principal | ICD-10-CM

## 2019-04-06 DIAGNOSIS — Z1231 Encounter for screening mammogram for malignant neoplasm of breast: Secondary | ICD-10-CM

## 2019-04-06 DIAGNOSIS — Z171 Estrogen receptor negative status [ER-]: Secondary | ICD-10-CM

## 2019-04-06 MED FILL — FUROSEMIDE 40 MG TABLET: 30 days supply | Qty: 60 | Fill #0 | Status: AC

## 2019-04-06 NOTE — Unmapped (Signed)
Patient Name: Sonya Williams  Patient Age: 68 y.o.  Encounter Date: 04/06/2019    Referring Physician:   Referred Self  No address on file    Primary Care Provider:  Garnette Czech, MD    Supervising Physician:  Dr. Lucretia Roers    Diagnosis:  1. Malignant neoplasm of upper-outer quadrant of left breast in female, estrogen receptor negative (CMS-HCC)      Cancer Staging  Malignant neoplasm of upper-outer quadrant of left breast in female, estrogen receptor negative (CMS-HCC)  Staging form: Breast, AJCC 8th Edition  - Clinical stage from 11/11/2017: Stage IIIB (cT2, cN1(f), cM0, G2, ER: Negative, PR: Negative, HER2: Equivocal) - Signed by Rudie Meyer, ANP on 11/11/2017  - Pathologic stage from 03/16/2018: No Stage Recommended (ypT1c, pN1a, cM0, G3, ER-, PR-, HER2-) - Signed by Marcy Panning, FNP on 06/02/2018      Follow Up Note:    Sonya Williams is a 68 y.o. female who is seen here for routine follow-up.  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  She was diagnosed in March 2019.  She completed her neoadjuvant chemotherapy and is now post radiation treatment.  She had a modified radical mastectomy she is continuing on Xeloda quite well and not reporting any significant hand-foot syndrome mucositis or diarrhea.  Had imaging studies today of the contralateral right breast today.  Changes in her past medical history is that they started her back on Lasix and she reports about a 12 pound weight loss over the last week or so including reduction in the lymphedema on her left arm.    Review of Systems:  Is notable for some fatigue and arthritic complaints but no other constitutional complaints    Changes in self breast exam:   None per patient    PMH:   Past Medical History:   Diagnosis Date   ??? Breast cancer (CMS-HCC)     left br ca   ??? CAD S/P percutaneous coronary angioplasty 09/2012   ??? Eczema    ??? Endometriosis    ??? Goiter NOS    ??? Heart attack (CMS-HCC) 09/2012   ??? HTN    ??? Hyperlipidemia    ??? NSTEMI (non-ST elevated myocardial infarction) (CMS-HCC) 08/2015   ??? Osteoporosis    ??? SEIZURE DISORDER    ??? Tobacco use    ??? Vitamin D deficiency NOS        PSH:  Past Surgical History:   Procedure Laterality Date   ??? BREAST BIOPSY Right 11/03/2017    benign   ??? BREAST BIOPSY Left 11/03/2017    x 2 both malignant    ??? CHEMOTHERAPY Left 11/30/2017   ??? CORONARY ANGIOPLASTY WITH STENT PLACEMENT  2014    2 DES   ??? IR INSERT PORT AGE GREATER THAN 5 YRS  12/02/2017    IR INSERT PORT AGE GREATER THAN 5 YRS 12/02/2017 Rush Barer, MD IMG [facility]   ??? MASTECTOMY Left 03/2018   ??? PR COLONOSCOPY W/BIOPSY SINGLE/MULTIPLE N/A 03/02/2018    Procedure: COLONOSCOPY, FLEXIBLE, PROXIMAL TO SPLENIC FLEXURE; WITH BIOPSY, SINGLE OR MULTIPLE;  Surgeon: Bronson Curb, MD;  [facility];  Service: Gastroenterology   ??? PR DRAINAGE OF HEMATOMA/FLUID Left 03/19/2018    Procedure: INCISION & DRAINAGE OF HEMATOMA, SEROMA OR FLUID COLLECTION;  Surgeon: Aris Everts, MD;  Location: MA[facility];  Service: Surgical Oncology Breast   ??? PR MASTECTOMY, MODIFIED RADICAL Left 03/16/2018    Procedure: MASTECTOMY, MODIFIED RADICAL, INCLUDE AXILLARY LYMPH NODES, W/WO PECTORALIS MINOR, EXCLUDE PECTORALIS MAJOR;  Surgeon: Aris Everts, MD;  Location: MAIN OR [facility];  Service: Surgical Oncology Breast   ??? SKIN BIOPSY     ??? TOTAL ABDOMINAL HYSTERECTOMY W/ BILATERAL SALPINGOOPHORECTOMY  1985       Family History:  Family History   Problem Relation Age of Onset   ??? Cancer Mother         Stomach-mets to bone   ??? Stroke Father    ??? Hypertension Father    ??? Lung cancer Sister    ??? Alcohol Use Disorder Other    ??? Hypertension Other    ??? Cancer Maternal Grandmother    ??? Cancer Maternal Grandfather    ??? Cancer Paternal Grandmother    ??? Colon cancer Sister    ??? No Known Problems Daughter    ??? No Known Problems Paternal Grandfather    ??? Melanoma Neg Hx    ??? Basal cell carcinoma Neg Hx    ??? Squamous cell carcinoma Neg Hx    ??? Breast cancer Neg Hx    ??? BRCA 1/2 Neg Hx    ??? Endometrial cancer Neg Hx    ??? Ovarian cancer Neg Hx        Exam:  BSA: 1.47 meters squared  BP 185/90  - Pulse 56  - Temp 35.9 ??C (96.6 ??F) (Tympanic)  - Resp 16  - Wt 49.6 kg (109 lb 6.4 oz)  - SpO2 99%  - BMI 20.13 kg/m??   Pain Assessment: Pain scale 0    General Appearance:  No acute distress, well appearing and well nourished. A&0 x 3.   HEENT:  Conjuctiva and lids appear normal. Pupils equal and round,   sclera anicteric. Neck is supple. Trachea midline.  No JVD or supraclavicular adenopathy.    Breast:  Left mastectomy has hyper pigmentation post radiation but no evidence of recurrence on the left.  Right breast is ptotic without skin change mass nipple discharge.  She has an intact Port-A-Cath in the right upper chest wall   Axilla:  No adenopathy in the axilla bilaterally   Pulmonary:    Normal respiratory effort.     Cardiovascular:  Regular rate and rhythm.   Neurologic:  Lymphatic: No motor abnormalities noted.  Sensation grossly intact.  No cervical or supraclavicular LAD noted.     Diagnostic Studies:  @MAMMOFINDINGS @  No radiological evidence for malignancy on the right follow-up in a year    Assessment:  History of a stage IIIb left breast cancer that is triple negative she was diagnosed March 5 of 2019    She status post left modified radical mastectomy from March 16, 2018    Plan:  The patient is doing well from a breast cancer standpoint.  She continues to be followed for ongoing surveillance with post surgery chemotherapy.  We will plan on seeing her back for surgical surveillance in 1 year out at the [facility] office              The note was transcribed with Dragon and therefore may have errors in spelling

## 2019-04-13 NOTE — Unmapped (Signed)
[facility] Refill Coordination Note    Specialty Medication(s) to be Shipped:   Hematology/Oncology: Capecitabine 500 mg       Sonya Williams, DOB: May 14, 1951  Phone: [phone] (home)       All above HIPAA information was verified with patient.     Completed refill call assessment today to schedule patient's medication shipment from the [facility] [phone]).       Specialty medication(s) and dose(s) confirmed: Regimen is correct and unchanged.   Changes to medications: Joanmarie reports no changes at this time.  Changes to insurance: No  Questions for the pharmacist: No    Confirmed patient received Welcome Packet with first shipment. The patient will receive a drug information handout for each medication shipped and additional FDA Medication Guides as required.       DISEASE/MEDICATION-SPECIFIC INFORMATION        N/A    SPECIALTY MEDICATION ADHERENCE     Medication Adherence    Patient reported X missed doses in the last month: 0  Specialty Medication: capecitabine 500 mg  Patient is on additional specialty medications: No  Patient is on more than two specialty medications: No  Any gaps in refill history greater than 2 weeks in the last 3 months: no  Demonstrates understanding of importance of adherence: yes  Informant: patient  Reliability of informant: reliable  Confirmed plan for next specialty medication refill: delivery by pharmacy          Refill Coordination    Has the Patients' Contact Information Changed: No  Is the Shipping Address Different: No           capecitabine 500 mg  : 4 days of medicine on hand       SHIPPING     Shipping address confirmed in Epic.     Delivery Scheduled: Yes, Expected medication delivery date: 04/21/2019.     Medication will be delivered via Same Day Courier to the prescription address in Epic WAM.    Jorje Guild   [facility] Specialty Technician

## 2019-04-21 MED FILL — CAPECITABINE 500 MG TABLET: 21 days supply | Qty: 56 | Fill #1

## 2019-04-21 MED FILL — CAPECITABINE 500 MG TABLET: 21 days supply | Qty: 56 | Fill #1 | Status: AC

## 2019-04-22 MED FILL — CLONAZEPAM 1 MG TABLET: 30 days supply | Qty: 60 | Fill #2 | Status: AC

## 2019-04-22 MED FILL — GABAPENTIN 300 MG CAPSULE: 90 days supply | Qty: 270 | Fill #0 | Status: AC

## 2019-04-22 MED FILL — CLONAZEPAM 1 MG TABLET: ORAL | 30 days supply | Qty: 60 | Fill #2

## 2019-04-25 NOTE — Unmapped (Signed)
Video/Phone Visit  I spent 25 she is you are very nice to meet minutes on the phone with the patient. I spent an additional 15 minutes on pre- and post-visit activities.   Location: home  Participants: patient  Platform: Doximity  - The patient was physically located in [place] or a state in which I am permitted to provide care. The patient and/or parent/gauardian understood that s/he may incur co-pays and cost sharing, and agreed to the telemedicine visit. The visit was completed via phone and/or video, which was appropriate and reasonable under the circumstances given the patient's presentation at the time.  - The patient and/or parent/guardian has been advised of the potential risks and limitations of this mode of treatment (including, but not limited to, the absence of in-person examination) and has agreed to be treated using telemedicine. The patient's/patient's family's questions regarding telemedicine have been answered.   - If the phone/video visit was completed in an ambulatory setting, the patient and/or parent/guardian has also been advised to contact their provider???s office for worsening conditions, and seek emergency medical treatment and/or call 911 if the patient deems either necessary.      BREAST MEDICAL ONCOLOGY RETURN PROGRESS NOTE    Patient Name: Sonya Williams  Patient Age: 68 y.o.female  Encounter Date: 04/26/2019    Primary Care Provider:  Garnette Czech, MD    Referring Physician:  Felicity Coyer, MD    Breast Med/Onc:  Servando Salina, MD    PATIENT LOCATION DURING CALL:  HOME   OTHERS PRESENT DURING CALL: None  REASON FOR VISIT: Management of breast cancer.  CURRENT TREATMENT: Adjuvant Xeloda    ASSESSMENT   1. Malignant neoplasm of upper-outer quadrant of left breast in female, estrogen receptor negative (CMS-HCC)    ??This is a 68 y.o. year old female with a clinical stage IIIB (cT2cN1xM0),??infiltrating ductal carcinoma, grade 2, estrogen receptor negative, progesterone receptor negative and HER2 negativeebruary 2??of the left breast, diagnosed 11/03/2017.  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      She completed neoadjuvant weekly carboplatin with an AUC of 1.5, along with standard dosing paclitaxel at 80 mg/m2 for a planned total of 12 weeks of treatment. Only completed half the treatment prior to proceeding with surgical resection.  Treatment stopped because of hospitalization for diarrhea falls and seizures. She has significant residual cancer based on pathology from her July 2019 mastectomy with 1/8 nodes positive for disease.  Based on her RCB score she had an 80% chance of recurrence in 5 years without any further therapy, therefore she resumed therapy completing 4 cycles of dose dense AC without any major toxicity.  She is now status post radiation and started adjuvant Xeloda 1000 mg  twice daily and tolerating well, so will proceed to the next cycle of Xeloda. We will continue to follow her closely given her comorbid conditions, but at this time she is tolerating Xeloda quite well without significant hand-foot syndrome, mucositis or diarrhea.  No recurrence of seizures since February.    Cancer Staging  Malignant neoplasm of upper-outer quadrant of left breast in female, estrogen receptor negative (CMS-HCC)  Staging form: Breast, AJCC 8th Edition  - Clinical stage from 11/11/2017: Stage IIIB (cT2, cN1(f), cM0, G2, ER: Negative, PR: Negative, HER2: Equivocal) - Signed by Rudie Meyer, ANP on 11/11/2017  - Pathologic stage from 03/16/2018: No Stage Recommended (ypT1c,  pN1a, cM0, G3, ER-, PR-, HER2-) - Signed by Marcy Panning, FNP on 06/02/2018      PLAN     -Treatment goals: Curative    - Imaging; right mammogram April 06, 2019 [facility].  BI-RADS 2 and repeat in a year.  -Systemic therapy:   #6 cycle of Xeloda begun on 04/25/2019   Started first cycle of Xeloda 12/13/2018 1000 mg BID; tolerating well with stable labs.  We will continue with treatment based on the rationale below as noted by Dr. Garlon Hatchet.  Tolerating well.  Some discomfort in feet but no redness blistering or other symptoms will not modify doses.  Just started most recent cycle of 2 weeks on 1 week off at 1000 twice daily on 04/25/2019.  - Genetics: Seen by Herbert Pun and no genetic testing recommended  - Smoking: has tried numerous methods and continues to smoke probably a little less than a pack a day.  Was referred to smoking cessation clinic previously. No plans to quit yet.   - Complex partial seizures: Follows with neurology, last partial seizure 10/2018.  Followed-up with Dr. Loma Newton -02/10/2019, continues on Keppra, Clonazepam, Gabapentin  - Diarrhea: no more Last colonoscopy was in 03/2019 with benign pathology.  - Follow-up: Labs 3 weeks in [facility] for CBC and CMP 06/06/2019 labs and face-to-face visit with Riley Lam in [place].  Should be approximately done with therapy at that time.  Told to call in interim if issues and I gave her Amy Depue's phone number in case she needs to contact us earlier.  Does not have computer on my chart access.    INTERVAL HISTORY     Interval History:   Phone visit today.  Tolerating Xeloda well without diarrhea or hand-foot syndrome.  Of note her diarrhea which was a major issue throughout her initial chemotherapy treatments is resolved.  Patient has persistent problem with edema but is lost about 8 pounds of fluid related to diuretic therapy.  No hand-foot syndrome although a little bit of discomfort in her feet but no blistering or skin changes.  - Partial seizures:; last seizure was in February 2020 lasted 8 seconds she was at home and well supported by her sister any cares per her and is her POA.    - She continues to stay active at home performing her ADLs such as sweeping, washing clothes, and cooking when she is able.  She denies any recent falls in the past 6 months.    --Performance status 0-1 due to nonbreast cancer issues such as diarrhea      HEMATOLOGICAL / ONCOLOGICAL HISTORY:     Oncology History   Malignant neoplasm of upper-outer quadrant of left breast in female, estrogen receptor negative (CMS-HCC)   10/2017 -  Presenting Symptoms    Patient self-palpated left breast mass prompting Dx imaging.     10/28/2017 Interval Scan(s)    B/L Dx MMG: Left breast ~3.1 x 2.8 cm mass w/calcs UOQ. Grouped coarse heterogeneous calcifications in the upper outer quadrant of the right breast measuring 0.6 cm.   [place]: Left breast 2:00 position, 6CFN mass 2.8 x 1.3 x 2.6 cm with internal calcifications. There is an adjacent ill-defined hypoechoic area next to the primary mass measuring approximately 2.3 x 0.7 cm which may represent an extension of the mass versus satellite lesion.    [place]: Left axilla abnormal lymph node or mass measuring 2.0 x 0.9 x 1.2 cm. Multiple other normal-appearing lymph nodes are demonstrated.    Targeted  diagnostic ultrasound of the right breast in the upper-outer quadrant performed to further evaluate calcifications demonstrated on the mammogram due to the patient's difficulty with tolerating a possible stereotactic guided biopsy. The calcifications were not demonstrated sonographically. Normal breast parenchyma was demonstrated.     11/03/2017 Biopsy    Rt breast SBx: fibroadenoma w/calcs  Lt breast CBx: IDC, G2, microcalcs a/w carcinoma  Lt axillary LN: + metastatic adenoCA    Receptors: ER- PR- Her2 Eq 2+ FISH NON-amplified.       11/20/2017 Interval Scan(s)    PET  --Intensely FDG avid left breast invasive ductal carcinoma with intensely avid left axillary biopsy proven nodal metastasis.  -- Moderately avid right breast mass is superior/anterior to the right breast clip, likely small hemorrhage and inflammation given recent stereotactic biopsy. Mild right axillary uptake is likely reactive.   -- No evidence of distant metastatic disease.  -- Enlarged, heterogeneous thyroid without abnormal FDG uptake. Findings compatible with multinodular goiter, as was described on thyroid ultrasound 05/01/2010.      Other    Carbo/Taxol  - C1/12 12/21/17. Carbo AUC 1, paclitaxel 80/m2.  Breast mass responding well to treatment.  - C3/12: 01/04/18 Carbo escalated to 1.5 AUC, paclitaxel 80 mg/m?? some decrease in hemoglobin.    - C4/12:  In view of ANC 1.2 decrease carbo back to AUC 1.0 this week and next  - C6/12: 01/27/18 HOLD Carbo due to Cr 1.75, only paclitaxel 80 mg/m2 given  - C7/12: 02/03/18 HOLD carbo and paclitaxel due to ANC 900. At this point, doubt that she will tolerate additional chemotherapy given her multiple medical co-morbidities     12/01/2017 - 01/31/2018 Chemotherapy    Chemotherapy Treatment    Treatment Goal Curative   Line of Treatment [No plan line of treatment]   Plan Name OP BREAST PACLItaxel WEEKLY / CARBOplatin EVERY 3 WEEKS   Start Date 12/21/2017   End Date 01/27/2018   Provider Fayne Norrie Yazan Gatling, MD   Chemotherapy dexamethasone (DECADRON) tablet 20 mg, 20 mg, Oral, Once, 2 of 4 cycles  Administration: 20 mg (12/21/2017), 20 mg (12/28/2017), 20 mg (01/04/2018), 20 mg (01/11/2018), 20 mg (01/18/2018), 20 mg (01/27/2018)  CARBOplatin (PARAPLATIN) 117.6 mg in sodium chloride (NS) 0.9 % 100 mL IVPB, 117.6 mg (92.7 % of original dose 126.9 mg), Intravenous, Once, 2 of 4 cycles  Dose modification:   (original dose 126.9 mg, Cycle 1)  Administration: 74.8 mg (12/21/2017), 89.1 mg (12/28/2017), 123 mg (01/04/2018), 85.3 mg (01/11/2018), 76.8 mg (01/18/2018)  PACLItaxel (TAXOL) 126.42 mg in sodium chloride 0.9% NON-PVC (NS) 0.9 % 250 mL IVPB, 80 mg/m2 = 126.42 mg, Intravenous, Once, 2 of 4 cycles  Administration: 126.42 mg (12/21/2017), 126.42 mg (12/28/2017), 126.42 mg (01/04/2018), 126.42 mg (01/11/2018), 126.42 mg (01/18/2018), 126.42 mg (01/27/2018)        03/16/2018 Surgery    Left mastectomy+ALND: IDC, T=75mm, G3, Probable to Definite response to NACT. 1/18 (macromets=88mm) RCB: II-3.235     03/16/2018 -  Cancer Staged    Staging form: Breast, AJCC 8th Edition  - Pathologic stage from 03/16/2018: No Stage Recommended (ypT1c, pN1a, cM0, G3, ER-, PR-, HER2-) - Signed by Marcy Panning, FNP on 06/02/2018       06/02/2018 - 07/26/2018 Chemotherapy    OP BREAST AC (DOSE DENSE) x 4 cycles  DOXOrubicin 60 mg/m2, Cyclophosphamide 600 mg/m2 every 14 days       08/11/2018 - 09/27/2018 Radiation    approximate date completions. Dr. Carles Collet HBO  12/13/2018 -  Chemotherapy    Capecitabine 1000 mg po BID x 14 days then 7 days off  Start date: 12/13/18  Pharmacy:  [facility]   Cycle 2 start date: 01/03/19     Invasive ductal carcinoma of breast, female, left (CMS-HCC)   12/21/2017 - 01/27/2018 Chemotherapy    Taxol Carbo with major toxicity         OTHER PAST MEDICAL HISTORY:     Patient Active Problem List   Diagnosis   ??? HTN   ??? Pure hypercholesterolemia (RAF-HCC)   ??? Vitamin D deficiency   ??? Tobacco use   ??? Goiter (RAF-HCC)   ??? Problem with medical care compliance   ??? Coronary Artery Disease s/p PCI to RCA   ??? Depression   ??? (HFpEF) heart failure with preserved ejection fraction (CMS-HCC)   ??? Focal epilepsy with impairment of consciousness, intractable (CMS-HCC)   ??? Malignant neoplasm of upper-outer quadrant of left breast in female, estrogen receptor negative (CMS-HCC)   ??? Invasive ductal carcinoma of breast, female, left (CMS-HCC)   ??? Falls frequently   ??? Diarrhea   ??? 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 Anemia   ??? Anemia in neoplastic disease       ALLERGIES:     Iodinated contrast media and Other    MEDICATIONS:       Outpatient Encounter Medications as of 04/26/2019   Medication Sig Dispense Refill   ??? acetaminophen (TYLENOL) 500 MG tablet Take 500 mg by mouth every four (4) hours as needed for pain.     ??? amLODIPine (NORVASC) 10 MG tablet Take 1 tablet (10 mg total) by mouth daily. 90 tablet 1   ??? aspirin 81 MG chewable tablet Chew 81 mg once daily.      ??? atorvastatin (LIPITOR) 40 MG tablet TAKE 1 TABLET EVERY DAY 90 tablet 3   ??? betamethasone dipropionate (DIPROLENE) 0.05 % ointment Apply to rash twice a day 90 g 3   ??? capecitabine (XELODA) 500 MG tablet Take 2 tablets (1,000mg ) by mouth twice daily for 14 days, then HOLD for 7 days. 56 tablet 3   ??? carvedilol (COREG) 25 MG tablet TAKE 1 TABLET TWICE DAILY 180 tablet 3   ??? clonazePAM (KLONOPIN) 1 MG tablet Take 1 tablet (1 mg total) by mouth two (2) times a day. 60 tablet 5   ??? fluconazole (DIFLUCAN) 200 MG tablet Take 2 tablets (400 mg total) by mouth once. for 1 dose 2 tablet 0   ??? fluconazole (DIFLUCAN) 200 MG tablet Take 2 tablets (400mg ) by mouth today and then Take 1 tablet (200 mg total) by mouth daily. for 13 days 15 tablet 0   ??? furosemide (LASIX) 40 MG tablet Take 2 tablets (80 mg total) by mouth daily. 60 tablet 5   ??? gabapentin (NEURONTIN) 300 MG capsule TAKE 1 CAPSULE BY MOUTH  three TIMES DAILY 270 capsule 3   ??? levETIRAcetam (KEPPRA) 500 MG tablet Take 2 tablets by mouth in the morning and 3 tablets at night. (Patient taking differently: Take 1.5 tablets by mouth in the morning and 1.5 tablets at night.) 450 tablet 4   ??? loperamide HCl (IMODIUM A-D ORAL) Take 6 tablets by mouth daily as needed.      ??? losartan (COZAAR) 100 MG tablet Take 1 tablet (100 mg total) by mouth daily. 90 tablet 3   ??? meTHIMazole (TAPAZOLE) 5 MG tablet Take 1  tablet (5 mg total) by mouth two (2) times a day. (Patient not taking: Reported on 03/28/2019) 180 tablet 1   ??? mometasone (ELOCON) 0.1 % cream Apply to left chest twice daily during radiation treatment 45 g 1   ??? potassium chloride (KLOR-CON) 10 MEQ CR tablet Take 1 tablet (10 mEq total) by mouth daily. Only take this if you are taking the furosemide (lasix) 30 tablet 11   ??? prochlorperazine (COMPAZINE) 10 MG tablet Take 10 mg by mouth every six (6) hours as needed for nausea.     ??? silver sulfaDIAZINE (SILVADENE, SSD) 1 % cream Apply to affected area daily (Patient not taking: Reported on 03/28/2019) 50 g 1   ??? [DISCONTINUED] phenytoin (DILANTIN) 100 MG ER capsule Take 3 capsules (300 mg total) by mouth nightly. 270 capsule 3     No facility-administered encounter medications on file as of 04/26/2019.      Physical Exam not performed as phone visit.

## 2019-04-26 ENCOUNTER — Institutional Professional Consult (permissible substitution)
Admit: 2019-04-26 | Discharge: 2019-04-27 | Payer: MEDICARE | Attending: Hematology & Oncology | Primary: Hematology & Oncology

## 2019-04-26 DIAGNOSIS — C50412 Malignant neoplasm of upper-outer quadrant of left female breast: Principal | ICD-10-CM

## 2019-04-26 DIAGNOSIS — Z171 Estrogen receptor negative status [ER-]: Secondary | ICD-10-CM

## 2019-04-26 NOTE — Unmapped (Addendum)
It was a pleasure speaking with you today.     Continue taking your Xeloda as you have been doing with 2 weeks of taking the pills and 1 week off.  I will arrange for you to have labs in 2 to 3 weeks in [place] and a visit with Marisue Ivan in 6 to 8 weeks.  Be safe and let us know if you have any problems    Please call our Nurse Navigator: Lucendia Herrlich, [phone] if you have any interval questions or concerns:    For appointments & questions Monday through Friday 8 AM??? 5 PM   please call [phone] or Toll free [phone].    On Nights, Weekends and Holidays  Call [phone] and ask for the oncologist on call.    [facility]  [address]  [place], [place] [address]  [email or web address]    FOR ACCURATE AND UP-TO-DATE INFORMATION ON CANCER I SUGGEST:  a.       American Society of Clinical Oncology: http://www.cancer.net/  I think the best overall. Provides excellent information on all aspects of cancer prevention, treatment, and survivorship.  b.       American Cancer society: https://www.cancer.org/cancer.html  c.       National Cancer Institute: Also good for finding clinical trials: http://www.walter.org/  d.       [facility]: Very well presented information including patient handouts for specific chemotherapy treatment plans:  http://www.bccancer.bc.ca/  e.       HERBS AND SUPPLEMENTS:  If you are taking herbs and supplements please use the following website to determine their safety: Google: [facility] Alternative medications of go to link below [email or web address]    FOR CAREGIVERS: Follow this link for excellent information on care giving. [email or web address].fr    EXERCISE AND NUTRITION: It's important that you exercise to keep your muscles strong.  Walking is an excellent exercise and if you're not doing so we recommend that you walk 30 minutes or more a day - 5 times a week.  This is an excellent way to keep fit.    We recommend that you eat a healthy American diet with adequate servings of fruits and vegetables.  It's important to be aware of how many calories you were taking in each day so that you can maintain a reasonable body weight. If you are having trouble with your diet or with weight control please let us know and we can refer you to our nutritionist who can give you advice and help. This website may also be helpful DiscoHelp.si.    BONE HEALTH: We recommend taking Vitamin D 1000 units per day and 1200 mg of calcium per day. You can get more information on calcium intake by Googling: Calcium NIH, or going to the following site: https://ods.CakeDeveloper.com.cy. Exercising is also most important. Some patients may need medication for bone health and your doctor may discuss this with you.    GENERAL SURVEILLANCE PLAN:These are the national guidelines from ASCO and NCCN. The recommendations for follow-up care for breast cancer include regular physical examinations, mammograms, and breast self-examinations. The follow-up care may be provided by your oncologist or primary care doctor, as long as your primary care doctor has communicated with your oncologist about appropriate follow-up care. In addition, patients with a possible or known family history of breast cancer should be referred to a Dentist.    ? Visit your doctor every three to six months for the first three years after the first treatment, every six to 12  months for years four and five, and every year thereafter.    ? Schedule a mammogram one year after your first mammogram that led to diagnosis, but no earlier than six months after radiation therapy. Obtain a mammogram every six to 12 months thereafter.    ? Perform a breast self-examination every month. This procedure is not a substitute for a mammogram.    ? Continue to visit a gynecologist regularly. Women taking tamoxifen should report any vaginal bleeding to their doctor.    In addition to the plan outlined above, please call us if you experience:   ? New lumps in the breast   ? Bone pain   ? Chest pain   ? Abdominal pain   ? Shortness of breath or difficulty breathing   ? Persistent headaches   ? Persistent coughing   ? Rash on breast   ? Nipple discharge (liquid coming from the nipple)     For emergencies, evenings or weekends, please call [phone] and ask for oncology fellow on call. Reasons to call emergency for patients receiving chemotherapy may include:    Fever of 100.5 or greater  Nausea and/or vomiting not relieved with nausea medicine  Diarrhea or constipation not relieved with bowel regimen  Severe pain not relieved with usual pain regimen    Labs from today if done:  Appointment on 03/28/2019   Component Date Value Ref Range Status   ??? Sodium 03/28/2019 143  135 - 145 mmol/L Final   ??? Potassium 03/28/2019 3.7  3.5 - 5.0 mmol/L Final   ??? Chloride 03/28/2019 110* 98 - 107 mmol/L Final   ??? Anion Gap 03/28/2019 7  7 - 15 mmol/L Final   ??? CO2 03/28/2019 26.0  22.0 - 30.0 mmol/L Final   ??? BUN 03/28/2019 22* 7 - 21 mg/dL Final   ??? Creatinine 03/28/2019 1.08* 0.60 - 1.00 mg/dL Final   ??? BUN/Creatinine Ratio 03/28/2019 20   Final   ??? EGFR CKD-EPI Non-African American,* 03/28/2019 53* >=60 mL/min/1.73m2 Final   ??? EGFR CKD-EPI African American, Fem* 03/28/2019 61  >=60 mL/min/1.110m2 Final   ??? Glucose 03/28/2019 94  70 - 179 mg/dL Final   ??? Calcium 09/81/1914 9.2  8.5 - 10.2 mg/dL Final   ??? Albumin 78/29/5621 4.0  3.5 - 5.0 g/dL Final   ??? Total Protein 03/28/2019 6.9  6.5 - 8.3 g/dL Final   ??? Total Bilirubin 03/28/2019 0.7  0.0 - 1.2 mg/dL Final   ??? AST 30/86/5784 27  14 - 38 U/L Final   ??? ALT 03/28/2019 9  <35 U/L Final   ??? Alkaline Phosphatase 03/28/2019 80  38 - 126 U/L Final   ??? WBC 03/28/2019 3.3* 4.5 - 11.0 10*9/L Final   ??? RBC 03/28/2019 2.73* 4.00 - 5.20 10*12/L Final   ??? HGB 03/28/2019 10.0* 13.5 - 16.0 g/dL Final   ??? HCT 69/62/9528 32.5* 36.0 - 46.0 % Final   ??? MCV 03/28/2019 118.9* 80.0 - 100.0 fL Final   ??? MCH 03/28/2019 36.7* 26.0 - 34.0 pg Final   ??? MCHC 03/28/2019 30.8* 31.0 - 37.0 g/dL Final   ??? RDW 41/32/4401 19.8* 12.0 - 15.0 % Final   ??? MPV 03/28/2019 8.4  7.0 - 10.0 fL Final   ??? Platelet 03/28/2019 105* 150 - 440 10*9/L Final   ??? Neutrophils % 03/28/2019 59.9  % Final   ??? Lymphocytes % 03/28/2019 21.5  % Final   ??? Monocytes % 03/28/2019 7.0  % Final   ???  Eosinophils % 03/28/2019 8.4  % Final   ??? Basophils % 03/28/2019 0.5  % Final   ??? Absolute Neutrophils 03/28/2019 2.0  2.0 - 7.5 10*9/L Final   ??? Absolute Lymphocytes 03/28/2019 0.7* 1.5 - 5.0 10*9/L Final   ??? Absolute Monocytes 03/28/2019 0.2  0.2 - 0.8 10*9/L Final   ??? Absolute Eosinophils 03/28/2019 0.3  0.0 - 0.4 10*9/L Final   ??? Absolute Basophils 03/28/2019 0.0  0.0 - 0.1 10*9/L Final   ??? Large Unstained Cells 03/28/2019 3  0 - 4 % Final   ??? Macrocytosis 03/28/2019 Marked* Not Present Final   ??? Anisocytosis 03/28/2019 Moderate* Not Present Final   ??? Hypochromasia 03/28/2019 Moderate* Not Present Final   ??? Smear Review Comments 03/28/2019 See Comment* Undefined Final    Smear Reviewed   ??? Acanthocytes 03/28/2019 Present* Not Present Final   ??? Sodium 03/28/2019 142  135 - 145 mmol/L Final   ??? Potassium 03/28/2019 4.0  3.5 - 5.0 mmol/L Final   ??? Chloride 03/28/2019 110* 98 - 107 mmol/L Final   ??? Anion Gap 03/28/2019 7  7 - 15 mmol/L Final   ??? CO2 03/28/2019 25.0  22.0 - 30.0 mmol/L Final   ??? BUN 03/28/2019 21  7 - 21 mg/dL Final   ??? Creatinine 03/28/2019 1.06* 0.60 - 1.00 mg/dL Final   ??? BUN/Creatinine Ratio 03/28/2019 20   Final   ??? EGFR CKD-EPI Non-African American,* 03/28/2019 54* >=60 mL/min/1.58m2 Final   ??? EGFR CKD-EPI African American, Fem* 03/28/2019 63  >=60 mL/min/1.55m2 Final   ??? Glucose 03/28/2019 91  70 - 179 mg/dL Final   ??? Calcium 16/06/9603 9.4  8.5 - 10.2 mg/dL Final   ??? Albumin 54/05/8118 4.2  3.5 - 5.0 g/dL Final ??? Total Protein 03/28/2019 7.2  6.5 - 8.3 g/dL Final   ??? Total Bilirubin 03/28/2019 0.7  0.0 - 1.2 mg/dL Final   ??? AST 14/78/2956 22  14 - 38 U/L Final   ??? ALT 03/28/2019 9  <35 U/L Final   ??? Alkaline Phosphatase 03/28/2019 80  38 - 126 U/L Final   ??? WBC 03/28/2019 3.3* 4.5 - 11.0 10*9/L Final   ??? RBC 03/28/2019 2.75* 4.00 - 5.20 10*12/L Final   ??? HGB 03/28/2019 10.1* 13.5 - 16.0 g/dL Final   ??? HCT 21/30/8657 32.8* 36.0 - 46.0 % Final   ??? MCV 03/28/2019 119.0* 80.0 - 100.0 fL Final   ??? MCH 03/28/2019 36.6* 26.0 - 34.0 pg Final   ??? MCHC 03/28/2019 30.8* 31.0 - 37.0 g/dL Final   ??? RDW 84/69/6295 20.1* 12.0 - 15.0 % Final   ??? MPV 03/28/2019 8.6  7.0 - 10.0 fL Final   ??? Platelet 03/28/2019 107* 150 - 440 10*9/L Final   ??? Neutrophils % 03/28/2019 64.2  % Final   ??? Lymphocytes % 03/28/2019 16.6  % Final   ??? Monocytes % 03/28/2019 7.1  % Final   ??? Eosinophils % 03/28/2019 8.6  % Final   ??? Basophils % 03/28/2019 0.7  % Final   ??? Absolute Neutrophils 03/28/2019 2.1  2.0 - 7.5 10*9/L Final   ??? Absolute Lymphocytes 03/28/2019 0.5* 1.5 - 5.0 10*9/L Final   ??? Absolute Monocytes 03/28/2019 0.2  0.2 - 0.8 10*9/L Final   ??? Absolute Eosinophils 03/28/2019 0.3  0.0 - 0.4 10*9/L Final   ??? Absolute Basophils 03/28/2019 0.0  0.0 - 0.1 10*9/L Final   ??? Large Unstained Cells 03/28/2019 3  0 - 4 % Final   ???  Macrocytosis 03/28/2019 Marked* Not Present Final   ??? Anisocytosis 03/28/2019 Moderate* Not Present Final   ??? Hypochromasia 03/28/2019 Moderate* Not Present Final   ??? PRO-BNP 03/28/2019 6,900.0* 0.0 - 353.0 pg/mL Final        Servando Salina, MD  Breast Medical Oncology   [facility] Hematology/Oncology   [address]   [place], [place] [address]   Fax: [phone]

## 2019-04-26 NOTE — Unmapped (Signed)
Patient called the nurse line and stated,    I just had a message from [place], or somebody from there.  It wasn't another office because they gave me this number.    Advised patient that there are no urgent needs indicated by her care team at this time, and that if I found the reason for the call that I would relay that information to her.  She verbalized understanding and had no further needs.

## 2019-04-27 MED ORDER — CARVEDILOL 25 MG TABLET
ORAL_TABLET | 0 refills | 0 days | Status: CP
Start: 2019-04-27 — End: ?

## 2019-04-28 NOTE — Unmapped (Signed)
I spoke with patient Sonya Williams to confirm appointments on the following date(s): 9.15 and 10.5     Benita L Hueston

## 2019-04-29 MED ORDER — METHIMAZOLE 5 MG TABLET
ORAL_TABLET | 0 refills | 0 days | Status: CP
Start: 2019-04-29 — End: ?

## 2019-04-29 NOTE — Unmapped (Signed)
Counseled Sonya Williams, 68 y.o. for treatment of tobacco use/dependence. Pt had expressed interest in counseling via automated phone program.    SUMMARY: SW assessed pt's current tobacco use and history of use, and used MI to elicit motivation. SW provided evidence-based treatment for tobacco use, including discussing triggers, behavioral strategies, and tobacco cessation medications. Pt reported she smokes under a pack per day, but the exact amount is unclear. Pt said the nicotine patch, which she has used in the past, had not helped, and she did not like the taste of nicotine gum. SW suggested that pt try varenicline and counseled pt on potential side effects and ways to mitigate those. Pt will receive follow-up from SW.    Tobacco Use Treatment  Program: [facility]  Type of Visit: Initial  Session Number: 1  Tobacco Use Treatment Visit: Talked with patient  Permission To Engage In Conversation Re: PHI w/ Visitors Present: n/a  Goals Of Session: Insight, increase, Assessment, Risk behaviors, modulate, Behavior management, improve    Cancer Center Patients  Primary Service: Other(TA)  Primary Cancer Type: Breast    Tobacco Use During Past 30 Days  Time Since Last Tobacco Use: smoked a cigarette today (at least one puff)  Tobacco Withdrawal (Past 24 Hours): None noted  Type of Tobacco Products Used: Cigarettes  Quantity Used: 20  Quantity Per: day    Tobacco Use History  Most Recent Attempt: Past year  Medications Used in Past Attempts: Nicotine Gum, Nicotine Patch  Previously seen by NDP?: [facility]    Behavioral Assessment  Why Uses: (1) Boredom. Pt reports she spends a lot of time home alone  Reasons to Become Tobacco Free: (1) Health  Barriers/Challenges: (1) No barriers identified at this time  Strategies: (1) Faith/spirituality. Pt reports this has helped her with cancer treatment (2) Pt to try varenicline    Treatment Plan  Cessation Meds Currently Using: None  Outpatient/Discharge Medications Recommended: Varenicline  Plan to Obtain Outpatient Meds: TTS messaged providers for Rx(Msg'd Edwyna Ready)  Patient's Plan Post Discharge/Visit: Plan to quit in next 6 months  Follow-up Plan: Permission given, Phone follow-up scheduled  Family Members Included in Intervention/Plan: No    TTS Information  Diagnosis: Tobacco use disorder, unspecified, uncomplicated (F17.200)  Interventions: Assessed, Discussed, Informed, Suggested, Tx plan development, Encouraged  TTS Visit Length: 3-10 minutes    Ignacia Palma, Clare Charon  Tobacco Treatment Specialist  [facility]  [phone]

## 2019-04-29 NOTE — Unmapped (Signed)
Refilled:12/27/2018    Last OV: 03/30/2019    Future OV: 06/06/2019          This is a Dr. Graciela Husbands patient

## 2019-05-01 MED ORDER — VARENICLINE 0.5 MG (11)-1 MG (42) TABLETS IN A DOSE PACK
PACK | 0 refills | 0 days | Status: CP
Start: 2019-05-01 — End: 2019-07-30

## 2019-05-01 MED ORDER — VARENICLINE 1 MG TABLET
ORAL_TABLET | Freq: Two times a day (BID) | ORAL | 1 refills | 30 days | Status: CP
Start: 2019-05-01 — End: 2019-06-30

## 2019-05-03 NOTE — Unmapped (Signed)
[facility] Refill Coordination Note    Specialty Medication(s) to be Shipped:   Hematology/Oncology: Capecitabine 500mg , directions: 2 tablets bid for 14 days off 7     Other medication(s) to be shipped:      Sonya Williams, DOB: 12/07/1950  Phone: [phone] (home)       All above HIPAA information was verified with patient.     Completed refill call assessment today to schedule patient's medication shipment from the [facility] [phone]).       Specialty medication(s) and dose(s) confirmed: Regimen is correct and unchanged.   Changes to medications: [name] reports no changes at this time.  Changes to insurance: No  Questions for the pharmacist: No    Confirmed patient received Welcome Packet with first shipment. The patient will receive a drug information handout for each medication shipped and additional FDA Medication Guides as required.       DISEASE/MEDICATION-SPECIFIC INFORMATION        N/A    SPECIALTY MEDICATION ADHERENCE     Medication Adherence    Patient reported X missed doses in the last month: 0  Specialty Medication: capecitabine 500mg    Patient is on additional specialty medications: No  Informant: patient  Confirmed plan for next specialty medication refill: delivery by pharmacy  Refills needed for supportive medications: not needed          Refill Coordination    Has the Patients' Contact Information Changed: No  Is the Shipping Address Different: Yes  Updated Shipping Address: [address] [place]            CAPECITABINE 500 mg: 4 days of medicine on hand         SHIPPING     Shipping address confirmed in Epic.     Delivery Scheduled: Yes, Expected medication delivery date: 9/10.     Medication will be delivered via UPS to the prescription address in Epic WAM.    Jolene Schimke   [facility] Specialty Technician

## 2019-05-11 MED FILL — CAPECITABINE 500 MG TABLET: 21 days supply | Qty: 56 | Fill #2

## 2019-05-11 MED FILL — CAPECITABINE 500 MG TABLET: 21 days supply | Qty: 56 | Fill #2 | Status: AC

## 2019-05-17 ENCOUNTER — Other Ambulatory Visit: Admit: 2019-05-17 | Discharge: 2019-05-18 | Payer: MEDICARE

## 2019-05-17 DIAGNOSIS — C50412 Malignant neoplasm of upper-outer quadrant of left female breast: Secondary | ICD-10-CM

## 2019-05-17 DIAGNOSIS — Z171 Estrogen receptor negative status [ER-]: Secondary | ICD-10-CM

## 2019-05-17 DIAGNOSIS — Z66 Do not resuscitate: Secondary | ICD-10-CM

## 2019-05-17 LAB — COMPREHENSIVE METABOLIC PANEL
ALBUMIN: 3.9 g/dL (ref 3.5–5.0)
ALKALINE PHOSPHATASE: 91 U/L (ref 38–126)
ALT (SGPT): 7 U/L (ref <35)
ANION GAP: 10 mmol/L (ref 7–15)
AST (SGOT): 20 U/L (ref 14–38)
BILIRUBIN TOTAL: 0.6 mg/dL (ref 0.0–1.2)
BLOOD UREA NITROGEN: 18 mg/dL (ref 7–21)
CALCIUM: 8.9 mg/dL (ref 8.5–10.2)
CHLORIDE: 106 mmol/L (ref 98–107)
CO2: 24 mmol/L (ref 22.0–30.0)
CREATININE: 0.97 mg/dL (ref 0.60–1.00)
EGFR CKD-EPI AA FEMALE: 70 mL/min/{1.73_m2} (ref >=60)
EGFR CKD-EPI NON-AA FEMALE: 61 mL/min/{1.73_m2} (ref >=60)
GLUCOSE RANDOM: 80 mg/dL (ref 70–179)
POTASSIUM: 3.9 mmol/L (ref 3.5–5.0)
PROTEIN TOTAL: 6.9 g/dL (ref 6.5–8.3)

## 2019-05-17 LAB — CBC W/ AUTO DIFF
BASOPHILS ABSOLUTE COUNT: 0 10*9/L (ref 0.0–0.1)
BASOPHILS RELATIVE PERCENT: 0.4 %
EOSINOPHILS ABSOLUTE COUNT: 0.2 10*9/L (ref 0.0–0.4)
EOSINOPHILS RELATIVE PERCENT: 7.3 %
HEMATOCRIT: 29.6 % — ABNORMAL LOW (ref 36.0–46.0)
HEMOGLOBIN: 9.8 g/dL — ABNORMAL LOW (ref 13.5–16.0)
LARGE UNSTAINED CELLS: 2 % (ref 0–4)
LYMPHOCYTES ABSOLUTE COUNT: 0.5 10*9/L — ABNORMAL LOW (ref 1.5–5.0)
LYMPHOCYTES RELATIVE PERCENT: 14.8 %
MEAN CORPUSCULAR HEMOGLOBIN: 37.5 pg — ABNORMAL HIGH (ref 26.0–34.0)
MEAN CORPUSCULAR VOLUME: 112.8 fL — ABNORMAL HIGH (ref 80.0–100.0)
MEAN PLATELET VOLUME: 8.4 fL (ref 7.0–10.0)
MONOCYTES RELATIVE PERCENT: 8.2 %
NEUTROPHILS ABSOLUTE COUNT: 2.2 10*9/L (ref 2.0–7.5)
NEUTROPHILS RELATIVE PERCENT: 67.2 %
PLATELET COUNT: 100 10*9/L — ABNORMAL LOW (ref 150–440)
RED CELL DISTRIBUTION WIDTH: 19.2 % — ABNORMAL HIGH (ref 12.0–15.0)
WBC ADJUSTED: 3.3 10*9/L — ABNORMAL LOW (ref 4.5–11.0)

## 2019-05-17 LAB — ALKALINE PHOSPHATASE: Alkaline phosphatase:CCnc:Pt:Ser/Plas:Qn:: 91

## 2019-05-17 LAB — MONOCYTES ABSOLUTE COUNT: Monocytes:NCnc:Pt:Bld:Qn:Automated count: 0.3

## 2019-05-17 NOTE — Unmapped (Signed)
Labs drawn via [place].    Port flushed and brisk blood return     Pt tolerated well.  Pt stable and ambulated independently from clinic.

## 2019-05-18 NOTE — Unmapped (Signed)
Provided follow-up counseling to Sonya Williams, 68 y.o. for treatment of tobacco use/dependence.     SUMMARY: SW re-assessed pt's tobacco use status and followed up about treatment plan. Pt has made some progress toward goals as evidenced by her report that she had reduced use to just under 1 ppd. Unfortunately, pt reported that she had stopped using varenicline because it made her nauseous. Pt has not followed through on using other smoking cessation medications in the past, so SW suggested that pt focus on behavioral strategies. Pt stated she would continue her strategy of gradual reduction. Pt will receive follow-up from SW.    Tobacco Use Treatment  Program: [facility]  Type of Visit: Follow-up  Session Number: 2  Tobacco Use Treatment Visit: Talked with patient  Permission To Engage In Conversation Re: PHI w/ Visitors Present: n/a  Goals Of Session: Insight, increase, Assessment, Risk behaviors, modulate, Behavior management, improve    Tobacco Use During Past 30 Days  Time Since Last Tobacco Use: smoked a cigarette today (at least one puff)  Tobacco Withdrawal (Past 24 Hours): None noted  Type of Tobacco Products Used: Cigarettes  Quantity Used: 18  Quantity Per: day    Behavioral Assessment  Why Uses: (1) Boredom. Pt reports she spends a lot of time home alone  Reasons to Become Tobacco Free: (1) Health  Barriers/Challenges: (1) Pt spends a lot of time home alone. She feels unwell and therefore struggles to engage in distracting activities (2) Pt reports that varenicline made her nauseous  Strategies: (1) Faith/spirituality. Pt reports this has helped her with cancer treatment (2) Pt enjoys spending time with her dog (3) Pt to implement a strategy of gradual reduction    Treatment Plan  Cessation Meds Currently Using: None  Outpatient/Discharge Medications Recommended: Patient declines  Patient's Plan Post Discharge/Visit: Plan to quit in next 6 months  Follow-up Plan: Permission given, Phone follow-up scheduled  Family Members Included in Intervention/Plan: No    TTS Information  Diagnosis: Tobacco use disorder, unspecified, uncomplicated (F17.200)  Interventions: Assessed, Discussed, Behavioral techniques, Suggested, Tx plan development, Encouraged  TTS Visit Length: 3-10 minutes    Ignacia Palma, LCSW, LCAS  Tobacco Treatment Specialist  [facility]  [phone]

## 2019-05-24 MED FILL — FUROSEMIDE 40 MG TABLET: 30 days supply | Qty: 60 | Fill #1 | Status: AC

## 2019-05-24 MED FILL — CLONAZEPAM 1 MG TABLET: 30 days supply | Qty: 60 | Fill #3 | Status: AC

## 2019-05-24 MED FILL — FUROSEMIDE 40 MG TABLET: ORAL | 30 days supply | Qty: 60 | Fill #1

## 2019-05-24 MED FILL — CLONAZEPAM 1 MG TABLET: ORAL | 30 days supply | Qty: 60 | Fill #3

## 2019-05-24 NOTE — Unmapped (Signed)
Name:  Sonya Williams  DOB: [birth year]  Today's Date: 05/24/2019  Age:  68 y.o.    Assessment/Plan:      1. Medicare annual wellness visit, subsequent    2. Falls frequently    3. Essential hypertension    4. Graves disease    5. Osteoporosis, unspecified osteoporosis type, unspecified pathological fracture presence    6. Malignant neoplasm of left female breast, unspecified estrogen receptor status, unspecified site of breast (CMS-HCC)    7. Pure hypercholesterolemia (RAF-HCC)    8. Chronic heart failure with preserved ejection fraction (CMS-HCC)    9. 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 Focal epilepsy with impairment of consciousness, intractable (CMS-HCC)    11. Anemia in neoplastic disease    12. Tobacco use      Patient instructions:   A referral to [facility] in [place] has been made to help you with your lower body strength and balance to decrease your risk of falls.     Good work cutting back on cigarettes see if you can give up those last few.     An order for a bone density test in [place] has been made to check on your Osteoporosis.     Continue your present medicines.     Finish filling out [employer] power of attorney and living will forms you started and after taking them to the bank to have your signature notarized bring in a copy of the forms for [place] to place in your record.     Get shingles vaccine at your pharmacy.     Follow up in 6 months.     Personalized Prevention Plan: A personalized prevention plan was reviewed with the patient and a written copy was provided for personal records (available for review in Patient Instructions).    Risks identified:     -- Fall risk:  Fall risk identified.  Patient referred to physical therapy  -- Tobacco use:  has 0-1 cigarettes daily    The following preventive services were discussed with the patient:     -- See MD Note     Barriers to goals identified and addressed. Pertinent handouts were given today and reviewed with the patient as indicated.  The Care Plan and Self-Management goals have either been printed for the patient in the AVS, or sent to the patient???s MyChart as per the patient???s preference. Any outside resources or referrals needed at this time are noted above. Patient's current medications have been reviewed.  Patient voiced understanding and all questions have been answered to satisfaction.       Subjective/Objective:      Patient ID: Sonya Williams is a 68 y.o. female who presents for an Annual Wellness Visit .    She continues to follow with oncology regarding her breast cancer.  Her anemia is followed by them.  She is tolerating her therapy.     She follows with neurology for her seizure disorder.     She saw cardiology 05/2018 re: CAD w/ CHF. She has not had recent CP or SOB. She continues on ASA, Statin, ARB and CCB.    Re: her HTN she has been taking medicine regularly and has noted good BPs at home.      The patient came to the appointment alone    Medications, allergies, medical history, surgical history, family history, social history, and previous hospitalization history were reviewed and updated in the EMR today.  Vitals and BMI were reviewed.     Preventative health screening tests and immunization records were reviewed.  Appropriate age related screening tests were ordered today if patient is due and agreeable to have tests completed.  See assessment/plan.    Current Providers:     Patient Care Team:  Felicity Coyer, MD as PCP - General (Family Medicine)  Felicity Coyer, MD as PCP - Rande Brunt  Megan Salon Carron Curie as Nurse Practitioner (Neurology)  Fayne Norrie Muss, MD as Attending Provider (Oncology)    Health Risk Assessment (HRA):     Balance and gait assessment:  Timed Get up and Go to perform walk a distance of 10 ft.: 10 seconds. This falls in the Abnormal range. Results discussed with patient. If abnormal, provider notified.      Normative Reference Values by Age   Time in Seconds    72 ??? 69 years  8.1 (7.1 ??? 9.0)    70 ??? 79 years  9.2 (8.2 ??? 10.2)    80 ??? 99 years  11.3 (10.0 ??? 12.7)      Cognitive Assessment:    6 Cognitive Impairment Test    Cognitive Assessment:  1.  What year is it?     0 points - Correct  2.  What month is it?     0 points - Correct  3.  Give the patient an address phrase to remember with 5 components,  (eg Floyde Parkins, [address], [place])  4.  About what time is it (within 1 hour)  0 points - Correct  5.  Count backwards from 20-1   0 points - Correct  6.  Say the months of the year in reverse  0 points - Correct  7.  Repeat address phrase    4 points - 2 Errors          Total Score: 4  Interpretation: 0-7, Normal    Health Literacy: How confident are you that you understand your health issues/concerns, can participate in your care, and manage your care along with your physician: confident.    Safety & Fall Risk:    1) Any falls in the past year? Yes  enter inFalls Risk flowsheet      2) Any use of assistive devices to ambulate? No    Current Suppliers (DME):    [employer]    Functional ability evaluation:    1) Does patient need assistance with ADLs, managing finances, remembering to take medications, using the phone, shopping for food, making meals, and performing housework? No    Educated patient on the importance of carrying a medication list with them at all times.    2) Is patient is incontinent of urine or stool? No    3) Does the patient still drive? Yes  If yes, are you having difficulties driving?  No.    Does the patient fasten their seat belt?Yes.    Hearing loss screen:      1) Does pt wear hearing aids? No    2)Does patient have to strain or struggle to hear/understand conversations? No    Exercise and Nutrition:     1) Has patient had any unplanned weight loss in the last 3 months? No    2) Does the patient exercise and if yes, how often? no    3) Does patient have a pressure ulcer or non-healing wound? No    4) Would patient like to be referred to nutrition if  they have a calculated body mass index < 18.5 or > 30? No    Estimated body mass index is 20.13 kg/m?? as calculated from the following:    Height as of 03/30/19: 157 cm (5' 1.81).    Weight as of 04/06/19: 49.6 kg (109 lb 6.4 oz).  No height and weight on file for this encounter.    End of life planning:     Discussed a Living Will with the patient and the: they have a Healthcare power of attorney Living Will but have not brought a copy to the office.  Have discussed the importance of having this included in the medical record.     Psychosocial risks:    Jovita Kussmaul    Social history updated and reviewed.    Past history of Psychiatric illness: No    PHQ-9:    Patient completed a PHQ-9, with a score of PHQ-9 TOTAL SCORE: 3.  Discussed and reviewed results with the patient.     She is here for follow up of hypertension.    She does not have side effects to the medication.   Blood pressures at home range between 136 and 78.    She is not exercising and is adherent to a low-salt diet.   She has on average 4 servings of fruits and vegetables daily.  Alcohol intake is 0 drinks per week.    She does have pain in the calves on walking.  She does not have regular headaches.  She does not have shortness of breath.  She does not have chest pain  She does have swelling in the legs.  She does not have cough.  She does have dizziness    ROS:  Constitutional: Denies excessive fatigue, fever, chills, sweats, or lightheadedness.  Eyes: Denies blurred or changing vision, eye pain, or irritation.  ENT: Denies ear pain, changes in hearing, nasal/sinus problems, sneezing, or sore throat.  Respiratory: Denies cough, SOB, wheezing, or DOE.  Cardiovascular: + Ankle Edema  Gastrointestinal: Denies nausea/vomiting, constipation, abdominal pain, bloody stool, diarrhea, or reflux.  Breast: Denies breast pain, breast lumps, nipple discharge, or other breast changes.  Genitourinary:Denies urinary frequency, dysuria, urgency, hematuria, incontinence, vaginal discharge, changes in urination,  vaginal irritation/itching, irregular or painful menses (IF MENSTRATRING), dysparunia  Musculoskeletal: Denies new pain or swelling in joints, or new or unusual muscle pain  Skin/Nails/Hair: Denies rash, itching, hair loss, new/changing lesions, or slow-healing wounds.  Neurological: Balance Problems  Mental Health: PHQ 2 negative  Endocrine: Denies excessive urination or thirst, unexpected weight change, or heat/cold intolerance.  Hematologic: Denies unusual bleeding/bruising or lymphadenopathy.  Allergy: Denies seasonal symptoms, sneezing, itchy/watery eyes, hives, or lip swelling.    Physical Exam:   VITAL SIGNS: BP 110/60  - Pulse 61  - Temp 35.9 ??C (96.7 ??F) (Temporal)  - Resp 12  - Ht 157 cm (5' 1.81)  - Wt 53.3 kg (117 lb 6.4 oz)  - SpO2 96%  - BMI 21.60 kg/m??   Wt Readings from Last 6 Encounters:   06/06/19 53.6 kg (118 lb 3.2 oz)   06/06/19 53.3 kg (117 lb 6.4 oz)   04/06/19 49.6 kg (109 lb 6.4 oz)   03/30/19 52.6 kg (116 lb)   03/28/19 52.8 kg (116 lb 4.8 oz)   03/07/19 50.9 kg (112 lb 4.8 oz)     GENERAL: Well appearing, alert, in NAD  HEAD: Normocephalic and atraumatic  EYES: No exophthalmos. EOMI. Lids- No crusting. Conjunctiva- clear.  Sclera- anicteric. PER  EARS: Auricles normal.  EACs, TMs normal. Hearing grossly intact   NECK: Supple w/o adenopathy  or masses. Smooth enlargement of the thyroid.   LYMPHATIC:  No enlarged cervical or supraclavicular lymph nodes.  CHEST:  No chest wall deformity or tenderness.  Respirations are unlabored.  Auscultation bilaterally clear.   HEART: RRR w/o murmurs  VASCULAR: No carotid, abdominal, or femoral bruits, pedal pulses intact, no edema  ABDOMEN: Soft, non-distended, no organomegaly or masses, non-tender  GENITOURINARY: No CVAT, suprapubic tenderness.   BACK/SPINE: No kyphosis, scoliosis or tenderness.  MUSCULOSKELETAL: No joint or muscle tenderness, deformity or swelling. ROM is normal  INTEGUMENT: Dry skin on arms R>L and torso but much better than in the past.   NEUROLOGIC:  Alert. Cr N II-XII intact. Normal speech  PSYCHOLOGIC: No evidence of anxiety or depression, normal mood, appropriate affect    Results for orders placed or performed in visit on 06/06/19   TSH   Result Value Ref Range    TSH 90.500 (H) 0.600 - 3.300 uIU/mL   T4, Free   Result Value Ref Range    Free T4 0.14 (L) 0.71 - 1.40 ng/dL

## 2019-05-24 NOTE — Unmapped (Addendum)
A referral to [facility] in [place] has been made to help you with your lower body strength and balance to decrease your risk of falls.     Good work cutting back on cigarettes see if you can give up those last few.     An order for a bone density test in [place] has been made to check on your Osteoporosis.     Continue your present medicines.     Finish filling out [employer] power of attorney and living will forms you started and after taking them to the bank to have your signature notarized bring in a copy of the forms for [place] to place in your record.     Get shingles vaccine at your pharmacy.     Follow up in 6 months.   Name: Sonya Williams  DOB: [birth year]  Today's Date: 05/24/2019  Age: 68 y.o.    MEDICARE SCREENING & PREVENTION GUIDELINES RECOMMENDATIONS DATE UP-TO-DATE OR   TO BE REASSESSED   AAA Ultrasound Once in men age 78 to 42 who have ever smoked or currently smoke. Abdominal ultrasound date: Not Found not applicable    Colorectal Cancer Screening Patients 50 to 75: stool cards annually OR colonoscopy every 10 years (or more frequently if high risk) OR FIT-DNA every 3 years.  Colonoscopy date: 03/02/2018  FOBT date: Not Found up to date    DEXA Bone Density Measurement Patients age 37-85 to have a DEXA every 5 years in postmenopausal women, males will defer to PCP. DXA date: 02/27/2017 up to date    Diabetes Eye Exam  Annually if Diabetic, or every 2 years if prior exam did not show retinopathy in both eyes. Eye Exam date: Not Found not applicable    Diabetes Foot Exam  Annually if Diabetic. Most Recent Foot Exam Date: Not Found not applicable    Diabetes Screening (fasting glucose) Annually for patients age 41-70. If risk factors (family hx of DM, hx of gestational DM, and/or PCOS), or diagnosed with pre-diabetes, twice per year. Normal fasting glucose range: 65-99. If diagnosed with diabetes, not applicable. Lab Results   Component Value Date    GLU 80 05/17/2019     Lab Results Component Value Date    GLUF 96 11/08/2014     No results found for: FBSFP  Lab Results   Component Value Date    POCGLU 97 03/17/2018    up to date    Heart Disease Screening (fasting lipid panel) Minimum of every 5 years, patients age 37-75,  if no apparent signs or symptoms of heart disease. If diagnosed with hyperlipidemia, not applicable. Lab Results   Component Value Date/Time    CHOL 178 09/29/2016 10:48 AM    CHOL 169 03/30/2013 04:28 PM    LDL 100 09/29/2016 10:48 AM    LDL 87 03/30/2013 04:28 PM    up to date    Mammogram Screening (women) Age 38-74 every 2 years.  Mammogram date: 04/06/2019 up to date    Pelvic Exam & Pap Smear  (women) Women ages 69 to 71 every 3 years with negative cytology (pap smear)  OR,   women ages 85 to 1 every 5 years if they have had both a negative pap and human papillomavirus (HPV) OR,  Every 3 years if they had a positive HPV result. Pap Smear date: Not Found    @LASTHPVDT @ not applicable    Hepatitis C Screening  A one-time screening for HCV infection for adults  born between [birth year] & 1965. Lab Results   Component Value Date/Time    HEPCAB Nonreactive 09/17/2017 09:48 AM    complete    Tdap/Td Tdap should be a one-time dose for adults and Td is given every 10 years.  12/05/09 up to date    Influenza Vaccine Annually   due    Prevnar 13 Prevnar given at age 44 and Pneumovax given one year later. These vaccines may be given in a different sequence depending on chronic conditions. (utilize BPA for dosing & administration) 11/15/13 complete    Pneumovax 23 Prevnar given at age 56 and Pneumovax given one year later. These vaccines may be given in a different sequence depending on chronic conditions. (utilize BPA for dosing & administration) 09/25/16 complete    Zostavax Vaccine Once at age 34 or older (may not be covered by Medicare) 08/22/11 complete    Shingrix Vaccine 2-dose series, beginning at age 74. The 2nd dose is administered 2-6 months after the first.   due Immunization History   Administered Date(s) Administered   ??? INFLUENZA TIV (TRI) 63MO+ W/ PRESERV (IM) 05/12/2017   ??? INFLUENZA TIV (TRI) PF (IM) 06/30/2012   ??? Influenza Vaccine Quad (IIV4 PF) 23mo+ injectable 05/19/2013, 09/04/2014, 09/26/2015, 05/12/2017, 05/10/2018   ??? Influenza Virus Vaccine, unspecified formulation 09/25/2016   ??? PNEUMOCOCCAL POLYSACCHARIDE 23 09/26/2015   ??? PPD Test 12/14/2017   ??? Pneumococcal Conjugate 13-Valent 11/15/2013   ??? TdaP 12/05/2009   ??? ZOSTAVAX - ZOSTER VACCINE, LIVE, SQ 08/22/2011           Patient Education        Well Visit, Over 65: Care Instructions  Your Care Instructions     Physical exams can help you stay healthy. Your doctor has checked your overall health and may have suggested ways to take good care of yourself. He or she also may have recommended tests. At home, you can help prevent illness with healthy eating, regular exercise, and other steps.  Follow-up care is a key part of your treatment and safety. Be sure to make and go to all appointments, and call your doctor if you are having problems. It's also a good idea to know your test results and keep a list of the medicines you take.  How can you care for yourself at home?  ?? Reach and stay at a healthy weight. This will lower your risk for many problems, such as obesity, diabetes, heart disease, and high blood pressure.  ?? Get at least 30 minutes of exercise on most days of the week. Walking is a good choice. You also may want to do other activities, such as running, swimming, cycling, or playing tennis or team sports.  ?? Do not smoke. Smoking can make health problems worse. If you need help quitting, talk to your doctor about stop-smoking programs and medicines. These can increase your chances of quitting for good.  ?? Protect your skin from too much sun. When you're outdoors from 10 a.m. to 4 p.m., stay in the shade or cover up with clothing and a hat with a wide brim. Wear sunglasses that block UV rays. Even when it's cloudy, put broad-spectrum sunscreen (SPF 30 or higher) on any exposed skin.  ?? See a dentist one or two times a year for checkups and to have your teeth cleaned.  ?? Wear a seat belt in the car.  Follow your doctor's advice about when to have certain tests. These tests can spot problems early.  For men and women  ?? Cholesterol. Your doctor will tell you how often to have this done based on your overall health and other things that can increase your risk for heart attack and stroke.  ?? Blood pressure. Have your blood pressure checked during a routine doctor visit. Your doctor will tell you how often to check your blood pressure based on your age, your blood pressure results, and other factors.  ?? Diabetes. Ask your doctor whether you should have tests for diabetes.  ?? Vision. Experts recommend that you have yearly exams for glaucoma and other age-related eye problems.  ?? Hearing. Tell your doctor if you notice any change in your hearing. You can have tests to find out how well you hear.  ?? Colon cancer tests. Keep having colon cancer tests as your doctor recommends. You can have one of several types of tests.  ?? Heart attack and stroke risk. At least every 4 to 6 years, you should have your risk for heart attack and stroke assessed. Your doctor uses factors such as your age, blood pressure, cholesterol, and whether you smoke or have diabetes to show what your risk for a heart attack or stroke is over the next 10 years.  ?? Osteoporosis. Talk to your doctor about whether you should have a bone density test to find out whether you have thinning bones. Ask your doctor if you need to take a calcium plus vitamin D supplement. You may be able to get enough calcium and vitamin D through your diet.  For women  ?? Pap test and pelvic exam. You may no longer need a Pap test. Talk with your doctor about whether to stop or continue to have Pap tests.  ?? Breast exam and mammogram. Ask how often you should have a mammogram, which is an X-ray of your breasts. A mammogram can spot breast cancer before it can be felt and when it is easiest to treat.  ?? Thyroid disease. Talk to your doctor about whether to have your thyroid checked as part of a regular physical exam. Women have an increased chance of a thyroid problem.  For men  ?? Prostate exam. Talk to your doctor about whether you should have a blood test (called a PSA test) for prostate cancer. Experts recommend that you discuss the benefits and risks of the test with your doctor before you decide whether to have this test. Some experts say that men ages 6 and older no longer need testing.  ?? Abdominal aortic aneurysm. Ask your doctor whether you should have a test to check for an aneurysm. You may need a test if you ever smoked or if your parent, brother, sister, or child has had an aneurysm.  When should you call for help?  Watch closely for changes in your health, and be sure to contact your doctor if you have any problems or symptoms that concern you.  Where can you learn more?  Go to [facility] at [email or web address]  Select Patient Education under [employer]. Enter [phone] in the search box to learn more about Well Visit, Over 65: Care Instructions.  Current as of: Jan 26, 2019??????????????????????????????Content Version: 12.6  ?? 2006-2020 Healthwise, Incorporated.   Care instructions adapted under license by [facility]. If you have questions about a medical condition or this instruction, always ask your healthcare professional. Healthwise, Incorporated disclaims any warranty or liability for your use of this information.

## 2019-05-25 NOTE — Unmapped (Signed)
[facility] Clinical Assessment & Refill Coordination Note    Sonya Williams, DOB: 06-13-1951  Phone: [phone] (home)     All above HIPAA information was verified with patient.     Specialty Medication(s):   Hematology/Oncology: Capecitabine 500mg , directions: 1000mg  twice daily for 14 days on and 7 fdays off     Current Outpatient Medications   Medication Sig Dispense Refill   ??? acetaminophen (TYLENOL) 500 MG tablet Take 500 mg by mouth every four (4) hours as needed for pain.     ??? amLODIPine (NORVASC) 10 MG tablet Take 1 tablet (10 mg total) by mouth daily. 90 tablet 1   ??? aspirin 81 MG chewable tablet Chew 81 mg once daily.      ??? atorvastatin (LIPITOR) 40 MG tablet TAKE 1 TABLET EVERY DAY 90 tablet 3   ??? betamethasone dipropionate (DIPROLENE) 0.05 % ointment Apply to rash twice a day 90 g 3   ??? capecitabine (XELODA) 500 MG tablet Take 2 tablets (1,000mg ) by mouth twice daily for 14 days, then HOLD for 7 days. 56 tablet 3   ??? carvediloL (COREG) 25 MG tablet TAKE 1 TABLET TWICE DAILY 180 tablet 0   ??? clonazePAM (KLONOPIN) 1 MG tablet Take 1 tablet (1 mg total) by mouth two (2) times a day. 60 tablet 5   ??? fluconazole (DIFLUCAN) 200 MG tablet Take 2 tablets (400 mg total) by mouth once. for 1 dose 2 tablet 0   ??? fluconazole (DIFLUCAN) 200 MG tablet Take 2 tablets (400mg ) by mouth today and then Take 1 tablet (200 mg total) by mouth daily. for 13 days 15 tablet 0   ??? furosemide (LASIX) 40 MG tablet Take 2 tablets (80 mg total) by mouth daily. 60 tablet 5   ??? gabapentin (NEURONTIN) 300 MG capsule TAKE 1 CAPSULE BY MOUTH  three TIMES DAILY 270 capsule 3   ??? levETIRAcetam (KEPPRA) 500 MG tablet Take 2 tablets by mouth in the morning and 3 tablets at night. (Patient taking differently: Take 1.5 tablets by mouth in the morning and 1.5 tablets at night.) 450 tablet 4   ??? loperamide HCl (IMODIUM A-D ORAL) Take 6 tablets by mouth daily as needed.      ??? losartan (COZAAR) 100 MG tablet Take 1 tablet (100 mg total) by mouth daily. 90 tablet 3   ??? meTHIMazole (TAPAZOLE) 5 MG tablet TAKE 1 TABLET TWICE DAILY 180 tablet 0   ??? mometasone (ELOCON) 0.1 % cream Apply to left chest twice daily during radiation treatment 45 g 1   ??? potassium chloride (KLOR-CON) 10 MEQ CR tablet Take 1 tablet (10 mEq total) by mouth daily. Only take this if you are taking the furosemide (lasix) 30 tablet 11   ??? prochlorperazine (COMPAZINE) 10 MG tablet Take 10 mg by mouth every six (6) hours as needed for nausea.     ??? silver sulfaDIAZINE (SILVADENE, SSD) 1 % cream Apply to affected area daily (Patient not taking: Reported on 03/28/2019) 50 g 1   ??? varenicline (CHANTIX PAK) 0.5 mg (11)- 1 mg (42) tablet Take one 0.5mg  tab once daily for 3 days,then increase to one 0.5mg  tab twice daily for 4 days,then increase to one 1mg  tab twice daily. 1 Package 0   ??? varenicline (CHANTIX) 1 mg tablet Take 1 tablet (1 mg total) by mouth Two (2) times a day. 60 tablet 1     No current facility-administered medications for this visit.  Changes to medications: Farra reports no changes at this time.    Allergies   Allergen Reactions   ??? Iodinated Contrast Media Hives and Other (See Comments)     Rash    ??? Other Hives     Metal-reaction=rash       Changes to allergies: No    SPECIALTY MEDICATION ADHERENCE     Capecitabine 500 mg: 3 days of medicine on hand        Medication Adherence    Patient reported X missed doses in the last month: 0  Specialty Medication: Capecitabine 500mg           Specialty medication(s) dose(s) confirmed: Regimen is correct and unchanged.     Are there any concerns with adherence? No    Adherence counseling provided? Not needed    CLINICAL MANAGEMENT AND INTERVENTION      Clinical Benefit Assessment:    Do you feel the medicine is effective or helping your condition? Yes    Clinical Benefit counseling provided? Not needed    Adverse Effects Assessment:    Are you experiencing any side effects? No    Are you experiencing difficulty administering your medicine? No    Quality of Life Assessment:    How many days over the past month did your condition/medication  keep you from your normal activities? For example, brushing your teeth or getting up in the morning. 0    Have you discussed this with your provider? Not needed    Therapy Appropriateness:    Is therapy appropriate? Yes, therapy is appropriate and should be continued    DISEASE/MEDICATION-SPECIFIC INFORMATION      N/A    PATIENT SPECIFIC NEEDS     ? Does the patient have any physical, cognitive, or cultural barriers? No    ? Is the patient high risk? No     ? Does the patient require a Care Management Plan? No     ? Does the patient require physician intervention or other additional services (i.e. nutrition, smoking cessation, social work)? No      SHIPPING     Specialty Medication(s) to be Shipped:   Hematology/Oncology: Capecitabine 500mg , directions: 1000mg  twice daily for 14 days on and 7 days off    Other medication(s) to be shipped: n/a     Changes to insurance: No    Delivery Scheduled: Yes, Expected medication delivery date: 06/01/19.     Medication will be delivered via Next Day Courier to the confirmed home address in [facility].    The patient will receive a drug information handout for each medication shipped and additional FDA Medication Guides as required.  Verified that patient has previously received a Conservation officer, historic buildings.    All of the patient's questions and concerns have been addressed.    Rollen Sox   [facility] Specialty Pharmacist

## 2019-05-31 MED FILL — CAPECITABINE 500 MG TABLET: 21 days supply | Qty: 56 | Fill #3 | Status: AC

## 2019-05-31 MED FILL — CAPECITABINE 500 MG TABLET: 21 days supply | Qty: 56 | Fill #3

## 2019-05-31 NOTE — Unmapped (Signed)
Refilled: 06/24/18    Last OV: 03/30/2019    Future OV: 06/06/2019      On file at [facility] pharmacy

## 2019-06-02 MED ORDER — LOSARTAN 100 MG TABLET
ORAL_TABLET | Freq: Every day | ORAL | 3 refills | 90.00000 days | Status: CP
Start: 2019-06-02 — End: ?

## 2019-06-02 NOTE — Unmapped (Signed)
Refilled: 06/02/19    Last OV: 03/30/2019    Future OV: 06/06/2019

## 2019-06-05 DIAGNOSIS — C50912 Malignant neoplasm of unspecified site of left female breast: Secondary | ICD-10-CM

## 2019-06-06 ENCOUNTER — Institutional Professional Consult (permissible substitution): Admit: 2019-06-06 | Discharge: 2019-06-06 | Payer: MEDICARE

## 2019-06-06 ENCOUNTER — Ambulatory Visit: Admit: 2019-06-06 | Discharge: 2019-06-06 | Payer: MEDICARE | Attending: Family Medicine | Primary: Family Medicine

## 2019-06-06 ENCOUNTER — Ambulatory Visit: Admit: 2019-06-06 | Discharge: 2019-06-06 | Payer: MEDICARE

## 2019-06-06 ENCOUNTER — Ambulatory Visit: Admit: 2019-06-06 | Discharge: 2019-06-06 | Payer: MEDICARE | Attending: Family | Primary: Family

## 2019-06-06 DIAGNOSIS — M810 Age-related osteoporosis without current pathological fracture: Secondary | ICD-10-CM

## 2019-06-06 DIAGNOSIS — I5032 Chronic diastolic (congestive) heart failure: Secondary | ICD-10-CM

## 2019-06-06 DIAGNOSIS — C50912 Malignant neoplasm of unspecified site of left female breast: Secondary | ICD-10-CM

## 2019-06-06 DIAGNOSIS — Z9221 Personal history of antineoplastic chemotherapy: Secondary | ICD-10-CM

## 2019-06-06 DIAGNOSIS — E0500 Thyrotoxicosis with diffuse goiter without thyrotoxic crisis or storm: Secondary | ICD-10-CM

## 2019-06-06 DIAGNOSIS — I110 Hypertensive heart disease with heart failure: Secondary | ICD-10-CM

## 2019-06-06 DIAGNOSIS — R4189 Other symptoms and signs involving cognitive functions and awareness: Secondary | ICD-10-CM

## 2019-06-06 DIAGNOSIS — Z7982 Long term (current) use of aspirin: Secondary | ICD-10-CM

## 2019-06-06 DIAGNOSIS — D630 Anemia in neoplastic disease: Secondary | ICD-10-CM

## 2019-06-06 DIAGNOSIS — Z171 Estrogen receptor negative status [ER-]: Secondary | ICD-10-CM

## 2019-06-06 DIAGNOSIS — M8440XA Pathological fracture, unspecified site, initial encounter for fracture: Secondary | ICD-10-CM

## 2019-06-06 DIAGNOSIS — R296 Repeated falls: Secondary | ICD-10-CM

## 2019-06-06 DIAGNOSIS — Z0000 Encounter for general adult medical examination without abnormal findings: Secondary | ICD-10-CM

## 2019-06-06 DIAGNOSIS — Z91041 Radiographic dye allergy status: Secondary | ICD-10-CM

## 2019-06-06 DIAGNOSIS — C50412 Malignant neoplasm of upper-outer quadrant of left female breast: Secondary | ICD-10-CM

## 2019-06-06 DIAGNOSIS — Z923 Personal history of irradiation: Secondary | ICD-10-CM

## 2019-06-06 DIAGNOSIS — I509 Heart failure, unspecified: Secondary | ICD-10-CM

## 2019-06-06 DIAGNOSIS — Z79899 Other long term (current) drug therapy: Secondary | ICD-10-CM

## 2019-06-06 DIAGNOSIS — R197 Diarrhea, unspecified: Secondary | ICD-10-CM

## 2019-06-06 DIAGNOSIS — G40119 Localization-related (focal) (partial) symptomatic epilepsy and epileptic syndromes with simple partial seizures, intractable, without status epilepticus: Secondary | ICD-10-CM

## 2019-06-06 DIAGNOSIS — E7800 Pure hypercholesterolemia, unspecified: Secondary | ICD-10-CM

## 2019-06-06 DIAGNOSIS — I10 Essential (primary) hypertension: Secondary | ICD-10-CM

## 2019-06-06 DIAGNOSIS — Z720 Tobacco use: Secondary | ICD-10-CM

## 2019-06-06 DIAGNOSIS — I2510 Atherosclerotic heart disease of native coronary artery without angina pectoris: Secondary | ICD-10-CM

## 2019-06-06 LAB — COMPREHENSIVE METABOLIC PANEL
ALBUMIN: 4.2 g/dL (ref 3.5–5.0)
ALKALINE PHOSPHATASE: 97 U/L (ref 38–126)
ALT (SGPT): 8 U/L (ref <35)
ANION GAP: 11 mmol/L (ref 7–15)
AST (SGOT): 21 U/L (ref 14–38)
BILIRUBIN TOTAL: 0.7 mg/dL (ref 0.0–1.2)
BLOOD UREA NITROGEN: 22 mg/dL — ABNORMAL HIGH (ref 7–21)
BUN / CREAT RATIO: 20
CALCIUM: 8.8 mg/dL (ref 8.5–10.2)
CHLORIDE: 100 mmol/L (ref 98–107)
CO2: 29 mmol/L (ref 22.0–30.0)
CREATININE: 1.1 mg/dL — ABNORMAL HIGH (ref 0.60–1.00)
GLUCOSE RANDOM: 87 mg/dL (ref 70–179)
POTASSIUM: 3.1 mmol/L — ABNORMAL LOW (ref 3.5–5.0)
PROTEIN TOTAL: 7.5 g/dL (ref 6.5–8.3)
SODIUM: 140 mmol/L (ref 135–145)

## 2019-06-06 LAB — CBC W/ AUTO DIFF
BASOPHILS ABSOLUTE COUNT: 0 10*9/L (ref 0.0–0.1)
BASOPHILS RELATIVE PERCENT: 0.7 %
EOSINOPHILS RELATIVE PERCENT: 7.1 %
HEMATOCRIT: 31.4 % — ABNORMAL LOW (ref 36.0–46.0)
HEMOGLOBIN: 10.5 g/dL — ABNORMAL LOW (ref 13.5–16.0)
LARGE UNSTAINED CELLS: 4 % (ref 0–4)
LYMPHOCYTES ABSOLUTE COUNT: 0.6 10*9/L — ABNORMAL LOW (ref 1.5–5.0)
LYMPHOCYTES RELATIVE PERCENT: 16.9 %
MEAN CORPUSCULAR HEMOGLOBIN CONC: 33.6 g/dL (ref 31.0–37.0)
MEAN CORPUSCULAR HEMOGLOBIN: 38.1 pg — ABNORMAL HIGH (ref 26.0–34.0)
MEAN CORPUSCULAR VOLUME: 113.5 fL — ABNORMAL HIGH (ref 80.0–100.0)
MEAN PLATELET VOLUME: 7.3 fL (ref 7.0–10.0)
MONOCYTES RELATIVE PERCENT: 8.5 %
NEUTROPHILS ABSOLUTE COUNT: 2.1 10*9/L (ref 2.0–7.5)
NEUTROPHILS RELATIVE PERCENT: 63.1 %
PLATELET COUNT: 121 10*9/L — ABNORMAL LOW (ref 150–440)
RED BLOOD CELL COUNT: 2.77 10*12/L — ABNORMAL LOW (ref 4.00–5.20)
RED CELL DISTRIBUTION WIDTH: 19.5 % — ABNORMAL HIGH (ref 12.0–15.0)
WBC ADJUSTED: 3.4 10*9/L — ABNORMAL LOW (ref 4.5–11.0)

## 2019-06-06 LAB — THYROID STIMULATING HORMONE: Thyrotropin:ACnc:Pt:Ser/Plas:Qn:: 90.5 — ABNORMAL HIGH

## 2019-06-06 LAB — BILIRUBIN TOTAL: Bilirubin:MCnc:Pt:Ser/Plas:Qn:: 0.7

## 2019-06-06 LAB — FREE T4: Thyroxine.free:MCnc:Pt:Ser/Plas:Qn:: 0.14 — ABNORMAL LOW

## 2019-06-06 LAB — RED CELL DISTRIBUTION WIDTH: Lab: 19.5 — ABNORMAL HIGH

## 2019-06-06 MED ORDER — CARVEDILOL 25 MG TABLET
ORAL_TABLET | Freq: Two times a day (BID) | ORAL | 3 refills | 90 days | Status: CP
Start: 2019-06-06 — End: ?

## 2019-06-06 MED ORDER — METHIMAZOLE 5 MG TABLET
ORAL_TABLET | Freq: Two times a day (BID) | ORAL | 3 refills | 90.00000 days | Status: CP
Start: 2019-06-06 — End: ?

## 2019-06-06 MED ORDER — AMLODIPINE 10 MG TABLET
ORAL_TABLET | Freq: Every day | ORAL | 3 refills | 90.00000 days | Status: CP
Start: 2019-06-06 — End: ?
  Filled 2019-06-06: qty 90, 90d supply, fill #0

## 2019-06-06 MED FILL — AMLODIPINE 10 MG TABLET: 90 days supply | Qty: 90 | Fill #0 | Status: AC

## 2019-06-06 NOTE — Unmapped (Signed)
It was a pleasure seeing you today.   Continue with the current plan.  Follow-up in 1 month.     Labs from today if done:  Lab on 05/17/2019   Component Date Value Ref Range Status   ??? Sodium 05/17/2019 140  135 - 145 mmol/L Final   ??? Potassium 05/17/2019 3.9  3.5 - 5.0 mmol/L Final   ??? Chloride 05/17/2019 106  98 - 107 mmol/L Final   ??? Anion Gap 05/17/2019 10  7 - 15 mmol/L Final   ??? CO2 05/17/2019 24.0  22.0 - 30.0 mmol/L Final   ??? BUN 05/17/2019 18  7 - 21 mg/dL Final   ??? Creatinine 05/17/2019 0.97  0.60 - 1.00 mg/dL Final   ??? BUN/Creatinine Ratio 05/17/2019 19   Final   ??? EGFR CKD-EPI Non-African American,* 05/17/2019 61  >=60 mL/min/1.8m2 Final   ??? EGFR CKD-EPI African American, Fem* 05/17/2019 70  >=60 mL/min/1.29m2 Final   ??? Glucose 05/17/2019 80  70 - 179 mg/dL Final   ??? Calcium 16/06/9603 8.9  8.5 - 10.2 mg/dL Final   ??? Albumin 54/05/8118 3.9  3.5 - 5.0 g/dL Final   ??? Total Protein 05/17/2019 6.9  6.5 - 8.3 g/dL Final   ??? Total Bilirubin 05/17/2019 0.6  0.0 - 1.2 mg/dL Final   ??? AST 14/78/2956 20  14 - 38 U/L Final   ??? ALT 05/17/2019 7  <35 U/L Final   ??? Alkaline Phosphatase 05/17/2019 91  38 - 126 U/L Final   ??? WBC 05/17/2019 3.3* 4.5 - 11.0 10*9/L Final   ??? RBC 05/17/2019 2.62* 4.00 - 5.20 10*12/L Final   ??? HGB 05/17/2019 9.8* 13.5 - 16.0 g/dL Final   ??? HCT 21/30/8657 29.6* 36.0 - 46.0 % Final   ??? MCV 05/17/2019 112.8* 80.0 - 100.0 fL Final   ??? MCH 05/17/2019 37.5* 26.0 - 34.0 pg Final   ??? MCHC 05/17/2019 33.3  31.0 - 37.0 g/dL Final   ??? RDW 84/69/6295 19.2* 12.0 - 15.0 % Final   ??? MPV 05/17/2019 8.4  7.0 - 10.0 fL Final   ??? Platelet 05/17/2019 100* 150 - 440 10*9/L Final   ??? Neutrophils % 05/17/2019 67.2  % Final   ??? Lymphocytes % 05/17/2019 14.8  % Final   ??? Monocytes % 05/17/2019 8.2  % Final   ??? Eosinophils % 05/17/2019 7.3  % Final   ??? Basophils % 05/17/2019 0.4  % Final   ??? Absolute Neutrophils 05/17/2019 2.2  2.0 - 7.5 10*9/L Final   ??? Absolute Lymphocytes 05/17/2019 0.5* 1.5 - 5.0 10*9/L Final   ??? Absolute Monocytes 05/17/2019 0.3  0.2 - 0.8 10*9/L Final   ??? Absolute Eosinophils 05/17/2019 0.2  0.0 - 0.4 10*9/L Final   ??? Absolute Basophils 05/17/2019 0.0  0.0 - 0.1 10*9/L Final   ??? Large Unstained Cells 05/17/2019 2  0 - 4 % Final   ??? Macrocytosis 05/17/2019 Marked* Not Present Final   ??? Anisocytosis 05/17/2019 Moderate* Not Present Final        Future Appointments:  Future Appointments   Date Time Provider Department Center   06/06/2019  8:20 AM Felicity Coyer, MD [facility]   06/06/2019 10:45 AM [facility]   06/06/2019 11:30 AM Marcy Panning, FNP [facility]       Please call our Nurse Navigator: Lucendia Herrlich, [phone] if you have any interval questions or concerns:    For appointments &  questions Monday through Friday 8 AM??? 5 PM   please call [phone] or Toll free [phone].    On Nights, Weekends and Holidays  Call [phone] and ask for the oncologist on call.    [facility]  [address]  [place], [place] [address]  [email or web address]    FOR CAREGIVERS: Follow this link for excellent information on care giving. LookLarge.fr    EXERCISE AND NUTRITION: It's important that you exercise to keep your muscles strong.  Walking is an excellent exercise and if you're not doing so we recommend that you walk 30 minutes or more a day - 5 times a week.  This is an excellent way to keep fit.    We recommend that you eat a healthy American diet with adequate servings of fruits and vegetables.  It's important to be aware of how many calories you were taking in each day so that you can maintain a reasonable body weight. If you are having trouble with your diet or with weight control please let us know and we can refer you to our nutritionist who can give you advice and help. This website may also be helpful DiscoHelp.si.    HERBS AND SUPPLEMENTS:  If you are taking herbs and supplements please use the following website to determine their safety: PhotoConference.no    BONE HEALTH: We recommend taking Vitamin D 1000 units per day and 1200 mg of calcium per day. You can get more information on calcium intake by Googling: Calcium NIH, or going to the following site: https://ods.CakeDeveloper.com.cy. Exercising is also most important. Some patients may need medication for bone health and your doctor may discuss this with you.    GENERAL SURVEILLANCE PLAN:These are the national guidelines from ASCO and NCCN. The recommendations for follow-up care for breast cancer include regular physical examinations, mammograms, and breast self-examinations. The follow-up care may be provided by your oncologist or primary care doctor, as long as your primary care doctor has communicated with your oncologist about appropriate follow-up care. In addition, patients with a possible or known family history of breast cancer should be referred to a Dentist.    ? Visit your doctor every three to six months for the first three years after the first treatment, every six to 12 months for years four and five, and every year thereafter.    ? Schedule a mammogram one year after your first mammogram that led to diagnosis, but no earlier than six months after radiation therapy. Obtain a mammogram every six to 12 months thereafter.    ? Perform a breast self-examination every month. This procedure is not a substitute for a mammogram.    ? Continue to visit a gynecologist regularly. Women taking tamoxifen should report any vaginal bleeding to their doctor.    In addition to the plan outlined above, please call us if you experience:   ? New lumps in the breast   ? Bone pain   ? Chest pain   ? Abdominal pain   ? Shortness of breath or difficulty breathing   ? Persistent headaches   ? Persistent coughing   ? Rash on breast   ? Nipple discharge (liquid coming from the nipple)     For emergencies, evenings or weekends, please call [phone] and ask for oncology fellow on call. Reasons to call emergency for patients receiving chemotherapy may include:    Fever of 100.5 or greater  Nausea and/or vomiting not relieved with nausea medicine  Diarrhea or constipation  not relieved with bowel regimen  Severe pain not relieved with usual pain regimen

## 2019-06-06 NOTE — Unmapped (Signed)
C/o swelling lower extremities and bottoms of feet.

## 2019-06-07 NOTE — Unmapped (Signed)
LVMTCB

## 2019-06-07 NOTE — Unmapped (Signed)
Spoke with pt, reviewed message, she expressed understanding, transferred to JT for scheduling

## 2019-06-07 NOTE — Unmapped (Signed)
Pt called back and was given results. She has an appointment 12/7/202 could her labs be done around that time?    If so, just call pt back and let her know to keep that appt.

## 2019-06-07 NOTE — Unmapped (Signed)
Call patient and inform her that her thyroid function is now much too low.  Ask her to stop the Methimazole.  Repeat the thyroid test in 2 months. She should have the labs checked and then have a phone visit with Dr. Graciela Husbands.  I will need for her to let me know what her weight is the day prior to the phone visit.

## 2019-06-08 NOTE — Unmapped (Signed)
Yes labs can be done then.

## 2019-06-08 NOTE — Unmapped (Signed)
Pt's port accessed - flushed, bld return noted, labs drawn and heplocked. Port de-accessed. Pt tolerated well.

## 2019-06-08 NOTE — Unmapped (Signed)
Provided follow-up counseling to Sonya Williams, 68 y.o. for treatment of tobacco use/dependence.     SUMMARY: SW re-assessed pt's tobacco use status and followed up about treatment plan. Pt has reduced use significantly since last session and now reports she is only smoking one little cigar per day. In fact, pt states that she often does not even smoke a full one, but she does not relight because the taste is too harsh. Pt states she has lost the taste for smoking, which has helped her cut down. Pt reported she is currently satisfied with her level of use, but later acknowledged that she knew it would be beneficial to quit entirely. SW encouraged pt to keep working toward her goal gradually. Pt will receive follow-up from SW.    Tobacco Use Treatment  Program: [facility]  Type of Visit: Follow-up  Session Number: 3  Tobacco Use Treatment Visit: Talked with patient  Permission To Engage In Conversation Re: PHI w/ Visitors Present: n/a  Goals Of Session: Insight, increase, Assessment, Risk behaviors, modulate, Behavior management, improve    Tobacco Use During Past 30 Days  Time Since Last Tobacco Use: other  Tobacco Withdrawal (Past 24 Hours): None noted  Type of Tobacco Products Used: Little cigars  Quantity Used: 1(Pt says she does not even smoke a full one)  Quantity Per: day    Behavioral Assessment  Why Uses: (1) Boredom. Pt reports she spends a lot of time home alone  Reasons to Become Tobacco Free: (1) Health  Barriers/Challenges: (1) Pt spends a lot of time home alone. She feels unwell and therefore struggles to engage in distracting activities (2) Pt reports that varenicline made her nauseous  Strategies: (1) Faith/spirituality. Pt reports this has helped her with cancer treatment (2) Pt enjoys spending time with her dog (3) Pt to implement a strategy of gradual reduction    Treatment Plan  Cessation Meds Currently Using: None  Outpatient/Discharge Medications Recommended: Patient declines Patient's Plan Post Discharge/Visit: Undecided  Follow-up Plan: Permission given, Phone follow-up scheduled  Family Members Included in Intervention/Plan: No    TTS Information  Diagnosis: Tobacco use disorder, unspecified, uncomplicated (F17.200)  Interventions: Assessed, Discussed, Informed, Suggested, Tx plan development, Encouraged  TTS Visit Length: >10 minutes  Follow-up Data: 1 month FU    Lesle Chris  Tobacco Treatment Specialist  [facility]  [phone]

## 2019-06-21 MED FILL — FUROSEMIDE 40 MG TABLET: ORAL | 30 days supply | Qty: 60 | Fill #2

## 2019-06-21 MED FILL — CLONAZEPAM 1 MG TABLET: 30 days supply | Qty: 60 | Fill #4 | Status: AC

## 2019-06-21 MED FILL — CLONAZEPAM 1 MG TABLET: ORAL | 30 days supply | Qty: 60 | Fill #4

## 2019-06-21 MED FILL — FUROSEMIDE 40 MG TABLET: 30 days supply | Qty: 60 | Fill #2 | Status: AC

## 2019-07-04 ENCOUNTER — Institutional Professional Consult (permissible substitution): Admit: 2019-07-04 | Discharge: 2019-07-04 | Payer: MEDICARE

## 2019-07-04 ENCOUNTER — Ambulatory Visit: Admit: 2019-07-04 | Discharge: 2019-07-04 | Payer: MEDICARE | Attending: Family | Primary: Family

## 2019-07-04 ENCOUNTER — Ambulatory Visit: Admit: 2019-07-04 | Discharge: 2019-07-04 | Payer: MEDICARE

## 2019-07-04 DIAGNOSIS — Z171 Estrogen receptor negative status [ER-]: Principal | ICD-10-CM

## 2019-07-04 DIAGNOSIS — C50412 Malignant neoplasm of upper-outer quadrant of left female breast: Principal | ICD-10-CM

## 2019-07-06 ENCOUNTER — Ambulatory Visit: Admit: 2019-07-06 | Discharge: 2019-07-07 | Payer: MEDICARE

## 2019-07-20 ENCOUNTER — Ambulatory Visit: Admit: 2019-07-20 | Discharge: 2019-07-21 | Payer: MEDICARE

## 2019-07-20 MED ORDER — GABAPENTIN 300 MG CAPSULE: 300 mg | capsule | Freq: Two times a day (BID) | 11 refills | 45 days | Status: AC

## 2019-07-20 MED ORDER — CLONAZEPAM 1 MG TABLET
ORAL_TABLET | Freq: Two times a day (BID) | ORAL | 5 refills | 30 days | Status: CP
Start: 2019-07-20 — End: ?
  Filled 2019-07-22: qty 60, 30d supply, fill #0

## 2019-07-20 MED ORDER — LEVETIRACETAM 500 MG TABLET: 750 mg | tablet | Freq: Three times a day (TID) | 11 refills | 34 days | Status: AC

## 2019-07-20 MED ORDER — LEVETIRACETAM 500 MG TABLET
ORAL_TABLET | Freq: Three times a day (TID) | ORAL | 11 refills | 34.00000 days | Status: CP
Start: 2019-07-20 — End: 2019-07-20

## 2019-07-20 MED ORDER — GABAPENTIN 300 MG CAPSULE
ORAL_CAPSULE | Freq: Two times a day (BID) | ORAL | 4 refills | 135.00000 days | Status: CP
Start: 2019-07-20 — End: 2020-07-19

## 2019-07-22 MED FILL — CLONAZEPAM 1 MG TABLET: 30 days supply | Qty: 60 | Fill #0 | Status: AC

## 2019-07-22 MED FILL — FUROSEMIDE 40 MG TABLET: ORAL | 30 days supply | Qty: 60 | Fill #3

## 2019-07-22 MED FILL — FUROSEMIDE 40 MG TABLET: 30 days supply | Qty: 60 | Fill #3 | Status: AC

## 2019-08-15 ENCOUNTER — Ambulatory Visit: Admit: 2019-08-15 | Discharge: 2019-08-16 | Payer: MEDICARE

## 2019-08-15 DIAGNOSIS — I509 Heart failure, unspecified: Principal | ICD-10-CM

## 2019-08-15 DIAGNOSIS — C50412 Malignant neoplasm of upper-outer quadrant of left female breast: Principal | ICD-10-CM

## 2019-08-15 DIAGNOSIS — Z171 Estrogen receptor negative status [ER-]: Secondary | ICD-10-CM

## 2019-08-15 DIAGNOSIS — Z79899 Other long term (current) drug therapy: Principal | ICD-10-CM

## 2019-08-17 ENCOUNTER — Institutional Professional Consult (permissible substitution): Admit: 2019-08-17 | Discharge: 2019-08-18 | Payer: MEDICARE | Attending: Family Medicine | Primary: Family Medicine

## 2019-08-17 DIAGNOSIS — R197 Diarrhea, unspecified: Principal | ICD-10-CM

## 2019-08-17 DIAGNOSIS — E876 Hypokalemia: Principal | ICD-10-CM

## 2019-08-17 DIAGNOSIS — E0500 Thyrotoxicosis with diffuse goiter without thyrotoxic crisis or storm: Principal | ICD-10-CM

## 2019-08-17 MED ORDER — COLESTIPOL 1 GRAM TABLET
ORAL_TABLET | 3 refills | 0 days | Status: CP
Start: 2019-08-17 — End: ?
  Filled 2019-08-22: qty 120, 20d supply, fill #0

## 2019-08-19 DIAGNOSIS — R197 Diarrhea, unspecified: Principal | ICD-10-CM

## 2019-08-22 MED FILL — FUROSEMIDE 40 MG TABLET: ORAL | 30 days supply | Qty: 60 | Fill #4

## 2019-08-22 MED FILL — CLONAZEPAM 1 MG TABLET: 30 days supply | Qty: 60 | Fill #1 | Status: AC

## 2019-08-22 MED FILL — FUROSEMIDE 40 MG TABLET: 30 days supply | Qty: 60 | Fill #4 | Status: AC

## 2019-08-22 MED FILL — CLONAZEPAM 1 MG TABLET: ORAL | 30 days supply | Qty: 60 | Fill #1

## 2019-08-22 MED FILL — COLESTIPOL 1 GRAM TABLET: 20 days supply | Qty: 120 | Fill #0 | Status: AC

## 2019-08-23 DIAGNOSIS — E876 Hypokalemia: Principal | ICD-10-CM

## 2019-08-29 ENCOUNTER — Ambulatory Visit: Admit: 2019-08-29 | Discharge: 2019-08-30 | Payer: MEDICARE

## 2019-08-29 DIAGNOSIS — E876 Hypokalemia: Principal | ICD-10-CM

## 2019-08-29 DIAGNOSIS — E0500 Thyrotoxicosis with diffuse goiter without thyrotoxic crisis or storm: Principal | ICD-10-CM

## 2019-08-31 DIAGNOSIS — E876 Hypokalemia: Principal | ICD-10-CM

## 2019-09-06 ENCOUNTER — Ambulatory Visit: Admit: 2019-09-06 | Discharge: 2019-09-07 | Payer: MEDICARE

## 2019-09-21 MED FILL — COLESTIPOL 1 GRAM TABLET: 30 days supply | Qty: 180 | Fill #1

## 2019-09-21 MED FILL — FUROSEMIDE 40 MG TABLET: ORAL | 30 days supply | Qty: 60 | Fill #5

## 2019-09-21 MED FILL — CLONAZEPAM 1 MG TABLET: 30 days supply | Qty: 60 | Fill #2 | Status: AC

## 2019-09-21 MED FILL — COLESTIPOL 1 GRAM TABLET: 30 days supply | Qty: 180 | Fill #1 | Status: AC

## 2019-09-21 MED FILL — FUROSEMIDE 40 MG TABLET: 30 days supply | Qty: 60 | Fill #5 | Status: AC

## 2019-09-21 MED FILL — CLONAZEPAM 1 MG TABLET: ORAL | 30 days supply | Qty: 60 | Fill #2

## 2019-10-03 ENCOUNTER — Ambulatory Visit: Admit: 2019-10-03 | Discharge: 2019-10-04 | Payer: MEDICARE

## 2019-10-03 ENCOUNTER — Ambulatory Visit: Admit: 2019-10-03 | Discharge: 2019-10-04 | Payer: MEDICARE | Attending: Family | Primary: Family

## 2019-10-03 DIAGNOSIS — Z171 Estrogen receptor negative status [ER-]: Principal | ICD-10-CM

## 2019-10-03 DIAGNOSIS — C50412 Malignant neoplasm of upper-outer quadrant of left female breast: Principal | ICD-10-CM

## 2019-10-18 ENCOUNTER — Ambulatory Visit: Admit: 2019-10-18 | Discharge: 2019-10-19 | Payer: MEDICARE

## 2019-10-19 ENCOUNTER — Institutional Professional Consult (permissible substitution): Admit: 2019-10-19 | Discharge: 2019-10-20 | Payer: MEDICARE | Attending: Family Medicine | Primary: Family Medicine

## 2019-10-19 DIAGNOSIS — E0500 Thyrotoxicosis with diffuse goiter without thyrotoxic crisis or storm: Principal | ICD-10-CM

## 2019-10-19 DIAGNOSIS — R197 Diarrhea, unspecified: Principal | ICD-10-CM

## 2019-10-21 MED ORDER — FUROSEMIDE 40 MG TABLET
ORAL_TABLET | Freq: Every day | ORAL | 5 refills | 30 days | Status: CP
Start: 2019-10-21 — End: ?
  Filled 2019-10-25: qty 60, 30d supply, fill #0

## 2019-10-25 MED FILL — FUROSEMIDE 40 MG TABLET: 30 days supply | Qty: 60 | Fill #0 | Status: AC

## 2019-10-25 MED FILL — CLONAZEPAM 1 MG TABLET: 30 days supply | Qty: 60 | Fill #3 | Status: AC

## 2019-10-25 MED FILL — CLONAZEPAM 1 MG TABLET: ORAL | 30 days supply | Qty: 60 | Fill #3

## 2019-11-11 MED ORDER — GABAPENTIN 300 MG CAPSULE
ORAL_CAPSULE | Freq: Two times a day (BID) | ORAL | 4 refills | 135 days | Status: CP
Start: 2019-11-11 — End: 2020-11-10

## 2019-11-15 ENCOUNTER — Ambulatory Visit: Admit: 2019-11-15 | Discharge: 2019-11-16 | Payer: MEDICARE

## 2019-11-18 MED FILL — COLESTIPOL 1 GRAM TABLET: 30 days supply | Qty: 180 | Fill #2 | Status: AC

## 2019-11-18 MED FILL — CLONAZEPAM 1 MG TABLET: 30 days supply | Qty: 60 | Fill #4 | Status: AC

## 2019-11-18 MED FILL — FUROSEMIDE 40 MG TABLET: 30 days supply | Qty: 60 | Fill #1 | Status: AC

## 2019-11-18 MED FILL — COLESTIPOL 1 GRAM TABLET: 30 days supply | Qty: 180 | Fill #2

## 2019-11-18 MED FILL — CLONAZEPAM 1 MG TABLET: ORAL | 30 days supply | Qty: 60 | Fill #4

## 2019-11-18 MED FILL — FUROSEMIDE 40 MG TABLET: ORAL | 30 days supply | Qty: 60 | Fill #1

## 2019-12-05 ENCOUNTER — Ambulatory Visit: Admit: 2019-12-05 | Discharge: 2019-12-06 | Payer: MEDICARE | Attending: Family Medicine | Primary: Family Medicine

## 2019-12-05 DIAGNOSIS — E0500 Thyrotoxicosis with diffuse goiter without thyrotoxic crisis or storm: Principal | ICD-10-CM

## 2019-12-05 DIAGNOSIS — M79651 Pain in right thigh: Secondary | ICD-10-CM

## 2019-12-05 DIAGNOSIS — M79652 Pain in left thigh: Principal | ICD-10-CM

## 2019-12-05 DIAGNOSIS — E876 Hypokalemia: Principal | ICD-10-CM

## 2019-12-05 DIAGNOSIS — R197 Diarrhea, unspecified: Principal | ICD-10-CM

## 2019-12-05 MED ORDER — MAGNESIUM OXIDE 400 MG (241.3 MG MAGNESIUM) TABLET
Freq: Every day | ORAL | 0 days
Start: 2019-12-05 — End: ?

## 2019-12-07 MED ORDER — ATORVASTATIN 40 MG TABLET
ORAL_TABLET | 0 refills | 0 days | Status: CP
Start: 2019-12-07 — End: ?

## 2019-12-19 ENCOUNTER — Ambulatory Visit: Admit: 2019-12-19 | Discharge: 2019-12-20 | Payer: MEDICARE

## 2019-12-19 MED FILL — CLONAZEPAM 1 MG TABLET: ORAL | 30 days supply | Qty: 60 | Fill #5

## 2019-12-19 MED FILL — CLONAZEPAM 1 MG TABLET: 30 days supply | Qty: 60 | Fill #5 | Status: AC

## 2020-01-03 ENCOUNTER — Institutional Professional Consult (permissible substitution)
Admit: 2020-01-03 | Discharge: 2020-01-04 | Payer: MEDICARE | Attending: Hematology & Oncology | Primary: Hematology & Oncology

## 2020-01-16 MED ORDER — CLONAZEPAM 1 MG TABLET
ORAL_TABLET | Freq: Two times a day (BID) | ORAL | 5 refills | 30 days | Status: CP
Start: 2020-01-16 — End: ?

## 2020-01-18 ENCOUNTER — Ambulatory Visit: Admit: 2020-01-18 | Discharge: 2020-01-19 | Payer: MEDICARE

## 2020-01-18 DIAGNOSIS — G40219 Localization-related (focal) (partial) symptomatic epilepsy and epileptic syndromes with complex partial seizures, intractable, without status epilepticus: Principal | ICD-10-CM

## 2020-01-18 MED ORDER — LEVETIRACETAM 500 MG TABLET
ORAL_TABLET | Freq: Three times a day (TID) | ORAL | 4 refills | 100.00000 days | Status: CP
Start: 2020-01-18 — End: ?

## 2020-01-18 MED ORDER — CLONAZEPAM 1 MG TABLET
ORAL_TABLET | Freq: Two times a day (BID) | ORAL | 5 refills | 30.00000 days | Status: CP
Start: 2020-01-18 — End: ?
  Filled 2020-01-18: qty 60, 30d supply, fill #0

## 2020-01-18 MED ORDER — GABAPENTIN 300 MG CAPSULE
ORAL_CAPSULE | Freq: Two times a day (BID) | ORAL | 4 refills | 90 days | Status: CP
Start: 2020-01-18 — End: 2021-01-17

## 2020-01-18 MED FILL — FUROSEMIDE 40 MG TABLET: 30 days supply | Qty: 60 | Fill #2 | Status: AC

## 2020-01-18 MED FILL — FUROSEMIDE 40 MG TABLET: ORAL | 30 days supply | Qty: 60 | Fill #2

## 2020-01-18 MED FILL — CLONAZEPAM 1 MG TABLET: 30 days supply | Qty: 60 | Fill #0 | Status: AC

## 2020-02-02 MED ORDER — ATORVASTATIN 40 MG TABLET
ORAL_TABLET | 0 refills | 0 days | Status: CP
Start: 2020-02-02 — End: ?

## 2020-02-03 MED ORDER — CHANTIX 1 MG TABLET
ORAL_TABLET | 0 refills | 0 days
Start: 2020-02-03 — End: ?

## 2020-02-08 ENCOUNTER — Ambulatory Visit: Admit: 2020-02-08 | Discharge: 2020-02-09 | Payer: MEDICARE | Attending: Family Medicine | Primary: Family Medicine

## 2020-02-08 DIAGNOSIS — E876 Hypokalemia: Principal | ICD-10-CM

## 2020-02-08 DIAGNOSIS — R252 Cramp and spasm: Principal | ICD-10-CM

## 2020-02-08 DIAGNOSIS — K08109 Complete loss of teeth, unspecified cause, unspecified class: Principal | ICD-10-CM

## 2020-02-08 DIAGNOSIS — R197 Diarrhea, unspecified: Principal | ICD-10-CM

## 2020-02-08 DIAGNOSIS — S025XXB Fracture of tooth (traumatic), initial encounter for open fracture: Principal | ICD-10-CM

## 2020-02-08 MED ORDER — COLESTIPOL 1 GRAM TABLET
ORAL_TABLET | 3 refills | 0 days | Status: CP
Start: 2020-02-08 — End: ?
  Filled 2020-02-13: qty 180, 30d supply, fill #0

## 2020-02-13 MED FILL — CLONAZEPAM 1 MG TABLET: ORAL | 30 days supply | Qty: 60 | Fill #1

## 2020-02-13 MED FILL — CLONAZEPAM 1 MG TABLET: 30 days supply | Qty: 60 | Fill #1 | Status: AC

## 2020-02-13 MED FILL — FUROSEMIDE 40 MG TABLET: 30 days supply | Qty: 60 | Fill #3 | Status: AC

## 2020-02-13 MED FILL — FUROSEMIDE 40 MG TABLET: ORAL | 30 days supply | Qty: 60 | Fill #3

## 2020-02-13 MED FILL — COLESTIPOL 1 GRAM TABLET: 30 days supply | Qty: 180 | Fill #0 | Status: AC

## 2020-02-14 ENCOUNTER — Ambulatory Visit
Admit: 2020-02-14 | Discharge: 2020-02-15 | Disposition: A | Discharge status: Home / Self Care | Payer: MEDICARE | Attending: Emergency Medicine

## 2020-02-14 ENCOUNTER — Emergency Department
Admit: 2020-02-14 | Discharge: 2020-02-15 | Disposition: A | Discharge status: Home / Self Care | Payer: MEDICARE | Attending: Emergency Medicine

## 2020-02-14 DIAGNOSIS — R112 Nausea with vomiting, unspecified: Principal | ICD-10-CM

## 2020-02-14 DIAGNOSIS — N3000 Acute cystitis without hematuria: Principal | ICD-10-CM

## 2020-02-14 DIAGNOSIS — R197 Diarrhea, unspecified: Secondary | ICD-10-CM

## 2020-02-14 MED ORDER — CEFDINIR 300 MG CAPSULE
ORAL_CAPSULE | Freq: Two times a day (BID) | ORAL | 0 refills | 10 days | Status: CP
Start: 2020-02-14 — End: 2020-02-24
  Filled 2020-02-15: qty 20, 10d supply, fill #0

## 2020-02-15 MED FILL — CEFDINIR 300 MG CAPSULE: 10 days supply | Qty: 20 | Fill #0 | Status: AC

## 2020-02-21 MED ORDER — FUROSEMIDE 40 MG TABLET
ORAL_TABLET | Freq: Every day | ORAL | 1 refills | 90.00000 days | Status: CP
Start: 2020-02-21 — End: 2020-02-21

## 2020-02-21 MED ORDER — FUROSEMIDE 40 MG TABLET: 80 mg | tablet | Freq: Every day | 1 refills | 90 days | Status: AC

## 2020-02-24 MED ORDER — COLESTIPOL 1 GRAM TABLET
ORAL_TABLET | 3 refills | 0 days | Status: CP
Start: 2020-02-24 — End: ?

## 2020-02-29 ENCOUNTER — Ambulatory Visit: Admit: 2020-02-29 | Discharge: 2020-03-01 | Payer: MEDICARE | Attending: Family Medicine | Primary: Family Medicine

## 2020-02-29 DIAGNOSIS — D649 Anemia, unspecified: Principal | ICD-10-CM

## 2020-02-29 DIAGNOSIS — E876 Hypokalemia: Principal | ICD-10-CM

## 2020-02-29 DIAGNOSIS — N10 Acute tubulo-interstitial nephritis: Principal | ICD-10-CM

## 2020-02-29 DIAGNOSIS — G40219 Localization-related (focal) (partial) symptomatic epilepsy and epileptic syndromes with complex partial seizures, intractable, without status epilepticus: Principal | ICD-10-CM

## 2020-03-03 ENCOUNTER — Emergency Department
Admit: 2020-03-03 | Discharge: 2020-03-03 | Disposition: A | Discharge status: Home / Self Care | Payer: MEDICARE | Attending: Emergency Medicine

## 2020-03-03 ENCOUNTER — Ambulatory Visit
Admit: 2020-03-03 | Discharge: 2020-03-03 | Disposition: A | Discharge status: Home / Self Care | Payer: MEDICARE | Attending: Emergency Medicine

## 2020-03-03 DIAGNOSIS — S52501A Unspecified fracture of the lower end of right radius, initial encounter for closed fracture: Principal | ICD-10-CM

## 2020-03-12 DIAGNOSIS — G40219 Localization-related (focal) (partial) symptomatic epilepsy and epileptic syndromes with complex partial seizures, intractable, without status epilepticus: Principal | ICD-10-CM

## 2020-03-12 MED ORDER — LACOSAMIDE 100 MG TABLET: tablet | 1 refills | 0 days | Status: AC

## 2020-03-12 MED ORDER — VIMPAT 50 MG TABLET
ORAL_TABLET | 5 refills | 0 days | Status: CP
Start: 2020-03-12 — End: ?

## 2020-03-12 MED ORDER — LACOSAMIDE 100 MG TABLET
ORAL_TABLET | 1 refills | 0.00000 days | Status: CP
Start: 2020-03-12 — End: 2020-03-12
  Filled 2020-03-14: qty 60, 30d supply, fill #0

## 2020-03-12 MED ORDER — LEVETIRACETAM 500 MG TABLET
ORAL_TABLET | 4 refills | 0 days
Start: 2020-03-12 — End: ?

## 2020-03-12 MED ORDER — FUROSEMIDE 40 MG TABLET
ORAL_TABLET | Freq: Every day | ORAL | 5 refills | 30.00000 days
Start: 2020-03-12 — End: ?

## 2020-03-14 MED FILL — VIMPAT 50 MG TABLET: 14 days supply | Qty: 35 | Fill #0 | Status: AC

## 2020-03-14 MED FILL — VIMPAT 50 MG TABLET: 14 days supply | Qty: 35 | Fill #0

## 2020-03-14 MED FILL — VIMPAT 100 MG TABLET: 30 days supply | Qty: 60 | Fill #0 | Status: AC

## 2020-03-14 MED FILL — CLONAZEPAM 1 MG TABLET: ORAL | 30 days supply | Qty: 60 | Fill #2

## 2020-03-14 MED FILL — CLONAZEPAM 1 MG TABLET: 30 days supply | Qty: 60 | Fill #2 | Status: AC

## 2020-03-20 ENCOUNTER — Ambulatory Visit: Admit: 2020-03-20 | Discharge: 2020-03-21 | Payer: MEDICARE

## 2020-04-12 MED ORDER — ATORVASTATIN 40 MG TABLET
ORAL_TABLET | 0 refills | 0 days | Status: CP
Start: 2020-04-12 — End: ?

## 2020-04-17 ENCOUNTER — Ambulatory Visit: Admit: 2020-04-17 | Discharge: 2020-04-18 | Payer: MEDICARE

## 2020-04-23 ENCOUNTER — Ambulatory Visit: Admit: 2020-04-23 | Discharge: 2020-04-23 | Payer: MEDICARE

## 2020-04-23 ENCOUNTER — Ambulatory Visit: Admit: 2020-04-23 | Discharge: 2020-04-23 | Payer: MEDICARE | Attending: Family | Primary: Family

## 2020-04-23 MED ORDER — COLESTIPOL 1 GRAM TABLET
ORAL_TABLET | 0 refills | 0 days
Start: 2020-04-23 — End: ?

## 2020-04-24 MED ORDER — COLESTIPOL 1 GRAM TABLET
ORAL_TABLET | 0 refills | 0 days
Start: 2020-04-24 — End: ?

## 2020-04-25 ENCOUNTER — Ambulatory Visit: Admit: 2020-04-25 | Discharge: 2020-04-26 | Payer: MEDICARE | Attending: Surgical Oncology | Primary: Surgical Oncology

## 2020-04-25 DIAGNOSIS — Z1231 Encounter for screening mammogram for malignant neoplasm of breast: Principal | ICD-10-CM

## 2020-04-25 DIAGNOSIS — C50912 Malignant neoplasm of unspecified site of left female breast: Principal | ICD-10-CM

## 2020-04-25 MED FILL — CLONAZEPAM 1 MG TABLET: 30 days supply | Qty: 60 | Fill #3 | Status: AC

## 2020-04-25 MED FILL — VIMPAT 100 MG TABLET: 30 days supply | Qty: 60 | Fill #1

## 2020-04-25 MED FILL — CLONAZEPAM 1 MG TABLET: ORAL | 30 days supply | Qty: 60 | Fill #3

## 2020-04-25 MED FILL — VIMPAT 100 MG TABLET: 30 days supply | Qty: 60 | Fill #1 | Status: AC

## 2020-04-26 ENCOUNTER — Ambulatory Visit: Admit: 2020-04-26 | Discharge: 2020-04-27 | Payer: MEDICARE | Attending: Family | Primary: Family

## 2020-04-26 DIAGNOSIS — T881XXA Other complications following immunization, not elsewhere classified, initial encounter: Principal | ICD-10-CM

## 2020-04-26 DIAGNOSIS — Z23 Encounter for immunization: Principal | ICD-10-CM

## 2020-04-26 DIAGNOSIS — C50412 Malignant neoplasm of upper-outer quadrant of left female breast: Principal | ICD-10-CM

## 2020-04-26 DIAGNOSIS — Z171 Estrogen receptor negative status [ER-]: Secondary | ICD-10-CM

## 2020-05-03 ENCOUNTER — Ambulatory Visit: Admit: 2020-05-03 | Discharge: 2020-05-03 | Payer: MEDICARE

## 2020-05-03 DIAGNOSIS — C50412 Malignant neoplasm of upper-outer quadrant of left female breast: Principal | ICD-10-CM

## 2020-05-03 DIAGNOSIS — Z171 Estrogen receptor negative status [ER-]: Secondary | ICD-10-CM

## 2020-05-03 DIAGNOSIS — Z23 Encounter for immunization: Principal | ICD-10-CM

## 2020-05-03 DIAGNOSIS — T881XXA Other complications following immunization, not elsewhere classified, initial encounter: Principal | ICD-10-CM

## 2020-05-15 ENCOUNTER — Ambulatory Visit: Admit: 2020-05-15 | Discharge: 2020-05-15 | Payer: MEDICARE

## 2020-05-15 ENCOUNTER — Ambulatory Visit: Admit: 2020-05-15 | Discharge: 2020-05-17 | Payer: MEDICARE

## 2020-05-15 DIAGNOSIS — Z171 Estrogen receptor negative status [ER-]: Principal | ICD-10-CM

## 2020-05-15 DIAGNOSIS — C50412 Malignant neoplasm of upper-outer quadrant of left female breast: Principal | ICD-10-CM

## 2020-05-16 MED ORDER — DAPAGLIFLOZIN 10 MG TABLET
ORAL_TABLET | 0 refills | 0 days
Start: 2020-05-16 — End: 2020-05-16

## 2020-05-16 MED ORDER — EMPAGLIFLOZIN 10 MG TABLET
ORAL_TABLET | 0 refills | 0 days
Start: 2020-05-16 — End: 2020-05-16

## 2020-05-17 MED ORDER — EMPAGLIFLOZIN 10 MG TABLET
ORAL_TABLET | Freq: Every day | ORAL | 11 refills | 30.00000 days | Status: CP
Start: 2020-05-17 — End: ?
  Filled 2020-05-17: qty 30, 30d supply, fill #0

## 2020-05-17 MED ORDER — CARVEDILOL 12.5 MG TABLET
ORAL_TABLET | Freq: Two times a day (BID) | ORAL | 11 refills | 30.00000 days | Status: CP
Start: 2020-05-17 — End: ?
  Filled 2020-05-17: qty 60, 30d supply, fill #0

## 2020-05-17 MED ORDER — NITROGLYCERIN 0.4 MG SUBLINGUAL TABLET
ORAL_TABLET | SUBLINGUAL | 11 refills | 1 days | Status: CP | PRN
Start: 2020-05-17 — End: ?
  Filled 2020-05-17: qty 25, 5d supply, fill #0

## 2020-05-17 MED ORDER — SPIRONOLACTONE 25 MG TABLET
ORAL_TABLET | Freq: Every day | ORAL | 11 refills | 30 days | Status: CP
Start: 2020-05-17 — End: ?
  Filled 2020-05-17: qty 15, 30d supply, fill #0

## 2020-05-17 MED FILL — CARVEDILOL 12.5 MG TABLET: 30 days supply | Qty: 60 | Fill #0 | Status: AC

## 2020-05-17 MED FILL — SPIRONOLACTONE 25 MG TABLET: 30 days supply | Qty: 15 | Fill #0 | Status: AC

## 2020-05-17 MED FILL — JARDIANCE 10 MG TABLET: 30 days supply | Qty: 30 | Fill #0 | Status: AC

## 2020-05-17 MED FILL — NITROGLYCERIN 0.4 MG SUBLINGUAL TABLET: 5 days supply | Qty: 25 | Fill #0 | Status: AC

## 2020-05-23 ENCOUNTER — Ambulatory Visit: Admit: 2020-05-23 | Discharge: 2020-05-24 | Payer: MEDICARE

## 2020-05-23 DIAGNOSIS — G40219 Localization-related (focal) (partial) symptomatic epilepsy and epileptic syndromes with complex partial seizures, intractable, without status epilepticus: Principal | ICD-10-CM

## 2020-05-23 MED ORDER — LEVETIRACETAM 500 MG TABLET
ORAL_TABLET | 4 refills | 0 days | Status: CP
Start: 2020-05-23 — End: ?

## 2020-05-23 MED ORDER — GABAPENTIN 300 MG CAPSULE
ORAL_CAPSULE | Freq: Two times a day (BID) | ORAL | 4 refills | 90.00000 days | Status: CP
Start: 2020-05-23 — End: 2021-05-23

## 2020-05-23 MED ORDER — CLONAZEPAM 1 MG TABLET
ORAL_TABLET | Freq: Two times a day (BID) | ORAL | 1 refills | 90.00000 days | Status: CP
Start: 2020-05-23 — End: ?
  Filled 2020-05-31: qty 60, 30d supply, fill #0

## 2020-05-23 MED ORDER — LACOSAMIDE 100 MG TABLET
ORAL_TABLET | Freq: Two times a day (BID) | ORAL | 1 refills | 90 days | Status: CP
Start: 2020-05-23 — End: ?
  Filled 2020-05-31: qty 60, 30d supply, fill #0

## 2020-05-25 DIAGNOSIS — E876 Hypokalemia: Principal | ICD-10-CM

## 2020-05-25 DIAGNOSIS — I10 Essential (primary) hypertension: Principal | ICD-10-CM

## 2020-05-26 ENCOUNTER — Ambulatory Visit: Admit: 2020-05-26 | Discharge: 2020-05-27 | Payer: MEDICARE

## 2020-05-26 DIAGNOSIS — I208 Other forms of angina pectoris: Principal | ICD-10-CM

## 2020-05-26 DIAGNOSIS — I110 Hypertensive heart disease with heart failure: Principal | ICD-10-CM

## 2020-05-26 DIAGNOSIS — E876 Hypokalemia: Principal | ICD-10-CM

## 2020-05-26 DIAGNOSIS — I10 Essential (primary) hypertension: Principal | ICD-10-CM

## 2020-05-28 ENCOUNTER — Other Ambulatory Visit: Admit: 2020-05-28 | Discharge: 2020-05-29 | Payer: MEDICARE

## 2020-05-28 DIAGNOSIS — R7989 Other specified abnormal findings of blood chemistry: Principal | ICD-10-CM

## 2020-05-28 DIAGNOSIS — E876 Hypokalemia: Principal | ICD-10-CM

## 2020-05-29 ENCOUNTER — Ambulatory Visit: Admit: 2020-05-29 | Discharge: 2020-05-30 | Payer: MEDICARE

## 2020-05-31 MED FILL — CLONAZEPAM 1 MG TABLET: 30 days supply | Qty: 60 | Fill #0 | Status: AC

## 2020-05-31 MED FILL — VIMPAT 100 MG TABLET: 30 days supply | Qty: 60 | Fill #0 | Status: AC

## 2020-06-01 ENCOUNTER — Ambulatory Visit: Admit: 2020-06-01 | Discharge: 2020-06-01 | Payer: MEDICARE

## 2020-06-04 ENCOUNTER — Ambulatory Visit: Admit: 2020-06-04 | Discharge: 2020-06-05 | Payer: MEDICARE

## 2020-06-04 DIAGNOSIS — R7989 Other specified abnormal findings of blood chemistry: Principal | ICD-10-CM

## 2020-06-04 DIAGNOSIS — I208 Other forms of angina pectoris: Principal | ICD-10-CM

## 2020-06-08 ENCOUNTER — Ambulatory Visit: Admit: 2020-06-08 | Discharge: 2020-06-09 | Payer: MEDICARE

## 2020-06-08 DIAGNOSIS — I2510 Atherosclerotic heart disease of native coronary artery without angina pectoris: Principal | ICD-10-CM

## 2020-06-11 ENCOUNTER — Ambulatory Visit
Admit: 2020-06-11 | Discharge: 2020-06-12 | Payer: MEDICARE | Attending: Student in an Organized Health Care Education/Training Program | Primary: Student in an Organized Health Care Education/Training Program

## 2020-06-11 DIAGNOSIS — I10 Essential (primary) hypertension: Principal | ICD-10-CM

## 2020-06-11 DIAGNOSIS — Z7689 Persons encountering health services in other specified circumstances: Principal | ICD-10-CM

## 2020-06-11 DIAGNOSIS — I2510 Atherosclerotic heart disease of native coronary artery without angina pectoris: Principal | ICD-10-CM

## 2020-06-11 DIAGNOSIS — G40219 Localization-related (focal) (partial) symptomatic epilepsy and epileptic syndromes with complex partial seizures, intractable, without status epilepticus: Principal | ICD-10-CM

## 2020-06-11 MED ORDER — AMLODIPINE 10 MG TABLET
ORAL_TABLET | 3 refills | 0 days | Status: CP
Start: 2020-06-11 — End: ?

## 2020-06-18 MED ORDER — ATORVASTATIN 40 MG TABLET
ORAL_TABLET | 1 refills | 0 days | Status: CP
Start: 2020-06-18 — End: ?

## 2020-06-19 MED ORDER — FUROSEMIDE 40 MG TABLET
ORAL_TABLET | Freq: Every day | ORAL | 1 refills | 90 days | Status: CP
Start: 2020-06-19 — End: ?

## 2020-06-19 MED ORDER — LOSARTAN 100 MG TABLET
ORAL_TABLET | Freq: Every day | ORAL | 1 refills | 90 days | Status: CP
Start: 2020-06-19 — End: ?

## 2020-06-29 MED FILL — CLONAZEPAM 1 MG TABLET: 30 days supply | Qty: 60 | Fill #1 | Status: AC

## 2020-06-29 MED FILL — VIMPAT 100 MG TABLET: ORAL | 30 days supply | Qty: 60 | Fill #1

## 2020-06-29 MED FILL — VIMPAT 100 MG TABLET: 30 days supply | Qty: 60 | Fill #1 | Status: AC

## 2020-06-29 MED FILL — CLONAZEPAM 1 MG TABLET: ORAL | 30 days supply | Qty: 60 | Fill #1

## 2020-07-13 MED ORDER — ATORVASTATIN 40 MG TABLET
ORAL_TABLET | Freq: Every day | ORAL | 1 refills | 90 days
Start: 2020-07-13 — End: ?

## 2020-07-13 MED ORDER — EMPAGLIFLOZIN 10 MG TABLET
ORAL_TABLET | Freq: Every day | ORAL | 11 refills | 30 days
Start: 2020-07-13 — End: ?

## 2020-07-18 ENCOUNTER — Ambulatory Visit: Admit: 2020-07-18 | Discharge: 2020-07-19 | Payer: MEDICARE

## 2020-07-18 DIAGNOSIS — G40219 Localization-related (focal) (partial) symptomatic epilepsy and epileptic syndromes with complex partial seizures, intractable, without status epilepticus: Principal | ICD-10-CM

## 2020-07-18 DIAGNOSIS — Z6823 Body mass index (BMI) 23.0-23.9, adult: Principal | ICD-10-CM

## 2020-07-18 MED ORDER — LACOSAMIDE 150 MG TABLET
ORAL_TABLET | Freq: Two times a day (BID) | ORAL | 5 refills | 90 days | Status: CP
Start: 2020-07-18 — End: ?

## 2020-08-01 MED FILL — CLONAZEPAM 1 MG TABLET: ORAL | 30 days supply | Qty: 60 | Fill #2

## 2020-08-01 MED FILL — CLONAZEPAM 1 MG TABLET: 30 days supply | Qty: 60 | Fill #2 | Status: AC

## 2020-08-31 MED FILL — CLONAZEPAM 1 MG TABLET: 30 days supply | Qty: 60 | Fill #3 | Status: AC

## 2020-08-31 MED FILL — CLONAZEPAM 1 MG TABLET: ORAL | 30 days supply | Qty: 60 | Fill #3

## 2020-09-05 MED ORDER — ALBUTEROL SULFATE HFA 90 MCG/ACTUATION AEROSOL INHALER
RESPIRATORY_TRACT | 0 refills | 0 days
Start: 2020-09-05 — End: ?

## 2020-09-05 MED ORDER — AZITHROMYCIN 500 MG TABLET
ORAL_TABLET | Freq: Every day | ORAL | 0 refills | 7 days
Start: 2020-09-05 — End: ?

## 2020-09-12 MED FILL — AZITHROMYCIN 500 MG TABLET: ORAL | 7 days supply | Qty: 7 | Fill #0

## 2020-09-12 MED FILL — ALBUTEROL SULFATE HFA 90 MCG/ACTUATION AEROSOL INHALER: RESPIRATORY_TRACT | 50 days supply | Qty: 8.5 | Fill #0

## 2020-09-21 MED ORDER — ALBUTEROL SULFATE HFA 90 MCG/ACTUATION AEROSOL INHALER
RESPIRATORY_TRACT | 0 refills | 0 days
Start: 2020-09-21 — End: ?

## 2020-09-25 ENCOUNTER — Ambulatory Visit
Admit: 2020-09-25 | Discharge: 2020-09-25 | Payer: MEDICARE | Attending: Hematology & Oncology | Primary: Hematology & Oncology

## 2020-09-25 ENCOUNTER — Ambulatory Visit: Admit: 2020-09-25 | Discharge: 2020-09-25 | Payer: MEDICARE

## 2020-09-25 DIAGNOSIS — Z66 Do not resuscitate: Principal | ICD-10-CM

## 2020-09-25 DIAGNOSIS — Z171 Estrogen receptor negative status [ER-]: Principal | ICD-10-CM

## 2020-09-25 DIAGNOSIS — W19XXXA Unspecified fall, initial encounter: Principal | ICD-10-CM

## 2020-09-25 DIAGNOSIS — Z853 Personal history of malignant neoplasm of breast: Principal | ICD-10-CM

## 2020-09-25 DIAGNOSIS — Y92009 Unspecified place in unspecified non-institutional (private) residence as the place of occurrence of the external cause: Principal | ICD-10-CM

## 2020-09-25 DIAGNOSIS — C50412 Malignant neoplasm of upper-outer quadrant of left female breast: Principal | ICD-10-CM

## 2020-09-28 MED FILL — CLONAZEPAM 1 MG TABLET: ORAL | 30 days supply | Qty: 60 | Fill #4

## 2020-10-02 ENCOUNTER — Ambulatory Visit
Admit: 2020-10-02 | Discharge: 2020-10-03 | Payer: MEDICARE | Attending: Student in an Organized Health Care Education/Training Program | Primary: Student in an Organized Health Care Education/Training Program

## 2020-10-02 DIAGNOSIS — I110 Hypertensive heart disease with heart failure: Principal | ICD-10-CM

## 2020-10-02 DIAGNOSIS — G40219 Localization-related (focal) (partial) symptomatic epilepsy and epileptic syndromes with complex partial seizures, intractable, without status epilepticus: Principal | ICD-10-CM

## 2020-10-02 DIAGNOSIS — R296 Repeated falls: Principal | ICD-10-CM

## 2020-10-02 DIAGNOSIS — I214 Non-ST elevation (NSTEMI) myocardial infarction: Principal | ICD-10-CM

## 2020-10-02 DIAGNOSIS — Z171 Estrogen receptor negative status [ER-]: Principal | ICD-10-CM

## 2020-10-02 DIAGNOSIS — W19XXXS Unspecified fall, sequela: Principal | ICD-10-CM

## 2020-10-02 DIAGNOSIS — C50412 Malignant neoplasm of upper-outer quadrant of left female breast: Principal | ICD-10-CM

## 2020-10-03 ENCOUNTER — Emergency Department: Payer: Medicare HMO

## 2020-10-03 ENCOUNTER — Other Ambulatory Visit: Payer: Self-pay

## 2020-10-03 ENCOUNTER — Observation Stay
Admission: EM | Admit: 2020-10-03 | Discharge: 2020-10-05 | Disposition: A | Discharge status: Home / Self Care | Payer: Medicare HMO | Attending: Internal Medicine | Admitting: Internal Medicine

## 2020-10-03 DIAGNOSIS — R4182 Altered mental status, unspecified: Secondary | ICD-10-CM | POA: Diagnosis not present

## 2020-10-03 DIAGNOSIS — I2510 Atherosclerotic heart disease of native coronary artery without angina pectoris: Secondary | ICD-10-CM | POA: Diagnosis not present

## 2020-10-03 DIAGNOSIS — I10 Essential (primary) hypertension: Secondary | ICD-10-CM

## 2020-10-03 DIAGNOSIS — I110 Hypertensive heart disease with heart failure: Secondary | ICD-10-CM | POA: Diagnosis not present

## 2020-10-03 DIAGNOSIS — Z20822 Contact with and (suspected) exposure to covid-19: Secondary | ICD-10-CM | POA: Insufficient documentation

## 2020-10-03 DIAGNOSIS — J449 Chronic obstructive pulmonary disease, unspecified: Secondary | ICD-10-CM | POA: Diagnosis not present

## 2020-10-03 DIAGNOSIS — G928 Other toxic encephalopathy: Secondary | ICD-10-CM | POA: Diagnosis not present

## 2020-10-03 DIAGNOSIS — W19XXXA Unspecified fall, initial encounter: Secondary | ICD-10-CM

## 2020-10-03 DIAGNOSIS — Z79899 Other long term (current) drug therapy: Secondary | ICD-10-CM | POA: Diagnosis not present

## 2020-10-03 DIAGNOSIS — R569 Unspecified convulsions: Secondary | ICD-10-CM

## 2020-10-03 DIAGNOSIS — F17210 Nicotine dependence, cigarettes, uncomplicated: Secondary | ICD-10-CM | POA: Diagnosis not present

## 2020-10-03 DIAGNOSIS — Y92009 Unspecified place in unspecified non-institutional (private) residence as the place of occurrence of the external cause: Secondary | ICD-10-CM

## 2020-10-03 DIAGNOSIS — I5032 Chronic diastolic (congestive) heart failure: Secondary | ICD-10-CM | POA: Diagnosis not present

## 2020-10-03 DIAGNOSIS — T5891XA Toxic effect of carbon monoxide from unspecified source, accidental (unintentional), initial encounter: Principal | ICD-10-CM | POA: Insufficient documentation

## 2020-10-03 DIAGNOSIS — W1839XA Other fall on same level, initial encounter: Secondary | ICD-10-CM | POA: Diagnosis not present

## 2020-10-03 DIAGNOSIS — T5891XD Toxic effect of carbon monoxide from unspecified source, accidental (unintentional), subsequent encounter: Secondary | ICD-10-CM | POA: Diagnosis not present

## 2020-10-03 DIAGNOSIS — F32A Depression, unspecified: Secondary | ICD-10-CM | POA: Diagnosis present

## 2020-10-03 DIAGNOSIS — E876 Hypokalemia: Secondary | ICD-10-CM | POA: Diagnosis not present

## 2020-10-03 DIAGNOSIS — S42254A Nondisplaced fracture of greater tuberosity of right humerus, initial encounter for closed fracture: Secondary | ICD-10-CM | POA: Diagnosis not present

## 2020-10-03 DIAGNOSIS — I5030 Unspecified diastolic (congestive) heart failure: Secondary | ICD-10-CM | POA: Diagnosis present

## 2020-10-03 DIAGNOSIS — R531 Weakness: Secondary | ICD-10-CM | POA: Diagnosis present

## 2020-10-03 DIAGNOSIS — Z23 Encounter for immunization: Secondary | ICD-10-CM | POA: Insufficient documentation

## 2020-10-03 DIAGNOSIS — G40909 Epilepsy, unspecified, not intractable, without status epilepticus: Secondary | ICD-10-CM

## 2020-10-03 LAB — CBC WITH DIFFERENTIAL/PLATELET
Abs Immature Granulocytes: 0.01 10*3/uL (ref 0.00–0.07)
Basophils Absolute: 0 10*3/uL (ref 0.0–0.1)
Basophils Relative: 1 %
Eosinophils Absolute: 0.3 10*3/uL (ref 0.0–0.5)
Eosinophils Relative: 5 %
HCT: 40.9 % (ref 36.0–46.0)
Hemoglobin: 13.6 g/dL (ref 12.0–15.0)
Immature Granulocytes: 0 %
Lymphocytes Relative: 11 %
Lymphs Abs: 0.6 10*3/uL — ABNORMAL LOW (ref 0.7–4.0)
MCH: 31.3 pg (ref 26.0–34.0)
MCHC: 33.3 g/dL (ref 30.0–36.0)
MCV: 94.2 fL (ref 80.0–100.0)
Monocytes Absolute: 0.4 10*3/uL (ref 0.1–1.0)
Monocytes Relative: 7 %
Neutro Abs: 4.1 10*3/uL (ref 1.7–7.7)
Neutrophils Relative %: 76 %
Platelets: 160 10*3/uL (ref 150–400)
RBC: 4.34 MIL/uL (ref 3.87–5.11)
RDW: 14.9 % (ref 11.5–15.5)
WBC: 5.5 10*3/uL (ref 4.0–10.5)
nRBC: 0 % (ref 0.0–0.2)

## 2020-10-03 LAB — COMPREHENSIVE METABOLIC PANEL
ALT: 9 U/L (ref 0–44)
AST: 19 U/L (ref 15–41)
Albumin: 4.3 g/dL (ref 3.5–5.0)
Alkaline Phosphatase: 167 U/L — ABNORMAL HIGH (ref 38–126)
Anion gap: 15 (ref 5–15)
BUN: 15 mg/dL (ref 8–23)
CO2: 24 mmol/L (ref 22–32)
Calcium: 7.8 mg/dL — ABNORMAL LOW (ref 8.9–10.3)
Chloride: 98 mmol/L (ref 98–111)
Creatinine, Ser: 1.21 mg/dL — ABNORMAL HIGH (ref 0.44–1.00)
GFR, Estimated: 49 mL/min — ABNORMAL LOW (ref >60)
Glucose, Bld: 89 mg/dL (ref 70–99)
Potassium: 3.8 mmol/L (ref 3.5–5.1)
Sodium: 137 mmol/L (ref 135–145)
Total Bilirubin: 1.2 mg/dL (ref 0.3–1.2)
Total Protein: 8.5 g/dL — ABNORMAL HIGH (ref 6.5–8.1)

## 2020-10-03 LAB — URINALYSIS, COMPLETE (UACMP) WITH MICROSCOPIC
Bilirubin Urine: NEGATIVE
Glucose, UA: NEGATIVE mg/dL
Ketones, ur: NEGATIVE mg/dL
Nitrite: POSITIVE — AB
Protein, ur: NEGATIVE mg/dL
Specific Gravity, Urine: 1.008 (ref 1.005–1.030)
WBC, UA: >50 WBC/hpf — ABNORMAL HIGH (ref 0–5)
pH: 5 (ref 5.0–8.0)

## 2020-10-03 LAB — HIV ANTIBODY (ROUTINE TESTING W REFLEX): HIV Screen 4th Generation wRfx: NONREACTIVE

## 2020-10-03 LAB — TROPONIN I (HIGH SENSITIVITY)
Troponin I (High Sensitivity): 31 ng/L — ABNORMAL HIGH (ref <18)
Troponin I (High Sensitivity): 31 ng/L — ABNORMAL HIGH (ref <18)

## 2020-10-03 LAB — LACTIC ACID, PLASMA: Lactic Acid, Venous: 1.4 mmol/L (ref 0.5–1.9)

## 2020-10-03 LAB — COOXEMETRY PANEL: Carboxyhemoglobin: 12.6 % (ref 0.5–1.5)

## 2020-10-03 LAB — ETHANOL: Alcohol, Ethyl (B): <10 mg/dL (ref <10)

## 2020-10-03 LAB — PHENYTOIN LEVEL, TOTAL: Phenytoin Lvl: <2.5 ug/mL — ABNORMAL LOW (ref 10.0–20.0)

## 2020-10-03 IMAGING — CR DG SHOULDER 2+V*R*
1 series · 3 of 3 positions shown · non-contrast
Comparison: None.

CLINICAL DATA: Fall.

EXAM:
RIGHT SHOULDER - 2+ VIEW

[Series 1: dg shoulder right · 0.14mm/px · 3 of 3 slices shown]
[im 1/3]
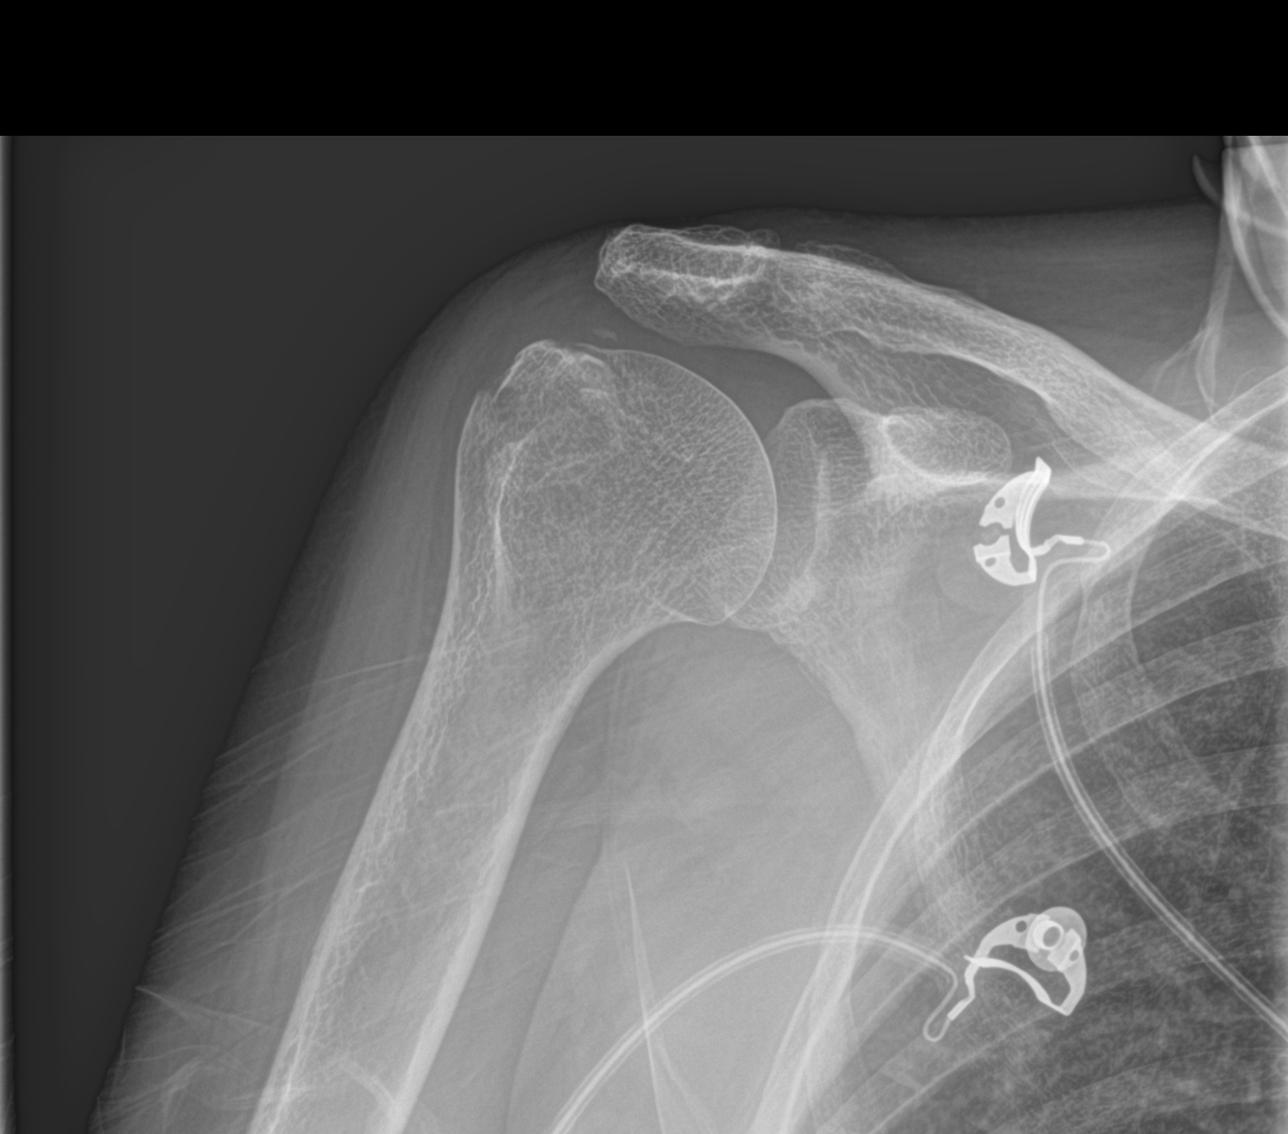
[im 2/3]
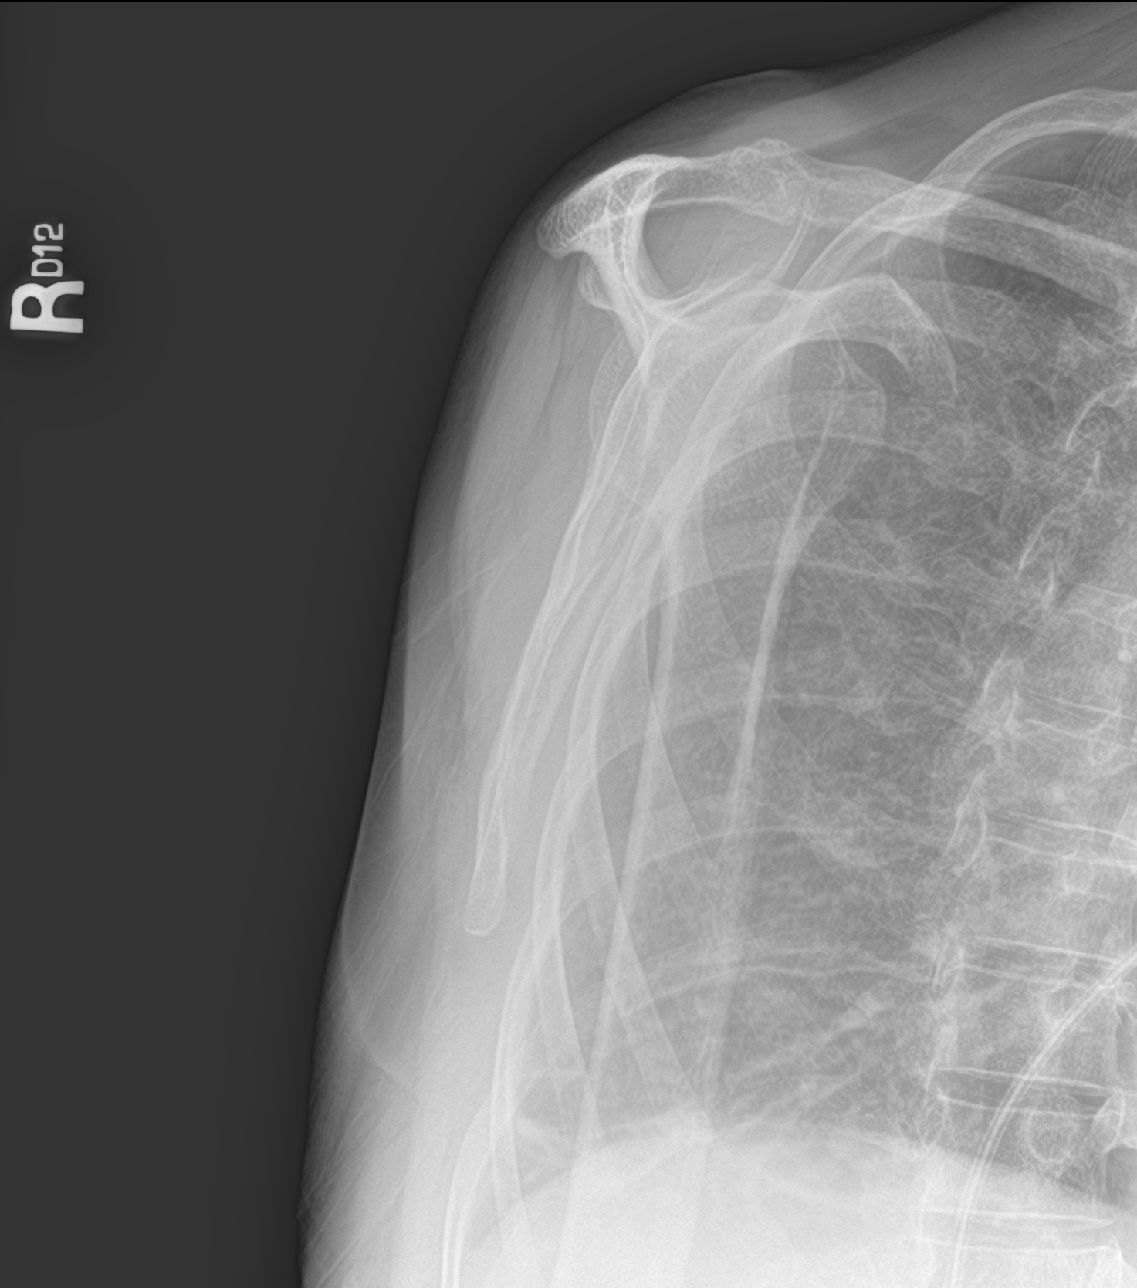
[im 3/3]
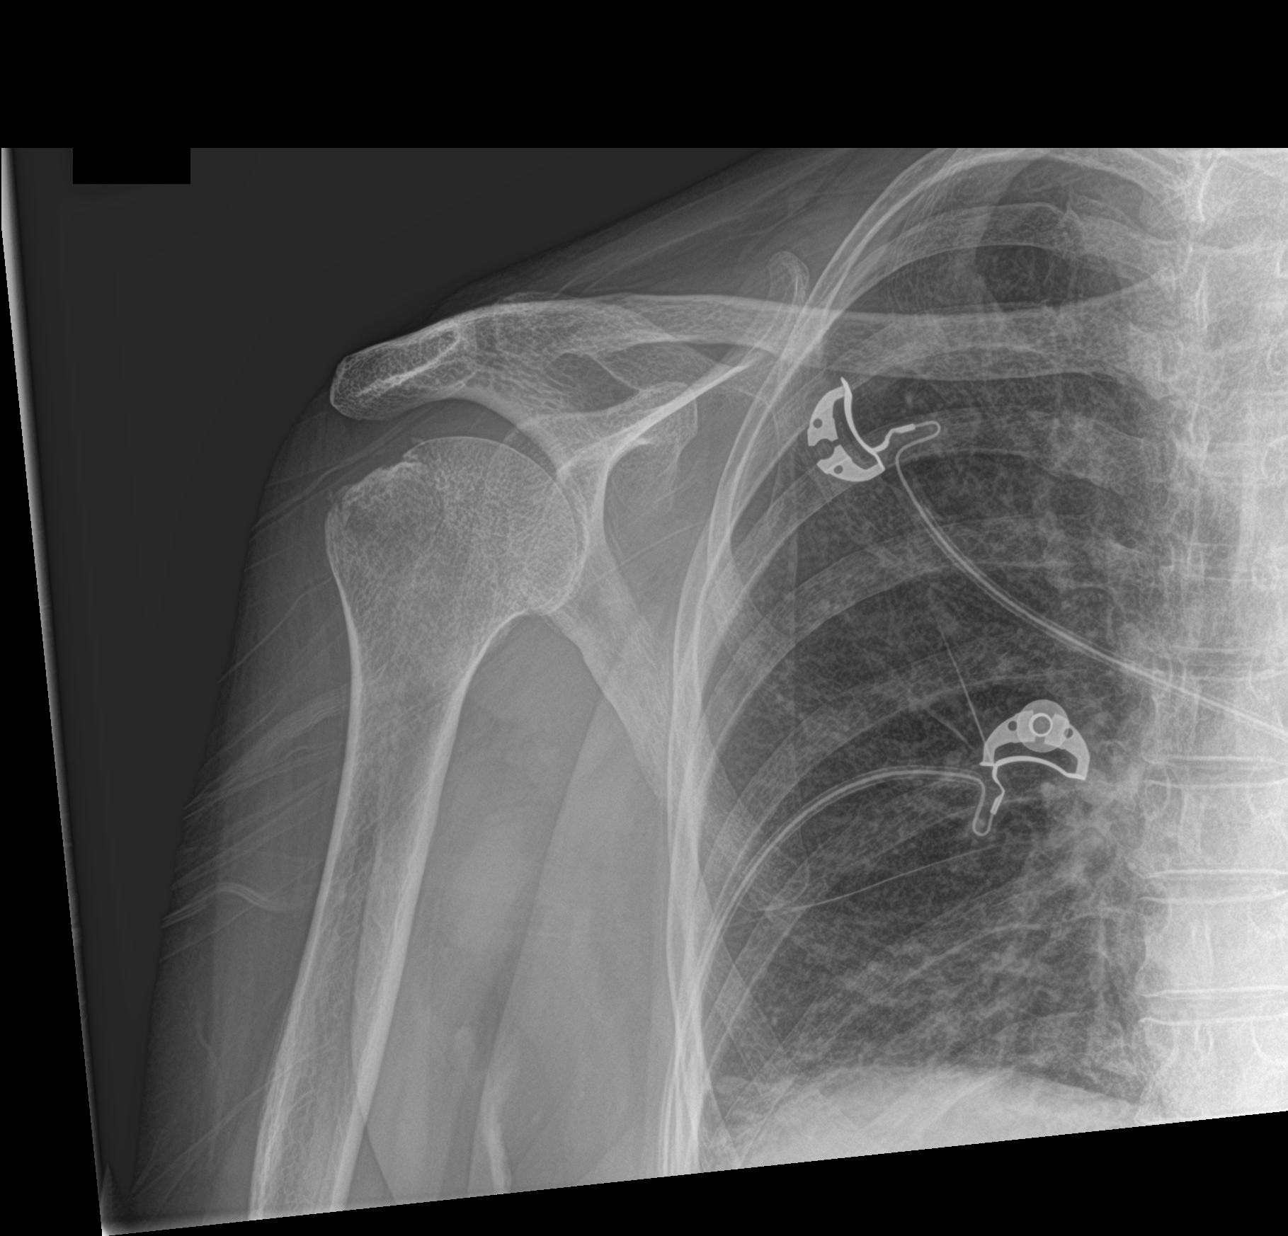

[3 of 3 positions shown; findings below may reference images not displayed]

FINDINGS: Acute minimally impacted fracture of the greater tuberosity. No
dislocation. Mild degenerative changes of the acromioclavicular
joint. Glenohumeral joint space is preserved. Soft tissues are
unremarkable.
IMPRESSION: 1. Acute minimally impacted fracture of the greater tuberosity.

## 2020-10-03 IMAGING — CR DG CHEST 2V
1 series · 2 of 2 positions shown · non-contrast
Comparison: [DATE]

CLINICAL DATA: Fall, weakness in arms and legs, stroke symptoms;
smoker, hypertension, coronary artery disease

EXAM:
CHEST - 2 VIEW

[Series 1: dg chest 2 view · 0.14mm/px · 2 of 2 slices shown]
[im 1/2]
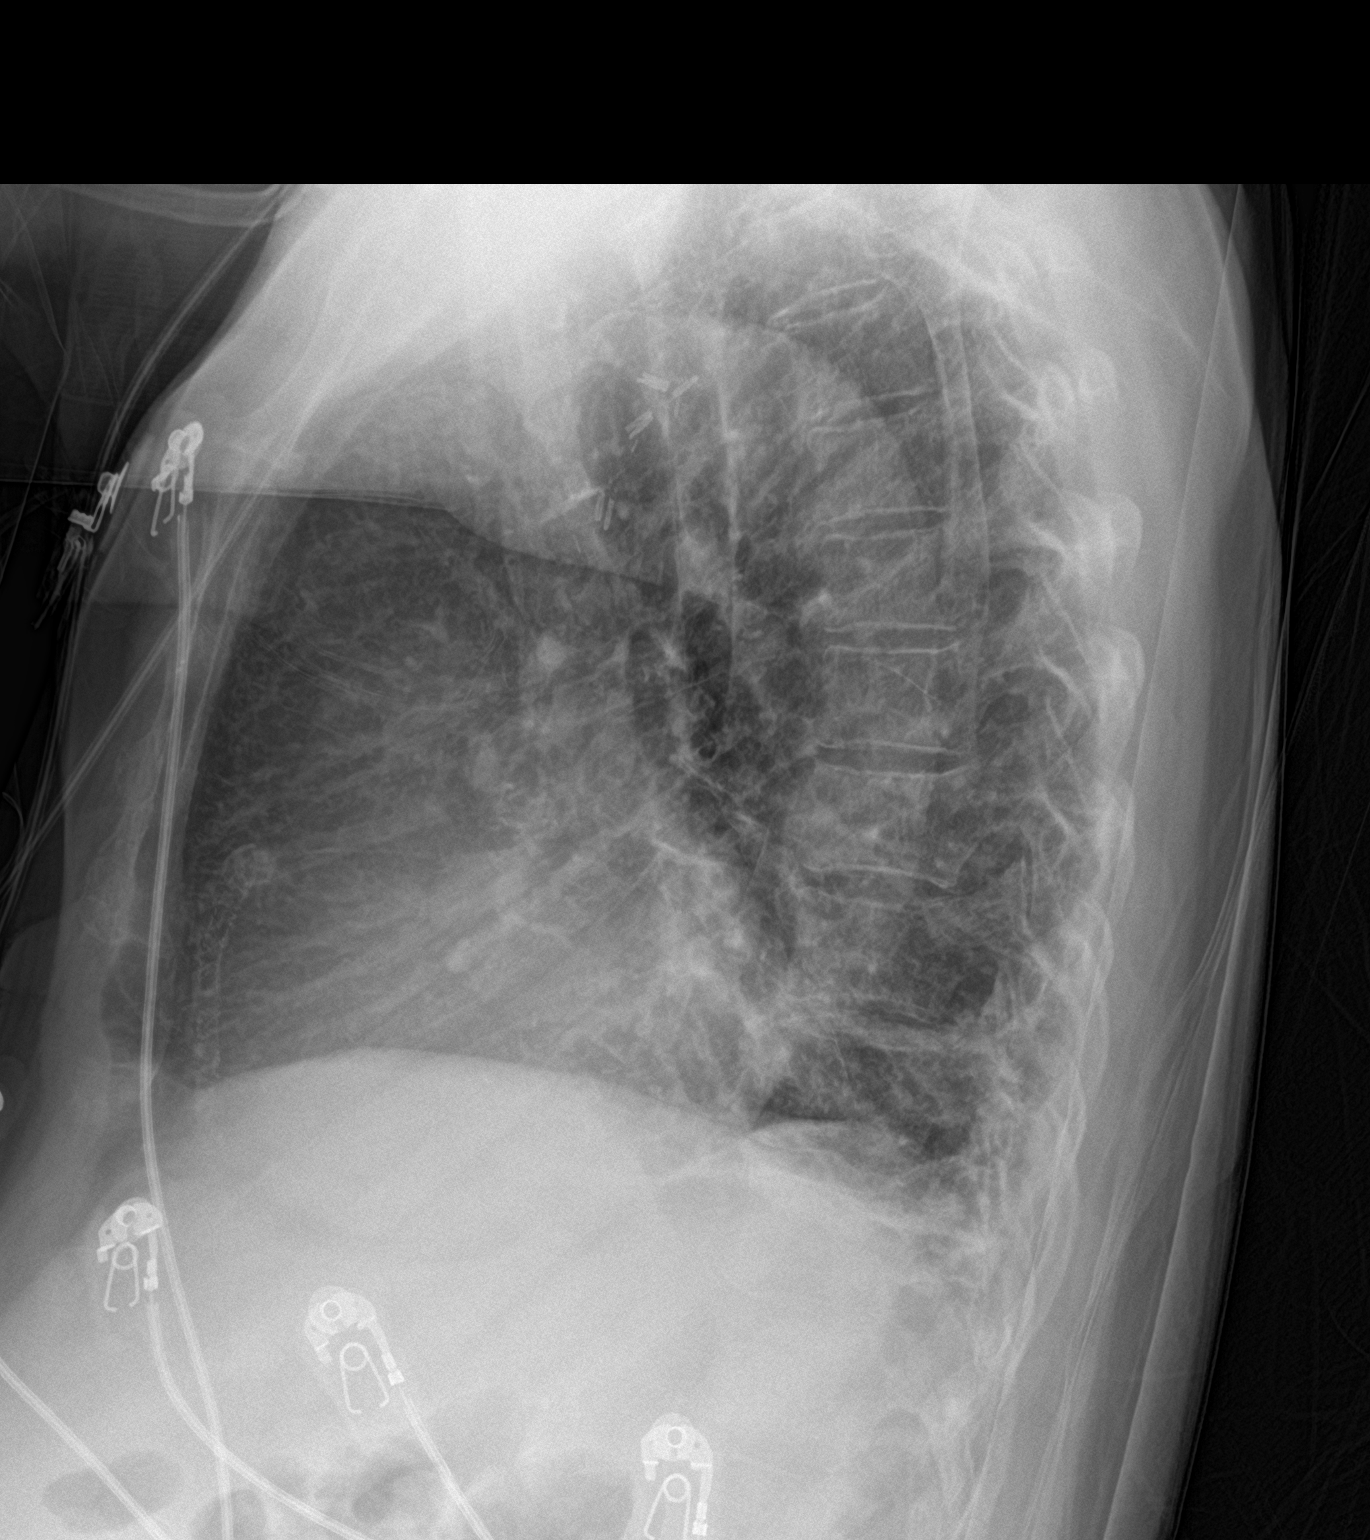
[im 2/2]
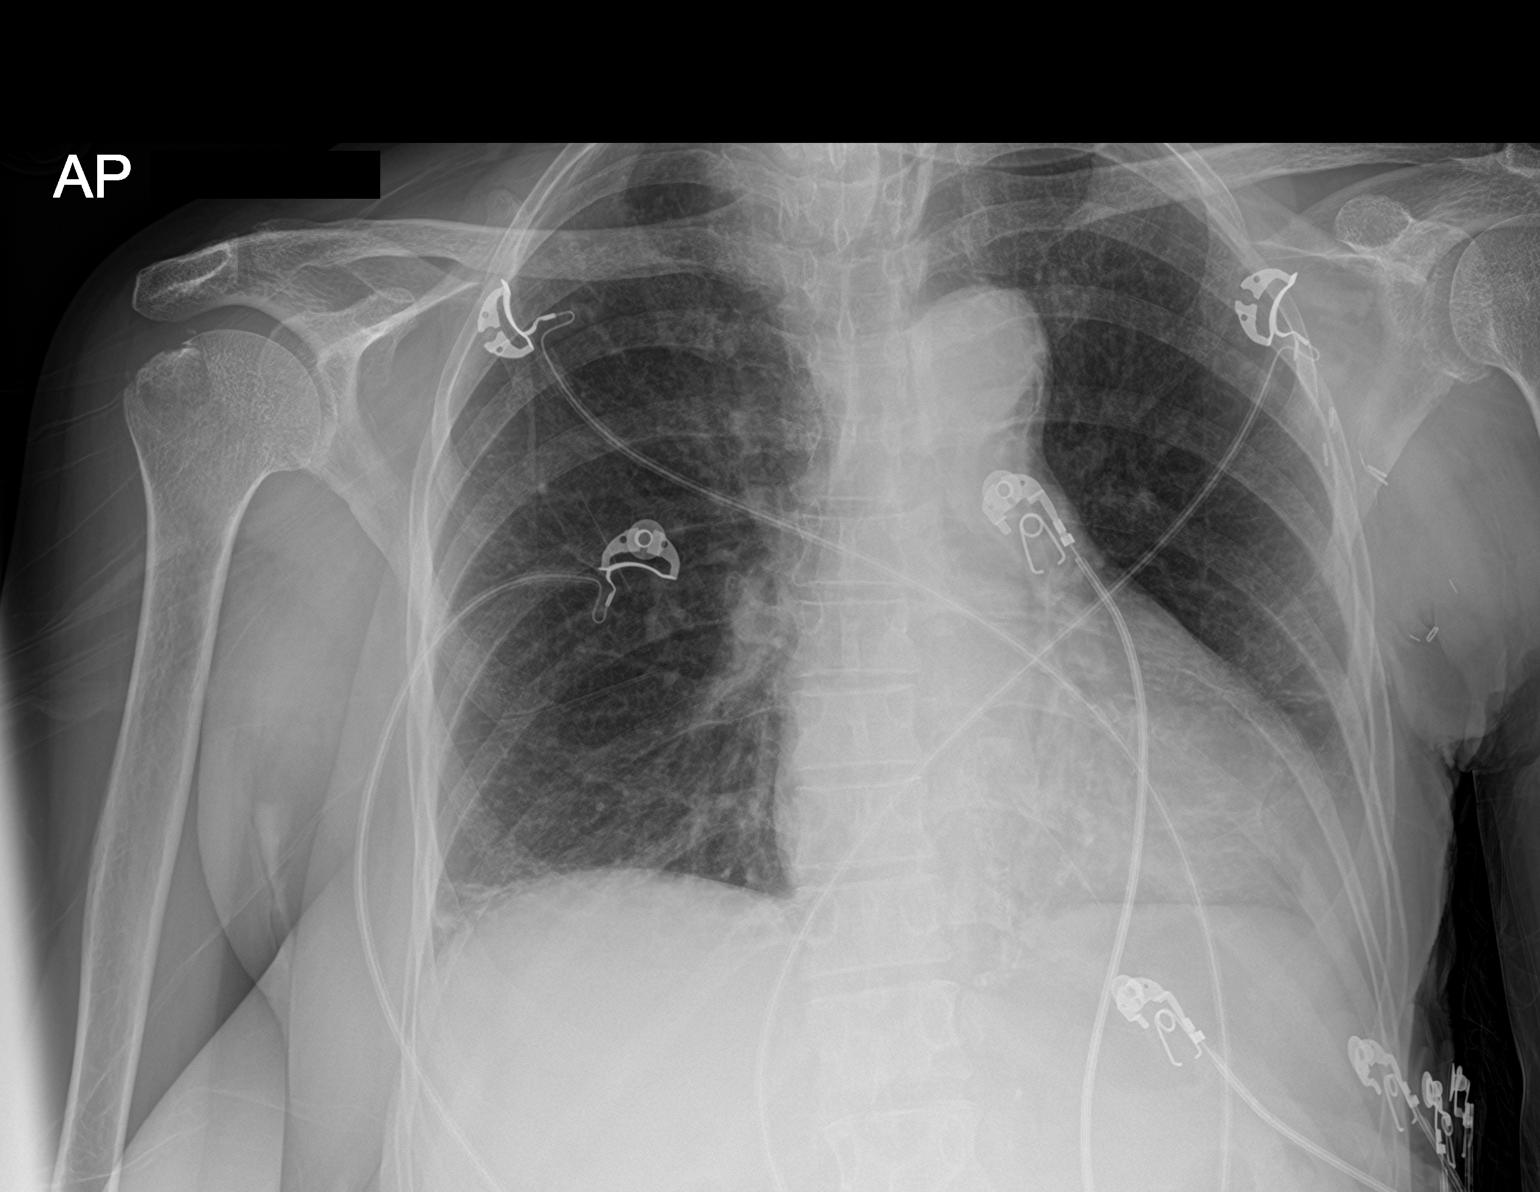

[2 of 2 positions shown; findings below may reference images not displayed]

FINDINGS: Upper normal heart size.

Mediastinal contours and pulmonary vascularity normal.

Atherosclerotic calcification aorta.

Emphysematous and bronchitic changes likely reflecting COPD.

Coronary arterial stents.

Minimal bibasilar atelectasis without infiltrate or pleural
effusion.

No pneumothorax or acute osseous findings.
IMPRESSION: COPD changes with minimal bibasilar atelectasis.

Aortic Atherosclerosis ([0C]-[0C]) and Emphysema ([0C]-[0C]).

## 2020-10-03 IMAGING — CT CT HEAD W/O CM
3 series · 16 of 46 positions shown, 19 images · non-contrast
Comparison: [DATE]

CLINICAL DATA: Weakness in arms and legs.

EXAM:
CT HEAD WITHOUT CONTRAST
TECHNIQUE: Contiguous axial images were obtained from the base of the skull
through the vertex without intravenous contrast.

[Series 2: head wo · axial · 0.41mm/px · z∈[+272,+392]mm · 10 of 29 slices shown, 13 images]
[im 3/29  brain]
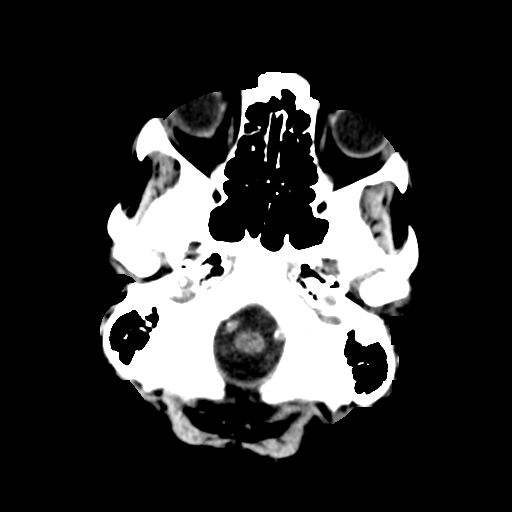
[im 3/29  bone]
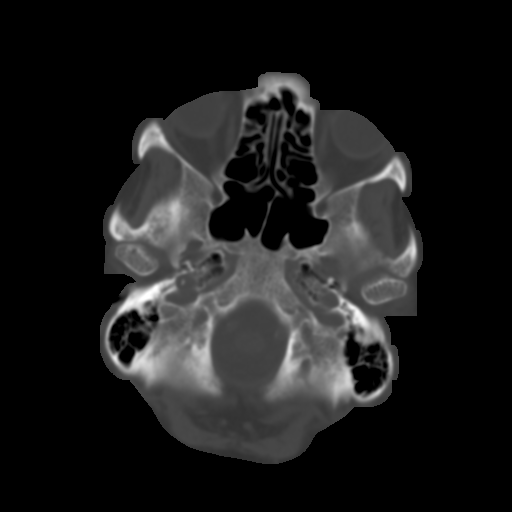
[im 6/29  brain]
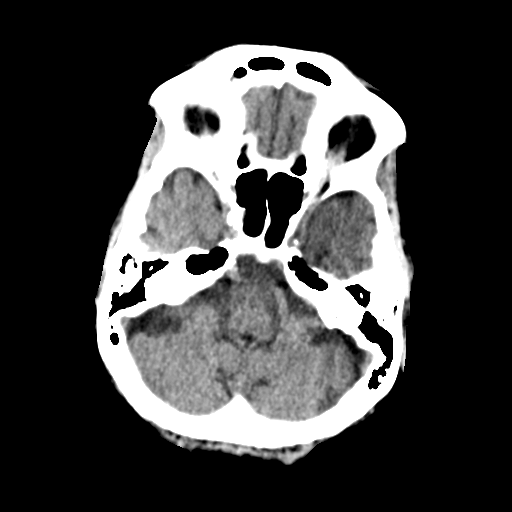
[im 8/29  brain]
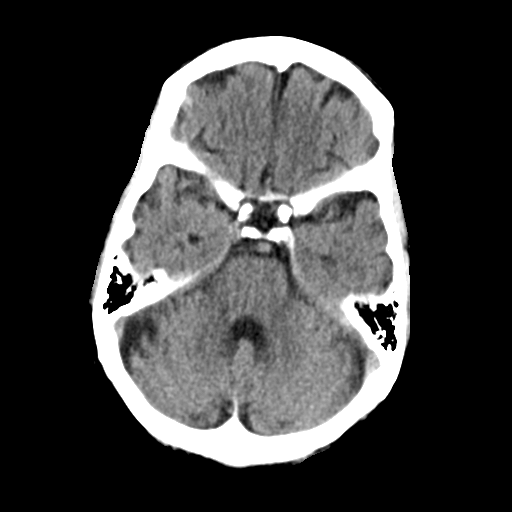
[im 11/29  brain]
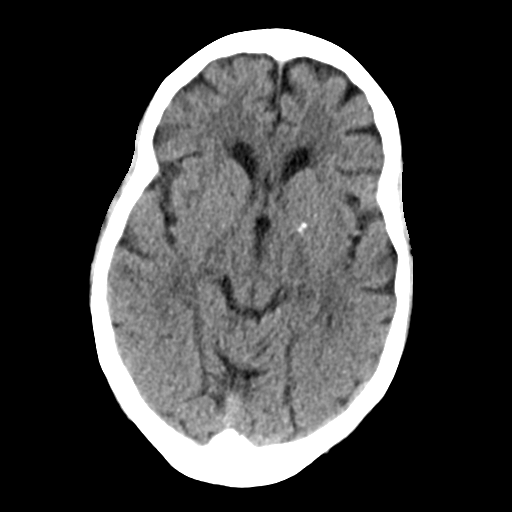
[im 14/29  brain]
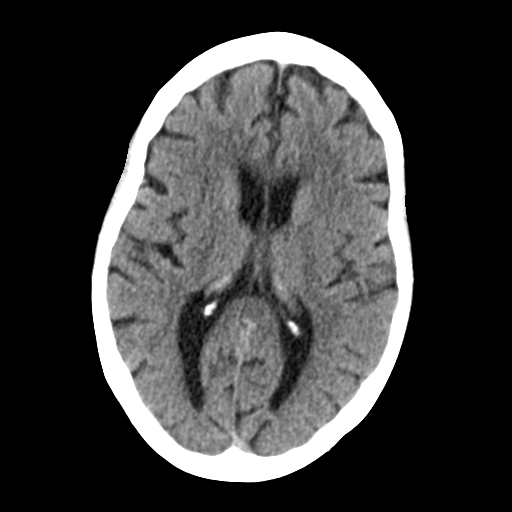
[im 14/29  bone]
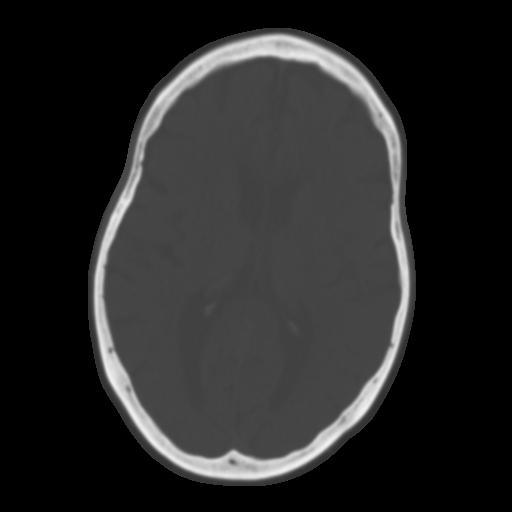
[im 16/29  brain]
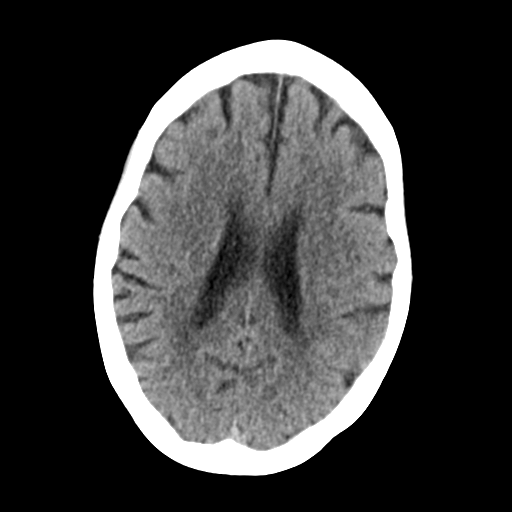
[im 19/29  brain]
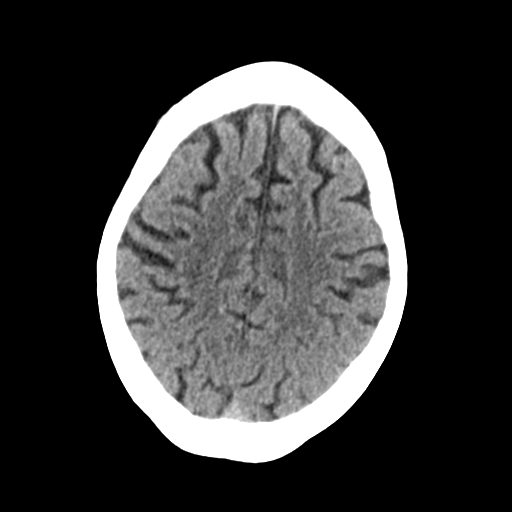
[im 22/29  brain]
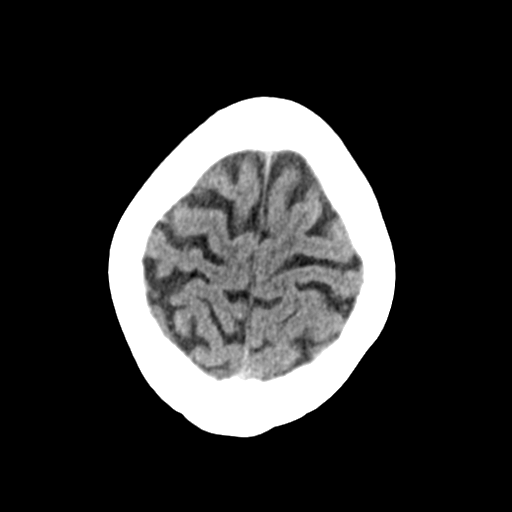
[im 24/29  brain]
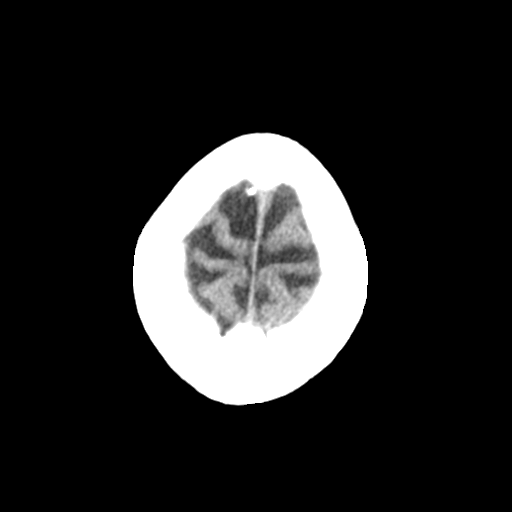
[im 24/29  bone]
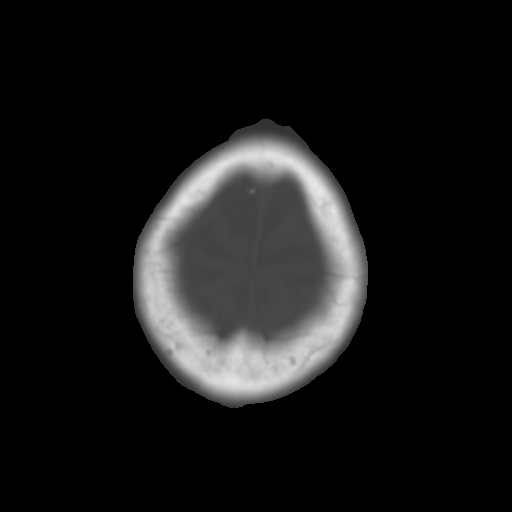
[im 27/29  brain]
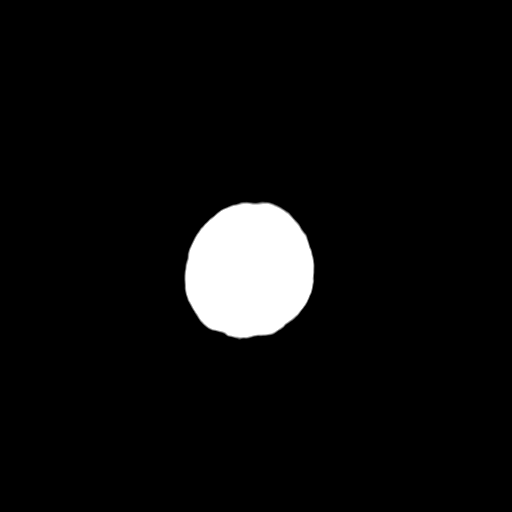

[Series 4: coronal soft tissue · coronal · 0.30mm/px · 3 of 65 slices shown]
[im 22/65  brain]
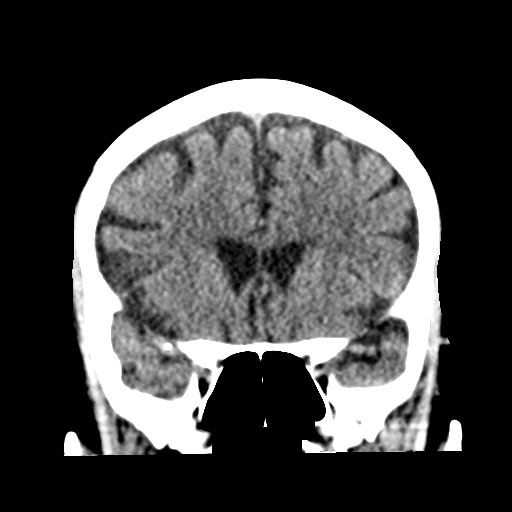
[im 29/65  brain]
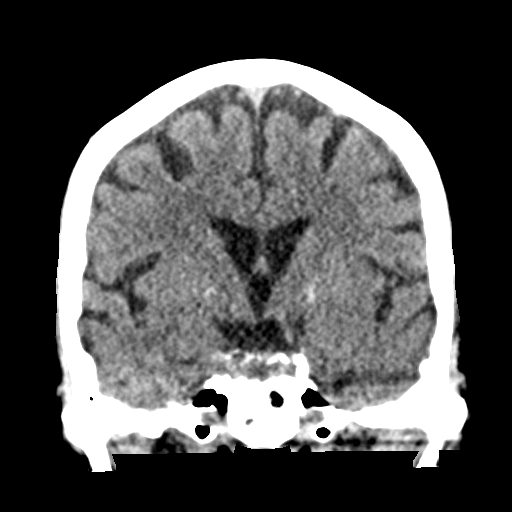
[im 36/65  brain]
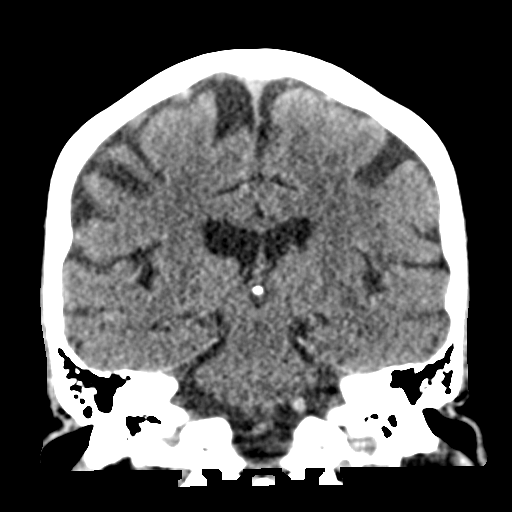

[Series 5: sagittal soft tissue · sagittal · 0.30mm/px · 3 of 46 slices shown]
[im 16/46  brain]
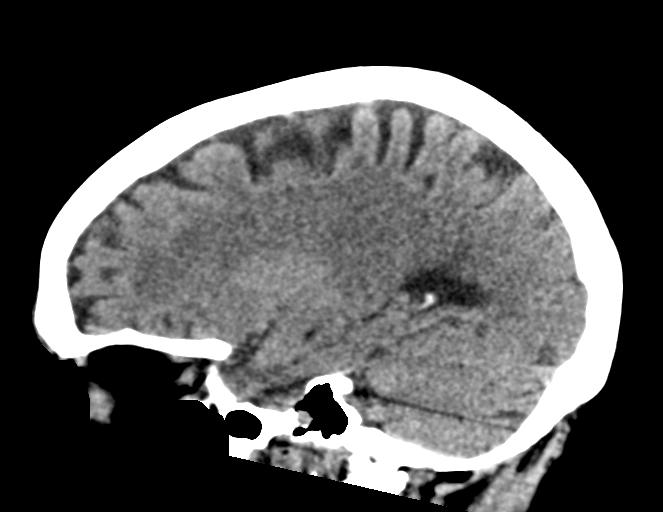
[im 23/46  brain]
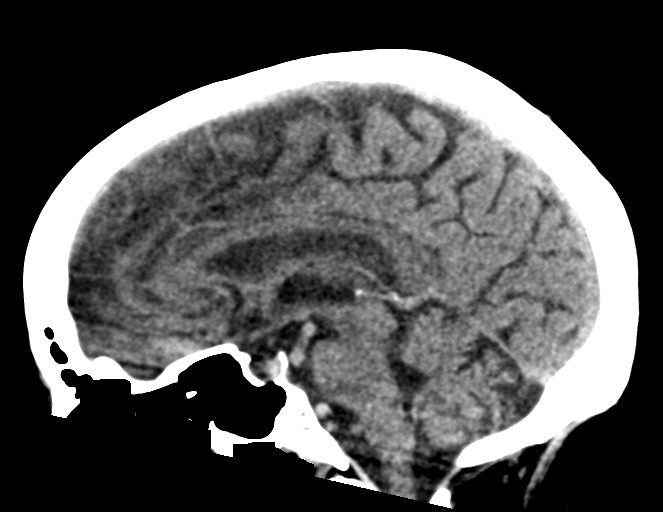
[im 31/46  brain]
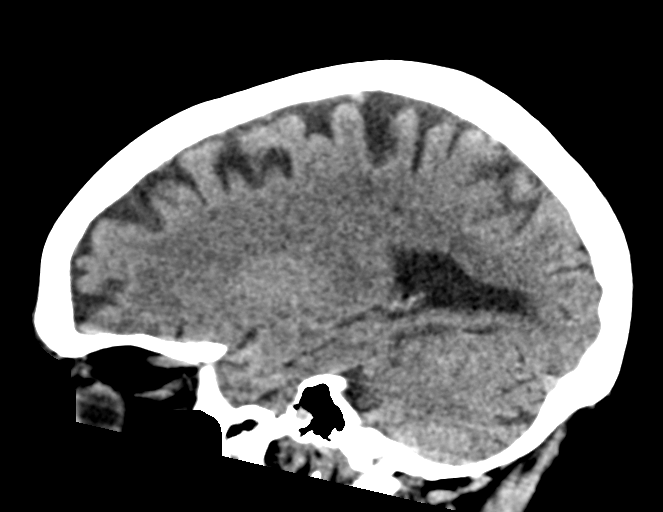

[16 of 46 positions shown; findings below may reference images not displayed]

FINDINGS: Brain: Cerebral and cerebellar atrophy. No mass lesion, hemorrhage,
hydrocephalus, acute infarct, intra-axial, or extra-axial fluid
collection.

Vascular: Intracranial atherosclerosis.

Skull: No significant soft tissue swelling.  No skull fracture.

Sinuses/Orbits: Normal imaged portions of the orbits and globes.
Clear mastoid air cells. Aerated petrous apices.

Other: None.
IMPRESSION: Cerebral/cerebellar atrophy.  No acute findings.

## 2020-10-03 MED ORDER — ONDANSETRON HCL 4 MG PO TABS: 4.0000 mg | ORAL_TABLET | Freq: Four times a day (QID) | ORAL | Status: DC | PRN

## 2020-10-03 MED ORDER — SPIRONOLACTONE 25 MG PO TABS
12.5000 mg | ORAL_TABLET | Freq: Every day | ORAL | Status: DC
Start: 2020-10-03 — End: 2020-10-05
  Administered 2020-10-04 – 2020-10-05 (×2): 12.5 mg via ORAL
  Filled 2020-10-03 (×2): qty 0.5
  Filled 2020-10-03: qty 1
  Filled 2020-10-03: qty 0.5
  Filled 2020-10-03: qty 1

## 2020-10-03 MED ORDER — LEVETIRACETAM 500 MG PO TABS: 1000.0000 mg | ORAL_TABLET | Freq: Every day | ORAL | Status: DC

## 2020-10-03 MED ORDER — ACETAMINOPHEN 325 MG PO TABS
650.0000 mg | ORAL_TABLET | Freq: Four times a day (QID) | ORAL | Status: DC | PRN
  Administered 2020-10-04: 650 mg via ORAL
  Filled 2020-10-03 (×2): qty 2

## 2020-10-03 MED ORDER — PNEUMOCOCCAL VAC POLYVALENT 25 MCG/0.5ML IJ INJ
0.5000 mL | INJECTION | INTRAMUSCULAR | Status: AC
  Administered 2020-10-05: 0.5 mL via INTRAMUSCULAR
  Filled 2020-10-03: qty 0.5

## 2020-10-03 MED ORDER — NICOTINE 7 MG/24HR TD PT24
7.0000 mg | MEDICATED_PATCH | Freq: Every day | TRANSDERMAL | Status: DC
  Filled 2020-10-03 (×3): qty 1

## 2020-10-03 MED ORDER — CLONAZEPAM 1 MG PO TABS
1.0000 mg | ORAL_TABLET | Freq: Two times a day (BID) | ORAL | Status: DC
Start: 2020-10-03 — End: 2020-10-05
  Administered 2020-10-03 – 2020-10-05 (×4): 1 mg via ORAL
  Filled 2020-10-03: qty 1
  Filled 2020-10-03: qty 2
  Filled 2020-10-03 (×2): qty 1

## 2020-10-03 MED ORDER — ATORVASTATIN CALCIUM 20 MG PO TABS
40.0000 mg | ORAL_TABLET | Freq: Every day | ORAL | Status: DC
  Administered 2020-10-03 – 2020-10-05 (×3): 40 mg via ORAL
  Filled 2020-10-03 (×3): qty 2

## 2020-10-03 MED ORDER — AMLODIPINE BESYLATE 5 MG PO TABS: 10.0000 mg | ORAL_TABLET | Freq: Every day | ORAL | Status: DC

## 2020-10-03 MED ORDER — LACOSAMIDE 50 MG PO TABS
150.0000 mg | ORAL_TABLET | Freq: Two times a day (BID) | ORAL | Status: DC
  Administered 2020-10-03 – 2020-10-05 (×4): 150 mg via ORAL
  Filled 2020-10-03 (×5): qty 3

## 2020-10-03 MED ORDER — LEVETIRACETAM 500 MG PO TABS: 500.0000 mg | ORAL_TABLET | Freq: Two times a day (BID) | ORAL | Status: DC

## 2020-10-03 MED ORDER — LOSARTAN POTASSIUM 50 MG PO TABS
100.0000 mg | ORAL_TABLET | Freq: Every day | ORAL | Status: DC
Start: 2020-10-03 — End: 2020-10-05
  Administered 2020-10-04 – 2020-10-05 (×2): 100 mg via ORAL
  Filled 2020-10-03 (×3): qty 2

## 2020-10-03 MED ORDER — GABAPENTIN 300 MG PO CAPS
300.0000 mg | ORAL_CAPSULE | Freq: Two times a day (BID) | ORAL | Status: DC
  Administered 2020-10-03 – 2020-10-05 (×4): 300 mg via ORAL
  Filled 2020-10-03 (×4): qty 1

## 2020-10-03 MED ORDER — ONDANSETRON HCL 4 MG/2ML IJ SOLN: 4.0000 mg | Freq: Four times a day (QID) | INTRAMUSCULAR | Status: DC | PRN

## 2020-10-03 MED ORDER — EMPAGLIFLOZIN 10 MG PO TABS
10.0000 mg | ORAL_TABLET | Freq: Every day | ORAL | Status: DC
  Administered 2020-10-03 – 2020-10-05 (×3): 10 mg via ORAL
  Filled 2020-10-03 (×4): qty 1

## 2020-10-03 MED ORDER — FUROSEMIDE 40 MG PO TABS: 40.0000 mg | ORAL_TABLET | Freq: Every day | ORAL | Status: DC

## 2020-10-03 MED ORDER — HYDRALAZINE HCL 10 MG PO TABS
10.0000 mg | ORAL_TABLET | Freq: Every day | ORAL | Status: DC
  Filled 2020-10-03: qty 1

## 2020-10-03 MED ORDER — ACETAMINOPHEN 650 MG RE SUPP: 650.0000 mg | Freq: Four times a day (QID) | RECTAL | Status: DC | PRN

## 2020-10-03 MED ORDER — LEVETIRACETAM 750 MG PO TABS
750.0000 mg | ORAL_TABLET | Freq: Every day | ORAL | Status: DC
  Administered 2020-10-03 – 2020-10-04 (×2): 750 mg via ORAL
  Filled 2020-10-03 (×4): qty 1

## 2020-10-03 MED ORDER — LEVETIRACETAM 500 MG PO TABS
500.0000 mg | ORAL_TABLET | Freq: Two times a day (BID) | ORAL | Status: DC
Start: 2020-10-04 — End: 2020-10-05
  Administered 2020-10-04 – 2020-10-05 (×3): 500 mg via ORAL
  Filled 2020-10-03 (×4): qty 1

## 2020-10-03 MED ORDER — MIRTAZAPINE 15 MG PO TBDP
15.0000 mg | ORAL_TABLET | Freq: Every day | ORAL | Status: DC
  Administered 2020-10-03 – 2020-10-04 (×2): 15 mg via ORAL
  Filled 2020-10-03 (×4): qty 1

## 2020-10-03 MED ORDER — INFLUENZA VAC A&B SA ADJ QUAD 0.5 ML IM PRSY
0.5000 mL | PREFILLED_SYRINGE | INTRAMUSCULAR | Status: DC
  Filled 2020-10-03: qty 0.5

## 2020-10-03 MED ORDER — PHENYTOIN SODIUM EXTENDED 100 MG PO CAPS: 200.0000 mg | ORAL_CAPSULE | Freq: Every day | ORAL | Status: DC

## 2020-10-03 MED ORDER — CARVEDILOL 12.5 MG PO TABS
12.5000 mg | ORAL_TABLET | Freq: Two times a day (BID) | ORAL | Status: DC
  Administered 2020-10-04 – 2020-10-05 (×2): 12.5 mg via ORAL
  Filled 2020-10-03: qty 1
  Filled 2020-10-03: qty 2
  Filled 2020-10-03 (×2): qty 1

## 2020-10-03 MED ORDER — HYDROCHLOROTHIAZIDE 12.5 MG PO CAPS
12.5000 mg | ORAL_CAPSULE | Freq: Two times a day (BID) | ORAL | Status: DC
  Administered 2020-10-03: 12.5 mg via ORAL
  Filled 2020-10-03: qty 1

## 2020-10-03 MED ORDER — LEVETIRACETAM 500 MG PO TABS: 500.0000 mg | ORAL_TABLET | Freq: Every day | ORAL | Status: DC

## 2020-10-03 MED ORDER — ENOXAPARIN SODIUM 40 MG/0.4ML ~~LOC~~ SOLN
40.0000 mg | SUBCUTANEOUS | Status: DC
  Administered 2020-10-03 – 2020-10-04 (×2): 40 mg via SUBCUTANEOUS
  Filled 2020-10-03 (×2): qty 0.4

## 2020-10-03 NOTE — ED Notes (Signed)
Pt placed on NRB per Dr Charna Archer. RR even and unlabored, NAd noted

## 2020-10-03 NOTE — Plan of Care (Signed)

## 2020-10-03 NOTE — ED Notes (Signed)
Pt provided milk as requested

## 2020-10-03 NOTE — ED Notes (Signed)
Pt placed on purewick 

## 2020-10-03 NOTE — ED Notes (Signed)
Seizure pads in place

## 2020-10-03 NOTE — ED Notes (Signed)
Pt repeatedly stating she needs to get up and use the restroom, this RN explained to pt that d/t nature of her visit and weakness in all extremities, it is not safe to get up to use restroom at this time. Explained purewick to pt and repeatedly explained she can urinate at this time

## 2020-10-03 NOTE — H&P (Addendum)
History and Physical    Anne Phillips [id number] DOB: 1951/08/02 DOA: 10/03/2020  PCP: Pcp, No   Patient coming from: Home  I have personally briefly reviewed patient's old medical records in [place]  Chief Complaint: Weakness  HPI: Anne Phillips is a 70 y.o. female with medical history significant for seizure disorder, coronary artery disease and hypertension, nicotine dependence who presented to the ED via EMS for evaluation after she fell.  Patient states that she had taken her blood pressure medicine as well as her antiepileptics and not too long after that felt very weak in both her arms and legs prior to falling.  EMS was called due to concerns for a stroke but upon their arrival they reported a strong smell of gas in home from the heat unit.  Upon arrival to the ER patient was noted to be disoriented and an arterial blood gas was done which showed elevated carboxy hemoglobin levels. Patient remains weak but is oriented to person, place and time. She denies having any chest pain, no shortness of breath, no nausea, no vomiting, no dizziness, no lightheadedness, no abdominal pain, no headache, no urinary frequency, no nocturia, no dysuria, no changes in her bowel habits. Labs show carboxyhemoglobin of 12.6, sodium 137, potassium 3.8, chloride 98, bicarb 24, BUN 15, creatinine 1.21, calcium 7.8, alkaline phosphatase 167, albumin 4.3, AST 19, ALT 9, total protein 8.5, total bilirubin 1.2, troponin 31, white count 5.5, hemoglobin 13.6, hematocrit 40.9, MCV 94.2, RDW 14.9, platelet count 160 CT scan of the head without contrast shows cerebral/cerebellar atrophy.  No acute findings Chest x-ray reviewed by me shows hyperinflated lung fields. Right shoulder x-ray shows acute minimally impacted fracture of the greater tuberosity. Twelve-lead EKG reviewed by me shows sinus rhythm with PVCs   ED Course: Patient is a 70 year old African-American female who was brought into the ER by  EMS for evaluation following a fall at home.  Patient states that she had generalized weakness and fell and denies having any loss of consciousness.  She does not think she had a seizure episode as well so she called EMS herself.  When EMS arrived they noted a strong smell of gas in the home.  Patient was noted to have an elevated carboxyhemoglobin level and was placed on a 100% FIO2 via a nonrebreather mask nonrebreather mask.  She will be referred to observation status for further evaluation.  Review of Systems: As per HPI otherwise all systems reviewed and negative.    Past Medical History:  Diagnosis Date  . Coronary artery disease   . Hyperlipidemia   . Hypertension   . Seizures ([place])   . Thyroid disease     History reviewed. No pertinent surgical history.   reports that she has been smoking cigarettes. She has been smoking about 1.00 pack per day. She has never used smokeless tobacco. She reports that she does not drink alcohol. No history on file for drug use.  No Known Allergies  Family History  Problem Relation Age of Onset  . Hypertension Other      Prior to Admission medications   Medication Sig Start Date End Date Taking? Authorizing Provider  amLODipine (NORVASC) 10 MG tablet Take 10 mg by mouth daily.   Yes [provider]  atorvastatin (LIPITOR) 40 MG tablet Take 40 mg by mouth daily.   Yes [provider]  furosemide (LASIX) 40 MG tablet Take 40 mg by mouth daily.   Yes [provider]  losartan (COZAAR) 100 MG tablet Take 100 mg by mouth daily.   Yes [provider]  spironolactone (ALDACTONE) 25 MG tablet Take 12.5 mg by mouth daily.   Yes [provider]  clonazePAM (KLONOPIN) 1 MG tablet Take 1 tablet (1 mg total) by mouth 2 (two) times daily. 08/28/15   Gladstone Lighter, MD  gabapentin (NEURONTIN) 300 MG capsule Take 1 capsule (300 mg total) by mouth 3 (three) times daily. Patient taking differently: Take 300 mg by  mouth 2 (two) times daily. 08/28/15   Gladstone Lighter, MD  levETIRAcetam (KEPPRA) 500 MG tablet Take 750 mg by mouth 3 (three) times daily. Take 1 and 1/2 tablets three times daily    [provider]    Physical Exam: Vitals:   10/03/20 1123 10/03/20 1200 10/03/20 1330 10/03/20 1345  BP: 137/69 126/81 134/89   Pulse: 82 74 68 65  Resp: 19 17 15 17   Temp: 97.9 F (36.6 C)     TempSrc: Oral     SpO2: 94% 100% 100% 100%  Weight:      Height:         Vitals:   10/03/20 1123 10/03/20 1200 10/03/20 1330 10/03/20 1345  BP: 137/69 126/81 134/89   Pulse: 82 74 68 65  Resp: 19 17 15 17   Temp: 97.9 F (36.6 C)     TempSrc: Oral     SpO2: 94% 100% 100% 100%  Weight:      Height:        Constitutional: NAD, alert and oriented x 3.  Thin and frail Eyes: PERRL, lids and conjunctivae pallor ENMT: Mucous membranes are moist.  Neck: normal, supple, no masses, no thyromegaly Respiratory: clear to auscultation bilaterally, no wheezing, no crackles. Normal respiratory effort. No accessory muscle use.  Cardiovascular: Regular rate and rhythm,no murmurs / rubs / gallops. No extremity edema. 2+ pedal pulses. No carotid bruits.  Abdomen: no tenderness, no masses palpated. No hepatosplenomegaly. Bowel sounds positive.  Musculoskeletal: no clubbing / cyanosis.  Sling over right shoulder Skin: no rashes, lesions, ulcers.  Left mastectomy Neurologic: No gross focal neurologic deficit. Psychiatric: Normal mood and affect.   Labs on Admission: I have personally reviewed following labs and imaging studies  CBC: Recent Labs  Lab 10/03/20 1115  WBC 5.5  NEUTROABS 4.1  HGB 13.6  HCT 40.9  MCV 94.2  PLT 916   Basic Metabolic Panel: Recent Labs  Lab 10/03/20 1115  NA 137  K 3.8  CL 98  CO2 24  GLUCOSE 89  BUN 15  CREATININE 1.21*  CALCIUM 7.8*   GFR: Estimated Creatinine Clearance: 35.2 mL/min (A) (by C-G formula based on SCr of 1.21 mg/dL (H)). Liver Function  Tests: Recent Labs  Lab 10/03/20 1115  AST 19  ALT 9  ALKPHOS 167*  BILITOT 1.2  PROT 8.5*  ALBUMIN 4.3   No results for input(s): LIPASE, AMYLASE in the last 168 hours. No results for input(s): AMMONIA in the last 168 hours. Coagulation Profile: No results for input(s): INR, PROTIME in the last 168 hours. Cardiac Enzymes: No results for input(s): CKTOTAL, CKMB, CKMBINDEX, TROPONINI in the last 168 hours. BNP (last 3 results) No results for input(s): PROBNP in the last 8760 hours. HbA1C: No results for input(s): HGBA1C in the last 72 hours. CBG: No results for input(s): GLUCAP in the last 168 hours. Lipid Profile: No results for input(s): CHOL, HDL, LDLCALC, TRIG, CHOLHDL, LDLDIRECT in the last 72 hours. Thyroid Function Tests: No results  for input(s): TSH, T4TOTAL, FREET4, T3FREE, THYROIDAB in the last 72 hours. Anemia Panel: No results for input(s): VITAMINB12, FOLATE, FERRITIN, TIBC, IRON, RETICCTPCT in the last 72 hours. Urine analysis:    Component Value Date/Time   COLORURINE YELLOW (A) 08/26/2015 1602   APPEARANCEUR CLEAR (A) 08/26/2015 1602   LABSPEC 1.009 08/26/2015 1602   PHURINE 7.0 08/26/2015 1602   GLUCOSEU NEGATIVE 08/26/2015 1602   HGBUR 2+ (A) 08/26/2015 1602   BILIRUBINUR NEGATIVE 08/26/2015 1602   KETONESUR NEGATIVE 08/26/2015 1602   PROTEINUR NEGATIVE 08/26/2015 1602   NITRITE NEGATIVE 08/26/2015 1602   LEUKOCYTESUR 2+ (A) 08/26/2015 1602    Radiological Exams on Admission: DG Chest 2 View  Result Date: 10/03/2020 CLINICAL DATA:  Fall, weakness in arms and legs, stroke symptoms; smoker, hypertension, coronary artery disease EXAM: CHEST - 2 VIEW COMPARISON:  08/21/2015 FINDINGS: Upper normal heart size. Mediastinal contours and pulmonary vascularity normal. Atherosclerotic calcification aorta. Emphysematous and bronchitic changes likely reflecting COPD. Coronary arterial stents. Minimal bibasilar atelectasis without infiltrate or pleural effusion. No  pneumothorax or acute osseous findings. IMPRESSION: COPD changes with minimal bibasilar atelectasis. Aortic Atherosclerosis (ICD10-I70.0) and Emphysema (ICD10-J43.9). Electronically Signed   By: Lavonia Dana M.D.   On: 10/03/2020 12:54   DG Shoulder Right  Result Date: 10/03/2020 CLINICAL DATA:  Fall. EXAM: RIGHT SHOULDER - 2+ VIEW COMPARISON:  None. FINDINGS: Acute minimally impacted fracture of the greater tuberosity. No dislocation. Mild degenerative changes of the acromioclavicular joint. Glenohumeral joint space is preserved. Soft tissues are unremarkable. IMPRESSION: 1. Acute minimally impacted fracture of the greater tuberosity. Electronically Signed   By: Titus Dubin M.D.   On: 10/03/2020 12:48   CT Head Wo Contrast  Result Date: 10/03/2020 CLINICAL DATA:  Weakness in arms and legs. EXAM: CT HEAD WITHOUT CONTRAST TECHNIQUE: Contiguous axial images were obtained from the base of the skull through the vertex without intravenous contrast. COMPARISON:  08/20/2015 FINDINGS: Brain: Cerebral and cerebellar atrophy. No mass lesion, hemorrhage, hydrocephalus, acute infarct, intra-axial, or extra-axial fluid collection. Vascular: Intracranial atherosclerosis. Skull: No significant soft tissue swelling.  No skull fracture. Sinuses/Orbits: Normal imaged portions of the orbits and globes. Clear mastoid air cells. Aerated petrous apices. Other: None. IMPRESSION: Cerebral/cerebellar atrophy.  No acute findings. Electronically Signed   By: Abigail Miyamoto M.D.   On: 10/03/2020 13:44    EKG: Independently reviewed.  Normal sinus rhythm with PVCs  Assessment/Plan Principal Problem:   Carbon monoxide poisoning Active Problems:   (HFpEF) heart failure with preserved ejection fraction ([place])   Depression   Fall at home, initial encounter   Seizures ([place])   Hypertension      Carbon monoxide poisoning Patient was brought into the ER for evaluation following a fall at home associated with generalized  weakness Per EMS there was a strong smell of gas in the home Patient's carboxyhemoglobin level came back at 12.6.  Patient is also a smoker She was placed on a nonrebreather mask Called and discussed with poison control who recommends to leave nonrebreather mask on for 4 hours. Continue neurochecks every 4 hours   Status post fall With acute minimally impacted fracture of the greater tuberosity. Right shoulder is immobilized in a sling Orthopedic consult was placed by ER physician and Dr. Mack Guise will see patient in the office in 2 weeks Place patient on fall precautions    Seizure disorder Continue Keppra and lacosamide Place patient on seizure precautions   Hypertension Continue hydrochlorothiazide   Depression Continue Remeron and clonazepam  Nicotine dependence Smoking cessation was discussed with patient  Place patient on a nicotine transdermal patch   Heart failure with preserved EF Stable and not acutely exacerbated Continue hydrochlorothiazide, carvedilol and losartan   DVT prophylaxis: Lovenox Code Status: Full code Family Communication: Greater than 50% of time was spent discussing plan of care with patient at the bedside.  She verbalizes understanding and agrees with the plan. Disposition Plan: Back to previous home environment Consults called: Physical therapy consult    Baldemar Dady MD [facility]     10/03/2020, 3:04 PM

## 2020-10-03 NOTE — ED Notes (Signed)
RT contacted to collect ABG.

## 2020-10-03 NOTE — ED Triage Notes (Addendum)
Pt to ED via [facility] from home, was initially called out for stroke symptoms after taking medications this morning. States she felt weak in both arms and legs EMS reports strong smell of gas in home from heat unit, fire responded.  No obvious neuro deficits at this time.  Alert and oriented at this time.

## 2020-10-03 NOTE — ED Notes (Signed)
Per Dr Francine Graven, take off NRB at 1600 today 4 hours after NRB being placed on

## 2020-10-03 NOTE — ED Provider Notes (Signed)
[facility] Emergency Department Provider Note   ____________________________________________   Event Date/Time   First MD Initiated Contact with Patient 10/03/20 1113     (approximate)  I have reviewed the triage vital signs and the nursing notes.   HISTORY  Chief Complaint Chemical Exposure and Weakness    HPI Anne Phillips is a 70 y.o. female with past medical history of hypertension, hyperlipidemia, seizures, and CAD who presents to the ED for weakness.  Patient reports that she started feeling globally weak this morning shortly after taking her medications.  She states she was feeling fine when she went to bed last night and when she woke up this morning.  She was so weak in her legs that she was unable to walk and also states that the weakness affected both of her arms.  She reports some blurry vision as well as slurred speech at home.  EMS was called, and when they arrived to her house reported a very strong smell of gas.  Patient states she did not notice any smell of gas, but she was recently told that her abdomen was in bad shape and needed to be fixed.  She denies any fevers, headache, nausea, vomiting, chest pain, or shortness of breath.        Past Medical History:  Diagnosis Date  . Coronary artery disease   . Hyperlipidemia   . Hypertension   . Seizures ([place])   . Thyroid disease     Patient Active Problem List   Diagnosis Date Noted  . Carbon monoxide poisoning 10/03/2020  . Fall at home, initial encounter 10/03/2020  . Seizures ([place])   . Hypertension   . Depression 08/27/2015  . Acute delirium 08/27/2015  . Depression, major, single episode, moderate ([place])   . (HFpEF) heart failure with preserved ejection fraction ([place])   . Disorientation   . Pyrexia   . Hypoxia   . Non-traumatic rhabdomyolysis   . Coronary artery disease involving native coronary artery of native heart without angina pectoris   . Sepsis ([place]) 08/20/2015   . NSTEMI (non-ST elevated myocardial infarction) ([place]) 08/20/2015  . Rhabdomyolysis 08/20/2015    History reviewed. No pertinent surgical history.  Prior to Admission medications   Medication Sig Start Date End Date Taking? Authorizing Provider  amLODipine (NORVASC) 10 MG tablet Take 10 mg by mouth daily.   Yes [provider]  atorvastatin (LIPITOR) 40 MG tablet Take 40 mg by mouth daily.   Yes [provider]  carvedilol (COREG) 12.5 MG tablet Take 12.5 mg by mouth 2 (two) times daily with a meal.   Yes [provider]  empagliflozin (JARDIANCE) 10 MG TABS tablet Take 10 mg by mouth daily.   Yes [provider]  furosemide (LASIX) 40 MG tablet Take 40 mg by mouth daily.   Yes [provider]  Lacosamide (VIMPAT) 150 MG TABS Take 150 mg by mouth in the morning and at bedtime.   Yes [provider]  losartan (COZAAR) 100 MG tablet Take 100 mg by mouth daily.   Yes [provider]  spironolactone (ALDACTONE) 25 MG tablet Take 12.5 mg by mouth daily.   Yes [provider]  clonazePAM (KLONOPIN) 1 MG tablet Take 1 tablet (1 mg total) by mouth 2 (two) times daily. Patient not taking: No sig reported 08/28/15   Gladstone Lighter, MD  gabapentin (NEURONTIN) 300 MG capsule Take 1 capsule (300 mg total) by mouth 3 (three) times daily. Patient  taking differently: Take 300 mg by mouth 2 (two) times daily. 08/28/15   Gladstone Lighter, MD  levETIRAcetam (KEPPRA) 500 MG tablet Take 750 mg by mouth 3 (three) times daily. Take 1 and 1/2 tablets three times daily    [provider]    Allergies Patient has no known allergies.  Family History  Problem Relation Age of Onset  . Hypertension Other     Social History Social History   Tobacco Use  . Smoking status: Current Every Day Smoker    Packs/day: 1.00    Types: Cigarettes  . Smokeless tobacco: Never Used  Substance Use Topics  . Alcohol use: No    Review  of Systems  Constitutional: No fever/chills Eyes: No visual changes. ENT: No sore throat. Cardiovascular: Denies chest pain. Respiratory: Denies shortness of breath. Gastrointestinal: No abdominal pain.  No nausea, no vomiting.  No diarrhea.  No constipation. Genitourinary: Negative for dysuria. Musculoskeletal: Negative for back pain. Skin: Negative for rash. Neurological: Negative for headaches, positive for weakness, slurred speech, and blurry vision.  ____________________________________________   PHYSICAL EXAM:  VITAL SIGNS: ED Triage Vitals  Enc Vitals Group     BP 10/03/20 1123 137/69     Pulse Rate 10/03/20 1123 82     Resp 10/03/20 1123 19     Temp 10/03/20 1123 97.9 F (36.6 C)     Temp Source 10/03/20 1123 Oral     SpO2 10/03/20 1123 94 %     Weight 10/03/20 1113 127 lb (57.6 kg)     Height 10/03/20 1113 5\' 2"  (1.575 m)     Head Circumference --      Peak Flow --      Pain Score 10/03/20 1112 0     Pain Loc --      Pain Edu? --      Excl. in [place]? --     Constitutional: Alert and oriented. Eyes: Conjunctivae are normal.  Pupils equal round and reactive to light. Head: Atraumatic. Nose: No congestion/rhinnorhea. Mouth/Throat: Mucous membranes are moist. Neck: Normal ROM Cardiovascular: Normal rate, regular rhythm. Grossly normal heart sounds. Respiratory: Normal respiratory effort.  No retractions. Lungs CTAB. Gastrointestinal: Soft and nontender. No distention. Genitourinary: deferred Musculoskeletal: No lower extremity tenderness nor edema. Neurologic: Slurred speech noted.  Patient noted to be globally weak with no focal deficits. Skin:  Skin is warm, dry and intact. No rash noted. Psychiatric: Mood and affect are normal. Speech and behavior are normal.  ____________________________________________   LABS (all labs ordered are listed, but only abnormal results are displayed)  Labs Reviewed  CBC WITH DIFFERENTIAL/PLATELET - Abnormal; Notable for  the following components:      Result Value   Lymphs Abs 0.6 (*)    All other components within normal limits  COMPREHENSIVE METABOLIC PANEL - Abnormal; Notable for the following components:   Creatinine, Ser 1.21 (*)    Calcium 7.8 (*)    Total Protein 8.5 (*)    Alkaline Phosphatase 167 (*)    GFR, Estimated 49 (*)    All other components within normal limits  COOXEMETRY PANEL - Abnormal; Notable for the following components:   Carboxyhemoglobin 12.6 (*)    All other components within normal limits  PHENYTOIN LEVEL, TOTAL - Abnormal; Notable for the following components:   Phenytoin Lvl <2.5 (*)    All other components within normal limits  TROPONIN I (HIGH SENSITIVITY) - Abnormal; Notable for the following components:   Troponin I (High Sensitivity) 31 (*)  All other components within normal limits  TROPONIN I (HIGH SENSITIVITY) - Abnormal; Notable for the following components:   Troponin I (High Sensitivity) 31 (*)    All other components within normal limits  SARS CORONAVIRUS 2 (TAT 6-24 HRS)  ETHANOL  URINALYSIS, COMPLETE (UACMP) WITH MICROSCOPIC  HIV ANTIBODY (ROUTINE TESTING W REFLEX)  LACTIC ACID, PLASMA  LACTIC ACID, PLASMA   ____________________________________________  EKG  ED ECG REPORT I, Blake Divine, the attending physician, personally viewed and interpreted this ECG.   Date: 10/03/2020  EKG Time: 11:21  Rate: 81  Rhythm: normal sinus rhythm, frequent PVCs  Axis: LAD  Intervals:none  ST&T Change: Lateral T wave inversions and ST depressions   PROCEDURES  Procedure(s) performed (including Critical Care):  .Critical Care Performed by: Blake Divine, MD Authorized by: Blake Divine, MD   Critical care provider statement:    Critical care time (minutes):  45   Critical care time was exclusive of:  Separately billable procedures and treating other patients and teaching time   Critical care was necessary to treat or prevent imminent or  life-threatening deterioration of the following conditions:  Respiratory failure and cardiac failure   Critical care was time spent personally by me on the following activities:  Discussions with consultants, evaluation of patient's response to treatment, examination of patient, ordering and performing treatments and interventions, ordering and review of laboratory studies, ordering and review of radiographic studies, pulse oximetry, re-evaluation of patient's condition, obtaining history from patient or surrogate and review of old charts   I assumed direction of critical care for this patient from another provider in my specialty: no       ____________________________________________   INITIAL IMPRESSION / ASSESSMENT AND PLAN / ED COURSE      70 year old female with past medical history of hypertension, hyperlipidemia, CAD, and seizures who presents to the ED for altered mental status and generalized weakness.   She appears globally weak on my assessment but with no focal deficits.  Given strong smell of gas at home, carboxyhemoglobin was checked and noted to be above 12.  This is likely contributing to her weakness and I am also concerned she is having EKG changes related to this.  Remainder of labs are remarkable for mildly elevated troponin, which is stable on recheck.  Patient was placed on a nonrebreather but remains globally weak and slightly disoriented.  CT head performed and negative for acute process.  Patient does have fracture at greater trochanter of right shoulder that she reports was due to a fall about 1 week ago.  Right arm was placed in a sling.  Case discussed with hospitalist for admission.      ____________________________________________   FINAL CLINICAL IMPRESSION(S) / ED DIAGNOSES  Final diagnoses:  Generalized weakness  Altered mental status, unspecified altered mental status type  Toxic effect of carbon monoxide, unintentional, initial encounter     ED  Discharge Orders    None       Note:  This document was prepared using Dragon voice recognition software and may include unintentional dictation errors.   Blake Divine, MD 10/03/20 1535

## 2020-10-03 NOTE — ED Notes (Signed)
Multiple attempts by this RN to start another IV with no success, Janett Billow RN attempting

## 2020-10-04 DIAGNOSIS — R531 Weakness: Secondary | ICD-10-CM

## 2020-10-04 DIAGNOSIS — T5891XD Toxic effect of carbon monoxide from unspecified source, accidental (unintentional), subsequent encounter: Secondary | ICD-10-CM | POA: Diagnosis not present

## 2020-10-04 DIAGNOSIS — R4182 Altered mental status, unspecified: Secondary | ICD-10-CM

## 2020-10-04 DIAGNOSIS — I5030 Unspecified diastolic (congestive) heart failure: Secondary | ICD-10-CM | POA: Diagnosis not present

## 2020-10-04 LAB — BASIC METABOLIC PANEL
Anion gap: 12 (ref 5–15)
BUN: 23 mg/dL (ref 8–23)
CO2: 27 mmol/L (ref 22–32)
Calcium: 8 mg/dL — ABNORMAL LOW (ref 8.9–10.3)
Chloride: 96 mmol/L — ABNORMAL LOW (ref 98–111)
Creatinine, Ser: 1.23 mg/dL — ABNORMAL HIGH (ref 0.44–1.00)
GFR, Estimated: 48 mL/min — ABNORMAL LOW (ref >60)
Glucose, Bld: 88 mg/dL (ref 70–99)
Potassium: 2.9 mmol/L — ABNORMAL LOW (ref 3.5–5.1)
Sodium: 135 mmol/L (ref 135–145)

## 2020-10-04 LAB — CBC
HCT: 33.5 % — ABNORMAL LOW (ref 36.0–46.0)
Hemoglobin: 11.2 g/dL — ABNORMAL LOW (ref 12.0–15.0)
MCH: 30.9 pg (ref 26.0–34.0)
MCHC: 33.4 g/dL (ref 30.0–36.0)
MCV: 92.3 fL (ref 80.0–100.0)
Platelets: 138 10*3/uL — ABNORMAL LOW (ref 150–400)
RBC: 3.63 MIL/uL — ABNORMAL LOW (ref 3.87–5.11)
RDW: 14.6 % (ref 11.5–15.5)
WBC: 3.8 10*3/uL — ABNORMAL LOW (ref 4.0–10.5)
nRBC: 0 % (ref 0.0–0.2)

## 2020-10-04 LAB — SARS CORONAVIRUS 2 (TAT 6-24 HRS): SARS Coronavirus 2: NEGATIVE

## 2020-10-04 MED ORDER — HYDROCODONE-ACETAMINOPHEN 5-325 MG PO TABS
1.0000 | ORAL_TABLET | Freq: Four times a day (QID) | ORAL | Status: DC | PRN
  Administered 2020-10-04 – 2020-10-05 (×3): 1 via ORAL
  Filled 2020-10-04 (×3): qty 1

## 2020-10-04 MED ORDER — HYDROCHLOROTHIAZIDE 12.5 MG PO CAPS
12.5000 mg | ORAL_CAPSULE | Freq: Every day | ORAL | Status: DC
  Administered 2020-10-05: 12.5 mg via ORAL
  Filled 2020-10-04: qty 1

## 2020-10-04 MED ORDER — POTASSIUM CHLORIDE CRYS ER 20 MEQ PO TBCR
40.0000 meq | EXTENDED_RELEASE_TABLET | Freq: Once | ORAL | Status: AC
  Administered 2020-10-04: 40 meq via ORAL
  Filled 2020-10-04: qty 2

## 2020-10-04 NOTE — Progress Notes (Addendum)
PROGRESS NOTE    Anne Phillips  MPN:[id number] DOB: 03-Sep-1950 DOA: 10/03/2020 PCP: Pcp, No  Brief Narrative: 69/F with history of CAD, seizure disorder, COPD, tobacco abuse presented to the ED after a fall and weakness.  She was in her usual state of health that morning,, took her a.m. medications, gradually started feeling weak prior to falling,, EMS was called, upon arrival they noted a strong smell of gas in the home from a heating unit, patient was also disoriented, blood work noted elevated carboxyhemoglobin levels. -Other work-up in the emergency room noted mildly elevated creatinine, CT head was unremarkable, right shoulder x-ray showed acute minimally impacted fracture of the greater tuberosity. -Poison control was called and she was placed on 100% FiO2 via nonrebreather mask and admitted overnight for observation   Assessment & Plan:   Carbon monoxide poisoning Confusion/encephalopathy -Improving, she is awake alert oriented to self and partly to place at this time, still with some confusion though, anticipate this will improve with time -Oxygenated with a nonrebreather mask yesterday for 4 to 6 hours per poison control recommendations, neurochecks unremarkable -PT OT eval -TOC consult -unclear about home safety  Fall Right shoulder, greater tuberosity fracture -Ortho called yesterday, Dr. Nicola Police recommended office follow-up in 2 weeks and sling placement  Seizure disorder -Continue Keppra and Vimpat  Hypertension -Continue HCTZ  Hypokalemia -Likely from HCTZ use, replace  Depression/anxiety -Continue Remeron and home regimen of clonazepam  History of COPD Tobacco dependence -Counseled, nicotine patch  Chronic diastolic CHF -Clinically euvolemic, continue carvedilol losartan and HCTZ  DVT prophylaxis: Lovenox Code Status: Full code Family Communication: No family at bedside Disposition Plan:  Status is: Observation  The patient remains OBS appropriate and  will d/c before 2 midnights.  Dispo: The patient is from: Home              Anticipated d/c is to: Home              Anticipated d/c date is: 1 day              Patient currently is not medically stable to d/c.   Difficult to place patient No   Consultants:    Procedures:   Antimicrobials:    Subjective: -Feels okay, denies any complaints this morning, confused about the events leading to fall,  Objective: Vitals:   10/03/20 1800 10/03/20 2207 10/03/20 2354 10/04/20 0749  BP: 97/75 119/67 (!) 140/93 (!) 138/57  Pulse: 71 68 81 66  Resp: 18 16 16 16   Temp:  97.6 F (36.4 C) (!) 97.5 F (36.4 C) 98.7 F (37.1 C)  TempSrc:  Oral    SpO2: 100% 99% 97% 99%  Weight:      Height:        Intake/Output Summary (Last 24 hours) at 10/04/2020 1059 Last data filed at 10/04/2020 0086 Gross per 24 hour  Intake 240 ml  Output 1 ml  Net 239 ml   Filed Weights   10/03/20 1113  Weight: 57.6 kg    Examination:  General exam: Chronically ill thinly built female sitting up in bed, awake alert oriented to self and partly to place only CVS: S1-S2, regular rate rhythm Lungs: Clear anteriorly Abdomen: Soft, nontender, bowel sounds present Extremities: No edema, right arm in a sling Skin: No rash on exposed skin Psych: Poor insight and judgment  Data Reviewed:   CBC: Recent Labs  Lab 10/03/20 1115 10/04/20 0631  WBC 5.5 3.8*  NEUTROABS 4.1  --  HGB 13.6 11.2*  HCT 40.9 33.5*  MCV 94.2 92.3  PLT 160 539*   Basic Metabolic Panel: Recent Labs  Lab 10/03/20 1115 10/04/20 0631  NA 137 135  K 3.8 2.9*  CL 98 96*  CO2 24 27  GLUCOSE 89 88  BUN 15 23  CREATININE 1.21* 1.23*  CALCIUM 7.8* 8.0*   GFR: Estimated Creatinine Clearance: 34.6 mL/min (A) (by C-G formula based on SCr of 1.23 mg/dL (H)). Liver Function Tests: Recent Labs  Lab 10/03/20 1115  AST 19  ALT 9  ALKPHOS 167*  BILITOT 1.2  PROT 8.5*  ALBUMIN 4.3   No results for input(s): LIPASE, AMYLASE in  the last 168 hours. No results for input(s): AMMONIA in the last 168 hours. Coagulation Profile: No results for input(s): INR, PROTIME in the last 168 hours. Cardiac Enzymes: No results for input(s): CKTOTAL, CKMB, CKMBINDEX, TROPONINI in the last 168 hours. BNP (last 3 results) No results for input(s): PROBNP in the last 8760 hours. HbA1C: No results for input(s): HGBA1C in the last 72 hours. CBG: No results for input(s): GLUCAP in the last 168 hours. Lipid Profile: No results for input(s): CHOL, HDL, LDLCALC, TRIG, CHOLHDL, LDLDIRECT in the last 72 hours. Thyroid Function Tests: No results for input(s): TSH, T4TOTAL, FREET4, T3FREE, THYROIDAB in the last 72 hours. Anemia Panel: No results for input(s): VITAMINB12, FOLATE, FERRITIN, TIBC, IRON, RETICCTPCT in the last 72 hours. Urine analysis:    Component Value Date/Time   COLORURINE YELLOW (A) 10/03/2020 1828   APPEARANCEUR HAZY (A) 10/03/2020 1828   LABSPEC 1.008 10/03/2020 1828   PHURINE 5.0 10/03/2020 1828   GLUCOSEU NEGATIVE 10/03/2020 1828   HGBUR SMALL (A) 10/03/2020 1828   BILIRUBINUR NEGATIVE 10/03/2020 1828   KETONESUR NEGATIVE 10/03/2020 1828   PROTEINUR NEGATIVE 10/03/2020 1828   NITRITE POSITIVE (A) 10/03/2020 1828   LEUKOCYTESUR SMALL (A) 10/03/2020 1828   Sepsis Labs: @LABRCNTIP (procalcitonin:4,lacticidven:4)  ) Recent Results (from the past 240 hour(s))  SARS CORONAVIRUS 2 (TAT 6-24 HRS) Nasopharyngeal Nasopharyngeal Swab     Status: None   Collection Time: 10/03/20 12:58 PM   Specimen: Nasopharyngeal Swab  Result Value Ref Range Status   SARS Coronavirus 2 NEGATIVE NEGATIVE Final    Comment: (NOTE) SARS-CoV-2 target nucleic acids are NOT DETECTED.  The SARS-CoV-2 RNA is generally detectable in upper and lower respiratory specimens during the acute phase of infection. Negative results do not preclude SARS-CoV-2 infection, do not rule out co-infections with other pathogens, and should not be used as  the sole basis for treatment or other patient management decisions. Negative results must be combined with clinical observations, patient history, and epidemiological information. The expected result is Negative.  Fact Sheet for Patients: SugarRoll.be  Fact Sheet for Healthcare Providers: https://www.woods-mathews.com/  This test is not yet approved or cleared by the [place] FDA and  has been authorized for detection and/or diagnosis of SARS-CoV-2 by FDA under an Emergency Use Authorization (EUA). This EUA will remain  in effect (meaning this test can be used) for the duration of the COVID-19 declaration under Se ction 564(b)(1) of the Act, 21 U.S.C. section 360bbb-3(b)(1), unless the authorization is terminated or revoked sooner.  Performed at [facility], [place] [address], [place], [place] [address]          Radiology Studies: DG Chest 2 View  Result Date: 10/03/2020 CLINICAL DATA:  Fall, weakness in arms and legs, stroke symptoms; smoker, hypertension, coronary artery disease EXAM: CHEST - 2 VIEW  COMPARISON:  08/21/2015 FINDINGS: Upper normal heart size. Mediastinal contours and pulmonary vascularity normal. Atherosclerotic calcification aorta. Emphysematous and bronchitic changes likely reflecting COPD. Coronary arterial stents. Minimal bibasilar atelectasis without infiltrate or pleural effusion. No pneumothorax or acute osseous findings. IMPRESSION: COPD changes with minimal bibasilar atelectasis. Aortic Atherosclerosis (ICD10-I70.0) and Emphysema (ICD10-J43.9). Electronically Signed   By: Lavonia Dana M.D.   On: 10/03/2020 12:54   DG Shoulder Right  Result Date: 10/03/2020 CLINICAL DATA:  Fall. EXAM: RIGHT SHOULDER - 2+ VIEW COMPARISON:  None. FINDINGS: Acute minimally impacted fracture of the greater tuberosity. No dislocation. Mild degenerative changes of the acromioclavicular joint. Glenohumeral joint space is preserved.  Soft tissues are unremarkable. IMPRESSION: 1. Acute minimally impacted fracture of the greater tuberosity. Electronically Signed   By: Titus Dubin M.D.   On: 10/03/2020 12:48   CT Head Wo Contrast  Result Date: 10/03/2020 CLINICAL DATA:  Weakness in arms and legs. EXAM: CT HEAD WITHOUT CONTRAST TECHNIQUE: Contiguous axial images were obtained from the base of the skull through the vertex without intravenous contrast. COMPARISON:  08/20/2015 FINDINGS: Brain: Cerebral and cerebellar atrophy. No mass lesion, hemorrhage, hydrocephalus, acute infarct, intra-axial, or extra-axial fluid collection. Vascular: Intracranial atherosclerosis. Skull: No significant soft tissue swelling.  No skull fracture. Sinuses/Orbits: Normal imaged portions of the orbits and globes. Clear mastoid air cells. Aerated petrous apices. Other: None. IMPRESSION: Cerebral/cerebellar atrophy.  No acute findings. Electronically Signed   By: Abigail Miyamoto M.D.   On: 10/03/2020 13:44        Scheduled Meds: . atorvastatin  40 mg Oral Daily  . carvedilol  12.5 mg Oral BID WC  . clonazePAM  1 mg Oral BID  . empagliflozin  10 mg Oral Daily  . enoxaparin (LOVENOX) injection  40 mg Subcutaneous Q24H  . gabapentin  300 mg Oral BID  . [START ON 10/05/2020] hydrochlorothiazide  12.5 mg Oral Daily  . influenza vaccine adjuvanted  0.5 mL Intramuscular Tomorrow-1000  . lacosamide  150 mg Oral BID  . levETIRAcetam  500 mg Oral BID  . levETIRAcetam  750 mg Oral QHS  . losartan  100 mg Oral Daily  . mirtazapine  15 mg Oral QHS  . nicotine  7 mg Transdermal Daily  . pneumococcal 23 valent vaccine  0.5 mL Intramuscular Tomorrow-1000  . spironolactone  12.5 mg Oral Daily   Continuous Infusions:   LOS: 0 days    Time spent: 31min  Domenic Polite, MD [facility]  10/04/2020, 10:59 AM

## 2020-10-04 NOTE — TOC Initial Note (Signed)
Transition of Care [facility]) - Initial/Assessment Note    Patient Details  Name: Anne Phillips MRN: [id number] Date of Birth: Feb 15, 1951  [facility] CM/SW Contact:    Shelbie Ammons, RN Phone Number: 10/04/2020, 2:08 PM  Clinical Narrative:   RNCM met with patient in room. Patient lying in bed reports to having pain in her shoulder and feels she is in need of more pain medication. Patient reports that she is agreeable to having home health set up and doesn't have a preference as to who. She reports she would like this CM to reach out to her sister for other questions due to her current pain.  RNCM reached out to patient's sister [name] to discuss issues related to carbon monoxide that patient was having when she came in. [name] reports that patient's heater was replaced and that this has been fixed.  RNCM reached out to [facility] with Alvis Lemmings and he accepted referral for home health.                Expected Discharge Plan: [place] Barriers to Discharge: No Barriers Identified   Patient Goals and CMS Choice        Expected Discharge Plan and Services Expected Discharge Plan: [place]       Living arrangements for the past 2 months: Single Family Home                           HH Arranged: PT,OT,Nurse's Aide HH Agency: [place] Date [facility] Agency Contacted: 10/04/20 Time HH Agency Contacted: 1404 Representative spoke with at [place]: Tommi Rumps  Prior Living Arrangements/Services Living arrangements for the past 2 months: [place] Lives with:: Self Patient language and need for interpreter reviewed:: Yes Do you feel safe going back to the place where you live?: Yes      Need for Family Participation in Patient Care: Yes (Comment) Care giver support system in place?: Yes (comment)   Criminal Activity/Legal Involvement Pertinent to Current Situation/Hospitalization: No - Comment as needed  Activities of Daily  Living Home Assistive Devices/Equipment: Eyeglasses ADL Screening (condition at time of admission) Patient's cognitive ability adequate to safely complete daily activities?: Yes Is the patient deaf or have difficulty hearing?: No Does the patient have difficulty seeing, even when wearing glasses/contacts?: No Does the patient have difficulty concentrating, remembering, or making decisions?: No Patient able to express need for assistance with ADLs?: Yes Does the patient have difficulty dressing or bathing?: No Independently performs ADLs?: Yes (appropriate for developmental age) Does the patient have difficulty walking or climbing stairs?: No Weakness of Legs: Both Weakness of Arms/Hands: Both  Permission Sought/Granted                  Emotional Assessment Appearance:: Appears stated age Attitude/Demeanor/Rapport: Apprehensive Affect (typically observed): Appropriate,Calm Orientation: : Oriented to Self,Oriented to Place,Oriented to  Time,Oriented to Situation Alcohol / Substance Use: Not Applicable Psych Involvement: No (comment)  Admission diagnosis:  Carbon monoxide poisoning [T58.91XA] Generalized weakness [R53.1] Toxic effect of carbon monoxide, unintentional, initial encounter [T58.91XA] Altered mental status, unspecified altered mental status type [R41.82] Patient Active Problem List   Diagnosis Date Noted  . Carbon monoxide poisoning 10/03/2020  . Fall at home, initial encounter 10/03/2020  . Seizures ([place])   . Hypertension   . Depression 08/27/2015  . Acute delirium 08/27/2015  . Depression, major, single episode, moderate ([place])   . (  HFpEF) heart failure with preserved ejection fraction ([place])   . Disorientation   . Pyrexia   . Hypoxia   . Non-traumatic rhabdomyolysis   . Coronary artery disease involving native coronary artery of native heart without angina pectoris   . Sepsis ([place]) 08/20/2015  . NSTEMI (non-ST elevated myocardial infarction) ([place])  08/20/2015  . Rhabdomyolysis 08/20/2015   PCP:  Merryl Hacker No Pharmacy:   [place], [place] [address] [place] [address] Phone: [phone] Fax: [phone]     Social Determinants of Health (SDOH) Interventions    Readmission Risk Interventions No flowsheet data found.

## 2020-10-04 NOTE — Evaluation (Signed)
Occupational Therapy Evaluation Patient Details Name: Anne Phillips MRN: [id number] DOB: October 12, 1950 Today's Date: 10/04/2020    History of Present Illness Anne Phillips is a 11yoF who comes to [facility] 2/2 after acute onset weakness in 4 limbs, fall sustained. EMS reports a strong gas smell in home. Patient was noted to have an elevated carboxyhemoglobin level. Pt admitted for carbon monoxide poisoning. PMH: CAD, HLD, HTN, seizures, thyroid disease.   Clinical Impression   Pt was seen for OT evaluation this date. Prior to hospital admission, pt was modified independent with ADL tasks, medication mgt. Pt reports that her sister lives with her except when the sister babysits and that her sister does majority of cooking, cleaning, and provides transportation when needed. Currently pt demonstrates impairments in RUE functional use, shoulder ROM/strength/pain, BLE weakness, and impaired balance with hx of multiple falls (See OT problem list) which functionally limit her ability to perform ADL/self-care tasks safely. Pt currently requires PRN MIN A for bathing, dressing, CGA for toilet transfers.  Pt instructed in hemi techniques for dressing, sling mgt, falls prevention. Pt verbalized understanding. Pt would benefit from skilled OT services to address noted impairments and functional limitations (see below for any additional details) in order to maximize safety and independence while minimizing falls risk and caregiver burden. Upon hospital discharge, recommend HHOT to maximize pt safety and return to functional independence during meaningful occupations of daily life.     Follow Up Recommendations  Home health OT;Supervision - Intermittent    Equipment Recommendations  None recommended by OT    Recommendations for Other Services       Precautions / Restrictions Precautions Precautions: Fall Required Braces or Orthoses: Sling Restrictions Weight Bearing Restrictions: Yes RUE Weight Bearing: Non  weight bearing (per Dr. Broadus John secure chat with PT)      Mobility Bed Mobility Overal bed mobility: Modified Independent                  Transfers Overall transfer level: Needs assistance Equipment used: None;1 person hand held assist Transfers: Sit to/from Stand Sit to Stand: Min guard         General transfer comment: mostly steady, no frank LOB    Balance Overall balance assessment: History of Falls                                         ADL either performed or assessed with clinical judgement   ADL Overall ADL's : Needs assistance/impaired                                       General ADL Comments: PRN Min A for dressing, bathing 2/2 decr functional RUE use     Vision Baseline Vision/History: Wears glasses Wears Glasses: At all times Patient Visual Report: No change from baseline       Perception     Praxis      Pertinent Vitals/Pain Pain Assessment: 0-10 Pain Score: 4  Faces Pain Scale: Hurts a little bit Pain Location: Right shoulder (but does not inhibit attempting to use RUE freely with impulse) Pain Descriptors / Indicators: Aching Pain Intervention(s): Limited activity within patient's tolerance;Monitored during session;Patient requesting pain meds-RN notified;RN gave pain meds during session;Repositioned     Hand Dominance Right   Extremity/Trunk Assessment Upper Extremity Assessment  Upper Extremity Assessment: Generalized weakness;RUE deficits/detail RUE Deficits / Details: in sling, NWBing, although does use at midline for bilateral tasks despite cues   Lower Extremity Assessment Lower Extremity Assessment: Generalized weakness       Communication Communication Communication: No difficulties   Cognition Arousal/Alertness:  (drowsy) Behavior During Therapy: WFL for tasks assessed/performed Overall Cognitive Status: Within Functional Limits for tasks assessed                                  General Comments: oriented x3 (vague situational detail), follows commands   General Comments       Exercises Other Exercises Other Exercises: Pt instructed in hemi techniques for dressing, sling mgt, falls prevention   Shoulder Instructions      Home Living Family/patient expects to be discharged to:: Private residence Living Arrangements: Alone (lives alone "most of the time") Available Help at Discharge: Family (Older sister Anne Phillips helps with transportation) Type of Home: House (rents a house) Home Access: Stairs to enter Technical brewer of Steps: 4 Entrance Stairs-Rails: Right Home Layout: One level     Bathroom Shower/Tub: Teacher, early years/pre: Standard     Home Equipment: Environmental consultant - 2 wheels;Kasandra Knudsen - single point   Additional Comments: intermittent use of assitive device 'when (she) needs it."      Prior Functioning/Environment Level of Independence: Independent with assistive device(s);Needs assistance  Gait / Transfers Assistance Needed: Does not drive anymore, but still has a car and her license. Reports a prior shoulder fracture, but is not historically detailed. In sling. ADL's / Homemaking Assistance Needed: Pt reports indep with ADL (mod indep since shoulder fracture), med mgt, but sister does majority of cooking, cleaning, and provides transportation   Comments: Pt endorses 4-5 falls in past 6 months        OT Problem List: Decreased strength;Pain;Decreased range of motion;Decreased safety awareness;Decreased activity tolerance;Impaired balance (sitting and/or standing);Decreased knowledge of use of DME or AE;Impaired UE functional use      OT Treatment/Interventions: Self-care/ADL training;Therapeutic exercise;Therapeutic activities;DME and/or AE instruction;Patient/family education;Balance training;Manual therapy    OT Goals(Current goals can be found in the care plan section) Acute Rehab OT Goals Patient Stated Goal: go  home OT Goal Formulation: With patient Time For Goal Achievement: 10/18/20 Potential to Achieve Goals: Good ADL Goals Pt Will Perform Upper Body Dressing: with modified independence;sitting Pt Will Perform Lower Body Dressing: with modified independence;sit to/from stand Pt Will Transfer to Toilet: with modified independence;ambulating;regular height toilet Additional ADL Goal #1: Pt will independently instruct family/caregiver in shoulder sling mgt  OT Frequency: Min 1X/week   Barriers to D/C:            Co-evaluation              AM-PAC OT "6 Clicks" Daily Activity     Outcome Measure Help from another person eating meals?: A Little Help from another person taking care of personal grooming?: A Little Help from another person toileting, which includes using toliet, bedpan, or urinal?: A Little Help from another person bathing (including washing, rinsing, drying)?: A Little Help from another person to put on and taking off regular upper body clothing?: A Little Help from another person to put on and taking off regular lower body clothing?: A Little 6 Click Score: 18   End of Session    Activity Tolerance: Patient tolerated treatment well Patient left: in bed;with call bell/phone  within reach;with bed alarm set  OT Visit Diagnosis: Other abnormalities of gait and mobility (R26.89);Muscle weakness (generalized) (M62.81);Pain;Repeated falls (R29.6) Pain - Right/Left: Right Pain - part of body: Shoulder                Time: [id number] OT Time Calculation (min): 15 min Charges:  OT General Charges $OT Visit: 1 Visit OT Evaluation $OT Eval Low Complexity: 1 Low OT Treatments $Self Care/Home Management : 8-22 mins  Jeni Salles, MPH, MS, OTR/L ascom [phone] 10/04/20, 10:18 AM

## 2020-10-04 NOTE — Evaluation (Signed)
Physical Therapy Evaluation Patient Details Name: Anne Phillips MRN: [id number] DOB: Jun 08, 1951 Today's Date: 10/04/2020   History of Present Illness  Anne Phillips is a 63yoF who comes [facility] 2[facility]2 after acute onset weakness in 4 limbs, fall sustained. EMS reports a strong gas smell in home. Patient was noted to have an elevated carboxyhemoglobin level. Pt admitted for carbon monoxide poisoning. PMH: CAD, HLD, HTN, seizures, thyroid disease.  Clinical Impression  Pt admitted with above diagnosis. Pt currently with functional limitations due to the deficits listed below (see "PT Problem List"). Upon entry, pt in bed, asleep. Pt is heavy sleeper remains fairly drowsy after waking, slightly delayed but is oriented x4. The pt is pleasant, interactive, and able to provide info regarding prior level of function, both in tolerance and independence, but does have inconsistencies in timelines when discussing her current and prior shoulder fractures. No assist needed for bed mobility and transfers, but hand held assist provided for transfers and AMB for safety. Pt is cued/educated on RUE disuse due to fracture status, however she does not attend to this.   Patient's performance this date reveals decreased ability, independence, and tolerance in performing all basic mobility required for performance of activities of daily living. Pt requires additional DME, close physical assistance, and cues for safe participate in mobility. Pt will benefit from skilled PT intervention to increase independence and safety with basic mobility in preparation for discharge to the venue listed below.       Follow Up Recommendations Home health PT;Supervision for mobility/OOB    Equipment Recommendations  None recommended by PT    Recommendations for Other Services       Precautions / Restrictions Precautions Precautions: Fall Required Braces or Orthoses: Sling Restrictions Weight Bearing Restrictions: Yes RUE Weight  Bearing: Non weight bearing (per Dr. Broadus John secure chat)      Mobility  Bed Mobility Overal bed mobility: Modified Independent                  Transfers Overall transfer level: Needs assistance Equipment used: None;1 person hand held assist Transfers: Sit to/from Stand Sit to Stand: Min guard         General transfer comment: mostly steady, no frank LOB  Ambulation/Gait Ambulation/Gait assistance: Min guard Gait Distance (Feet): 60 Feet Assistive device: 1 person hand held assist;None Gait Pattern/deviations: Step-through pattern     General Gait Details: minimal unsteadiness, denies acute impairment, couldve AMB farther easily, but pt wants to each breakfast before it becomes cold.  Stairs            Wheelchair Mobility    Modified Rankin (Stroke Patients Only)       Balance Overall balance assessment: Modified Independent;History of Falls;Mild deficits observed, not formally tested                                           Pertinent Vitals/Pain Pain Assessment: Faces Faces Pain Scale: Hurts a little bit Pain Location: Right shoulder (but does not inhibit attempting to use RUE freely with impulse. Pain Descriptors / Indicators: Aching Pain Intervention(s): Repositioned    Home Living Family/patient expects to be discharged to:: Private residence Living Arrangements: Alone (lives alone "most of the time") Available Help at Discharge: Family (Older sister Anne Phillips helps with transportation) Type of Home: House (rents a house)       Home Layout: One  level Home Equipment: Walker - 2 wheels;Anne Phillips - single point Additional Comments: intermittent use of assitive device 'when (she) needs it."    Prior Function Level of Independence: Independent with assistive device(s)         Comments: Does not drive naymore,but still has a car and her liscence. Repors a prior shoulder fracture, but is not historically detailed.     Hand  Dominance        Extremity/Trunk Assessment                Communication      Cognition Arousal/Alertness:  (drowsy, reports to not be a morning person.)                                            General Comments      Exercises     Assessment/Plan    PT Assessment Patient needs continued PT services  PT Problem List Decreased strength;Decreased activity tolerance;Decreased range of motion;Decreased cognition;Decreased balance;Decreased safety awareness;Decreased knowledge of precautions;Decreased mobility;Decreased coordination       PT Treatment Interventions DME instruction;Balance training;Gait training;Stair training;Functional mobility training;Therapeutic activities;Therapeutic exercise;Patient/family education    PT Goals (Current goals can be found in the Care Plan section)  Acute Rehab PT Goals Patient Stated Goal: none stated PT Goal Formulation: With patient Time For Goal Achievement: 10/18/20    Frequency Min 2X/week   Barriers to discharge Inaccessible home environment unclear if carbon monoxide leak is fixed    Co-evaluation               AM-PAC PT "6 Clicks" Mobility  Outcome Measure Help needed turning from your back to your side while in a flat bed without using bedrails?: None Help needed moving from lying on your back to sitting on the side of a flat bed without using bedrails?: None Help needed moving to and from a bed to a chair (including a wheelchair)?: A Little Help needed standing up from a chair using your arms (e.g., wheelchair or bedside chair)?: A Little Help needed to walk in hospital room?: A Little Help needed climbing 3-5 steps with a railing? : A Little 6 Click Score: 20    End of Session   Activity Tolerance: Patient tolerated treatment well;No increased pain;Patient limited by lethargy Patient left: in chair;with chair alarm set;with call bell/phone within reach;Other (comment) (breakfast  presented)   PT Visit Diagnosis: Unsteadiness on feet (R26.81);Difficulty in walking, not elsewhere classified (R26.2);History of falling (Z91.81);Other abnormalities of gait and mobility (R26.89)    Time: 0900-0920 PT Time Calculation (min) (ACUTE ONLY): 20 min   Charges:   PT Evaluation $PT Eval Moderate Complexity: 1 Mod PT Treatments $Self Care/Home Management: 8-22        9:39 AM, 10/04/20 Etta Grandchild, PT, DPT Physical Therapist - [facility]  [phone] ([place])   [place] [facility] 10/04/2020, 9:34 AM

## 2020-10-05 DIAGNOSIS — T5891XD Toxic effect of carbon monoxide from unspecified source, accidental (unintentional), subsequent encounter: Secondary | ICD-10-CM | POA: Diagnosis not present

## 2020-10-05 LAB — CBC
HCT: 33.3 % — ABNORMAL LOW (ref 36.0–46.0)
Hemoglobin: 11.1 g/dL — ABNORMAL LOW (ref 12.0–15.0)
MCH: 31.2 pg (ref 26.0–34.0)
MCHC: 33.3 g/dL (ref 30.0–36.0)
MCV: 93.5 fL (ref 80.0–100.0)
Platelets: 140 10*3/uL — ABNORMAL LOW (ref 150–400)
RBC: 3.56 MIL/uL — ABNORMAL LOW (ref 3.87–5.11)
RDW: 14.7 % (ref 11.5–15.5)
WBC: 3.4 10*3/uL — ABNORMAL LOW (ref 4.0–10.5)
nRBC: 0 % (ref 0.0–0.2)

## 2020-10-05 LAB — BASIC METABOLIC PANEL
Anion gap: 7 (ref 5–15)
BUN: 28 mg/dL — ABNORMAL HIGH (ref 8–23)
CO2: 27 mmol/L (ref 22–32)
Calcium: 8.3 mg/dL — ABNORMAL LOW (ref 8.9–10.3)
Chloride: 97 mmol/L — ABNORMAL LOW (ref 98–111)
Creatinine, Ser: 1.12 mg/dL — ABNORMAL HIGH (ref 0.44–1.00)
GFR, Estimated: 54 mL/min — ABNORMAL LOW (ref >60)
Glucose, Bld: 89 mg/dL (ref 70–99)
Potassium: 4.2 mmol/L (ref 3.5–5.1)
Sodium: 131 mmol/L — ABNORMAL LOW (ref 135–145)

## 2020-10-05 MED ORDER — HYDROCODONE-ACETAMINOPHEN 5-325 MG PO TABS: 1.0000 | ORAL_TABLET | Freq: Four times a day (QID) | ORAL | 0 refills | Status: AC | PRN

## 2020-10-05 MED ORDER — ACETAMINOPHEN 325 MG PO TABS: 650.0000 mg | ORAL_TABLET | Freq: Four times a day (QID) | ORAL | Status: DC | PRN

## 2020-10-05 MED ORDER — GABAPENTIN 300 MG PO CAPS: 300.0000 mg | ORAL_CAPSULE | Freq: Two times a day (BID) | ORAL | Status: DC

## 2020-10-05 NOTE — Progress Notes (Addendum)
IV removed, gauze and tape applied. Pt given discharge instructions, verbal understanding given, all questions answered. Pt given hard script for norco, pt made aware of when her last dose was given. Tele removed from pt. Pt assisted with sling placement. Pt calling family for pick up.

## 2020-10-08 NOTE — Discharge Summary (Signed)
Physician Discharge Summary  [name] [id number] DOB: Feb 16, 1951 DOA: 10/03/2020  PCP: Pcp, No  Admit date: 10/03/2020 Discharge date: 10/05/2020  Time spent: 35 minutes  Recommendations for Outpatient Follow-up:  PCP in 1 week Orthopedics Dr. Mack Guise in 1 to 2 weeks for right shoulder fracture  Discharge Diagnoses:  Principal Problem:   Carbon monoxide poisoning Toxic encephalopathy Fall Right humerus fracture   (HFpEF) heart failure with preserved ejection fraction ([place])   Depression   Fall at home, initial encounter   Seizures ([place])   Hypertension   Discharge Condition: Stable Diet recommendation: Low-sodium  Filed Weights   10/03/20 1113  Weight: 57.6 kg    History of present illness:  Anne Phillips with history of CAD, seizure disorder, COPD, tobacco abuse presented to the ED after a fall and weakness.  She was in her usual state of health that morning,, took her a.m. medications, gradually started feeling weak prior to falling,, EMS was called, upon arrival they noted a strong smell of gas in the home from a heating unit, patient was also disoriented, blood work noted elevated carboxyhemoglobin levels. -Other work-up in the emergency room noted mildly elevated creatinine, CT head was unremarkable, right shoulder x-ray showed acute minimally impacted fracture of the greater tuberosity. -Poison control was called and she was placed on 100% FiO2 via nonrebreather mask and admitted overnight for observation  Hospital Course:   Carbon monoxide poisoning Confusion/encephalopathy -Improving, she is awake alert oriented to self and partly to place at this time, still with some confusion though, anticipate this will improve with time -Oxygenated with a nonrebreather mask yesterday for 4 to 6 hours per poison control recommendations, neurochecks unremarkable -Improved and stable now, back to baseline -Social work was consulted, owner has changed the heating system in her  apartment and she is felt to be safe to return back to her previous living environment  Fall Right shoulder, greater tuberosity fracture -Ortho called yesterday, Dr. Nicola Police recommended office follow-up in 2 weeks and sling placement  Seizure disorder -Continue Keppra and Vimpat  Hypertension -Continue HCTZ  Hypokalemia -Likely from HCTZ use, replace  Depression/anxiety -Continue Remeron and home regimen of clonazepam  History of COPD Tobacco dependence -Counseled, nicotine patch  Chronic diastolic CHF -Clinically euvolemic, continue carvedilol losartan and HCTZ  Discharge Exam: Vitals:   10/05/20 0435 10/05/20 0729  BP: 134/82 (!) 149/69  Pulse: 66 64  Resp: 16 16  Temp: 97.9 F (36.6 C) 98.7 F (37.1 C)  SpO2: 96% 98%    General: AAOx3 Cardiovascular: S1S2/RRR Respiratory: CTAB  Discharge Instructions   Discharge Instructions    Diet - low sodium heart healthy   Complete by: As directed    Increase activity slowly   Complete by: As directed      Allergies as of 10/05/2020   No Known Allergies     Medication List    STOP taking these medications   clonazePAM 1 MG tablet Commonly known as: KLONOPIN     TAKE these medications   acetaminophen 325 MG tablet Commonly known as: TYLENOL Take 2 tablets (650 mg total) by mouth every 6 (six) hours as needed for mild pain (or Fever >/= 101).   amLODipine 10 MG tablet Commonly known as: NORVASC Take 10 mg by mouth daily.   atorvastatin 40 MG tablet Commonly known as: LIPITOR Take 40 mg by mouth daily.   carvedilol 12.5 MG tablet Commonly known as: COREG Take 12.5 mg by mouth 2 (two) times daily with a  meal.   empagliflozin 10 MG Tabs tablet Commonly known as: JARDIANCE Take 10 mg by mouth daily.   furosemide 40 MG tablet Commonly known as: LASIX Take 40 mg by mouth daily.   gabapentin 300 MG capsule Commonly known as: NEURONTIN Take 1 capsule (300 mg total) by mouth 2 (two) times  daily.   HYDROcodone-acetaminophen 5-325 MG tablet Commonly known as: NORCO/VICODIN Take 1 tablet by mouth every 6 (six) hours as needed for up to 7 days for moderate pain or severe pain.   levETIRAcetam 500 MG tablet Commonly known as: KEPPRA Take 750 mg by mouth 3 (three) times daily. Take 1 and 1/2 tablets three times daily   losartan 100 MG tablet Commonly known as: COZAAR Take 100 mg by mouth daily.   spironolactone 25 MG tablet Commonly known as: ALDACTONE Take 12.5 mg by mouth daily.   Vimpat 150 MG Tabs Generic drug: Lacosamide Take 150 mg by mouth in the morning and at bedtime.      No Known Allergies  Follow-up Information    Thornton Park, MD. Schedule an appointment as soon as possible for a visit in 1 week(s).   Specialty: Orthopedic Surgery Contact information: [place] [address] [phone]        PCP. Schedule an appointment as soon as possible for a visit in 1 week(s).                The results of significant diagnostics from this hospitalization (including imaging, microbiology, ancillary and laboratory) are listed below for reference.    Significant Diagnostic Studies: DG Chest 2 View  Result Date: 10/03/2020 CLINICAL DATA:  Fall, weakness in arms and legs, stroke symptoms; smoker, hypertension, coronary artery disease EXAM: CHEST - 2 VIEW COMPARISON:  08/21/2015 FINDINGS: Upper normal heart size. Mediastinal contours and pulmonary vascularity normal. Atherosclerotic calcification aorta. Emphysematous and bronchitic changes likely reflecting COPD. Coronary arterial stents. Minimal bibasilar atelectasis without infiltrate or pleural effusion. No pneumothorax or acute osseous findings. IMPRESSION: COPD changes with minimal bibasilar atelectasis. Aortic Atherosclerosis (ICD10-I70.0) and Emphysema (ICD10-J43.9). Electronically Signed   By: Lavonia Dana M.D.   On: 10/03/2020 12:54   DG Shoulder Right  Result Date:  10/03/2020 CLINICAL DATA:  Fall. EXAM: RIGHT SHOULDER - 2+ VIEW COMPARISON:  None. FINDINGS: Acute minimally impacted fracture of the greater tuberosity. No dislocation. Mild degenerative changes of the acromioclavicular joint. Glenohumeral joint space is preserved. Soft tissues are unremarkable. IMPRESSION: 1. Acute minimally impacted fracture of the greater tuberosity. Electronically Signed   By: Titus Dubin M.D.   On: 10/03/2020 12:48   CT Head Wo Contrast  Result Date: 10/03/2020 CLINICAL DATA:  Weakness in arms and legs. EXAM: CT HEAD WITHOUT CONTRAST TECHNIQUE: Contiguous axial images were obtained from the base of the skull through the vertex without intravenous contrast. COMPARISON:  08/20/2015 FINDINGS: Brain: Cerebral and cerebellar atrophy. No mass lesion, hemorrhage, hydrocephalus, acute infarct, intra-axial, or extra-axial fluid collection. Vascular: Intracranial atherosclerosis. Skull: No significant soft tissue swelling.  No skull fracture. Sinuses/Orbits: Normal imaged portions of the orbits and globes. Clear mastoid air cells. Aerated petrous apices. Other: None. IMPRESSION: Cerebral/cerebellar atrophy.  No acute findings. Electronically Signed   By: Abigail Miyamoto M.D.   On: 10/03/2020 13:44    Microbiology: Recent Results (from the past 240 hour(s))  SARS CORONAVIRUS 2 (TAT 6-24 HRS) Nasopharyngeal Nasopharyngeal Swab     Status: None   Collection Time: 10/03/20 12:58 PM   Specimen: Nasopharyngeal Swab  Result  Value Ref Range Status   SARS Coronavirus 2 NEGATIVE NEGATIVE Final    Comment: (NOTE) SARS-CoV-2 target nucleic acids are NOT DETECTED.  The SARS-CoV-2 RNA is generally detectable in upper and lower respiratory specimens during the acute phase of infection. Negative results do not preclude SARS-CoV-2 infection, do not rule out co-infections with other pathogens, and should not be used as the sole basis for treatment or other patient management decisions. Negative  results must be combined with clinical observations, patient history, and epidemiological information. The expected result is Negative.  Fact Sheet for Patients: SugarRoll.be  Fact Sheet for Healthcare Providers: https://www.woods-mathews.com/  This test is not yet approved or cleared by the [place] FDA and  has been authorized for detection and/or diagnosis of SARS-CoV-2 by FDA under an Emergency Use Authorization (EUA). This EUA will remain  in effect (meaning this test can be used) for the duration of the COVID-19 declaration under Se ction 564(b)(1) of the Act, 21 U.S.C. section 360bbb-3(b)(1), unless the authorization is terminated or revoked sooner.  Performed at [facility], [place] [address] [place], [place] [address]      Labs: Basic Metabolic Panel: Recent Labs  Lab 10/03/20 1115 10/04/20 0631 10/05/20 0555  NA 137 135 131*  K 3.8 2.9* 4.2  CL 98 96* 97*  CO2 24 27 27   GLUCOSE 89 88 89  BUN 15 23 28*  CREATININE 1.21* 1.23* 1.12*  CALCIUM 7.8* 8.0* 8.3*   Liver Function Tests: Recent Labs  Lab 10/03/20 1115  AST 19  ALT 9  ALKPHOS 167*  BILITOT 1.2  PROT 8.5*  ALBUMIN 4.3   No results for input(s): LIPASE, AMYLASE in the last 168 hours. No results for input(s): AMMONIA in the last 168 hours. CBC: Recent Labs  Lab 10/03/20 1115 10/04/20 0631 10/05/20 0555  WBC 5.5 3.8* 3.4*  NEUTROABS 4.1  --   --   HGB 13.6 11.2* 11.1*  HCT 40.9 33.5* 33.3*  MCV 94.2 92.3 93.5  PLT 160 138* 140*   Cardiac Enzymes: No results for input(s): CKTOTAL, CKMB, CKMBINDEX, TROPONINI in the last 168 hours. BNP: BNP (last 3 results) No results for input(s): BNP in the last 8760 hours.  ProBNP (last 3 results) No results for input(s): PROBNP in the last 8760 hours.  CBG: No results for input(s): GLUCAP in the last 168 hours.     Signed:  Domenic Polite MD.  [facility] 10/08/2020, 3:14 PM

## 2020-10-17 LAB — COOXEMETRY PANEL: Carboxyhemoglobin: 12.6 % (ref 0.5–1.5)

## 2020-10-27 MED FILL — CLONAZEPAM 1 MG TABLET: ORAL | 30 days supply | Qty: 60 | Fill #5

## 2020-10-31 MED ORDER — LOSARTAN 100 MG TABLET
ORAL_TABLET | 3 refills | 0 days | Status: CP
Start: 2020-10-31 — End: ?

## 2020-11-07 MED ORDER — ALBUTEROL SULFATE HFA 90 MCG/ACTUATION AEROSOL INHALER
RESPIRATORY_TRACT | 0 refills | 0 days | Status: CP
Start: 2020-11-07 — End: ?

## 2020-11-14 ENCOUNTER — Ambulatory Visit: Admit: 2020-11-14 | Discharge: 2020-11-15 | Payer: MEDICARE

## 2020-11-20 ENCOUNTER — Institutional Professional Consult (permissible substitution): Admit: 2020-11-20 | Discharge: 2020-11-21 | Payer: MEDICARE | Attending: Clinical | Primary: Clinical

## 2020-11-20 DIAGNOSIS — Z0000 Encounter for general adult medical examination without abnormal findings: Principal | ICD-10-CM

## 2020-11-22 DIAGNOSIS — G40219 Localization-related (focal) (partial) symptomatic epilepsy and epileptic syndromes with complex partial seizures, intractable, without status epilepticus: Principal | ICD-10-CM

## 2020-11-22 MED ORDER — CLONAZEPAM 1 MG TABLET
ORAL_TABLET | Freq: Two times a day (BID) | ORAL | 1 refills | 90.00000 days | Status: CP
Start: 2020-11-22 — End: ?
  Filled 2020-11-27: qty 60, 30d supply, fill #0

## 2020-11-26 MED ORDER — FUROSEMIDE 40 MG TABLET
ORAL_TABLET | 1 refills | 0 days | Status: CP
Start: 2020-11-26 — End: ?

## 2020-12-06 ENCOUNTER — Ambulatory Visit: Admit: 2020-12-06 | Discharge: 2020-12-07 | Payer: MEDICARE

## 2020-12-06 DIAGNOSIS — G40219 Localization-related (focal) (partial) symptomatic epilepsy and epileptic syndromes with complex partial seizures, intractable, without status epilepticus: Principal | ICD-10-CM

## 2020-12-06 MED ORDER — GABAPENTIN 300 MG CAPSULE
ORAL_CAPSULE | Freq: Two times a day (BID) | ORAL | 4 refills | 90.00000 days | Status: CP
Start: 2020-12-06 — End: 2021-12-06

## 2020-12-06 MED ORDER — LEVETIRACETAM 500 MG TABLET
ORAL_TABLET | 4 refills | 0 days | Status: CP
Start: 2020-12-06 — End: ?

## 2020-12-06 MED ORDER — LACOSAMIDE 150 MG TABLET
ORAL_TABLET | Freq: Two times a day (BID) | ORAL | 1 refills | 90.00000 days | Status: CP
Start: 2020-12-06 — End: ?

## 2020-12-06 MED ORDER — CLONAZEPAM 1 MG TABLET
ORAL_TABLET | Freq: Two times a day (BID) | ORAL | 5 refills | 30 days | Status: CP
Start: 2020-12-06 — End: ?
  Filled 2021-01-08: qty 60, 30d supply, fill #0

## 2020-12-07 ENCOUNTER — Ambulatory Visit: Admit: 2020-12-07 | Discharge: 2020-12-08 | Payer: MEDICARE

## 2020-12-10 ENCOUNTER — Ambulatory Visit
Admit: 2020-12-10 | Discharge: 2020-12-11 | Payer: MEDICARE | Attending: Student in an Organized Health Care Education/Training Program | Primary: Student in an Organized Health Care Education/Training Program

## 2020-12-10 DIAGNOSIS — Z0000 Encounter for general adult medical examination without abnormal findings: Principal | ICD-10-CM

## 2020-12-10 DIAGNOSIS — I2510 Atherosclerotic heart disease of native coronary artery without angina pectoris: Principal | ICD-10-CM

## 2020-12-10 DIAGNOSIS — M791 Myalgia, unspecified site: Principal | ICD-10-CM

## 2020-12-10 DIAGNOSIS — Z720 Tobacco use: Principal | ICD-10-CM

## 2020-12-10 MED ORDER — ROSUVASTATIN 20 MG TABLET
ORAL_TABLET | Freq: Every evening | ORAL | 3 refills | 90 days | Status: CP
Start: 2020-12-10 — End: 2021-12-10

## 2020-12-17 MED ORDER — ALBUTEROL SULFATE HFA 90 MCG/ACTUATION AEROSOL INHALER
RESPIRATORY_TRACT | 3 refills | 0 days | Status: CP
Start: 2020-12-17 — End: ?

## 2021-01-18 MED ORDER — SPIRONOLACTONE 25 MG TABLET
ORAL_TABLET | Freq: Every day | ORAL | 1 refills | 90 days
Start: 2021-01-18 — End: 2021-04-18

## 2021-01-20 MED ORDER — SPIRONOLACTONE 25 MG TABLET
ORAL_TABLET | Freq: Every day | ORAL | 1 refills | 90 days | Status: CP
Start: 2021-01-20 — End: 2021-04-20

## 2021-01-31 MED FILL — CLONAZEPAM 1 MG TABLET: ORAL | 30 days supply | Qty: 60 | Fill #1

## 2021-03-13 MED FILL — CLONAZEPAM 1 MG TABLET: ORAL | 30 days supply | Qty: 60 | Fill #2

## 2021-04-04 DIAGNOSIS — I10 Essential (primary) hypertension: Principal | ICD-10-CM

## 2021-04-04 MED ORDER — AMLODIPINE 10 MG TABLET
ORAL_TABLET | 1 refills | 0 days | Status: CP
Start: 2021-04-04 — End: ?

## 2021-04-04 MED ORDER — FUROSEMIDE 40 MG TABLET
ORAL_TABLET | 1 refills | 0 days | Status: CP
Start: 2021-04-04 — End: ?

## 2021-04-05 ENCOUNTER — Other Ambulatory Visit: Payer: Self-pay

## 2021-04-05 ENCOUNTER — Ambulatory Visit
Admission: EM | Admit: 2021-04-05 | Discharge: 2021-04-05 | Disposition: A | Discharge status: Home / Self Care | Payer: Medicare HMO | Attending: Sports Medicine | Admitting: Sports Medicine

## 2021-04-05 ENCOUNTER — Encounter: Payer: Self-pay | Admitting: Emergency Medicine

## 2021-04-05 DIAGNOSIS — R059 Cough, unspecified: Secondary | ICD-10-CM | POA: Diagnosis not present

## 2021-04-05 DIAGNOSIS — R0981 Nasal congestion: Secondary | ICD-10-CM | POA: Diagnosis not present

## 2021-04-05 DIAGNOSIS — Z20822 Contact with and (suspected) exposure to covid-19: Secondary | ICD-10-CM | POA: Insufficient documentation

## 2021-04-05 HISTORY — DX: Malignant (primary) neoplasm, unspecified: C80.1

## 2021-04-05 NOTE — Discharge Instructions (Addendum)
As we discussed, we obtained a COVID test and it was sent to the hospital.  Someone will contact you if your test result is positive.  In the meantime, please go home and isolate.  If you are positive you will need to quarantine per the current CDC guidelines.  If you are positive someone will call you in a COVID medication.  You can also follow along with the app on your computer or phone called MyChart. Please see educational handouts. Plenty of rest, plenty fluids, Tylenol or Motrin for any fever or discomfort. You can take Delsym or Robitussin over-the-counter for your cough.  I did not prescribe you a cough medicine.  Since you are not having significant issues with postnasal drip I did not prescribe you a nasal steroid either.  This may change in the future and your primary care doc may elect to prescribe that. Continue take your medicines as scheduled. If your symptoms persist please contact your primary care office. If your symptoms worsen in any way please call 911 and go to the nearest emergency room.

## 2021-04-05 NOTE — ED Triage Notes (Signed)
Patient c/o cough and congestion that started on Wed.  Patient states that her sister tested positive for COVID on Tuesday.

## 2021-04-05 NOTE — ED Provider Notes (Signed)
[facility]    CSN: [id number] Arrival date & time: 04/05/21  1351      History   Chief Complaint Chief Complaint  Patient presents with   Covid Exposure   Cough    HPI Anne Phillips is a 70 y.o. female.   70 year old female who presents for evaluation of cough and congestion that began 3 days ago.  She denies any fever shakes chills.  No nausea vomiting or diarrhea.  She said her sister tested positive for COVID the day before she began having symptoms.  She has been using over-the-counter meds.  She has several medical conditions and is on several medications.  She has coronary artery disease, has had 2 MIs, has hypertension and hyperlipidemia and a seizure disorder.  She would be a good candidate for the antiviral COVID medication if her test result does come back positive.  She presents to the urgent care hoping to get a test for COVID.  She has not tested at home.  She denies any chest pain or shortness of breath currently.  No sore throat or URI symptoms.  No ear pain.  The cough is dry and irritating and nonproductive.  She says it is worse in the afternoon and can last for about 3 or 4 hours.  She says it is fine in the morning and its fine in the evening and at nighttime.  She denies any reflux symptoms.  She has no sinus congestion or postnasal drip.  Her primary care needs are met by [facility] family medicine in [place].  They were unable to see her today.  No red flag signs or symptoms elicited on history.    Past Medical History:  Diagnosis Date   Cancer [facility])    Coronary artery disease    Hyperlipidemia    Hypertension    Seizures ([place])    Thyroid disease     Patient Active Problem List   Diagnosis Date Noted   Carbon monoxide poisoning 10/03/2020   Fall at home, initial encounter 10/03/2020   Seizures ([place])    Hypertension    Depression 08/27/2015   Acute delirium 08/27/2015   Depression, major, single episode, moderate (HCC)    (HFpEF) heart  failure with preserved ejection fraction [facility])    Disorientation    Pyrexia    Hypoxia    Non-traumatic rhabdomyolysis    Coronary artery disease involving native coronary artery of native heart without angina pectoris    Sepsis ([place]) 08/20/2015   NSTEMI (non-ST elevated myocardial infarction) ([place]) 08/20/2015   Rhabdomyolysis 08/20/2015    Past Surgical History:  Procedure Laterality Date   MASTECTOMY      OB History   No obstetric history on file.      Home Medications    Prior to Admission medications   Medication Sig Start Date End Date Taking? Authorizing Provider  amLODipine (NORVASC) 10 MG tablet Take 10 mg by mouth daily.   Yes [provider]  atorvastatin (LIPITOR) 40 MG tablet Take 40 mg by mouth daily.   Yes [provider]  carvedilol (COREG) 12.5 MG tablet Take 12.5 mg by mouth 2 (two) times daily with a meal.   Yes [provider]  empagliflozin (JARDIANCE) 10 MG TABS tablet Take 10 mg by mouth daily.   Yes [provider]  furosemide (LASIX) 40 MG tablet Take 40 mg by mouth daily.   Yes [provider]  gabapentin (NEURONTIN) 300 MG capsule Take 1 capsule (300 mg  total) by mouth 2 (two) times daily. 10/05/20  Yes Domenic Polite, MD  Lacosamide (VIMPAT) 150 MG TABS Take 150 mg by mouth in the morning and at bedtime.   Yes [provider]  levETIRAcetam (KEPPRA) 500 MG tablet Take 750 mg by mouth 3 (three) times daily. Take 1 and 1/2 tablets three times daily   Yes [provider]  losartan (COZAAR) 100 MG tablet Take 100 mg by mouth daily.   Yes [provider]  spironolactone (ALDACTONE) 25 MG tablet Take 12.5 mg by mouth daily.   Yes [provider]  acetaminophen (TYLENOL) 325 MG tablet Take 2 tablets (650 mg total) by mouth every 6 (six) hours as needed for mild pain (or Fever >/= 101). 10/05/20   Domenic Polite, MD    Family History Family History  Problem Relation Age of Onset    Hypertension Other     Social History Social History   Tobacco Use   Smoking status: Every Day    Packs/day: 1.00    Types: Cigarettes   Smokeless tobacco: Never  Vaping Use   Vaping Use: Never used  Substance Use Topics   Alcohol use: No   Drug use: Never     Allergies   Iodinated diagnostic agents   Review of Systems Review of Systems  Constitutional:  Negative for activity change, appetite change, chills, diaphoresis, fatigue and fever.  HENT:  Positive for congestion. Negative for ear pain, postnasal drip, rhinorrhea, sinus pressure, sinus pain, sneezing and sore throat.   Eyes:  Negative for pain.  Respiratory:  Positive for cough. Negative for chest tightness, shortness of breath and wheezing.   Cardiovascular:  Negative for chest pain and palpitations.  Gastrointestinal:  Negative for abdominal pain, diarrhea, nausea and vomiting.  Genitourinary:  Negative for dysuria.  Musculoskeletal:  Negative for back pain, myalgias and neck pain.  Skin:  Negative for color change, pallor, rash and wound.  Neurological:  Negative for dizziness, syncope, light-headedness, numbness and headaches.  All other systems reviewed and are negative.   Physical Exam Triage Vital Signs ED Triage Vitals  Enc Vitals Group     BP 04/05/21 1420 121/82     Pulse Rate 04/05/21 1420 83     Resp 04/05/21 1420 14     Temp 04/05/21 1420 98.4 F (36.9 C)     Temp Source 04/05/21 1420 Oral     SpO2 04/05/21 1420 100 %     Weight 04/05/21 1414 127 lb (57.6 kg)     Height 04/05/21 1414 5' 2" (1.575 m)     Head Circumference --      Peak Flow --      Pain Score 04/05/21 1414 0     Pain Loc --      Pain Edu? --      Excl. in [place]? --    No data found.  Updated Vital Signs BP 121/82 (BP Location: Right Arm)   Pulse 83   Temp 98.4 F (36.9 C) (Oral)   Resp 14   Ht 5' 2" (1.575 m)   Wt 57.6 kg   SpO2 100%   BMI 23.23 kg/m   Visual Acuity Right Eye Distance:   Left Eye Distance:    Bilateral Distance:    Right Eye Near:   Left Eye Near:    Bilateral Near:     Physical Exam Vitals and nursing note reviewed.  Constitutional:      General: She is not in acute  distress.    Appearance: Normal appearance. She is obese. She is not ill-appearing, toxic-appearing or diaphoretic.  HENT:     Head: Normocephalic and atraumatic.     Right Ear: Tympanic membrane normal.     Left Ear: Tympanic membrane normal.     Nose: Congestion present. No rhinorrhea.     Mouth/Throat:     Mouth: Mucous membranes are moist.     Pharynx: No oropharyngeal exudate or posterior oropharyngeal erythema.  Eyes:     General: No scleral icterus.       Right eye: No discharge.        Left eye: No discharge.     Extraocular Movements: Extraocular movements intact.     Conjunctiva/sclera: Conjunctivae normal.     Pupils: Pupils are equal, round, and reactive to light.  Cardiovascular:     Rate and Rhythm: Normal rate and regular rhythm.     Pulses: Normal pulses.     Heart sounds: Normal heart sounds. No murmur heard.   No friction rub. No gallop.  Pulmonary:     Effort: Pulmonary effort is normal.     Breath sounds: Normal breath sounds. No stridor. No wheezing, rhonchi or rales.  Musculoskeletal:     Cervical back: Normal range of motion and neck supple.  Skin:    General: Skin is warm and dry.     Capillary Refill: Capillary refill takes less than 2 seconds.     Coloration: Skin is not jaundiced.     Findings: No bruising, erythema or rash.  Neurological:     General: No focal deficit present.     Mental Status: She is alert and oriented to person, place, and time.     UC Treatments / Results  Labs (all labs ordered are listed, but only abnormal results are displayed) Labs Reviewed  SARS CORONAVIRUS 2 (TAT 6-24 HRS)    EKG   Radiology No results found.  Procedures Procedures (including critical care time)  Medications Ordered in UC Medications - No data to  display  Initial Impression / Assessment and Plan / UC Course  I have reviewed the triage vital signs and the nursing notes.  Pertinent labs & imaging results that were available during my care of the patient were reviewed by me and considered in my medical decision making (see chart for details).  Clinical impression: 1.  Cough 2.  Nasal congestion 3.  Close contact with a family member who has COVID-19.  Treatment plan: 1.  The findings and treatment plan were discussed in detail with the patient.  Patient was in agreement. 2.  Recommended getting a COVID test.  We sent that to the hospital.  Someone will contact her with a positive result.  In the interim I have asked her to go ahead and isolate.  If she is positive she will need to quarantine per the current CDC guidelines. 3.  Given her comorbidities, if she is positive she would be a good candidate for the antiviral COVID medication.  Her creatinine has been elevated, so I would recommend molnupivir for 5 days.  This is only if she is positive. 4.  Plenty of rest, plenty fluids, Tylenol or Motrin for any fever or discomfort. 5.  Educational handouts provided. 6.  Recommended Delsym and/or Robitussin over-the-counter for cough.  I did not want to suppress her cough so no cough medicine was prescribed. 7.  Want her to continue with her medicines as scheduled. 8.  If her symptoms persist  I want her to contact her PCP office. 9.  If they worsen in any way I want her to call 911 or go to the nearest ER.  She voiced verbal understanding. 10.  She was discharged in stable condition and will follow-up here as needed.    Final Clinical Impressions(s) / UC Diagnoses   Final diagnoses:  Cough  Nasal congestion  Close exposure to COVID-19 virus     Discharge Instructions      As we discussed, we obtained a COVID test and it was sent to the hospital.  Someone will contact you if your test result is positive.  In the meantime, please go  home and isolate.  If you are positive you will need to quarantine per the current CDC guidelines.  If you are positive someone will call you in a COVID medication.  You can also follow along with the app on your computer or phone called MyChart. Please see educational handouts. Plenty of rest, plenty fluids, Tylenol or Motrin for any fever or discomfort. You can take Delsym or Robitussin over-the-counter for your cough.  I did not prescribe you a cough medicine.  Since you are not having significant issues with postnasal drip I did not prescribe you a nasal steroid either.  This may change in the future and your primary care doc may elect to prescribe that. Continue take your medicines as scheduled. If your symptoms persist please contact your primary care office. If your symptoms worsen in any way please call 911 and go to the nearest emergency room.     ED Prescriptions   None    PDMP not reviewed this encounter.   Verda Cumins, MD 04/05/21 [phone]

## 2021-04-06 LAB — SARS CORONAVIRUS 2 (TAT 6-24 HRS): SARS Coronavirus 2: POSITIVE — AB

## 2021-04-07 ENCOUNTER — Telehealth: Payer: Self-pay

## 2021-04-07 NOTE — Telephone Encounter (Signed)
Pt called to discuss Covid results, pt voiced understanding.

## 2021-04-10 ENCOUNTER — Encounter: Payer: Self-pay | Admitting: Emergency Medicine

## 2021-04-10 ENCOUNTER — Emergency Department: Payer: Medicare HMO

## 2021-04-10 ENCOUNTER — Emergency Department
Admission: EM | Admit: 2021-04-10 | Discharge: 2021-04-10 | Disposition: A | Discharge status: Home / Self Care | Payer: Medicare HMO | Attending: Emergency Medicine | Admitting: Emergency Medicine

## 2021-04-10 ENCOUNTER — Other Ambulatory Visit: Payer: Self-pay

## 2021-04-10 DIAGNOSIS — R059 Cough, unspecified: Secondary | ICD-10-CM | POA: Diagnosis present

## 2021-04-10 DIAGNOSIS — Z859 Personal history of malignant neoplasm, unspecified: Secondary | ICD-10-CM | POA: Diagnosis not present

## 2021-04-10 DIAGNOSIS — H538 Other visual disturbances: Secondary | ICD-10-CM | POA: Diagnosis not present

## 2021-04-10 DIAGNOSIS — Z79899 Other long term (current) drug therapy: Secondary | ICD-10-CM | POA: Diagnosis not present

## 2021-04-10 DIAGNOSIS — R0602 Shortness of breath: Secondary | ICD-10-CM

## 2021-04-10 DIAGNOSIS — U071 COVID-19: Secondary | ICD-10-CM | POA: Insufficient documentation

## 2021-04-10 DIAGNOSIS — E860 Dehydration: Secondary | ICD-10-CM | POA: Insufficient documentation

## 2021-04-10 DIAGNOSIS — R531 Weakness: Secondary | ICD-10-CM | POA: Diagnosis not present

## 2021-04-10 DIAGNOSIS — F17210 Nicotine dependence, cigarettes, uncomplicated: Secondary | ICD-10-CM | POA: Diagnosis not present

## 2021-04-10 DIAGNOSIS — I119 Hypertensive heart disease without heart failure: Secondary | ICD-10-CM | POA: Diagnosis not present

## 2021-04-10 DIAGNOSIS — I2510 Atherosclerotic heart disease of native coronary artery without angina pectoris: Secondary | ICD-10-CM | POA: Diagnosis not present

## 2021-04-10 LAB — BASIC METABOLIC PANEL
Anion gap: 14 (ref 5–15)
BUN: 27 mg/dL — ABNORMAL HIGH (ref 8–23)
CO2: 20 mmol/L — ABNORMAL LOW (ref 22–32)
Calcium: 7.6 mg/dL — ABNORMAL LOW (ref 8.9–10.3)
Chloride: 96 mmol/L — ABNORMAL LOW (ref 98–111)
Creatinine, Ser: 1.5 mg/dL — ABNORMAL HIGH (ref 0.44–1.00)
GFR, Estimated: 37 mL/min — ABNORMAL LOW (ref >60)
Glucose, Bld: 96 mg/dL (ref 70–99)
Potassium: 3.4 mmol/L — ABNORMAL LOW (ref 3.5–5.1)
Sodium: 130 mmol/L — ABNORMAL LOW (ref 135–145)

## 2021-04-10 LAB — CBC
HCT: 30.1 % — ABNORMAL LOW (ref 36.0–46.0)
Hemoglobin: 10.7 g/dL — ABNORMAL LOW (ref 12.0–15.0)
MCH: 31.6 pg (ref 26.0–34.0)
MCHC: 35.5 g/dL (ref 30.0–36.0)
MCV: 88.8 fL (ref 80.0–100.0)
Platelets: 151 10*3/uL (ref 150–400)
RBC: 3.39 MIL/uL — ABNORMAL LOW (ref 3.87–5.11)
RDW: 14 % (ref 11.5–15.5)
WBC: 4.7 10*3/uL (ref 4.0–10.5)
nRBC: 0 % (ref 0.0–0.2)

## 2021-04-10 LAB — TROPONIN I (HIGH SENSITIVITY)
Troponin I (High Sensitivity): 18 ng/L — ABNORMAL HIGH (ref <18)
Troponin I (High Sensitivity): 17 ng/L (ref <18)

## 2021-04-10 IMAGING — DX DG CHEST 1V PORT
1 series · 1 of 1 positions shown · non-contrast
Comparison: [DATE]

CLINICAL DATA: Generalized weakness.  COVID.

EXAM:
PORTABLE CHEST 1 VIEW

[chest ap]
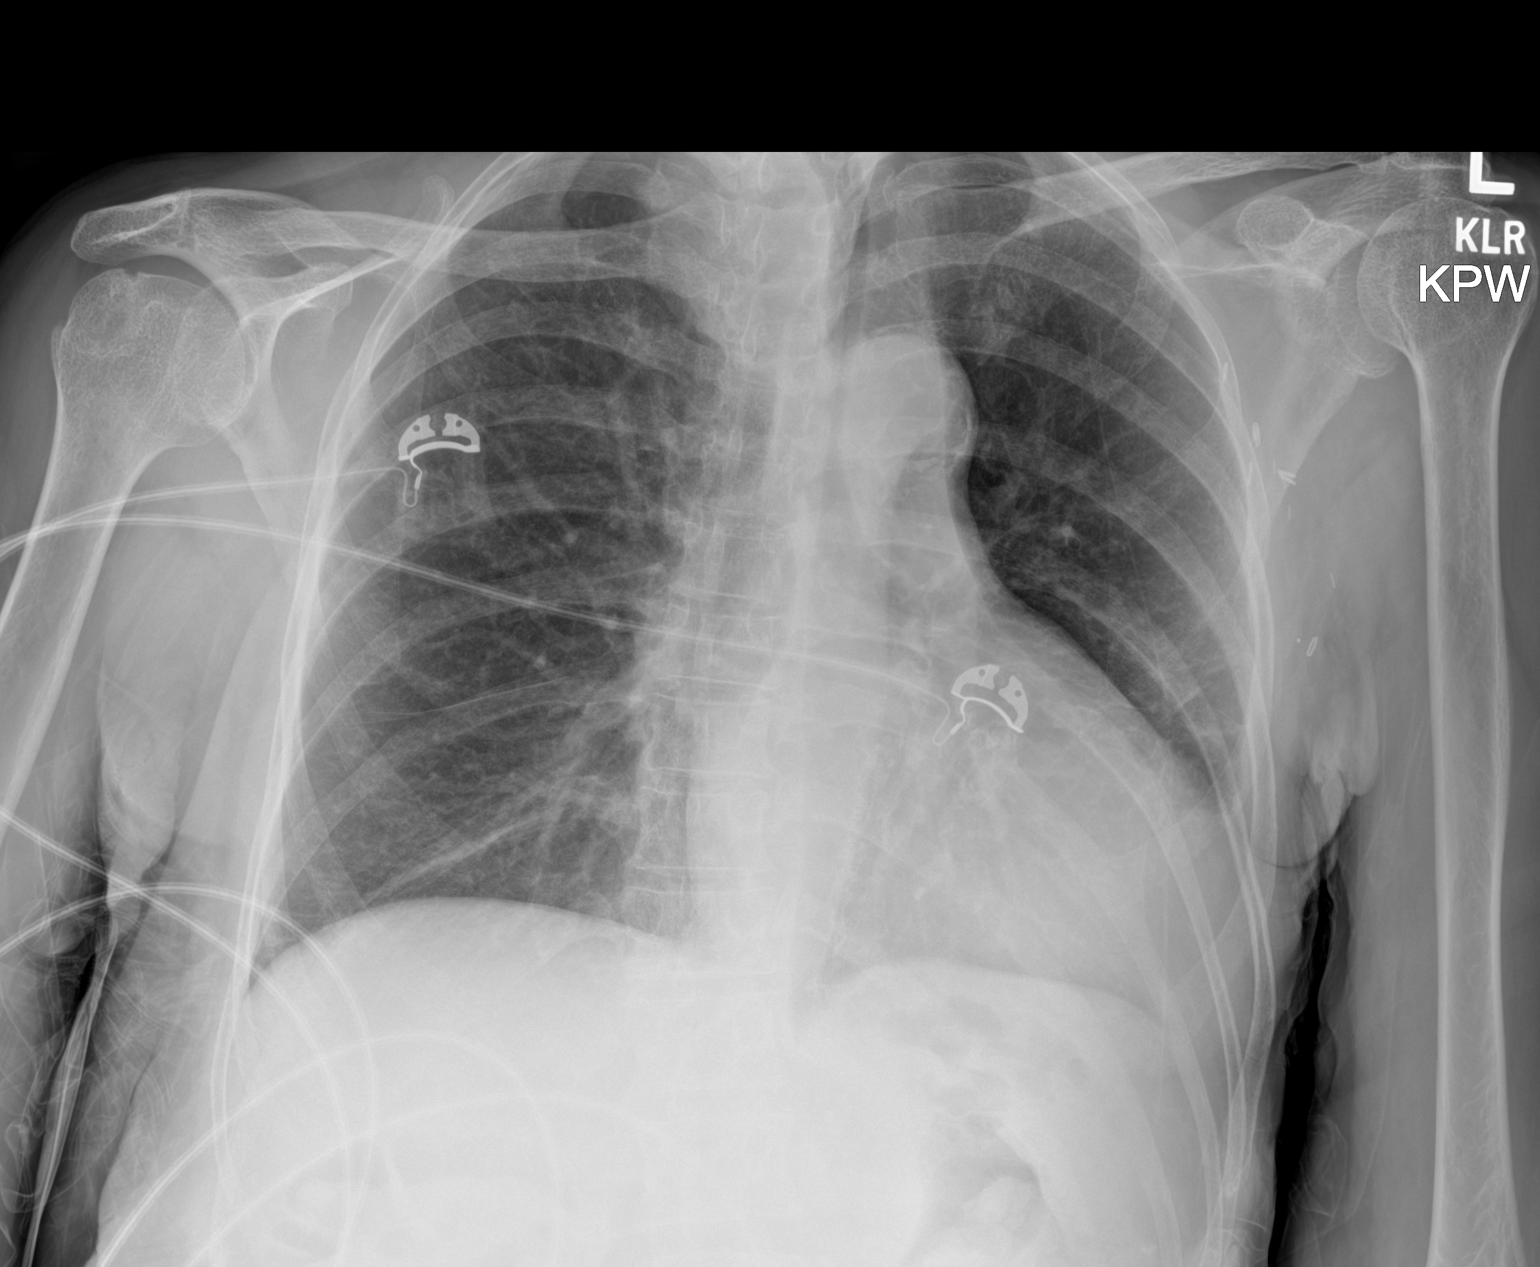

[1 of 1 positions shown; findings below may reference images not displayed]

FINDINGS: Cardiomegaly. No confluent airspace opacities or effusions. No acute
bony abnormality.
IMPRESSION: Cardiomegaly.  No active disease.

## 2021-04-10 MED ORDER — METHYLPREDNISOLONE SODIUM SUCC 125 MG IJ SOLR
125.0000 mg | Freq: Once | INTRAMUSCULAR | Status: AC
  Administered 2021-04-10: 125 mg via INTRAVENOUS
  Filled 2021-04-10: qty 2

## 2021-04-10 MED ORDER — PREDNISONE 50 MG PO TABS: 50.0000 mg | ORAL_TABLET | Freq: Every day | ORAL | 0 refills | Status: AC

## 2021-04-10 MED ORDER — ALBUTEROL SULFATE HFA 108 (90 BASE) MCG/ACT IN AERS
2.0000 | INHALATION_SPRAY | Freq: Once | RESPIRATORY_TRACT | Status: AC
  Administered 2021-04-10: 2 via RESPIRATORY_TRACT
  Filled 2021-04-10: qty 6.7

## 2021-04-10 MED ORDER — LACTATED RINGERS IV BOLUS
1000.0000 mL | Freq: Once | INTRAVENOUS | Status: AC
  Administered 2021-04-10: 1000 mL via INTRAVENOUS

## 2021-04-10 MED ORDER — ACETAMINOPHEN 500 MG PO TABS
1000.0000 mg | ORAL_TABLET | Freq: Once | ORAL | Status: DC
  Filled 2021-04-10: qty 2

## 2021-04-10 NOTE — ED Notes (Signed)
MD at bedside. 

## 2021-04-10 NOTE — ED Provider Notes (Signed)
[facility] Emergency Department Provider Note ____________________________________________   Event Date/Time   First MD Initiated Contact with Patient 04/10/21 1659     (approximate)  I have reviewed the triage vital signs and the nursing notes.  HISTORY  Chief Complaint Weakness   HPI Anne Phillips is a 70 y.o. femalewho presents to the ED for evaluation of generalized weakness.  Chart review indicates patient was diagnosed with COVID-19 last week through a local urgent care. Otherwise history of CAD, seizure disorder, COPD and continued tobacco abuse.  Patient presents to the ED for evaluation of increasing generalized weakness.  She reports that she has been feeling poorly in a generalized fashion with poor p.o. intake, generalized weakness and increased nonproductive cough from her baseline over the past 1 week.  She reports difficulty with typical tasks, such as getting the garbage cans from the curb to her house yesterday.  She reports he lives at home with her sister, and patient ambulates with a walker.  Sister is also sick with COVID-19.  Patient further reports an episode of generalized blurry vision when she got up this morning from a supine position, now since resolved.  Denies any headache or syncopal episodes.  Reports her vision is more normal now.  She reports a mechanical fall that occurred yesterday due to her weakness in her leg giving out.  She reports scraping her left heel, but denies any additional injuries. She reports that she has not eaten any food today due to poor appetite, but is now hungry and requesting food.  Past Medical History:  Diagnosis Date   Cancer [facility])    Coronary artery disease    Hyperlipidemia    Hypertension    Seizures ([place])    Thyroid disease     Patient Active Problem List   Diagnosis Date Noted   Carbon monoxide poisoning 10/03/2020   Fall at home, initial encounter 10/03/2020   Seizures ([place])     Hypertension    Depression 08/27/2015   Acute delirium 08/27/2015   Depression, major, single episode, moderate (HCC)    (HFpEF) heart failure with preserved ejection fraction [facility])    Disorientation    Pyrexia    Hypoxia    Non-traumatic rhabdomyolysis    Coronary artery disease involving native coronary artery of native heart without angina pectoris    Sepsis ([place]) 08/20/2015   NSTEMI (non-ST elevated myocardial infarction) ([place]) 08/20/2015   Rhabdomyolysis 08/20/2015    Past Surgical History:  Procedure Laterality Date   MASTECTOMY      Prior to Admission medications   Medication Sig Start Date End Date Taking? Authorizing Provider  acetaminophen (TYLENOL) 325 MG tablet Take 2 tablets (650 mg total) by mouth every 6 (six) hours as needed for mild pain (or Fever >/= 101). 10/05/20   Domenic Polite, MD  amLODipine (NORVASC) 10 MG tablet Take 10 mg by mouth daily.    [provider]  atorvastatin (LIPITOR) 40 MG tablet Take 40 mg by mouth daily.    [provider]  carvedilol (COREG) 12.5 MG tablet Take 12.5 mg by mouth 2 (two) times daily with a meal.    [provider]  empagliflozin (JARDIANCE) 10 MG TABS tablet Take 10 mg by mouth daily.    [provider]  furosemide (LASIX) 40 MG tablet Take 40 mg by mouth daily.    [provider]  gabapentin (NEURONTIN) 300 MG capsule Take 1 capsule (300 mg total) by mouth 2 (two)  times daily. 10/05/20   Domenic Polite, MD  Lacosamide (VIMPAT) 150 MG TABS Take 150 mg by mouth in the morning and at bedtime.    [provider]  levETIRAcetam (KEPPRA) 500 MG tablet Take 750 mg by mouth 3 (three) times daily. Take 1 and 1/2 tablets three times daily    [provider]  losartan (COZAAR) 100 MG tablet Take 100 mg by mouth daily.    [provider]  spironolactone (ALDACTONE) 25 MG tablet Take 12.5 mg by mouth daily.    [provider]    Allergies Iodinated  diagnostic agents  Family History  Problem Relation Age of Onset   Hypertension Other     Social History Social History   Tobacco Use   Smoking status: Every Day    Packs/day: 1.00    Types: Cigarettes   Smokeless tobacco: Never  Vaping Use   Vaping Use: Never used  Substance Use Topics   Alcohol use: No   Drug use: Never    Review of Systems  Constitutional: No fever/chills.  Positive generalized weakness Eyes: No visual changes. ENT: No sore throat. Cardiovascular: Denies chest pain. Respiratory: Denies shortness of breath.  Positive for increased nonproductive cough. Gastrointestinal: No abdominal pain.  No nausea, no vomiting.  No diarrhea.  No constipation. Genitourinary: Negative for dysuria. Musculoskeletal: Negative for back pain. Skin: Negative for rash. Neurological: Negative for headaches, focal weakness or numbness.  ____________________________________________   PHYSICAL EXAM:  VITAL SIGNS: Vitals:   04/10/21 1900 04/10/21 1918  BP: 97/67 (!) 113/58  Pulse:  66  Resp: 14 16  Temp:    SpO2:  100%    Constitutional: Alert and oriented. Well appearing and in no acute distress.  Occasional coughing episodes during our conversation. Eyes: Conjunctivae are normal. PERRL. EOMI. Head: Atraumatic. Nose: No congestion/rhinnorhea. Mouth/Throat: Mucous membranes are moist.  Oropharynx non-erythematous. Neck: No stridor. No cervical spine tenderness to palpation. Cardiovascular: Normal rate, regular rhythm. Grossly normal heart sounds.  Good peripheral circulation. Respiratory: Minimal tachypnea to the low 20s, no further evidence of distress.  Diffuse and scattered expiratory wheezes with slight decrease in air movement throughout.  No focal features. Gastrointestinal: Soft , nondistended, nontender to palpation. No CVA tenderness. Musculoskeletal: No lower extremity tenderness nor edema.  No joint effusions.  Small, 1 cm, superficial abrasion to the left  posterior aspect of the calcaneus.  No discrete laceration. Palpation of all 4 extremities otherwise without evidence of deformity or acute trauma. Neurologic:  Normal speech and language. No gross focal neurologic deficits are appreciated.  Skin:  Skin is warm, dry and intact. No rash noted. Psychiatric: Mood and affect are normal. Speech and behavior are normal.  ____________________________________________   LABS (all labs ordered are listed, but only abnormal results are displayed)  Labs Reviewed  BASIC METABOLIC PANEL - Abnormal; Notable for the following components:      Result Value   Sodium 130 (*)    Potassium 3.4 (*)    Chloride 96 (*)    CO2 20 (*)    BUN 27 (*)    Creatinine, Ser 1.50 (*)    Calcium 7.6 (*)    GFR, Estimated 37 (*)    All other components within normal limits  CBC - Abnormal; Notable for the following components:   RBC 3.39 (*)    Hemoglobin 10.7 (*)    HCT 30.1 (*)    All other components within normal limits  TROPONIN I (HIGH SENSITIVITY) - Abnormal;  Notable for the following components:   Troponin I (High Sensitivity) 18 (*)    All other components within normal limits  URINALYSIS, COMPLETE (UACMP) WITH MICROSCOPIC  CBG MONITORING, ED  TROPONIN I (HIGH SENSITIVITY)   ____________________________________________  12 Lead EKG  Sinus rhythm with a rate of 77 bpm.  Normal axis.  First-degree AV block with PR interval of 244 ms.  Otherwise normal intervals.  Lateral and inferior T wave inversions without STEMI criteria. Similar to EKG from February with lateral and inferior ST changes. ____________________________________________  RADIOLOGY  ED MD interpretation:  1vw CXR reviewed by me with cardiomegaly but no acute cardiopulmonary pathology.  Official radiology report(s): DG Chest Port 1 View  Result Date: 04/10/2021 CLINICAL DATA:  Generalized weakness.  COVID. EXAM: PORTABLE CHEST 1 VIEW COMPARISON:  10/03/2020 FINDINGS: Cardiomegaly.  No confluent airspace opacities or effusions. No acute bony abnormality. IMPRESSION: Cardiomegaly.  No active disease. Electronically Signed   By: Rolm Baptise M.D.   On: 04/10/2021 18:07    ____________________________________________   PROCEDURES and INTERVENTIONS  Procedure(s) performed (including Critical Care):  .1-3 Lead EKG Interpretation  Date/Time: 04/10/2021 5:47 PM Performed by: Vladimir Crofts, MD Authorized by: Vladimir Crofts, MD     Interpretation: normal     ECG rate:  76   ECG rate assessment: normal     Rhythm: sinus rhythm     Ectopy: none     Conduction: normal    Medications  acetaminophen (TYLENOL) tablet 1,000 mg (1,000 mg Oral Not Given 04/10/21 1801)  lactated ringers bolus 1,000 mL (1,000 mLs Intravenous New Bag/Given 04/10/21 1800)  albuterol (VENTOLIN HFA) 108 (90 Base) MCG/ACT inhaler 2 puff (2 puffs Inhalation Given 04/10/21 1827)  methylPREDNISolone sodium succinate (SOLU-MEDROL) 125 mg/2 mL injection 125 mg (125 mg Intravenous Given 04/10/21 1801)    ____________________________________________   MDM / ED COURSE   70 year old female presents to the ED with generalized weakness and feeling unwell in the setting of COVID-19, with evidence of associated dehydration and mild COPD exacerbation, ultimately amenable to outpatient management.  Normal vitals on room air.  Exam with stigmata of COPD exacerbation with expiratory wheezes and decreased air movement throughout.  CXR without infiltration or PTX.  No evidence of ACS.  Blood work with slight metabolic and electrolyte derangements, likely due to poor p.o. intake and dehydration in the setting of her current acute illness.  She is provided 1 L of LR and p.o. food.  She does not have an inhaler at home, so was provided albuterol MDI and started on a steroid burst to treat her COPD exacerbation.  After these interventions, she perks up and looks much better.  She is requesting discharge back to home with her sister.   We discussed return precautions prior to discharge.  Clinical Course as of 04/10/21 1939  Wed Apr 10, 2021  1936 Reassessed.  Patient reports feeling better.  She has tolerated p.o. intake without complication.  Nearly finished a bag of fluids.  We discussed stigmata of COP exacerbation and need for steroid burst.  We discussed medications at home to help with her symptoms.  We discussed return precautions for the ED. [DS]    Clinical Course User Index [DS] Vladimir Crofts, MD    ____________________________________________   FINAL CLINICAL IMPRESSION(S) / ED DIAGNOSES  Final diagnoses:  Generalized weakness  COVID-19  Dehydration     ED Discharge Orders     None        Luciana Cammarata Tamala Julian  Note:  This document was prepared using Dragon voice recognition software and may include unintentional dictation errors.    Vladimir Crofts, MD 04/10/21 [phone]

## 2021-04-10 NOTE — ED Triage Notes (Signed)
First nurse note: ems from home with weakness/covid. Stroke screen negative. Several falls since covid dx last wed. Unable to walk by herself. VSS. AOx3

## 2021-04-10 NOTE — ED Triage Notes (Signed)
Patient to ED via [facility] for generalized weakness/ covid. Pt was diagnosis with COVID at [place] last Wednesday. PT states she is now having difficulty ambulating and increase problems with her vision. Aox4.

## 2021-04-10 NOTE — Discharge Instructions (Addendum)
Use Tylenol for pain and fevers.  Up to 1000 mg per dose, up to 4 times per day.  Do not take more than 4000 mg of Tylenol/acetaminophen within 24 hours..  

## 2021-04-10 NOTE — ED Notes (Signed)
Offered patient [place] sandwich, crackers, peanut butter, and juice.  Patient aware of need for urine specimen collection.

## 2021-04-10 NOTE — ED Notes (Signed)
X-ray at bedside

## 2021-04-16 ENCOUNTER — Institutional Professional Consult (permissible substitution): Admit: 2021-04-16 | Discharge: 2021-04-17 | Payer: MEDICARE

## 2021-04-30 ENCOUNTER — Ambulatory Visit: Admit: 2021-04-30 | Discharge: 2021-05-01 | Payer: MEDICARE | Attending: Family | Primary: Family

## 2021-04-30 ENCOUNTER — Ambulatory Visit: Admit: 2021-04-30 | Discharge: 2021-05-01 | Payer: MEDICARE

## 2021-05-08 MED FILL — CLONAZEPAM 1 MG TABLET: ORAL | 30 days supply | Qty: 60 | Fill #3

## 2021-06-05 DIAGNOSIS — G40219 Localization-related (focal) (partial) symptomatic epilepsy and epileptic syndromes with complex partial seizures, intractable, without status epilepticus: Principal | ICD-10-CM

## 2021-06-05 MED ORDER — CLONAZEPAM 1 MG TABLET
ORAL_TABLET | Freq: Two times a day (BID) | ORAL | 5 refills | 30 days | Status: CP
Start: 2021-06-05 — End: ?

## 2021-06-11 ENCOUNTER — Ambulatory Visit
Admit: 2021-06-11 | Discharge: 2021-06-12 | Payer: MEDICARE | Attending: Student in an Organized Health Care Education/Training Program | Primary: Student in an Organized Health Care Education/Training Program

## 2021-06-11 ENCOUNTER — Ambulatory Visit: Admit: 2021-06-11 | Discharge: 2021-06-12 | Payer: MEDICARE

## 2021-06-11 DIAGNOSIS — G40219 Localization-related (focal) (partial) symptomatic epilepsy and epileptic syndromes with complex partial seizures, intractable, without status epilepticus: Principal | ICD-10-CM

## 2021-06-11 MED ORDER — LACOSAMIDE 150 MG TABLET
ORAL_TABLET | Freq: Two times a day (BID) | ORAL | 1 refills | 90.00000 days | Status: CP
Start: 2021-06-11 — End: ?

## 2021-06-11 MED ORDER — LEVETIRACETAM 500 MG TABLET
ORAL_TABLET | 4 refills | 0 days | Status: CP
Start: 2021-06-11 — End: ?

## 2021-06-11 MED ORDER — GABAPENTIN 300 MG CAPSULE
ORAL_CAPSULE | Freq: Two times a day (BID) | ORAL | 4 refills | 90 days | Status: CP
Start: 2021-06-11 — End: 2022-06-11

## 2021-06-11 MED ORDER — CLONAZEPAM 1 MG TABLET
ORAL_TABLET | Freq: Two times a day (BID) | ORAL | 1 refills | 90.00000 days | Status: CP
Start: 2021-06-11 — End: ?

## 2021-06-12 DIAGNOSIS — G40219 Localization-related (focal) (partial) symptomatic epilepsy and epileptic syndromes with complex partial seizures, intractable, without status epilepticus: Principal | ICD-10-CM

## 2021-06-12 DIAGNOSIS — I5030 Unspecified diastolic (congestive) heart failure: Principal | ICD-10-CM

## 2021-06-12 MED ORDER — CLONAZEPAM 1 MG TABLET
ORAL_TABLET | Freq: Two times a day (BID) | ORAL | 1 refills | 90 days | Status: CP
Start: 2021-06-12 — End: ?
  Filled 2021-06-13: qty 180, 90d supply, fill #0

## 2021-06-12 MED ORDER — JARDIANCE 10 MG TABLET
ORAL_TABLET | 0 refills | 0 days
Start: 2021-06-12 — End: ?

## 2021-06-12 MED ORDER — CARVEDILOL 12.5 MG TABLET
ORAL_TABLET | Freq: Two times a day (BID) | ORAL | 0 refills | 0.00000 days | Status: CP
Start: 2021-06-12 — End: ?

## 2021-06-12 MED ORDER — EMPAGLIFLOZIN 10 MG TABLET
ORAL_TABLET | Freq: Every day | ORAL | 1 refills | 90 days | Status: CP
Start: 2021-06-12 — End: ?

## 2021-07-08 ENCOUNTER — Ambulatory Visit
Admit: 2021-07-08 | Discharge: 2021-07-09 | Payer: MEDICARE | Attending: Student in an Organized Health Care Education/Training Program | Primary: Student in an Organized Health Care Education/Training Program

## 2021-07-08 DIAGNOSIS — E7800 Pure hypercholesterolemia, unspecified: Principal | ICD-10-CM

## 2021-07-08 DIAGNOSIS — I5030 Unspecified diastolic (congestive) heart failure: Principal | ICD-10-CM

## 2021-07-08 DIAGNOSIS — E876 Hypokalemia: Principal | ICD-10-CM

## 2021-07-08 DIAGNOSIS — M791 Myalgia, unspecified site: Principal | ICD-10-CM

## 2021-07-08 DIAGNOSIS — Z720 Tobacco use: Principal | ICD-10-CM

## 2021-07-08 DIAGNOSIS — I2510 Atherosclerotic heart disease of native coronary artery without angina pectoris: Principal | ICD-10-CM

## 2021-07-08 DIAGNOSIS — N1832 Chronic kidney disease, stage 3b: Secondary | ICD-10-CM | POA: Diagnosis present

## 2021-07-08 MED ORDER — SPIRONOLACTONE 25 MG TABLET
ORAL_TABLET | Freq: Every day | ORAL | 1 refills | 90 days | Status: CP
Start: 2021-07-08 — End: 2021-10-06

## 2021-07-08 MED ORDER — ATORVASTATIN 40 MG TABLET
ORAL_TABLET | Freq: Every day | ORAL | 3 refills | 90 days | Status: CP
Start: 2021-07-08 — End: 2022-07-08

## 2021-08-02 ENCOUNTER — Other Ambulatory Visit: Payer: Self-pay

## 2021-08-02 ENCOUNTER — Inpatient Hospital Stay
Admission: EM | Admit: 2021-08-02 | Discharge: 2021-08-06 | DRG: 089 | Disposition: A | Discharge status: Skilled Nursing Facility | Payer: Medicare HMO | Attending: Internal Medicine | Admitting: Internal Medicine

## 2021-08-02 ENCOUNTER — Emergency Department: Payer: Medicare HMO

## 2021-08-02 DIAGNOSIS — R296 Repeated falls: Secondary | ICD-10-CM | POA: Diagnosis present

## 2021-08-02 DIAGNOSIS — I5032 Chronic diastolic (congestive) heart failure: Secondary | ICD-10-CM | POA: Diagnosis present

## 2021-08-02 DIAGNOSIS — S060XAA Concussion with loss of consciousness status unknown, initial encounter: Secondary | ICD-10-CM | POA: Diagnosis present

## 2021-08-02 DIAGNOSIS — I10 Essential (primary) hypertension: Secondary | ICD-10-CM | POA: Diagnosis not present

## 2021-08-02 DIAGNOSIS — E042 Nontoxic multinodular goiter: Secondary | ICD-10-CM | POA: Diagnosis present

## 2021-08-02 DIAGNOSIS — B961 Klebsiella pneumoniae [K. pneumoniae] as the cause of diseases classified elsewhere: Secondary | ICD-10-CM | POA: Diagnosis present

## 2021-08-02 DIAGNOSIS — N390 Urinary tract infection, site not specified: Secondary | ICD-10-CM | POA: Diagnosis present

## 2021-08-02 DIAGNOSIS — E1140 Type 2 diabetes mellitus with diabetic neuropathy, unspecified: Secondary | ICD-10-CM | POA: Diagnosis present

## 2021-08-02 DIAGNOSIS — R402362 Coma scale, best motor response, obeys commands, at arrival to emergency department: Secondary | ICD-10-CM | POA: Diagnosis present

## 2021-08-02 DIAGNOSIS — E871 Hypo-osmolality and hyponatremia: Secondary | ICD-10-CM | POA: Diagnosis present

## 2021-08-02 DIAGNOSIS — S1980XA Other specified injuries of unspecified part of neck, initial encounter: Secondary | ICD-10-CM

## 2021-08-02 DIAGNOSIS — N1832 Chronic kidney disease, stage 3b: Secondary | ICD-10-CM | POA: Diagnosis present

## 2021-08-02 DIAGNOSIS — R569 Unspecified convulsions: Secondary | ICD-10-CM

## 2021-08-02 DIAGNOSIS — F32A Depression, unspecified: Secondary | ICD-10-CM | POA: Diagnosis present

## 2021-08-02 DIAGNOSIS — I959 Hypotension, unspecified: Secondary | ICD-10-CM | POA: Diagnosis present

## 2021-08-02 DIAGNOSIS — R4182 Altered mental status, unspecified: Secondary | ICD-10-CM | POA: Diagnosis present

## 2021-08-02 DIAGNOSIS — Z20822 Contact with and (suspected) exposure to covid-19: Secondary | ICD-10-CM | POA: Diagnosis present

## 2021-08-02 DIAGNOSIS — C50912 Malignant neoplasm of unspecified site of left female breast: Secondary | ICD-10-CM | POA: Diagnosis present

## 2021-08-02 DIAGNOSIS — S0083XA Contusion of other part of head, initial encounter: Secondary | ICD-10-CM

## 2021-08-02 DIAGNOSIS — F419 Anxiety disorder, unspecified: Secondary | ICD-10-CM | POA: Diagnosis present

## 2021-08-02 DIAGNOSIS — E1122 Type 2 diabetes mellitus with diabetic chronic kidney disease: Secondary | ICD-10-CM | POA: Diagnosis present

## 2021-08-02 DIAGNOSIS — R531 Weakness: Secondary | ICD-10-CM | POA: Diagnosis not present

## 2021-08-02 DIAGNOSIS — I252 Old myocardial infarction: Secondary | ICD-10-CM | POA: Diagnosis not present

## 2021-08-02 DIAGNOSIS — I2510 Atherosclerotic heart disease of native coronary artery without angina pectoris: Secondary | ICD-10-CM | POA: Diagnosis present

## 2021-08-02 DIAGNOSIS — R402242 Coma scale, best verbal response, confused conversation, at arrival to emergency department: Secondary | ICD-10-CM | POA: Diagnosis present

## 2021-08-02 DIAGNOSIS — Z79899 Other long term (current) drug therapy: Secondary | ICD-10-CM

## 2021-08-02 DIAGNOSIS — R410 Disorientation, unspecified: Secondary | ICD-10-CM | POA: Diagnosis not present

## 2021-08-02 DIAGNOSIS — S12501A Unspecified nondisplaced fracture of sixth cervical vertebra, initial encounter for closed fracture: Secondary | ICD-10-CM | POA: Diagnosis present

## 2021-08-02 DIAGNOSIS — S1093XA Contusion of unspecified part of neck, initial encounter: Secondary | ICD-10-CM | POA: Diagnosis present

## 2021-08-02 DIAGNOSIS — R778 Other specified abnormalities of plasma proteins: Secondary | ICD-10-CM | POA: Diagnosis not present

## 2021-08-02 DIAGNOSIS — M6282 Rhabdomyolysis: Secondary | ICD-10-CM | POA: Diagnosis present

## 2021-08-02 DIAGNOSIS — R402142 Coma scale, eyes open, spontaneous, at arrival to emergency department: Secondary | ICD-10-CM | POA: Diagnosis present

## 2021-08-02 DIAGNOSIS — S12591A Other nondisplaced fracture of sixth cervical vertebra, initial encounter for closed fracture: Secondary | ICD-10-CM

## 2021-08-02 DIAGNOSIS — D519 Vitamin B12 deficiency anemia, unspecified: Secondary | ICD-10-CM | POA: Diagnosis present

## 2021-08-02 DIAGNOSIS — S134XXA Sprain of ligaments of cervical spine, initial encounter: Secondary | ICD-10-CM

## 2021-08-02 DIAGNOSIS — Z91041 Radiographic dye allergy status: Secondary | ICD-10-CM

## 2021-08-02 DIAGNOSIS — Z1330 Encounter for screening examination for mental health and behavioral disorders, unspecified: Secondary | ICD-10-CM | POA: Diagnosis not present

## 2021-08-02 DIAGNOSIS — G40909 Epilepsy, unspecified, not intractable, without status epilepticus: Secondary | ICD-10-CM | POA: Diagnosis present

## 2021-08-02 DIAGNOSIS — W19XXXA Unspecified fall, initial encounter: Secondary | ICD-10-CM

## 2021-08-02 DIAGNOSIS — E860 Dehydration: Secondary | ICD-10-CM | POA: Diagnosis present

## 2021-08-02 DIAGNOSIS — F17210 Nicotine dependence, cigarettes, uncomplicated: Secondary | ICD-10-CM | POA: Diagnosis present

## 2021-08-02 DIAGNOSIS — W1830XA Fall on same level, unspecified, initial encounter: Secondary | ICD-10-CM | POA: Diagnosis present

## 2021-08-02 DIAGNOSIS — Z8249 Family history of ischemic heart disease and other diseases of the circulatory system: Secondary | ICD-10-CM

## 2021-08-02 DIAGNOSIS — I5030 Unspecified diastolic (congestive) heart failure: Secondary | ICD-10-CM | POA: Diagnosis present

## 2021-08-02 DIAGNOSIS — E785 Hyperlipidemia, unspecified: Secondary | ICD-10-CM | POA: Diagnosis present

## 2021-08-02 DIAGNOSIS — I214 Non-ST elevation (NSTEMI) myocardial infarction: Secondary | ICD-10-CM | POA: Diagnosis present

## 2021-08-02 DIAGNOSIS — J45909 Unspecified asthma, uncomplicated: Secondary | ICD-10-CM | POA: Diagnosis present

## 2021-08-02 DIAGNOSIS — Z9861 Coronary angioplasty status: Secondary | ICD-10-CM

## 2021-08-02 DIAGNOSIS — E041 Nontoxic single thyroid nodule: Secondary | ICD-10-CM | POA: Diagnosis present

## 2021-08-02 DIAGNOSIS — R270 Ataxia, unspecified: Secondary | ICD-10-CM | POA: Diagnosis present

## 2021-08-02 DIAGNOSIS — I130 Hypertensive heart and chronic kidney disease with heart failure and stage 1 through stage 4 chronic kidney disease, or unspecified chronic kidney disease: Secondary | ICD-10-CM | POA: Diagnosis present

## 2021-08-02 DIAGNOSIS — D649 Anemia, unspecified: Secondary | ICD-10-CM | POA: Diagnosis present

## 2021-08-02 DIAGNOSIS — D513 Other dietary vitamin B12 deficiency anemia: Secondary | ICD-10-CM | POA: Diagnosis not present

## 2021-08-02 LAB — DIFFERENTIAL
Abs Immature Granulocytes: 0.04 10*3/uL (ref 0.00–0.07)
Basophils Absolute: 0 10*3/uL (ref 0.0–0.1)
Basophils Relative: 1 %
Eosinophils Absolute: 0 10*3/uL (ref 0.0–0.5)
Eosinophils Relative: 0 %
Immature Granulocytes: 1 %
Lymphocytes Relative: 5 %
Lymphs Abs: 0.4 10*3/uL — ABNORMAL LOW (ref 0.7–4.0)
Monocytes Absolute: 0.5 10*3/uL (ref 0.1–1.0)
Monocytes Relative: 7 %
Neutro Abs: 6.4 10*3/uL (ref 1.7–7.7)
Neutrophils Relative %: 86 %

## 2021-08-02 LAB — URINE DRUG SCREEN, QUALITATIVE (ARMC ONLY)
Amphetamines, Ur Screen: NOT DETECTED
Barbiturates, Ur Screen: NOT DETECTED
Benzodiazepine, Ur Scrn: NOT DETECTED
Cannabinoid 50 Ng, Ur ~~LOC~~: NOT DETECTED
Cocaine Metabolite,Ur ~~LOC~~: NOT DETECTED
MDMA (Ecstasy)Ur Screen: NOT DETECTED
Methadone Scn, Ur: NOT DETECTED
Opiate, Ur Screen: NOT DETECTED
Phencyclidine (PCP) Ur S: NOT DETECTED
Tricyclic, Ur Screen: NOT DETECTED

## 2021-08-02 LAB — COMPREHENSIVE METABOLIC PANEL
ALT: 9 U/L (ref 0–44)
AST: 19 U/L (ref 15–41)
Albumin: 4.4 g/dL (ref 3.5–5.0)
Alkaline Phosphatase: 130 U/L — ABNORMAL HIGH (ref 38–126)
Anion gap: 11 (ref 5–15)
BUN: 29 mg/dL — ABNORMAL HIGH (ref 8–23)
CO2: 23 mmol/L (ref 22–32)
Calcium: 9.1 mg/dL (ref 8.9–10.3)
Chloride: 97 mmol/L — ABNORMAL LOW (ref 98–111)
Creatinine, Ser: 1.52 mg/dL — ABNORMAL HIGH (ref 0.44–1.00)
GFR, Estimated: 37 mL/min — ABNORMAL LOW (ref >60)
Glucose, Bld: 98 mg/dL (ref 70–99)
Potassium: 3.5 mmol/L (ref 3.5–5.1)
Sodium: 131 mmol/L — ABNORMAL LOW (ref 135–145)
Total Bilirubin: 0.7 mg/dL (ref 0.3–1.2)
Total Protein: 7.9 g/dL (ref 6.5–8.1)

## 2021-08-02 LAB — URINALYSIS, COMPLETE (UACMP) WITH MICROSCOPIC
Bilirubin Urine: NEGATIVE
Glucose, UA: NEGATIVE mg/dL
Ketones, ur: NEGATIVE mg/dL
Nitrite: POSITIVE — AB
Protein, ur: NEGATIVE mg/dL
Specific Gravity, Urine: 1.006 (ref 1.005–1.030)
pH: 5 (ref 5.0–8.0)

## 2021-08-02 LAB — CBC
HCT: 34.7 % — ABNORMAL LOW (ref 36.0–46.0)
Hemoglobin: 11.3 g/dL — ABNORMAL LOW (ref 12.0–15.0)
MCH: 29.3 pg (ref 26.0–34.0)
MCHC: 32.6 g/dL (ref 30.0–36.0)
MCV: 89.9 fL (ref 80.0–100.0)
Platelets: 174 10*3/uL (ref 150–400)
RBC: 3.86 MIL/uL — ABNORMAL LOW (ref 3.87–5.11)
RDW: 14.4 % (ref 11.5–15.5)
WBC: 7.4 10*3/uL (ref 4.0–10.5)
nRBC: 0 % (ref 0.0–0.2)

## 2021-08-02 LAB — GLUCOSE, CAPILLARY: Glucose-Capillary: 116 mg/dL — ABNORMAL HIGH (ref 70–99)

## 2021-08-02 LAB — RESP PANEL BY RT-PCR (FLU A&B, COVID) ARPGX2
Influenza A by PCR: NEGATIVE
Influenza B by PCR: NEGATIVE
SARS Coronavirus 2 by RT PCR: NEGATIVE

## 2021-08-02 LAB — CBG MONITORING, ED: Glucose-Capillary: 104 mg/dL — ABNORMAL HIGH (ref 70–99)

## 2021-08-02 LAB — PROTIME-INR
INR: 1.2 (ref 0.8–1.2)
Prothrombin Time: 15.3 seconds — ABNORMAL HIGH (ref 11.4–15.2)

## 2021-08-02 LAB — APTT: aPTT: 45 seconds — ABNORMAL HIGH (ref 24–36)

## 2021-08-02 LAB — TROPONIN I (HIGH SENSITIVITY)
Troponin I (High Sensitivity): 27 ng/L — ABNORMAL HIGH (ref <18)
Troponin I (High Sensitivity): 31 ng/L — ABNORMAL HIGH (ref <18)

## 2021-08-02 LAB — ETHANOL: Alcohol, Ethyl (B): <10 mg/dL (ref <10)

## 2021-08-02 IMAGING — CT CT CERVICAL SPINE W/O CM
3 series · 10 of 33 positions shown, 12 images · non-contrast
Comparison: None.

CLINICAL DATA: Neck trauma (Age >= 65y)

EXAM:
CT CERVICAL SPINE WITHOUT CONTRAST
TECHNIQUE: Multidetector CT imaging of the cervical spine was performed without
intravenous contrast. Multiplanar CT image reconstructions were also
generated.

[Series 5: c spine soft · axial · 0.39mm/px · z∈[+5,+71]mm · 2 of 72 slices shown, 3 images]
[im 22/72  soft-tissue]
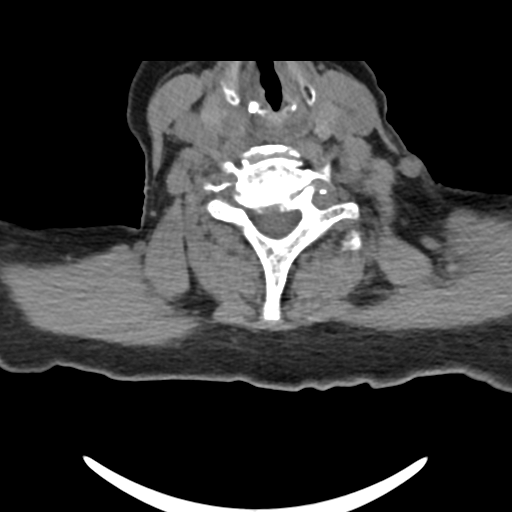
[im 22/72  bone]
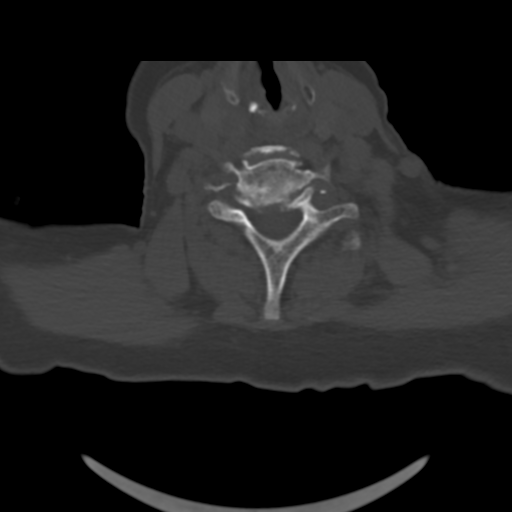
[im 55/72  bone]
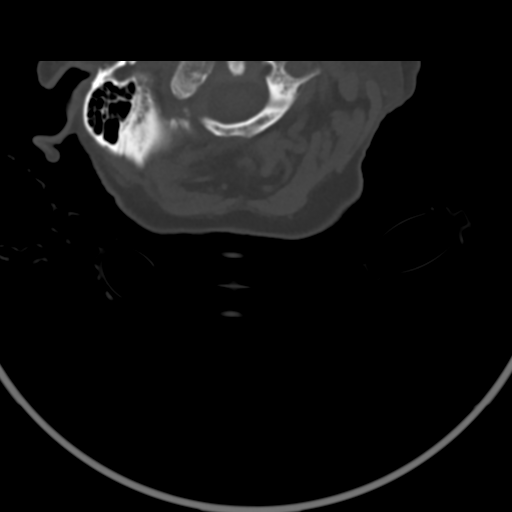

[Series 8: sagittal bone · sagittal · 0.18mm/px · 5 of 63 slices shown, 6 images]
[im 21/63  bone]
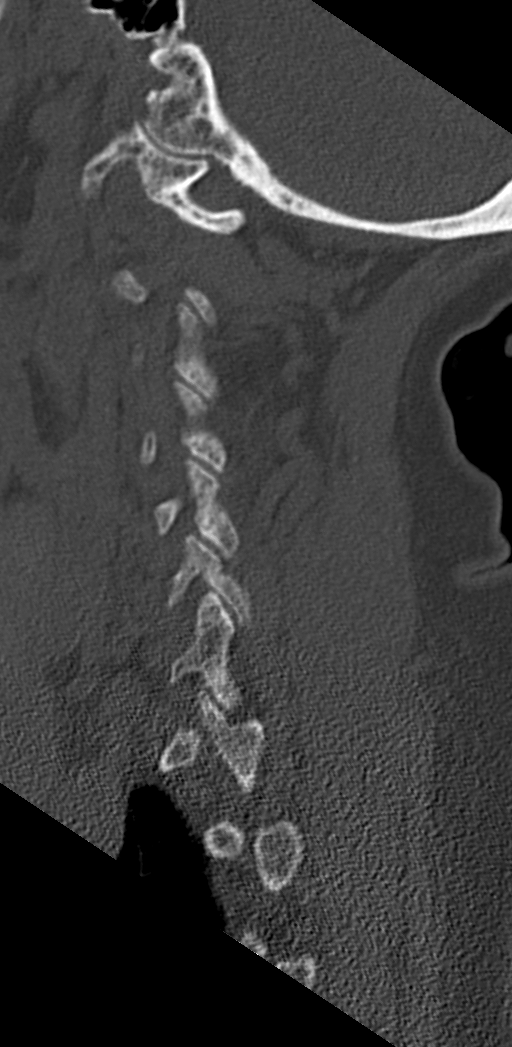
[im 26/63  bone]
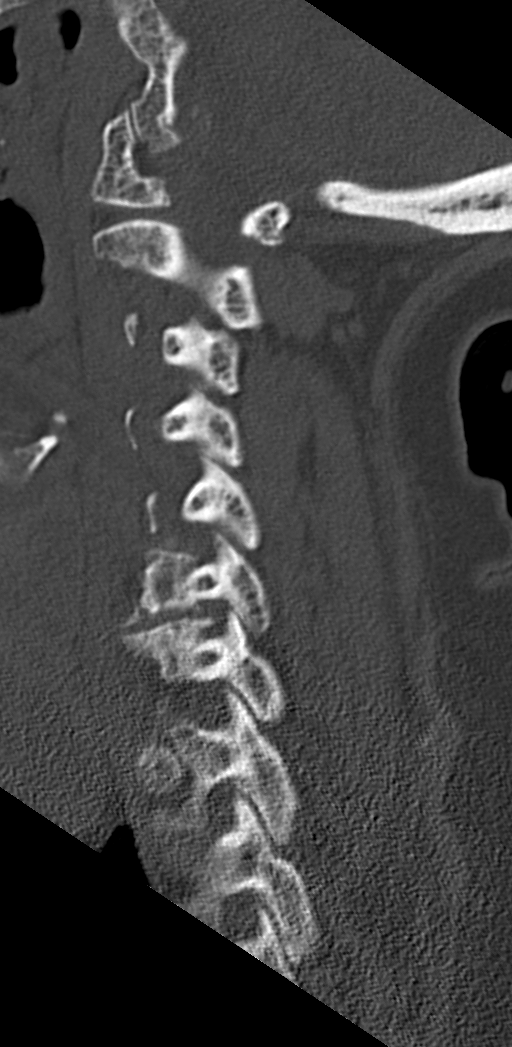
[im 32/63  soft-tissue]
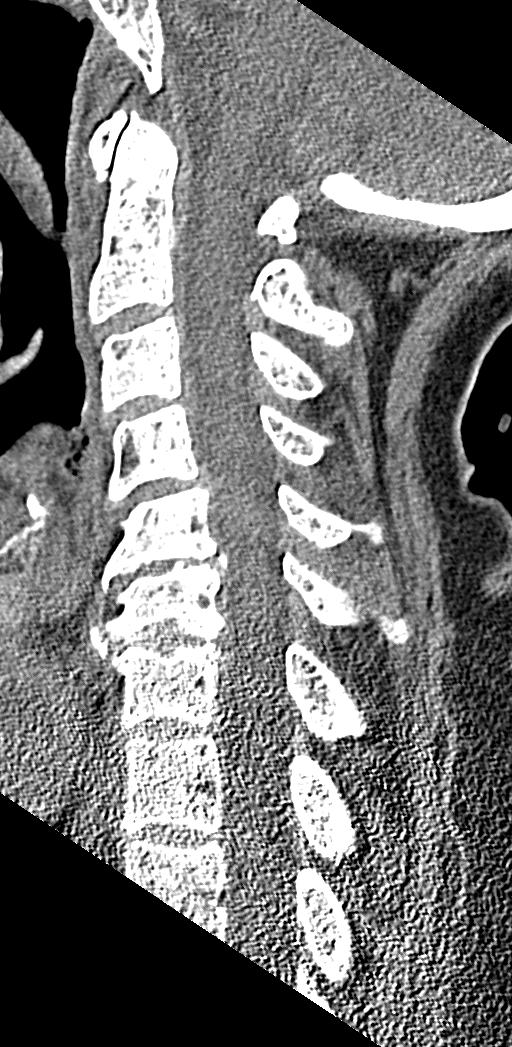
[im 32/63  bone]
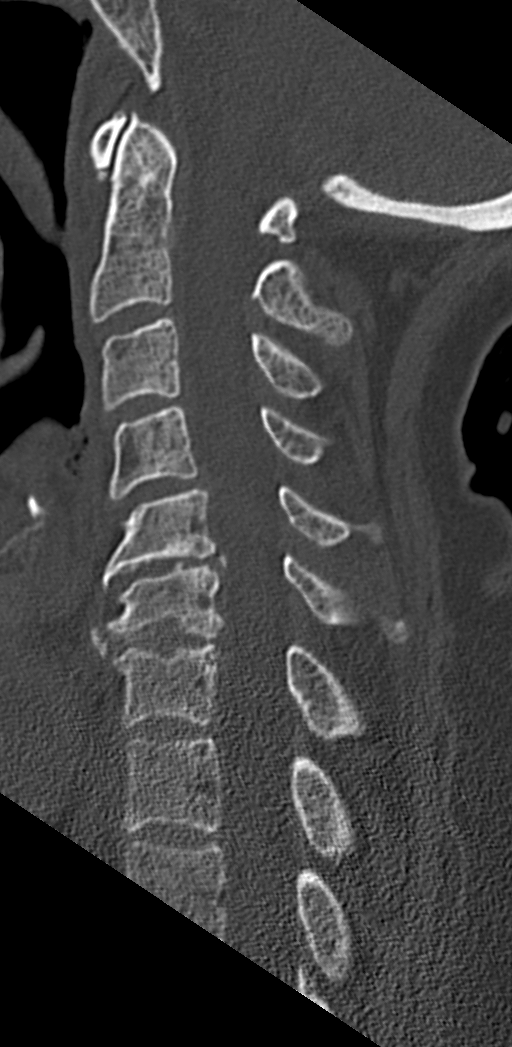
[im 37/63  bone]
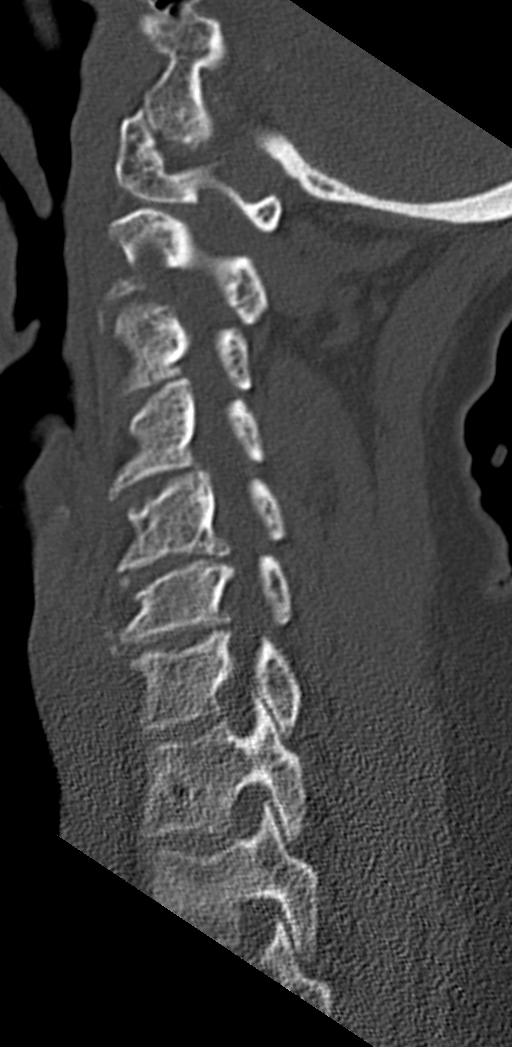
[im 42/63  bone]
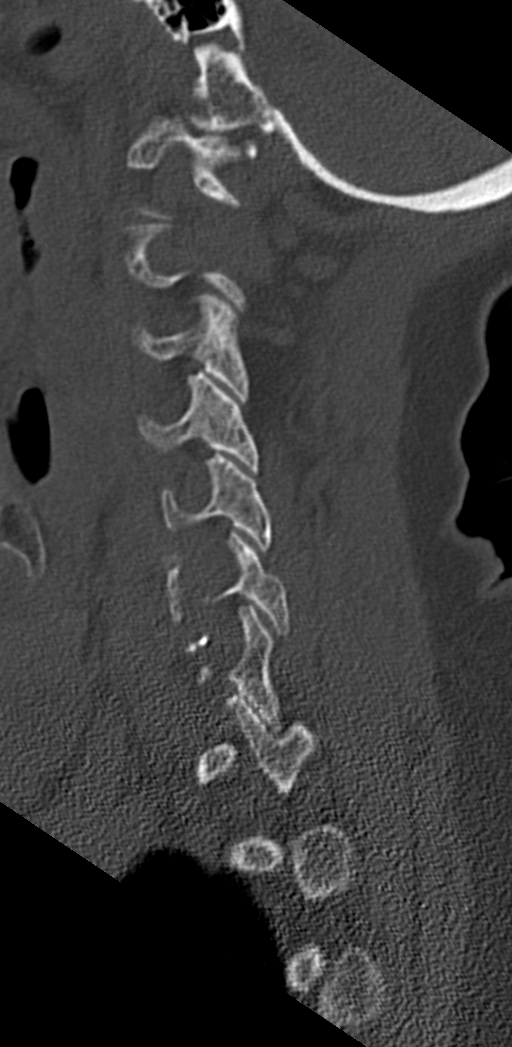

[Series 9: coronal bone · coronal · 0.24mm/px · 3 of 46 slices shown]
[im 10/46  bone]
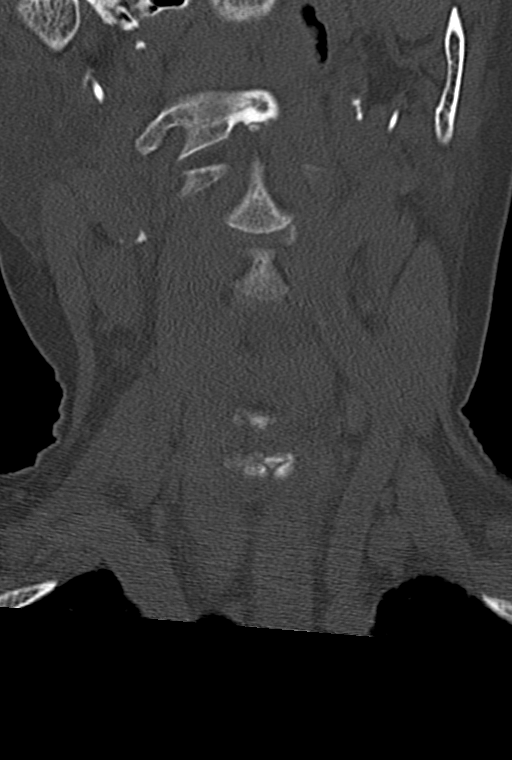
[im 19/46  bone]
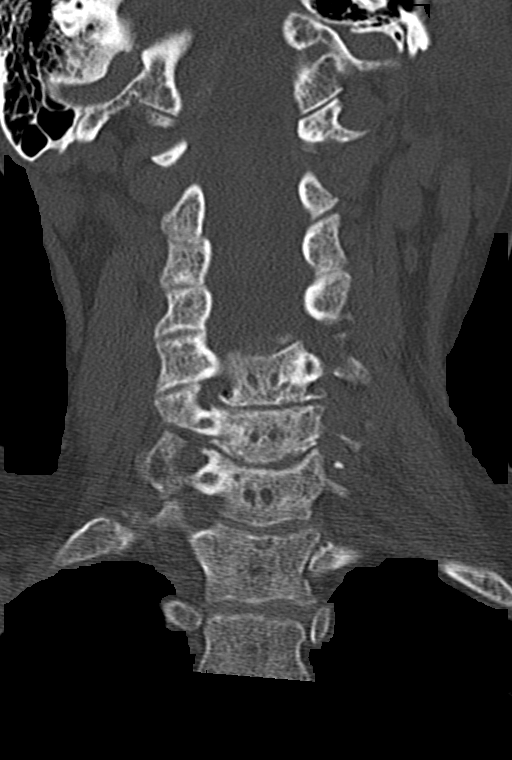
[im 28/46  bone]
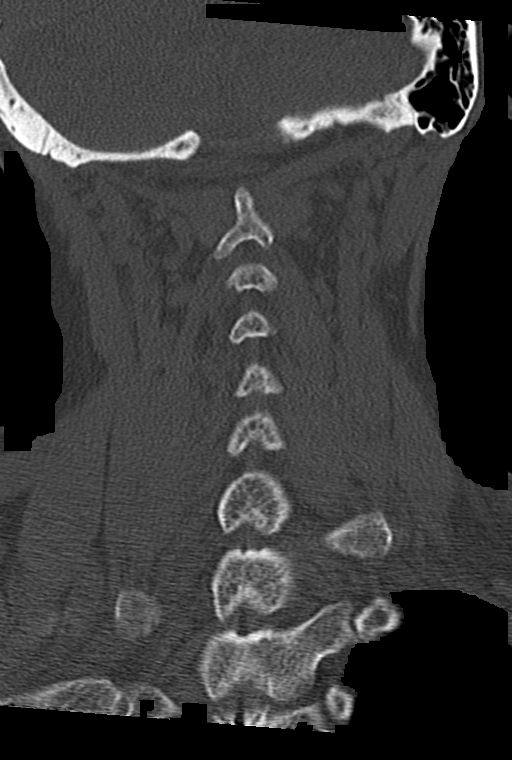

[10 of 33 positions shown; findings below may reference images not displayed]

FINDINGS: Alignment: Straightening of the normal cervical lordosis likely due
to patient positioning.

Skull base and vertebrae: There is a possible small anterior
inferior endplate fracture of C6, with a non corticated margin. This
was identified as well on [HOSPITAL] software.

Soft tissues and spinal canal: No prevertebral fluid or swelling. No
visible canal hematoma.

Disc levels: There is multilevel degenerative disc disease, moderate
at C5-C6 and C6-C7. Posterior disc osteophyte complexes at these
levels.

Upper chest: Negative

Other: There are multiple hypodense thyroid nodules bilaterally,
largest measuring up to 3.7 cm in the right thyroid lobe.
IMPRESSION: Possible small anterior inferior endplate fracture of C6. This could
represent sequela of degenerative osteophyte formation but there
appears to be a non-corticated margin. No priors for comparison.
Consider MRI for further evaluation.

## 2021-08-02 IMAGING — CT CT HEAD CODE STROKE
4 of 5 series · 16 of 47 positions shown, 18 images · non-contrast
Comparison: [DATE]

CLINICAL DATA: Code stroke. Neuro deficit, acute, stroke suspected
left sided weakness, slurred speech

EXAM:
CT HEAD WITHOUT CONTRAST
TECHNIQUE: Contiguous axial images were obtained from the base of the skull
through the vertex without intravenous contrast.

[Series 3: head wo · axial · 0.44mm/px · z∈[+103,+213]mm · 6 of 32 slices shown, 8 images]
[im 5/32  brain]
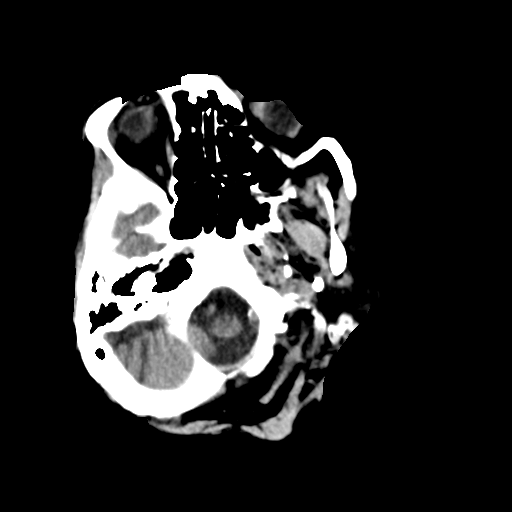
[im 5/32  bone]
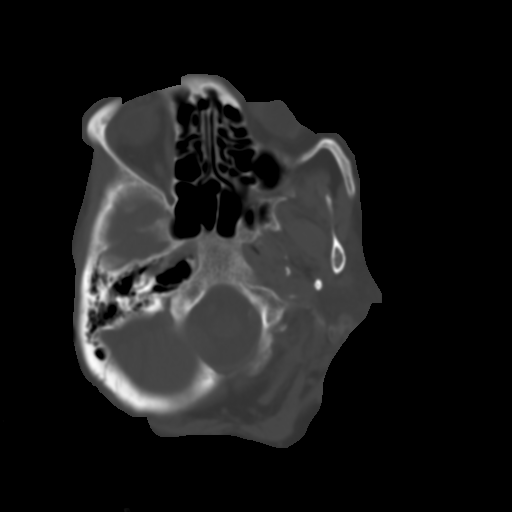
[im 9/32  brain]
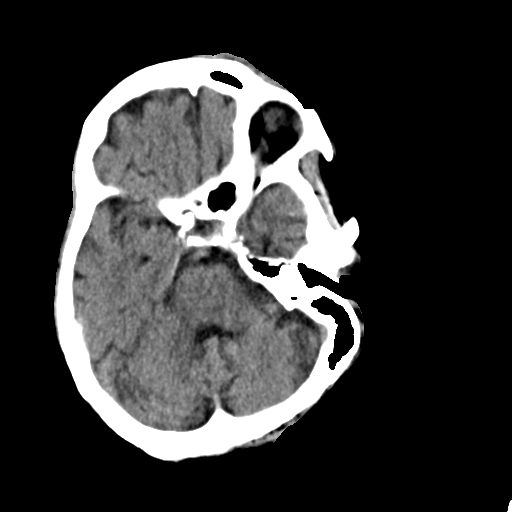
[im 14/32  brain]
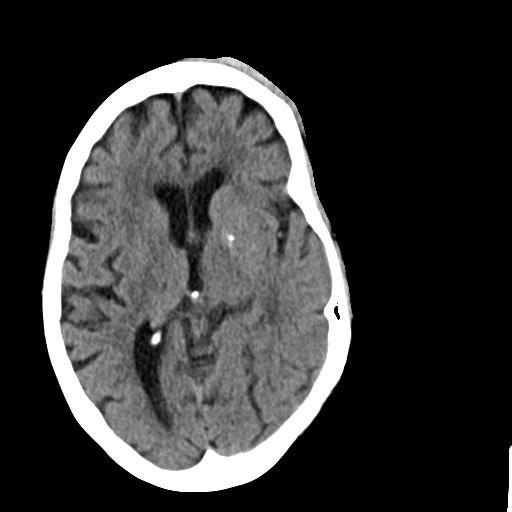
[im 18/32  brain]
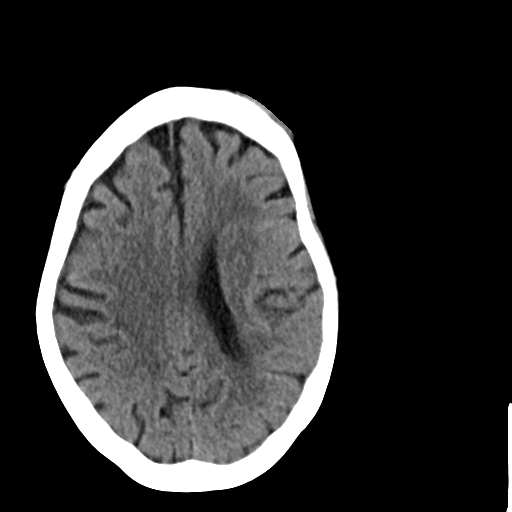
[im 23/32  brain]
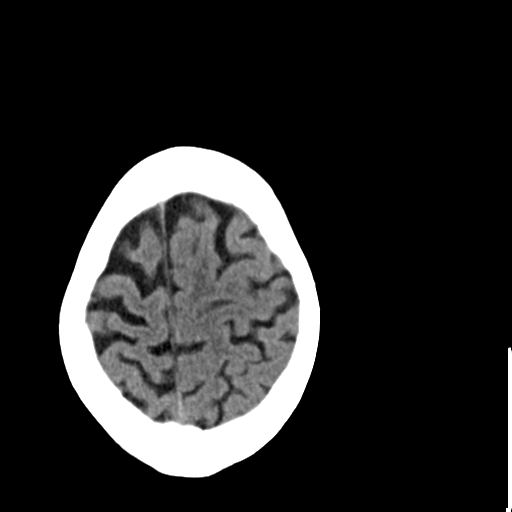
[im 23/32  bone]
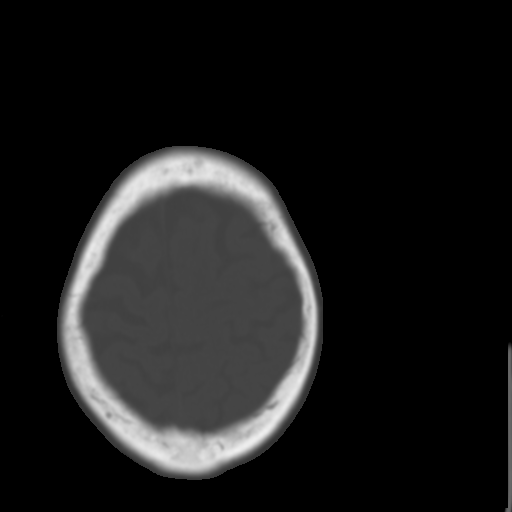
[im 27/32  brain]
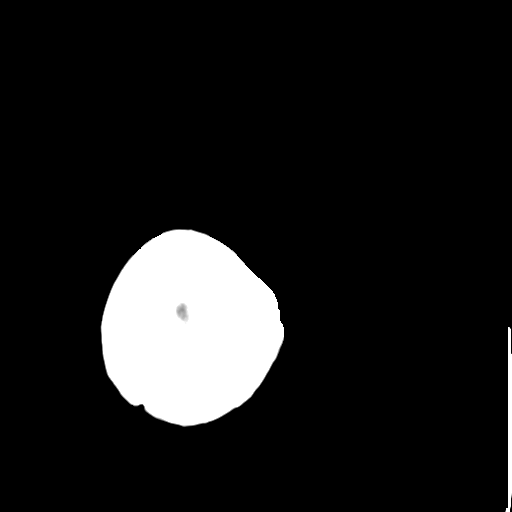

[Series 4: head bone · axial · 0.44mm/px · z∈[+97,+153]mm · 4 of 79 slices shown]
[im 8/79  bone]
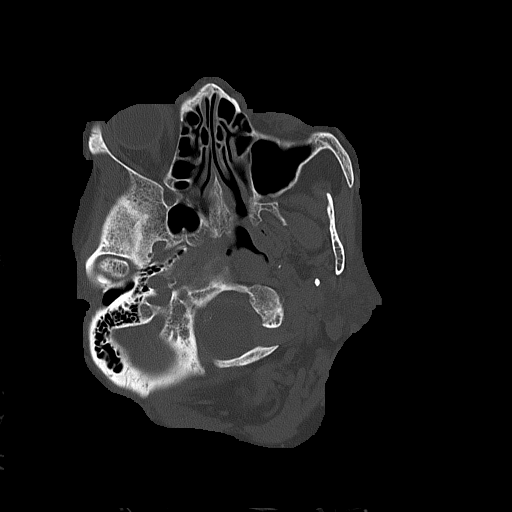
[im 16/79  bone]
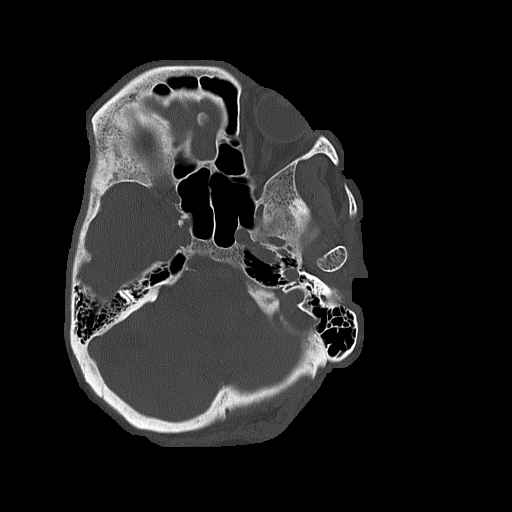
[im 24/79  bone]
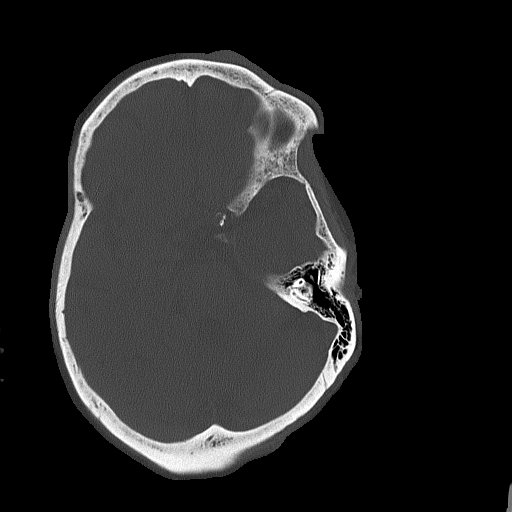
[im 36/79  bone]
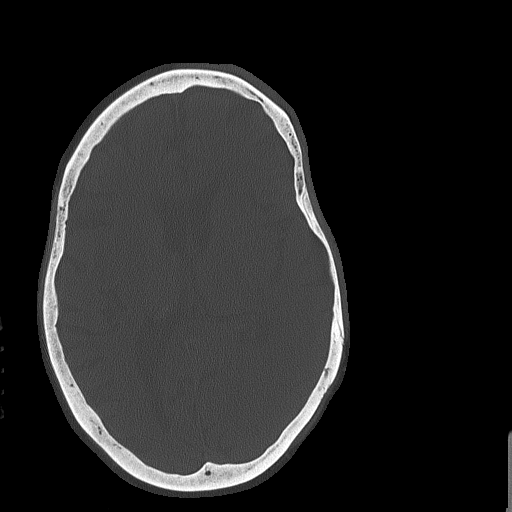

[Series 5: coronal soft tissue · coronal · 0.30mm/px · 3 of 68 slices shown]
[im 23/68  brain]
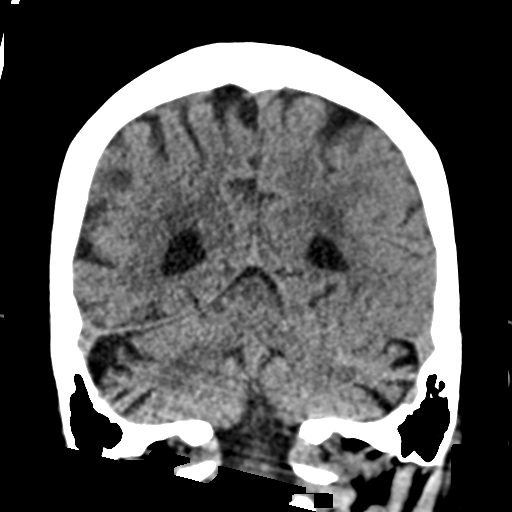
[im 30/68  brain]
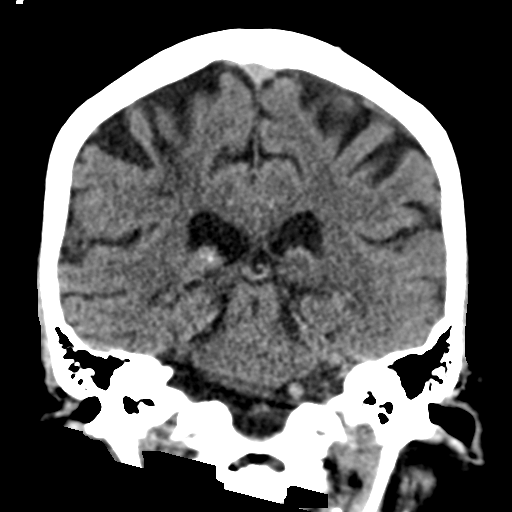
[im 38/68  brain]
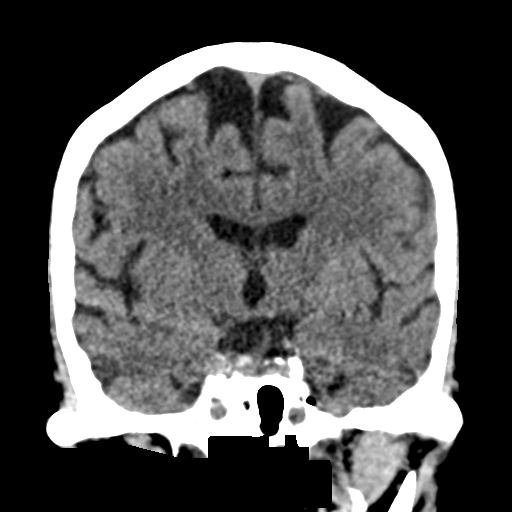

[Series 6: sagittal soft tissue · sagittal · 0.30mm/px · 3 of 53 slices shown]
[im 22/53  brain]
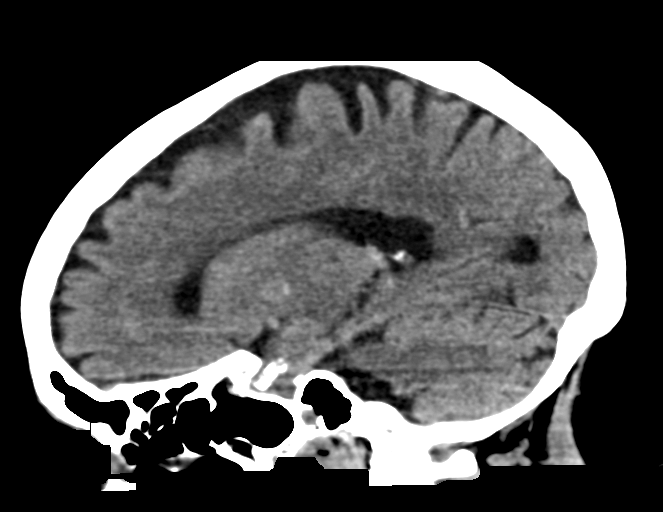
[im 27/53  brain]
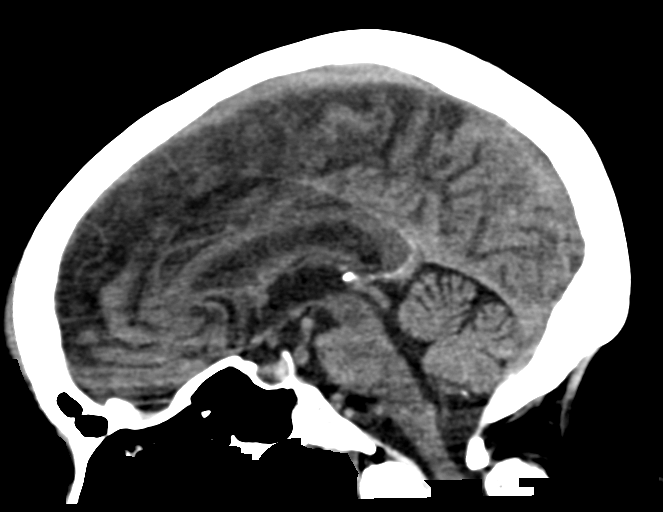
[im 31/53  brain]
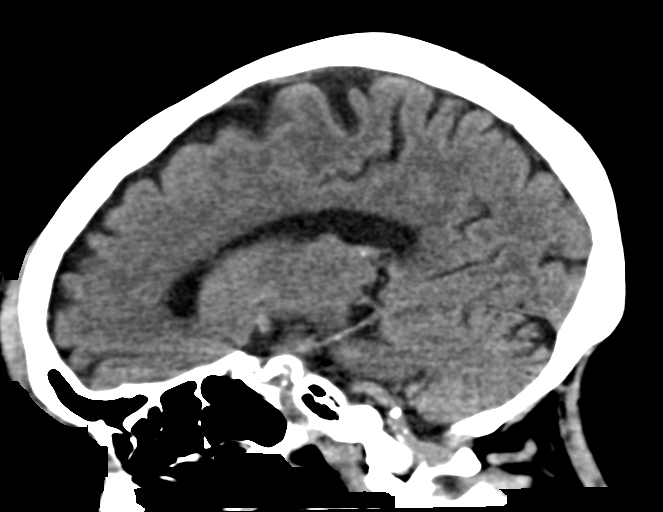

[16 of 47 positions shown; findings below may reference images not displayed]

FINDINGS: Brain: There is no acute intracranial hemorrhage, mass effect, or
edema. No new loss of gray-white differentiation. Ventricles and
sulci are stable in size and configuration. Patchy hypoattenuation
in the supratentorial white matter is nonspecific but probably
reflects stable chronic microvascular ischemic changes. No
extra-axial collection.

Vascular: No hyperdense vessel. There is intracranial
atherosclerotic calcification at the skull base.

Skull: Unremarkable.

Sinuses/Orbits: No acute abnormality.

Other: Left frontal scalp hematoma.

ASPECTS (Alberta Stroke Program Early CT Score)

- Ganglionic level infarction (caudate, lentiform nuclei, internal
capsule, insula, M1-M3 cortex): 7

- Supraganglionic infarction (M4-M6 cortex): 3

Total score (0-10 with 10 being normal): 10
IMPRESSION: There is no acute intracranial hemorrhage or evidence of acute
infarction. ASPECT score is 10.

These results were communicated to Dr. MERGHAD at [DATE] on
[DATE] by text page via the [employer] messaging system.

## 2021-08-02 IMAGING — MR MR HEAD W/O CM
13 series · 48 of 48 positions shown · non-contrast
Comparison: Same-day noncontrast head CT

CLINICAL DATA: Started feeling dizzy, weakness, fell and hit head



[Series 5: ax dwi_tracew · axial · 3.0mm · 1.80mm/px · z∈[-132,+21]mm · 5 of 48 slices shown]
[im 1/48]
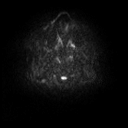
[im 12/48]
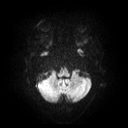
[im 24/48]
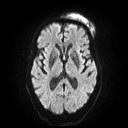
[im 36/48]
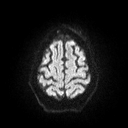
[im 48/48]
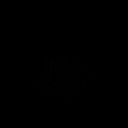

[Series 6: ax dwi_adc · axial · 3.0mm · 1.80mm/px · z∈[-132,+14]mm · 4 of 45 slices shown]
[im 1/45]
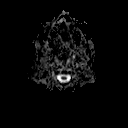
[im 15/45]
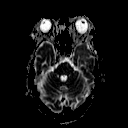
[im 30/45]
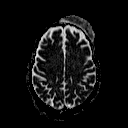
[im 45/45]
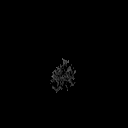

[Series 7: cor dwi_tracew · coronal · 5.0mm · 1.80mm/px · 3 of 38 slices shown]
[im 1/38]
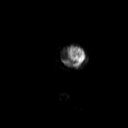
[im 19/38]
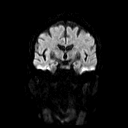
[im 38/38]
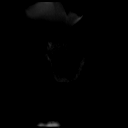

[Series 8: cor dwi_adc · coronal · 5.0mm · 1.80mm/px · 3 of 38 slices shown]
[im 1/38]
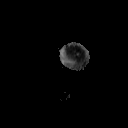
[im 19/38]
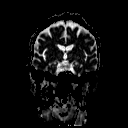
[im 38/38]
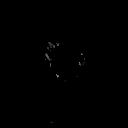

[Series 9: FLAIR · axial · 3.0mm · 0.69mm/px · z∈[-135,+25]mm · 5 of 54 slices shown]
[im 1/54]
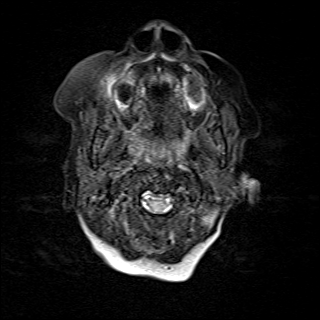
[im 14/54]
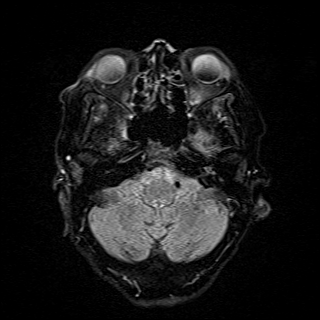
[im 27/54]
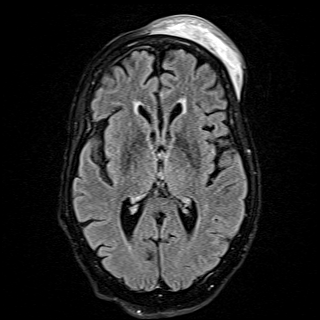
[im 40/54]
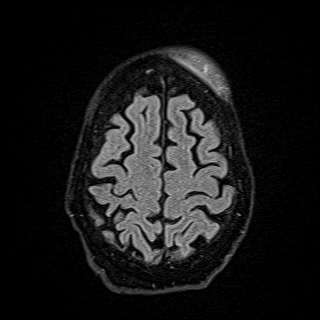
[im 54/54]
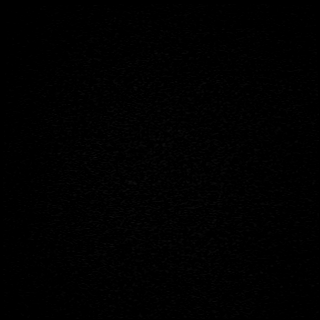

[Series 10: mag_images · axial · 3.0mm · 0.90mm/px · z∈[-129,+21]mm · 5 of 52 slices shown]
[im 1/52]
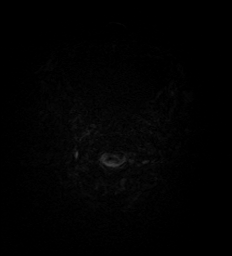
[im 13/52]
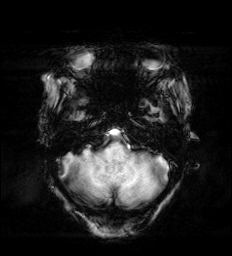
[im 26/52]
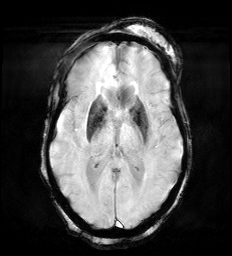
[im 39/52]
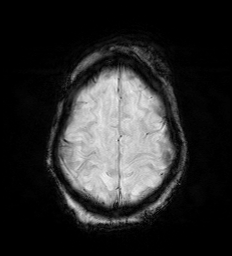
[im 52/52]
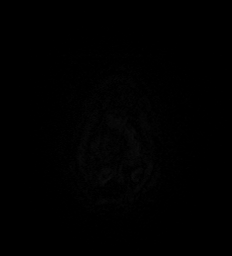

[Series 11: pha_images · axial · 3.0mm · 0.90mm/px · z∈[-129,+21]mm · 5 of 52 slices shown]
[im 1/52]
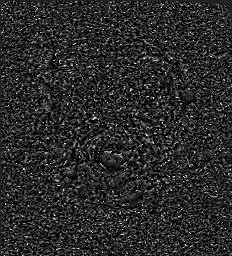
[im 13/52]
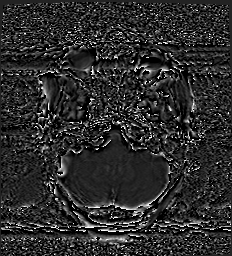
[im 26/52]
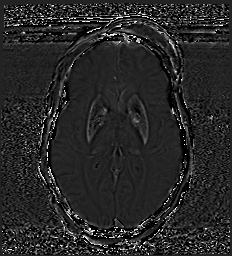
[im 39/52]
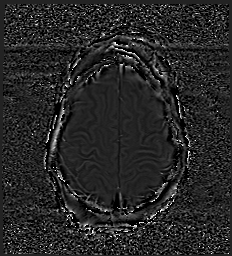
[im 52/52]
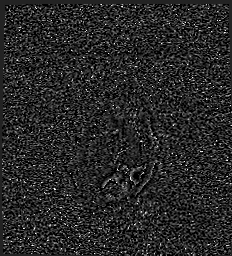

[Series 12: swi_images · axial · 3.0mm · 0.90mm/px · z∈[-129,+21]mm · 5 of 52 slices shown]
[im 1/52]
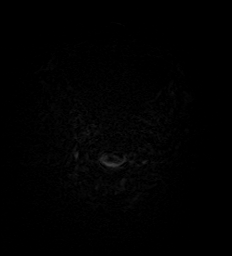
[im 13/52]
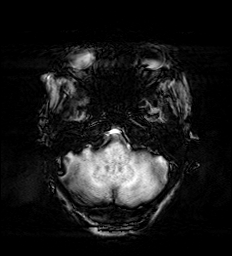
[im 26/52]
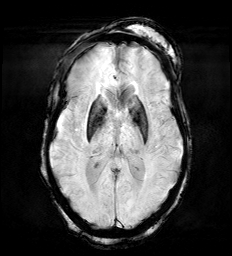
[im 39/52]
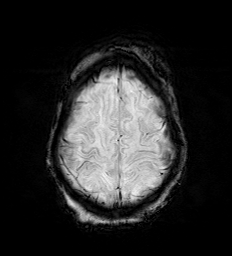
[im 52/52]
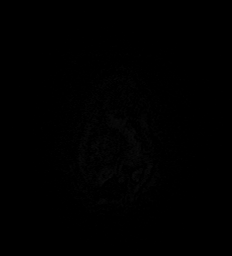

[Series 13: mip_images(sw) · axial · 24.0mm · 0.90mm/px · z∈[-119,+11]mm · 4 of 45 slices shown]
[im 1/45]
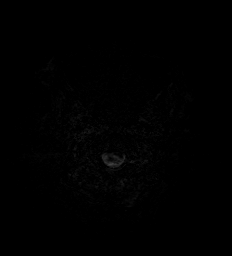
[im 15/45]
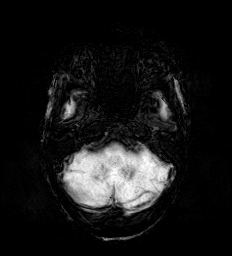
[im 30/45]
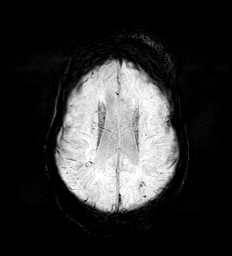
[im 45/45]
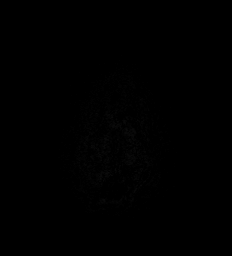

[Series 18: T1 · sagittal · 5.0mm · 0.94mm/px · 2 of 23 slices shown (1 of 2)]
[im 1/23]
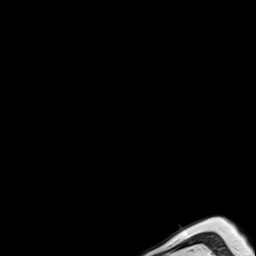
[im 23/23]
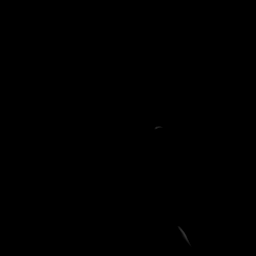

[Series 19: T2 · axial · 5.0mm · 0.45mm/px · z∈[-130,+24]mm · 2 of 27 slices shown (1 of 2)]
[im 1/27]
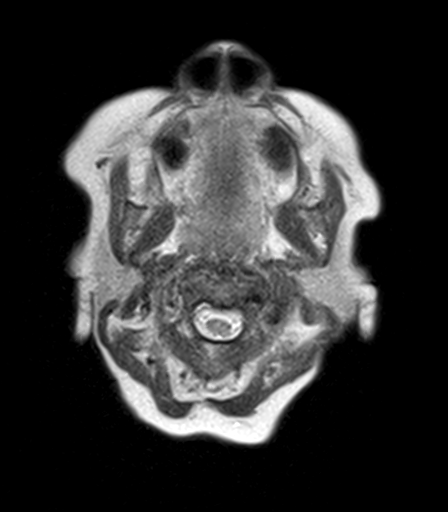
[im 27/27]
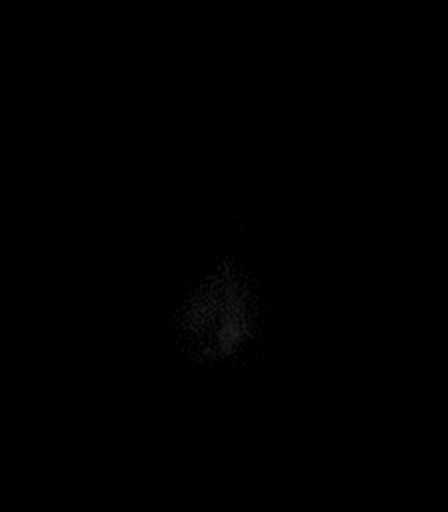

[Series 20: T1 · axial · 5.0mm · 0.90mm/px · z∈[-130,+24]mm · 2 of 27 slices shown (2 of 2)]
[im 1/27]
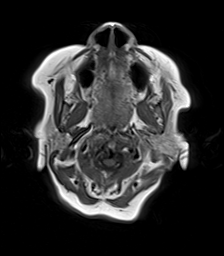
[im 27/27]
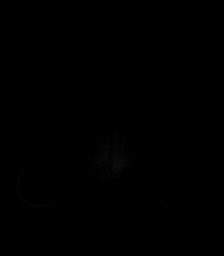

[Series 21: T2 · coronal · 5.0mm · 0.45mm/px · 3 of 31 slices shown (2 of 2)]
[im 1/31]
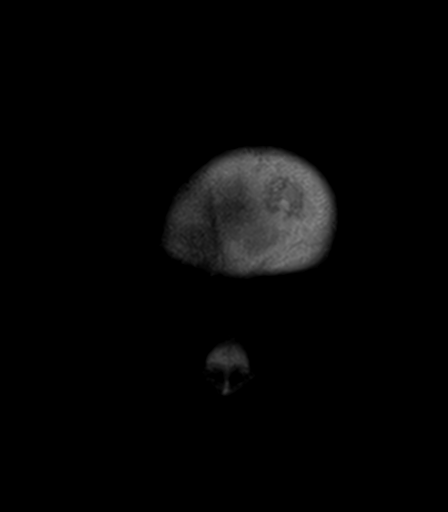
[im 16/31]
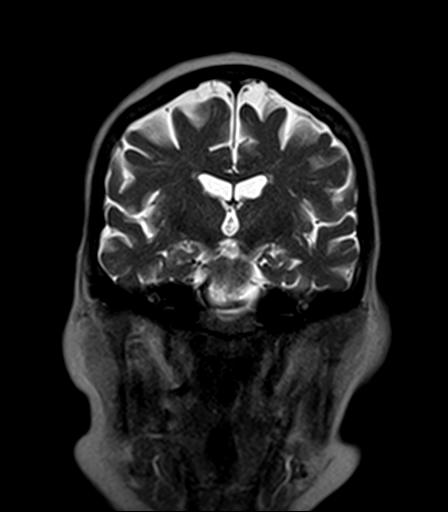
[im 31/31]
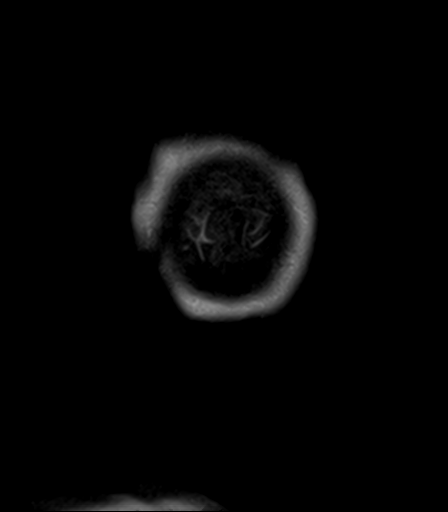

[48 of 48 positions shown; findings below may reference images not displayed]

FINDINGS: MRI HEAD FINDINGS

Brain: There is no evidence of acute intracranial hemorrhage,
extra-axial fluid collection, or acute infarct.

There is mild parenchymal volume loss. The ventricles are normal in
size. Patchy FLAIR signal abnormality in the subcortical and
periventricular white matter is nonspecific but likely reflects
sequela of chronic white matter microangiopathy.

There is no solid mass lesion.  There is no midline shift.

Vascular: Normal flow voids.

Skull and upper cervical spine: There is no marrow signal
abnormality.

Sinuses/Orbits: The paranasal sinuses are clear. The globes and
orbits are unremarkable.

Other: There is a large hematoma in the left forehead.

MRA HEAD FINDINGS

Anterior circulation: There is multifocal irregularity of the
intracranial ICAs likely reflecting atherosclerotic disease. There
is up to moderate stenosis bilaterally. There is a laterally
directed outpouching arising from the left paraophthalmic segment
suspicious for a small aneurysm measuring approximally 2 mm in depth
(5-109).

The bilateral MCAs are patent, without hemodynamically significant
stenosis or occlusion.

The bilateral ACAs are patent. The anterior communicating artery is
patent.

Posterior circulation: The bilateral V4 segments are patent. The
basilar artery is patent. The bilateral PCAs are patent. The
posterior communicating arteries are not definitely seen.

Anatomic variants: None

MRA NECK FINDINGS

The aortic arch is not included within the field of view. The
vessels of the neck are imaged from approximally the C6 level
through the level of the dens.

Right carotid system: The imaged right common, internal, and
external carotid arteries are patent with antegrade flow, without
hemodynamically significant stenosis or occlusion. There is no
evidence of dissection.

Left carotid system: The imaged left common, internal, and external
carotid arteries are patent with antegrade flow, without
hemodynamically significant stenosis or occlusion. There is no
evidence of dissection.

Vertebral arteries: The imaged vertebral arteries are patent with
antegrade flow, without hemodynamically significant stenosis or
occlusion. No evidence of dissection.
IMPRESSION: MR HEAD:

1. No acute intracranial pathology.
2. Mild parenchymal volume loss and chronic white matter
microangiopathy.
3. Large left forehead hematoma.

MRA HEAD:

1. Atherosclerotic irregularity of the bilateral intracranial ICAs
resulting in up to moderate stenosis bilaterally. No other
hemodynamically significant stenosis or occlusion in the
intracranial vasculature.
2. 2 mm laterally directed outpouching arising from the ophthalmic
segment of the left ICA suspicious for a small aneurysm.

MRA NECK:

1. The aortic arch is not included within the field of view. The
vessels of the neck are imaged from approximally the C6 level
through the level of the dens.
2. Patent vasculature of the neck through the imaged portions,
without evidence of hemodynamically significant stenosis, occlusion,
or dissection.

## 2021-08-02 IMAGING — MR MR MRA NECK W/O CM
3 series · 24 of 48 positions shown · non-contrast
Comparison: Same-day noncontrast head CT

CLINICAL DATA: Started feeling dizzy, weakness, fell and hit head



[Series 14: TOF · axial · 0.5mm · 0.54mm/px · z∈[-232,-159]mm · 16 of 178 slices shown]
[im 1/178]
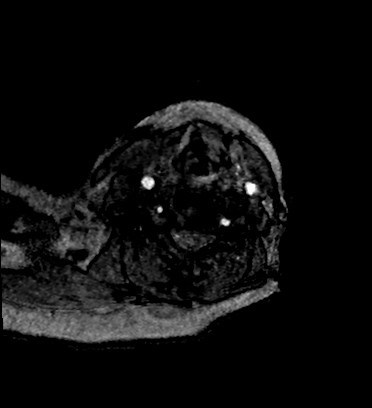
[im 5/178]
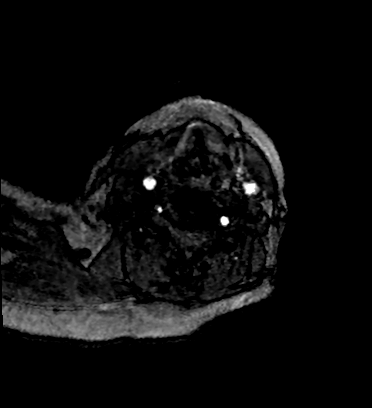
[im 10/178]
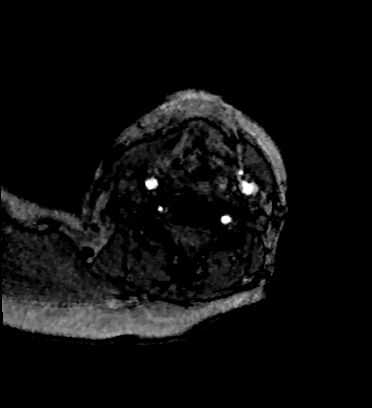
[im 14/178]
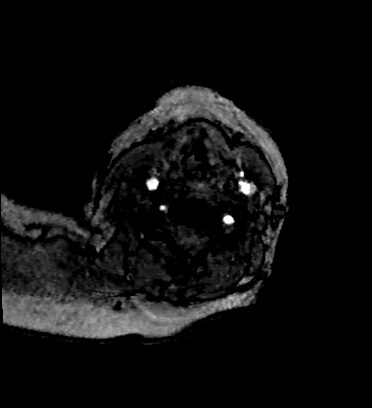
[im 19/178]
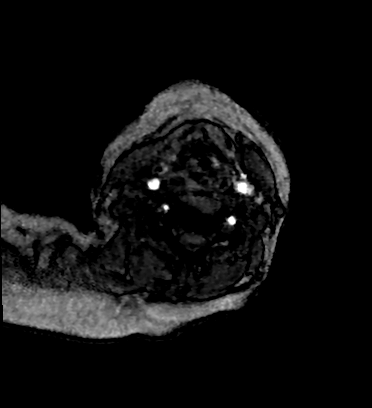
[im 23/178]
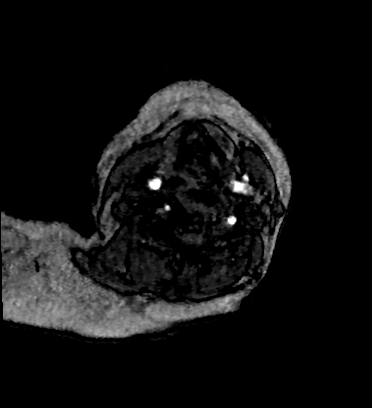
[im 28/178]
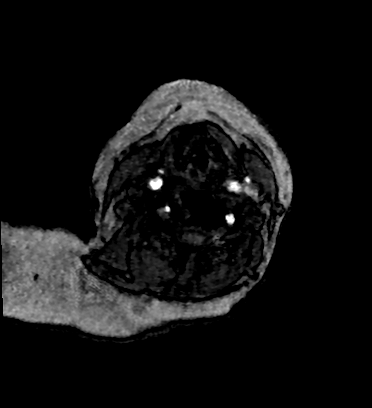
[im 32/178]
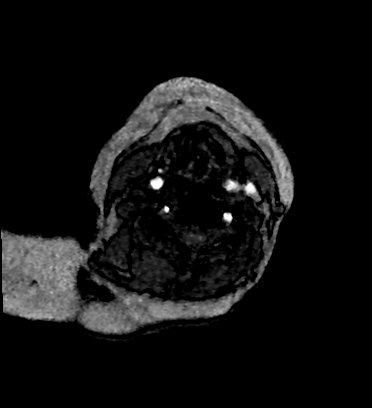
[im 55/178]
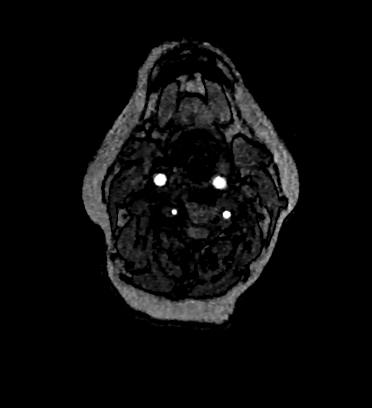
[im 78/178]
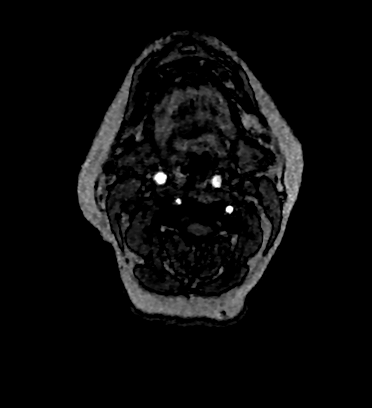
[im 91/178]
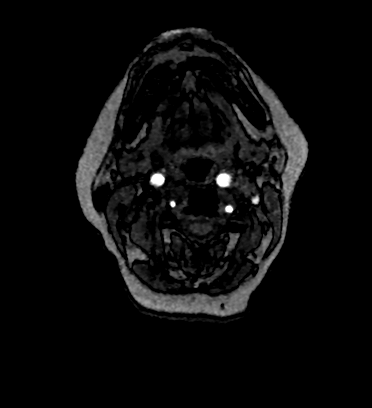
[im 100/178]
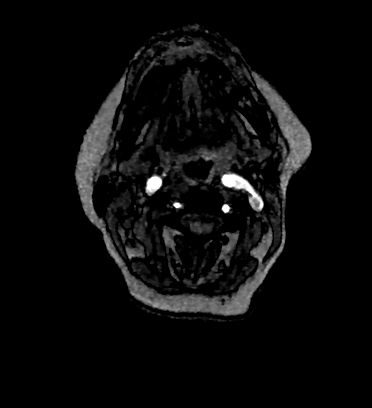
[im 123/178]
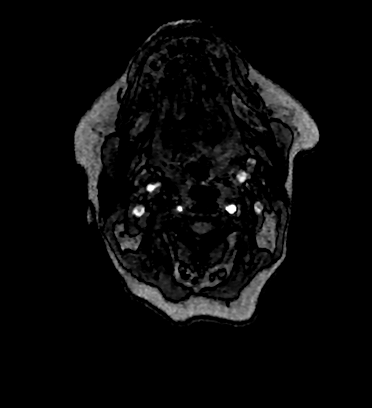
[im 146/178]
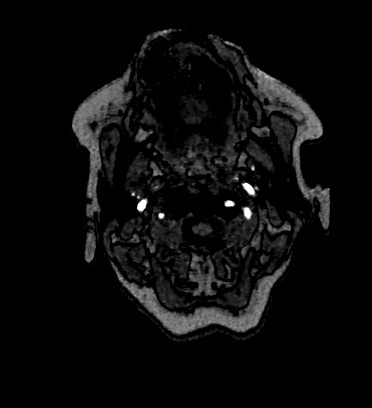
[im 150/178]
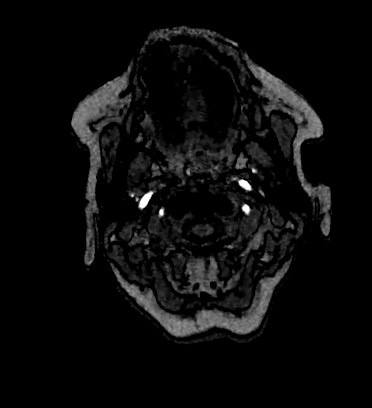
[im 168/178]
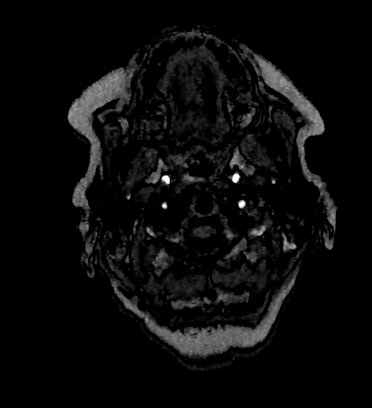

[Series 1174: rcca · 0.43mm/px · 4 of 19 slices shown]
[im 1/19]
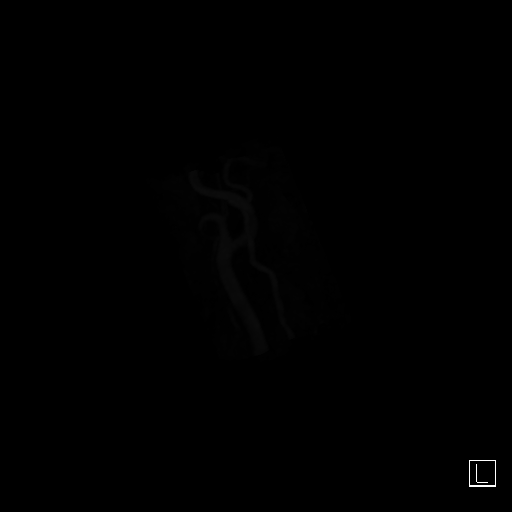
[im 7/19]
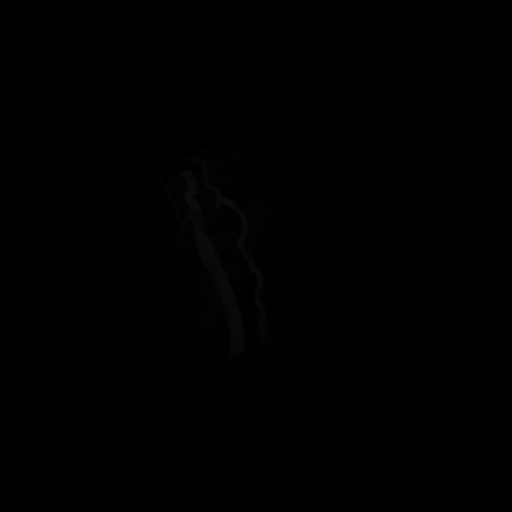
[im 13/19]
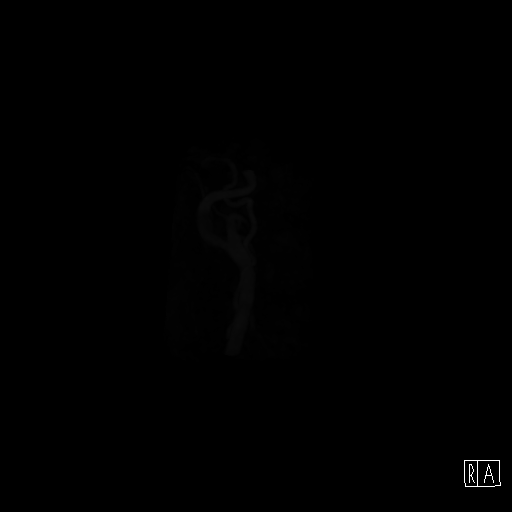
[im 19/19]
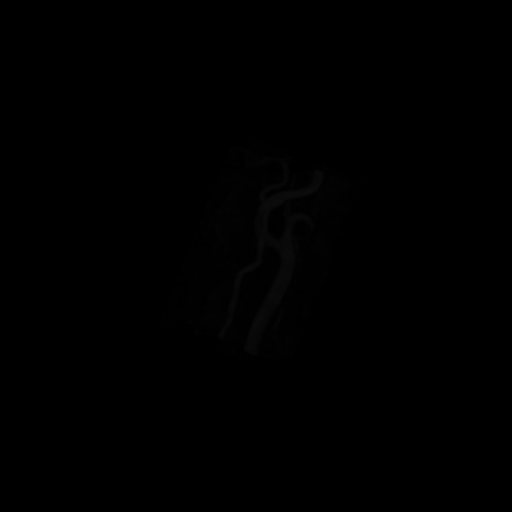

[Series 1178: lcca · 0.43mm/px · 4 of 19 slices shown]
[im 1/19]
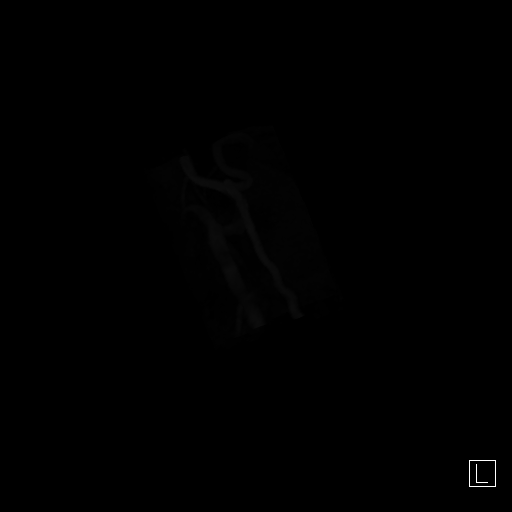
[im 7/19]
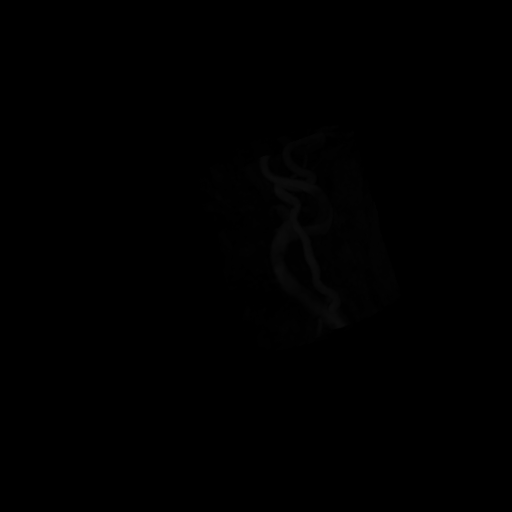
[im 13/19]
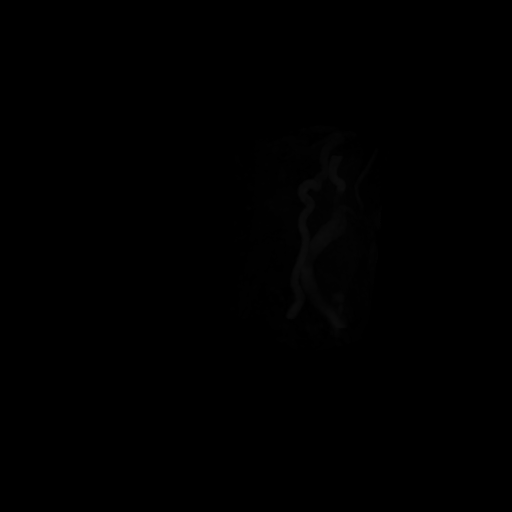
[im 19/19]
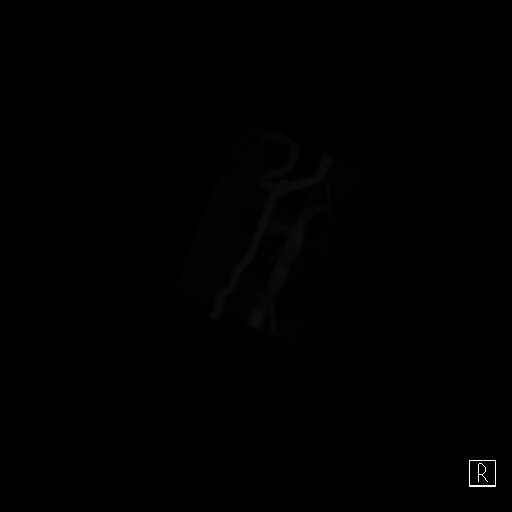

[24 of 48 positions shown; findings below may reference images not displayed]

FINDINGS: MRI HEAD FINDINGS

Brain: There is no evidence of acute intracranial hemorrhage,
extra-axial fluid collection, or acute infarct.

There is mild parenchymal volume loss. The ventricles are normal in
size. Patchy FLAIR signal abnormality in the subcortical and
periventricular white matter is nonspecific but likely reflects
sequela of chronic white matter microangiopathy.

There is no solid mass lesion.  There is no midline shift.

Vascular: Normal flow voids.

Skull and upper cervical spine: There is no marrow signal
abnormality.

Sinuses/Orbits: The paranasal sinuses are clear. The globes and
orbits are unremarkable.

Other: There is a large hematoma in the left forehead.

MRA HEAD FINDINGS

Anterior circulation: There is multifocal irregularity of the
intracranial ICAs likely reflecting atherosclerotic disease. There
is up to moderate stenosis bilaterally. There is a laterally
directed outpouching arising from the left paraophthalmic segment
suspicious for a small aneurysm measuring approximally 2 mm in depth
(5-109).

The bilateral MCAs are patent, without hemodynamically significant
stenosis or occlusion.

The bilateral ACAs are patent. The anterior communicating artery is
patent.

Posterior circulation: The bilateral V4 segments are patent. The
basilar artery is patent. The bilateral PCAs are patent. The
posterior communicating arteries are not definitely seen.

Anatomic variants: None

MRA NECK FINDINGS

The aortic arch is not included within the field of view. The
vessels of the neck are imaged from approximally the C6 level
through the level of the dens.

Right carotid system: The imaged right common, internal, and
external carotid arteries are patent with antegrade flow, without
hemodynamically significant stenosis or occlusion. There is no
evidence of dissection.

Left carotid system: The imaged left common, internal, and external
carotid arteries are patent with antegrade flow, without
hemodynamically significant stenosis or occlusion. There is no
evidence of dissection.

Vertebral arteries: The imaged vertebral arteries are patent with
antegrade flow, without hemodynamically significant stenosis or
occlusion. No evidence of dissection.
IMPRESSION: MR HEAD:

1. No acute intracranial pathology.
2. Mild parenchymal volume loss and chronic white matter
microangiopathy.
3. Large left forehead hematoma.

MRA HEAD:

1. Atherosclerotic irregularity of the bilateral intracranial ICAs
resulting in up to moderate stenosis bilaterally. No other
hemodynamically significant stenosis or occlusion in the
intracranial vasculature.
2. 2 mm laterally directed outpouching arising from the ophthalmic
segment of the left ICA suspicious for a small aneurysm.

MRA NECK:

1. The aortic arch is not included within the field of view. The
vessels of the neck are imaged from approximally the C6 level
through the level of the dens.
2. Patent vasculature of the neck through the imaged portions,
without evidence of hemodynamically significant stenosis, occlusion,
or dissection.

## 2021-08-02 IMAGING — MR MR MRA HEAD W/O CM
1 series · 17 of 48 positions shown · non-contrast
Comparison: Same-day noncontrast head CT

CLINICAL DATA: Started feeling dizzy, weakness, fell and hit head



[Series 5: TOF · axial · 0.5mm · 0.41mm/px · z∈[-131,-35]mm · 17 of 197 slices shown]
[im 1/197]
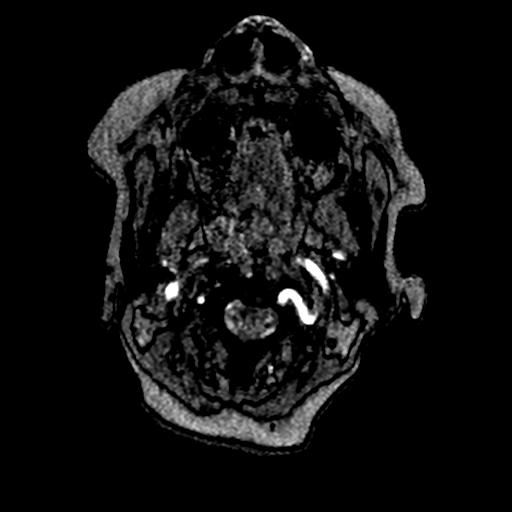
[im 5/197]
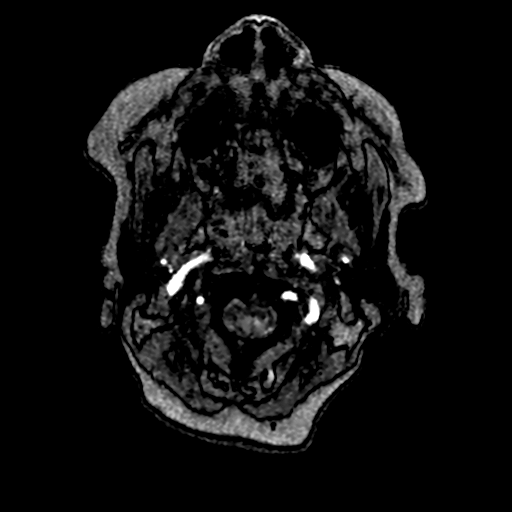
[im 9/197]
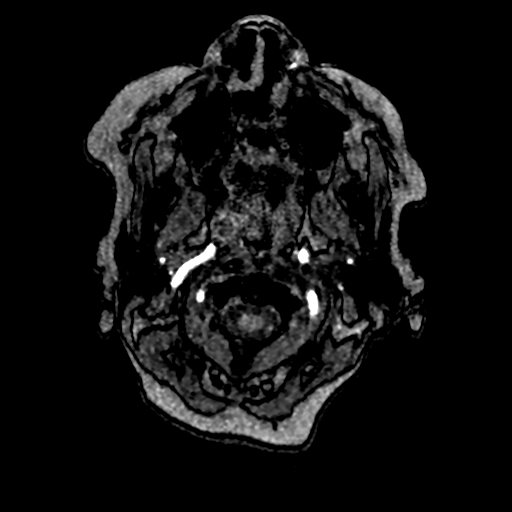
[im 13/197]
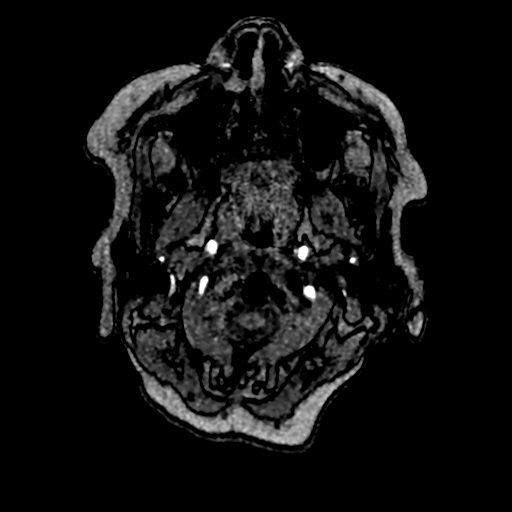
[im 17/197]
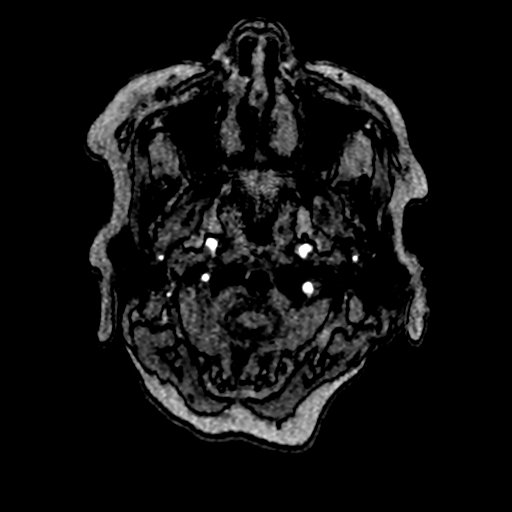
[im 21/197]
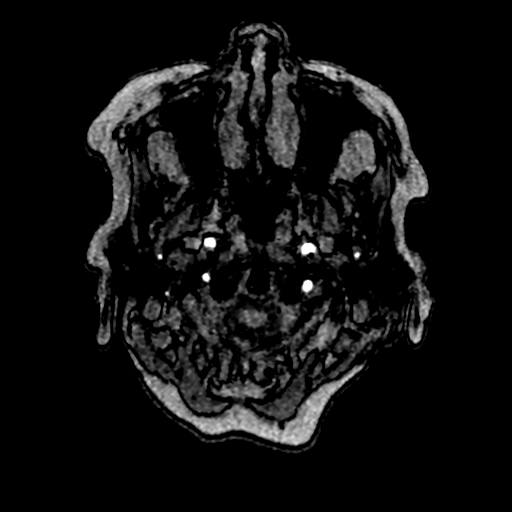
[im 26/197]
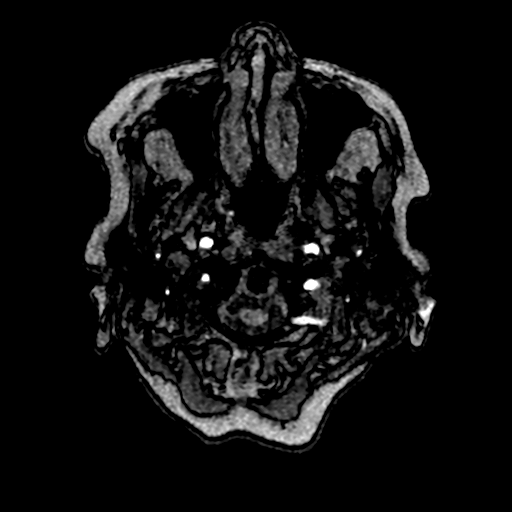
[im 34/197]
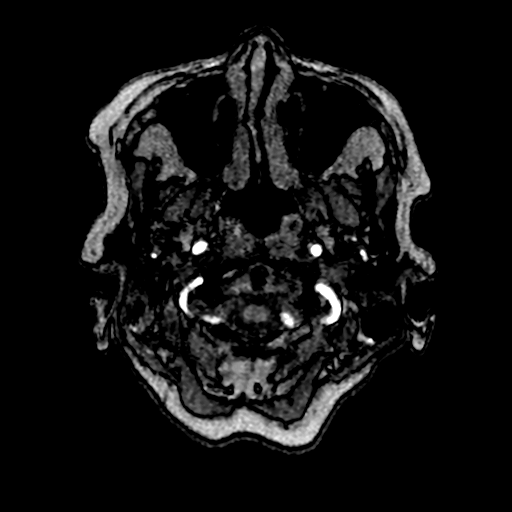
[im 38/197]
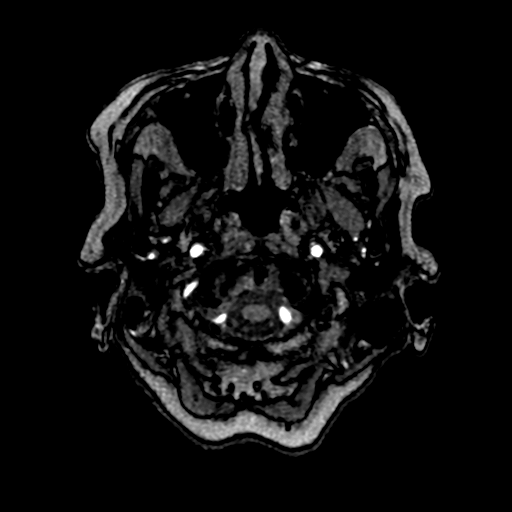
[im 63/197]
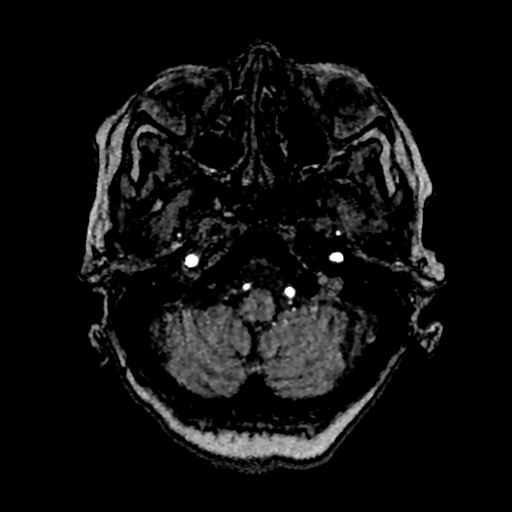
[im 88/197]
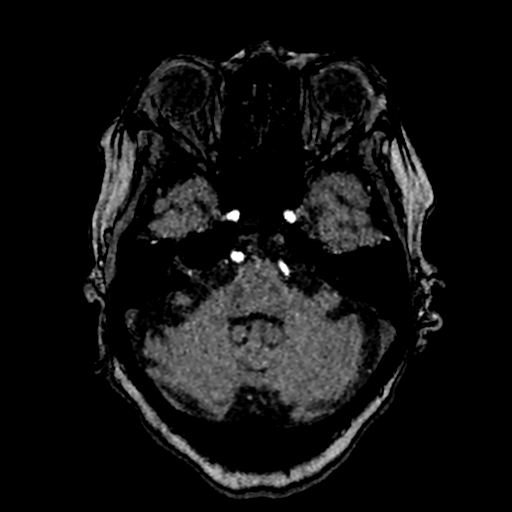
[im 101/197]
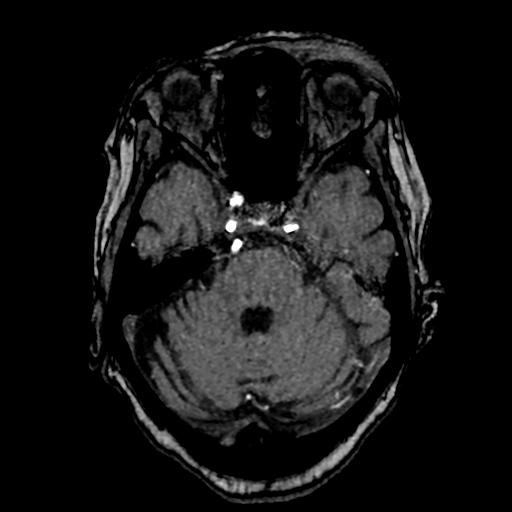
[im 113/197]
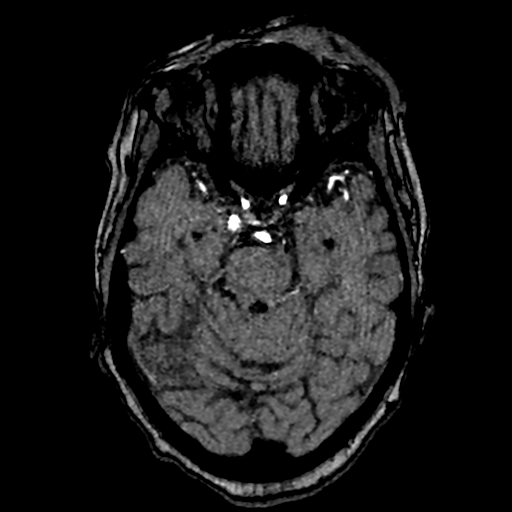
[im 138/197]
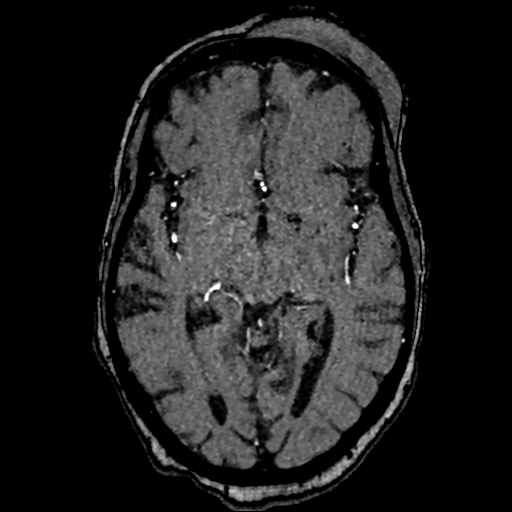
[im 163/197]
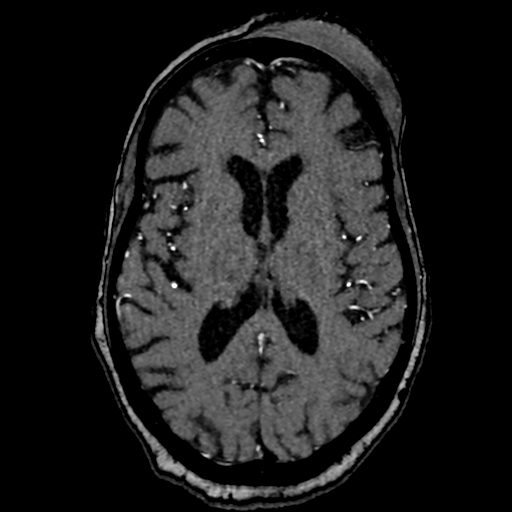
[im 167/197]
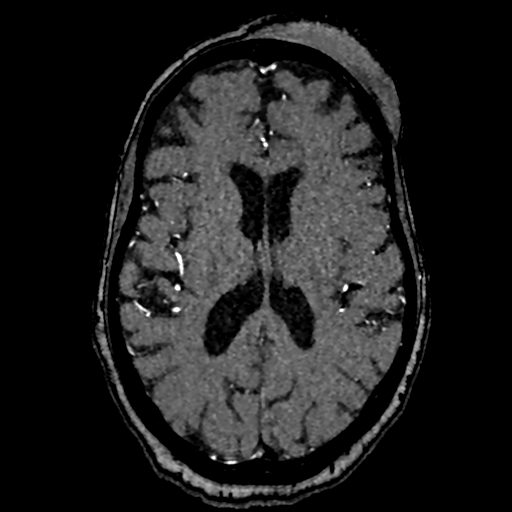
[im 188/197]
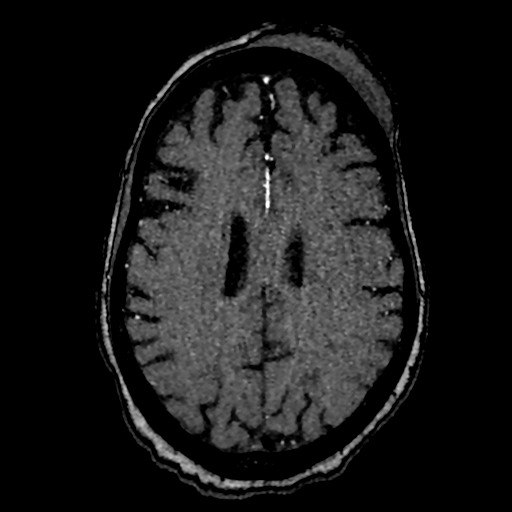

[17 of 48 positions shown; findings below may reference images not displayed]

FINDINGS: MRI HEAD FINDINGS

Brain: There is no evidence of acute intracranial hemorrhage,
extra-axial fluid collection, or acute infarct.

There is mild parenchymal volume loss. The ventricles are normal in
size. Patchy FLAIR signal abnormality in the subcortical and
periventricular white matter is nonspecific but likely reflects
sequela of chronic white matter microangiopathy.

There is no solid mass lesion.  There is no midline shift.

Vascular: Normal flow voids.

Skull and upper cervical spine: There is no marrow signal
abnormality.

Sinuses/Orbits: The paranasal sinuses are clear. The globes and
orbits are unremarkable.

Other: There is a large hematoma in the left forehead.

MRA HEAD FINDINGS

Anterior circulation: There is multifocal irregularity of the
intracranial ICAs likely reflecting atherosclerotic disease. There
is up to moderate stenosis bilaterally. There is a laterally
directed outpouching arising from the left paraophthalmic segment
suspicious for a small aneurysm measuring approximally 2 mm in depth
(5-109).

The bilateral MCAs are patent, without hemodynamically significant
stenosis or occlusion.

The bilateral ACAs are patent. The anterior communicating artery is
patent.

Posterior circulation: The bilateral V4 segments are patent. The
basilar artery is patent. The bilateral PCAs are patent. The
posterior communicating arteries are not definitely seen.

Anatomic variants: None

MRA NECK FINDINGS

The aortic arch is not included within the field of view. The
vessels of the neck are imaged from approximally the C6 level
through the level of the dens.

Right carotid system: The imaged right common, internal, and
external carotid arteries are patent with antegrade flow, without
hemodynamically significant stenosis or occlusion. There is no
evidence of dissection.

Left carotid system: The imaged left common, internal, and external
carotid arteries are patent with antegrade flow, without
hemodynamically significant stenosis or occlusion. There is no
evidence of dissection.

Vertebral arteries: The imaged vertebral arteries are patent with
antegrade flow, without hemodynamically significant stenosis or
occlusion. No evidence of dissection.
IMPRESSION: MR HEAD:

1. No acute intracranial pathology.
2. Mild parenchymal volume loss and chronic white matter
microangiopathy.
3. Large left forehead hematoma.

MRA HEAD:

1. Atherosclerotic irregularity of the bilateral intracranial ICAs
resulting in up to moderate stenosis bilaterally. No other
hemodynamically significant stenosis or occlusion in the
intracranial vasculature.
2. 2 mm laterally directed outpouching arising from the ophthalmic
segment of the left ICA suspicious for a small aneurysm.

MRA NECK:

1. The aortic arch is not included within the field of view. The
vessels of the neck are imaged from approximally the C6 level
through the level of the dens.
2. Patent vasculature of the neck through the imaged portions,
without evidence of hemodynamically significant stenosis, occlusion,
or dissection.

## 2021-08-02 IMAGING — MR MR CERVICAL SPINE W/O CM
5 series · 36 of 48 positions shown · non-contrast
Comparison: CT cervical spine [DATE]. No prior relevant
studies.

CLINICAL DATA: Fall this morning. Dizziness with weakness. Ataxia.
Cervical spine trauma.

EXAM:
MRI CERVICAL SPINE WITHOUT CONTRAST
TECHNIQUE: Multiplanar, multisequence MR imaging of the cervical spine was
performed. No intravenous contrast was administered.

[Series 22: T2 · sagittal · 3.0mm · 0.62mm/px · 6 of 15 slices shown (1 of 2)]
[im 1/15]
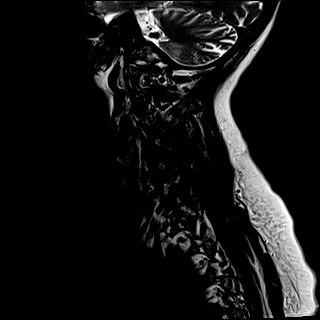
[im 3/15]
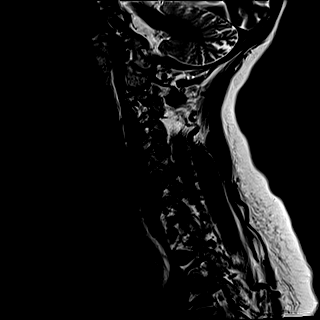
[im 6/15]
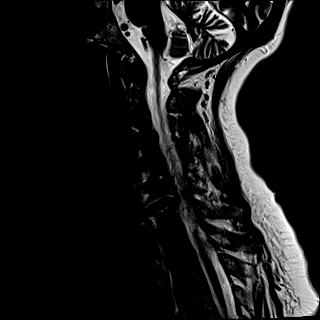
[im 9/15]
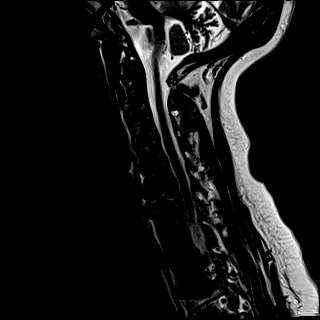
[im 12/15]
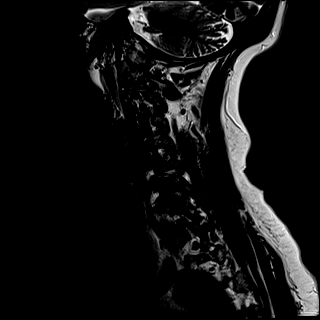
[im 15/15]
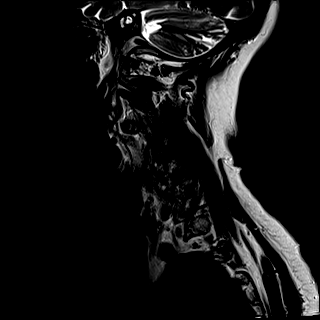

[Series 23: FLAIR · sagittal · 3.0mm · 0.78mm/px · 7 of 15 slices shown]
[im 1/15]
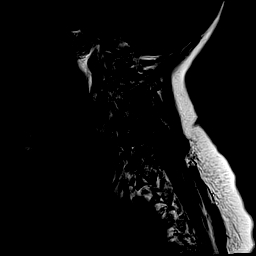
[im 3/15]
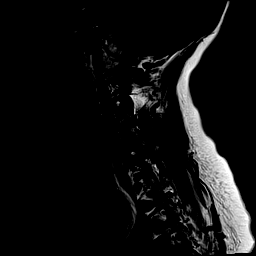
[im 5/15]
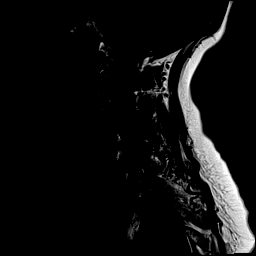
[im 8/15]
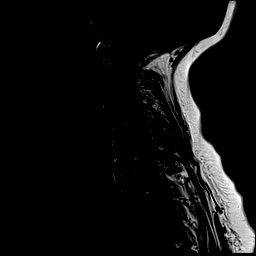
[im 10/15]
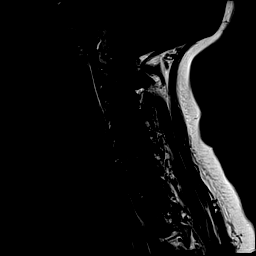
[im 12/15]
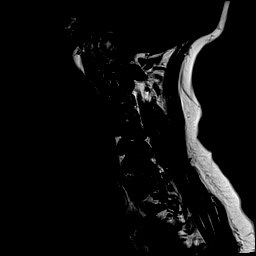
[im 15/15]
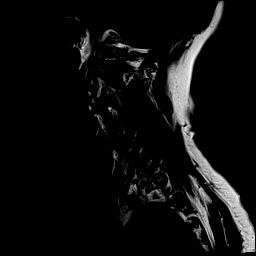

[Series 24: STIR · sagittal · 3.0mm · 0.62mm/px · 7 of 15 slices shown]
[im 1/15]
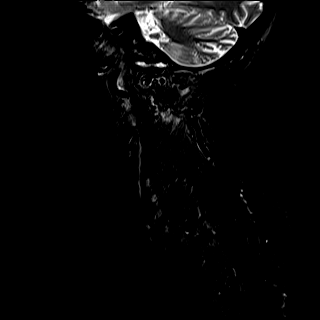
[im 3/15]
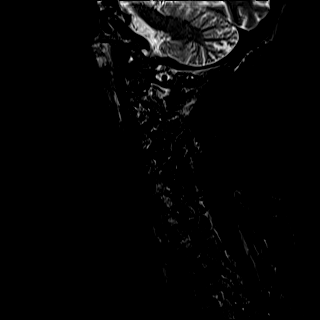
[im 5/15]
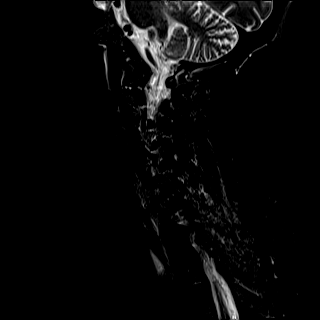
[im 8/15]
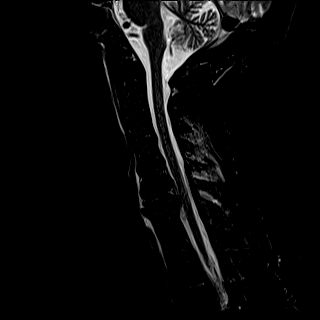
[im 10/15]
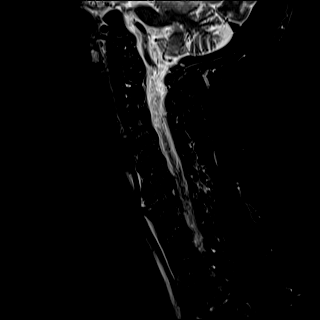
[im 12/15]
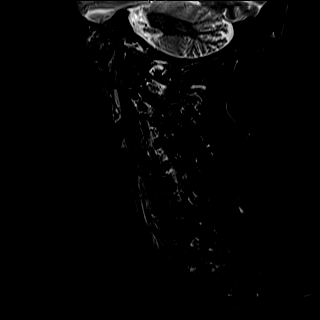
[im 15/15]
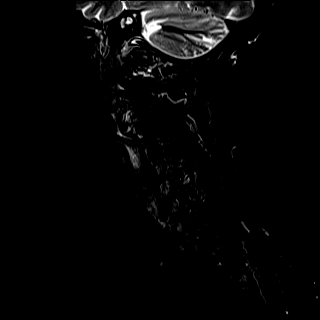

[Series 25: T2 · axial · 3.0mm · 0.70mm/px · z∈[-234,-142]mm · 8 of 29 slices shown (2 of 2)]
[im 1/29]
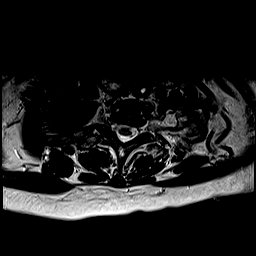
[im 5/29]
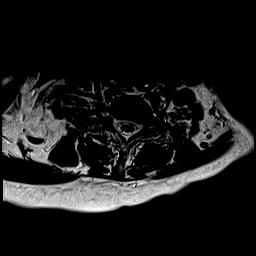
[im 9/29]
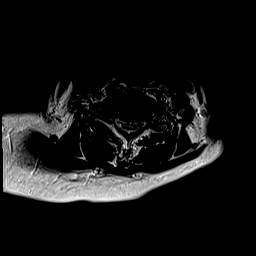
[im 13/29]
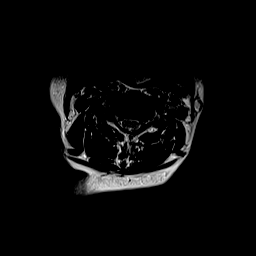
[im 16/29]
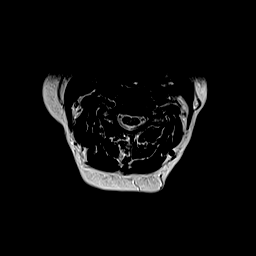
[im 20/29]
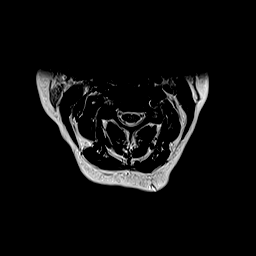
[im 24/29]
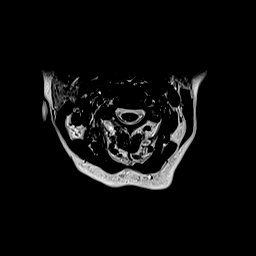
[im 29/29]
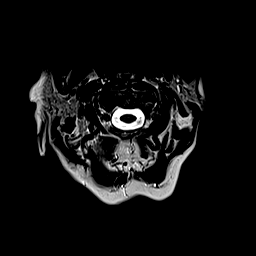

[Series 26: ax mpgr · axial · 3.0mm · 0.35mm/px · z∈[-234,-142]mm · 8 of 29 slices shown]
[im 1/29]
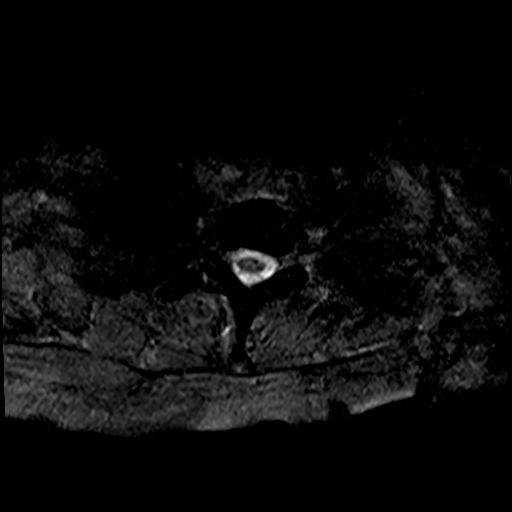
[im 5/29]
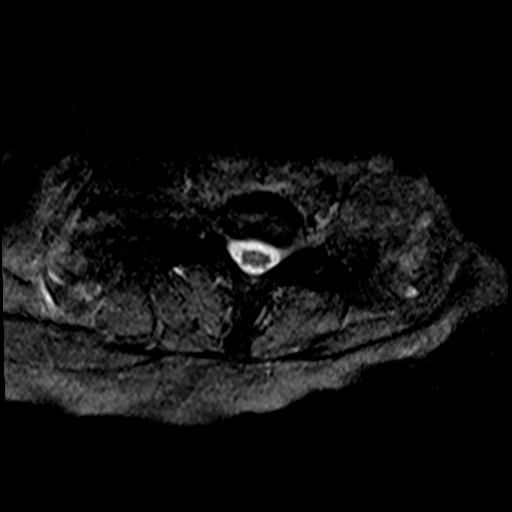
[im 9/29]
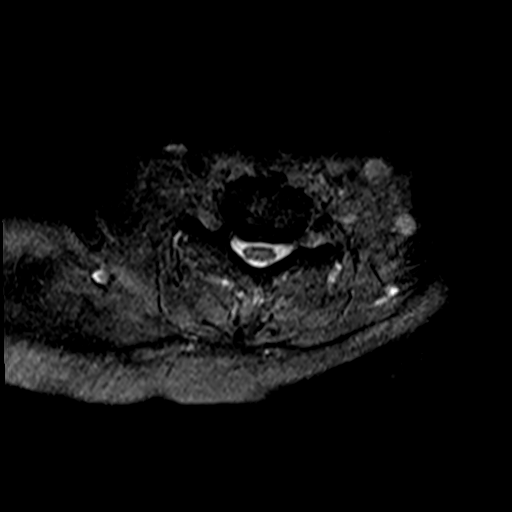
[im 13/29]
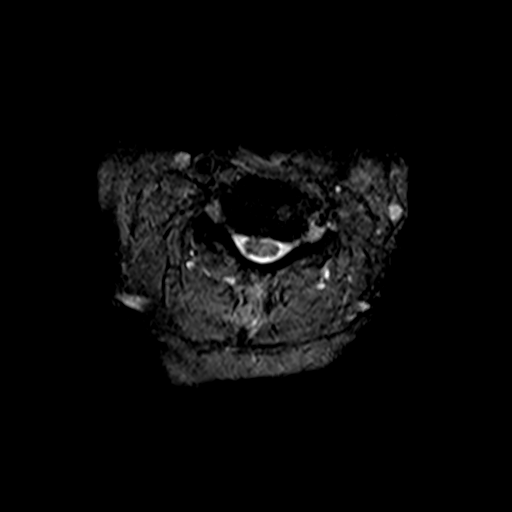
[im 16/29]
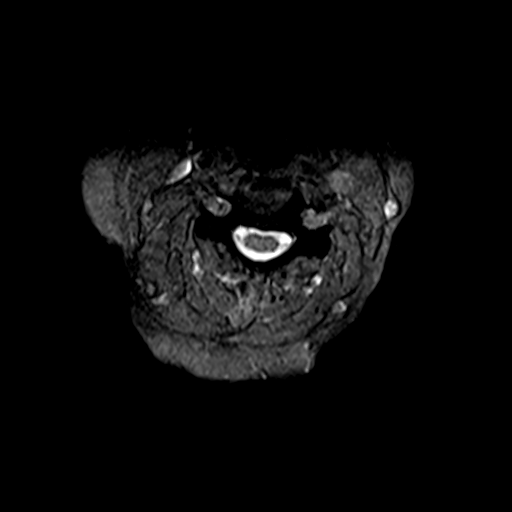
[im 20/29]
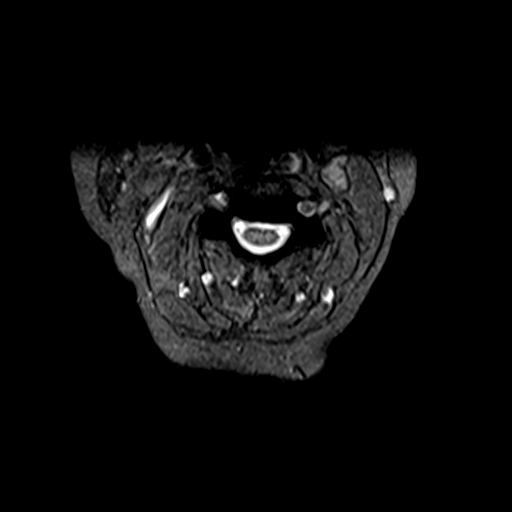
[im 24/29]
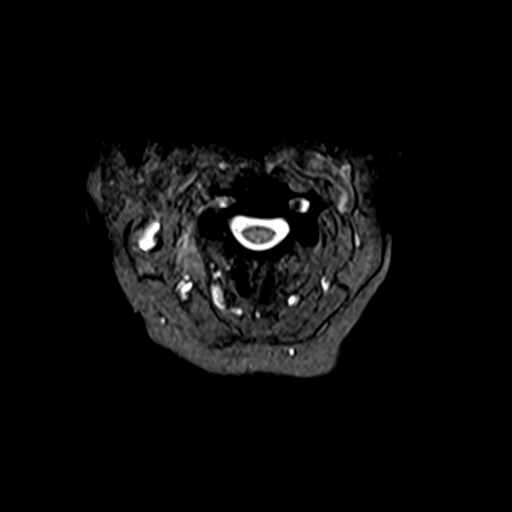
[im 29/29]
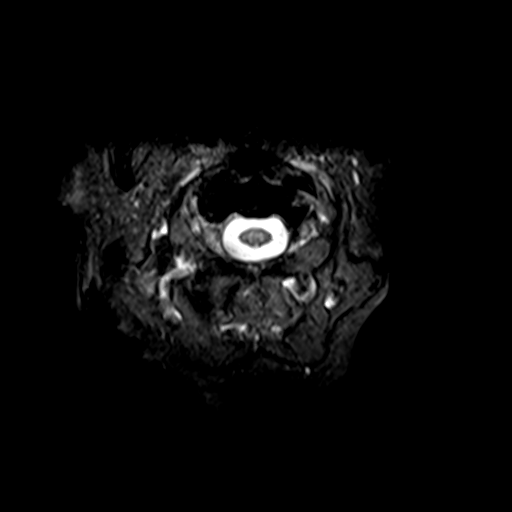

[36 of 48 positions shown; findings below may reference images not displayed]

FINDINGS: Alignment: Straightening without focal angulation or significant
listhesis.

Vertebrae: Vertebral body heights are maintained, and no compression
deformities are identified. There are mild endplate degenerative
changes at C6-7 without suspicious marrow edema. The questioned
small avulsion fracture of the inferior endplate of C6 on CT is
difficult to evaluate by MRI, although there is possible mild
prevertebral soft tissue swelling in this area. The facet joints
appear intact.

Cord: Normal in signal and caliber.

Posterior Fossa, vertebral arteries, paraspinal tissues: Visualized
portions of the posterior fossa appear unremarkable. As above,
possible mild prevertebral soft tissue swelling from C5 through C7.
In addition, there is inter spinous soft tissue edema from C3
through C6. Bilateral vertebral artery flow voids. A large
heterogeneous right thyroid nodule is partially imaged, better seen
on earlier cervical spine CT.

Disc levels:

C2-3: Normal interspace.

C3-4: Mild disc bulging and uncinate spurring. No cord deformity.
Mild foraminal narrowing bilaterally.

C4-5: Mild disc bulging and asymmetric uncinate spurring on the
right. No cord deformity. Mild to moderate right foraminal
narrowing.

C5-6: Loss of disc height with disc bulging and endplate
osteophytes. The CSF surrounding the cord is partially effaced
without cord deformity. Moderate osseous foraminal narrowing
bilaterally.

C6-7: Loss of disc height with disc bulging and endplate
osteophytes. The CSF surrounding the cord is partially effaced
without cord deformity. Moderate to severe right and moderate left
foraminal narrowing.

C7-T1: No significant disc space findings.
IMPRESSION: 1. The MR findings are inconclusive regarding the questioned small
avulsion fracture involving the anterior inferior endplate of C6.
However, there is possible prevertebral soft tissue swelling as well
as mild interspinous edema, findings suggesting hyperflexion injury.
The facet joints appear intact, and there is no evidence of cord
contusion. Recommend immobilization and radiographic follow-up.
2. Multilevel cervical spondylosis as described, greatest at C5-6
and C6-7 where there is associated osseous foraminal narrowing.
3. Complex right thyroid nodule measuring up to 3.7 cm on CT.
Recommend thyroid US.(Ref: [HOSPITAL]. [DATE]):

## 2021-08-02 MED ORDER — OXYCODONE HCL 5 MG PO TABS: 5.0000 mg | ORAL_TABLET | ORAL | Status: DC | PRN

## 2021-08-02 MED ORDER — LEVETIRACETAM 750 MG PO TABS
750.0000 mg | ORAL_TABLET | Freq: Every day | ORAL | Status: DC
  Administered 2021-08-02 – 2021-08-05 (×4): 750 mg via ORAL
  Filled 2021-08-02 (×6): qty 1

## 2021-08-02 MED ORDER — GABAPENTIN 300 MG PO CAPS
300.0000 mg | ORAL_CAPSULE | Freq: Two times a day (BID) | ORAL | Status: DC
  Administered 2021-08-02 – 2021-08-06 (×8): 300 mg via ORAL
  Filled 2021-08-02 (×8): qty 1

## 2021-08-02 MED ORDER — ONDANSETRON HCL 4 MG PO TABS: 4.0000 mg | ORAL_TABLET | Freq: Four times a day (QID) | ORAL | Status: DC | PRN

## 2021-08-02 MED ORDER — CLONAZEPAM 0.5 MG PO TABS
0.5000 mg | ORAL_TABLET | Freq: Two times a day (BID) | ORAL | Status: DC
  Administered 2021-08-02 – 2021-08-03 (×2): 0.5 mg via ORAL
  Filled 2021-08-02 (×2): qty 1

## 2021-08-02 MED ORDER — CARVEDILOL 25 MG PO TABS
25.0000 mg | ORAL_TABLET | Freq: Two times a day (BID) | ORAL | Status: DC
  Administered 2021-08-03: 10:00:00 25 mg via ORAL
  Filled 2021-08-02: qty 1

## 2021-08-02 MED ORDER — INSULIN ASPART 100 UNIT/ML IJ SOLN
0.0000 [IU] | Freq: Three times a day (TID) | INTRAMUSCULAR | Status: DC
Start: 2021-08-03 — End: 2021-08-06
  Filled 2021-08-02 (×3): qty 1

## 2021-08-02 MED ORDER — POLYETHYLENE GLYCOL 3350 17 G PO PACK: 17.0000 g | PACK | Freq: Every day | ORAL | Status: DC | PRN

## 2021-08-02 MED ORDER — LEVETIRACETAM 500 MG PO TABS: 500.0000 mg | ORAL_TABLET | ORAL | Status: DC

## 2021-08-02 MED ORDER — FUROSEMIDE 40 MG PO TABS
80.0000 mg | ORAL_TABLET | Freq: Every day | ORAL | Status: DC
  Administered 2021-08-03: 80 mg via ORAL
  Filled 2021-08-02: qty 2

## 2021-08-02 MED ORDER — LOSARTAN POTASSIUM 50 MG PO TABS
100.0000 mg | ORAL_TABLET | Freq: Every day | ORAL | Status: DC
  Administered 2021-08-03: 10:00:00 100 mg via ORAL
  Filled 2021-08-02: qty 2

## 2021-08-02 MED ORDER — ALBUTEROL SULFATE HFA 108 (90 BASE) MCG/ACT IN AERS: 1.0000 | INHALATION_SPRAY | Freq: Four times a day (QID) | RESPIRATORY_TRACT | Status: DC | PRN

## 2021-08-02 MED ORDER — LACTATED RINGERS IV SOLN: INTRAVENOUS | Status: AC

## 2021-08-02 MED ORDER — LEVETIRACETAM 500 MG PO TABS
500.0000 mg | ORAL_TABLET | Freq: Two times a day (BID) | ORAL | Status: DC
  Administered 2021-08-03 – 2021-08-06 (×7): 500 mg via ORAL
  Filled 2021-08-02 (×8): qty 1

## 2021-08-02 MED ORDER — TRAZODONE HCL 50 MG PO TABS
25.0000 mg | ORAL_TABLET | Freq: Every evening | ORAL | Status: DC | PRN
  Administered 2021-08-03 – 2021-08-04 (×2): 25 mg via ORAL
  Filled 2021-08-02 (×2): qty 1

## 2021-08-02 MED ORDER — ONDANSETRON HCL 4 MG/2ML IJ SOLN: 4.0000 mg | Freq: Four times a day (QID) | INTRAMUSCULAR | Status: DC | PRN

## 2021-08-02 MED ORDER — LACOSAMIDE 50 MG PO TABS
150.0000 mg | ORAL_TABLET | Freq: Two times a day (BID) | ORAL | Status: DC
  Administered 2021-08-03 – 2021-08-06 (×8): 150 mg via ORAL
  Filled 2021-08-02 (×9): qty 3

## 2021-08-02 MED ORDER — EMPAGLIFLOZIN 10 MG PO TABS
10.0000 mg | ORAL_TABLET | Freq: Every day | ORAL | Status: DC
  Administered 2021-08-03: 10:00:00 10 mg via ORAL
  Filled 2021-08-02: qty 1

## 2021-08-02 MED ORDER — SPIRONOLACTONE 25 MG PO TABS
12.5000 mg | ORAL_TABLET | Freq: Every day | ORAL | Status: DC
  Administered 2021-08-03: 12.5 mg via ORAL
  Filled 2021-08-02: qty 1
  Filled 2021-08-02: qty 0.5

## 2021-08-02 MED ORDER — SODIUM CHLORIDE 0.9% FLUSH
3.0000 mL | Freq: Two times a day (BID) | INTRAVENOUS | Status: DC
  Administered 2021-08-03 – 2021-08-05 (×5): 3 mL via INTRAVENOUS

## 2021-08-02 MED ORDER — AMLODIPINE BESYLATE 10 MG PO TABS
10.0000 mg | ORAL_TABLET | Freq: Every day | ORAL | Status: DC
  Administered 2021-08-03: 10:00:00 10 mg via ORAL
  Filled 2021-08-02: qty 1

## 2021-08-02 MED ORDER — ACETAMINOPHEN 325 MG PO TABS
650.0000 mg | ORAL_TABLET | Freq: Four times a day (QID) | ORAL | Status: DC | PRN
  Administered 2021-08-03: 650 mg via ORAL
  Filled 2021-08-02: qty 2

## 2021-08-02 MED ORDER — ALBUTEROL SULFATE (2.5 MG/3ML) 0.083% IN NEBU: 2.5000 mg | INHALATION_SOLUTION | Freq: Four times a day (QID) | RESPIRATORY_TRACT | Status: DC | PRN

## 2021-08-02 MED ORDER — ACETAMINOPHEN 650 MG RE SUPP: 650.0000 mg | Freq: Four times a day (QID) | RECTAL | Status: DC | PRN

## 2021-08-02 NOTE — ED Provider Notes (Signed)
Procedures     ----------------------------------------- 5:07 PM on 08/02/2021 -----------------------------------------  MRI unremarkable except for possible signs of hyperextension injury in area of pain in the c spine. Will need to maintain hard c collar for several weeks for outpt f/u with nsgy.   Plan to hospitalize for further eval of her ataxia.    Carrie Mew, MD 08/02/21 [phone]

## 2021-08-02 NOTE — ED Triage Notes (Signed)
Pt here via EMS from home with c/o fall this am, states she had taken her medication, was watching tv, started feeling dizzy, tried to stand up, was weak, fell and hit her head on the floor. Golf ball size knot above left eyebrow, denies pain at this time. Pt states she is on a blood thinner, speech is garbled and pt admits she doesn't sound like herself. This RN assisted pt to bathroom, pt very unstable, unable to bear weight on legs bilaterally, states she is normally strong and walks daily. Pt states the dizziness and fall occurred around 1130 today. LKW 1125 today. Face symmetrical, Positive left arm drift.

## 2021-08-02 NOTE — Consult Note (Signed)
Neurology Consultation Reason for Consult: Ataxia Referring Physician: Creig Hines  CC: Dizziness  History is obtained from: Patient  HPI: Anne Phillips is a 70 y.o. female who states that she had some dizziness last night, it got much better this morning, though I am not certain if it completely went away, and then she subsequently developed severe dizziness around 1130 this morning.  She continues to be dizzy, and because of this she fell.  She describes dizziness as feeling of extreme unsteadiness.  She has never had this happen before last night.  She denies diplopia, not currently nauseated.   LKW: 12/1 tpa given?: no, outside window   ROS: A 14 point ROS was performed and is negative except as noted in the HPI.  Past Medical History:  Diagnosis Date   Cancer [facility])    Coronary artery disease    Hyperlipidemia    Hypertension    Seizures ([place])    Thyroid disease      Family History  Problem Relation Age of Onset   Hypertension Other      Social History:  reports that she has been smoking cigarettes. She has been smoking an average of 1 pack per day. She has never used smokeless tobacco. She reports that she does not drink alcohol and does not use drugs.   Exam: Current vital signs: BP (!) 142/78   Pulse 90   Temp 98 F (36.7 C) (Oral)   Resp 18   Ht 5\' 2"  (1.575 m)   Wt 59 kg   SpO2 100%   BMI 23.78 kg/m  Vital signs in last 24 hours: Temp:  [98 F (36.7 C)] 98 F (36.7 C) (12/02 1332) Pulse Rate:  [77-90] 90 (12/02 1426) Resp:  [16-18] 18 (12/02 1426) BP: (142-147)/(78-90) 142/78 (12/02 1430) SpO2:  [93 %-100 %] 100 % (12/02 1426) Weight:  [59 kg] 59 kg (12/02 1336)   Physical Exam  Constitutional: Appears well-developed and well-nourished.  Psych: Affect appropriate to situation Eyes: No scleral injection HENT: Developing hematoma over the left brow MSK: no joint deformities.  Cardiovascular: Normal rate and regular rhythm.  Respiratory: Effort  normal, non-labored breathing GI: Soft.  No distension. There is no tenderness.  Skin: WDI  Neuro: Mental Status: Patient is awake, alert, oriented to person, place, month, year, and situation. Patient is able to give a clear and coherent history. No signs of aphasia or neglect Cranial Nerves: II: Visual Fields are full. Pupils are equal, round, and reactive to light.   III,IV, VI: EOMI without ptosis or diploplia.  She has nystagmus. V: Facial sensation is symmetric to temperature VII: Facial movement is symmetric.  VIII: hearing is intact to voice X: Uvula elevates symmetrically XI: Shoulder shrug is symmetric. XII: tongue is midline without atrophy or fasciculations.  Motor: Tone is normal. Bulk is normal. 5/5 strength was present in all four extremities.  Sensory: Sensation is symmetric to light touch and temperature in the arms and legs. Cerebellar: She is ataxic in all four extremities     I have reviewed labs in epic and the results pertinent to this consultation are: Creatinine 1.52 Sodium 131 Ethanol negative  I have reviewed the images obtained: CT head-unremarkable  Impression: 70 year old female with diffuse ataxia.  Given that it is not very focal, my suspicion for stroke is relatively low.  At this point, I would not favor IV tPA.  She attributes this to medication effect, her seizure medicines are not ones that typically cause  ataxia.  Her Neurontin dose is relatively low to cause the severe symptoms.  I do wonder if one of her medications could be causing this given that she had symptoms after medications both times.  Clonazepam certainly could cause symptoms such as this.  Recommendations: 1) pharmacy consult for drugs causing ataxia 2) MRI brain, MRA head and neck 3) further recommendations following the above studies.   Roland Rack, MD [facility] [phone]  If 7pm- 7am, please page neurology on call as listed in [place].

## 2021-08-02 NOTE — ED Notes (Signed)
Initiated code stroke per Dr. Vladimir Crofts to [facility]  1351

## 2021-08-02 NOTE — ED Notes (Signed)
Pt. Ate [place] sandwich tray without difficulty.

## 2021-08-02 NOTE — ED Notes (Addendum)
Pt returned from MRI, placed on bedpan. Pt. Taken off bedpan, 256mL yellow urine.

## 2021-08-02 NOTE — H&P (Signed)
[facility] History and Physical  SHARLENE MCCLUSKEY MGQ:[id number] DOB: 06-07-1951 DOA: 08/02/2021  Referring physician: Dr. Joni Fears PCP: Pcp, No   Chief Complaint: altered mental status  HPI: Anne Phillips is a 70 y.o. female with history of failure with preserved ejection fraction, CAD, NSTEMI, seizure disorder, breast cancer, CKD, depression, hypertension, frequent falls, who presents after a fall with altered mental status.  Patient unable to give a good history and appears somewhat confused.  Hard for me to tell what her baseline is though as this is the first time I have met her.  She reports that she took a medicine she knows she was not supposed to and this is what made her fall.  She reports she has had problem with this medication in the past but she does not remember the name of it.  She denies having any chest pain or shortness of breath.  Unable to elicit more history.  She did report chest discomfort to the ED provider.  In the ED initial vital signs were unremarkable.  Lab work-up notable for mild hyponatremia 131, creatinine at baseline of 1.5, remainder of CMP unremarkable.  CBC unremarkable.  Coags borderline elevated.  Troponin borderline positive at 27, repeat pending UA with positive leukocytes and nitrites.  Urine tox screen was negative.  EKG showing sinus rhythm with T wave inversions in lead I and aVL, previously present but more pronounced today.  She was initially triaged as a code stroke and underwent CT head, CT cervical spine, MR/MRA of the head and neck, no acute findings were found with the exception of a possible small anterior inferior endplate fracture of C6, though imaging was not conclusive.  Incidental thyroid nodule was also seen.  Patient was placed in c-collar.  Neurology was consulted with recommendations pending.  She is admitted for further management.  Review of Systems:  Pertinent positives and negative per HPI, all others reviewed and  negative  Past Medical History:  Diagnosis Date   Cancer ([place])    Coronary artery disease    Hyperlipidemia    Hypertension    Seizures ([place])    Thyroid disease    Past Surgical History:  Procedure Laterality Date   MASTECTOMY     Social History:  reports that she has been smoking cigarettes. She has been smoking an average of 1 pack per day. She has never used smokeless tobacco. She reports that she does not drink alcohol and does not use drugs.  Allergies  Allergen Reactions   Iodinated Diagnostic Agents Hives and Other (See Comments)    Rash     Family History  Problem Relation Age of Onset   Hypertension Other      Prior to Admission medications   Medication Sig Start Date End Date Taking? Authorizing Provider  acetaminophen (TYLENOL) 325 MG tablet Take 2 tablets (650 mg total) by mouth every 6 (six) hours as needed for mild pain (or Fever >/= 101). 10/05/20  Yes Domenic Polite, MD  albuterol (VENTOLIN HFA) 108 (90 Base) MCG/ACT inhaler Inhale 1-2 puffs into the lungs every 6 (six) hours as needed for shortness of breath or wheezing.   Yes [provider]  amLODipine (NORVASC) 10 MG tablet Take 10 mg by mouth daily.   Yes [provider]  atorvastatin (LIPITOR) 40 MG tablet Take 40 mg by mouth daily.   Yes [provider]  carvedilol (COREG) 12.5 MG tablet Take 25 mg by mouth 2 (two) times daily with a meal.  Yes [provider]  clonazePAM (KLONOPIN) 1 MG tablet Take 1 mg by mouth 2 (two) times daily.   Yes [provider]  empagliflozin (JARDIANCE) 10 MG TABS tablet Take 10 mg by mouth daily.   Yes [provider]  furosemide (LASIX) 40 MG tablet Take 80 mg by mouth daily.   Yes [provider]  gabapentin (NEURONTIN) 300 MG capsule Take 1 capsule (300 mg total) by mouth 2 (two) times daily. 10/05/20  Yes Domenic Polite, MD  Lacosamide (VIMPAT) 150 MG TABS Take 150 mg by mouth in the morning and at bedtime.    Yes [provider]  levETIRAcetam (KEPPRA) 500 MG tablet Take 500-750 mg by mouth See admin instructions. Take 1 tablet (537m) by mouth every morning, take 1 tablet (5050m by mouth at midday and take 1 tablets (75050mby mouth every night at bedtime   Yes [provider]  losartan (COZAAR) 100 MG tablet Take 100 mg by mouth daily.   Yes [provider]  spironolactone (ALDACTONE) 25 MG tablet Take 12.5 mg by mouth daily.   Yes [provider]   Physical Exam: Vitals:   08/02/21 1530 08/02/21 1545 08/02/21 1600 08/02/21 1615  BP:      Pulse: 74 71 (!) 107 (!) 117  Resp:      Temp:      TempSrc:      SpO2: 98% 100% 96% (!) 80%  Weight:      Height:        Wt Readings from Last 3 Encounters:  08/02/21 59 kg  04/10/21 57.6 kg  04/05/21 57.6 kg     General:  Appears calm and comfortable, wearing c-collar Eyes: Did not open eyes during encounter though was responsive HEENT: Marked swelling over the left side of the forehead.  Grossly normal hearing, lips & tongue Neck: C-collar in place Cardiovascular: RRR, no m/r/g. No LE edema. Telemetry: SR, no arrhythmias  Respiratory: CTA bilaterally, no w/r/r. Normal respiratory effort. Abdomen: soft, ntnd Skin: no rash or induration seen on limited exam Musculoskeletal: grossly normal tone BUE/BLE Psychiatric: grossly normal mood and affect, speech fluent but slightly slurred Neurologic: grossly non-focal.  Strength 5 out of 5 in bilateral upper and lower extremities          Labs on Admission:  Basic Metabolic Panel: Recent Labs  Lab 08/02/21 1433  NA 131*  K 3.5  CL 97*  CO2 23  GLUCOSE 98  BUN 29*  CREATININE 1.52*  CALCIUM 9.1   Liver Function Tests: Recent Labs  Lab 08/02/21 1433  AST 19  ALT 9  ALKPHOS 130*  BILITOT 0.7  PROT 7.9  ALBUMIN 4.4   No results for input(s): LIPASE, AMYLASE in the last 168 hours. No results for input(s): AMMONIA in the last 168  hours. CBC: Recent Labs  Lab 08/02/21 1433  WBC 7.4  NEUTROABS 6.4  HGB 11.3*  HCT 34.7*  MCV 89.9  PLT 174   Cardiac Enzymes: No results for input(s): CKTOTAL, CKMB, CKMBINDEX, TROPONINI in the last 168 hours.  BNP (last 3 results) No results for input(s): BNP in the last 8760 hours.  ProBNP (last 3 results) No results for input(s): PROBNP in the last 8760 hours.  CBG: Recent Labs  Lab 08/02/21 1336  GLUCAP 104*    Radiological Exams on Admission: CT Cervical Spine Wo Contrast  Result Date: 08/02/2021 CLINICAL DATA:  Neck trauma (Age >= 65y) EXAM: CT CERVICAL SPINE WITHOUT CONTRAST TECHNIQUE: Multidetector  CT imaging of the cervical spine was performed without intravenous contrast. Multiplanar CT image reconstructions were also generated. COMPARISON:  None. FINDINGS: Alignment: Straightening of the normal cervical lordosis likely due to patient positioning. Skull base and vertebrae: There is a possible small anterior inferior endplate fracture of C6, with a non corticated margin. This was identified as well on [place]. Soft tissues and spinal canal: No prevertebral fluid or swelling. No visible canal hematoma. Disc levels: There is multilevel degenerative disc disease, moderate at C5-C6 and C6-C7. Posterior disc osteophyte complexes at these levels. Upper chest: Negative Other: There are multiple hypodense thyroid nodules bilaterally, largest measuring up to 3.7 cm in the right thyroid lobe. IMPRESSION: Possible small anterior inferior endplate fracture of C6. This could represent sequela of degenerative osteophyte formation but there appears to be a non-corticated margin. No priors for comparison. Consider MRI for further evaluation. Electronically Signed   By: Maurine Simmering M.D.   On: 08/02/2021 14:26   MR ANGIO HEAD WO CONTRAST  Result Date: 08/02/2021 CLINICAL DATA:  Started feeling dizzy, weakness, fell and hit head EXAM: MRI HEAD WITHOUT CONTRAST MRA HEAD WITHOUT  CONTRAST MRA NECK WITHOUT CONTRAST TECHNIQUE: Multiplanar, multiecho pulse sequences of the brain and surrounding structures were obtained without intravenous contrast. Angiographic images of the Circle of Willis were obtained using MRA technique without intravenous contrast. Angiographic images of the neck were obtained using MRA technique without intravenous contrast. Carotid stenosis measurements (when applicable) are obtained utilizing NASCET criteria, using the distal internal carotid diameter as the denominator. COMPARISON:  Same-day noncontrast head CT FINDINGS: MRI HEAD FINDINGS Brain: There is no evidence of acute intracranial hemorrhage, extra-axial fluid collection, or acute infarct. There is mild parenchymal volume loss. The ventricles are normal in size. Patchy FLAIR signal abnormality in the subcortical and periventricular white matter is nonspecific but likely reflects sequela of chronic white matter microangiopathy. There is no solid mass lesion.  There is no midline shift. Vascular: Normal flow voids. Skull and upper cervical spine: There is no marrow signal abnormality. Sinuses/Orbits: The paranasal sinuses are clear. The globes and orbits are unremarkable. Other: There is a large hematoma in the left forehead. MRA HEAD FINDINGS Anterior circulation: There is multifocal irregularity of the intracranial ICAs likely reflecting atherosclerotic disease. There is up to moderate stenosis bilaterally. There is a laterally directed outpouching arising from the left paraophthalmic segment suspicious for a small aneurysm measuring approximally 2 mm in depth (5-109). The bilateral MCAs are patent, without hemodynamically significant stenosis or occlusion. The bilateral ACAs are patent. The anterior communicating artery is patent. Posterior circulation: The bilateral V4 segments are patent. The basilar artery is patent. The bilateral PCAs are patent. The posterior communicating arteries are not definitely  seen. Anatomic variants: None MRA NECK FINDINGS The aortic arch is not included within the field of view. The vessels of the neck are imaged from approximally the C6 level through the level of the dens. Right carotid system: The imaged right common, internal, and external carotid arteries are patent with antegrade flow, without hemodynamically significant stenosis or occlusion. There is no evidence of dissection. Left carotid system: The imaged left common, internal, and external carotid arteries are patent with antegrade flow, without hemodynamically significant stenosis or occlusion. There is no evidence of dissection. Vertebral arteries: The imaged vertebral arteries are patent with antegrade flow, without hemodynamically significant stenosis or occlusion. No evidence of dissection. IMPRESSION: MR HEAD: 1. No acute intracranial pathology. 2. Mild parenchymal volume loss and chronic white  matter microangiopathy. 3. Large left forehead hematoma. MRA HEAD: 1. Atherosclerotic irregularity of the bilateral intracranial ICAs resulting in up to moderate stenosis bilaterally. No other hemodynamically significant stenosis or occlusion in the intracranial vasculature. 2. 2 mm laterally directed outpouching arising from the ophthalmic segment of the left ICA suspicious for a small aneurysm. MRA NECK: 1. The aortic arch is not included within the field of view. The vessels of the neck are imaged from approximally the C6 level through the level of the dens. 2. Patent vasculature of the neck through the imaged portions, without evidence of hemodynamically significant stenosis, occlusion, or dissection. Electronically Signed   By: Valetta Mole M.D.   On: 08/02/2021 16:10   MR ANGIO NECK WO CONTRAST  Result Date: 08/02/2021 CLINICAL DATA:  Started feeling dizzy, weakness, fell and hit head EXAM: MRI HEAD WITHOUT CONTRAST MRA HEAD WITHOUT CONTRAST MRA NECK WITHOUT CONTRAST TECHNIQUE: Multiplanar, multiecho pulse sequences of  the brain and surrounding structures were obtained without intravenous contrast. Angiographic images of the Circle of Willis were obtained using MRA technique without intravenous contrast. Angiographic images of the neck were obtained using MRA technique without intravenous contrast. Carotid stenosis measurements (when applicable) are obtained utilizing NASCET criteria, using the distal internal carotid diameter as the denominator. COMPARISON:  Same-day noncontrast head CT FINDINGS: MRI HEAD FINDINGS Brain: There is no evidence of acute intracranial hemorrhage, extra-axial fluid collection, or acute infarct. There is mild parenchymal volume loss. The ventricles are normal in size. Patchy FLAIR signal abnormality in the subcortical and periventricular white matter is nonspecific but likely reflects sequela of chronic white matter microangiopathy. There is no solid mass lesion.  There is no midline shift. Vascular: Normal flow voids. Skull and upper cervical spine: There is no marrow signal abnormality. Sinuses/Orbits: The paranasal sinuses are clear. The globes and orbits are unremarkable. Other: There is a large hematoma in the left forehead. MRA HEAD FINDINGS Anterior circulation: There is multifocal irregularity of the intracranial ICAs likely reflecting atherosclerotic disease. There is up to moderate stenosis bilaterally. There is a laterally directed outpouching arising from the left paraophthalmic segment suspicious for a small aneurysm measuring approximally 2 mm in depth (5-109). The bilateral MCAs are patent, without hemodynamically significant stenosis or occlusion. The bilateral ACAs are patent. The anterior communicating artery is patent. Posterior circulation: The bilateral V4 segments are patent. The basilar artery is patent. The bilateral PCAs are patent. The posterior communicating arteries are not definitely seen. Anatomic variants: None MRA NECK FINDINGS The aortic arch is not included within the  field of view. The vessels of the neck are imaged from approximally the C6 level through the level of the dens. Right carotid system: The imaged right common, internal, and external carotid arteries are patent with antegrade flow, without hemodynamically significant stenosis or occlusion. There is no evidence of dissection. Left carotid system: The imaged left common, internal, and external carotid arteries are patent with antegrade flow, without hemodynamically significant stenosis or occlusion. There is no evidence of dissection. Vertebral arteries: The imaged vertebral arteries are patent with antegrade flow, without hemodynamically significant stenosis or occlusion. No evidence of dissection. IMPRESSION: MR HEAD: 1. No acute intracranial pathology. 2. Mild parenchymal volume loss and chronic white matter microangiopathy. 3. Large left forehead hematoma. MRA HEAD: 1. Atherosclerotic irregularity of the bilateral intracranial ICAs resulting in up to moderate stenosis bilaterally. No other hemodynamically significant stenosis or occlusion in the intracranial vasculature. 2. 2 mm laterally directed outpouching arising from the ophthalmic segment  of the left ICA suspicious for a small aneurysm. MRA NECK: 1. The aortic arch is not included within the field of view. The vessels of the neck are imaged from approximally the C6 level through the level of the dens. 2. Patent vasculature of the neck through the imaged portions, without evidence of hemodynamically significant stenosis, occlusion, or dissection. Electronically Signed   By: Valetta Mole M.D.   On: 08/02/2021 16:10   MR BRAIN WO CONTRAST  Result Date: 08/02/2021 CLINICAL DATA:  Started feeling dizzy, weakness, fell and hit head EXAM: MRI HEAD WITHOUT CONTRAST MRA HEAD WITHOUT CONTRAST MRA NECK WITHOUT CONTRAST TECHNIQUE: Multiplanar, multiecho pulse sequences of the brain and surrounding structures were obtained without intravenous contrast. Angiographic  images of the Circle of Willis were obtained using MRA technique without intravenous contrast. Angiographic images of the neck were obtained using MRA technique without intravenous contrast. Carotid stenosis measurements (when applicable) are obtained utilizing NASCET criteria, using the distal internal carotid diameter as the denominator. COMPARISON:  Same-day noncontrast head CT FINDINGS: MRI HEAD FINDINGS Brain: There is no evidence of acute intracranial hemorrhage, extra-axial fluid collection, or acute infarct. There is mild parenchymal volume loss. The ventricles are normal in size. Patchy FLAIR signal abnormality in the subcortical and periventricular white matter is nonspecific but likely reflects sequela of chronic white matter microangiopathy. There is no solid mass lesion.  There is no midline shift. Vascular: Normal flow voids. Skull and upper cervical spine: There is no marrow signal abnormality. Sinuses/Orbits: The paranasal sinuses are clear. The globes and orbits are unremarkable. Other: There is a large hematoma in the left forehead. MRA HEAD FINDINGS Anterior circulation: There is multifocal irregularity of the intracranial ICAs likely reflecting atherosclerotic disease. There is up to moderate stenosis bilaterally. There is a laterally directed outpouching arising from the left paraophthalmic segment suspicious for a small aneurysm measuring approximally 2 mm in depth (5-109). The bilateral MCAs are patent, without hemodynamically significant stenosis or occlusion. The bilateral ACAs are patent. The anterior communicating artery is patent. Posterior circulation: The bilateral V4 segments are patent. The basilar artery is patent. The bilateral PCAs are patent. The posterior communicating arteries are not definitely seen. Anatomic variants: None MRA NECK FINDINGS The aortic arch is not included within the field of view. The vessels of the neck are imaged from approximally the C6 level through the  level of the dens. Right carotid system: The imaged right common, internal, and external carotid arteries are patent with antegrade flow, without hemodynamically significant stenosis or occlusion. There is no evidence of dissection. Left carotid system: The imaged left common, internal, and external carotid arteries are patent with antegrade flow, without hemodynamically significant stenosis or occlusion. There is no evidence of dissection. Vertebral arteries: The imaged vertebral arteries are patent with antegrade flow, without hemodynamically significant stenosis or occlusion. No evidence of dissection. IMPRESSION: MR HEAD: 1. No acute intracranial pathology. 2. Mild parenchymal volume loss and chronic white matter microangiopathy. 3. Large left forehead hematoma. MRA HEAD: 1. Atherosclerotic irregularity of the bilateral intracranial ICAs resulting in up to moderate stenosis bilaterally. No other hemodynamically significant stenosis or occlusion in the intracranial vasculature. 2. 2 mm laterally directed outpouching arising from the ophthalmic segment of the left ICA suspicious for a small aneurysm. MRA NECK: 1. The aortic arch is not included within the field of view. The vessels of the neck are imaged from approximally the C6 level through the level of the dens. 2. Patent vasculature of the neck  through the imaged portions, without evidence of hemodynamically significant stenosis, occlusion, or dissection. Electronically Signed   By: Valetta Mole M.D.   On: 08/02/2021 16:10   MR Cervical Spine Wo Contrast  Result Date: 08/02/2021 CLINICAL DATA:  Fall this morning. Dizziness with weakness. Ataxia. Cervical spine trauma. EXAM: MRI CERVICAL SPINE WITHOUT CONTRAST TECHNIQUE: Multiplanar, multisequence MR imaging of the cervical spine was performed. No intravenous contrast was administered. COMPARISON:  CT cervical spine 08/02/2021. No prior relevant studies. FINDINGS: Alignment: Straightening without focal  angulation or significant listhesis. Vertebrae: Vertebral body heights are maintained, and no compression deformities are identified. There are mild endplate degenerative changes at C6-7 without suspicious marrow edema. The questioned small avulsion fracture of the inferior endplate of C6 on CT is difficult to evaluate by MRI, although there is possible mild prevertebral soft tissue swelling in this area. The facet joints appear intact. Cord: Normal in signal and caliber. Posterior Fossa, vertebral arteries, paraspinal tissues: Visualized portions of the posterior fossa appear unremarkable. As above, possible mild prevertebral soft tissue swelling from C5 through C7. In addition, there is inter spinous soft tissue edema from C3 through C6. Bilateral vertebral artery flow voids. A large heterogeneous right thyroid nodule is partially imaged, better seen on earlier cervical spine CT. Disc levels: C2-3: Normal interspace. C3-4: Mild disc bulging and uncinate spurring. No cord deformity. Mild foraminal narrowing bilaterally. C4-5: Mild disc bulging and asymmetric uncinate spurring on the right. No cord deformity. Mild to moderate right foraminal narrowing. C5-6: Loss of disc height with disc bulging and endplate osteophytes. The CSF surrounding the cord is partially effaced without cord deformity. Moderate osseous foraminal narrowing bilaterally. C6-7: Loss of disc height with disc bulging and endplate osteophytes. The CSF surrounding the cord is partially effaced without cord deformity. Moderate to severe right and moderate left foraminal narrowing. C7-T1: No significant disc space findings. IMPRESSION: 1. The MR findings are inconclusive regarding the questioned small avulsion fracture involving the anterior inferior endplate of C6. However, there is possible prevertebral soft tissue swelling as well as mild interspinous edema, findings suggesting hyperflexion injury. The facet joints appear intact, and there is no  evidence of cord contusion. Recommend immobilization and radiographic follow-up. 2. Multilevel cervical spondylosis as described, greatest at C5-6 and C6-7 where there is associated osseous foraminal narrowing. 3. Complex right thyroid nodule measuring up to 3.7 cm on CT. Recommend thyroid US.(Ref: J Am Coll Radiol. 2015 Feb;12(2): 143-50). Electronically Signed   By: Richardean Sale M.D.   On: 08/02/2021 15:58   DG Chest Portable 1 View  Result Date: 08/02/2021 CLINICAL DATA:  Fall, altered mental status EXAM: PORTABLE CHEST 1 VIEW COMPARISON:  04/10/2021 FINDINGS: Unchanged cardiomediastinal silhouette. There is no new airspace disease. There is no large pleural effusion. No visible pneumothorax. Unchanged upper right mediastinal prominence due to enlarged thyroid, with leftward tracheal deviation. Left axillary surgical clips. There is no acute osseous abnormality. IMPRESSION: No evidence of acute cardiopulmonary disease. Electronically Signed   By: Maurine Simmering M.D.   On: 08/02/2021 14:33   CT HEAD CODE STROKE WO CONTRAST  Result Date: 08/02/2021 CLINICAL DATA:  Code stroke. Neuro deficit, acute, stroke suspected left sided weakness, slurred speech EXAM: CT HEAD WITHOUT CONTRAST TECHNIQUE: Contiguous axial images were obtained from the base of the skull through the vertex without intravenous contrast. COMPARISON:  February 2022 FINDINGS: Brain: There is no acute intracranial hemorrhage, mass effect, or edema. No new loss of gray-white differentiation. Ventricles and sulci are stable in  size and configuration. Patchy hypoattenuation in the supratentorial white matter is nonspecific but probably reflects stable chronic microvascular ischemic changes. No extra-axial collection. Vascular: No hyperdense vessel. There is intracranial atherosclerotic calcification at the skull base. Skull: Unremarkable. Sinuses/Orbits: No acute abnormality. Other: Left frontal scalp hematoma. ASPECTS ([place] Stroke Program  Early CT Score) - Ganglionic level infarction (caudate, lentiform nuclei, internal capsule, insula, M1-M3 cortex): 7 - Supraganglionic infarction (M4-M6 cortex): 3 Total score (0-10 with 10 being normal): 10 IMPRESSION: There is no acute intracranial hemorrhage or evidence of acute infarction. ASPECT score is 10. These results were communicated to Dr. [name] at 2:11 pm on 08/02/2021 by text page via the [facility] messaging system. Electronically Signed   By: Macy Mis M.D.   On: 08/02/2021 14:14    EKG: Independently reviewed.  Sinus rhythm, T wave inversion and submillimeter ST depressions in leads I and aVL similar but more pronounced compared to prior.  Some J-point elevation most notable in lead III.  Remainder of EKG without signs of ischemic changes and similar to prior.  Assessment/Plan Principal Problem:   AMS (altered mental status) Active Problems:   NSTEMI (non-ST elevated myocardial infarction) (HCC)   (HFpEF) heart failure with preserved ejection fraction (HCC)   Non-traumatic rhabdomyolysis   Coronary artery disease involving native coronary artery of native heart without angina pectoris   Depression   Seizures ([place])   Hypertension   Anemia   Falls frequently   Invasive ductal carcinoma of breast, female, left ([place])   Stage 3b chronic kidney disease (HCC)   Anne Phillips is a 70 y.o. female with history of failure with preserved ejection fraction, CAD, NSTEMI, seizure disorder, breast cancer, CKD, depression, hypertension, frequent falls, who presents after a fall with altered mental status.  #Altered mental status Does appear somewhat confused on exam but no clear etiology.  Imaging studies negative for acute intracranial process.  Patient is endorsing use of the medication, on review of her list the most likely culprit would appear to be her Klonopin, however on review of PDMP this is a longstanding medication for which she gets regular refills.  UA somewhat suggestive  of infection, will hold off on antibiotics for the time being and wait for results of the urine culture.  Patient also has known seizure disorder, will send off levels while continuing home meds.  May also be dehydrated, will gently fluid resuscitate.  Appreciate neurology input, though differential leans heavily towards medication side effect as well, we discussed EEG but not recommended at this time. - Check Keppra and lacosamide levels, continue usual home doses per neurology - 500 cc LR bolus - Continue Klonopin at lower dose of 0.5 mg twice daily - Follow-up urine culture results - Follow-up neurology recommendations  #Fall #C6 endplate fracture See above for possible etiologies.  Patient was was placed in c-collar and will need to remain in this until she is able to be evaluated as an outpatient by neurosurgery or orthospine.  Given frequent falls we will also assess ADLs. - PT and OT consults  #Elevated troponin EKG does show some more pronounced LVH changes but overall no STEMI.  Suspect mildly elevated troponin is due to combination of mild dehydration, CKD, and underlying disease.  Trend troponin.  #Thyroid nodule Incidentally noted on MRI of the cervical spine, thyroid ultrasound ordered for follow-up.  #Chronic medical problems DM2-continue empagliflozin, check CBGs 4 times daily with sensitive sliding scale insulin correction  Anxiety-reduce home Klonopin dose from 1 mg  to 0.5 mg twice daily  Neuropathy-continue gabapentin 300 mg twice daily, consider discontinuation given frequent falls  Asthma-continue albuterol as needed  CHF/hypertension-continue amlodipine, carvedilol, furosemide, losartan, spironolactone  Code Status: Full Code, presumed DVT Prophylaxis: SCDs Family Communication: none Disposition Plan: Inpatient, Med-Surg   Time spent: 68 min  Clarnce Flock MD/[facility]  Note:  This document was prepared using TEFL teacher and may include unintentional dictation errors.

## 2021-08-02 NOTE — ED Provider Notes (Signed)
[facility] Emergency Department Provider Note  ____________________________________________   Event Date/Time   First MD Initiated Contact with Patient 08/02/21 1359     (approximate)  I have reviewed the triage vital signs and the nursing notes.   HISTORY  Chief Complaint Fall and Altered Mental Status   HPI Anne Phillips is a 70 y.o. female with past medical history of CAD s/p PCI to RCA, HTN, HDL, seizure disorder, and CHF who presents for assessment after a fall today.  This apparently occurred after around 11 AM today.  Patient reported some increased weakness starting last night but then reported significantly increased weakness more in the left after the fall.  She is somewhat poor historian and confused on my initial evaluation.  She is endorsing some mild chest discomfort but denying any abdominal pain, back pain, neck pain or extremity pain.  She is endorsing headache.  No other clear associated sick symptoms on arrival.  Further history is limited secondary to patient's confusion on arrival.         Past Medical History:  Diagnosis Date   Cancer [facility])    Coronary artery disease    Hyperlipidemia    Hypertension    Seizures ([place])    Thyroid disease     Patient Active Problem List   Diagnosis Date Noted   Carbon monoxide poisoning 10/03/2020   Fall at home, initial encounter 10/03/2020   Seizures ([place])    Hypertension    Depression 08/27/2015   Acute delirium 08/27/2015   Depression, major, single episode, moderate (HCC)    (HFpEF) heart failure with preserved ejection fraction [facility])    Disorientation    Pyrexia    Hypoxia    Non-traumatic rhabdomyolysis    Coronary artery disease involving native coronary artery of native heart without angina pectoris    Sepsis ([place]) 08/20/2015   NSTEMI (non-ST elevated myocardial infarction) ([place]) 08/20/2015   Rhabdomyolysis 08/20/2015    Past Surgical History:  Procedure Laterality Date    MASTECTOMY      Prior to Admission medications   Medication Sig Start Date End Date Taking? Authorizing Provider  acetaminophen (TYLENOL) 325 MG tablet Take 2 tablets (650 mg total) by mouth every 6 (six) hours as needed for mild pain (or Fever >/= 101). 10/05/20   Domenic Polite, MD  amLODipine (NORVASC) 10 MG tablet Take 10 mg by mouth daily.    [provider]  atorvastatin (LIPITOR) 40 MG tablet Take 40 mg by mouth daily.    [provider]  carvedilol (COREG) 12.5 MG tablet Take 12.5 mg by mouth 2 (two) times daily with a meal.    [provider]  empagliflozin (JARDIANCE) 10 MG TABS tablet Take 10 mg by mouth daily.    [provider]  furosemide (LASIX) 40 MG tablet Take 40 mg by mouth daily.    [provider]  gabapentin (NEURONTIN) 300 MG capsule Take 1 capsule (300 mg total) by mouth 2 (two) times daily. 10/05/20   Domenic Polite, MD  Lacosamide (VIMPAT) 150 MG TABS Take 150 mg by mouth in the morning and at bedtime.    [provider]  levETIRAcetam (KEPPRA) 500 MG tablet Take 750 mg by mouth 3 (three) times daily. Take 1 and 1/2 tablets three times daily    [provider]  losartan (COZAAR) 100 MG tablet Take 100 mg by mouth daily.    [provider]  spironolactone (ALDACTONE) 25 MG tablet Take 12.5  mg by mouth daily.    [provider]    Allergies Iodinated diagnostic agents  Family History  Problem Relation Age of Onset   Hypertension Other     Social History Social History   Tobacco Use   Smoking status: Every Day    Packs/day: 1.00    Types: Cigarettes   Smokeless tobacco: Never  Vaping Use   Vaping Use: Never used  Substance Use Topics   Alcohol use: No   Drug use: Never    Review of Systems  Review of Systems  Unable to perform ROS: Mental status change  Cardiovascular:  Positive for chest pain.  Neurological:  Positive for dizziness, tremors and weakness.      ____________________________________________   PHYSICAL EXAM:  VITAL SIGNS: ED Triage Vitals  Enc Vitals Group     BP 08/02/21 1332 (!) 145/84     Pulse Rate 08/02/21 1332 87     Resp 08/02/21 1332 16     Temp 08/02/21 1332 98 F (36.7 C)     Temp Source 08/02/21 1332 Oral     SpO2 08/02/21 1332 98 %     Weight 08/02/21 1336 130 lb (59 kg)     Height 08/02/21 1336 5\' 2"  (1.575 m)     Head Circumference --      Peak Flow --      Pain Score 08/02/21 1336 0     Pain Loc --      Pain Edu? --      Excl. in [place]? --    Vitals:   08/02/21 1426 08/02/21 1430  BP: (!) 147/90 (!) 142/78  Pulse: 90   Resp: 18   Temp:    SpO2: 100%    Physical Exam Vitals and nursing note reviewed.  Constitutional:      General: She is not in acute distress.    Appearance: She is well-developed.  HENT:     Head: Normocephalic.     Right Ear: External ear normal.     Left Ear: External ear normal.     Nose: Nose normal.  Eyes:     Conjunctiva/sclera: Conjunctivae normal.  Cardiovascular:     Rate and Rhythm: Normal rate and regular rhythm.     Heart sounds: No murmur heard. Pulmonary:     Effort: Pulmonary effort is normal. No respiratory distress.     Breath sounds: Normal breath sounds.  Abdominal:     Palpations: Abdomen is soft.     Tenderness: There is no abdominal tenderness.  Musculoskeletal:        General: No swelling.     Cervical back: Neck supple.  Skin:    General: Skin is warm and dry.     Capillary Refill: Capillary refill takes less than 2 seconds.  Neurological:     Mental Status: She is alert. She is disoriented.  Psychiatric:        Mood and Affect: Mood normal.    Hematoma to forehead.  Patient has gross ataxia all extremities and some tremor.  She also has nystagmus in her eyes.  PERRLA.  EOMI.  Cranial 2-12 are otherwise grossly unremarkable.  There is no tenderness over the supra/T/L-spine.  Patient does seem weaker in the left upper extremity compared  to the right but is able to grip this examiner.  Sensation is intact to light touch all extremities. ____________________________________________   LABS (all labs ordered are listed, but only abnormal results are displayed)  Labs Reviewed  CBG MONITORING, ED - Abnormal; Notable for the following components:      Result Value   Glucose-Capillary 104 (*)    All other components within normal limits  RESP PANEL BY RT-PCR (FLU A&B, COVID) ARPGX2  ETHANOL  PROTIME-INR  APTT  CBC  DIFFERENTIAL  COMPREHENSIVE METABOLIC PANEL  URINE DRUG SCREEN, QUALITATIVE ([facility] ONLY)  URINALYSIS, COMPLETE (UACMP) WITH MICROSCOPIC  TROPONIN I (HIGH SENSITIVITY)   ____________________________________________  EKG  ECG remarkable for sinus rhythm with first-degree AV block with PR interval of 286 and otherwise unremarkable intervals with fairly diffuse nonspecific ST changes in inferior and lateral leads as well as nonspecific change in V3 without other clearance of acute ischemia.  Changes appear largely similar to that obtained on 04/11/2021. ____________________________________________  RADIOLOGY  ED MD interpretation:    CT head shows no evidence of skull fracture, intracranial hemorrhage, ischemia, mass-effect or other clear acute intracranial process.  Hematomas noted at the forehead.  CT C-spine shows possible small anterior inferior endplate fracture at C6 without comparison.  Chest x-ray shows no focal consolidation, effusion, edema, pneumothorax or other clear acute thoracic process.  Official radiology report(s): CT Cervical Spine Wo Contrast  Result Date: 08/02/2021 CLINICAL DATA:  Neck trauma (Age >= 65y) EXAM: CT CERVICAL SPINE WITHOUT CONTRAST TECHNIQUE: Multidetector CT imaging of the cervical spine was performed without intravenous contrast. Multiplanar CT image reconstructions were also generated. COMPARISON:  None. FINDINGS: Alignment: Straightening of the normal cervical lordosis  likely due to patient positioning. Skull base and vertebrae: There is a possible small anterior inferior endplate fracture of C6, with a non corticated margin. This was identified as well on [place]. Soft tissues and spinal canal: No prevertebral fluid or swelling. No visible canal hematoma. Disc levels: There is multilevel degenerative disc disease, moderate at C5-C6 and C6-C7. Posterior disc osteophyte complexes at these levels. Upper chest: Negative Other: There are multiple hypodense thyroid nodules bilaterally, largest measuring up to 3.7 cm in the right thyroid lobe. IMPRESSION: Possible small anterior inferior endplate fracture of C6. This could represent sequela of degenerative osteophyte formation but there appears to be a non-corticated margin. No priors for comparison. Consider MRI for further evaluation. Electronically Signed   By: Maurine Simmering M.D.   On: 08/02/2021 14:26   DG Chest Portable 1 View  Result Date: 08/02/2021 CLINICAL DATA:  Fall, altered mental status EXAM: PORTABLE CHEST 1 VIEW COMPARISON:  04/10/2021 FINDINGS: Unchanged cardiomediastinal silhouette. There is no new airspace disease. There is no large pleural effusion. No visible pneumothorax. Unchanged upper right mediastinal prominence due to enlarged thyroid, with leftward tracheal deviation. Left axillary surgical clips. There is no acute osseous abnormality. IMPRESSION: No evidence of acute cardiopulmonary disease. Electronically Signed   By: Maurine Simmering M.D.   On: 08/02/2021 14:33   CT HEAD CODE STROKE WO CONTRAST  Result Date: 08/02/2021 CLINICAL DATA:  Code stroke. Neuro deficit, acute, stroke suspected left sided weakness, slurred speech EXAM: CT HEAD WITHOUT CONTRAST TECHNIQUE: Contiguous axial images were obtained from the base of the skull through the vertex without intravenous contrast. COMPARISON:  February 2022 FINDINGS: Brain: There is no acute intracranial hemorrhage, mass effect, or edema. No new loss of  gray-white differentiation. Ventricles and sulci are stable in size and configuration. Patchy hypoattenuation in the supratentorial white matter is nonspecific but probably reflects stable chronic microvascular ischemic changes. No extra-axial collection. Vascular: No hyperdense vessel. There is intracranial atherosclerotic calcification at the skull base. Skull: Unremarkable. Sinuses/Orbits: No acute abnormality.  Other: Left frontal scalp hematoma. ASPECTS ([place] Stroke Program Early CT Score) - Ganglionic level infarction (caudate, lentiform nuclei, internal capsule, insula, M1-M3 cortex): 7 - Supraganglionic infarction (M4-M6 cortex): 3 Total score (0-10 with 10 being normal): 10 IMPRESSION: There is no acute intracranial hemorrhage or evidence of acute infarction. ASPECT score is 10. These results were communicated to Dr. [name] at 2:11 pm on 08/02/2021 by text page via the [facility] messaging system. Electronically Signed   By: Macy Mis M.D.   On: 08/02/2021 14:14    ____________________________________________   PROCEDURES  Procedure(s) performed (including Critical Care):  .1-3 Lead EKG Interpretation Performed by: Lucrezia Starch, MD Authorized by: Lucrezia Starch, MD     Interpretation: non-specific     ECG rate assessment: normal     Rhythm: sinus rhythm     Ectopy: none     Conduction: abnormal     Abnormal conduction: 1st degree AV block     ____________________________________________   INITIAL IMPRESSION / ASSESSMENT AND PLAN / ED COURSE      Patient presents with above-stated history exam for assessment of current fall that occurred earlier today in the setting of some left arm weakness.  Patient is a poor historian is not exactly clear the chronicity of the left arm weakness.  She does have evidence of hematoma on her forehead, is fairly taxing on extremities and does diagnosed horizontally of the eyes as well.  She does not seem to have significant tenderness  over the C/T/L-spine or other significant trauma to the extremities or torso.  Patient was made code stroke in triage with concern for possible acute CVA.  Additionally ordered a CT C-spine, chest x-ray given patient is a poor historian, fell.   Presents complaining of chest discomfort although is unable to further characterize this more extensively as well.  Differential considerations include CVA, seizure, arrhythmia, metabolic derangements, ACS with possible subsequent traumatic injuries following fall.  CT head shows no evidence of skull fracture, intracranial hemorrhage, ischemia, mass-effect or other clear acute intracranial process.  Hematomas noted at the forehead.  CT C-spine shows possible small anterior inferior endplate fracture at C6 without comparison.  Chest x-ray shows no focal consolidation, effusion, edema, pneumothorax or other clear acute thoracic process.  EKG obtained shows multiple concerning findings of these appear largely similar to prior EKG.  I will plan to obtain a troponin as well.  Patient seen following CT imaging by neurology at bedside who recommends patient's MRA brain as well as an MRI brain.  Given nonspecific findings on CT C-spine I will obtain MR C-spine as well.  History of encephalitis or  Patient signed over to assuming father approximately 1500.  Plan is to follow-up MRI imaging and labs and reassess.  Plan is also to follow-up with neurology pending MRI results.     ____________________________________________   FINAL CLINICAL IMPRESSION(S) / ED DIAGNOSES  Final diagnoses:  Traumatic hematoma of forehead, initial encounter  Fall, initial encounter  Weakness    Medications - No data to display   ED Discharge Orders     None        Note:  This document was prepared using Dragon voice recognition software and may include unintentional dictation errors.    Lucrezia Starch, MD 08/02/21 [phone]

## 2021-08-02 NOTE — ED Notes (Signed)
This RN to bedside. Pt stating that she needs to get up and use the restroom. Pt was offered bed pan and pure wick and she refused both. Pt states that she wants to try to get up and walk to the restroom. Pt was informed that I do not feel comfortable trying to get her up to the bathroom because she was not able to stand well on her own and she has already fallen and hit her head. Pt states that she fell earlier this morning and that she may be able to walk better now. Pt was informed that I was not willing to risk her falling again and getting hurt and that she would need to use either the pure wick or the bed pan. Pt refused both stating that she wants to get up and go to the restroom.   This RN spoke with primary RN, Elana, who is also felt uncomfortable getting pt up to the restroom due to how unsteady she is on her feet. Will try to offer bed pan or pure wick again.

## 2021-08-02 NOTE — ED Notes (Signed)
Nira Conn, RN, stroke coordinator escorted pt. To MRI. Cont. Cardiac monitoring in place.

## 2021-08-02 NOTE — ED Provider Notes (Signed)
Emergency Medicine Provider Triage Evaluation Note  Anne Phillips , a 70 y.o. female  was evaluated in triage.  Pt complains of fall.  EMS dropped the patient off without providing report.  Presents from home after a fall.  Has large obvious frontal hematoma on the left.  Slurred speech and left-sided weakness is present.  Code stroke called..  Review of Systems  Positive: Dizziness, fall, possible syncope Negative:   Physical Exam  BP (!) 145/84 (BP Location: Right Arm)   Pulse 87   Temp 98 F (36.7 C) (Oral)   Resp 16   Ht 5\' 2"  (1.575 m)   Wt 59 kg   SpO2 98%   BMI 23.78 kg/m  Gen:   Awake, no distress   Resp:  Normal effort  MSK:   Moves extremities without difficulty  Other:  Facial asymmetry, left-sided weakness and dysarthria are present.  Medical Decision Making  Medically screening exam initiated at 1:56 PM.  Appropriate orders placed.  [name] was informed that the remainder of the evaluation will be completed by another provider, this initial triage assessment does not replace that evaluation, and the importance of remaining in the ED until their evaluation is complete.  Code stroke called   Vladimir Crofts, MD 08/02/21 1357

## 2021-08-02 NOTE — ED Notes (Signed)
This RN to bedside for assessment. Pt. Apprears to be much more awake and alert, speech is a bit clearer, although still slurred/garbled. Limb ataxia is less than previous.

## 2021-08-02 NOTE — ED Notes (Signed)
Per order, c-collar placed on pt.

## 2021-08-03 ENCOUNTER — Inpatient Hospital Stay: Payer: Medicare HMO

## 2021-08-03 ENCOUNTER — Encounter: Payer: Self-pay | Admitting: Family Medicine

## 2021-08-03 DIAGNOSIS — R7989 Other specified abnormal findings of blood chemistry: Secondary | ICD-10-CM

## 2021-08-03 DIAGNOSIS — R778 Other specified abnormalities of plasma proteins: Secondary | ICD-10-CM

## 2021-08-03 DIAGNOSIS — R410 Disorientation, unspecified: Secondary | ICD-10-CM

## 2021-08-03 DIAGNOSIS — E871 Hypo-osmolality and hyponatremia: Secondary | ICD-10-CM

## 2021-08-03 LAB — CBC
HCT: 30.7 % — ABNORMAL LOW (ref 36.0–46.0)
Hemoglobin: 10.2 g/dL — ABNORMAL LOW (ref 12.0–15.0)
MCH: 28.9 pg (ref 26.0–34.0)
MCHC: 33.2 g/dL (ref 30.0–36.0)
MCV: 87 fL (ref 80.0–100.0)
Platelets: 167 10*3/uL (ref 150–400)
RBC: 3.53 MIL/uL — ABNORMAL LOW (ref 3.87–5.11)
RDW: 14.2 % (ref 11.5–15.5)
WBC: 6.4 10*3/uL (ref 4.0–10.5)
nRBC: 0 % (ref 0.0–0.2)

## 2021-08-03 LAB — BASIC METABOLIC PANEL
Anion gap: 12 (ref 5–15)
BUN: 34 mg/dL — ABNORMAL HIGH (ref 8–23)
CO2: 22 mmol/L (ref 22–32)
Calcium: 9.1 mg/dL (ref 8.9–10.3)
Chloride: 99 mmol/L (ref 98–111)
Creatinine, Ser: 1.55 mg/dL — ABNORMAL HIGH (ref 0.44–1.00)
GFR, Estimated: 36 mL/min — ABNORMAL LOW (ref >60)
Glucose, Bld: 96 mg/dL (ref 70–99)
Potassium: 3.7 mmol/L (ref 3.5–5.1)
Sodium: 133 mmol/L — ABNORMAL LOW (ref 135–145)

## 2021-08-03 LAB — GLUCOSE, CAPILLARY
Glucose-Capillary: 114 mg/dL — ABNORMAL HIGH (ref 70–99)
Glucose-Capillary: 115 mg/dL — ABNORMAL HIGH (ref 70–99)
Glucose-Capillary: 107 mg/dL — ABNORMAL HIGH (ref 70–99)
Glucose-Capillary: 130 mg/dL — ABNORMAL HIGH (ref 70–99)

## 2021-08-03 LAB — VITAMIN B12: Vitamin B-12: 172 pg/mL — ABNORMAL LOW (ref 180–914)

## 2021-08-03 LAB — TSH: TSH: 0.521 u[IU]/mL (ref 0.350–4.500)

## 2021-08-03 IMAGING — US US THYROID
1 series · 13 of 25 positions shown · non-contrast
Comparison: None.

CLINICAL DATA: Goiter.

EXAM:
THYROID ULTRASOUND
TECHNIQUE: Ultrasound examination of the thyroid gland and adjacent soft
tissues was performed.

[Series 1: us thyroid · 134 acquisitions, 13 frames shown]
[im 1/134]
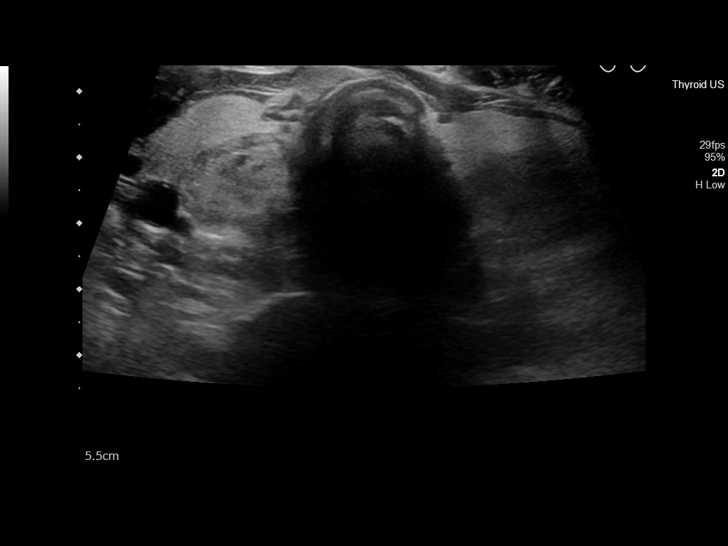
[im 12/134]
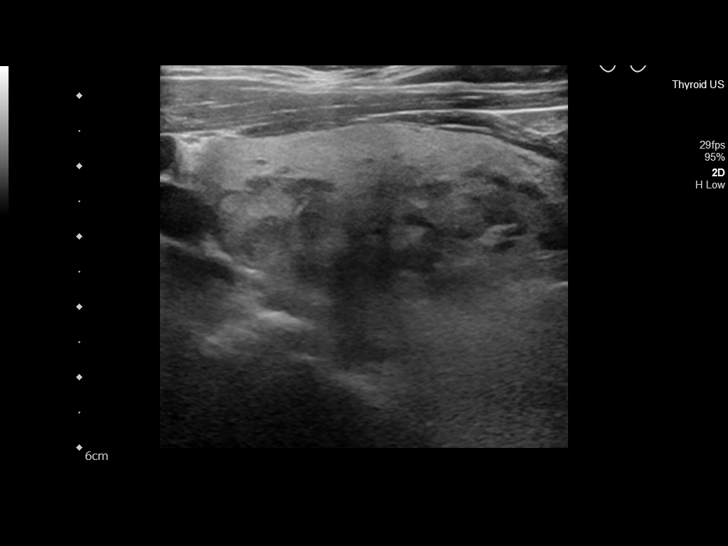
[im 23/134]
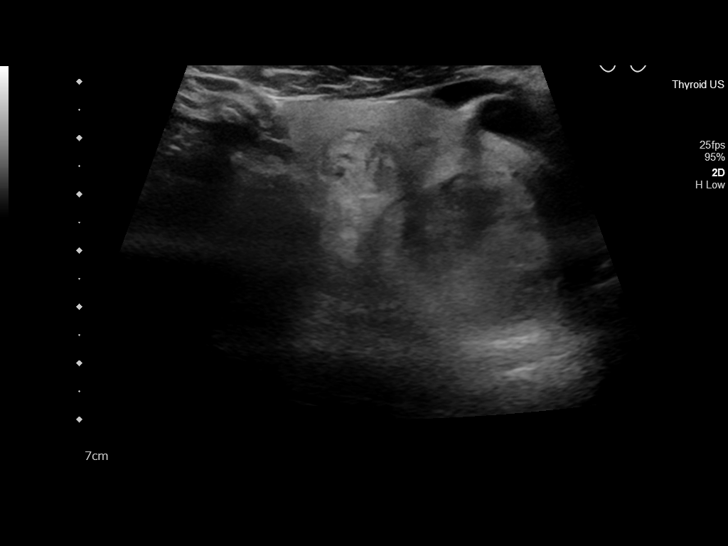
[im 34/134]
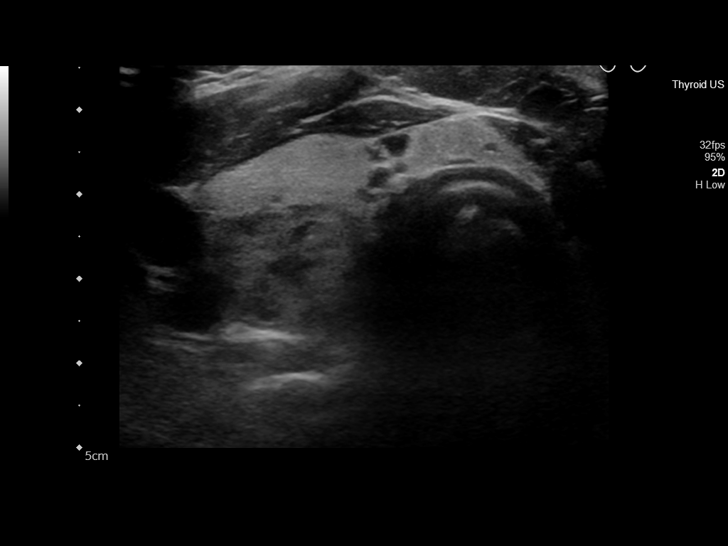
[im 45/134]
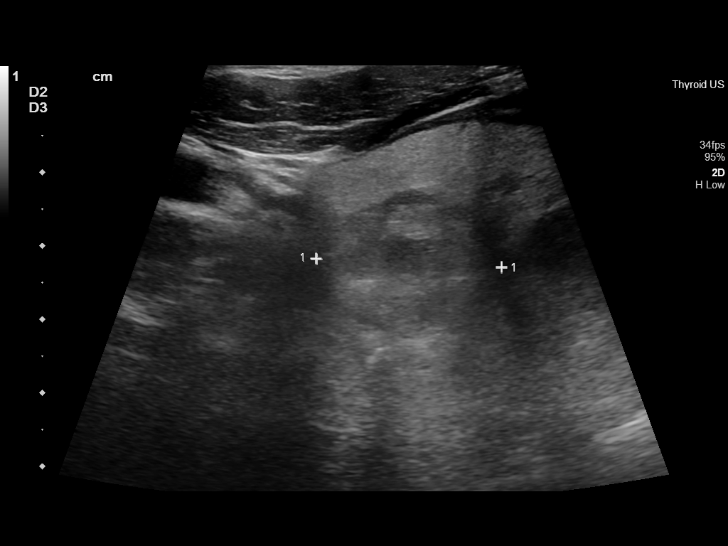
[im 56/134]
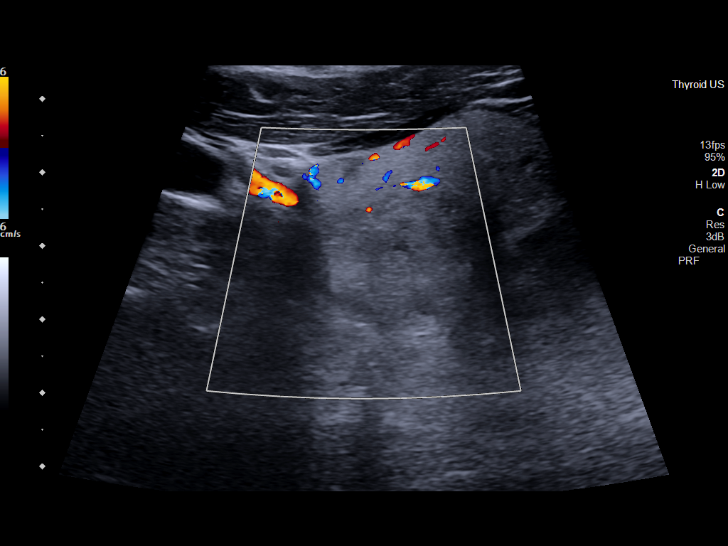
[im 67/134]
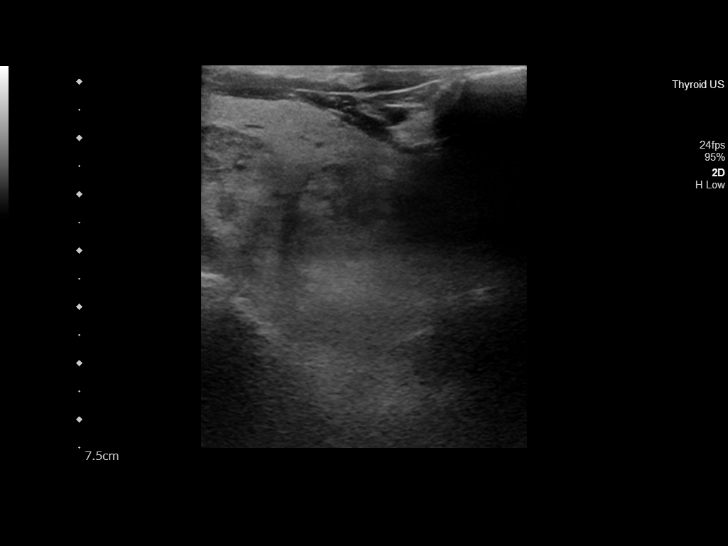
[im 78/134]
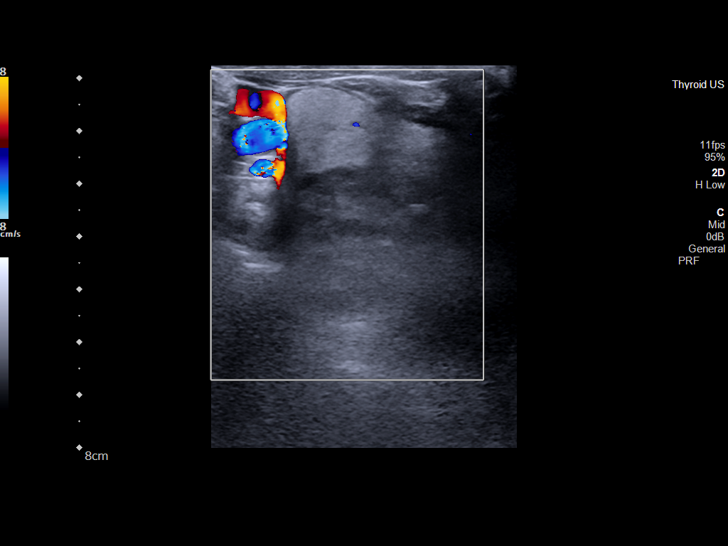
[im 89/134]
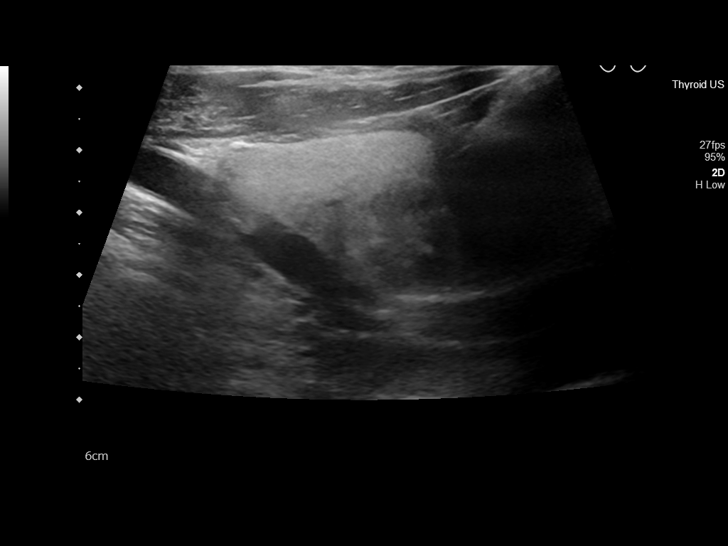
[im 100/134]
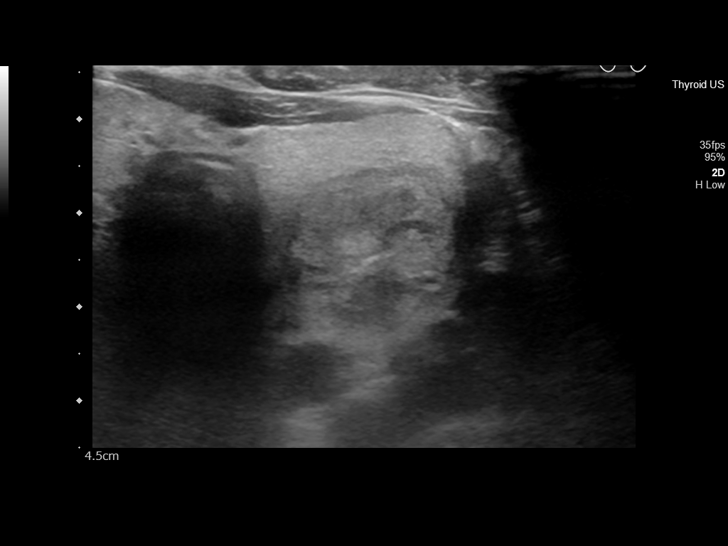
[im 111/134]
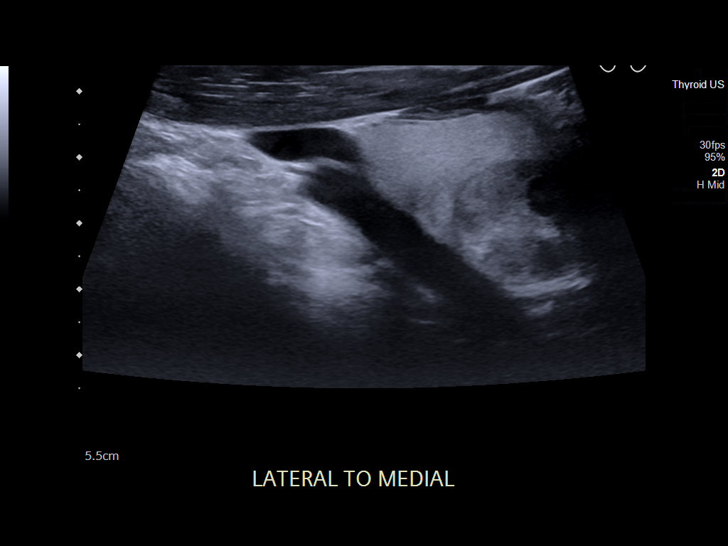
[im 122/134]
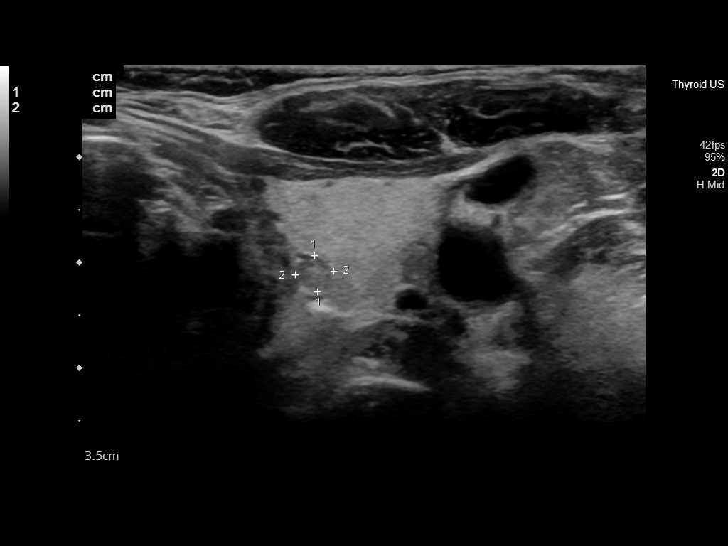
[im 134/134]
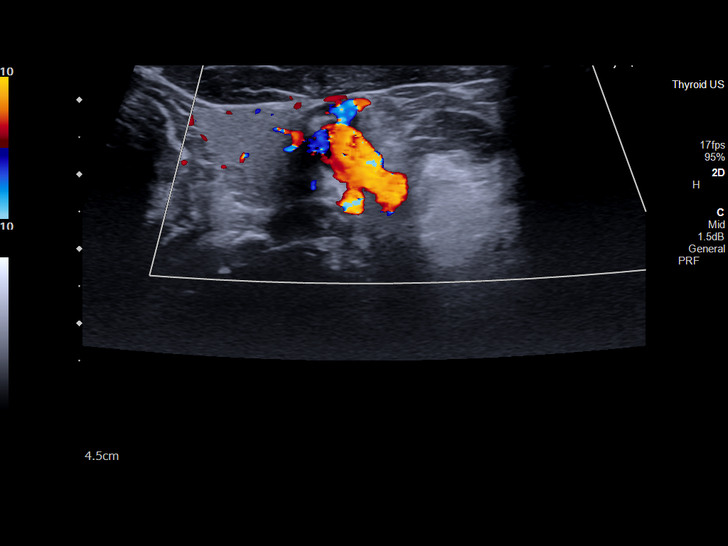

[13 of 25 positions shown; findings below may reference images not displayed]

FINDINGS: Parenchymal Echotexture: Mildly heterogenous

Isthmus: 0.4 cm

Right lobe: 5.1 x 2.7 x 2.7 cm

Left lobe: 4.9 x 2.4 x 2.2 cm

_________________________________________________________

Estimated total number of nodules >/= 1 cm: 3

Number of spongiform nodules >/=  2 cm not described below (TR1): 0

Number of mixed cystic and solid nodules >/= 1.5 cm not described
below (TR2): 0

_________________________________________________________

Nodule # 1:

Location: Right; Mid

Maximum size: 2.5 cm; Other 2 dimensions: 1.9 x 1.8 cm

Composition: solid/almost completely solid (2)

Echogenicity: hypoechoic (2)

Shape: not taller-than-wide (0)

Margins: ill-defined (0)

Echogenic foci: none (0)

ACR TI-RADS total points: 4.

ACR TI-RADS risk category: TR4 (4-6 points).

ACR TI-RADS recommendations:

**Given size (>/= 1.5 cm) and appearance, fine needle aspiration of
this moderately suspicious nodule should be considered based on
TI-RADS criteria.

_________________________________________________________

Nodule # 2:

Location: Right; Inferior

Maximum size: 3.5 cm; Other 2 dimensions: 3.5 x 3.3 cm

Composition: solid/almost completely solid (2)

Echogenicity: hypoechoic (2)

Shape: not taller-than-wide (0)

Margins: ill-defined (0)

Echogenic foci: none (0)

ACR TI-RADS total points: 4.

ACR TI-RADS risk category: TR4 (4-6 points).

ACR TI-RADS recommendations:

**Given size (>/= 1.5 cm) and appearance, fine needle aspiration of
this moderately suspicious nodule should be considered based on
TI-RADS criteria.

_________________________________________________________

Nodule # 3:

Location: Left; Inferior

Maximum size: 2.4 cm; Other 2 dimensions: 2.1 x 2.0 cm

Composition: solid/almost completely solid (2)

Echogenicity: isoechoic (1)

Shape: not taller-than-wide (0)

Margins: smooth (0)

Echogenic foci: none (0)

ACR TI-RADS total points: 3.

ACR TI-RADS risk category: TR3 (3 points).

ACR TI-RADS recommendations:

*Given size (>/= 1.5 - 2.4 cm) and appearance, a follow-up
ultrasound in 1 year should be considered based on TI-RADS criteria.

_________________________________________________________
IMPRESSION: 1. Heterogeneous and multi lobular thyroid gland most consistent
with multinodular goiter.
2. TI-RADS category 4 nodules in the right mid and right inferior
gland (labeled # 1 and # 2 above) meet criteria to consider
fine-needle aspiration biopsy.
3. TI-RADS category 3 nodule in the left inferior gland (# 4) meets
criteria for imaging surveillance. Recommend follow-up ultrasound in
1 year.

The above is in keeping with the ACR TI-RADS recommendations - [HOSPITAL] [UC];[DATE].

## 2021-08-03 MED ORDER — CLONAZEPAM 0.25 MG PO TBDP
0.2500 mg | ORAL_TABLET | Freq: Two times a day (BID) | ORAL | Status: DC
  Administered 2021-08-03 – 2021-08-06 (×6): 0.25 mg via ORAL
  Filled 2021-08-03 (×6): qty 1

## 2021-08-03 MED ORDER — CARVEDILOL 6.25 MG PO TABS
6.2500 mg | ORAL_TABLET | Freq: Two times a day (BID) | ORAL | Status: DC
  Administered 2021-08-05 – 2021-08-06 (×3): 6.25 mg via ORAL
  Filled 2021-08-03 (×4): qty 1

## 2021-08-03 MED ORDER — SODIUM CHLORIDE 0.9 % IV BOLUS
500.0000 mL | Freq: Once | INTRAVENOUS | Status: AC
  Administered 2021-08-03: 500 mL via INTRAVENOUS

## 2021-08-03 MED ORDER — SODIUM CHLORIDE 0.9 % IV SOLN
INTRAVENOUS | Status: DC
Start: 2021-08-03 — End: 2021-08-05

## 2021-08-03 MED ORDER — HYDROCORTISONE SOD SUC (PF) 100 MG IJ SOLR
100.0000 mg | Freq: Once | INTRAMUSCULAR | Status: AC
  Administered 2021-08-03: 14:00:00 100 mg via INTRAVENOUS
  Filled 2021-08-03: qty 2

## 2021-08-03 MED ORDER — SODIUM CHLORIDE 0.9 % IV SOLN
1.0000 g | INTRAVENOUS | Status: DC
  Administered 2021-08-03 – 2021-08-04 (×2): 1 g via INTRAVENOUS
  Filled 2021-08-03 (×3): qty 10

## 2021-08-03 MED ORDER — SODIUM CHLORIDE 0.9 % IV BOLUS
500.0000 mL | Freq: Once | INTRAVENOUS | Status: AC
  Administered 2021-08-03: 14:00:00 500 mL via INTRAVENOUS

## 2021-08-03 NOTE — Evaluation (Signed)
Physical Therapy Evaluation Patient Details Name: Anne Phillips MRN: [id number] DOB: [birth year] Today's Date: 08/03/2021  History of Present Illness  Pt admitted to [facility] on 08/02/21 or c/o fall and hitting head, endorsing dizziness as she went to stand. Code stroke called due to dysarthria and L sided weakness. Significant PMH includes: CAD s/p PCI to RCA, HTN, HDL, HFpEF, NSTEMI, breast CA, depression, seizure disorder, and CHF. Imaging revealed small naterior inferior endplate fx at C6 but negative for acute infarct. Pt placed in C-collar and will need to remain in this until she's able to be evaluated by OP neurosurgery or orthospine. Suspect mildly elevated troponin is due to combination of mild dehydration, CKD, and underlying disease. Per neurology consult, pt symptoms believed to be diffuse ataxia secondary to medication side effects.   Clinical Impression  Pt is a 70 year old F admitted to hospital on 08/02/21 for mechanical fall and AMS; found to have C6 fx. At baseline, pt is Ind with ADL's, ambulation without AD, and light IADL's; older sister assists with IADL's and transportation. Pt presents with poor safety awareness, impaired processing, functional weakness, mild ataxia, decreased standing balance, increased pain levels, impaired skin integrity with significant ecchymosis/edema of L eye and forehead, and decreased activity tolerance resulting in impaired functional mobility from baseline. Due to deficits, pt required mod I for bed mobility, CGA for transfers, and CGA-min assist for limited gait with RW. Despite being A&O x4, she presents with impaired processing and poor safety awareness, and required increased cueing throughout session for safety, sequencing, and hand placement. Pt with x2 LOB during gait requiring increased assist from PT for correction and maintenance of balance, indicating increased risk of falls. Educated regarding hard C-collar and cervical precautions for pain  management. Deficits limit the pt's ability to safely and independently perform ADL's, transfer, and ambulate. Pt will benefit from acute skilled PT services to address deficits for return to baseline function. Pt with hx of multiple falls and has limited support at home; therefore, will recommend SNF at DC to address deficits and improve overall safety with functional mobility prior to return home. Pt seems agreeable.     Of note, PT to assist with adjusting hard C-collar while EOB. However, C-collar appears to be too big for pt as she is unable to keep chin on brace and appears to be unable to eat with C-collar. Therefore, she will benefit from smaller size. Care team including RN and MD notified.     Recommendations for follow up therapy are one component of a multi-disciplinary discharge planning process, led by the attending physician.  Recommendations may be updated based on patient status, additional functional criteria and insurance authorization.  Follow Up Recommendations Skilled nursing-short term rehab (<3 hours/day)    Assistance Recommended at Discharge Intermittent Supervision/Assistance  Functional Status Assessment Patient has had a recent decline in their functional status and demonstrates the ability to make significant improvements in function in a reasonable and predictable amount of time.  Equipment Recommendations   (defer to post acute)       Precautions / Restrictions Precautions Precautions: Fall;Cervical Precaution Booklet Issued: No Precaution Comments: hard c-collar; no aggressive cervical ROM, no push/pull/lift for pain management Restrictions Weight Bearing Restrictions: No      Mobility  Bed Mobility Overal bed mobility: Modified Independent             General bed mobility comments: to sit EOB with HOB elevated and use of BUE for support  Transfers Overall transfer level: Needs assistance Equipment used: Rolling walker (2 wheels) Transfers:  Sit to/from Stand Sit to Stand: Min guard;From elevated surface           General transfer comment: cues for safety and hand placement; initial LOB    Ambulation/Gait Ambulation/Gait assistance: Min guard;Min assist Gait Distance (Feet): 25 Feet Assistive device: Rolling walker (2 wheels)         General Gait Details: Grossly CGA for safety requiring intermittent min assist due to x2 LOB, especially with turns. Demonstrates decreased RW proximity/negotiation, decreased step length/foot clearance bil, poor safety awareness, and intermittent scissoring/ataxic gait. Verbal cues for RW proximity, negotiation, and safety.     Balance Overall balance assessment: Needs assistance Sitting-balance support: No upper extremity supported;Feet supported Sitting balance-Leahy Scale: Good Sitting balance - Comments: no LOB in sitting   Standing balance support: During functional activity;Bilateral upper extremity supported Standing balance-Leahy Scale: Poor Standing balance comment: x2 LOB requiring increased assist for maintenance of balance/safety                             Pertinent Vitals/Pain Pain Assessment: 0-10 Pain Score: 3  Pain Location: head Pain Intervention(s): Monitored during session;Repositioned    Home Living Family/patient expects to be discharged to:: Private residence Living Arrangements: Other relatives (18yo sister) Available Help at Discharge: Family (older sister, [name], able to provide 24/7 supervision; likely unable to provide physical assist as pt states "she'll do her best") Type of Home: House (renting) Home Access: Stairs to enter Entrance Stairs-Rails: Right Entrance Stairs-Number of Steps: 3-4   Home Layout: One level Home Equipment: Conservation officer, nature (2 wheels);Cane - single point;Tub bench      Prior Function Prior Level of Function : Needs assist             Mobility Comments: Reports being Ind with ambulation without AD;  intermittent use of RW/SPC when needed. "Very few" falls over the past 22mo; reports UE fx from previous fall ADLs Comments: Reports being Ind with ADL's and light IADL's; sister assist with meals/cooking and transportation.     Hand Dominance   Dominant Hand: Right    Extremity/Trunk Assessment   Upper Extremity Assessment Upper Extremity Assessment:  (not formally assessed due to C6 fx; ROM WNL, observed to be at least 3+/5 as pt was able to WB through UE for transfers/gait with RW; sensation intact; mild ataxia noted)    Lower Extremity Assessment Lower Extremity Assessment: Overall WFL for tasks assessed (Grossly 4/5; sensation and coordination intact; mild ataxia noted)    Cervical / Trunk Assessment Cervical / Trunk Assessment: Normal  Communication   Communication: No difficulties  Cognition Arousal/Alertness: Awake/alert Behavior During Therapy: WFL for tasks assessed/performed Overall Cognitive Status: Within Functional Limits for tasks assessed                                 General Comments: A&O x4, able to follow 100% of simple 2-step commands; increased time for processing of task        General Comments General comments (skin integrity, edema, etc.): significant edema and ecchymosis to L eye and L forehead; hard C-collar donned upon entry, assisted with adjusting however pt will need smaller C-collar    Exercises Other Exercises Other Exercises: Participated in bed mobility, transfers, and gait with RW. Mild ataxia with UE ROM and in standing during  gait; resulting in x2 LOB, able to correct with assist from PT. Verbal cues throughout for safety and sequencing with OOB mobility. Other Exercises: Pt educated re: PT role/POC, DC recommendations, hard C-collar, cervical precautions for pain management, safety with mobility. She verbalized understanding.   Assessment/Plan    PT Assessment Patient needs continued PT services  PT Problem List Decreased  strength;Decreased activity tolerance;Decreased balance;Decreased mobility;Decreased cognition;Decreased safety awareness;Pain;Decreased skin integrity       PT Treatment Interventions Gait training;DME instruction;Stair training;Functional mobility training;Therapeutic activities;Therapeutic exercise;Balance training;Neuromuscular re-education    PT Goals (Current goals can be found in the Care Plan section)  Acute Rehab PT Goals Patient Stated Goal: "to walk" PT Goal Formulation: With patient Time For Goal Achievement: 08/17/21 Potential to Achieve Goals: Good    Frequency Min 2X/week   Barriers to discharge Decreased caregiver support sister limited with physical assist at home       AM-PAC PT "6 Clicks" Mobility  Outcome Measure Help needed turning from your back to your side while in a flat bed without using bedrails?: None Help needed moving from lying on your back to sitting on the side of a flat bed without using bedrails?: None Help needed moving to and from a bed to a chair (including a wheelchair)?: A Little Help needed standing up from a chair using your arms (e.g., wheelchair or bedside chair)?: A Little Help needed to walk in hospital room?: A Little Help needed climbing 3-5 steps with a railing? : A Lot 6 Click Score: 19    End of Session Equipment Utilized During Treatment: Gait belt;Cervical collar Activity Tolerance: Patient tolerated treatment well;Patient limited by fatigue Patient left: in chair;with call bell/phone within reach;with chair alarm set;with nursing/sitter in room Nurse Communication: Mobility status (need for smaller C-collar) PT Visit Diagnosis: Unsteadiness on feet (R26.81);Repeated falls (R29.6);Muscle weakness (generalized) (M62.81);History of falling (Z91.81);Difficulty in walking, not elsewhere classified (R26.2);Pain Pain - Right/Left:  (cervical)    Time: 7544-9201 PT Time Calculation (min) (ACUTE ONLY): 27 min   Charges:   PT  Evaluation $PT Eval Low Complexity: 1 Low PT Treatments $Therapeutic Activity: 8-22 mins        Herminio Commons, PT, DPT 11:45 AM,08/03/21

## 2021-08-03 NOTE — Progress Notes (Addendum)
PROGRESS NOTE    Anne Phillips  UYQ:[id number] DOB: 1951-07-24 DOA: 08/02/2021 PCP: Pcp, No   Chief complaint.  Altered mental status. Brief Narrative:   Anne Phillips is a 70 y.o. female with history of failure with preserved ejection fraction, CAD, NSTEMI, seizure disorder, breast cancer, CKD, depression, hypertension, frequent falls, who presents after a fall with altered mental status. Cervical spine MRI showed neck concussion with soft tissue damage.  No fracture.  Brain MRI did not show any acute changes.  Neck MRI showed complex right thyroid nodule and a 3 cm.   Assessment & Plan:   Principal Problem:   AMS (altered mental status) Active Problems:   NSTEMI (non-ST elevated myocardial infarction) (HCC)   (HFpEF) heart failure with preserved ejection fraction (HCC)   Non-traumatic rhabdomyolysis   Coronary artery disease involving native coronary artery of native heart without angina pectoris   Depression   Seizures ([place])   Hypertension   Anemia   Falls frequently   Invasive ductal carcinoma of breast, female, left (HCC)   Stage 3b chronic kidney disease (HCC)  Altered mental status secondary to brain concussion. Ground-level fall. Neck contusion with Hyperflex injury Ataxia. Patient has altered mental status, she still confused.  This most likely secondary to brain concussion from the fall.  However, I cannot rule out UTI.  Patient has abnormal urine, urine cultures pending.  I will start Rocephin. I have discussed with Dr. Izora Ribas, there is no need for surgery for neck injury.  Patient will be on neck collar for 2 weeks until she is seen by neurosurgery in the office. To work-up for confusion, will also check TSH, B12 as well as ammonia level. Patient is also seen by neurology for ataxia, but this could be due to neck injury or brain concussion.  PT/OT has evaluated patient, deemed needing SNF placement.  Hyponatremia. Chronic kidney disease stage IIIb Patient  appears to have some dehydration.  I will discontinue Lasix, start gentle rehydration.  Renal function still stable  Chronic diastolic congestive heart failure  Elevated troponin. No evidence of exacerbation. Patient has mild elevation of troponin probably secondary to fall and injury.  No evidence of non-STEMI.  Thyroid nodule. Incidental finding on MRI, ultrasound is pending.  Patient need to follow-up as outpatient with surgery.  Type 2 diabetes  Continue sliding scale insulin.  History of seizure disorder. Patient did not have any witnessed seizure during this time.  Hypotension. Essential hypertension. Blood pressures are low, patient is receiving 1 L of fluid bolus.  Discontinue losartan and Norvasc.  Decrease Coreg to 6.25 mg twice a day.  Check cortisol level.  DVT prophylaxis: SCDs Code Status: full Family Communication:  Disposition Plan:    Status is: Inpatient  Remains inpatient appropriate because: Severity of disease, unsafe discharge.        I/O last 3 completed shifts: In: 240 [P.O.:240] Out: 50 [Urine:50] No intake/output data recorded.     Consultants:  Neurology  Procedures: None  Antimicrobials: None  Subjective: Patient still has significant confusion, which is intermittent.  No agitation. Still has neck pain.  Headache. Denies any short of breath or cough. No fever chills  No dysuria hematuria.  Objective: Vitals:   08/03/21 0028 08/03/21 0519 08/03/21 0926 08/03/21 1126  BP: 132/81 122/77 118/81 118/81  Pulse: 89 89 91 88  Resp: 16 16 16    Temp: 98.2 F (36.8 C)  (!) 97.4 F (36.3 C)   TempSrc: Oral  SpO2: 99% 100% 99% 99%  Weight:      Height:        Intake/Output Summary (Last 24 hours) at 08/03/2021 1202 Last data filed at 08/02/2021 2016 Gross per 24 hour  Intake 240 ml  Output 50 ml  Net 190 ml   Filed Weights   08/02/21 1336 08/02/21 2100  Weight: 59 kg 57.4 kg    Examination:  General exam: Appears  calm and comfortable  Respiratory system: Clear to auscultation. Respiratory effort normal. Cardiovascular system: S1 & S2 heard, RRR. No JVD, murmurs, rubs, gallops or clicks. No pedal edema. Gastrointestinal system: Abdomen is nondistended, soft and nontender. No organomegaly or masses felt. Normal bowel sounds heard. Central nervous system: Alert and oriented x2. No focal neurological deficits. Extremities: Symmetric 5 x 5 power. Skin: No rashes, lesions or ulcers Psychiatry: Mood & affect appropriate.     Data Reviewed: I have personally reviewed following labs and imaging studies  CBC: Recent Labs  Lab 08/02/21 1433 08/03/21 0616  WBC 7.4 6.4  NEUTROABS 6.4  --   HGB 11.3* 10.2*  HCT 34.7* 30.7*  MCV 89.9 87.0  PLT 174 035   Basic Metabolic Panel: Recent Labs  Lab 08/02/21 1433 08/03/21 0616  NA 131* 133*  K 3.5 3.7  CL 97* 99  CO2 23 22  GLUCOSE 98 96  BUN 29* 34*  CREATININE 1.52* 1.55*  CALCIUM 9.1 9.1   GFR: Estimated Creatinine Clearance: 27.1 mL/min (A) (by C-G formula based on SCr of 1.55 mg/dL (H)). Liver Function Tests: Recent Labs  Lab 08/02/21 1433  AST 19  ALT 9  ALKPHOS 130*  BILITOT 0.7  PROT 7.9  ALBUMIN 4.4   No results for input(s): LIPASE, AMYLASE in the last 168 hours. No results for input(s): AMMONIA in the last 168 hours. Coagulation Profile: Recent Labs  Lab 08/02/21 1433  INR 1.2   Cardiac Enzymes: No results for input(s): CKTOTAL, CKMB, CKMBINDEX, TROPONINI in the last 168 hours. BNP (last 3 results) No results for input(s): PROBNP in the last 8760 hours. HbA1C: No results for input(s): HGBA1C in the last 72 hours. CBG: Recent Labs  Lab 08/02/21 1336 08/02/21 2100 08/03/21 0959  GLUCAP 104* 116* 114*   Lipid Profile: No results for input(s): CHOL, HDL, LDLCALC, TRIG, CHOLHDL, LDLDIRECT in the last 72 hours. Thyroid Function Tests: No results for input(s): TSH, T4TOTAL, FREET4, T3FREE, THYROIDAB in the last 72  hours. Anemia Panel: No results for input(s): VITAMINB12, FOLATE, FERRITIN, TIBC, IRON, RETICCTPCT in the last 72 hours. Sepsis Labs: No results for input(s): PROCALCITON, LATICACIDVEN in the last 168 hours.  Recent Results (from the past 240 hour(s))  Resp Panel by RT-PCR (Flu A&B, Covid) Nasopharyngeal Swab     Status: None   Collection Time: 08/02/21  6:35 PM   Specimen: Nasopharyngeal Swab; Nasopharyngeal(NP) swabs in vial transport medium  Result Value Ref Range Status   SARS Coronavirus 2 by RT PCR NEGATIVE NEGATIVE Final    Comment: (NOTE) SARS-CoV-2 target nucleic acids are NOT DETECTED.  The SARS-CoV-2 RNA is generally detectable in upper respiratory specimens during the acute phase of infection. The lowest concentration of SARS-CoV-2 viral copies this assay can detect is 138 copies/mL. A negative result does not preclude SARS-Cov-2 infection and should not be used as the sole basis for treatment or other patient management decisions. A negative result may occur with  improper specimen collection/handling, submission of specimen other than nasopharyngeal swab, presence of viral mutation(s) within the  areas targeted by this assay, and inadequate number of viral copies(<138 copies/mL). A negative result must be combined with clinical observations, patient history, and epidemiological information. The expected result is Negative.  Fact Sheet for Patients:  EntrepreneurPulse.com.au  Fact Sheet for Healthcare Providers:  IncredibleEmployment.be  This test is no t yet approved or cleared by the [place] FDA and  has been authorized for detection and/or diagnosis of SARS-CoV-2 by FDA under an Emergency Use Authorization (EUA). This EUA will remain  in effect (meaning this test can be used) for the duration of the COVID-19 declaration under Section 564(b)(1) of the Act, 21 U.S.C.section 360bbb-3(b)(1), unless the authorization is  terminated  or revoked sooner.       Influenza A by PCR NEGATIVE NEGATIVE Final   Influenza B by PCR NEGATIVE NEGATIVE Final    Comment: (NOTE) The Xpert Xpress SARS-CoV-2/FLU/RSV plus assay is intended as an aid in the diagnosis of influenza from Nasopharyngeal swab specimens and should not be used as a sole basis for treatment. Nasal washings and aspirates are unacceptable for Xpert Xpress SARS-CoV-2/FLU/RSV testing.  Fact Sheet for Patients: EntrepreneurPulse.com.au  Fact Sheet for Healthcare Providers: IncredibleEmployment.be  This test is not yet approved or cleared by the [place] FDA and has been authorized for detection and/or diagnosis of SARS-CoV-2 by FDA under an Emergency Use Authorization (EUA). This EUA will remain in effect (meaning this test can be used) for the duration of the COVID-19 declaration under Section 564(b)(1) of the Act, 21 U.S.C. section 360bbb-3(b)(1), unless the authorization is terminated or revoked.  Performed at [facility], [address], [place], [place] [address]          Radiology Studies: CT Cervical Spine Wo Contrast  Result Date: 08/02/2021 CLINICAL DATA:  Neck trauma (Age >= 65y) EXAM: CT CERVICAL SPINE WITHOUT CONTRAST TECHNIQUE: Multidetector CT imaging of the cervical spine was performed without intravenous contrast. Multiplanar CT image reconstructions were also generated. COMPARISON:  None. FINDINGS: Alignment: Straightening of the normal cervical lordosis likely due to patient positioning. Skull base and vertebrae: There is a possible small anterior inferior endplate fracture of C6, with a non corticated margin. This was identified as well on [place]. Soft tissues and spinal canal: No prevertebral fluid or swelling. No visible canal hematoma. Disc levels: There is multilevel degenerative disc disease, moderate at C5-C6 and C6-C7. Posterior disc osteophyte complexes at  these levels. Upper chest: Negative Other: There are multiple hypodense thyroid nodules bilaterally, largest measuring up to 3.7 cm in the right thyroid lobe. IMPRESSION: Possible small anterior inferior endplate fracture of C6. This could represent sequela of degenerative osteophyte formation but there appears to be a non-corticated margin. No priors for comparison. Consider MRI for further evaluation. Electronically Signed   By: Maurine Simmering M.D.   On: 08/02/2021 14:26   MR ANGIO HEAD WO CONTRAST  Result Date: 08/02/2021 CLINICAL DATA:  Started feeling dizzy, weakness, fell and hit head EXAM: MRI HEAD WITHOUT CONTRAST MRA HEAD WITHOUT CONTRAST MRA NECK WITHOUT CONTRAST TECHNIQUE: Multiplanar, multiecho pulse sequences of the brain and surrounding structures were obtained without intravenous contrast. Angiographic images of the Circle of Willis were obtained using MRA technique without intravenous contrast. Angiographic images of the neck were obtained using MRA technique without intravenous contrast. Carotid stenosis measurements (when applicable) are obtained utilizing NASCET criteria, using the distal internal carotid diameter as the denominator. COMPARISON:  Same-day noncontrast head CT FINDINGS: MRI HEAD FINDINGS Brain: There is no evidence of acute  intracranial hemorrhage, extra-axial fluid collection, or acute infarct. There is mild parenchymal volume loss. The ventricles are normal in size. Patchy FLAIR signal abnormality in the subcortical and periventricular white matter is nonspecific but likely reflects sequela of chronic white matter microangiopathy. There is no solid mass lesion.  There is no midline shift. Vascular: Normal flow voids. Skull and upper cervical spine: There is no marrow signal abnormality. Sinuses/Orbits: The paranasal sinuses are clear. The globes and orbits are unremarkable. Other: There is a large hematoma in the left forehead. MRA HEAD FINDINGS Anterior circulation: There is  multifocal irregularity of the intracranial ICAs likely reflecting atherosclerotic disease. There is up to moderate stenosis bilaterally. There is a laterally directed outpouching arising from the left paraophthalmic segment suspicious for a small aneurysm measuring approximally 2 mm in depth (5-109). The bilateral MCAs are patent, without hemodynamically significant stenosis or occlusion. The bilateral ACAs are patent. The anterior communicating artery is patent. Posterior circulation: The bilateral V4 segments are patent. The basilar artery is patent. The bilateral PCAs are patent. The posterior communicating arteries are not definitely seen. Anatomic variants: None MRA NECK FINDINGS The aortic arch is not included within the field of view. The vessels of the neck are imaged from approximally the C6 level through the level of the dens. Right carotid system: The imaged right common, internal, and external carotid arteries are patent with antegrade flow, without hemodynamically significant stenosis or occlusion. There is no evidence of dissection. Left carotid system: The imaged left common, internal, and external carotid arteries are patent with antegrade flow, without hemodynamically significant stenosis or occlusion. There is no evidence of dissection. Vertebral arteries: The imaged vertebral arteries are patent with antegrade flow, without hemodynamically significant stenosis or occlusion. No evidence of dissection. IMPRESSION: MR HEAD: 1. No acute intracranial pathology. 2. Mild parenchymal volume loss and chronic white matter microangiopathy. 3. Large left forehead hematoma. MRA HEAD: 1. Atherosclerotic irregularity of the bilateral intracranial ICAs resulting in up to moderate stenosis bilaterally. No other hemodynamically significant stenosis or occlusion in the intracranial vasculature. 2. 2 mm laterally directed outpouching arising from the ophthalmic segment of the left ICA suspicious for a small  aneurysm. MRA NECK: 1. The aortic arch is not included within the field of view. The vessels of the neck are imaged from approximally the C6 level through the level of the dens. 2. Patent vasculature of the neck through the imaged portions, without evidence of hemodynamically significant stenosis, occlusion, or dissection. Electronically Signed   By: Valetta Mole M.D.   On: 08/02/2021 16:10   MR ANGIO NECK WO CONTRAST  Result Date: 08/02/2021 CLINICAL DATA:  Started feeling dizzy, weakness, fell and hit head EXAM: MRI HEAD WITHOUT CONTRAST MRA HEAD WITHOUT CONTRAST MRA NECK WITHOUT CONTRAST TECHNIQUE: Multiplanar, multiecho pulse sequences of the brain and surrounding structures were obtained without intravenous contrast. Angiographic images of the Circle of Willis were obtained using MRA technique without intravenous contrast. Angiographic images of the neck were obtained using MRA technique without intravenous contrast. Carotid stenosis measurements (when applicable) are obtained utilizing NASCET criteria, using the distal internal carotid diameter as the denominator. COMPARISON:  Same-day noncontrast head CT FINDINGS: MRI HEAD FINDINGS Brain: There is no evidence of acute intracranial hemorrhage, extra-axial fluid collection, or acute infarct. There is mild parenchymal volume loss. The ventricles are normal in size. Patchy FLAIR signal abnormality in the subcortical and periventricular white matter is nonspecific but likely reflects sequela of chronic white matter microangiopathy. There is no solid  mass lesion.  There is no midline shift. Vascular: Normal flow voids. Skull and upper cervical spine: There is no marrow signal abnormality. Sinuses/Orbits: The paranasal sinuses are clear. The globes and orbits are unremarkable. Other: There is a large hematoma in the left forehead. MRA HEAD FINDINGS Anterior circulation: There is multifocal irregularity of the intracranial ICAs likely reflecting atherosclerotic  disease. There is up to moderate stenosis bilaterally. There is a laterally directed outpouching arising from the left paraophthalmic segment suspicious for a small aneurysm measuring approximally 2 mm in depth (5-109). The bilateral MCAs are patent, without hemodynamically significant stenosis or occlusion. The bilateral ACAs are patent. The anterior communicating artery is patent. Posterior circulation: The bilateral V4 segments are patent. The basilar artery is patent. The bilateral PCAs are patent. The posterior communicating arteries are not definitely seen. Anatomic variants: None MRA NECK FINDINGS The aortic arch is not included within the field of view. The vessels of the neck are imaged from approximally the C6 level through the level of the dens. Right carotid system: The imaged right common, internal, and external carotid arteries are patent with antegrade flow, without hemodynamically significant stenosis or occlusion. There is no evidence of dissection. Left carotid system: The imaged left common, internal, and external carotid arteries are patent with antegrade flow, without hemodynamically significant stenosis or occlusion. There is no evidence of dissection. Vertebral arteries: The imaged vertebral arteries are patent with antegrade flow, without hemodynamically significant stenosis or occlusion. No evidence of dissection. IMPRESSION: MR HEAD: 1. No acute intracranial pathology. 2. Mild parenchymal volume loss and chronic white matter microangiopathy. 3. Large left forehead hematoma. MRA HEAD: 1. Atherosclerotic irregularity of the bilateral intracranial ICAs resulting in up to moderate stenosis bilaterally. No other hemodynamically significant stenosis or occlusion in the intracranial vasculature. 2. 2 mm laterally directed outpouching arising from the ophthalmic segment of the left ICA suspicious for a small aneurysm. MRA NECK: 1. The aortic arch is not included within the field of view. The  vessels of the neck are imaged from approximally the C6 level through the level of the dens. 2. Patent vasculature of the neck through the imaged portions, without evidence of hemodynamically significant stenosis, occlusion, or dissection. Electronically Signed   By: Valetta Mole M.D.   On: 08/02/2021 16:10   MR BRAIN WO CONTRAST  Result Date: 08/02/2021 CLINICAL DATA:  Started feeling dizzy, weakness, fell and hit head EXAM: MRI HEAD WITHOUT CONTRAST MRA HEAD WITHOUT CONTRAST MRA NECK WITHOUT CONTRAST TECHNIQUE: Multiplanar, multiecho pulse sequences of the brain and surrounding structures were obtained without intravenous contrast. Angiographic images of the Circle of Willis were obtained using MRA technique without intravenous contrast. Angiographic images of the neck were obtained using MRA technique without intravenous contrast. Carotid stenosis measurements (when applicable) are obtained utilizing NASCET criteria, using the distal internal carotid diameter as the denominator. COMPARISON:  Same-day noncontrast head CT FINDINGS: MRI HEAD FINDINGS Brain: There is no evidence of acute intracranial hemorrhage, extra-axial fluid collection, or acute infarct. There is mild parenchymal volume loss. The ventricles are normal in size. Patchy FLAIR signal abnormality in the subcortical and periventricular white matter is nonspecific but likely reflects sequela of chronic white matter microangiopathy. There is no solid mass lesion.  There is no midline shift. Vascular: Normal flow voids. Skull and upper cervical spine: There is no marrow signal abnormality. Sinuses/Orbits: The paranasal sinuses are clear. The globes and orbits are unremarkable. Other: There is a large hematoma in the left forehead. MRA HEAD  FINDINGS Anterior circulation: There is multifocal irregularity of the intracranial ICAs likely reflecting atherosclerotic disease. There is up to moderate stenosis bilaterally. There is a laterally directed  outpouching arising from the left paraophthalmic segment suspicious for a small aneurysm measuring approximally 2 mm in depth (5-109). The bilateral MCAs are patent, without hemodynamically significant stenosis or occlusion. The bilateral ACAs are patent. The anterior communicating artery is patent. Posterior circulation: The bilateral V4 segments are patent. The basilar artery is patent. The bilateral PCAs are patent. The posterior communicating arteries are not definitely seen. Anatomic variants: None MRA NECK FINDINGS The aortic arch is not included within the field of view. The vessels of the neck are imaged from approximally the C6 level through the level of the dens. Right carotid system: The imaged right common, internal, and external carotid arteries are patent with antegrade flow, without hemodynamically significant stenosis or occlusion. There is no evidence of dissection. Left carotid system: The imaged left common, internal, and external carotid arteries are patent with antegrade flow, without hemodynamically significant stenosis or occlusion. There is no evidence of dissection. Vertebral arteries: The imaged vertebral arteries are patent with antegrade flow, without hemodynamically significant stenosis or occlusion. No evidence of dissection. IMPRESSION: MR HEAD: 1. No acute intracranial pathology. 2. Mild parenchymal volume loss and chronic white matter microangiopathy. 3. Large left forehead hematoma. MRA HEAD: 1. Atherosclerotic irregularity of the bilateral intracranial ICAs resulting in up to moderate stenosis bilaterally. No other hemodynamically significant stenosis or occlusion in the intracranial vasculature. 2. 2 mm laterally directed outpouching arising from the ophthalmic segment of the left ICA suspicious for a small aneurysm. MRA NECK: 1. The aortic arch is not included within the field of view. The vessels of the neck are imaged from approximally the C6 level through the level of the  dens. 2. Patent vasculature of the neck through the imaged portions, without evidence of hemodynamically significant stenosis, occlusion, or dissection. Electronically Signed   By: Valetta Mole M.D.   On: 08/02/2021 16:10   MR Cervical Spine Wo Contrast  Result Date: 08/02/2021 CLINICAL DATA:  Fall this morning. Dizziness with weakness. Ataxia. Cervical spine trauma. EXAM: MRI CERVICAL SPINE WITHOUT CONTRAST TECHNIQUE: Multiplanar, multisequence MR imaging of the cervical spine was performed. No intravenous contrast was administered. COMPARISON:  CT cervical spine 08/02/2021. No prior relevant studies. FINDINGS: Alignment: Straightening without focal angulation or significant listhesis. Vertebrae: Vertebral body heights are maintained, and no compression deformities are identified. There are mild endplate degenerative changes at C6-7 without suspicious marrow edema. The questioned small avulsion fracture of the inferior endplate of C6 on CT is difficult to evaluate by MRI, although there is possible mild prevertebral soft tissue swelling in this area. The facet joints appear intact. Cord: Normal in signal and caliber. Posterior Fossa, vertebral arteries, paraspinal tissues: Visualized portions of the posterior fossa appear unremarkable. As above, possible mild prevertebral soft tissue swelling from C5 through C7. In addition, there is inter spinous soft tissue edema from C3 through C6. Bilateral vertebral artery flow voids. A large heterogeneous right thyroid nodule is partially imaged, better seen on earlier cervical spine CT. Disc levels: C2-3: Normal interspace. C3-4: Mild disc bulging and uncinate spurring. No cord deformity. Mild foraminal narrowing bilaterally. C4-5: Mild disc bulging and asymmetric uncinate spurring on the right. No cord deformity. Mild to moderate right foraminal narrowing. C5-6: Loss of disc height with disc bulging and endplate osteophytes. The CSF surrounding the cord is partially  effaced without cord deformity. Moderate  osseous foraminal narrowing bilaterally. C6-7: Loss of disc height with disc bulging and endplate osteophytes. The CSF surrounding the cord is partially effaced without cord deformity. Moderate to severe right and moderate left foraminal narrowing. C7-T1: No significant disc space findings. IMPRESSION: 1. The MR findings are inconclusive regarding the questioned small avulsion fracture involving the anterior inferior endplate of C6. However, there is possible prevertebral soft tissue swelling as well as mild interspinous edema, findings suggesting hyperflexion injury. The facet joints appear intact, and there is no evidence of cord contusion. Recommend immobilization and radiographic follow-up. 2. Multilevel cervical spondylosis as described, greatest at C5-6 and C6-7 where there is associated osseous foraminal narrowing. 3. Complex right thyroid nodule measuring up to 3.7 cm on CT. Recommend thyroid [place].(Ref: J Am Coll Radiol. 2015 Feb;12(2): 143-50). Electronically Signed   By: Richardean Sale M.D.   On: 08/02/2021 15:58   DG Chest Portable 1 View  Result Date: 08/02/2021 CLINICAL DATA:  Fall, altered mental status EXAM: PORTABLE CHEST 1 VIEW COMPARISON:  04/10/2021 FINDINGS: Unchanged cardiomediastinal silhouette. There is no new airspace disease. There is no large pleural effusion. No visible pneumothorax. Unchanged upper right mediastinal prominence due to enlarged thyroid, with leftward tracheal deviation. Left axillary surgical clips. There is no acute osseous abnormality. IMPRESSION: No evidence of acute cardiopulmonary disease. Electronically Signed   By: Maurine Simmering M.D.   On: 08/02/2021 14:33   CT HEAD CODE STROKE WO CONTRAST  Result Date: 08/02/2021 CLINICAL DATA:  Code stroke. Neuro deficit, acute, stroke suspected left sided weakness, slurred speech EXAM: CT HEAD WITHOUT CONTRAST TECHNIQUE: Contiguous axial images were obtained from the base of the skull  through the vertex without intravenous contrast. COMPARISON:  February 2022 FINDINGS: Brain: There is no acute intracranial hemorrhage, mass effect, or edema. No new loss of gray-white differentiation. Ventricles and sulci are stable in size and configuration. Patchy hypoattenuation in the supratentorial white matter is nonspecific but probably reflects stable chronic microvascular ischemic changes. No extra-axial collection. Vascular: No hyperdense vessel. There is intracranial atherosclerotic calcification at the skull base. Skull: Unremarkable. Sinuses/Orbits: No acute abnormality. Other: Left frontal scalp hematoma. ASPECTS ([place] Stroke Program Early CT Score) - Ganglionic level infarction (caudate, lentiform nuclei, internal capsule, insula, M1-M3 cortex): 7 - Supraganglionic infarction (M4-M6 cortex): 3 Total score (0-10 with 10 being normal): 10 IMPRESSION: There is no acute intracranial hemorrhage or evidence of acute infarction. ASPECT score is 10. These results were communicated to Dr. [name] at 2:11 pm on 08/02/2021 by text page via the [facility] messaging system. Electronically Signed   By: Macy Mis M.D.   On: 08/02/2021 14:14        Scheduled Meds:  amLODipine  10 mg Oral Daily   carvedilol  25 mg Oral BID WC   clonazePAM  0.5 mg Oral BID   empagliflozin  10 mg Oral Daily   furosemide  80 mg Oral Daily   gabapentin  300 mg Oral BID   insulin aspart  0-9 Units Subcutaneous TID WC   lacosamide  150 mg Oral BID   levETIRAcetam  500 mg Oral BID   And   levETIRAcetam  750 mg Oral QHS   losartan  100 mg Oral Daily   sodium chloride flush  3 mL Intravenous Q12H   spironolactone  12.5 mg Oral Daily   Continuous Infusions:  sodium chloride       LOS: 1 day    Time spent: 32 minutes    Sharen Hones, MD [facility]   To contact the attending provider between 7A-7P or the covering provider during after hours 7P-7A, please log into the web site [email or web address] and  access using universal [place] password for that web site. If you do not have the password, please call the hospital operator.  08/03/2021, 12:02 PM

## 2021-08-03 NOTE — Plan of Care (Signed)

## 2021-08-03 NOTE — Progress Notes (Signed)
   08/03/21 1227  Assess: MEWS Score  Temp (!) 97.4 F (36.3 C)  BP (!) 80/58  Pulse Rate 70  Resp 14  SpO2 100 %  O2 Device Room Air  Patient Activity (if Appropriate) In bed  Assess: MEWS Score  MEWS Temp 0  MEWS Systolic 2  MEWS Pulse 0  MEWS RR 0  MEWS LOC 0  MEWS Score 2  MEWS Score Color Yellow  Assess: if the MEWS score is Yellow or Red  Were vital signs taken at a resting state? Yes  Focused Assessment Change from prior assessment (see assessment flowsheet)  Does the patient meet 2 or more of the SIRS criteria? No  MEWS guidelines implemented *See Row Information* Yes  Treat  MEWS Interventions Escalated (See documentation below)  Pain Scale 0-10  Pain Score 0  Notify: Charge Nurse/RN  Name of Charge Nurse/RN Notified Gerald Stabs RN  Date Charge Nurse/RN Notified 08/03/21  Time Charge Nurse/RN Notified 1229  Notify: Provider  Provider Name/Title Zhang  Date Provider Notified 08/03/21  Time Provider Notified 1229  Notification Type Page  Notification Reason  (BP low yellow)  Provider response See new orders  Date of Provider Response 08/03/21  Time of Provider Response 1239  Assess: SIRS CRITERIA  SIRS Temperature  0  SIRS Pulse 0  SIRS Respirations  0  SIRS WBC 0  SIRS Score Sum  0  Will recheck BP after bolus.

## 2021-08-03 NOTE — Plan of Care (Signed)
  Problem: Clinical Measurements: Goal: Diagnostic test results will improve Outcome: Progressing   Problem: Coping: Goal: Level of anxiety will decrease Outcome: Progressing   

## 2021-08-03 NOTE — Evaluation (Signed)
Occupational Therapy Evaluation Patient Details Name: Anne Phillips MRN: [id number] DOB: 1951/06/10 Today's Date: 08/03/2021   History of Present Illness Pt admitted to [facility] on 08/02/21 or c/o fall and hitting head, endorsing dizziness as she went to stand. Code stroke called due to dysarthria and L sided weakness. Significant PMH includes: CAD s/p PCI to RCA, HTN, HDL, HFpEF, NSTEMI, breast CA, depression, seizure disorder, and CHF. Imaging revealed small naterior inferior endplate fx at C6 but negative for acute infarct. Pt placed in C-collar and will need to remain in this until she's able to be evaluated by OP neurosurgery or orthospine. Suspect mildly elevated troponin is due to combination of mild dehydration, CKD, and underlying disease. Per neurology consult, pt symptoms believed to be diffuse ataxia secondary to medication side effects.   Clinical Impression   Pt seen for OT evaluation this date in setting of acute hospitalization d/t fall. She presents this date with some confusion which RN notes is a change and suspects is 2/2 medication. While pt is still oriented, she requires increased processing time and is somewhat impulsive and restless. She requires MIN/MOD A for LB ADLs, and CGA for ADL transfers for safety. She demos poor safety awareness and requires cues throughout. She only tolerates standing in short bouts and demos POOR balance. Will continue to follow acutely. Anticipate she will require STR upon d/c as she is high fall risk and requires increased ADL assistance at this time.      Recommendations for follow up therapy are one component of a multi-disciplinary discharge planning process, led by the attending physician.  Recommendations may be updated based on patient status, additional functional criteria and insurance authorization.   Follow Up Recommendations  Skilled nursing-short term rehab (<3 hours/day)    Assistance Recommended at Discharge Frequent or constant  Supervision/Assistance  Functional Status Assessment  Patient has had a recent decline in their functional status and demonstrates the ability to make significant improvements in function in a reasonable and predictable amount of time.  Equipment Recommendations  BSC/3in1;Tub/shower seat    Recommendations for Other Services       Precautions / Restrictions Precautions Precautions: Fall;Cervical Precaution Booklet Issued: No Precaution Comments: hard c-collar; no aggressive cervical ROM, no push/pull/lift for pain management Restrictions Weight Bearing Restrictions: No      Mobility Bed Mobility Overal bed mobility: Modified Independent             General bed mobility comments: increased time    Transfers Overall transfer level: Needs assistance Equipment used: Rolling walker (2 wheels) Transfers: Sit to/from Stand Sit to Stand: Min guard;From elevated surface           General transfer comment: cues for safety, pt somewhat impulsive      Balance Overall balance assessment: Needs assistance Sitting-balance support: No upper extremity supported;Feet supported Sitting balance-Leahy Scale: Good Sitting balance - Comments: no LOB in sitting   Standing balance support: During functional activity;Bilateral upper extremity supported Standing balance-Leahy Scale: Poor Standing balance comment: near LOB x1 requiring correction for righting/fall prevention from therapist.                           ADL either performed or assessed with clinical judgement   ADL  General ADL Comments: SETUP for seated UB ADLs. MOD A for seated/bed level LB ADLs as well as cues throughout to sequence/attend.     Vision Patient Visual Report: No change from baseline       Perception     Praxis      Pertinent Vitals/Pain Pain Assessment: 0-10 Pain Score: 3  Pain Location: head Pain Descriptors / Indicators:  Grimacing Pain Intervention(s): Monitored during session     Hand Dominance Right   Extremity/Trunk Assessment Upper Extremity Assessment Upper Extremity Assessment:  (ROM mostly appears WFL, diffucult to test (MMT not tested) d/t cervical precautions and cognition)   Lower Extremity Assessment Lower Extremity Assessment: Defer to PT evaluation;Overall [facility] for tasks assessed   Cervical / Trunk Assessment Cervical / Trunk Assessment: Normal   Communication Communication Communication: No difficulties   Cognition Arousal/Alertness: Awake/alert Behavior During Therapy: Restless Overall Cognitive Status: Impaired/Different from baseline                                 General Comments: pt more confused on OT session this date which is a notable change according to patient's nurse as well. She was oriented, but had diffuclty following commands and was somewhat restless     General Comments  significant edema and ecchymosis to L eye and L forehead; hard C-collar donned upon entry, assisted with adjusting however pt will need smaller C-collar    Exercises Other Exercises Other Exercises: OT engages pt in ed re: role, pt with poor comprehension and carryover at this time d/t confusion (RN suspects r/t medication) Other Exercises: Pt educated re: PT role/POC, DC recommendations, hard C-collar, cervical precautions for pain management, safety with mobility. She verbalized understanding.   Shoulder Instructions      Home Living Family/patient expects to be discharged to:: Private residence Living Arrangements: Other relatives Available Help at Discharge: Family (older sister, [name], able to provide 24/7 supervision; likely unable to provide physical assist as pt states "she'll do her best") Type of Home: House (rental) Home Access: Stairs to enter Technical brewer of Steps: 3-4 Entrance Stairs-Rails: Right Home Layout: One level     Bathroom Shower/Tub:  Teacher, early years/pre: Standard     Home Equipment: Conservation officer, nature (2 wheels);Cane - single point;Tub bench   Additional Comments: intermittent use of assitive device 'when (she) needs it."      Prior Functioning/Environment Prior Level of Function : Needs assist       Physical Assist : ADLs (physical)   ADLs (physical): IADLs Mobility Comments: Reports being Ind with ambulation without AD; intermittent use of RW/SPC when needed. "Very few" falls over the past 27mo; reports UE fx from previous fall ADLs Comments: Reports being Ind with ADL's and light IADL's; sister assist with meals/cooking and transportation.        OT Problem List: Decreased strength;Decreased activity tolerance      OT Treatment/Interventions: Self-care/ADL training;Therapeutic exercise;DME and/or AE instruction;Patient/family education;Balance training;Therapeutic activities    OT Goals(Current goals can be found in the care plan section) Acute Rehab OT Goals Patient Stated Goal: none stated OT Goal Formulation: Patient unable to participate in goal setting Time For Goal Achievement: 08/17/21 Potential to Achieve Goals: Fair ADL Goals Pt Will Perform Lower Body Dressing: with min guard assist;sit to/from stand;with adaptive equipment Pt Will Transfer to Toilet: with min guard assist;ambulating Pt Will Perform Toileting - Clothing Manipulation and hygiene: with min guard  assist;sit to/from stand  OT Frequency: Min 2X/week   Barriers to D/C:            Co-evaluation              AM-PAC OT "6 Clicks" Daily Activity     Outcome Measure Help from another person eating meals?: A Little Help from another person taking care of personal grooming?: A Little Help from another person toileting, which includes using toliet, bedpan, or urinal?: A Lot Help from another person bathing (including washing, rinsing, drying)?: A Lot Help from another person to put on and taking off regular upper  body clothing?: A Little Help from another person to put on and taking off regular lower body clothing?: A Lot 6 Click Score: 15   End of Session Equipment Utilized During Treatment: Gait belt;Rolling walker (2 wheels) Nurse Communication: Mobility status  Activity Tolerance: Patient tolerated treatment well Patient left: in bed;with call bell/phone within reach  OT Visit Diagnosis: Unsteadiness on feet (R26.81)                Time: 3276-1470 OT Time Calculation (min): 12 min Charges:  OT General Charges $OT Visit: 1 Visit OT Evaluation $OT Eval Moderate Complexity: Armstrong, MS, OTR/L ascom [phone] 08/03/21, 2:32 PM

## 2021-08-03 NOTE — Progress Notes (Addendum)
Subjective: No significnat events overnight.   Exam: Vitals:   08/03/21 0519 08/03/21 0926  BP: 122/77 118/81  Pulse: 89 91  Resp: 16 16  Temp:  (!) 97.4 F (36.3 C)  SpO2: 100% 99%   Gen: In bed, NAD HEENT: bilateral black eyes Resp: non-labored breathing, no acute distress Abd: soft, nt  Neuro: MS: Somnolent but easily arousable, she does not like opening her eyes because of pain. CN: She would not let me visualize her left eye, but in her right eyes she has significant improvement in nystagmus, full EOM Motor: She is able to hold arms outstretched without drift, does not cooperate well with formal confrontational testing, gives poor effort Sensory: Intact light touch Cerebellar: Significant improvement in ataxia bilaterally  Pertinent Labs: Creatinine 1.5   Impression: 70 year old female who presented with acute onset ataxia causing a fall and head injury.  I continue to suspect that she may have taken two doses of one of her medicines, but this is not clear.  Labyrinthitis/ischemia are unlikely given the bilateral nature of her ataxia on presentation.  If dizziness continues to be a long-term problem, could consider decreasing her Vimpat as I suspect this would be the most likely culprit, but based on her current exam I would not favor this at this time.  She does still have some mild dizziness, and I do wonder if mild concussion is playing some role here as well.  Recommendations: 1) continue home antiepileptics 2) PT/OT evaluations 3) care from this point forward is supportive, neurology will be available on an as-needed basis, please call further questions or concerns.  Roland Rack, MD [facility] [phone]  If 7pm- 7am, please page neurology on call as listed in [place].

## 2021-08-04 DIAGNOSIS — D513 Other dietary vitamin B12 deficiency anemia: Secondary | ICD-10-CM

## 2021-08-04 LAB — GLUCOSE, CAPILLARY
Glucose-Capillary: 81 mg/dL (ref 70–99)
Glucose-Capillary: 102 mg/dL — ABNORMAL HIGH (ref 70–99)
Glucose-Capillary: 94 mg/dL (ref 70–99)
Glucose-Capillary: 134 mg/dL — ABNORMAL HIGH (ref 70–99)

## 2021-08-04 LAB — AMMONIA: Ammonia: 23 umol/L (ref 9–35)

## 2021-08-04 LAB — BASIC METABOLIC PANEL
Anion gap: 9 (ref 5–15)
BUN: 40 mg/dL — ABNORMAL HIGH (ref 8–23)
CO2: 21 mmol/L — ABNORMAL LOW (ref 22–32)
Calcium: 8.8 mg/dL — ABNORMAL LOW (ref 8.9–10.3)
Chloride: 104 mmol/L (ref 98–111)
Creatinine, Ser: 1.77 mg/dL — ABNORMAL HIGH (ref 0.44–1.00)
GFR, Estimated: 31 mL/min — ABNORMAL LOW (ref >60)
Glucose, Bld: 89 mg/dL (ref 70–99)
Potassium: 3.7 mmol/L (ref 3.5–5.1)
Sodium: 134 mmol/L — ABNORMAL LOW (ref 135–145)

## 2021-08-04 LAB — PHOSPHORUS: Phosphorus: 4.5 mg/dL (ref 2.5–4.6)

## 2021-08-04 LAB — CORTISOL-AM, BLOOD: Cortisol - AM: 52.5 ug/dL — ABNORMAL HIGH (ref 6.7–22.6)

## 2021-08-04 LAB — MAGNESIUM: Magnesium: 1.7 mg/dL (ref 1.7–2.4)

## 2021-08-04 MED ORDER — ENOXAPARIN SODIUM 40 MG/0.4ML IJ SOSY: 40.0000 mg | PREFILLED_SYRINGE | INTRAMUSCULAR | Status: DC

## 2021-08-04 MED ORDER — ENOXAPARIN SODIUM 30 MG/0.3ML IJ SOSY
30.0000 mg | PREFILLED_SYRINGE | INTRAMUSCULAR | Status: DC
  Administered 2021-08-04: 22:00:00 30 mg via SUBCUTANEOUS
  Filled 2021-08-04: qty 0.3

## 2021-08-04 MED ORDER — CYANOCOBALAMIN 1000 MCG/ML IJ SOLN
1000.0000 ug | Freq: Once | INTRAMUSCULAR | Status: AC
Start: 2021-08-04 — End: 2021-08-04
  Administered 2021-08-04: 16:00:00 1000 ug via INTRAMUSCULAR
  Filled 2021-08-04: qty 1

## 2021-08-04 MED ORDER — VITAMIN B-12 1000 MCG PO TABS
1000.0000 ug | ORAL_TABLET | Freq: Every day | ORAL | Status: DC
  Administered 2021-08-05 – 2021-08-06 (×2): 1000 ug via ORAL
  Filled 2021-08-04 (×2): qty 1

## 2021-08-04 NOTE — NC FL2 (Signed)
[place] LEVEL OF CARE SCREENING TOOL     IDENTIFICATION  Patient Name: Anne Phillips Birthdate: 05-08-1951 Sex: female Admission Date (Current Location): 08/02/2021  [place] and [place] Number:  Engineering geologist and Address:  [facility], [address] [place], [place] [address]      Provider Number: [id number]  Attending Physician Name and Address:  Sharen Hones, MD  Relative Name and Phone Number:  Orion Modest [phone]    Current Level of Care: Hospital Recommended Level of Care: [place] Prior Approval Number:    Date Approved/Denied: 08/22/15 PASRR Number: [id number]  Discharge Plan: SNF    Current Diagnoses: Patient Active Problem List   Diagnosis Date Noted   Elevated troponin 08/03/2021   Hyponatremia 08/03/2021   AMS (altered mental status) 08/02/2021   Stage 3b chronic kidney disease ([place]) 07/08/2021   Carbon monoxide poisoning 10/03/2020   Fall at home, initial encounter 10/03/2020   Seizures ([place])    Hypertension    B12 deficiency anemia 06/29/2018   Goiter 06/05/2018   Falls frequently 12/21/2017   Invasive ductal carcinoma of breast, female, left ([place]) 11/18/2017   Focal epilepsy with impairment of consciousness, intractable ([place]) 01/17/2016   Depression 08/27/2015   Acute delirium 08/27/2015   Depression, major, single episode, moderate (HCC)    (HFpEF) heart failure with preserved ejection fraction (HCC)    Disorientation    Pyrexia    Hypoxia    Non-traumatic rhabdomyolysis    Coronary artery disease involving native coronary artery of native heart without angina pectoris    Sepsis ([place]) 08/20/2015   NSTEMI (non-ST elevated myocardial infarction) ([place]) 08/20/2015   Rhabdomyolysis 08/20/2015    Orientation RESPIRATION BLADDER Height & Weight     Self, Time, Situation, Place  Normal Continent Weight: 57.4 kg Height:  5\' 2"  (157.5 cm)  BEHAVIORAL SYMPTOMS/MOOD  NEUROLOGICAL BOWEL NUTRITION STATUS      Continent Diet  AMBULATORY STATUS COMMUNICATION OF NEEDS Skin   Supervision Verbally Normal                       Personal Care Assistance Level of Assistance  Bathing, Dressing Bathing Assistance: Limited assistance   Dressing Assistance: Limited assistance     Functional Limitations Info             SPECIAL CARE FACTORS FREQUENCY                       Contractures      Additional Factors Info                  Current Medications (08/04/2021):  This is the current hospital active medication list Current Facility-Administered Medications  Medication Dose Route Frequency Provider Last Rate Last Admin   0.9 %  sodium chloride infusion   Intravenous Continuous Sharen Hones, MD 50 mL/hr at 08/04/21 1305 Rate Change at 08/04/21 1305   acetaminophen (TYLENOL) tablet 650 mg  650 mg Oral Q6H PRN Clarnce Flock, MD   650 mg at 08/03/21 2131   Or   acetaminophen (TYLENOL) suppository 650 mg  650 mg Rectal Q6H PRN Clarnce Flock, MD       albuterol (PROVENTIL) (2.5 MG/3ML) 0.083% nebulizer solution 2.5 mg  2.5 mg Nebulization Q6H PRN Clarnce Flock, MD       carvedilol (COREG) tablet 6.25 mg  6.25 mg Oral BID WC Sharen Hones, MD  cefTRIAXone (ROCEPHIN) 1 g in sodium chloride 0.9 % 100 mL IVPB  1 g Intravenous Q24H Sharen Hones, MD 200 mL/hr at 08/04/21 1304 1 g at 08/04/21 1304   clonazePAM (KLONOPIN) disintegrating tablet 0.25 mg  0.25 mg Oral BID Sharen Hones, MD   0.25 mg at 08/04/21 0814   enoxaparin (LOVENOX) injection 30 mg  30 mg Subcutaneous Q24H Hallaji, Sheema M, RPH       gabapentin (NEURONTIN) capsule 300 mg  300 mg Oral BID Clarnce Flock, MD   300 mg at 08/04/21 0814   insulin aspart (novoLOG) injection 0-9 Units  0-9 Units Subcutaneous TID WC Clarnce Flock, MD       lacosamide (VIMPAT) tablet 150 mg  150 mg Oral BID Clarnce Flock, MD   150 mg at 08/04/21 0815   levETIRAcetam  (KEPPRA) tablet 500 mg  500 mg Oral BID Clarnce Flock, MD   500 mg at 08/04/21 1304   And   levETIRAcetam (KEPPRA) tablet 750 mg  750 mg Oral QHS Clarnce Flock, MD   750 mg at 08/03/21 2239   ondansetron (ZOFRAN) tablet 4 mg  4 mg Oral Q6H PRN Clarnce Flock, MD       Or   ondansetron [facility]) injection 4 mg  4 mg Intravenous Q6H PRN Clarnce Flock, MD       oxyCODONE (Oxy IR/ROXICODONE) immediate release tablet 5 mg  5 mg Oral Q4H PRN Clarnce Flock, MD       polyethylene glycol (MIRALAX / GLYCOLAX) packet 17 g  17 g Oral Daily PRN Clarnce Flock, MD       sodium chloride flush (NS) 0.9 % injection 3 mL  3 mL Intravenous Q12H Clarnce Flock, MD   3 mL at 08/04/21 0816   traZODone (DESYREL) tablet 25 mg  25 mg Oral QHS PRN Clarnce Flock, MD   25 mg at 08/03/21 2038   [START ON 08/05/2021] vitamin B-12 (CYANOCOBALAMIN) tablet 1,000 mcg  1,000 mcg Oral Daily Sharen Hones, MD         Discharge Medications: Please see discharge summary for a list of discharge medications.  Relevant Imaging Results:  Relevant Lab Results:   Additional Information SSN: [id number]  Harriet Masson, RN

## 2021-08-04 NOTE — Progress Notes (Signed)
PROGRESS NOTE    Anne Phillips  OBS:[id number] DOB: March 07, 1951 DOA: 08/02/2021 PCP: Merryl Hacker, No    Brief Narrative:  Anne Phillips is a 70 y.o. female with history of failure with preserved ejection fraction, CAD, NSTEMI, seizure disorder, breast cancer, CKD, depression, hypertension, frequent falls, who presents after a fall with altered mental status. Cervical spine MRI showed neck concussion with soft tissue damage.  No fracture.  Brain MRI did not show any acute changes.  Neck MRI showed complex right thyroid nodule and a 3 cm. Patient developed significant hypotension in hospital while taking her home dose blood pressure medicines.  Most of blood pressure medicines discontinued, blood pressure is better.   Assessment & Plan:   Principal Problem:   AMS (altered mental status) Active Problems:   (HFpEF) heart failure with preserved ejection fraction (HCC)   Non-traumatic rhabdomyolysis   Coronary artery disease involving native coronary artery of native heart without angina pectoris   Depression   Seizures ([place])   Hypertension   Anemia   Falls frequently   Invasive ductal carcinoma of breast, female, left (HCC)   Stage 3b chronic kidney disease (HCC)   Elevated troponin   Hyponatremia  Altered mental status secondary to brain concussion. Ground-level fall. Neck contusion with Hyperflex injury Ataxia. Patient mental status seem to be better today.  Continue neck collar until seen by neurosurgery in 2 weeks. B12 level was low, start 1 dose injection and followed by oral.  Vitamin B12 deficient anemia. As above.  Essential hypertension.   Hypotension. Polypharmacy. Patient frequent fall probably due to hypotension.  Discontinued most of blood pressure medicine, reduce beta-blocker to Coreg 6.25 mg twice a day. Blood pressure is better.   Hyponatremia. Chronic kidney disease stage IIIb Slightly worsening renal function due to hypotension yesterday.  Continue gentle  rehydration for another day.  Chronic diastolic congestive heart failure  Elevated troponin. Condition stable.  Type 2 diabetes. Continue sliding scale insulin.  History of seizure disorder. Resume home medicines.  No seizure at this time.  Thyroid nodules Ultrasound showed multiple thyroid nodules. Patient will need to follow-up with ENT as outpatient to consider for fine-needle biopsy.    DVT prophylaxis: Lovenox Code Status: full Family Communication:  Disposition Plan:    Status is: Inpatient  Remains inpatient appropriate because: Severity of disease, unsafe discharge.        I/O last 3 completed shifts: In: 1625.5 [P.O.:480; I.V.:45.5; IV Piggyback:1100] Out: 50 [Urine:50] Total I/O In: 40 [P.O.:40] Out: -     Subjective: Patient doing better today.  She is a little bit sleepy this morning.  Denies any short of breath or cough. No fever or chills. No dysuria hematuria. No abdominal pain or nausea vomiting.  Objective: Vitals:   08/03/21 2344 08/04/21 0515 08/04/21 0741 08/04/21 1131  BP: 104/61 (!) 125/56 119/67 98/62  Pulse: 82 63 73 68  Resp: 16 14 16 16   Temp: (!) 97.5 F (36.4 C) 97.8 F (36.6 C) 98 F (36.7 C) 98 F (36.7 C)  TempSrc: Oral Oral Oral Oral  SpO2: 99% 99% 100% 100%  Weight:      Height:        Intake/Output Summary (Last 24 hours) at 08/04/2021 1300 Last data filed at 08/04/2021 0900 Gross per 24 hour  Intake 1425.5 ml  Output --  Net 1425.5 ml   Filed Weights   08/02/21 1336 08/02/21 2100  Weight: 59 kg 57.4 kg    Examination:  General exam:  Appears calm and comfortable  Respiratory system: Clear to auscultation. Respiratory effort normal. Cardiovascular system: S1 & S2 heard, RRR. No JVD, murmurs, rubs, gallops or clicks. No pedal edema. Gastrointestinal system: Abdomen is nondistended, soft and nontender. No organomegaly or masses felt. Normal bowel sounds heard. Central nervous system: Alert and oriented x2.  No focal neurological deficits. Extremities: Symmetric 5 x 5 power. Skin: No rashes, lesions or ulcers Psychiatry:  Mood & affect appropriate.     Data Reviewed: I have personally reviewed following labs and imaging studies  CBC: Recent Labs  Lab 08/02/21 1433 08/03/21 0616  WBC 7.4 6.4  NEUTROABS 6.4  --   HGB 11.3* 10.2*  HCT 34.7* 30.7*  MCV 89.9 87.0  PLT 174 161   Basic Metabolic Panel: Recent Labs  Lab 08/02/21 1433 08/03/21 0616 08/04/21 0440  NA 131* 133* 134*  K 3.5 3.7 3.7  CL 97* 99 104  CO2 23 22 21*  GLUCOSE 98 96 89  BUN 29* 34* 40*  CREATININE 1.52* 1.55* 1.77*  CALCIUM 9.1 9.1 8.8*  MG  --   --  1.7  PHOS  --   --  4.5   GFR: Estimated Creatinine Clearance: 23.7 mL/min (A) (by C-G formula based on SCr of 1.77 mg/dL (H)). Liver Function Tests: Recent Labs  Lab 08/02/21 1433  AST 19  ALT 9  ALKPHOS 130*  BILITOT 0.7  PROT 7.9  ALBUMIN 4.4   No results for input(s): LIPASE, AMYLASE in the last 168 hours. Recent Labs  Lab 08/04/21 0440  AMMONIA 23   Coagulation Profile: Recent Labs  Lab 08/02/21 1433  INR 1.2   Cardiac Enzymes: No results for input(s): CKTOTAL, CKMB, CKMBINDEX, TROPONINI in the last 168 hours. BNP (last 3 results) No results for input(s): PROBNP in the last 8760 hours. HbA1C: No results for input(s): HGBA1C in the last 72 hours. CBG: Recent Labs  Lab 08/03/21 1222 08/03/21 1811 08/03/21 2040 08/04/21 0742 08/04/21 1132  GLUCAP 115* 107* 130* 81 102*   Lipid Profile: No results for input(s): CHOL, HDL, LDLCALC, TRIG, CHOLHDL, LDLDIRECT in the last 72 hours. Thyroid Function Tests: Recent Labs    08/03/21 0616  TSH 0.521   Anemia Panel: Recent Labs    08/03/21 0616  VITAMINB12 172*   Sepsis Labs: No results for input(s): PROCALCITON, LATICACIDVEN in the last 168 hours.  Recent Results (from the past 240 hour(s))  Urine Culture     Status: Abnormal (Preliminary result)   Collection Time:  08/02/21  4:22 PM   Specimen: Urine, Clean Catch  Result Value Ref Range Status   Specimen Description   Final    URINE, CLEAN CATCH Performed at [facility], [address], [place], [place] [address]    Special Requests   Final    NONE Performed at [facility], [address] [place], [place] [address]    Culture >=100,000 COLONIES/mL KLEBSIELLA PNEUMONIAE (A)  Final   Report Status PENDING  Incomplete  Resp Panel by RT-PCR (Flu A&B, Covid) Nasopharyngeal Swab     Status: None   Collection Time: 08/02/21  6:35 PM   Specimen: Nasopharyngeal Swab; Nasopharyngeal(NP) swabs in vial transport medium  Result Value Ref Range Status   SARS Coronavirus 2 by RT PCR NEGATIVE NEGATIVE Final    Comment: (NOTE) SARS-CoV-2 target nucleic acids are NOT DETECTED.  The SARS-CoV-2 RNA is generally detectable in upper respiratory specimens during the acute phase of infection. The lowest concentration of SARS-CoV-2  viral copies this assay can detect is 138 copies/mL. A negative result does not preclude SARS-Cov-2 infection and should not be used as the sole basis for treatment or other patient management decisions. A negative result may occur with  improper specimen collection/handling, submission of specimen other than nasopharyngeal swab, presence of viral mutation(s) within the areas targeted by this assay, and inadequate number of viral copies(<138 copies/mL). A negative result must be combined with clinical observations, patient history, and epidemiological information. The expected result is Negative.  Fact Sheet for Patients:  EntrepreneurPulse.com.au  Fact Sheet for Healthcare Providers:  IncredibleEmployment.be  This test is no t yet approved or cleared by the [place] FDA and  has been authorized for detection and/or diagnosis of SARS-CoV-2 by FDA under an Emergency Use Authorization (EUA). This EUA will remain  in  effect (meaning this test can be used) for the duration of the COVID-19 declaration under Section 564(b)(1) of the Act, 21 U.S.C.section 360bbb-3(b)(1), unless the authorization is terminated  or revoked sooner.       Influenza A by PCR NEGATIVE NEGATIVE Final   Influenza B by PCR NEGATIVE NEGATIVE Final    Comment: (NOTE) The Xpert Xpress SARS-CoV-2/FLU/RSV plus assay is intended as an aid in the diagnosis of influenza from Nasopharyngeal swab specimens and should not be used as a sole basis for treatment. Nasal washings and aspirates are unacceptable for Xpert Xpress SARS-CoV-2/FLU/RSV testing.  Fact Sheet for Patients: EntrepreneurPulse.com.au  Fact Sheet for Healthcare Providers: IncredibleEmployment.be  This test is not yet approved or cleared by the [place] FDA and has been authorized for detection and/or diagnosis of SARS-CoV-2 by FDA under an Emergency Use Authorization (EUA). This EUA will remain in effect (meaning this test can be used) for the duration of the COVID-19 declaration under Section 564(b)(1) of the Act, 21 U.S.C. section 360bbb-3(b)(1), unless the authorization is terminated or revoked.  Performed at [facility], [address] [place], [place] [address]          Radiology Studies: CT Cervical Spine Wo Contrast  Result Date: 08/02/2021 CLINICAL DATA:  Neck trauma (Age >= 65y) EXAM: CT CERVICAL SPINE WITHOUT CONTRAST TECHNIQUE: Multidetector CT imaging of the cervical spine was performed without intravenous contrast. Multiplanar CT image reconstructions were also generated. COMPARISON:  None. FINDINGS: Alignment: Straightening of the normal cervical lordosis likely due to patient positioning. Skull base and vertebrae: There is a possible small anterior inferior endplate fracture of C6, with a non corticated margin. This was identified as well on [place]. Soft tissues and spinal canal: No  prevertebral fluid or swelling. No visible canal hematoma. Disc levels: There is multilevel degenerative disc disease, moderate at C5-C6 and C6-C7. Posterior disc osteophyte complexes at these levels. Upper chest: Negative Other: There are multiple hypodense thyroid nodules bilaterally, largest measuring up to 3.7 cm in the right thyroid lobe. IMPRESSION: Possible small anterior inferior endplate fracture of C6. This could represent sequela of degenerative osteophyte formation but there appears to be a non-corticated margin. No priors for comparison. Consider MRI for further evaluation. Electronically Signed   By: Maurine Simmering M.D.   On: 08/02/2021 14:26   MR ANGIO HEAD WO CONTRAST  Result Date: 08/02/2021 CLINICAL DATA:  Started feeling dizzy, weakness, fell and hit head EXAM: MRI HEAD WITHOUT CONTRAST MRA HEAD WITHOUT CONTRAST MRA NECK WITHOUT CONTRAST TECHNIQUE: Multiplanar, multiecho pulse sequences of the brain and surrounding structures were obtained without intravenous contrast. Angiographic images of the Circle of Willis were  obtained using MRA technique without intravenous contrast. Angiographic images of the neck were obtained using MRA technique without intravenous contrast. Carotid stenosis measurements (when applicable) are obtained utilizing NASCET criteria, using the distal internal carotid diameter as the denominator. COMPARISON:  Same-day noncontrast head CT FINDINGS: MRI HEAD FINDINGS Brain: There is no evidence of acute intracranial hemorrhage, extra-axial fluid collection, or acute infarct. There is mild parenchymal volume loss. The ventricles are normal in size. Patchy FLAIR signal abnormality in the subcortical and periventricular white matter is nonspecific but likely reflects sequela of chronic white matter microangiopathy. There is no solid mass lesion.  There is no midline shift. Vascular: Normal flow voids. Skull and upper cervical spine: There is no marrow signal abnormality.  Sinuses/Orbits: The paranasal sinuses are clear. The globes and orbits are unremarkable. Other: There is a large hematoma in the left forehead. MRA HEAD FINDINGS Anterior circulation: There is multifocal irregularity of the intracranial ICAs likely reflecting atherosclerotic disease. There is up to moderate stenosis bilaterally. There is a laterally directed outpouching arising from the left paraophthalmic segment suspicious for a small aneurysm measuring approximally 2 mm in depth (5-109). The bilateral MCAs are patent, without hemodynamically significant stenosis or occlusion. The bilateral ACAs are patent. The anterior communicating artery is patent. Posterior circulation: The bilateral V4 segments are patent. The basilar artery is patent. The bilateral PCAs are patent. The posterior communicating arteries are not definitely seen. Anatomic variants: None MRA NECK FINDINGS The aortic arch is not included within the field of view. The vessels of the neck are imaged from approximally the C6 level through the level of the dens. Right carotid system: The imaged right common, internal, and external carotid arteries are patent with antegrade flow, without hemodynamically significant stenosis or occlusion. There is no evidence of dissection. Left carotid system: The imaged left common, internal, and external carotid arteries are patent with antegrade flow, without hemodynamically significant stenosis or occlusion. There is no evidence of dissection. Vertebral arteries: The imaged vertebral arteries are patent with antegrade flow, without hemodynamically significant stenosis or occlusion. No evidence of dissection. IMPRESSION: MR HEAD: 1. No acute intracranial pathology. 2. Mild parenchymal volume loss and chronic white matter microangiopathy. 3. Large left forehead hematoma. MRA HEAD: 1. Atherosclerotic irregularity of the bilateral intracranial ICAs resulting in up to moderate stenosis bilaterally. No other  hemodynamically significant stenosis or occlusion in the intracranial vasculature. 2. 2 mm laterally directed outpouching arising from the ophthalmic segment of the left ICA suspicious for a small aneurysm. MRA NECK: 1. The aortic arch is not included within the field of view. The vessels of the neck are imaged from approximally the C6 level through the level of the dens. 2. Patent vasculature of the neck through the imaged portions, without evidence of hemodynamically significant stenosis, occlusion, or dissection. Electronically Signed   By: Valetta Mole M.D.   On: 08/02/2021 16:10   MR ANGIO NECK WO CONTRAST  Result Date: 08/02/2021 CLINICAL DATA:  Started feeling dizzy, weakness, fell and hit head EXAM: MRI HEAD WITHOUT CONTRAST MRA HEAD WITHOUT CONTRAST MRA NECK WITHOUT CONTRAST TECHNIQUE: Multiplanar, multiecho pulse sequences of the brain and surrounding structures were obtained without intravenous contrast. Angiographic images of the Circle of Willis were obtained using MRA technique without intravenous contrast. Angiographic images of the neck were obtained using MRA technique without intravenous contrast. Carotid stenosis measurements (when applicable) are obtained utilizing NASCET criteria, using the distal internal carotid diameter as the denominator. COMPARISON:  Same-day noncontrast head CT FINDINGS:  MRI HEAD FINDINGS Brain: There is no evidence of acute intracranial hemorrhage, extra-axial fluid collection, or acute infarct. There is mild parenchymal volume loss. The ventricles are normal in size. Patchy FLAIR signal abnormality in the subcortical and periventricular white matter is nonspecific but likely reflects sequela of chronic white matter microangiopathy. There is no solid mass lesion.  There is no midline shift. Vascular: Normal flow voids. Skull and upper cervical spine: There is no marrow signal abnormality. Sinuses/Orbits: The paranasal sinuses are clear. The globes and orbits are  unremarkable. Other: There is a large hematoma in the left forehead. MRA HEAD FINDINGS Anterior circulation: There is multifocal irregularity of the intracranial ICAs likely reflecting atherosclerotic disease. There is up to moderate stenosis bilaterally. There is a laterally directed outpouching arising from the left paraophthalmic segment suspicious for a small aneurysm measuring approximally 2 mm in depth (5-109). The bilateral MCAs are patent, without hemodynamically significant stenosis or occlusion. The bilateral ACAs are patent. The anterior communicating artery is patent. Posterior circulation: The bilateral V4 segments are patent. The basilar artery is patent. The bilateral PCAs are patent. The posterior communicating arteries are not definitely seen. Anatomic variants: None MRA NECK FINDINGS The aortic arch is not included within the field of view. The vessels of the neck are imaged from approximally the C6 level through the level of the dens. Right carotid system: The imaged right common, internal, and external carotid arteries are patent with antegrade flow, without hemodynamically significant stenosis or occlusion. There is no evidence of dissection. Left carotid system: The imaged left common, internal, and external carotid arteries are patent with antegrade flow, without hemodynamically significant stenosis or occlusion. There is no evidence of dissection. Vertebral arteries: The imaged vertebral arteries are patent with antegrade flow, without hemodynamically significant stenosis or occlusion. No evidence of dissection. IMPRESSION: MR HEAD: 1. No acute intracranial pathology. 2. Mild parenchymal volume loss and chronic white matter microangiopathy. 3. Large left forehead hematoma. MRA HEAD: 1. Atherosclerotic irregularity of the bilateral intracranial ICAs resulting in up to moderate stenosis bilaterally. No other hemodynamically significant stenosis or occlusion in the intracranial vasculature. 2. 2  mm laterally directed outpouching arising from the ophthalmic segment of the left ICA suspicious for a small aneurysm. MRA NECK: 1. The aortic arch is not included within the field of view. The vessels of the neck are imaged from approximally the C6 level through the level of the dens. 2. Patent vasculature of the neck through the imaged portions, without evidence of hemodynamically significant stenosis, occlusion, or dissection. Electronically Signed   By: Valetta Mole M.D.   On: 08/02/2021 16:10   MR BRAIN WO CONTRAST  Result Date: 08/02/2021 CLINICAL DATA:  Started feeling dizzy, weakness, fell and hit head EXAM: MRI HEAD WITHOUT CONTRAST MRA HEAD WITHOUT CONTRAST MRA NECK WITHOUT CONTRAST TECHNIQUE: Multiplanar, multiecho pulse sequences of the brain and surrounding structures were obtained without intravenous contrast. Angiographic images of the Circle of Willis were obtained using MRA technique without intravenous contrast. Angiographic images of the neck were obtained using MRA technique without intravenous contrast. Carotid stenosis measurements (when applicable) are obtained utilizing NASCET criteria, using the distal internal carotid diameter as the denominator. COMPARISON:  Same-day noncontrast head CT FINDINGS: MRI HEAD FINDINGS Brain: There is no evidence of acute intracranial hemorrhage, extra-axial fluid collection, or acute infarct. There is mild parenchymal volume loss. The ventricles are normal in size. Patchy FLAIR signal abnormality in the subcortical and periventricular white matter is nonspecific but likely reflects sequela  of chronic white matter microangiopathy. There is no solid mass lesion.  There is no midline shift. Vascular: Normal flow voids. Skull and upper cervical spine: There is no marrow signal abnormality. Sinuses/Orbits: The paranasal sinuses are clear. The globes and orbits are unremarkable. Other: There is a large hematoma in the left forehead. MRA HEAD FINDINGS Anterior  circulation: There is multifocal irregularity of the intracranial ICAs likely reflecting atherosclerotic disease. There is up to moderate stenosis bilaterally. There is a laterally directed outpouching arising from the left paraophthalmic segment suspicious for a small aneurysm measuring approximally 2 mm in depth (5-109). The bilateral MCAs are patent, without hemodynamically significant stenosis or occlusion. The bilateral ACAs are patent. The anterior communicating artery is patent. Posterior circulation: The bilateral V4 segments are patent. The basilar artery is patent. The bilateral PCAs are patent. The posterior communicating arteries are not definitely seen. Anatomic variants: None MRA NECK FINDINGS The aortic arch is not included within the field of view. The vessels of the neck are imaged from approximally the C6 level through the level of the dens. Right carotid system: The imaged right common, internal, and external carotid arteries are patent with antegrade flow, without hemodynamically significant stenosis or occlusion. There is no evidence of dissection. Left carotid system: The imaged left common, internal, and external carotid arteries are patent with antegrade flow, without hemodynamically significant stenosis or occlusion. There is no evidence of dissection. Vertebral arteries: The imaged vertebral arteries are patent with antegrade flow, without hemodynamically significant stenosis or occlusion. No evidence of dissection. IMPRESSION: MR HEAD: 1. No acute intracranial pathology. 2. Mild parenchymal volume loss and chronic white matter microangiopathy. 3. Large left forehead hematoma. MRA HEAD: 1. Atherosclerotic irregularity of the bilateral intracranial ICAs resulting in up to moderate stenosis bilaterally. No other hemodynamically significant stenosis or occlusion in the intracranial vasculature. 2. 2 mm laterally directed outpouching arising from the ophthalmic segment of the left ICA  suspicious for a small aneurysm. MRA NECK: 1. The aortic arch is not included within the field of view. The vessels of the neck are imaged from approximally the C6 level through the level of the dens. 2. Patent vasculature of the neck through the imaged portions, without evidence of hemodynamically significant stenosis, occlusion, or dissection. Electronically Signed   By: Valetta Mole M.D.   On: 08/02/2021 16:10   MR Cervical Spine Wo Contrast  Result Date: 08/02/2021 CLINICAL DATA:  Fall this morning. Dizziness with weakness. Ataxia. Cervical spine trauma. EXAM: MRI CERVICAL SPINE WITHOUT CONTRAST TECHNIQUE: Multiplanar, multisequence MR imaging of the cervical spine was performed. No intravenous contrast was administered. COMPARISON:  CT cervical spine 08/02/2021. No prior relevant studies. FINDINGS: Alignment: Straightening without focal angulation or significant listhesis. Vertebrae: Vertebral body heights are maintained, and no compression deformities are identified. There are mild endplate degenerative changes at C6-7 without suspicious marrow edema. The questioned small avulsion fracture of the inferior endplate of C6 on CT is difficult to evaluate by MRI, although there is possible mild prevertebral soft tissue swelling in this area. The facet joints appear intact. Cord: Normal in signal and caliber. Posterior Fossa, vertebral arteries, paraspinal tissues: Visualized portions of the posterior fossa appear unremarkable. As above, possible mild prevertebral soft tissue swelling from C5 through C7. In addition, there is inter spinous soft tissue edema from C3 through C6. Bilateral vertebral artery flow voids. A large heterogeneous right thyroid nodule is partially imaged, better seen on earlier cervical spine CT. Disc levels: C2-3: Normal interspace. C3-4: Mild  disc bulging and uncinate spurring. No cord deformity. Mild foraminal narrowing bilaterally. C4-5: Mild disc bulging and asymmetric uncinate  spurring on the right. No cord deformity. Mild to moderate right foraminal narrowing. C5-6: Loss of disc height with disc bulging and endplate osteophytes. The CSF surrounding the cord is partially effaced without cord deformity. Moderate osseous foraminal narrowing bilaterally. C6-7: Loss of disc height with disc bulging and endplate osteophytes. The CSF surrounding the cord is partially effaced without cord deformity. Moderate to severe right and moderate left foraminal narrowing. C7-T1: No significant disc space findings. IMPRESSION: 1. The MR findings are inconclusive regarding the questioned small avulsion fracture involving the anterior inferior endplate of C6. However, there is possible prevertebral soft tissue swelling as well as mild interspinous edema, findings suggesting hyperflexion injury. The facet joints appear intact, and there is no evidence of cord contusion. Recommend immobilization and radiographic follow-up. 2. Multilevel cervical spondylosis as described, greatest at C5-6 and C6-7 where there is associated osseous foraminal narrowing. 3. Complex right thyroid nodule measuring up to 3.7 cm on CT. Recommend thyroid US.(Ref: J Am Coll Radiol. 2015 Feb;12(2): 143-50). Electronically Signed   By: Richardean Sale M.D.   On: 08/02/2021 15:58   DG Chest Portable 1 View  Result Date: 08/02/2021 CLINICAL DATA:  Fall, altered mental status EXAM: PORTABLE CHEST 1 VIEW COMPARISON:  04/10/2021 FINDINGS: Unchanged cardiomediastinal silhouette. There is no new airspace disease. There is no large pleural effusion. No visible pneumothorax. Unchanged upper right mediastinal prominence due to enlarged thyroid, with leftward tracheal deviation. Left axillary surgical clips. There is no acute osseous abnormality. IMPRESSION: No evidence of acute cardiopulmonary disease. Electronically Signed   By: Maurine Simmering M.D.   On: 08/02/2021 14:33   US THYROID  Result Date: 08/04/2021 CLINICAL DATA:  Goiter. EXAM:  THYROID ULTRASOUND TECHNIQUE: Ultrasound examination of the thyroid gland and adjacent soft tissues was performed. COMPARISON:  None. FINDINGS: Parenchymal Echotexture: Mildly heterogenous Isthmus: 0.4 cm Right lobe: 5.1 x 2.7 x 2.7 cm Left lobe: 4.9 x 2.4 x 2.2 cm _________________________________________________________ Estimated total number of nodules >/= 1 cm: 3 Number of spongiform nodules >/=  2 cm not described below (TR1): 0 Number of mixed cystic and solid nodules >/= 1.5 cm not described below (TR2): 0 _________________________________________________________ Nodule # 1: Location: Right; Mid Maximum size: 2.5 cm; Other 2 dimensions: 1.9 x 1.8 cm Composition: solid/almost completely solid (2) Echogenicity: hypoechoic (2) Shape: not taller-than-wide (0) Margins: ill-defined (0) Echogenic foci: none (0) ACR TI-RADS total points: 4. ACR TI-RADS risk category: TR4 (4-6 points). ACR TI-RADS recommendations: **Given size (>/= 1.5 cm) and appearance, fine needle aspiration of this moderately suspicious nodule should be considered based on TI-RADS criteria. _________________________________________________________ Nodule # 2: Location: Right; Inferior Maximum size: 3.5 cm; Other 2 dimensions: 3.5 x 3.3 cm Composition: solid/almost completely solid (2) Echogenicity: hypoechoic (2) Shape: not taller-than-wide (0) Margins: ill-defined (0) Echogenic foci: none (0) ACR TI-RADS total points: 4. ACR TI-RADS risk category: TR4 (4-6 points). ACR TI-RADS recommendations: **Given size (>/= 1.5 cm) and appearance, fine needle aspiration of this moderately suspicious nodule should be considered based on TI-RADS criteria. _________________________________________________________ Nodule # 3: Location: Left; Inferior Maximum size: 2.4 cm; Other 2 dimensions: 2.1 x 2.0 cm Composition: solid/almost completely solid (2) Echogenicity: isoechoic (1) Shape: not taller-than-wide (0) Margins: smooth (0) Echogenic foci: none (0) ACR  TI-RADS total points: 3. ACR TI-RADS risk category: TR3 (3 points). ACR TI-RADS recommendations: *Given size (>/= 1.5 - 2.4 cm) and appearance,  a follow-up ultrasound in 1 year should be considered based on TI-RADS criteria. _________________________________________________________ IMPRESSION: 1. Heterogeneous and multi lobular thyroid gland most consistent with multinodular goiter. 2. TI-RADS category 4 nodules in the right mid and right inferior gland (labeled # 1 and # 2 above) meet criteria to consider fine-needle aspiration biopsy. 3. TI-RADS category 3 nodule in the left inferior gland (# 4) meets criteria for imaging surveillance. Recommend follow-up ultrasound in 1 year. The above is in keeping with the ACR TI-RADS recommendations - J Am Coll Radiol 2017;14:587-595. Electronically Signed   By: Jacqulynn Cadet M.D.   On: 08/04/2021 08:41   CT HEAD CODE STROKE WO CONTRAST  Result Date: 08/02/2021 CLINICAL DATA:  Code stroke. Neuro deficit, acute, stroke suspected left sided weakness, slurred speech EXAM: CT HEAD WITHOUT CONTRAST TECHNIQUE: Contiguous axial images were obtained from the base of the skull through the vertex without intravenous contrast. COMPARISON:  February 2022 FINDINGS: Brain: There is no acute intracranial hemorrhage, mass effect, or edema. No new loss of gray-white differentiation. Ventricles and sulci are stable in size and configuration. Patchy hypoattenuation in the supratentorial white matter is nonspecific but probably reflects stable chronic microvascular ischemic changes. No extra-axial collection. Vascular: No hyperdense vessel. There is intracranial atherosclerotic calcification at the skull base. Skull: Unremarkable. Sinuses/Orbits: No acute abnormality. Other: Left frontal scalp hematoma. ASPECTS ([place] Stroke Program Early CT Score) - Ganglionic level infarction (caudate, lentiform nuclei, internal capsule, insula, M1-M3 cortex): 7 - Supraganglionic infarction (M4-M6  cortex): 3 Total score (0-10 with 10 being normal): 10 IMPRESSION: There is no acute intracranial hemorrhage or evidence of acute infarction. ASPECT score is 10. These results were communicated to Dr. [name] at 2:11 pm on 08/02/2021 by text page via the [facility] messaging system. Electronically Signed   By: Macy Mis M.D.   On: 08/02/2021 14:14        Scheduled Meds:  carvedilol  6.25 mg Oral BID WC   clonazePAM  0.25 mg Oral BID   cyanocobalamin  1,000 mcg Intramuscular Once   gabapentin  300 mg Oral BID   insulin aspart  0-9 Units Subcutaneous TID WC   lacosamide  150 mg Oral BID   levETIRAcetam  500 mg Oral BID   And   levETIRAcetam  750 mg Oral QHS   sodium chloride flush  3 mL Intravenous Q12H   [START ON 08/05/2021] vitamin B-12  1,000 mcg Oral Daily   Continuous Infusions:  sodium chloride 75 mL/hr at 08/03/21 1802   cefTRIAXone (ROCEPHIN)  IV 1 g (08/03/21 1331)     LOS: 2 days    Time spent: 32 minutes    Sharen Hones, MD [facility]   To contact the attending provider between 7A-7P or the covering provider during after hours 7P-7A, please log into the web site [email or web address] and access using universal [place] password for that web site. If you do not have the password, please call the hospital operator.  08/04/2021, 1:00 PM

## 2021-08-04 NOTE — TOC Initial Note (Signed)
Transition of Care [facility]) - Initial/Assessment Note    Patient Details  Name: Anne Phillips MRN: [id number] Date of Birth: 05-Mar-1951  [facility] CM/SW Contact:    Harriet Masson, RN Phone Number:[phone] 08/04/2021, 4:40 PM  Clinical Narrative:                 Spoke with this pt today concerning recommendations for SNF. Pt receptive to all placement facilities. Pt states her sister lives with her for a pay rental fee and she still drives to all her medical appointments and obtains her medications at the local pharmacy. States her sisters helps with this task.  Will obtain PASRR and began bed search to the SNFs. No other issues to address at this time.  TOC will continue to follow up accordingly.  Expected Discharge Plan: Skilled Nursing Facility Barriers to Discharge: Continued Medical Work up   Patient Goals and CMS Choice     Choice offered to / list presented to : Patient  Expected Discharge Plan and Services Expected Discharge Plan: [place]   Discharge Planning Services: CM Consult   Living arrangements for the past 2 months: Single Family Home                                      Prior Living Arrangements/Services Living arrangements for the past 2 months: Single Family Home Lives with:: Siblings Patient language and need for interpreter reviewed:: Yes Do you feel safe going back to the place where you live?: Yes (After rehab completed)      Need for Family Participation in Patient Care: Yes (Comment) Care giver support system in place?: Yes (comment)   Criminal Activity/Legal Involvement Pertinent to Current Situation/Hospitalization: No - Comment as needed  Activities of Daily Living Home Assistive Devices/Equipment: None ADL Screening (condition at time of admission) Patient's cognitive ability adequate to safely complete daily activities?: Yes Is the patient deaf or have difficulty hearing?: No Does the  patient have difficulty seeing, even when wearing glasses/contacts?: No Does the patient have difficulty concentrating, remembering, or making decisions?: No Patient able to express need for assistance with ADLs?: Yes Does the patient have difficulty dressing or bathing?: No Independently performs ADLs?: Yes (appropriate for developmental age) Does the patient have difficulty walking or climbing stairs?: Yes Weakness of Legs: Both Weakness of Arms/Hands: Both  Permission Sought/Granted   Permission granted to share information with : Yes, Verbal Permission Granted              Emotional Assessment Appearance:: Appears older than stated age Attitude/Demeanor/Rapport: Engaged Affect (typically observed): Accepting Orientation: : Oriented to Self, Oriented to Place, Oriented to  Time, Oriented to Situation Alcohol / Substance Use: Not Applicable Psych Involvement: No (comment)  Admission diagnosis:  Thyroid nodule [E04.1] Weakness [R53.1] Traumatic hematoma of forehead, initial encounter [S00.83XA] Fall, initial encounter [W19.XXXA] Hyperextension injury of cervical spine, initial encounter [S19.80XA] AMS (altered mental status) [R41.82] Patient Active Problem List   Diagnosis Date Noted   Elevated troponin 08/03/2021   Hyponatremia 08/03/2021   AMS (altered mental status) 08/02/2021   Stage 3b chronic kidney disease ([place]) 07/08/2021   Carbon monoxide poisoning 10/03/2020   Fall at home, initial encounter 10/03/2020   Seizures ([place])    Hypertension    B12 deficiency anemia 06/29/2018   Goiter 06/05/2018   Falls frequently 12/21/2017   Invasive ductal carcinoma of breast,  female, left ([place]) 11/18/2017   Focal epilepsy with impairment of consciousness, intractable ([place]) 01/17/2016   Depression 08/27/2015   Acute delirium 08/27/2015   Depression, major, single episode, moderate (HCC)    (HFpEF) heart failure with preserved ejection fraction [facility])    Disorientation     Pyrexia    Hypoxia    Non-traumatic rhabdomyolysis    Coronary artery disease involving native coronary artery of native heart without angina pectoris    Sepsis ([place]) 08/20/2015   NSTEMI (non-ST elevated myocardial infarction) ([place]) 08/20/2015   Rhabdomyolysis 08/20/2015   PCP:  Pcp, No Pharmacy:   [id number] - Closed - [place], [place] - [address] [place] [address] Phone: [phone] Fax: [phone]  [facility] - [place], [place] - [address] [place] [address] Phone: [phone] Fax: [phone]     Social Determinants of Health (SDOH) Interventions    Readmission Risk Interventions No flowsheet data found.

## 2021-08-04 NOTE — Progress Notes (Signed)
PHARMACIST - PHYSICIAN COMMUNICATION  CONCERNING:  Enoxaparin (Lovenox) for DVT Prophylaxis    RECOMMENDATION: Patient was prescribed enoxaprin 40mg  q24 hours for VTE prophylaxis.   Filed Weights   08/02/21 1336 08/02/21 2100  Weight: 59 kg (130 lb) 57.4 kg (126 lb 8.7 oz)    Body mass index is 23.15 kg/m.  Estimated Creatinine Clearance: 23.7 mL/min (A) (by C-G formula based on SCr of 1.77 mg/dL (H)).   Patient is candidate for enoxaparin 30mg  every 24 hours based on CrCl <78ml/min or Weight <45kg  DESCRIPTION: Pharmacy has adjusted enoxaparin dose per [facility] policy.  Patient is now receiving enoxaparin 30mg  every 24 hours    Pernell Dupre, PharmD, BCPS Clinical Pharmacist 08/04/2021 1:14 PM

## 2021-08-05 DIAGNOSIS — R4182 Altered mental status, unspecified: Secondary | ICD-10-CM

## 2021-08-05 DIAGNOSIS — Z1330 Encounter for screening examination for mental health and behavioral disorders, unspecified: Secondary | ICD-10-CM

## 2021-08-05 DIAGNOSIS — S134XXA Sprain of ligaments of cervical spine, initial encounter: Secondary | ICD-10-CM

## 2021-08-05 LAB — URINE CULTURE: Culture: >=100000 — AB

## 2021-08-05 LAB — LEVETIRACETAM LEVEL: Levetiracetam Lvl: 50.8 ug/mL — ABNORMAL HIGH (ref 10.0–40.0)

## 2021-08-05 LAB — BASIC METABOLIC PANEL
Anion gap: 8 (ref 5–15)
BUN: 33 mg/dL — ABNORMAL HIGH (ref 8–23)
CO2: 19 mmol/L — ABNORMAL LOW (ref 22–32)
Calcium: 7.8 mg/dL — ABNORMAL LOW (ref 8.9–10.3)
Chloride: 110 mmol/L (ref 98–111)
Creatinine, Ser: 1.27 mg/dL — ABNORMAL HIGH (ref 0.44–1.00)
GFR, Estimated: 46 mL/min — ABNORMAL LOW (ref >60)
Glucose, Bld: 83 mg/dL (ref 70–99)
Potassium: 3.5 mmol/L (ref 3.5–5.1)
Sodium: 137 mmol/L (ref 135–145)

## 2021-08-05 LAB — GLUCOSE, CAPILLARY
Glucose-Capillary: 84 mg/dL (ref 70–99)
Glucose-Capillary: 96 mg/dL (ref 70–99)
Glucose-Capillary: 78 mg/dL (ref 70–99)
Glucose-Capillary: 87 mg/dL (ref 70–99)

## 2021-08-05 LAB — HEMOGLOBIN A1C
Hgb A1c MFr Bld: 5.2 % (ref 4.8–5.6)
Mean Plasma Glucose: 103 mg/dL

## 2021-08-05 MED ORDER — LOPERAMIDE HCL 2 MG PO CAPS
2.0000 mg | ORAL_CAPSULE | Freq: Three times a day (TID) | ORAL | Status: DC | PRN
  Administered 2021-08-05 – 2021-08-06 (×2): 2 mg via ORAL
  Filled 2021-08-05 (×2): qty 1

## 2021-08-05 MED ORDER — ENOXAPARIN SODIUM 40 MG/0.4ML IJ SOSY
40.0000 mg | PREFILLED_SYRINGE | INTRAMUSCULAR | Status: DC
  Administered 2021-08-05: 40 mg via SUBCUTANEOUS
  Filled 2021-08-05: qty 0.4

## 2021-08-05 MED ORDER — POTASSIUM CHLORIDE 20 MEQ PO PACK
40.0000 meq | PACK | Freq: Once | ORAL | Status: AC
  Administered 2021-08-05: 15:00:00 40 meq via ORAL
  Filled 2021-08-05: qty 2

## 2021-08-05 MED ORDER — CEPHALEXIN 500 MG PO CAPS
500.0000 mg | ORAL_CAPSULE | Freq: Three times a day (TID) | ORAL | Status: DC
  Administered 2021-08-05 – 2021-08-06 (×3): 500 mg via ORAL
  Filled 2021-08-05 (×3): qty 1

## 2021-08-05 NOTE — Progress Notes (Addendum)
PROGRESS NOTE    Anne Phillips  MVH:[id number] DOB: 1950/11/07 DOA: 08/02/2021 PCP: Pcp, No   Chief complaint.  Altered mental status. Brief Narrative:  Anne Phillips is a 70 y.o. female with history of failure with preserved ejection fraction, CAD, NSTEMI, seizure disorder, breast cancer, CKD, depression, hypertension, frequent falls, who presents after a fall with altered mental status. Cervical spine MRI showed neck concussion with soft tissue damage.  No fracture.  Brain MRI did not show any acute changes.  Neck MRI showed complex right thyroid nodule and a 3 cm. Patient developed significant hypotension in hospital while taking her home dose blood pressure medicines.  Most of blood pressure medicines discontinued, blood pressure is better.   Assessment & Plan:   Principal Problem:   AMS (altered mental status) Active Problems:   (HFpEF) heart failure with preserved ejection fraction (HCC)   Non-traumatic rhabdomyolysis   Coronary artery disease involving native coronary artery of native heart without angina pectoris   Depression   Seizures (HCC)   Hypertension   B12 deficiency anemia   Falls frequently   Invasive ductal carcinoma of breast, female, left (HCC)   Stage 3b chronic kidney disease (HCC)   Elevated troponin   Hyponatremia  Altered mental status secondary to brain concussion. Ground-level fall. Neck contusion with Hyperflex injury Ataxia. Patient mental status gradually improving.  Continue neck collar.  Klebsiella UTI. Currently on Rocephin, will change to cefepime for another 3 days.  Vitamin B12 deficient anemia. Continue oral vitamin B12, received injection yesterday.  Essential hypertension.   Hypotension. Polypharmacy. Patient hypotension and frequent fall appear to be secondary to overmedication with blood pressure medicines.  Most blood pressure medicine discontinued except Coreg 6.25 mg twice a day.  Blood pressure is better.  Continue to  follow.  Hyponatremia. Chronic kidney disease stage IIIb Renal functions better. Give her 40 mEq oral potassium for potassium 3.5.  Discontinue IV fluids.  Chronic diastolic congestive heart failure  Elevated troponin. No exacerbation of congestive heart failure after fluids.  Discontinue fluids.   Type 2 diabetes. Continue sliding scale insulin.  History of seizure disorder. Resume home medicines.  No seizure at this time.   Thyroid nodules Ultrasound showed multiple thyroid nodules. Patient will need to follow-up with ENT as outpatient to consider for fine-needle biopsy.   DVT prophylaxis: Lovenox Code Status: full Family Communication:  Disposition Plan:      Status is: Inpatient   Remains inpatient appropriate because: Severity of disease, unsafe discharge.       I/O last 3 completed shifts: In: 1895.2 [P.O.:280; I.V.:1515.2; IV Piggyback:100] Out: 350 [Urine:350] No intake/output data recorded.      Subjective: Patient noted to be sleepy this morning, but she does not have confusion.  She denies any short of breath or cough, No fever or chills. No dysuria hematuria No abdominal pain or nausea vomiting.   Objective: Vitals:   08/05/21 0500 08/05/21 0800 08/05/21 0807 08/05/21 1047  BP: 140/70 131/66 123/68 129/68  Pulse: 70 75 73 66  Resp: 14 14 20 14   Temp: 98.3 F (36.8 C) 98.2 F (36.8 C) 98.8 F (37.1 C) 97.7 F (36.5 C)  TempSrc: Oral Oral  Oral  SpO2: 98% 100% 99% 99%  Weight:      Height:        Intake/Output Summary (Last 24 hours) at 08/05/2021 1104 Last data filed at 08/05/2021 8413 Gross per 24 hour  Intake 1855.18 ml  Output 350 ml  Net  1505.18 ml   Filed Weights   08/02/21 1336 08/02/21 2100  Weight: 59 kg 57.4 kg    Examination:  General exam: Appears calm and comfortable  Respiratory system: Clear to auscultation. Respiratory effort normal. Cardiovascular system: S1 & S2 heard, RRR. No JVD, murmurs, rubs, gallops or  clicks. No pedal edema. Gastrointestinal system: Abdomen is nondistended, soft and nontender. No organomegaly or masses felt. Normal bowel sounds heard. Central nervous system: Drowsy and oriented x3. No focal neurological deficits. Extremities: Symmetric 5 x 5 power. Skin: No rashes, lesions or ulcers Psychiatry: Judgement and insight appear normal. Mood & affect appropriate.     Data Reviewed: I have personally reviewed following labs and imaging studies  CBC: Recent Labs  Lab 08/02/21 1433 08/03/21 0616  WBC 7.4 6.4  NEUTROABS 6.4  --   HGB 11.3* 10.2*  HCT 34.7* 30.7*  MCV 89.9 87.0  PLT 174 903   Basic Metabolic Panel: Recent Labs  Lab 08/02/21 1433 08/03/21 0616 08/04/21 0440 08/05/21 0433  NA 131* 133* 134* 137  K 3.5 3.7 3.7 3.5  CL 97* 99 104 110  CO2 23 22 21* 19*  GLUCOSE 98 96 89 83  BUN 29* 34* 40* 33*  CREATININE 1.52* 1.55* 1.77* 1.27*  CALCIUM 9.1 9.1 8.8* 7.8*  MG  --   --  1.7  --   PHOS  --   --  4.5  --    GFR: Estimated Creatinine Clearance: 33.1 mL/min (A) (by C-G formula based on SCr of 1.27 mg/dL (H)). Liver Function Tests: Recent Labs  Lab 08/02/21 1433  AST 19  ALT 9  ALKPHOS 130*  BILITOT 0.7  PROT 7.9  ALBUMIN 4.4   No results for input(s): LIPASE, AMYLASE in the last 168 hours. Recent Labs  Lab 08/04/21 0440  AMMONIA 23   Coagulation Profile: Recent Labs  Lab 08/02/21 1433  INR 1.2   Cardiac Enzymes: No results for input(s): CKTOTAL, CKMB, CKMBINDEX, TROPONINI in the last 168 hours. BNP (last 3 results) No results for input(s): PROBNP in the last 8760 hours. HbA1C: Recent Labs    08/02/21 1835  HGBA1C 5.2   CBG: Recent Labs  Lab 08/04/21 0742 08/04/21 1132 08/04/21 1700 08/04/21 2200 08/05/21 0808  GLUCAP 81 102* 94 134* 84   Lipid Profile: No results for input(s): CHOL, HDL, LDLCALC, TRIG, CHOLHDL, LDLDIRECT in the last 72 hours. Thyroid Function Tests: Recent Labs    08/03/21 0616  TSH 0.521    Anemia Panel: Recent Labs    08/03/21 0616  VITAMINB12 172*   Sepsis Labs: No results for input(s): PROCALCITON, LATICACIDVEN in the last 168 hours.  Recent Results (from the past 240 hour(s))  Urine Culture     Status: Abnormal   Collection Time: 08/02/21  4:22 PM   Specimen: Urine, Clean Catch  Result Value Ref Range Status   Specimen Description   Final    URINE, CLEAN CATCH Performed at [facility], [address], [place], [place] [address]    Special Requests   Final    NONE Performed at [facility], [address] [place], [place] [address]    Culture >=100,000 COLONIES/mL KLEBSIELLA PNEUMONIAE (A)  Final   Report Status 08/05/2021 FINAL  Final   Organism ID, Bacteria KLEBSIELLA PNEUMONIAE (A)  Final      Susceptibility   Klebsiella pneumoniae - MIC*    AMPICILLIN >=32 RESISTANT Resistant     CEFAZOLIN <=4 SENSITIVE Sensitive  CEFEPIME <=0.12 SENSITIVE Sensitive     CEFTRIAXONE <=0.25 SENSITIVE Sensitive     CIPROFLOXACIN <=0.25 SENSITIVE Sensitive     GENTAMICIN <=1 SENSITIVE Sensitive     IMIPENEM <=0.25 SENSITIVE Sensitive     NITROFURANTOIN 32 SENSITIVE Sensitive     TRIMETH/SULFA <=20 SENSITIVE Sensitive     AMPICILLIN/SULBACTAM 8 SENSITIVE Sensitive     PIP/TAZO <=4 SENSITIVE Sensitive     * >=100,000 COLONIES/mL KLEBSIELLA PNEUMONIAE  Resp Panel by RT-PCR (Flu A&B, Covid) Nasopharyngeal Swab     Status: None   Collection Time: 08/02/21  6:35 PM   Specimen: Nasopharyngeal Swab; Nasopharyngeal(NP) swabs in vial transport medium  Result Value Ref Range Status   SARS Coronavirus 2 by RT PCR NEGATIVE NEGATIVE Final    Comment: (NOTE) SARS-CoV-2 target nucleic acids are NOT DETECTED.  The SARS-CoV-2 RNA is generally detectable in upper respiratory specimens during the acute phase of infection. The lowest concentration of SARS-CoV-2 viral copies this assay can detect is 138 copies/mL. A negative result does not preclude  SARS-Cov-2 infection and should not be used as the sole basis for treatment or other patient management decisions. A negative result may occur with  improper specimen collection/handling, submission of specimen other than nasopharyngeal swab, presence of viral mutation(s) within the areas targeted by this assay, and inadequate number of viral copies(<138 copies/mL). A negative result must be combined with clinical observations, patient history, and epidemiological information. The expected result is Negative.  Fact Sheet for Patients:  EntrepreneurPulse.com.au  Fact Sheet for Healthcare Providers:  IncredibleEmployment.be  This test is no t yet approved or cleared by the [place] FDA and  has been authorized for detection and/or diagnosis of SARS-CoV-2 by FDA under an Emergency Use Authorization (EUA). This EUA will remain  in effect (meaning this test can be used) for the duration of the COVID-19 declaration under Section 564(b)(1) of the Act, 21 U.S.C.section 360bbb-3(b)(1), unless the authorization is terminated  or revoked sooner.       Influenza A by PCR NEGATIVE NEGATIVE Final   Influenza B by PCR NEGATIVE NEGATIVE Final    Comment: (NOTE) The Xpert Xpress SARS-CoV-2/FLU/RSV plus assay is intended as an aid in the diagnosis of influenza from Nasopharyngeal swab specimens and should not be used as a sole basis for treatment. Nasal washings and aspirates are unacceptable for Xpert Xpress SARS-CoV-2/FLU/RSV testing.  Fact Sheet for Patients: EntrepreneurPulse.com.au  Fact Sheet for Healthcare Providers: IncredibleEmployment.be  This test is not yet approved or cleared by the [place] FDA and has been authorized for detection and/or diagnosis of SARS-CoV-2 by FDA under an Emergency Use Authorization (EUA). This EUA will remain in effect (meaning this test can be used) for the duration of  the COVID-19 declaration under Section 564(b)(1) of the Act, 21 U.S.C. section 360bbb-3(b)(1), unless the authorization is terminated or revoked.  Performed at [facility], [address] [place], [place] [address]          Radiology Studies: US THYROID  Result Date: 08/04/2021 CLINICAL DATA:  Goiter. EXAM: THYROID ULTRASOUND TECHNIQUE: Ultrasound examination of the thyroid gland and adjacent soft tissues was performed. COMPARISON:  None. FINDINGS: Parenchymal Echotexture: Mildly heterogenous Isthmus: 0.4 cm Right lobe: 5.1 x 2.7 x 2.7 cm Left lobe: 4.9 x 2.4 x 2.2 cm _________________________________________________________ Estimated total number of nodules >/= 1 cm: 3 Number of spongiform nodules >/=  2 cm not described below (TR1): 0 Number of mixed cystic and solid nodules >/= 1.5 cm not  described below ([place]): 0 _________________________________________________________ Nodule # 1: Location: Right; Mid Maximum size: 2.5 cm; Other 2 dimensions: 1.9 x 1.8 cm Composition: solid/almost completely solid (2) Echogenicity: hypoechoic (2) Shape: not taller-than-wide (0) Margins: ill-defined (0) Echogenic foci: none (0) ACR TI-RADS total points: 4. ACR TI-RADS risk category: TR4 (4-6 points). ACR TI-RADS recommendations: **Given size (>/= 1.5 cm) and appearance, fine needle aspiration of this moderately suspicious nodule should be considered based on TI-RADS criteria. _________________________________________________________ Nodule # 2: Location: Right; Inferior Maximum size: 3.5 cm; Other 2 dimensions: 3.5 x 3.3 cm Composition: solid/almost completely solid (2) Echogenicity: hypoechoic (2) Shape: not taller-than-wide (0) Margins: ill-defined (0) Echogenic foci: none (0) ACR TI-RADS total points: 4. ACR TI-RADS risk category: TR4 (4-6 points). ACR TI-RADS recommendations: **Given size (>/= 1.5 cm) and appearance, fine needle aspiration of this moderately suspicious nodule should be considered  based on TI-RADS criteria. _________________________________________________________ Nodule # 3: Location: Left; Inferior Maximum size: 2.4 cm; Other 2 dimensions: 2.1 x 2.0 cm Composition: solid/almost completely solid (2) Echogenicity: isoechoic (1) Shape: not taller-than-wide (0) Margins: smooth (0) Echogenic foci: none (0) ACR TI-RADS total points: 3. ACR TI-RADS risk category: TR3 (3 points). ACR TI-RADS recommendations: *Given size (>/= 1.5 - 2.4 cm) and appearance, a follow-up ultrasound in 1 year should be considered based on TI-RADS criteria. _________________________________________________________ IMPRESSION: 1. Heterogeneous and multi lobular thyroid gland most consistent with multinodular goiter. 2. TI-RADS category 4 nodules in the right mid and right inferior gland (labeled # 1 and # 2 above) meet criteria to consider fine-needle aspiration biopsy. 3. TI-RADS category 3 nodule in the left inferior gland (# 4) meets criteria for imaging surveillance. Recommend follow-up ultrasound in 1 year. The above is in keeping with the ACR TI-RADS recommendations - J Am Coll Radiol 2017;14:587-595. Electronically Signed   By: Jacqulynn Cadet M.D.   On: 08/04/2021 08:41        Scheduled Meds:  carvedilol  6.25 mg Oral BID WC   clonazePAM  0.25 mg Oral BID   enoxaparin (LOVENOX) injection  30 mg Subcutaneous Q24H   gabapentin  300 mg Oral BID   insulin aspart  0-9 Units Subcutaneous TID WC   lacosamide  150 mg Oral BID   levETIRAcetam  500 mg Oral BID   And   levETIRAcetam  750 mg Oral QHS   sodium chloride flush  3 mL Intravenous Q12H   vitamin B-12  1,000 mcg Oral Daily   Continuous Infusions:  sodium chloride 50 mL/hr at 08/05/21 1660   cefTRIAXone (ROCEPHIN)  IV Stopped (08/04/21 1342)     LOS: 3 days    Time spent: 27 minutes    Sharen Hones, MD [facility]   To contact the attending provider between 7A-7P or the covering provider during after hours 7P-7A, please log  into the web site [email or web address] and access using universal [place] password for that web site. If you do not have the password, please call the hospital operator.  08/05/2021, 11:04 AM

## 2021-08-05 NOTE — Consult Note (Signed)
[facility]iatry Consult   Reason for Consult:  evaluate for "capacity" Referring Physician:  Roosevelt Locks Patient Identification: Anne Phillips MRN:  [id number] Principal Diagnosis: AMS (altered mental status) Diagnosis:  Principal Problem:   AMS (altered mental status) Active Problems:   (HFpEF) heart failure with preserved ejection fraction ([place])   Non-traumatic rhabdomyolysis   Coronary artery disease involving native coronary artery of native heart without angina pectoris   Depression   Seizures ([place])   Hypertension   B12 deficiency anemia   Falls frequently   Invasive ductal carcinoma of breast, female, left ([place])   Stage 3b chronic kidney disease (HCC)   Elevated troponin   Hyponatremia   Injury of neck, whiplash   Total Time spent with patient: 1 hour  Subjective:   Anne ABBETT is a 70 y.o. female patient admitted with AMS.Anne Phillips  HPI:  Patient was seen and chart reviewed. Patient is alert to self, place, approximate time, year and situation. She states current president correctly, although says "That's the first time I got it right in awhile."  Patient has blackened, swollen eyes, left greater than right.  She states she is having trouble seeing with her swollen eyes.  Writer introduced herself and stated that we are here to see how we could help her.  Patient states that she wants to go home, but was told she needs to go to rehabilitation facility.  Writer explained to patient that she does need to go to a rehabilitation facility and that we and her sisters are very concerned about her falls.  Patient says that she is "stubborn" and wants to go home.  Patient has soiled herself while we were talking and tried to get up out of bed.  We called for the nurse and she was assisted to the bathroom.  Patient is very unsteady on her feet.  Writer talked to patient's sister, Anne Phillips, who states that patient lives with their other 60 year old sister who cannot take care of patient.   Anne Phillips says the house is "a mess."  Anne Phillips also says that patient has seizures and drives.  Patient admits that she does drive, "but not very much."  Patient denies thoughts of harm to herself.  At the end of the visit she does state that she will agree to go to rehab facility, as she realizes she is unable to walk out of the hospital by herself and she will not receive the assistance of staff or her family.  Sister Anne Phillips agrees that she oesa also states that she believes Education officer, museum has found placement for patient.  Discussed with Dr. Roosevelt Locks.  Patient appears to have capacity to make her decisions, although it would not be advisable for her to go home.  Family agrees with this and will not be picking her up. Anne Phillips other sister will not be picking her up from the hospital.    Past Psychiatric History: Unknown  Risk to Self:   Risk to Others:   Prior Inpatient Therapy:   Prior Outpatient Therapy:    Past Medical History:  Past Medical History:  Diagnosis Date   Cancer (Anne Phillips)    Coronary artery disease    Hyperlipidemia    Hypertension    Seizures ([place])    Thyroid disease     Past Surgical History:  Procedure Laterality Date   MASTECTOMY     Family History:  Family History  Problem Relation Age of Onset   Hypertension Other    Family Psychiatric  History: Unknown Social History:  Social History   Substance and Sexual Activity  Alcohol Use No     Social History   Substance and Sexual Activity  Drug Use Never    Social History   Socioeconomic History   Marital status: Single    Spouse name: Not on file   Number of children: Not on file   Years of education: Not on file   Highest education level: Not on file  Occupational History   Not on file  Tobacco Use   Smoking status: Every Day    Packs/day: 1.00    Types: Cigarettes   Smokeless tobacco: Never  Vaping Use   Vaping Use: Never used  Substance and Sexual Activity   Alcohol use: No   Drug use: Never    Sexual activity: Not on file  Other Topics Concern   Not on file  Social History Narrative   Not on file   Social Determinants of Health   Financial Resource Strain: Not on file  Food Insecurity: Not on file  Transportation Needs: Not on file  Physical Activity: Not on file  Stress: Not on file  Social Connections: Not on file   Additional Social History:    Allergies:   Allergies  Allergen Reactions   Iodinated Diagnostic Agents Hives and Other (See Comments)    Rash     Labs:  Results for orders placed or performed during the hospital encounter of 08/02/21 (from the past 48 hour(s))  Glucose, capillary     Status: Abnormal   Collection Time: 08/03/21  6:11 PM  Result Value Ref Range   Glucose-Capillary 107 (H) 70 - 99 mg/dL    Comment: Glucose reference range applies only to samples taken after fasting for at least 8 hours.  Cortisol-am, blood     Status: Abnormal   Collection Time: 08/03/21  6:50 PM  Result Value Ref Range   Cortisol - AM 52.5 (H) 6.7 - 22.6 ug/dL    Comment: Performed at [facility], [place] [address], [place], [place] [address]  Glucose, capillary     Status: Abnormal   Collection Time: 08/03/21  8:40 PM  Result Value Ref Range   Glucose-Capillary 130 (H) 70 - 99 mg/dL    Comment: Glucose reference range applies only to samples taken after fasting for at least 8 hours.  Basic metabolic panel     Status: Abnormal   Collection Time: 08/04/21  4:40 AM  Result Value Ref Range   Sodium 134 (L) 135 - 145 mmol/L   Potassium 3.7 3.5 - 5.1 mmol/L   Chloride 104 98 - 111 mmol/L   CO2 21 (L) 22 - 32 mmol/L   Glucose, Bld 89 70 - 99 mg/dL    Comment: Glucose reference range applies only to samples taken after fasting for at least 8 hours.   BUN 40 (H) 8 - 23 mg/dL   Creatinine, Ser 1.77 (H) 0.44 - 1.00 mg/dL   Calcium 8.8 (L) 8.9 - 10.3 mg/dL   GFR, Estimated 31 (L) >60 mL/min    Comment: (NOTE) Calculated using the CKD-EPI Creatinine Equation  (2021)    Anion gap 9 5 - 15    Comment: Performed at [facility], [address] [place], [place] [address]  Magnesium     Status: None   Collection Time: 08/04/21  4:40 AM  Result Value Ref Range   Magnesium 1.7 1.7 - 2.4 mg/dL    Comment: Performed at  [facility], [address] [place], [place] [address]  Phosphorus     Status: None   Collection Time: 08/04/21  4:40 AM  Result Value Ref Range   Phosphorus 4.5 2.5 - 4.6 mg/dL    Comment: Performed at [facility], [address] [place], [place] [address]  Ammonia     Status: None   Collection Time: 08/04/21  4:40 AM  Result Value Ref Range   Ammonia 23 9 - 35 umol/L    Comment: Performed at [facility], [address] [place], [place] [address]  Glucose, capillary     Status: None   Collection Time: 08/04/21  7:42 AM  Result Value Ref Range   Glucose-Capillary 81 70 - 99 mg/dL    Comment: Glucose reference range applies only to samples taken after fasting for at least 8 hours.  Glucose, capillary     Status: Abnormal   Collection Time: 08/04/21 11:32 AM  Result Value Ref Range   Glucose-Capillary 102 (H) 70 - 99 mg/dL    Comment: Glucose reference range applies only to samples taken after fasting for at least 8 hours.  Glucose, capillary     Status: None   Collection Time: 08/04/21  5:00 PM  Result Value Ref Range   Glucose-Capillary 94 70 - 99 mg/dL    Comment: Glucose reference range applies only to samples taken after fasting for at least 8 hours.  Glucose, capillary     Status: Abnormal   Collection Time: 08/04/21 10:00 PM  Result Value Ref Range   Glucose-Capillary 134 (H) 70 - 99 mg/dL    Comment: Glucose reference range applies only to samples taken after fasting for at least 8 hours.  Basic metabolic panel     Status: Abnormal   Collection Time: 08/05/21  4:33 AM  Result Value Ref Range   Sodium 137 135 - 145 mmol/L   Potassium 3.5 3.5 - 5.1 mmol/L   Chloride 110  98 - 111 mmol/L   CO2 19 (L) 22 - 32 mmol/L   Glucose, Bld 83 70 - 99 mg/dL    Comment: Glucose reference range applies only to samples taken after fasting for at least 8 hours.   BUN 33 (H) 8 - 23 mg/dL   Creatinine, Ser 1.27 (H) 0.44 - 1.00 mg/dL   Calcium 7.8 (L) 8.9 - 10.3 mg/dL   GFR, Estimated 46 (L) >60 mL/min    Comment: (NOTE) Calculated using the CKD-EPI Creatinine Equation (2021)    Anion gap 8 5 - 15    Comment: Performed at [facility], [address] [place], [place] [address]  Glucose, capillary     Status: None   Collection Time: 08/05/21  8:08 AM  Result Value Ref Range   Glucose-Capillary 84 70 - 99 mg/dL    Comment: Glucose reference range applies only to samples taken after fasting for at least 8 hours.  Glucose, capillary     Status: None   Collection Time: 08/05/21 11:48 AM  Result Value Ref Range   Glucose-Capillary 96 70 - 99 mg/dL    Comment: Glucose reference range applies only to samples taken after fasting for at least 8 hours.  Glucose, capillary     Status: None   Collection Time: 08/05/21  4:34 PM  Result Value Ref Range   Glucose-Capillary 78 70 - 99 mg/dL    Comment: Glucose reference range applies only to samples taken after fasting for at least 8 hours.  Current Facility-Administered Medications  Medication Dose Route Frequency Provider Last Rate Last Admin   acetaminophen (TYLENOL) tablet 650 mg  650 mg Oral Q6H PRN Clarnce Flock, MD   650 mg at 08/03/21 2131   Or   acetaminophen (TYLENOL) suppository 650 mg  650 mg Rectal Q6H PRN Clarnce Flock, MD       albuterol (PROVENTIL) (2.5 MG/3ML) 0.083% nebulizer solution 2.5 mg  2.5 mg Nebulization Q6H PRN Clarnce Flock, MD       carvedilol (COREG) tablet 6.25 mg  6.25 mg Oral BID WC Sharen Hones, MD   6.25 mg at 08/05/21 0920   cephALEXin (KEFLEX) capsule 500 mg  500 mg Oral Q8H Sharen Hones, MD   500 mg at 08/05/21 1433   clonazePAM (KLONOPIN) disintegrating tablet 0.25  mg  0.25 mg Oral BID Sharen Hones, MD   0.25 mg at 08/05/21 0919   enoxaparin (LOVENOX) injection 40 mg  40 mg Subcutaneous Q24H Vira Blanco, RPH       gabapentin (NEURONTIN) capsule 300 mg  300 mg Oral BID Clarnce Flock, MD   300 mg at 08/05/21 0919   insulin aspart (novoLOG) injection 0-9 Units  0-9 Units Subcutaneous TID WC Clarnce Flock, MD       lacosamide (VIMPAT) tablet 150 mg  150 mg Oral BID Clarnce Flock, MD   150 mg at 08/05/21 0919   levETIRAcetam (KEPPRA) tablet 500 mg  500 mg Oral BID Clarnce Flock, MD   500 mg at 08/05/21 1433   And   levETIRAcetam (KEPPRA) tablet 750 mg  750 mg Oral QHS Clarnce Flock, MD   750 mg at 08/04/21 2140   ondansetron (ZOFRAN) tablet 4 mg  4 mg Oral Q6H PRN Clarnce Flock, MD       Or   ondansetron [facility]) injection 4 mg  4 mg Intravenous Q6H PRN Clarnce Flock, MD       oxyCODONE (Oxy IR/ROXICODONE) immediate release tablet 5 mg  5 mg Oral Q4H PRN Clarnce Flock, MD       polyethylene glycol (MIRALAX / GLYCOLAX) packet 17 g  17 g Oral Daily PRN Clarnce Flock, MD       sodium chloride flush (NS) 0.9 % injection 3 mL  3 mL Intravenous Q12H Clarnce Flock, MD   3 mL at 08/05/21 1610   traZODone (DESYREL) tablet 25 mg  25 mg Oral QHS PRN Clarnce Flock, MD   25 mg at 08/04/21 2139   vitamin B-12 (CYANOCOBALAMIN) tablet 1,000 mcg  1,000 mcg Oral Daily Sharen Hones, MD   1,000 mcg at 08/05/21 0920    Musculoskeletal: Strength & Muscle Tone: decreased Gait & Station: unsteady Patient leans: N/A            Psychiatric Specialty Exam:  Presentation  General Appearance: Appropriate for Environment Eye Contact:Fair Speech:Clear and Coherent Speech Volume:Normal Handedness:No data recorded  Mood and Affect  Mood:Euthymic Affect:Congruent  Thought Process  Thought Processes:Coherent Descriptions of Associations:Intact Orientation:Full (Time, Place and Person) Thought  Content:Logical History of Schizophrenia/Schizoaffective disorder:No data recorded Duration of Psychotic Symptoms:No data recorded Hallucinations:Hallucinations: None Ideas of Reference:None Suicidal Thoughts:Suicidal Thoughts: No Homicidal Thoughts:Homicidal Thoughts: No  Sensorium  Memory:Immediate Good Judgment:Fair Insight:Poor  Executive Functions  Concentration:Fair Attention Span:Fair [place]  Psychomotor Activity  Psychomotor Activity:Psychomotor Activity: Normal  Assets  Assets:Communication Skills; Resilience; Social Support  Sleep  Sleep:Sleep: Fair  Physical Exam: Physical  Exam Vitals and nursing note reviewed.  HENT:     Head: Normocephalic.     Nose: No congestion or rhinorrhea.  Eyes:     General:        Right eye: No discharge.        Left eye: No discharge.     Comments: Eyes blackened and swollen, L>R.    Cardiovascular:     Rate and Rhythm: Normal rate.  Pulmonary:     Effort: Pulmonary effort is normal.  Musculoskeletal:        General: Normal range of motion.     Cervical back: Normal range of motion.  Skin:    General: Skin is dry.  Neurological:     Mental Status: She is alert and oriented to person, place, and time.  Psychiatric:        Attention and Perception: Attention normal.        Mood and Affect: Mood normal.        Speech: Speech normal.        Behavior: Behavior normal.        Thought Content: Thought content normal.        Cognition and Memory: Cognition normal.        Judgment: Judgment is impulsive.   Review of Systems  Reason unable to perform ROS: pt being casred for on medical unit for fall.  Eyes:        Swelling, bruising from fall  Skin:        Facial brusing noted  Psychiatric/Behavioral:  Negative for depression, hallucinations, memory loss, substance abuse and suicidal ideas. The patient is not nervous/anxious and does not have insomnia.        Denies psychiatric  sx   Blood pressure 138/90, pulse 73, temperature 98.3 F (36.8 C), temperature source Oral, resp. rate 15, height 5\' 2"  (1.575 m), weight 57.4 kg, SpO2 100 %. Body mass index is 23.15 kg/m.  Treatment Plan Summary: Plan  No indication for psychiatric inpatient hospitalization.Appears that she h as capacity for decision making at this  time, but it is not advisable for her to go home d/t her hx of falling.    Disposition: No evidence of imminent risk to self or others at present.   Supportive therapy provided about ongoing stressors.  Sherlon Handing, NP 08/05/2021 5:54 PM

## 2021-08-05 NOTE — Progress Notes (Addendum)
Pt is sleeping, breakfast was delivered but pt did not want to eat after being asked twice. Pt was asked if she wanted bath, refused and said she wanted to wait until later.

## 2021-08-05 NOTE — Progress Notes (Signed)
PT Cancellation Note  Patient Details Name: Anne Phillips MRN: [id number] DOB: [birth year]   Cancelled Treatment:    Reason Eval/Treat Not Completed: Patient at procedure or test/unavailable  Pt off unit on attempt.  Will attempt again at a later time/date.   Chesley Noon 08/05/2021, 2:42 PM

## 2021-08-05 NOTE — TOC Progression Note (Signed)
Transition of Care [facility]) - Progression Note    Patient Details  Name: Anne Phillips MRN: [id number] Date of Birth: Sep 01, 1951  [facility]) CM/SW Remy, RN Phone Number: 08/05/2021, 3:52 PM  Clinical Narrative: Spoke with patient about bed offer at [facility] at [place]. Explained to patient that it was short-term and only 11 miles away. Patient agreed, called sister Anne Phillips who also was receptive. Anne Phillips place notified, Authorization started.     Expected Discharge Plan: [place] Barriers to Discharge: Continued Medical Work up  Expected Discharge Plan and Services Expected Discharge Plan: [place]   Discharge Planning Services: CM Consult   Living arrangements for the past 2 months: Single Family Home                                       Social Determinants of Health (SDOH) Interventions    Readmission Risk Interventions No flowsheet data found.

## 2021-08-05 NOTE — Progress Notes (Signed)
Patient up from bed standing in the door way stating that she is going home and that her sister is here to get her.  I spoke with her sister on the phone and explained that patient has not been discharged, we are looking for a rehab bed for her.  Her sister states understanding and asked to speak with patient and let her know that she needs to stay in the hospital for placement.  Patient refuses and states that she is leaving.  Dr Roosevelt Locks in room and spoke with patient about her need for rehab r/t frequent falls with injury.  Patient redirected to bed with meal tray in front of her and her phone in hand.  Patient educated to call if she needs to get oob for safety and wearing nonskid socks.  Call bell within reach and bed alarm on, bed in lowest position.  Will continue to monitor.

## 2021-08-05 NOTE — Progress Notes (Signed)
PHARMACIST - PHYSICIAN COMMUNICATION  CONCERNING:  Enoxaparin (Lovenox) for DVT Prophylaxis    RECOMMENDATION: Patient was prescribed enoxaprin 40mg  q24 hours for VTE prophylaxis.   Filed Weights   08/02/21 1336 08/02/21 2100  Weight: 59 kg (130 lb) 57.4 kg (126 lb 8.7 oz)    Body mass index is 23.15 kg/m.  Estimated Creatinine Clearance: 33.1 mL/min (A) (by C-G formula based on SCr of 1.27 mg/dL (H)).  Patient has improved CrCl is candidate for enoxaparin 40mg  every 24 hours based on CrCl >51ml/min or Weight >45kg  DESCRIPTION: Pharmacy has adjusted enoxaparin dose per [facility] policy.  Patient is now receiving enoxaparin 40 mg every 24 hours    Vira Blanco, PharmD Clinical Pharmacist  08/05/2021 12:00 PM

## 2021-08-05 NOTE — Progress Notes (Signed)
New order received to d/c IV fluids.  Upon entry into patient's room, patient had pulled her IV out of her right hand.  No bleeding or other complications noted.  MD notified, patient is waiting on SNF placement and per MD via secure chat, patient can remain without IV access.  Will continue to monitor.

## 2021-08-06 DIAGNOSIS — S134XXA Sprain of ligaments of cervical spine, initial encounter: Secondary | ICD-10-CM

## 2021-08-06 LAB — GLUCOSE, CAPILLARY
Glucose-Capillary: 85 mg/dL (ref 70–99)
Glucose-Capillary: 98 mg/dL (ref 70–99)

## 2021-08-06 LAB — RESP PANEL BY RT-PCR (FLU A&B, COVID) ARPGX2
Influenza A by PCR: NEGATIVE
Influenza B by PCR: NEGATIVE
SARS Coronavirus 2 by RT PCR: NEGATIVE

## 2021-08-06 MED ORDER — CARVEDILOL 6.25 MG PO TABS: 6.2500 mg | ORAL_TABLET | Freq: Two times a day (BID) | ORAL | Status: DC

## 2021-08-06 MED ORDER — CLONAZEPAM 0.25 MG PO TBDP: 0.2500 mg | ORAL_TABLET | Freq: Two times a day (BID) | ORAL | 0 refills | Status: DC

## 2021-08-06 MED ORDER — LACOSAMIDE 150 MG PO TABS: 150.0000 mg | ORAL_TABLET | Freq: Two times a day (BID) | ORAL | 0 refills | Status: DC

## 2021-08-06 MED ORDER — CYANOCOBALAMIN 1000 MCG PO TABS: 1000.0000 ug | ORAL_TABLET | Freq: Every day | ORAL | Status: DC

## 2021-08-06 NOTE — Care Management Important Message (Signed)
Important Message  Patient Details  Name: Anne Phillips MRN: [id number] Date of Birth: 1951-08-04   Medicare Important Message Given:  Yes     Juliann Pulse A Samia Kukla 08/06/2021, 10:41 AM

## 2021-08-06 NOTE — Progress Notes (Signed)
Patient is being discharged to [facility]. Called report and spoke to [place]. Patient will be transported via EMS.

## 2021-08-06 NOTE — Discharge Summary (Signed)
Physician Discharge Summary  Patient ID: Anne Phillips MRN: [id number] DOB/AGE: 1951-04-09 70 y.o.  Admit date: 08/02/2021 Discharge date: 08/06/2021  Admission Diagnoses:  Discharge Diagnoses:  Principal Problem:   AMS (altered mental status) Active Problems:   (HFpEF) heart failure with preserved ejection fraction (HCC)   Non-traumatic rhabdomyolysis   Coronary artery disease involving native coronary artery of native heart without angina pectoris   Depression   Seizures (HCC)   Hypertension   B12 deficiency anemia   Falls frequently   Invasive ductal carcinoma of breast, female, left (HCC)   Stage 3b chronic kidney disease (HCC)   Elevated troponin   Hyponatremia   Injury of neck, whiplash   Discharged Condition: fair  Hospital Course:   Anne Phillips is a 70 y.o. female with history of failure with preserved ejection fraction, CAD, NSTEMI, seizure disorder, breast cancer, CKD, depression, hypertension, frequent falls, who presents after a fall with altered mental status. Cervical spine MRI showed neck concussion with soft tissue damage.  No fracture.  Brain MRI did not show any acute changes.  Neck MRI showed complex right thyroid nodule and a 3 cm. Patient developed significant hypotension in hospital while taking her home dose blood pressure medicines.  Most of blood pressure medicines discontinued, blood pressure is better.  Altered mental status secondary to brain concussion. Ground-level fall. Neck contusion with Hyperflex injury Ataxia. Patient mental status gradually improving.  Continue neck collar. Patient will be seen by neurosurgery in about 10 days.    Klebsiella UTI. Patient has completed a course of antibiotics, discontinue.  Vitamin B12 deficient anemia. Continue oral vitamin B12, received injection x1.   Essential hypertension.   Hypotension. Polypharmacy. Patient hypotension and frequent fall appear to be secondary to overmedication with  blood pressure medicines.  Most blood pressure medicine discontinued except Coreg 6.25 mg twice a day.  Blood pressure is better.  Continue to follow.   Hyponatremia. Chronic kidney disease stage IIIb Renal functions better.    Chronic diastolic congestive heart failure  Elevated troponin. No exacerbation of congestive heart failure after fluids.     Type 2 diabetes. Resume home regimen.  History of seizure disorder. Resume home medicines.  No seizure at this time.   Thyroid nodules Ultrasound showed multiple thyroid nodules. Patient will need to follow-up with ENT as outpatient to consider for fine-needle biopsy.  I placed instruction to follow with Dr. Pryor Ochoa.  Patient has been seen by psychiatry, she has the capacity to make her own decision  Consults: None  Significant Diagnostic Studies:   Treatments: antibiotics, symptomatic tx  Discharge Exam: Blood pressure 132/67, pulse 60, temperature 98 F (36.7 C), resp. rate 18, height 5\' 2"  (1.575 m), weight 57.4 kg, SpO2 98 %. General appearance: alert and cooperative Resp: clear to auscultation bilaterally Cardio: regular rate and rhythm, S1, S2 normal, no murmur, click, rub or gallop GI: soft, non-tender; bowel sounds normal; no masses,  no organomegaly Extremities: extremities normal, atraumatic, no cyanosis or edema  Disposition: Discharge disposition: 03-Skilled Nursing Facility       Discharge Instructions     Diet - low sodium heart healthy   Complete by: As directed    Increase activity slowly   Complete by: As directed       Allergies as of 08/06/2021       Reactions   Iodinated Diagnostic Agents Hives, Other (See Comments)   Rash         Medication List  STOP taking these medications    amLODipine 10 MG tablet Commonly known as: NORVASC   clonazePAM 1 MG tablet Commonly known as: KLONOPIN Replaced by: clonazePAM 0.25 MG disintegrating tablet   furosemide 40 MG tablet Commonly  known as: LASIX   losartan 100 MG tablet Commonly known as: COZAAR   spironolactone 25 MG tablet Commonly known as: ALDACTONE       TAKE these medications    acetaminophen 325 MG tablet Commonly known as: TYLENOL Take 2 tablets (650 mg total) by mouth every 6 (six) hours as needed for mild pain (or Fever >/= 101).   albuterol 108 (90 Base) MCG/ACT inhaler Commonly known as: VENTOLIN HFA Inhale 1-2 puffs into the lungs every 6 (six) hours as needed for shortness of breath or wheezing.   atorvastatin 40 MG tablet Commonly known as: LIPITOR Take 40 mg by mouth daily.   carvedilol 6.25 MG tablet Commonly known as: COREG Take 1 tablet (6.25 mg total) by mouth 2 (two) times daily with a meal. What changed:  medication strength how much to take   clonazePAM 0.25 MG disintegrating tablet Commonly known as: KLONOPIN Take 1 tablet (0.25 mg total) by mouth 2 (two) times daily. Replaces: clonazePAM 1 MG tablet   cyanocobalamin 1000 MCG tablet Take 1 tablet (1,000 mcg total) by mouth daily.   empagliflozin 10 MG Tabs tablet Commonly known as: JARDIANCE Take 10 mg by mouth daily.   gabapentin 300 MG capsule Commonly known as: NEURONTIN Take 1 capsule (300 mg total) by mouth 2 (two) times daily.   levETIRAcetam 500 MG tablet Commonly known as: KEPPRA Take 500-750 mg by mouth See admin instructions. Take 1 tablet (500mg ) by mouth every morning, take 1 tablet (500mg ) by mouth at midday and take 1 tablets (750mg ) by mouth every night at bedtime   Vimpat 150 MG Tabs Generic drug: Lacosamide Take 150 mg by mouth in the morning and at bedtime.        Contact information for follow-up providers     Meade Maw, MD Follow up.   Specialty: Neurosurgery Contact information: [place] [address] [phone]              Contact information for after-discharge care     Destination     [facility] Preferred SNF .   Service: Skilled  Nursing Contact information: [address] [place] [phone]                    34 minutes Signed: Sharen Hones 08/06/2021, 9:41 AM

## 2021-08-06 NOTE — TOC Progression Note (Signed)
Transition of Care [facility]) - Progression Note    Patient Details  Name: Anne Phillips MRN: [id number] Date of Birth: May 12, 1951  Transition of Care [facility]) CM/SW [place], LCSW Phone Number: 08/06/2021, 9:26 AM  Clinical Narrative:  Insurance authorization approved: [id number]. Valid 12/5-12/7. Left message for [facility] admissions coordinator to notify and see if they can accept her today if stable.    Expected Discharge Plan: [place] Barriers to Discharge: Continued Medical Work up  Expected Discharge Plan and Services Expected Discharge Plan: [place]   Discharge Planning Services: CM Consult   Living arrangements for the past 2 months: Single Family Home                                       Social Determinants of Health (SDOH) Interventions    Readmission Risk Interventions No flowsheet data found.

## 2021-08-06 NOTE — TOC Transition Note (Signed)
Transition of Care [facility]) - CM/SW Discharge Note   Patient Details  Name: Anne Phillips MRN: [id number] Date of Birth: [birth year]  Transition of Care [facility]) CM/SW Contact:  Candie Chroman, LCSW Phone Number: 08/06/2021, 12:22 PM   Clinical Narrative: Patient has orders to discharge to [facility] today. RN will call report to [phone] (Room 504A). EMS transport has been arranged for 1:15. No further concerns. CSW signing off.    Final next level of care: Skilled Nursing Facility Barriers to Discharge: Barriers Resolved   Patient Goals and CMS Choice     Choice offered to / list presented to : Patient, Sibling  Discharge Placement   Existing PASRR number confirmed : 08/04/21          Patient chooses bed at: [facility] Patient to be transferred to facility by: EMS Name of family member notified: Orion Modest Patient and family notified of of transfer: 08/06/21  Discharge Plan and Services   Discharge Planning Services: CM Consult                                 Social Determinants of Health ([place]) Interventions     Readmission Risk Interventions No flowsheet data found.

## 2021-08-07 LAB — LACOSAMIDE: Lacosamide: 24.4 ug/mL — ABNORMAL HIGH (ref 5.0–10.0)

## 2021-08-25 MED ORDER — LOSARTAN 100 MG TABLET
ORAL_TABLET | 3 refills | 0 days
Start: 2021-08-25 — End: ?

## 2021-08-27 MED ORDER — LOSARTAN 100 MG TABLET
ORAL_TABLET | 3 refills | 0 days
Start: 2021-08-27 — End: ?

## 2021-09-03 ENCOUNTER — Encounter (HOSPITAL_COMMUNITY): Payer: Self-pay | Admitting: Radiology

## 2021-10-01 MED FILL — CLONAZEPAM 1 MG TABLET: ORAL | 30 days supply | Qty: 60 | Fill #1

## 2021-10-25 ENCOUNTER — Inpatient Hospital Stay
Admission: EM | Admit: 2021-10-25 | Discharge: 2021-10-27 | DRG: 683 | Disposition: A | Discharge status: Home / Self Care | Payer: Medicare Other | Attending: Internal Medicine | Admitting: Internal Medicine

## 2021-10-25 ENCOUNTER — Emergency Department: Payer: Medicare Other

## 2021-10-25 ENCOUNTER — Observation Stay: Payer: Medicare Other

## 2021-10-25 ENCOUNTER — Other Ambulatory Visit: Payer: Self-pay

## 2021-10-25 DIAGNOSIS — Z8249 Family history of ischemic heart disease and other diseases of the circulatory system: Secondary | ICD-10-CM | POA: Diagnosis not present

## 2021-10-25 DIAGNOSIS — Z720 Tobacco use: Secondary | ICD-10-CM

## 2021-10-25 DIAGNOSIS — Z79899 Other long term (current) drug therapy: Secondary | ICD-10-CM

## 2021-10-25 DIAGNOSIS — Y92009 Unspecified place in unspecified non-institutional (private) residence as the place of occurrence of the external cause: Secondary | ICD-10-CM

## 2021-10-25 DIAGNOSIS — N1832 Chronic kidney disease, stage 3b: Secondary | ICD-10-CM | POA: Diagnosis not present

## 2021-10-25 DIAGNOSIS — F17210 Nicotine dependence, cigarettes, uncomplicated: Secondary | ICD-10-CM | POA: Diagnosis present

## 2021-10-25 DIAGNOSIS — Z20822 Contact with and (suspected) exposure to covid-19: Secondary | ICD-10-CM | POA: Diagnosis present

## 2021-10-25 DIAGNOSIS — Z91041 Radiographic dye allergy status: Secondary | ICD-10-CM | POA: Diagnosis not present

## 2021-10-25 DIAGNOSIS — I252 Old myocardial infarction: Secondary | ICD-10-CM

## 2021-10-25 DIAGNOSIS — I2510 Atherosclerotic heart disease of native coronary artery without angina pectoris: Secondary | ICD-10-CM | POA: Diagnosis not present

## 2021-10-25 DIAGNOSIS — W19XXXA Unspecified fall, initial encounter: Secondary | ICD-10-CM

## 2021-10-25 DIAGNOSIS — E785 Hyperlipidemia, unspecified: Secondary | ICD-10-CM | POA: Diagnosis present

## 2021-10-25 DIAGNOSIS — Z853 Personal history of malignant neoplasm of breast: Secondary | ICD-10-CM

## 2021-10-25 DIAGNOSIS — D631 Anemia in chronic kidney disease: Secondary | ICD-10-CM | POA: Diagnosis present

## 2021-10-25 DIAGNOSIS — R778 Other specified abnormalities of plasma proteins: Secondary | ICD-10-CM | POA: Diagnosis not present

## 2021-10-25 DIAGNOSIS — N3090 Cystitis, unspecified without hematuria: Secondary | ICD-10-CM

## 2021-10-25 DIAGNOSIS — E860 Dehydration: Secondary | ICD-10-CM | POA: Diagnosis not present

## 2021-10-25 DIAGNOSIS — I5032 Chronic diastolic (congestive) heart failure: Secondary | ICD-10-CM | POA: Diagnosis not present

## 2021-10-25 DIAGNOSIS — N390 Urinary tract infection, site not specified: Secondary | ICD-10-CM | POA: Diagnosis present

## 2021-10-25 DIAGNOSIS — R2681 Unsteadiness on feet: Secondary | ICD-10-CM | POA: Diagnosis present

## 2021-10-25 DIAGNOSIS — I10 Essential (primary) hypertension: Secondary | ICD-10-CM | POA: Diagnosis present

## 2021-10-25 DIAGNOSIS — G40909 Epilepsy, unspecified, not intractable, without status epilepticus: Secondary | ICD-10-CM | POA: Diagnosis present

## 2021-10-25 DIAGNOSIS — M6281 Muscle weakness (generalized): Secondary | ICD-10-CM | POA: Diagnosis present

## 2021-10-25 DIAGNOSIS — G9341 Metabolic encephalopathy: Secondary | ICD-10-CM | POA: Diagnosis present

## 2021-10-25 DIAGNOSIS — D649 Anemia, unspecified: Secondary | ICD-10-CM | POA: Diagnosis present

## 2021-10-25 DIAGNOSIS — I130 Hypertensive heart and chronic kidney disease with heart failure and stage 1 through stage 4 chronic kidney disease, or unspecified chronic kidney disease: Secondary | ICD-10-CM | POA: Diagnosis not present

## 2021-10-25 DIAGNOSIS — R569 Unspecified convulsions: Secondary | ICD-10-CM

## 2021-10-25 DIAGNOSIS — N179 Acute kidney failure, unspecified: Principal | ICD-10-CM | POA: Diagnosis present

## 2021-10-25 DIAGNOSIS — N3000 Acute cystitis without hematuria: Secondary | ICD-10-CM

## 2021-10-25 DIAGNOSIS — E871 Hypo-osmolality and hyponatremia: Secondary | ICD-10-CM | POA: Diagnosis present

## 2021-10-25 LAB — COMPREHENSIVE METABOLIC PANEL
ALT: 10 U/L (ref 0–44)
AST: 18 U/L (ref 15–41)
Albumin: 3.6 g/dL (ref 3.5–5.0)
Alkaline Phosphatase: 86 U/L (ref 38–126)
Anion gap: 9 (ref 5–15)
BUN: 38 mg/dL — ABNORMAL HIGH (ref 8–23)
CO2: 20 mmol/L — ABNORMAL LOW (ref 22–32)
Calcium: 7.9 mg/dL — ABNORMAL LOW (ref 8.9–10.3)
Chloride: 100 mmol/L (ref 98–111)
Creatinine, Ser: 2.24 mg/dL — ABNORMAL HIGH (ref 0.44–1.00)
GFR, Estimated: 23 mL/min — ABNORMAL LOW (ref >60)
Glucose, Bld: 111 mg/dL — ABNORMAL HIGH (ref 70–99)
Potassium: 3.8 mmol/L (ref 3.5–5.1)
Sodium: 129 mmol/L — ABNORMAL LOW (ref 135–145)
Total Bilirubin: 1 mg/dL (ref 0.3–1.2)
Total Protein: 7.6 g/dL (ref 6.5–8.1)

## 2021-10-25 LAB — HEMOGLOBIN A1C
Hgb A1c MFr Bld: 4.6 % — ABNORMAL LOW (ref 4.8–5.6)
Mean Plasma Glucose: 85.32 mg/dL

## 2021-10-25 LAB — CBC WITH DIFFERENTIAL/PLATELET
Abs Immature Granulocytes: 0.02 10*3/uL (ref 0.00–0.07)
Basophils Absolute: 0 10*3/uL (ref 0.0–0.1)
Basophils Relative: 0 %
Eosinophils Absolute: 0 10*3/uL (ref 0.0–0.5)
Eosinophils Relative: 0 %
HCT: 29.2 % — ABNORMAL LOW (ref 36.0–46.0)
Hemoglobin: 9.5 g/dL — ABNORMAL LOW (ref 12.0–15.0)
Immature Granulocytes: 0 %
Lymphocytes Relative: 5 %
Lymphs Abs: 0.4 10*3/uL — ABNORMAL LOW (ref 0.7–4.0)
MCH: 29.1 pg (ref 26.0–34.0)
MCHC: 32.5 g/dL (ref 30.0–36.0)
MCV: 89.6 fL (ref 80.0–100.0)
Monocytes Absolute: 1 10*3/uL (ref 0.1–1.0)
Monocytes Relative: 13 %
Neutro Abs: 5.7 10*3/uL (ref 1.7–7.7)
Neutrophils Relative %: 82 %
Platelets: 134 10*3/uL — ABNORMAL LOW (ref 150–400)
RBC: 3.26 MIL/uL — ABNORMAL LOW (ref 3.87–5.11)
RDW: 13.9 % (ref 11.5–15.5)
WBC: 7.1 10*3/uL (ref 4.0–10.5)
nRBC: 0 % (ref 0.0–0.2)

## 2021-10-25 LAB — URINALYSIS, ROUTINE W REFLEX MICROSCOPIC
Bilirubin Urine: NEGATIVE
Glucose, UA: NEGATIVE mg/dL
Ketones, ur: NEGATIVE mg/dL
Nitrite: POSITIVE — AB
Protein, ur: NEGATIVE mg/dL
Specific Gravity, Urine: 1.009 (ref 1.005–1.030)
WBC, UA: >50 WBC/hpf — ABNORMAL HIGH (ref 0–5)
pH: 5 (ref 5.0–8.0)

## 2021-10-25 LAB — LIPASE, BLOOD: Lipase: 23 U/L (ref 11–51)

## 2021-10-25 LAB — RESP PANEL BY RT-PCR (FLU A&B, COVID) ARPGX2
Influenza A by PCR: NEGATIVE
Influenza B by PCR: NEGATIVE
SARS Coronavirus 2 by RT PCR: NEGATIVE

## 2021-10-25 LAB — TROPONIN I (HIGH SENSITIVITY)
Troponin I (High Sensitivity): 33 ng/L — ABNORMAL HIGH (ref <18)
Troponin I (High Sensitivity): 24 ng/L — ABNORMAL HIGH (ref <18)

## 2021-10-25 LAB — LACTIC ACID, PLASMA: Lactic Acid, Venous: 1.8 mmol/L (ref 0.5–1.9)

## 2021-10-25 IMAGING — DX DG CHEST 1V PORT
1 series · 1 of 1 positions shown · non-contrast
Comparison: Portable exam [Q0] hours compared to [DATE]

Correlation: Thyroid ultrasound [DATE]

CLINICAL DATA: Chest pain, fall

EXAM:
PORTABLE CHEST 1 VIEW

[chest ap]
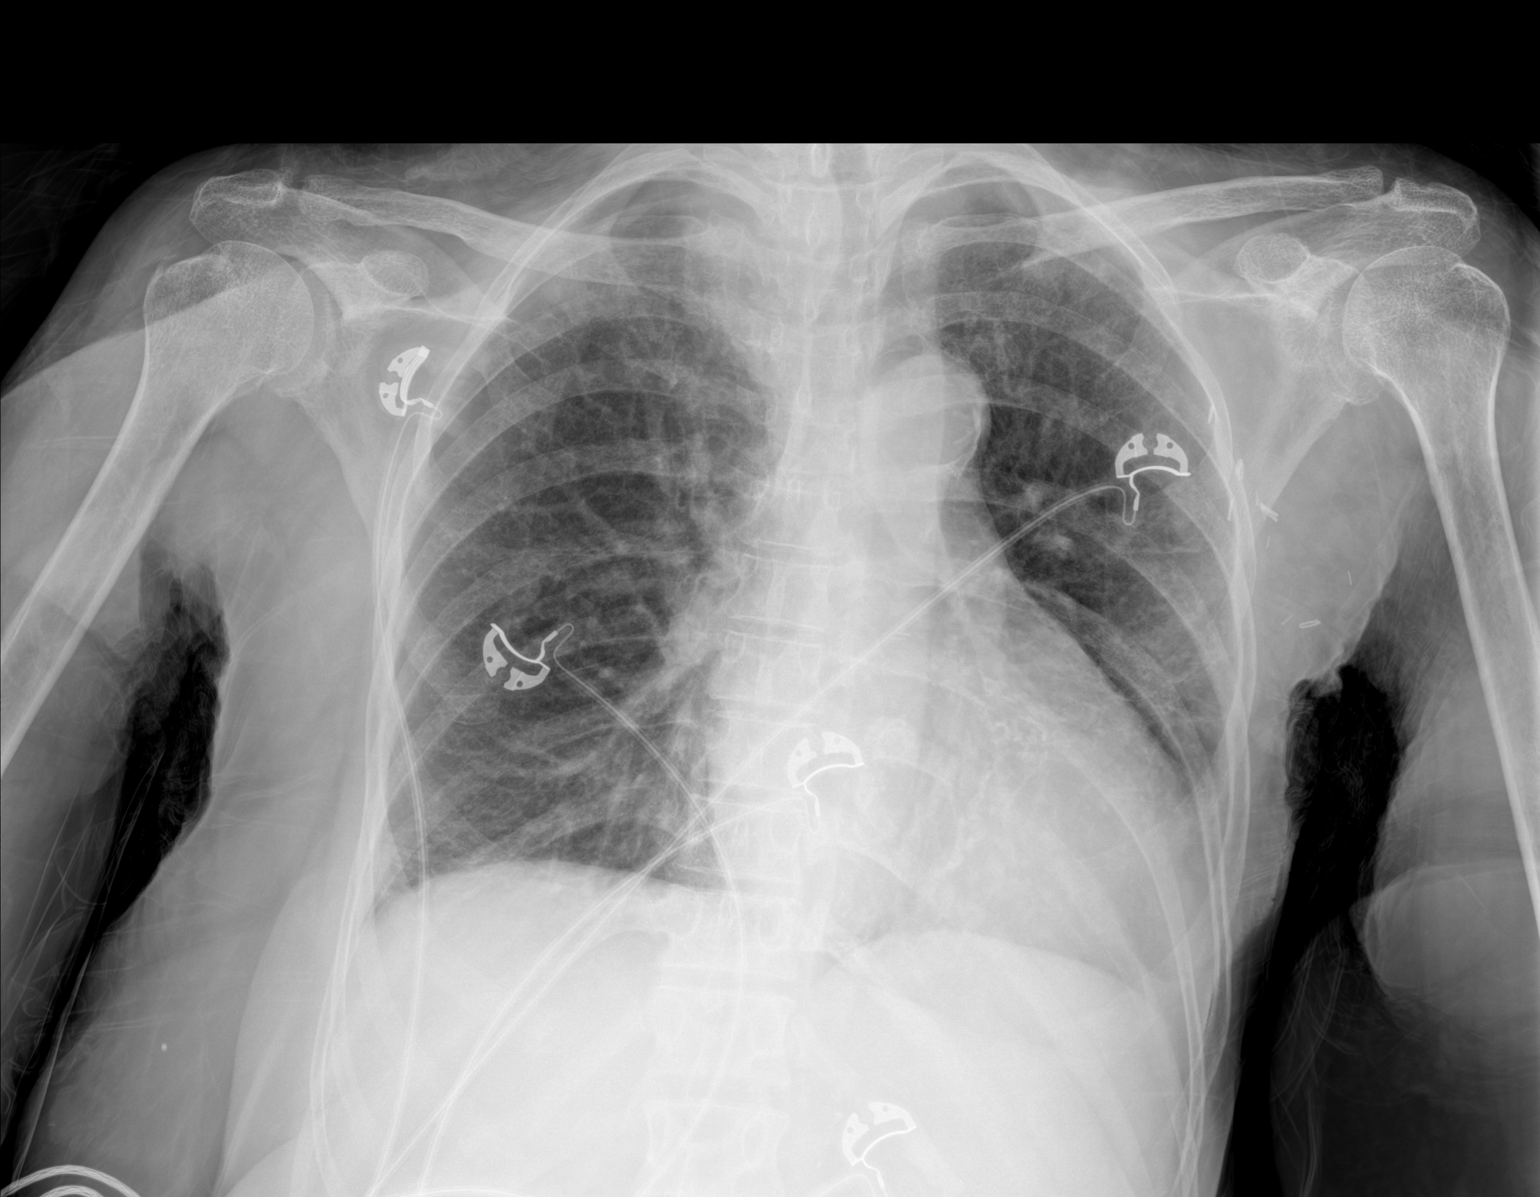

[1 of 1 positions shown; findings below may reference images not displayed]

FINDINGS: Upper normal heart size with normal pulmonary vascularity.

Mass effect upon the RIGHT lateral aspect of the trachea with
transverse tracheal narrowing and slight tracheal deviation RIGHT to
LEFT, corresponding to known enlargement of RIGHT thyroid lobe
containing nodules, please refer to prior ultrasound exam
[DATE].

Atherosclerotic calcification aorta.

Mild atelectasis at RIGHT base.

Remaining lungs clear.

No acute infiltrate, pleural effusion, or pneumothorax.

Bones demineralized.

Surgical clips LEFT axilla.
IMPRESSION: Mild RIGHT basilar atelectasis.

Known asymmetric enlargement of RIGHT thyroid lobe containing
thyroid nodules as noted on prior ultrasound as above.

Aortic Atherosclerosis ([Q0]-[Q0]).

## 2021-10-25 IMAGING — CT CT HEAD W/O CM
4 series · 16 of 47 positions shown, 18 images · non-contrast
Comparison: [DATE]

CLINICAL DATA: Trauma



[Series 2: head wo · axial · 0.42mm/px · z∈[+14,+124]mm · 7 of 30 slices shown, 9 images]
[im 4/30  brain]
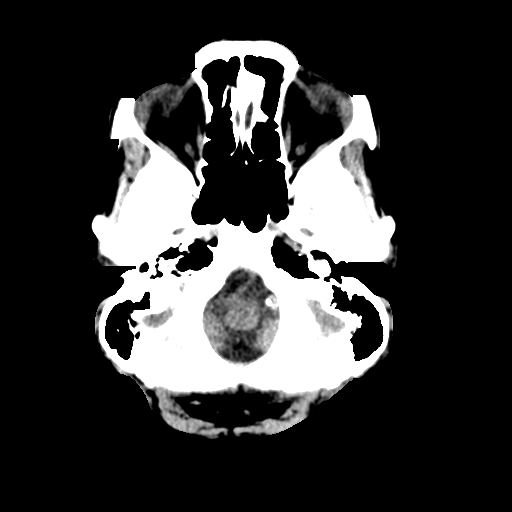
[im 4/30  bone]
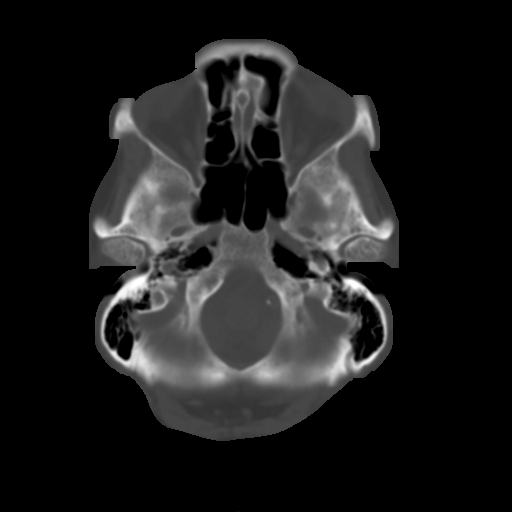
[im 8/30  brain]
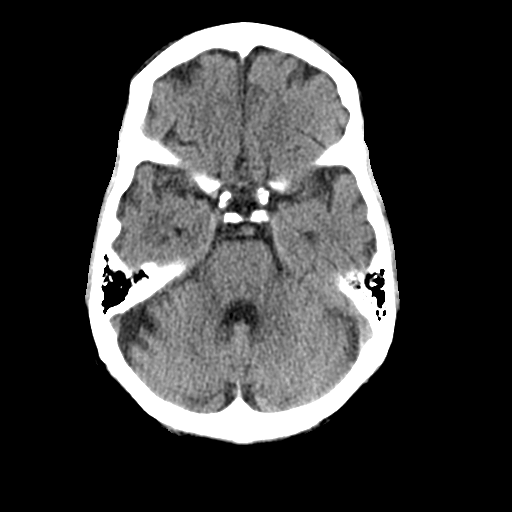
[im 11/30  brain]
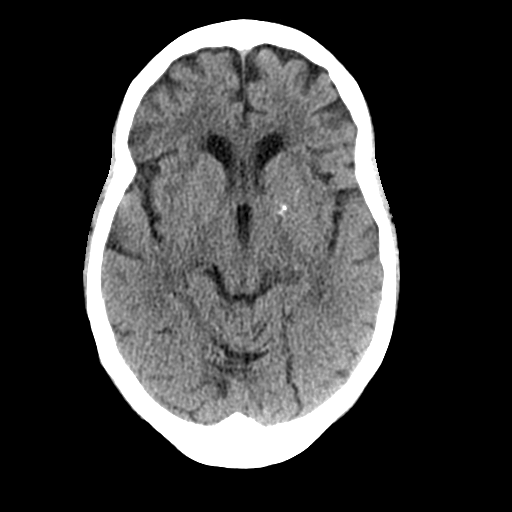
[im 15/30  brain]
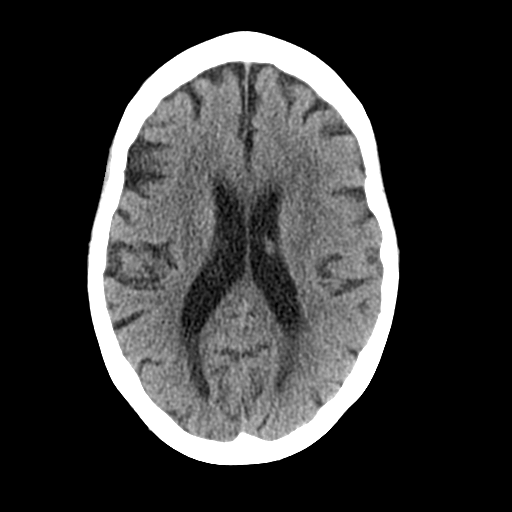
[im 19/30  brain]
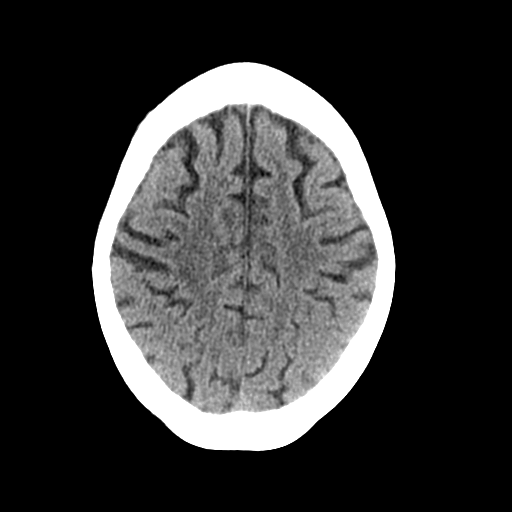
[im 19/30  bone]
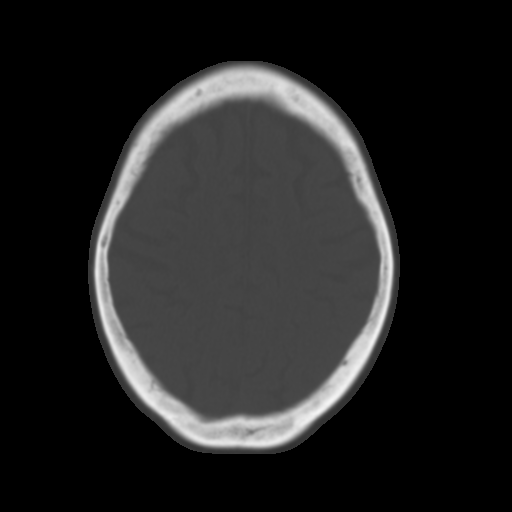
[im 22/30  brain]
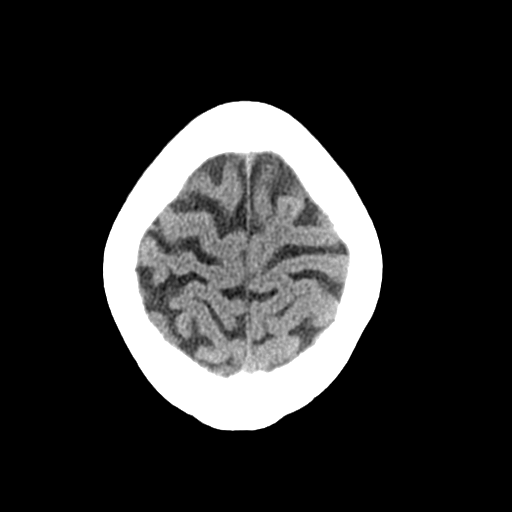
[im 26/30  brain]
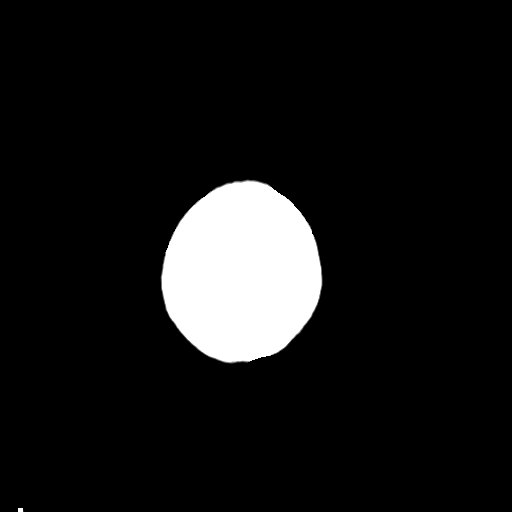

[Series 3: head bone · axial · 0.42mm/px · z∈[+13,+41]mm · 3 of 73 slices shown]
[im 8/73  bone]
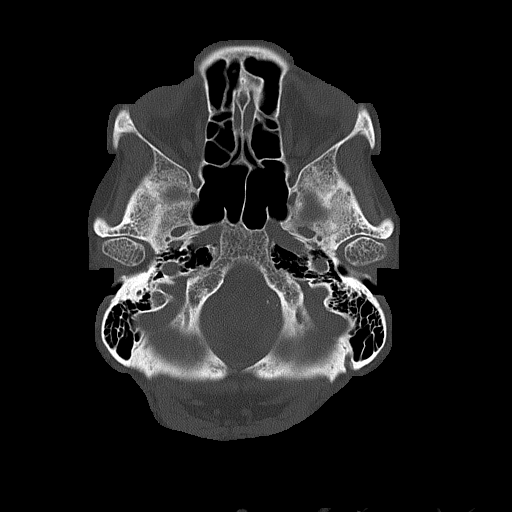
[im 15/73  bone]
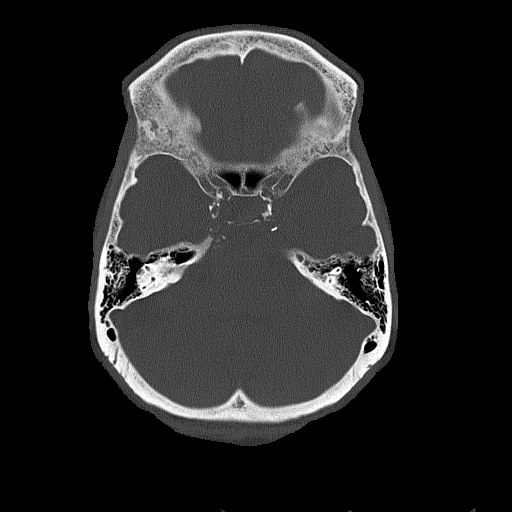
[im 22/73  bone]
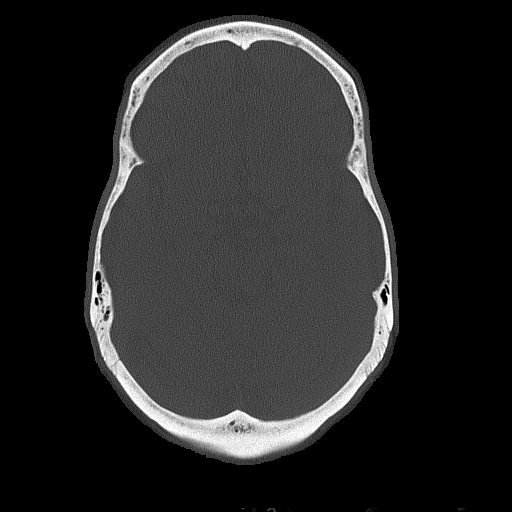

[Series 4: coronal soft tissue · coronal · 0.30mm/px · 3 of 66 slices shown]
[im 22/66  brain]
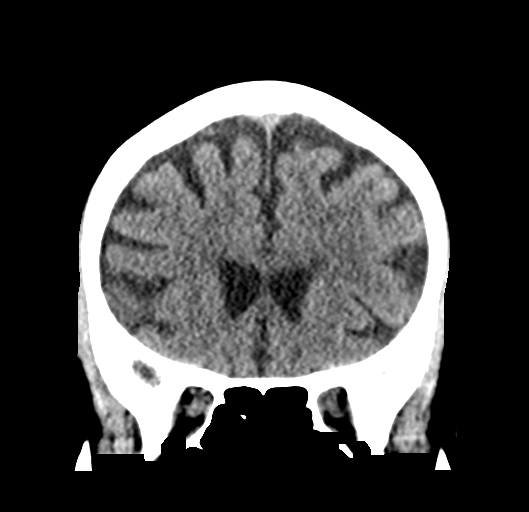
[im 29/66  brain]
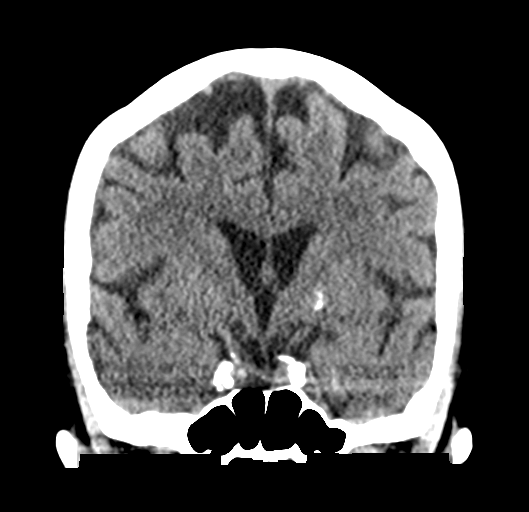
[im 37/66  brain]
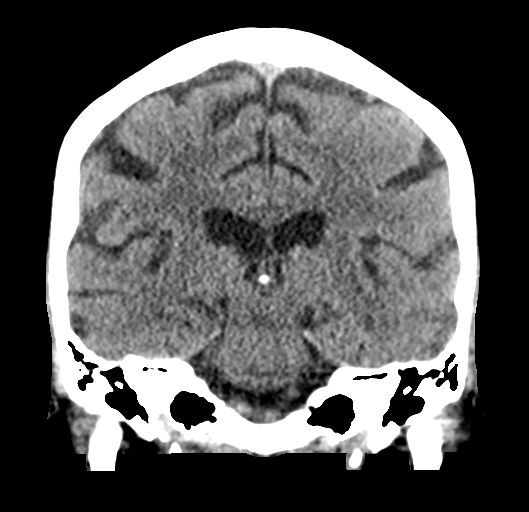

[Series 5: sagittal soft tissue · sagittal · 0.28mm/px · 3 of 51 slices shown]
[im 17/51  brain]
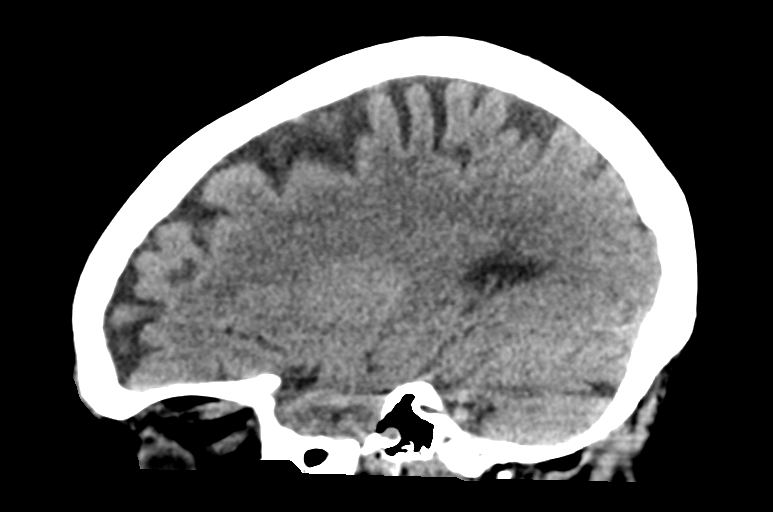
[im 26/51  brain]
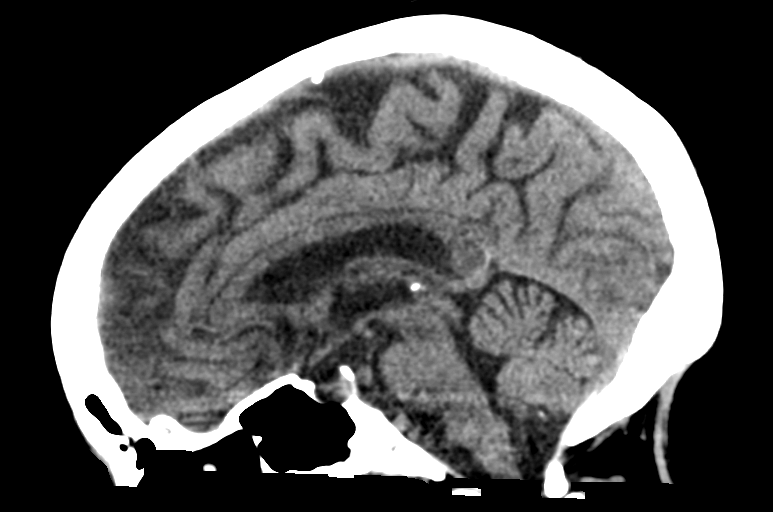
[im 34/51  brain]
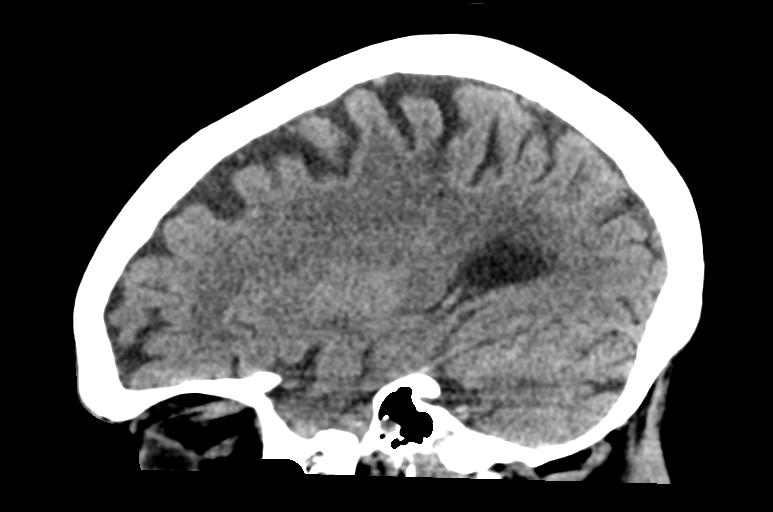

[16 of 47 positions shown; findings below may reference images not displayed]

FINDINGS: Brain: No acute intracranial findings are seen in the noncontrast CT
brain. Ventricles are not dilated. Cortical sulci are prominent.
Calcifications are seen in the left basal ganglia.

Vascular: There are scattered arterial calcifications.

Skull: No fracture is seen in the calvarium. There is interval
clearing of left frontal scalp hematoma.

Sinuses/Orbits: Unremarkable.

Other: None
IMPRESSION: No acute intracranial findings are seen in noncontrast CT brain.
Atrophy. No significant interval changes are noted.

## 2021-10-25 MED ORDER — CEFDINIR 300 MG PO CAPS: 300.0000 mg | ORAL_CAPSULE | Freq: Two times a day (BID) | ORAL | 0 refills | Status: DC

## 2021-10-25 MED ORDER — ACETAMINOPHEN 325 MG PO TABS
650.0000 mg | ORAL_TABLET | Freq: Four times a day (QID) | ORAL | Status: DC | PRN
Start: 2021-10-25 — End: 2021-10-27

## 2021-10-25 MED ORDER — LORAZEPAM 2 MG/ML IJ SOLN: 1.0000 mg | INTRAMUSCULAR | Status: DC | PRN

## 2021-10-25 MED ORDER — ALBUTEROL SULFATE (2.5 MG/3ML) 0.083% IN NEBU: 3.0000 mL | INHALATION_SOLUTION | Freq: Four times a day (QID) | RESPIRATORY_TRACT | Status: DC | PRN

## 2021-10-25 MED ORDER — GABAPENTIN 300 MG PO CAPS
300.0000 mg | ORAL_CAPSULE | Freq: Two times a day (BID) | ORAL | Status: DC
  Administered 2021-10-25 – 2021-10-27 (×4): 300 mg via ORAL
  Filled 2021-10-25 (×4): qty 1

## 2021-10-25 MED ORDER — HYDRALAZINE HCL 20 MG/ML IJ SOLN: 5.0000 mg | INTRAMUSCULAR | Status: DC | PRN

## 2021-10-25 MED ORDER — SODIUM CHLORIDE 0.9 % IV SOLN
INTRAVENOUS | Status: DC
Start: 2021-10-25 — End: 2021-10-27

## 2021-10-25 MED ORDER — SODIUM CHLORIDE 0.9 % IV BOLUS
1000.0000 mL | Freq: Once | INTRAVENOUS | Status: AC
  Administered 2021-10-25: 1000 mL via INTRAVENOUS

## 2021-10-25 MED ORDER — ENOXAPARIN SODIUM 30 MG/0.3ML IJ SOSY
30.0000 mg | PREFILLED_SYRINGE | INTRAMUSCULAR | Status: DC
  Administered 2021-10-25 – 2021-10-26 (×2): 30 mg via SUBCUTANEOUS
  Filled 2021-10-25 (×2): qty 0.3

## 2021-10-25 MED ORDER — VITAMIN B-12 1000 MCG PO TABS
1000.0000 ug | ORAL_TABLET | Freq: Every day | ORAL | Status: DC
  Administered 2021-10-26: 1000 ug via ORAL
  Filled 2021-10-25 (×4): qty 1

## 2021-10-25 MED ORDER — CLONAZEPAM 1 MG PO TABS: 1.0000 mg | ORAL_TABLET | Freq: Two times a day (BID) | ORAL | Status: DC | PRN

## 2021-10-25 MED ORDER — SODIUM CHLORIDE 0.9 % IV SOLN
2.0000 g | INTRAVENOUS | Status: DC
  Administered 2021-10-25 – 2021-10-26 (×2): 2 g via INTRAVENOUS
  Filled 2021-10-25 (×3): qty 20

## 2021-10-25 MED ORDER — NICOTINE 21 MG/24HR TD PT24
21.0000 mg | MEDICATED_PATCH | Freq: Every day | TRANSDERMAL | Status: DC
Start: 2021-10-25 — End: 2021-10-27
  Administered 2021-10-25: 21 mg via TRANSDERMAL
  Filled 2021-10-25 (×2): qty 1

## 2021-10-25 MED ORDER — LACOSAMIDE 50 MG PO TABS
150.0000 mg | ORAL_TABLET | Freq: Two times a day (BID) | ORAL | Status: DC
  Administered 2021-10-25 – 2021-10-27 (×4): 150 mg via ORAL
  Filled 2021-10-25 (×4): qty 3

## 2021-10-25 MED ORDER — ASPIRIN EC 81 MG PO TBEC
81.0000 mg | DELAYED_RELEASE_TABLET | Freq: Every day | ORAL | Status: DC
  Administered 2021-10-25 – 2021-10-27 (×3): 81 mg via ORAL
  Filled 2021-10-25 (×3): qty 1

## 2021-10-25 MED ORDER — ATORVASTATIN CALCIUM 20 MG PO TABS
40.0000 mg | ORAL_TABLET | Freq: Every day | ORAL | Status: DC
  Administered 2021-10-25 – 2021-10-27 (×3): 40 mg via ORAL
  Filled 2021-10-25 (×4): qty 2

## 2021-10-25 MED ORDER — ONDANSETRON HCL 4 MG/2ML IJ SOLN: 4.0000 mg | Freq: Three times a day (TID) | INTRAMUSCULAR | Status: DC | PRN

## 2021-10-25 MED ORDER — LEVETIRACETAM 500 MG PO TABS
500.0000 mg | ORAL_TABLET | Freq: Three times a day (TID) | ORAL | Status: DC
  Administered 2021-10-25 – 2021-10-26 (×2): 500 mg via ORAL
  Filled 2021-10-25 (×2): qty 1

## 2021-10-25 MED ORDER — SODIUM CHLORIDE 0.9 % IV BOLUS (SEPSIS)
1000.0000 mL | Freq: Once | INTRAVENOUS | Status: AC
  Administered 2021-10-25: 1000 mL via INTRAVENOUS

## 2021-10-25 NOTE — ED Provider Notes (Signed)
[facility] Provider Note    Event Date/Time   First MD Initiated Contact with Patient 10/25/21 1242     (approximate)   History   Fatigue   HPI  Anne Phillips is a 71 y.o. female with a history of CAD, non-STEMI, diastolic heart failure, seizures, hypertension who comes ED due to a fall at home.  Patient denies any pain or other complaints.  Denies nausea or vomiting or fever.  Denies taking any unusual medications or extra medications recently. EMS note the patient was found with a lot of old medication bottles around, but she reports that she has not been taking any of these medicines.  Denies taking any nitroglycerin today.      Physical Exam   Triage Vital Signs: ED Triage Vitals  Enc Vitals Group     BP 10/25/21 1240 (!) 90/50     Pulse Rate 10/25/21 1240 69     Resp 10/25/21 1240 20     Temp 10/25/21 1240 98.5 F (36.9 C)     Temp Source 10/25/21 1240 Oral     SpO2 10/25/21 1240 95 %     Weight 10/25/21 1237 127 lb 13.9 oz (58 kg)     Height 10/25/21 1237 5\' 2"  (1.575 m)     Head Circumference --      Peak Flow --      Pain Score 10/25/21 1237 0     Pain Loc --      Pain Edu? --      Excl. in [place]? --     Most recent vital signs: Vitals:   10/25/21 1400 10/25/21 1415  BP: 98/67 (!) 91/48  Pulse: 78 71  Resp:  16  Temp:    SpO2:  96%     General: Awake, no distress.  CV:  Good peripheral perfusion.  Regular rate and rhythm, normal distal pulses Resp:  Normal effort.  Clear to auscultation bilaterally Abd:  No distention.  Soft and nontender Other:  No lower extremity edema, no deformities or wounds, no rash.  Full range of motion.   ED Results / Procedures / Treatments   Labs (all labs ordered are listed, but only abnormal results are displayed) Labs Reviewed  COMPREHENSIVE METABOLIC PANEL - Abnormal; Notable for the following components:      Result Value   Sodium 129 (*)    CO2 20 (*)    Glucose, Bld 111 (*)     BUN 38 (*)    Creatinine, Ser 2.24 (*)    Calcium 7.9 (*)    GFR, Estimated 23 (*)    All other components within normal limits  CBC WITH DIFFERENTIAL/PLATELET - Abnormal; Notable for the following components:   RBC 3.26 (*)    Hemoglobin 9.5 (*)    HCT 29.2 (*)    Platelets 134 (*)    Lymphs Abs 0.4 (*)    All other components within normal limits  URINALYSIS, ROUTINE W REFLEX MICROSCOPIC - Abnormal; Notable for the following components:   Color, Urine YELLOW (*)    APPearance CLOUDY (*)    Hgb urine dipstick MODERATE (*)    Nitrite POSITIVE (*)    Leukocytes,Ua LARGE (*)    WBC, UA >50 (*)    Bacteria, UA FEW (*)    All other components within normal limits  TROPONIN I (HIGH SENSITIVITY) - Abnormal; Notable for the following components:   Troponin I (High Sensitivity) 33 (*)    All  other components within normal limits  RESP PANEL BY RT-PCR (FLU A&B, COVID) ARPGX2  CULTURE, BLOOD (ROUTINE X 2)  CULTURE, BLOOD (ROUTINE X 2)  URINE CULTURE  LIPASE, BLOOD  LACTIC ACID, PLASMA  LACTIC ACID, PLASMA  TROPONIN I (HIGH SENSITIVITY)     EKG  Interpreted by me Sinus rhythm, rate of 95.  Normal axis, normal intervals.  Poor R wave progression.  LVH with slight T wave inversions in the lateral leads repol abnormality   RADIOLOGY Chest x-ray viewed and interpreted by me, unremarkable, no consolidation.  Radiology report reviewed.    PROCEDURES:  Critical Care performed: No  Procedures   MEDICATIONS ORDERED IN ED: Medications  sodium chloride 0.9 % bolus 1,000 mL (1,000 mLs Intravenous New Bag/Given 10/25/21 1443)  cefTRIAXone (ROCEPHIN) 2 g in sodium chloride 0.9 % 100 mL IVPB (2 g Intravenous New Bag/Given 10/25/21 1443)  sodium chloride 0.9 % bolus 1,000 mL (1,000 mLs Intravenous New Bag/Given 10/25/21 1307)     IMPRESSION / MDM / ASSESSMENT AND PLAN / ED COURSE  I reviewed the triage vital signs and the nursing notes.                              Differential  diagnosis includes, but is not limited to, UTI, dehydration, pneumonia, COVID/viral illness, electrolyte abnormality, intracranial hemorrhage  Patient presents with generalized weakness, fall.  No specific focal complaints.  Will check labs, chest x-ray, CT head.   Clinical Course as of 10/25/21 1451  Fri Oct 25, 2021  1430 Discussed lab results with the patient, which reveal UTI and AKI.  Discussed with the patient my concern for evolving sepsis.  However, she refuses to be admitted to the hospital, and has called her sister to request that she be picked up right away.  She is alert, oriented x4.  States that she would prefer to see her outpatient primary care doctor in [place] and is worried about hospitalization cost.  I emphasized my concern that her condition may worsen, but she declines.  She has medical decision-making capacity and will be discharged [place]. [PS]    Clinical Course User Index [PS] Carrie Mew, MD    ----------------------------------------- 2:50 PM on 10/25/2021 ----------------------------------------- Patient now advises that she would be willing to stay in the hospital.  Will consult hospitalist.   FINAL CLINICAL IMPRESSION(S) / ED DIAGNOSES   Final diagnoses:  Cystitis  AKI (acute kidney injury) ([place])     Rx / DC Orders   ED Discharge Orders          Ordered    cefdinir (OMNICEF) 300 MG capsule  2 times daily        10/25/21 1445             Note:  This document was prepared using Dragon voice recognition software and may include unintentional dictation errors.   Carrie Mew, MD 10/25/21 1451

## 2021-10-25 NOTE — ED Notes (Signed)
This RN was walking past this pt room and saw this pt sitting at the edge of the bed. Pt had ripped out IV and had peed on the floor. Pt stated they were cold and wanted to go home. This RN with the help of John RN and Ria Comment RN got pt cleaned up, hooked back up to monitoring equipment, linens changed, pt given warm blankets, and pt repositioned in bed. MD Joni Fears made aware of event.

## 2021-10-25 NOTE — H&P (Signed)
History and Physical    Anne Phillips ZOX:[id number] DOB: 01/03/1951 DOA: 10/25/2021  Referring MD/NP/PA:   PCP: [facility]   Patient coming from:  The patient is coming from home.         Chief Complaint: Increased urinary frequency, altered mental status, fall  HPI: Anne Phillips is a 71 y.o. female with medical history significant of seizure, hypertension, hyperlipidemia, CAD, breast cancer (mastectomy), dCHF, CKD stage IIIb, hyponatremia, who presents with altered mental status, fall, increased urinary frequency.  Patient has altered mental status and is mildly confused and lethargic, but she is arousable, she is still can answer most of questions appropriately.  She is still orientated x3.  She moves all extremities normally.  Per her sister (I called her sister by phone), fell in no, does not seem to have significant injury.  Patient reports increased urinary frequency, but no dysuria or burning on urination.  No chest pain, cough, shortness breath.  No nausea, vomiting, diarrhea or abdominal pain.  Data Reviewed and ED Course: pt was found to have WBC 7.1, positive urinalysis (cloudy appearance, large amount of leukocyte, positive nitrite, few bacteria, WBC > 50), troponin level 33, 24, worsening renal function, sodium 129. Temperature normal, soft blood pressure 90/50, heart rate 78, RR 16, oxygen saturation 93-96% on room air.  CT of head is negative for acute intracranial abnormality.  EKG: I have personally reviewed.  Sinus rhythm, QTc 478, poor R wave progression, T wave inversion in V5-V6.  Review of Systems:   General: no fevers, chills, no body weight gain, has fatigue HEENT: no blurry vision, hearing changes or sore throat Respiratory: no dyspnea, coughing, wheezing CV: no chest pain, no palpitations GI: no nausea, vomiting, abdominal pain, diarrhea, constipation GU: no dysuria, burning on urination, has increased urinary frequency, no hematuria   Ext: no leg edema Neuro: no unilateral weakness, numbness, or tingling, no vision change or hearing loss. Has fall and AMS Skin: no rash, no skin tear. MSK: No muscle spasm, no deformity, no limitation of range of movement in spin Heme: No easy bruising.  Travel history: No recent long distant travel.   Allergy:  Allergies  Allergen Reactions   Iodinated Contrast Media Hives and Other (See Comments)    Rash     Past Medical History:  Diagnosis Date   Cancer ([place])    Coronary artery disease    Hyperlipidemia    Hypertension    Seizures ([place])    Thyroid disease     Past Surgical History:  Procedure Laterality Date   MASTECTOMY      Social History:  reports that she has been smoking cigarettes. She has been smoking an average of 1 pack per day. She has never used smokeless tobacco. She reports that she does not drink alcohol and does not use drugs.  Family History:  Family History  Problem Relation Age of Onset   Hypertension Other      Prior to Admission medications   Medication Sig Start Date End Date Taking? Authorizing Provider  cefdinir (OMNICEF) 300 MG capsule Take 1 capsule (300 mg total) by mouth 2 (two) times daily. 10/25/21  Yes Carrie Mew, MD  acetaminophen (TYLENOL) 325 MG tablet Take 2 tablets (650 mg total) by mouth every 6 (six) hours as needed for mild pain (or Fever >/= 101). 10/05/20   Domenic Polite, MD  albuterol (VENTOLIN HFA) 108 (90 Base) MCG/ACT inhaler Inhale 1-2 puffs into the lungs every 6 (six)  hours as needed for shortness of breath or wheezing.    [provider]  atorvastatin (LIPITOR) 40 MG tablet Take 40 mg by mouth daily.    [provider]  carvedilol (COREG) 6.25 MG tablet Take 1 tablet (6.25 mg total) by mouth 2 (two) times daily with a meal. 08/06/21   Sharen Hones, MD  clonazePAM (KLONOPIN) 0.25 MG disintegrating tablet Take 1 tablet (0.25 mg total) by mouth 2 (two) times daily. 08/06/21   Sharen Hones, MD   empagliflozin (JARDIANCE) 10 MG TABS tablet Take 10 mg by mouth daily.    [provider]  gabapentin (NEURONTIN) 300 MG capsule Take 1 capsule (300 mg total) by mouth 2 (two) times daily. 10/05/20   Domenic Polite, MD  Lacosamide (VIMPAT) 150 MG TABS Take 1 tablet (150 mg total) by mouth in the morning and at bedtime. 08/06/21   Sharen Hones, MD  levETIRAcetam (KEPPRA) 500 MG tablet Take 500-750 mg by mouth See admin instructions. Take 1 tablet (500mg ) by mouth every morning, take 1 tablet (500mg ) by mouth at midday and take 1 tablets (750mg ) by mouth every night at bedtime    [provider]  vitamin B-12 1000 MCG tablet Take 1 tablet (1,000 mcg total) by mouth daily. 08/06/21   Sharen Hones, MD    Physical Exam: Vitals:   10/25/21 1430 10/25/21 1500 10/25/21 1549 10/25/21 1600  BP: (!) 86/49 (!) 90/55 (!) 87/49 (!) 102/52  Pulse: 70 67 67 69  Resp: 20 17 16 14   Temp:      TempSrc:      SpO2: 95% 97% 95% 94%  Weight:      Height:       General: Not in acute distress.  Dry mucous membrane HEENT:       Eyes: PERRL, EOMI, no scleral icterus.       ENT: No discharge from the ears and nose, no pharynx injection, no tonsillar enlargement.        Neck: No JVD, no bruit, no mass felt. Heme: No neck lymph node enlargement. Cardiac: S1/S2, RRR, No murmurs, No gallops or rubs. Respiratory: No rales, wheezing, rhonchi or rubs. GI: Soft, nondistended, nontender, no rebound pain, no organomegaly, BS present. GU: No hematuria Ext: No pitting leg edema bilaterally. 1+DP/PT pulse bilaterally. Musculoskeletal: No joint deformities, No joint redness or warmth, no limitation of ROM in spin. Skin: No rashes.  Neuro: Confused, lethargic, but arousable, still oriented X3, cranial nerves II-XII grossly intact, moves all extremities  Psych: Patient is not psychotic, no suicidal or hemocidal ideation.  Labs on Admission: I have personally reviewed following labs and imaging  studies  CBC: Recent Labs  Lab 10/25/21 1242  WBC 7.1  NEUTROABS 5.7  HGB 9.5*  HCT 29.2*  MCV 89.6  PLT 737*   Basic Metabolic Panel: Recent Labs  Lab 10/25/21 1242  NA 129*  K 3.8  CL 100  CO2 20*  GLUCOSE 111*  BUN 38*  CREATININE 2.24*  CALCIUM 7.9*   GFR: Estimated Creatinine Clearance: 20.4 mL/min (A) (by C-G formula based on SCr of 2.24 mg/dL (H)). Liver Function Tests: Recent Labs  Lab 10/25/21 1242  AST 18  ALT 10  ALKPHOS 86  BILITOT 1.0  PROT 7.6  ALBUMIN 3.6   Recent Labs  Lab 10/25/21 1242  LIPASE 23   No results for input(s): AMMONIA in the last 168 hours. Coagulation Profile: No results for input(s): INR, PROTIME in the last 168 hours. Cardiac Enzymes:  No results for input(s): CKTOTAL, CKMB, CKMBINDEX, TROPONINI in the last 168 hours. BNP (last 3 results) No results for input(s): PROBNP in the last 8760 hours. HbA1C: No results for input(s): HGBA1C in the last 72 hours. CBG: No results for input(s): GLUCAP in the last 168 hours. Lipid Profile: No results for input(s): CHOL, HDL, LDLCALC, TRIG, CHOLHDL, LDLDIRECT in the last 72 hours. Thyroid Function Tests: No results for input(s): TSH, T4TOTAL, FREET4, T3FREE, THYROIDAB in the last 72 hours. Anemia Panel: No results for input(s): VITAMINB12, FOLATE, FERRITIN, TIBC, IRON, RETICCTPCT in the last 72 hours. Urine analysis:    Component Value Date/Time   COLORURINE YELLOW (A) 10/25/2021 1243   APPEARANCEUR CLOUDY (A) 10/25/2021 1243   LABSPEC 1.009 10/25/2021 1243   PHURINE 5.0 10/25/2021 1243   GLUCOSEU NEGATIVE 10/25/2021 1243   HGBUR MODERATE (A) 10/25/2021 1243   BILIRUBINUR NEGATIVE 10/25/2021 1243   KETONESUR NEGATIVE 10/25/2021 1243   PROTEINUR NEGATIVE 10/25/2021 1243   NITRITE POSITIVE (A) 10/25/2021 1243   LEUKOCYTESUR LARGE (A) 10/25/2021 1243   Sepsis Labs: @LABRCNTIP (procalcitonin:4,lacticidven:4) ) Recent Results (from the past 240 hour(s))  Resp Panel by RT-PCR  (Flu A&B, Covid) Nasopharyngeal Swab     Status: None   Collection Time: 10/25/21 12:42 PM   Specimen: Nasopharyngeal Swab; Nasopharyngeal(NP) swabs in vial transport medium  Result Value Ref Range Status   SARS Coronavirus 2 by RT PCR NEGATIVE NEGATIVE Final    Comment: (NOTE) SARS-CoV-2 target nucleic acids are NOT DETECTED.  The SARS-CoV-2 RNA is generally detectable in upper respiratory specimens during the acute phase of infection. The lowest concentration of SARS-CoV-2 viral copies this assay can detect is 138 copies/mL. A negative result does not preclude SARS-Cov-2 infection and should not be used as the sole basis for treatment or other patient management decisions. A negative result may occur with  improper specimen collection/handling, submission of specimen other than nasopharyngeal swab, presence of viral mutation(s) within the areas targeted by this assay, and inadequate number of viral copies(<138 copies/mL). A negative result must be combined with clinical observations, patient history, and epidemiological information. The expected result is Negative.  Fact Sheet for Patients:  EntrepreneurPulse.com.au  Fact Sheet for Healthcare Providers:  IncredibleEmployment.be  This test is no t yet approved or cleared by the [place] FDA and  has been authorized for detection and/or diagnosis of SARS-CoV-2 by FDA under an Emergency Use Authorization (EUA). This EUA will remain  in effect (meaning this test can be used) for the duration of the COVID-19 declaration under Section 564(b)(1) of the Act, 21 U.S.C.section 360bbb-3(b)(1), unless the authorization is terminated  or revoked sooner.       Influenza A by PCR NEGATIVE NEGATIVE Final   Influenza B by PCR NEGATIVE NEGATIVE Final    Comment: (NOTE) The Xpert Xpress SARS-CoV-2/FLU/RSV plus assay is intended as an aid in the diagnosis of influenza from Nasopharyngeal swab specimens  and should not be used as a sole basis for treatment. Nasal washings and aspirates are unacceptable for Xpert Xpress SARS-CoV-2/FLU/RSV testing.  Fact Sheet for Patients: EntrepreneurPulse.com.au  Fact Sheet for Healthcare Providers: IncredibleEmployment.be  This test is not yet approved or cleared by the [place] FDA and has been authorized for detection and/or diagnosis of SARS-CoV-2 by FDA under an Emergency Use Authorization (EUA). This EUA will remain in effect (meaning this test can be used) for the duration of the COVID-19 declaration under Section 564(b)(1) of the Act, 21 U.S.C. section 360bbb-3(b)(1), unless the authorization is  terminated or revoked.  Performed at [facility], [address] [place], [place] [address]      Radiological Exams on Admission: CT Head Wo Contrast  Result Date: 10/25/2021 CLINICAL DATA:  Trauma EXAM: CT HEAD WITHOUT CONTRAST TECHNIQUE: Contiguous axial images were obtained from the base of the skull through the vertex without intravenous contrast. RADIATION DOSE REDUCTION: This exam was performed according to the departmental dose-optimization program which includes automated exposure control, adjustment of the mA and/or kV according to patient size and/or use of iterative reconstruction technique. COMPARISON:  08/02/2021 FINDINGS: Brain: No acute intracranial findings are seen in the noncontrast CT brain. Ventricles are not dilated. Cortical sulci are prominent. Calcifications are seen in the left basal ganglia. Vascular: There are scattered arterial calcifications. Skull: No fracture is seen in the calvarium. There is interval clearing of left frontal scalp hematoma. Sinuses/Orbits: Unremarkable. Other: None IMPRESSION: No acute intracranial findings are seen in noncontrast CT brain. Atrophy. No significant interval changes are noted. Electronically Signed   By: Elmer Picker M.D.   On:  10/25/2021 17:14   DG Chest Portable 1 View  Result Date: 10/25/2021 CLINICAL DATA:  Chest pain, fall EXAM: PORTABLE CHEST 1 VIEW COMPARISON:  Portable exam 1304 hours compared to 08/02/2021 Correlation: Thyroid ultrasound 08/03/2021 FINDINGS: Upper normal heart size with normal pulmonary vascularity. Mass effect upon the RIGHT lateral aspect of the trachea with transverse tracheal narrowing and slight tracheal deviation RIGHT to LEFT, corresponding to known enlargement of RIGHT thyroid lobe containing nodules, please refer to prior ultrasound exam 08/03/2021. Atherosclerotic calcification aorta. Mild atelectasis at RIGHT base. Remaining lungs clear. No acute infiltrate, pleural effusion, or pneumothorax. Bones demineralized. Surgical clips LEFT axilla. IMPRESSION: Mild RIGHT basilar atelectasis. Known asymmetric enlargement of RIGHT thyroid lobe containing thyroid nodules as noted on prior ultrasound as above. Aortic Atherosclerosis (ICD10-I70.0). Electronically Signed   By: Lavonia Dana M.D.   On: 10/25/2021 14:33      Assessment/Plan Principal Problem:   UTI (urinary tract infection) Active Problems:   Fall at home, initial encounter   Seizures ([place])   Hypertension   Elevated troponin   Hyponatremia   HLD (hyperlipidemia)   CAD (coronary artery disease)   Chronic diastolic CHF (congestive heart failure) (HCC)   Acute renal failure superimposed on stage 3b chronic kidney disease (HCC)   Normocytic anemia   Tobacco abuse   UTI (urinary tract infection):  Patient is nonseptic.  No fever or leukocytosis. -Placed on telemetry bed of observation -IV Rocephin -Follow-up blood culture and urine culture  Fall at home, initial encounter: CT head negative -PT/OT -Fall precaution  Seizures ([place]) -Seizure precaution -As needed Ativan for seizure -Continue home Vimpat, Keppra  Hypertension -IV hydralazine as needed -Hold blood pressure medications since her blood pressure is  soft  Elevated troponin and history of CAD: No chest pain.  Troponin level 33, already treating down to 24. -Continue Lipitor -Start a low-dose aspirin, 81 mg daily -Check A1c, FLP  Hyponatremia: Sodium 129.  Likely due to dehydration and decreased oral intake -IV fluid: 2 L normal saline, followed by 100 cc/h -Hold Lasix and spironolactone  HLD (hyperlipidemia) -Lipitor  Chronic diastolic CHF (congestive heart failure) ([place]): 2D echo on 05/17/2020 showed EF 65-70%.  Patient is clinically dry, -Hold Lasix and spironolactone  Acute renal failure superimposed on stage 3b chronic kidney disease ([place]): Baseline creatinine 1.27 on 08/05/2021.  Her creatinine is at 2.24, BUN 38.  Likely due to combination of UTI and dehydration,  and continuation of diuretics -IV fluid as above -Hold Lasix, spironolactone  Normocytic anemia: Hemoglobin 10.2 on 08/03/2021.  Her creatinine is 9.5 today. -Follow-up with CBC  Tobacco abuse -Nicotine patch       DVT ppx:  SQ Lovenox  Code Status: Full code  Family Communication:  Yes, patient's sister   by phone  Disposition Plan:  Anticipate discharge back to previous environment  Consults called:  none  Admission status and Level of care: Telemetry Medical:    for obs    Severity of Illness:  The appropriate patient status for this patient is OBSERVATION. Observation status is judged to be reasonable and necessary in order to provide the required intensity of service to ensure the patient's safety. The patient's presenting symptoms, physical exam findings, and initial radiographic and laboratory data in the context of their medical condition is felt to place them at decreased risk for further clinical deterioration. Furthermore, it is anticipated that the patient will be medically stable for discharge from the hospital within 2 midnights of admission.        Date of Service 10/25/2021    [facility]   If 7PM-7AM, please  contact night-coverage [email or web address] 10/25/2021, 6:17 PM

## 2021-10-25 NOTE — Discharge Instructions (Addendum)
We recommended you stay in the hospital due to a urinary tract infection and decreased kidney function, but you declined today.  Return to the hospital if you change your mind and wish to continue treatment here.  Please be sure to drink lots of fluids to stay well-hydrated and to help flush out your urinary system.  Please fill the prescription for antibiotics and take them as prescribed to help remove the infection.  Your dose of Lasix (water pill) has been reduced for now. F/u you PCP Dr Harold Barban for lab work and post hospital discharge

## 2021-10-25 NOTE — ED Triage Notes (Signed)
Pt from home via [facility]. Per medic, pt lives by herself, pt's sister came to check on her and pt had fallen and was on the floor next to an empty bottle of expired nitro. Pt Aox4, did not want to come to ED, but medic brought pt d/t hypotension. 90/51, HR73, 95% RA, BGL 123. Pt acting lethargic, groggy and not wanting to hold self up straight. Per medic pt has "boxes and boxes" of unorganized medications with multiple types of sedatives and full bags of medications that per sister should have been discarded since they were discontinued.

## 2021-10-26 ENCOUNTER — Encounter: Payer: Self-pay | Admitting: Internal Medicine

## 2021-10-26 DIAGNOSIS — G40909 Epilepsy, unspecified, not intractable, without status epilepticus: Secondary | ICD-10-CM | POA: Diagnosis not present

## 2021-10-26 DIAGNOSIS — I2510 Atherosclerotic heart disease of native coronary artery without angina pectoris: Secondary | ICD-10-CM | POA: Diagnosis not present

## 2021-10-26 DIAGNOSIS — D631 Anemia in chronic kidney disease: Secondary | ICD-10-CM | POA: Diagnosis not present

## 2021-10-26 DIAGNOSIS — Z20822 Contact with and (suspected) exposure to covid-19: Secondary | ICD-10-CM | POA: Diagnosis not present

## 2021-10-26 DIAGNOSIS — R2681 Unsteadiness on feet: Secondary | ICD-10-CM | POA: Diagnosis not present

## 2021-10-26 DIAGNOSIS — I130 Hypertensive heart and chronic kidney disease with heart failure and stage 1 through stage 4 chronic kidney disease, or unspecified chronic kidney disease: Secondary | ICD-10-CM | POA: Diagnosis not present

## 2021-10-26 DIAGNOSIS — E871 Hypo-osmolality and hyponatremia: Secondary | ICD-10-CM | POA: Diagnosis not present

## 2021-10-26 DIAGNOSIS — E785 Hyperlipidemia, unspecified: Secondary | ICD-10-CM | POA: Diagnosis not present

## 2021-10-26 DIAGNOSIS — I252 Old myocardial infarction: Secondary | ICD-10-CM | POA: Diagnosis not present

## 2021-10-26 DIAGNOSIS — N179 Acute kidney failure, unspecified: Secondary | ICD-10-CM | POA: Diagnosis not present

## 2021-10-26 DIAGNOSIS — Z91041 Radiographic dye allergy status: Secondary | ICD-10-CM | POA: Diagnosis not present

## 2021-10-26 DIAGNOSIS — I5032 Chronic diastolic (congestive) heart failure: Secondary | ICD-10-CM | POA: Diagnosis not present

## 2021-10-26 DIAGNOSIS — F17210 Nicotine dependence, cigarettes, uncomplicated: Secondary | ICD-10-CM | POA: Diagnosis not present

## 2021-10-26 DIAGNOSIS — Z8249 Family history of ischemic heart disease and other diseases of the circulatory system: Secondary | ICD-10-CM | POA: Diagnosis not present

## 2021-10-26 DIAGNOSIS — Z853 Personal history of malignant neoplasm of breast: Secondary | ICD-10-CM | POA: Diagnosis not present

## 2021-10-26 DIAGNOSIS — N1832 Chronic kidney disease, stage 3b: Secondary | ICD-10-CM | POA: Diagnosis not present

## 2021-10-26 DIAGNOSIS — N3000 Acute cystitis without hematuria: Secondary | ICD-10-CM | POA: Diagnosis not present

## 2021-10-26 DIAGNOSIS — G9341 Metabolic encephalopathy: Secondary | ICD-10-CM | POA: Diagnosis present

## 2021-10-26 DIAGNOSIS — N390 Urinary tract infection, site not specified: Secondary | ICD-10-CM | POA: Diagnosis present

## 2021-10-26 DIAGNOSIS — E860 Dehydration: Secondary | ICD-10-CM | POA: Diagnosis not present

## 2021-10-26 DIAGNOSIS — Z79899 Other long term (current) drug therapy: Secondary | ICD-10-CM | POA: Diagnosis not present

## 2021-10-26 DIAGNOSIS — M6281 Muscle weakness (generalized): Secondary | ICD-10-CM | POA: Diagnosis not present

## 2021-10-26 LAB — LIPID PANEL
Cholesterol: 85 mg/dL (ref 0–200)
HDL: 22 mg/dL — ABNORMAL LOW (ref >40)
LDL Cholesterol: 50 mg/dL (ref 0–99)
Total CHOL/HDL Ratio: 3.9 RATIO
Triglycerides: 64 mg/dL (ref <150)
VLDL: 13 mg/dL (ref 0–40)

## 2021-10-26 LAB — BASIC METABOLIC PANEL
Anion gap: 12 (ref 5–15)
BUN: 33 mg/dL — ABNORMAL HIGH (ref 8–23)
CO2: 21 mmol/L — ABNORMAL LOW (ref 22–32)
Calcium: 8.2 mg/dL — ABNORMAL LOW (ref 8.9–10.3)
Chloride: 101 mmol/L (ref 98–111)
Creatinine, Ser: 1.58 mg/dL — ABNORMAL HIGH (ref 0.44–1.00)
GFR, Estimated: 35 mL/min — ABNORMAL LOW (ref >60)
Glucose, Bld: 93 mg/dL (ref 70–99)
Potassium: 3.6 mmol/L (ref 3.5–5.1)
Sodium: 134 mmol/L — ABNORMAL LOW (ref 135–145)

## 2021-10-26 LAB — HIV ANTIBODY (ROUTINE TESTING W REFLEX): HIV Screen 4th Generation wRfx: NONREACTIVE

## 2021-10-26 MED ORDER — LEVETIRACETAM 500 MG PO TABS
500.0000 mg | ORAL_TABLET | Freq: Two times a day (BID) | ORAL | Status: DC
  Administered 2021-10-26 – 2021-10-27 (×2): 500 mg via ORAL
  Filled 2021-10-26 (×2): qty 1

## 2021-10-26 MED ORDER — LOPERAMIDE HCL 2 MG PO CAPS
2.0000 mg | ORAL_CAPSULE | Freq: Four times a day (QID) | ORAL | Status: DC | PRN
Start: 2021-10-26 — End: 2021-10-27
  Administered 2021-10-26: 2 mg via ORAL
  Filled 2021-10-26: qty 1

## 2021-10-26 NOTE — Evaluation (Signed)
Physical Therapy Evaluation Patient Details Name: Anne Phillips MRN: [id number] DOB: 1951/01/22 Today's Date: 10/26/2021  History of Present Illness  Patient is a 71 year old with a PMH(+) for CAD, non-STEMI, diastolic heart failure, seizures, hypertension who comes ED due to a fall at home. Patient was admitted for a UTI.   Clinical Impression  Physical Therapy Evaluation completed on this date. Patient tolerated session well and was agreeable to treatment. Upon entry into room patient was supine in bed resting. Patient reported no pain throughout session, and was A&Ox3. Per patient she was Mod I with all mobility and ADLs, and utilizes her RW at home when she needs to. She lives with her sister in a 1 story home with 3-4 steps to enter. Patient was able to demonstrate bed mobility at supervision with increased time and no physical assistance needed. Sit to stand transfers and ambulation was completed at SBA/supervision for safety. Patient required verbal cueing for obstacle negotiation, however no LOB was noted. Patient was able to tolerate 2 laps around the nurses station with no standing rest breaks for fatigue. Patient would continue to benefit from skilled physical therapy in order to optimize patient's return to PLOF. Recommend HHPT upon discharge to work on overall unsteadiness on feet and generalized muscle weakness to increase safety and decrease fall risk with functional ADLs and mobility.      Recommendations for follow up therapy are one component of a multi-disciplinary discharge planning process, led by the attending physician.  Recommendations may be updated based on patient status, additional functional criteria and insurance authorization.  Follow Up Recommendations Home health PT    Assistance Recommended at Discharge Intermittent Supervision/Assistance  Patient can return home with the following  Assistance with cooking/housework;Assistance with feeding;Direct  supervision/assist for financial management;Direct supervision/assist for medications management;Assist for transportation    Equipment Recommendations None recommended by PT (at this time)  Recommendations for Other Services       Functional Status Assessment Patient has had a recent decline in their functional status and demonstrates the ability to make significant improvements in function in a reasonable and predictable amount of time.     Precautions / Restrictions Precautions Precautions: Fall Restrictions Weight Bearing Restrictions: No      Mobility  Bed Mobility Overal bed mobility: Needs Assistance Bed Mobility: Sit to Supine, Supine to Sit     Supine to sit: Supervision Sit to supine: Supervision   General bed mobility comments: increased time to complete    Transfers Overall transfer level: Needs assistance Equipment used: Rolling walker (2 wheels) Transfers: Sit to/from Stand                  Ambulation/Gait Ambulation/Gait assistance: Supervision Gait Distance (Feet): 250 Feet Assistive device: Rolling walker (2 wheels) Gait Pattern/deviations: Step-through pattern, Decreased step length - right, Decreased step length - left, Narrow base of support Gait velocity: mildly decreased     General Gait Details: patient required cueing for directional and some obstacle negotiation, SBA/SUP was required for safety, no LOB noted  Stairs            Wheelchair Mobility    Modified Rankin (Stroke Patients Only)       Balance Overall balance assessment: Needs assistance Sitting-balance support: Bilateral upper extremity supported, Feet supported Sitting balance-Leahy Scale: Good     Standing balance support: Bilateral upper extremity supported, During functional activity, Reliant on assistive device for balance Standing balance-Leahy Scale: Fair  Pertinent Vitals/Pain Pain Assessment Pain  Assessment: No/denies pain    Home Living Family/patient expects to be discharged to:: Private residence Living Arrangements: Other relatives (sister) Available Help at Discharge: Family Type of Home: House Home Access: Stairs to enter Entrance Stairs-Rails: Left Entrance Stairs-Number of Steps: 3-4   Home Layout: One level Home Equipment: Conservation officer, nature (2 wheels);Cane - single point;Tub bench Additional Comments: intermittent use of assitive device 'when (she) needs it."    Prior Function Prior Level of Function : Needs assist       Physical Assist : ADLs (physical)     Mobility Comments: Reports being Ind with ambulation without AD; intermittent use of RW when needed.       Hand Dominance        Extremity/Trunk Assessment   Upper Extremity Assessment Upper Extremity Assessment: Generalized weakness    Lower Extremity Assessment Lower Extremity Assessment: Generalized weakness (at least 3/5 strength)       Communication   Communication: No difficulties  Cognition Arousal/Alertness: Awake/alert Behavior During Therapy: WFL for tasks assessed/performed Overall Cognitive Status: No family/caregiver present to determine baseline cognitive functioning                                 General Comments: A&Ox2- self and location, disorented to situation        General Comments      Exercises Other Exercises Other Exercises: patient educated on role of PT in acute care setting   Assessment/Plan    PT Assessment Patient needs continued PT services  PT Problem List Decreased strength;Decreased mobility;Decreased balance       PT Treatment Interventions DME instruction;Functional mobility training;Balance training;Gait training;Therapeutic activities;Therapeutic exercise;Patient/family education    PT Goals (Current goals can be found in the Care Plan section)  Acute Rehab PT Goals Patient Stated Goal: to go home PT Goal Formulation: With  patient Time For Goal Achievement: 11/09/21 Potential to Achieve Goals: Good    Frequency Min 2X/week     Co-evaluation               AM-PAC PT "6 Clicks" Mobility  Outcome Measure Help needed turning from your back to your side while in a flat bed without using bedrails?: A Little Help needed moving from lying on your back to sitting on the side of a flat bed without using bedrails?: A Little Help needed moving to and from a bed to a chair (including a wheelchair)?: A Little Help needed standing up from a chair using your arms (e.g., wheelchair or bedside chair)?: A Little Help needed to walk in hospital room?: A Little Help needed climbing 3-5 steps with a railing? : A Little 6 Click Score: 18    End of Session Equipment Utilized During Treatment: Gait belt Activity Tolerance: Patient tolerated treatment well Patient left: in bed;with call bell/phone within reach;with bed alarm set Nurse Communication: Mobility status PT Visit Diagnosis: Muscle weakness (generalized) (M62.81);Unsteadiness on feet (R26.81)    Time: 9357-0177 PT Time Calculation (min) (ACUTE ONLY): 22 min   Charges:   PT Evaluation $PT Eval Low Complexity: 1 Low          Iva Boop, PT  10/26/21. 3:43 PM

## 2021-10-26 NOTE — Progress Notes (Signed)
[facility] NAME: Anne Phillips    MR#:  [id number]  DATE OF BIRTH:  1951/06/16  SUBJECTIVE:  patient came in after she was confused and increase urinary frequency but a fall at home. Did not have any injury. No fever. She is insisting at once a go home. No family at bedside Per RN patient is having six frequent episodes of mushy stools. No vomiting. valuation   VITALS:  Blood pressure (!) 107/47, pulse 70, temperature 97.7 F (36.5 C), temperature source Oral, resp. rate 17, height 5\' 2"  (1.575 m), weight 57.2 kg, SpO2 100 %.  PHYSICAL EXAMINATION:   GENERAL:  71 y.o.-year-old patient lying in the bed with no acute distress.  LUNGS: Normal breath sounds bilaterally, no wheezing, rales, rhonchi.  CARDIOVASCULAR: S1, S2 normal. No murmurs, rubs, or gallops.  ABDOMEN: Soft, nontender, nondistended. Bowel sounds present.  EXTREMITIES: No  edema b/l.    NEUROLOGIC: nonfocal  patient is alert and awake SKIN: No obvious rash, lesion, or ulcer.   LABORATORY PANEL:  CBC Recent Labs  Lab 10/25/21 1242  WBC 7.1  HGB 9.5*  HCT 29.2*  PLT 134*    Chemistries  Recent Labs  Lab 10/25/21 1242 10/26/21 0518  NA 129* 134*  K 3.8 3.6  CL 100 101  CO2 20* 21*  GLUCOSE 111* 93  BUN 38* 33*  CREATININE 2.24* 1.58*  CALCIUM 7.9* 8.2*  AST 18  --   ALT 10  --   ALKPHOS 86  --   BILITOT 1.0  --    Cardiac Enzymes No results for input(s): TROPONINI in the last 168 hours. RADIOLOGY:  CT Head Wo Contrast  Result Date: 10/25/2021 CLINICAL DATA:  Trauma EXAM: CT HEAD WITHOUT CONTRAST TECHNIQUE: Contiguous axial images were obtained from the base of the skull through the vertex without intravenous contrast. RADIATION DOSE REDUCTION: This exam was performed according to the departmental dose-optimization program which includes automated exposure control, adjustment of the mA and/or kV according to patient size and/or use of iterative  reconstruction technique. COMPARISON:  08/02/2021 FINDINGS: Brain: No acute intracranial findings are seen in the noncontrast CT brain. Ventricles are not dilated. Cortical sulci are prominent. Calcifications are seen in the left basal ganglia. Vascular: There are scattered arterial calcifications. Skull: No fracture is seen in the calvarium. There is interval clearing of left frontal scalp hematoma. Sinuses/Orbits: Unremarkable. Other: None IMPRESSION: No acute intracranial findings are seen in noncontrast CT brain. Atrophy. No significant interval changes are noted. Electronically Signed   By: Elmer Picker M.D.   On: 10/25/2021 17:14   DG Chest Portable 1 View  Result Date: 10/25/2021 CLINICAL DATA:  Chest pain, fall EXAM: PORTABLE CHEST 1 VIEW COMPARISON:  Portable exam 1304 hours compared to 08/02/2021 Correlation: Thyroid ultrasound 08/03/2021 FINDINGS: Upper normal heart size with normal pulmonary vascularity. Mass effect upon the RIGHT lateral aspect of the trachea with transverse tracheal narrowing and slight tracheal deviation RIGHT to LEFT, corresponding to known enlargement of RIGHT thyroid lobe containing nodules, please refer to prior ultrasound exam 08/03/2021. Atherosclerotic calcification aorta. Mild atelectasis at RIGHT base. Remaining lungs clear. No acute infiltrate, pleural effusion, or pneumothorax. Bones demineralized. Surgical clips LEFT axilla. IMPRESSION: Mild RIGHT basilar atelectasis. Known asymmetric enlargement of RIGHT thyroid lobe containing thyroid nodules as noted on prior ultrasound as above. Aortic Atherosclerosis (ICD10-I70.0). Electronically Signed   By: Lavonia Dana M.D.   On: 10/25/2021 14:33    Assessment  and Plan   Anne Phillips is a 71 y.o. female with medical history significant of seizure, hypertension, hyperlipidemia, CAD, breast cancer (mastectomy), dCHF, CKD stage IIIb, hyponatremia, who presents with altered mental status, fall, increased urinary  frequency.  Acute on chronic Renal failure stage IIIB /dehydration/hyponatremia -- baseline creatinine 1.27 -- came in with creatinine of 2.24 -- came in with sodium of 129--- 134 -- continue IV hydration -- hold Lasix and Spironolactone for now  UTI -- IV antibiotics -- change to oral tomorrow -- complains of frequency of urination  generalized weakness with fall -- CT head negative -- PT OT to see patient  history of chronic seizure -- continue impact on Keppra  Hyperlipidemia continue statins  TOC for discharge planning   Procedures: Family communication : sister [name] Consults : none CODE STATUS: full DVT Prophylaxis : enoxaparin Level of care: Med-Surg Status is: Inpatient Remains inpatient appropriate because: UTI, AK I, weakness  anticipate discharge 1 to 2 days            TOTAL TIME TAKING CARE OF THIS PATIENT: 25 minutes.  >50% time spent on counselling and coordination of care  Note: This dictation was prepared with Dragon dictation along with smaller phrase technology. Any transcriptional errors that result from this process are unintentional.  Fritzi Mandes M.D    [facility]   CC: Primary care physician; [place]

## 2021-10-26 NOTE — Evaluation (Signed)
Occupational Therapy Evaluation Patient Details Name: Anne Phillips MRN: [id number] DOB: 01/25/1951 Today's Date: 10/26/2021   History of Present Illness Patient is a 71 year old with a PMH(+) for CAD, non-STEMI, diastolic heart failure, seizures, hypertension who comes ED due to a fall at home. Patient was admitted for a UTI.   Clinical Impression   Anne Phillips presents with generalized weakness, limited endurance, mild confusion, and impaired balance. Pt shares a home with her sister; she reports that she is IND in ADL, has had 2 falls in previous 6 months (both resulting in hospitalizations, including current admission), that she and her sister share cooking and cleaning responsibilities. Sister does shopping/driving; pt no longer drives. During today's evaluation, pt is able to transfer supine to sitting with SUPV, transfers from bed to recliner with RW and SUPV, with cueing for safe hand placement on RW, requires Min A and cueing for transferring back to bed, as she attempts to go directly from standing to supine, skipping the sitting step while demonstrating impulsivity and poor safety awareness. Pt reports that she uses RW "sometimes." Provided educ re: value of using RW on a consistent basis, but pt does not endorse agreement. Recommend ongoing OT while hospitalized to address safety concerns, falls prevention, educ re: safe use of DME. Pt could also benefit from [facility] post DC, although she states that she is not interested in this option, saying that she walks daily with a niece or nephew and does not need someone else coming into her home.    Recommendations for follow up therapy are one component of a multi-disciplinary discharge planning process, led by the attending physician.  Recommendations may be updated based on patient status, additional functional criteria and insurance authorization.   Follow Up Recommendations  Home health OT    Assistance Recommended at Discharge  Intermittent Supervision/Assistance  Patient can return home with the following A little help with walking and/or transfers;A little help with bathing/dressing/bathroom;Assist for transportation;Assistance with cooking/housework    Functional Status Assessment  Patient has had a recent decline in their functional status and demonstrates the ability to make significant improvements in function in a reasonable and predictable amount of time.  Equipment Recommendations  None recommended by OT    Recommendations for Other Services       Precautions / Restrictions Precautions Precautions: Fall Restrictions Weight Bearing Restrictions: No      Mobility Bed Mobility Overal bed mobility: Needs Assistance Bed Mobility: Sit to Supine, Supine to Sit     Supine to sit: Supervision Sit to supine: Min assist   General bed mobility comments: requires repeated cueing for safe performance    Transfers Overall transfer level: Needs assistance Equipment used: Rolling walker (2 wheels) Transfers: Sit to/from Stand, Bed to chair/wheelchair/BSC Sit to Stand: Supervision     Step pivot transfers: Min assist     General transfer comment: Cueing for staying with RW while transferring      Balance Overall balance assessment: Needs assistance Sitting-balance support: Bilateral upper extremity supported, Feet supported Sitting balance-Leahy Scale: Good     Standing balance support: Bilateral upper extremity supported, During functional activity, Reliant on assistive device for balance Standing balance-Leahy Scale: Fair                             ADL either performed or assessed with clinical judgement   ADL Overall ADL's : Needs assistance/impaired  General ADL Comments: Anticipate SUPV-Min A for OOB fxl mobility     Vision   Additional Comments: Pt reports she used to love to read but that this has become  difficult for her in recent years, 2/2 declining vision     Perception     Praxis      Pertinent Vitals/Pain Pain Assessment Pain Assessment: No/denies pain     Hand Dominance Right   Extremity/Trunk Assessment Upper Extremity Assessment Upper Extremity Assessment: Generalized weakness   Lower Extremity Assessment Lower Extremity Assessment: Generalized weakness   Cervical / Trunk Assessment Cervical / Trunk Assessment: Normal   Communication Communication Communication: No difficulties   Cognition Arousal/Alertness: Awake/alert Behavior During Therapy: WFL for tasks assessed/performed Overall Cognitive Status: No family/caregiver present to determine baseline cognitive functioning                                 General Comments: Pt oriented to location, situation, time, but engages in frequent conversation, displays limited safety awareness     General Comments       Exercises Other Exercises Other Exercises: Educ re: falls prevention, safe use of RW, safe transfers, options for continued reading with impaired vision   Shoulder Instructions      Home Living Family/patient expects to be discharged to:: Private residence Living Arrangements: Other relatives;Other (Comment) (pt lives w/ sister; reports nieces and nephews visit frequently) Available Help at Discharge: Family Type of Home: House Home Access: Stairs to enter Technical brewer of Steps: 3-4 Entrance Stairs-Rails: Left Home Layout: One level     Bathroom Shower/Tub: Teacher, early years/pre: Standard     Home Equipment: Conservation officer, nature (2 wheels);Cane - single point;Tub bench   Additional Comments: intermittent use of assitive device 'when (she) needs it."      Prior Functioning/Environment Prior Level of Function : Needs assist       Physical Assist : ADLs (physical)     Mobility Comments: Reports being Ind with ambulation without AD; intermittent use of RW  when needed. ADLs Comments: Reports being Ind with ADL's and light IADL's; sister assist with meals/cooking and transportation.        OT Problem List: Decreased strength;Impaired balance (sitting and/or standing);Decreased knowledge of use of DME or AE;Decreased activity tolerance;Impaired vision/perception      OT Treatment/Interventions: Self-care/ADL training;Therapeutic exercise;Patient/family education;Neuromuscular education;Energy conservation;DME and/or AE instruction;Therapeutic activities    OT Goals(Current goals can be found in the care plan section) Acute Rehab OT Goals Patient Stated Goal: to not fall again OT Goal Formulation: With patient Time For Goal Achievement: 11/09/21 Potential to Achieve Goals: Good ADL Goals Pt Will Transfer to Toilet: with modified independence;regular height toilet Pt/caregiver will Perform Home Exercise Program: Increased ROM;Increased strength;With Supervision Additional ADL Goal #1: Pt will demonstrate safe and consistent use of RW  OT Frequency: Min 2X/week    Co-evaluation              AM-PAC OT "6 Clicks" Daily Activity     Outcome Measure Help from another person eating meals?: None Help from another person taking care of personal grooming?: A Little Help from another person toileting, which includes using toliet, bedpan, or urinal?: A Little Help from another person bathing (including washing, rinsing, drying)?: A Little Help from another person to put on and taking off regular upper body clothing?: None Help from another person to put on and taking off regular  lower body clothing?: A Little 6 Click Score: 20   End of Session Equipment Utilized During Treatment: Rolling walker (2 wheels)  Activity Tolerance: Patient tolerated treatment well Patient left: in bed;with call bell/phone within reach;with chair alarm set  OT Visit Diagnosis: Unsteadiness on feet (R26.81);Muscle weakness (generalized) (M62.81)                 Time: 3601-6580 OT Time Calculation (min): 30 min Charges:  OT General Charges $OT Visit: 1 Visit OT Evaluation $OT Eval Moderate Complexity: 1 Mod OT Treatments $Self Care/Home Management : 23-37 mins Josiah Lobo, PhD, MS, OTR/L 10/26/21, 4:08 PM

## 2021-10-27 DIAGNOSIS — N179 Acute kidney failure, unspecified: Secondary | ICD-10-CM | POA: Diagnosis not present

## 2021-10-27 DIAGNOSIS — N390 Urinary tract infection, site not specified: Secondary | ICD-10-CM | POA: Diagnosis not present

## 2021-10-27 LAB — BASIC METABOLIC PANEL
Anion gap: 8 (ref 5–15)
BUN: 22 mg/dL (ref 8–23)
CO2: 20 mmol/L — ABNORMAL LOW (ref 22–32)
Calcium: 7.9 mg/dL — ABNORMAL LOW (ref 8.9–10.3)
Chloride: 106 mmol/L (ref 98–111)
Creatinine, Ser: 1.35 mg/dL — ABNORMAL HIGH (ref 0.44–1.00)
GFR, Estimated: 43 mL/min — ABNORMAL LOW (ref >60)
Glucose, Bld: 85 mg/dL (ref 70–99)
Potassium: 3.2 mmol/L — ABNORMAL LOW (ref 3.5–5.1)
Sodium: 134 mmol/L — ABNORMAL LOW (ref 135–145)

## 2021-10-27 MED ORDER — FUROSEMIDE 20 MG PO TABS: 20.0000 mg | ORAL_TABLET | Freq: Every day | ORAL | 0 refills | Status: DC

## 2021-10-27 MED ORDER — CEPHALEXIN 500 MG PO CAPS: 500.0000 mg | ORAL_CAPSULE | Freq: Three times a day (TID) | ORAL | 0 refills | Status: AC

## 2021-10-27 MED ORDER — CEPHALEXIN 500 MG PO CAPS: 500.0000 mg | ORAL_CAPSULE | Freq: Three times a day (TID) | ORAL | Status: DC

## 2021-10-27 MED ORDER — POTASSIUM CHLORIDE 20 MEQ PO PACK
40.0000 meq | PACK | Freq: Once | ORAL | Status: DC
  Filled 2021-10-27: qty 2

## 2021-10-27 MED ORDER — CEPHALEXIN 500 MG CAPSULE
ORAL_CAPSULE | 0 refills | 0 days
Start: 2021-10-27 — End: ?

## 2021-10-27 MED ORDER — FUROSEMIDE 20 MG TABLET
ORAL_TABLET | Freq: Every day | ORAL | 0 refills | 30 days
Start: 2021-10-27 — End: ?

## 2021-10-27 NOTE — Progress Notes (Signed)
Anne Phillips to be D/C'd Home per MD order.  Discussed prescriptions and follow up appointments with the patient. Prescriptions given to patient, medication list explained in detail. Pt verbalized understanding.  Allergies as of 10/27/2021       Reactions   Iodinated Contrast Media Hives, Other (See Comments)   Rash         Medication List     STOP taking these medications    amLODipine 10 MG tablet Commonly known as: NORVASC       TAKE these medications    acetaminophen 325 MG tablet Commonly known as: TYLENOL Take 2 tablets (650 mg total) by mouth every 6 (six) hours as needed for mild pain (or Fever >/= 101).   albuterol 108 (90 Base) MCG/ACT inhaler Commonly known as: VENTOLIN HFA Inhale 2 puffs into the lungs every 6 (six) hours as needed for shortness of breath or wheezing.   atorvastatin 40 MG tablet Commonly known as: LIPITOR Take 40 mg by mouth daily. Notes to patient: Dose given 10/27/21   carvedilol 6.25 MG tablet Commonly known as: COREG Take 1 tablet (6.25 mg total) by mouth 2 (two) times daily with a meal. What changed: how much to take   cephALEXin 500 MG capsule Commonly known as: KEFLEX Take 1 capsule (500 mg total) by mouth every 8 (eight) hours for 6 days.   clonazePAM 1 MG tablet Commonly known as: KLONOPIN Take 1 mg by mouth 2 (two) times daily as needed for anxiety. What changed: Another medication with the same name was removed. Continue taking this medication, and follow the directions you see here.   cyanocobalamin 1000 MCG tablet Take 1 tablet (1,000 mcg total) by mouth daily. Notes to patient: Dose given 10/27/21   empagliflozin 10 MG Tabs tablet Commonly known as: JARDIANCE Take 10 mg by mouth daily.   furosemide 20 MG tablet Commonly known as: LASIX Take 1 tablet (20 mg total) by mouth daily. What changed:  medication strength how much to take   gabapentin 300 MG capsule Commonly known as: NEURONTIN Take 1 capsule (300  mg total) by mouth 2 (two) times daily.   Lacosamide 150 MG Tabs Commonly known as: Vimpat Take 1 tablet (150 mg total) by mouth in the morning and at bedtime.   levETIRAcetam 500 MG tablet Commonly known as: KEPPRA Take 500 mg by mouth 3 (three) times daily.   spironolactone 25 MG tablet Commonly known as: ALDACTONE Take 12.5 mg by mouth daily.        Vitals:   10/27/21 0425 10/27/21 0731  BP:  (!) 117/51  Pulse: 71 66  Resp:  18  Temp:  98.6 F (37 C)  SpO2: 99% 96%    Skin clean, dry and intact without evidence of skin break down, no evidence of skin tears noted. IV catheter discontinued intact. Site without signs and symptoms of complications. Dressing and pressure applied. Pt denies pain at this time. No complaints noted.  An After Visit Summary was printed and given to the patient. Patient escorted via [place], and D/C home via private auto.  Fuller Mandril, RN

## 2021-10-27 NOTE — Discharge Summary (Signed)
Physician Discharge Summary   Patient: Anne Phillips MRN: [id number] DOB: 20-Oct-1950  Admit date:     10/25/2021  Discharge date: 10/27/21  Discharge Physician: Fritzi Mandes   PCP: Azucena Cecil, MD   Recommendations at discharge:   patient recommended to complete her antibiotic course for UTI follow-up [facility] Dr. Harold Barban in 1 to 2 weeks patient's Lasix has been reduced to 20 mg daily due to her elevated creatinine. Follow-up metabolic panel in 1 to 2 weeks with PCP  Discharge Diagnoses: acute on chronic renal failure stage IIIB secondary to dehydration with hyponatremia-- improved UTI  Hospital Course:   Anne Phillips is a 71 y.o. female with medical history significant of seizure, hypertension, hyperlipidemia, CAD, breast cancer (mastectomy), dCHF, CKD stage IIIb, hyponatremia, who presents with altered mental status, fall, increased urinary frequency.   Acute on chronic Renal failure stage IIIB /dehydration/hyponatremia -- baseline creatinine 1.27 -- came in with creatinine of 2.24--- 1.35 -- came in with sodium of 129--- 134 -- continue IV hydration -- held Lasix and Spironolactone --- will discharge patient on 20 mg Lasix and continue 12.5 mg of Aldactone. Patient will need close follow-up with metabolic panel as outpatient with PCP at [facility]   UTI -- IV rocephin -- change to oral keflex -- complains of frequency of urination   generalized weakness with fall -- CT head negative -- PT OT --recommends HHPT/OT   history of chronic seizure -- continue impact on Keppra   Hyperlipidemia continue statins   overall hemodynamically stable will discharge patient to home she is in agreement with it.  Discharge plan discussed with patient's sister Anne Phillips on the phone. She is in agreement with plan.     Consultants: none Procedures performed: none Disposition: Home health Diet recommendation:  Discharge Diet Orders (From admission, onward)     Start     Ordered    10/27/21 0000  Diet - low sodium heart healthy        10/27/21 0842           Cardiac and Carb modified diet  DISCHARGE MEDICATION: Allergies as of 10/27/2021       Reactions   Iodinated Contrast Media Hives, Other (See Comments)   Rash         Medication List     STOP taking these medications    amLODipine 10 MG tablet Commonly known as: NORVASC       TAKE these medications    acetaminophen 325 MG tablet Commonly known as: TYLENOL Take 2 tablets (650 mg total) by mouth every 6 (six) hours as needed for mild pain (or Fever >/= 101).   albuterol 108 (90 Base) MCG/ACT inhaler Commonly known as: VENTOLIN HFA Inhale 2 puffs into the lungs every 6 (six) hours as needed for shortness of breath or wheezing.   atorvastatin 40 MG tablet Commonly known as: LIPITOR Take 40 mg by mouth daily.   carvedilol 6.25 MG tablet Commonly known as: COREG Take 1 tablet (6.25 mg total) by mouth 2 (two) times daily with a meal. What changed: how much to take   cephALEXin 500 MG capsule Commonly known as: KEFLEX Take 1 capsule (500 mg total) by mouth every 8 (eight) hours for 6 days.   clonazePAM 1 MG tablet Commonly known as: KLONOPIN Take 1 mg by mouth 2 (two) times daily as needed for anxiety. What changed: Another medication with the same name was removed. Continue taking this medication, and follow the directions you see here.  cyanocobalamin 1000 MCG tablet Take 1 tablet (1,000 mcg total) by mouth daily.   empagliflozin 10 MG Tabs tablet Commonly known as: JARDIANCE Take 10 mg by mouth daily.   furosemide 20 MG tablet Commonly known as: LASIX Take 1 tablet (20 mg total) by mouth daily. What changed:  medication strength how much to take   gabapentin 300 MG capsule Commonly known as: NEURONTIN Take 1 capsule (300 mg total) by mouth 2 (two) times daily.   Lacosamide 150 MG Tabs Commonly known as: Vimpat Take 1 tablet (150 mg total) by mouth in the morning and  at bedtime.   levETIRAcetam 500 MG tablet Commonly known as: KEPPRA Take 500 mg by mouth 3 (three) times daily.   spironolactone 25 MG tablet Commonly known as: ALDACTONE Take 12.5 mg by mouth daily.        Follow-up Information     [facility] EMERGENCY DEPARTMENT .   Specialty: Emergency Medicine Why: If symptoms worsen or if you change your mind and with to continue treatment Contact information: [place] [phone] [place]        Azucena Cecil, MD. Call in 1 week(s).   Specialty: Family Medicine Why: pt/family to call and make f/u appt Contact information: [place] [address] [phone]                 Discharge Exam: Danley Danker Weights   10/25/21 1238 10/26/21 0505 10/27/21 0300  Weight: 61.2 kg 57.2 kg 57.3 kg     Condition at discharge: fair  The results of significant diagnostics from this hospitalization (including imaging, microbiology, ancillary and laboratory) are listed below for reference.   Imaging Studies: CT Head Wo Contrast  Result Date: 10/25/2021 CLINICAL DATA:  Trauma EXAM: CT HEAD WITHOUT CONTRAST TECHNIQUE: Contiguous axial images were obtained from the base of the skull through the vertex without intravenous contrast. RADIATION DOSE REDUCTION: This exam was performed according to the departmental dose-optimization program which includes automated exposure control, adjustment of the mA and/or kV according to patient size and/or use of iterative reconstruction technique. COMPARISON:  08/02/2021 FINDINGS: Brain: No acute intracranial findings are seen in the noncontrast CT brain. Ventricles are not dilated. Cortical sulci are prominent. Calcifications are seen in the left basal ganglia. Vascular: There are scattered arterial calcifications. Skull: No fracture is seen in the calvarium. There is interval clearing of left frontal scalp hematoma.  Sinuses/Orbits: Unremarkable. Other: None IMPRESSION: No acute intracranial findings are seen in noncontrast CT brain. Atrophy. No significant interval changes are noted. Electronically Signed   By: Elmer Picker M.D.   On: 10/25/2021 17:14   DG Chest Portable 1 View  Result Date: 10/25/2021 CLINICAL DATA:  Chest pain, fall EXAM: PORTABLE CHEST 1 VIEW COMPARISON:  Portable exam 1304 hours compared to 08/02/2021 Correlation: Thyroid ultrasound 08/03/2021 FINDINGS: Upper normal heart size with normal pulmonary vascularity. Mass effect upon the RIGHT lateral aspect of the trachea with transverse tracheal narrowing and slight tracheal deviation RIGHT to LEFT, corresponding to known enlargement of RIGHT thyroid lobe containing nodules, please refer to prior ultrasound exam 08/03/2021. Atherosclerotic calcification aorta. Mild atelectasis at RIGHT base. Remaining lungs clear. No acute infiltrate, pleural effusion, or pneumothorax. Bones demineralized. Surgical clips LEFT axilla. IMPRESSION: Mild RIGHT basilar atelectasis. Known asymmetric enlargement of RIGHT thyroid lobe containing thyroid nodules as noted on prior ultrasound as above. Aortic Atherosclerosis (ICD10-I70.0). Electronically Signed   By: Crist Infante.D.  On: 10/25/2021 14:33    Microbiology: Results for orders placed or performed during the hospital encounter of 10/25/21  Resp Panel by RT-PCR (Flu A&B, Covid) Nasopharyngeal Swab     Status: None   Collection Time: 10/25/21 12:42 PM   Specimen: Nasopharyngeal Swab; Nasopharyngeal(NP) swabs in vial transport medium  Result Value Ref Range Status   SARS Coronavirus 2 by RT PCR NEGATIVE NEGATIVE Final    Comment: (NOTE) SARS-CoV-2 target nucleic acids are NOT DETECTED.  The SARS-CoV-2 RNA is generally detectable in upper respiratory specimens during the acute phase of infection. The lowest concentration of SARS-CoV-2 viral copies this assay can detect is 138 copies/mL. A negative  result does not preclude SARS-Cov-2 infection and should not be used as the sole basis for treatment or other patient management decisions. A negative result may occur with  improper specimen collection/handling, submission of specimen other than nasopharyngeal swab, presence of viral mutation(s) within the areas targeted by this assay, and inadequate number of viral copies(<138 copies/mL). A negative result must be combined with clinical observations, patient history, and epidemiological information. The expected result is Negative.  Fact Sheet for Patients:  EntrepreneurPulse.com.au  Fact Sheet for Healthcare Providers:  IncredibleEmployment.be  This test is no t yet approved or cleared by the [place] FDA and  has been authorized for detection and/or diagnosis of SARS-CoV-2 by FDA under an Emergency Use Authorization (EUA). This EUA will remain  in effect (meaning this test can be used) for the duration of the COVID-19 declaration under Section 564(b)(1) of the Act, 21 U.S.C.section 360bbb-3(b)(1), unless the authorization is terminated  or revoked sooner.       Influenza A by PCR NEGATIVE NEGATIVE Final   Influenza B by PCR NEGATIVE NEGATIVE Final    Comment: (NOTE) The Xpert Xpress SARS-CoV-2/FLU/RSV plus assay is intended as an aid in the diagnosis of influenza from Nasopharyngeal swab specimens and should not be used as a sole basis for treatment. Nasal washings and aspirates are unacceptable for Xpert Xpress SARS-CoV-2/FLU/RSV testing.  Fact Sheet for Patients: EntrepreneurPulse.com.au  Fact Sheet for Healthcare Providers: IncredibleEmployment.be  This test is not yet approved or cleared by the [place] FDA and has been authorized for detection and/or diagnosis of SARS-CoV-2 by FDA under an Emergency Use Authorization (EUA). This EUA will remain in effect (meaning this test can be used)  for the duration of the COVID-19 declaration under Section 564(b)(1) of the Act, 21 U.S.C. section 360bbb-3(b)(1), unless the authorization is terminated or revoked.  Performed at [facility], [address] [place], [place] [address]   Urine Culture     Status: Abnormal (Preliminary result)   Collection Time: 10/25/21  2:22 PM   Specimen: Urine, Random  Result Value Ref Range Status   Specimen Description   Final    URINE, RANDOM Performed at [facility], [address] [place], [place] [address]    Special Requests   Final    NONE Performed at [facility], [address] [place], [place] [address]    Culture (A)  Final    >=100,000 COLONIES/mL Lonell Grandchild NEGATIVE RODS SUSCEPTIBILITIES TO FOLLOW Performed at [facility], [place] [address] [place], [place] [address]    Report Status PENDING  Incomplete  Blood Culture (routine x 2)     Status: None (Preliminary result)   Collection Time: 10/25/21  2:27 PM   Specimen: BLOOD  Result Value Ref Range Status   Specimen Description BLOOD RAC  Final   Special Requests BOTTLES DRAWN AEROBIC AND ANAEROBIC BCAV  Final   Culture   Final    NO GROWTH 2 DAYS Performed at [facility], [address] [place], [place] [address]    Report Status PENDING  Incomplete  Blood Culture (routine x 2)     Status: None (Preliminary result)   Collection Time: 10/25/21  2:27 PM   Specimen: BLOOD  Result Value Ref Range Status   Specimen Description BLOOD [facility]  Final   Special Requests BOTTLES DRAWN AEROBIC AND ANAEROBIC BCAV  Final   Culture   Final    NO GROWTH 2 DAYS Performed at [facility], [address] [place], [place] [address]    Report Status PENDING  Incomplete    Labs: CBC: Recent Labs  Lab 10/25/21 1242  WBC 7.1  NEUTROABS 5.7  HGB 9.5*  HCT 29.2*  MCV 89.6  PLT 258*   Basic Metabolic Panel: Recent Labs  Lab 10/25/21 1242 10/26/21 0518 10/27/21 0455  NA 129*  134* 134*  K 3.8 3.6 3.2*  CL 100 101 106  CO2 20* 21* 20*  GLUCOSE 111* 93 85  BUN 38* 33* 22  CREATININE 2.24* 1.58* 1.35*  CALCIUM 7.9* 8.2* 7.9*   Liver Function Tests: Recent Labs  Lab 10/25/21 1242  AST 18  ALT 10  ALKPHOS 86  BILITOT 1.0  PROT 7.6  ALBUMIN 3.6   CBG: No results for input(s): GLUCAP in the last 168 hours.  Discharge time spent: greater than 30 minutes.  Signed: Fritzi Mandes, MD [facility] 10/27/2021

## 2021-10-27 NOTE — TOC Progression Note (Signed)
Transition of Care [facility]) - Progression Note    Patient Details  Name: Anne Phillips MRN: [id number] Date of Birth: July 08, 1951  Transition of Care [facility]) CM/SW [place], [place] Phone Number: 10/27/2021, 9:38 AM  Clinical Narrative:   Patient declined home health services. Patient stated her sister will pick her up today.          Expected Discharge Plan and Services           Expected Discharge Date: 10/27/21                                     Social Determinants of Health (SDOH) Interventions    Readmission Risk Interventions No flowsheet data found.

## 2021-10-28 LAB — URINE CULTURE: Culture: >=100000 — AB

## 2021-10-30 LAB — CULTURE, BLOOD (ROUTINE X 2)
Culture: NO GROWTH
Culture: NO GROWTH

## 2021-11-01 MED FILL — FUROSEMIDE 20 MG TABLET: ORAL | 30 days supply | Qty: 30 | Fill #0

## 2021-11-01 MED FILL — CEPHALEXIN 500 MG CAPSULE: 6 days supply | Qty: 18 | Fill #0

## 2021-11-06 DIAGNOSIS — I5030 Unspecified diastolic (congestive) heart failure: Principal | ICD-10-CM

## 2021-11-06 MED ORDER — JARDIANCE 10 MG TABLET
ORAL_TABLET | 1 refills | 0 days | Status: CP
Start: 2021-11-06 — End: ?

## 2021-11-06 MED ORDER — CARVEDILOL 12.5 MG TABLET
ORAL_TABLET | 1 refills | 0 days | Status: CP
Start: 2021-11-06 — End: ?

## 2021-11-21 MED FILL — CLONAZEPAM 1 MG TABLET: ORAL | 30 days supply | Qty: 60 | Fill #2

## 2021-11-23 MED ORDER — FUROSEMIDE 40 MG TABLET
ORAL_TABLET | 1 refills | 0 days | Status: CP
Start: 2021-11-23 — End: ?

## 2021-11-23 MED ORDER — AMLODIPINE 10 MG TABLET
ORAL_TABLET | 1 refills | 0 days | Status: CP
Start: 2021-11-23 — End: ?

## 2021-11-25 MED ORDER — FUROSEMIDE 20 MG TABLET
ORAL_TABLET | Freq: Every day | ORAL | 0 refills | 30.00000 days
Start: 2021-11-25 — End: ?

## 2021-12-02 DIAGNOSIS — I5030 Unspecified diastolic (congestive) heart failure: Principal | ICD-10-CM

## 2021-12-02 MED ORDER — SPIRONOLACTONE 25 MG TABLET
ORAL_TABLET | 1 refills | 0 days | Status: CP
Start: 2021-12-02 — End: ?

## 2021-12-10 ENCOUNTER — Ambulatory Visit: Admit: 2021-12-10 | Discharge: 2021-12-11 | Payer: MEDICARE

## 2021-12-10 DIAGNOSIS — G40219 Localization-related (focal) (partial) symptomatic epilepsy and epileptic syndromes with complex partial seizures, intractable, without status epilepticus: Principal | ICD-10-CM

## 2021-12-10 MED ORDER — LACOSAMIDE 150 MG TABLET
ORAL_TABLET | Freq: Two times a day (BID) | ORAL | 1 refills | 90 days | Status: CP
Start: 2021-12-10 — End: ?

## 2021-12-10 MED ORDER — CLONAZEPAM 1 MG TABLET
ORAL_TABLET | Freq: Two times a day (BID) | ORAL | 1 refills | 90 days | Status: CP
Start: 2021-12-10 — End: ?
  Filled 2021-12-31: qty 60, 30d supply, fill #0

## 2021-12-10 MED ORDER — LEVETIRACETAM 500 MG TABLET
ORAL_TABLET | 4 refills | 0 days | Status: CP
Start: 2021-12-10 — End: ?

## 2021-12-10 MED ORDER — GABAPENTIN 300 MG CAPSULE
ORAL_CAPSULE | Freq: Two times a day (BID) | ORAL | 4 refills | 90 days | Status: CP
Start: 2021-12-10 — End: 2022-12-10

## 2022-01-20 ENCOUNTER — Ambulatory Visit
Admit: 2022-01-20 | Discharge: 2022-01-21 | Payer: MEDICARE | Attending: Student in an Organized Health Care Education/Training Program | Primary: Student in an Organized Health Care Education/Training Program

## 2022-01-20 DIAGNOSIS — F17210 Nicotine dependence, cigarettes, uncomplicated: Principal | ICD-10-CM

## 2022-01-20 DIAGNOSIS — Z5986 Financial insecurity: Principal | ICD-10-CM

## 2022-01-20 DIAGNOSIS — Z171 Estrogen receptor negative status [ER-]: Principal | ICD-10-CM

## 2022-01-20 DIAGNOSIS — C50412 Malignant neoplasm of upper-outer quadrant of left female breast: Principal | ICD-10-CM

## 2022-01-20 DIAGNOSIS — Z5941 Food insecurity: Principal | ICD-10-CM

## 2022-01-20 DIAGNOSIS — G40219 Localization-related (focal) (partial) symptomatic epilepsy and epileptic syndromes with complex partial seizures, intractable, without status epilepticus: Principal | ICD-10-CM

## 2022-01-20 DIAGNOSIS — Z0000 Encounter for general adult medical examination without abnormal findings: Principal | ICD-10-CM

## 2022-01-20 DIAGNOSIS — I2510 Atherosclerotic heart disease of native coronary artery without angina pectoris: Principal | ICD-10-CM

## 2022-01-20 DIAGNOSIS — N1832 Stage 3b chronic kidney disease (CMS-HCC): Principal | ICD-10-CM

## 2022-01-20 DIAGNOSIS — Z7189 Other specified counseling: Principal | ICD-10-CM

## 2022-01-20 DIAGNOSIS — I5030 Unspecified diastolic (congestive) heart failure: Principal | ICD-10-CM

## 2022-01-20 DIAGNOSIS — I10 Essential (primary) hypertension: Principal | ICD-10-CM

## 2022-01-23 MED ORDER — LOSARTAN 100 MG TABLET
ORAL_TABLET | 1 refills | 0 days | Status: CP
Start: 2022-01-23 — End: ?

## 2022-02-10 MED FILL — CLONAZEPAM 1 MG TABLET: ORAL | 30 days supply | Qty: 60 | Fill #1

## 2022-03-06 ENCOUNTER — Other Ambulatory Visit: Payer: Self-pay

## 2022-03-06 ENCOUNTER — Ambulatory Visit
Admission: EM | Admit: 2022-03-06 | Discharge: 2022-03-06 | Disposition: A | Discharge status: Home / Self Care | Payer: Medicare HMO | Attending: Nurse Practitioner | Admitting: Nurse Practitioner

## 2022-03-06 ENCOUNTER — Encounter: Payer: Self-pay | Admitting: Emergency Medicine

## 2022-03-06 DIAGNOSIS — R220 Localized swelling, mass and lump, head: Secondary | ICD-10-CM | POA: Diagnosis not present

## 2022-03-06 DIAGNOSIS — T7840XA Allergy, unspecified, initial encounter: Secondary | ICD-10-CM | POA: Diagnosis not present

## 2022-03-06 MED ORDER — DEXAMETHASONE SODIUM PHOSPHATE 10 MG/ML IJ SOLN
10.0000 mg | Freq: Once | INTRAMUSCULAR | Status: AC
Start: 2022-03-06 — End: 2022-03-06
  Administered 2022-03-06: 10 mg via INTRAMUSCULAR

## 2022-03-06 MED ORDER — PREDNISONE 10 MG (21) PO TBPK: ORAL_TABLET | Freq: Every day | ORAL | 0 refills | Status: DC

## 2022-03-06 MED ORDER — PREDNISONE 10 MG TABLET
ORAL_TABLET | 0 refills | 0 days
Start: 2022-03-06 — End: ?
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
## 2022-03-06 NOTE — ED Triage Notes (Signed)
Pt c/o rash and facial swelling. Started about a month ago. She states it has been getting worse. She has tried hydrocortisone, and benadryl.

## 2022-03-06 NOTE — Discharge Instructions (Addendum)
Facial Swelling unknown cause  Encourage dermatology and opthalmology appointment  Prednisone 10 mg Dosepak as directed.

## 2022-03-07 MED FILL — PREDNISONE 10 MG TABLET: 12 days supply | Qty: 42 | Fill #0

## 2022-03-11 MED FILL — CLONAZEPAM 1 MG TABLET: ORAL | 30 days supply | Qty: 60 | Fill #2

## 2022-03-14 ENCOUNTER — Ambulatory Visit: Admit: 2022-03-14 | Discharge: 2022-03-15 | Payer: MEDICARE

## 2022-03-14 DIAGNOSIS — R279 Unspecified lack of coordination: Principal | ICD-10-CM

## 2022-03-14 DIAGNOSIS — R29898 Other symptoms and signs involving the musculoskeletal system: Principal | ICD-10-CM

## 2022-03-14 DIAGNOSIS — R296 Repeated falls: Principal | ICD-10-CM

## 2022-03-15 DIAGNOSIS — E876 Hypokalemia: Principal | ICD-10-CM

## 2022-03-15 DIAGNOSIS — R531 Weakness: Principal | ICD-10-CM

## 2022-03-17 ENCOUNTER — Ambulatory Visit: Admit: 2022-03-17 | Discharge: 2022-03-18 | Payer: MEDICARE

## 2022-03-17 DIAGNOSIS — E876 Hypokalemia: Principal | ICD-10-CM

## 2022-03-17 MED ORDER — POTASSIUM CHLORIDE ER 10 MEQ TABLET,EXTENDED RELEASE(PART/CRYST)
ORAL_TABLET | Freq: Every day | ORAL | 0 refills | 5 days | Status: CP
Start: 2022-03-17 — End: 2023-03-17

## 2022-03-20 ENCOUNTER — Ambulatory Visit: Admit: 2022-03-20 | Discharge: 2022-03-20 | Payer: MEDICARE

## 2022-03-20 DIAGNOSIS — E876 Hypokalemia: Principal | ICD-10-CM

## 2022-03-21 DIAGNOSIS — E875 Hyperkalemia: Principal | ICD-10-CM

## 2022-04-03 ENCOUNTER — Ambulatory Visit: Admit: 2022-04-03 | Discharge: 2022-04-04 | Payer: MEDICARE

## 2022-04-03 DIAGNOSIS — E876 Hypokalemia: Principal | ICD-10-CM

## 2022-04-03 DIAGNOSIS — L309 Dermatitis, unspecified: Principal | ICD-10-CM

## 2022-04-03 DIAGNOSIS — I951 Orthostatic hypotension: Principal | ICD-10-CM

## 2022-04-03 DIAGNOSIS — L03113 Cellulitis of right upper limb: Principal | ICD-10-CM

## 2022-04-03 MED ORDER — CEPHALEXIN 500 MG CAPSULE
ORAL_CAPSULE | Freq: Three times a day (TID) | ORAL | 0 refills | 7 days | Status: CP
Start: 2022-04-03 — End: 2022-04-10
  Filled 2022-04-03: qty 21, 7d supply, fill #0

## 2022-04-03 MED ORDER — PREDNISONE 20 MG TABLET
ORAL_TABLET | Freq: Every day | ORAL | 0 refills | 5.00000 days | Status: CP
Start: 2022-04-03 — End: 2022-04-03
  Filled 2022-04-03: qty 10, 5d supply, fill #0

## 2022-04-11 DIAGNOSIS — L309 Dermatitis, unspecified: Principal | ICD-10-CM

## 2022-04-11 DIAGNOSIS — L03113 Cellulitis of right upper limb: Principal | ICD-10-CM

## 2022-04-11 MED ORDER — PREDNISONE 20 MG TABLET
ORAL_TABLET | 0 refills | 0 days
Start: 2022-04-11 — End: ?

## 2022-04-11 MED ORDER — CEPHALEXIN 500 MG CAPSULE
ORAL_CAPSULE | 0 refills | 0 days
Start: 2022-04-11 — End: ?

## 2022-04-15 ENCOUNTER — Ambulatory Visit: Admit: 2022-04-15 | Discharge: 2022-04-16 | Payer: MEDICARE

## 2022-04-15 DIAGNOSIS — L309 Dermatitis, unspecified: Principal | ICD-10-CM

## 2022-04-15 DIAGNOSIS — L299 Pruritus, unspecified: Principal | ICD-10-CM

## 2022-04-15 MED ORDER — EUCERIN ORIGINAL LOTION
Freq: Three times a day (TID) | TOPICAL | 0 refills | 0 days | Status: CP | PRN
Start: 2022-04-15 — End: ?

## 2022-04-15 MED ORDER — HYDROXYZINE HCL 25 MG TABLET
ORAL_TABLET | Freq: Three times a day (TID) | ORAL | 1 refills | 20 days | Status: CP | PRN
Start: 2022-04-15 — End: 2022-05-15
  Filled 2022-04-16: qty 60, 20d supply, fill #0

## 2022-04-17 ENCOUNTER — Ambulatory Visit
Admit: 2022-04-17 | Discharge: 2022-04-18 | Payer: MEDICARE | Attending: Student in an Organized Health Care Education/Training Program | Primary: Student in an Organized Health Care Education/Training Program

## 2022-04-17 DIAGNOSIS — L309 Dermatitis, unspecified: Principal | ICD-10-CM

## 2022-04-17 MED ORDER — BETAMETHASONE DIPROPIONATE 0.05 % TOPICAL OINTMENT
Freq: Two times a day (BID) | TOPICAL | 4 refills | 0 days | Status: CP
Start: 2022-04-17 — End: 2023-04-17
  Filled 2022-04-18: qty 45, 7d supply, fill #0

## 2022-04-22 MED FILL — CLONAZEPAM 1 MG TABLET: ORAL | 30 days supply | Qty: 60 | Fill #3

## 2022-05-05 DIAGNOSIS — M791 Myalgia, unspecified site: Principal | ICD-10-CM

## 2022-05-05 DIAGNOSIS — E7800 Pure hypercholesterolemia, unspecified: Principal | ICD-10-CM

## 2022-05-05 MED ORDER — ATORVASTATIN 40 MG TABLET
ORAL_TABLET | Freq: Every day | ORAL | 3 refills | 90 days
Start: 2022-05-05 — End: ?

## 2022-05-07 ENCOUNTER — Ambulatory Visit: Admit: 2022-05-07 | Discharge: 2022-05-08 | Payer: MEDICARE

## 2022-05-08 MED ORDER — GABAPENTIN 100 MG CAPSULE
ORAL_CAPSULE | Freq: Two times a day (BID) | ORAL | 0 refills | 30 days | Status: CN
Start: 2022-05-08 — End: 2022-06-07

## 2022-05-08 MED ORDER — CARVEDILOL 6.25 MG TABLET
ORAL_TABLET | Freq: Two times a day (BID) | ORAL | 0 refills | 30 days | Status: CN
Start: 2022-05-08 — End: 2022-06-07

## 2022-05-13 ENCOUNTER — Ambulatory Visit: Admit: 2022-05-13 | Discharge: 2022-05-14 | Payer: MEDICARE

## 2022-05-13 DIAGNOSIS — N1832 Stage 3b chronic kidney disease (CMS-HCC): Principal | ICD-10-CM

## 2022-05-13 DIAGNOSIS — I2510 Atherosclerotic heart disease of native coronary artery without angina pectoris: Principal | ICD-10-CM

## 2022-05-13 DIAGNOSIS — I10 Essential (primary) hypertension: Principal | ICD-10-CM

## 2022-05-14 DIAGNOSIS — E875 Hyperkalemia: Principal | ICD-10-CM

## 2022-05-26 ENCOUNTER — Ambulatory Visit
Admit: 2022-05-26 | Discharge: 2022-05-27 | Payer: MEDICARE | Attending: Hematology & Oncology | Primary: Hematology & Oncology

## 2022-05-26 DIAGNOSIS — Z23 Encounter for immunization: Principal | ICD-10-CM

## 2022-05-26 DIAGNOSIS — E875 Hyperkalemia: Principal | ICD-10-CM

## 2022-05-26 DIAGNOSIS — B86 Scabies: Principal | ICD-10-CM

## 2022-05-26 MED ORDER — PERMETHRIN 5 % TOPICAL CREAM
Freq: Once | TOPICAL | 0 refills | 120 days | Status: CP
Start: 2022-05-26 — End: 2022-05-27
  Filled 2022-05-26: qty 60, 7d supply, fill #0

## 2022-05-26 MED ORDER — NITROGLYCERIN 0.4 MG SUBLINGUAL TABLET
ORAL_TABLET | SUBLINGUAL | 0 refills | 1 days | Status: CP | PRN
Start: 2022-05-26 — End: ?
  Filled 2022-05-26: qty 25, 7d supply, fill #0

## 2022-05-28 ENCOUNTER — Ambulatory Visit: Admit: 2022-05-28 | Discharge: 2022-05-29 | Payer: MEDICARE

## 2022-05-28 DIAGNOSIS — R079 Chest pain, unspecified: Principal | ICD-10-CM

## 2022-05-31 MED FILL — CLONAZEPAM 1 MG TABLET: ORAL | 30 days supply | Qty: 60 | Fill #4

## 2022-06-08 DIAGNOSIS — I5030 Unspecified diastolic (congestive) heart failure: Principal | ICD-10-CM

## 2022-06-08 MED ORDER — CARVEDILOL 12.5 MG TABLET
ORAL_TABLET | ORAL | 10 refills | 0 days
Start: 2022-06-08 — End: ?

## 2022-06-08 MED ORDER — JARDIANCE 10 MG TABLET
ORAL_TABLET | 10 refills | 0 days
Start: 2022-06-08 — End: ?

## 2022-06-09 MED ORDER — JARDIANCE 10 MG TABLET
ORAL_TABLET | 10 refills | 0 days
Start: 2022-06-09 — End: ?

## 2022-06-09 MED ORDER — CARVEDILOL 12.5 MG TABLET
ORAL_TABLET | ORAL | 10 refills | 0 days
Start: 2022-06-09 — End: ?

## 2022-06-17 DIAGNOSIS — G40219 Localization-related (focal) (partial) symptomatic epilepsy and epileptic syndromes with complex partial seizures, intractable, without status epilepticus: Principal | ICD-10-CM

## 2022-06-17 MED ORDER — LACOSAMIDE 150 MG TABLET
ORAL_TABLET | ORAL | 0 refills | 0 days
Start: 2022-06-17 — End: ?

## 2022-06-18 MED ORDER — LACOSAMIDE 150 MG TABLET
ORAL_TABLET | ORAL | 1 refills | 0 days | Status: CP
Start: 2022-06-18 — End: ?

## 2022-06-23 DIAGNOSIS — G40219 Localization-related (focal) (partial) symptomatic epilepsy and epileptic syndromes with complex partial seizures, intractable, without status epilepticus: Principal | ICD-10-CM

## 2022-06-23 MED ORDER — CLONAZEPAM 1 MG TABLET
ORAL_TABLET | Freq: Two times a day (BID) | ORAL | 1 refills | 90 days | Status: CP
Start: 2022-06-23 — End: ?
  Filled 2022-06-28: qty 60, 30d supply, fill #0

## 2022-06-29 MED ORDER — FUROSEMIDE 40 MG TABLET
ORAL_TABLET | Freq: Every day | ORAL | 10 refills | 0.00000 days
Start: 2022-06-29 — End: ?

## 2022-06-30 MED ORDER — FUROSEMIDE 40 MG TABLET
ORAL_TABLET | Freq: Every day | ORAL | 10 refills | 90.00000 days
Start: 2022-06-30 — End: ?

## 2022-07-08 ENCOUNTER — Ambulatory Visit
Admit: 2022-07-08 | Discharge: 2022-07-09 | Payer: MEDICARE | Attending: Student in an Organized Health Care Education/Training Program | Primary: Student in an Organized Health Care Education/Training Program

## 2022-07-08 DIAGNOSIS — I5030 Unspecified diastolic (congestive) heart failure: Principal | ICD-10-CM

## 2022-07-08 DIAGNOSIS — E7800 Pure hypercholesterolemia, unspecified: Principal | ICD-10-CM

## 2022-07-08 DIAGNOSIS — I2510 Atherosclerotic heart disease of native coronary artery without angina pectoris: Principal | ICD-10-CM

## 2022-07-08 DIAGNOSIS — Q2549 Other congenital malformations of aorta: Principal | ICD-10-CM

## 2022-07-08 DIAGNOSIS — I2582 Chronic total occlusion of coronary artery: Principal | ICD-10-CM

## 2022-07-08 DIAGNOSIS — Z720 Tobacco use: Principal | ICD-10-CM

## 2022-07-08 MED ORDER — SPIRONOLACTONE 25 MG TABLET
ORAL_TABLET | Freq: Every day | ORAL | 3 refills | 90 days | Status: CP
Start: 2022-07-08 — End: 2023-07-03

## 2022-07-08 MED ORDER — LOSARTAN 100 MG TABLET
ORAL_TABLET | Freq: Every day | ORAL | 3 refills | 90 days | Status: CP
Start: 2022-07-08 — End: 2023-07-03

## 2022-07-08 MED ORDER — ATORVASTATIN 40 MG TABLET
ORAL_TABLET | Freq: Every day | ORAL | 3 refills | 90 days | Status: CP
Start: 2022-07-08 — End: 2023-07-08

## 2022-07-08 MED ORDER — EMPAGLIFLOZIN 10 MG TABLET
ORAL_TABLET | Freq: Every day | ORAL | 3 refills | 90 days | Status: CP
Start: 2022-07-08 — End: 2023-07-03

## 2022-07-14 ENCOUNTER — Ambulatory Visit
Admit: 2022-07-14 | Discharge: 2022-07-15 | Payer: MEDICARE | Attending: Student in an Organized Health Care Education/Training Program | Primary: Student in an Organized Health Care Education/Training Program

## 2022-07-14 DIAGNOSIS — I2510 Atherosclerotic heart disease of native coronary artery without angina pectoris: Principal | ICD-10-CM

## 2022-07-14 DIAGNOSIS — I10 Essential (primary) hypertension: Principal | ICD-10-CM

## 2022-07-14 DIAGNOSIS — E7800 Pure hypercholesterolemia, unspecified: Principal | ICD-10-CM

## 2022-07-14 DIAGNOSIS — B86 Scabies: Principal | ICD-10-CM

## 2022-07-14 DIAGNOSIS — I5030 Unspecified diastolic (congestive) heart failure: Principal | ICD-10-CM

## 2022-07-14 MED ORDER — PERMETHRIN 5 % TOPICAL CREAM
Freq: Once | TOPICAL | 0 refills | 1.00000 days | Status: CP
Start: 2022-07-14 — End: 2022-07-14
  Filled 2022-07-14: qty 60, 1d supply, fill #0

## 2022-07-14 MED ORDER — CARVEDILOL 6.25 MG TABLET
ORAL_TABLET | Freq: Two times a day (BID) | ORAL | 11 refills | 30.00000 days | Status: CP
Start: 2022-07-14 — End: 2023-07-14
  Filled 2022-07-14: qty 60, 30d supply, fill #0

## 2022-07-14 MED ORDER — IVERMECTIN 3 MG TABLET
ORAL_TABLET | Freq: Once | ORAL | 1 refills | 1.00000 days | Status: CP
Start: 2022-07-14 — End: 2022-07-14
  Filled 2022-07-15: qty 1, 1d supply, fill #0

## 2022-07-21 DIAGNOSIS — B86 Scabies: Principal | ICD-10-CM

## 2022-07-21 MED ORDER — PERMETHRIN 5 % TOPICAL CREAM
Freq: Once | TOPICAL | 0 refills | 120 days | Status: CP
Start: 2022-07-21 — End: 2022-07-21

## 2022-07-29 MED ORDER — PREDNISONE 10 MG TABLET
ORAL_TABLET | 0 refills | 0 days
Start: 2022-07-29 — End: ?

## 2022-07-29 MED ORDER — CLOBETASOL 0.05 % TOPICAL OINTMENT
TOPICAL | 1 refills | 0 days
Start: 2022-07-29 — End: ?

## 2022-07-29 MED ORDER — TACROLIMUS 0.1 % TOPICAL OINTMENT
TOPICAL | 1 refills | 0 days
Start: 2022-07-29 — End: ?

## 2022-07-30 MED FILL — TACROLIMUS 0.1 % TOPICAL OINTMENT: TOPICAL | 30 days supply | Qty: 100 | Fill #0

## 2022-07-30 MED FILL — PREDNISONE 10 MG TABLET: 12 days supply | Qty: 30 | Fill #0

## 2022-07-30 MED FILL — CLOBETASOL 0.05 % TOPICAL OINTMENT: TOPICAL | 30 days supply | Qty: 120 | Fill #0

## 2022-07-31 MED ORDER — FLUOCINONIDE 0.05 % TOPICAL SOLUTION
Freq: Every day | TOPICAL | 0 refills | 0 days
Start: 2022-07-31 — End: ?

## 2022-08-01 MED FILL — FLUOCINONIDE 0.05 % TOPICAL SOLUTION: TOPICAL | 30 days supply | Qty: 60 | Fill #0

## 2022-08-02 MED FILL — CLONAZEPAM 1 MG TABLET: ORAL | 30 days supply | Qty: 60 | Fill #1

## 2022-08-15 DIAGNOSIS — G40219 Localization-related (focal) (partial) symptomatic epilepsy and epileptic syndromes with complex partial seizures, intractable, without status epilepticus: Principal | ICD-10-CM

## 2022-08-15 MED ORDER — GABAPENTIN 300 MG CAPSULE
ORAL_CAPSULE | ORAL | 3 refills | 0 days | Status: CP
Start: 2022-08-15 — End: ?

## 2022-08-16 DIAGNOSIS — B86 Scabies: Principal | ICD-10-CM

## 2022-08-16 MED ORDER — PERMETHRIN 5 % TOPICAL CREAM
Freq: Once | TOPICAL | 3 refills | 0 days
Start: 2022-08-16 — End: ?

## 2022-08-18 MED ORDER — PERMETHRIN 5 % TOPICAL CREAM
Freq: Once | TOPICAL | 3 refills | 120 days
Start: 2022-08-18 — End: 2022-08-18

## 2022-09-03 MED FILL — CLONAZEPAM 1 MG TABLET: ORAL | 30 days supply | Qty: 60 | Fill #2

## 2022-10-10 ENCOUNTER — Emergency Department: Payer: Medicare HMO

## 2022-10-10 ENCOUNTER — Inpatient Hospital Stay
Admission: EM | Admit: 2022-10-10 | Discharge: 2022-10-17 | DRG: 101 | Disposition: A | Discharge status: Skilled Nursing Facility | Payer: Medicare HMO | Attending: Internal Medicine | Admitting: Internal Medicine

## 2022-10-10 DIAGNOSIS — R400 Somnolence: Secondary | ICD-10-CM | POA: Diagnosis present

## 2022-10-10 DIAGNOSIS — D631 Anemia in chronic kidney disease: Secondary | ICD-10-CM | POA: Diagnosis present

## 2022-10-10 DIAGNOSIS — G459 Transient cerebral ischemic attack, unspecified: Secondary | ICD-10-CM | POA: Diagnosis present

## 2022-10-10 DIAGNOSIS — R410 Disorientation, unspecified: Secondary | ICD-10-CM | POA: Diagnosis not present

## 2022-10-10 DIAGNOSIS — I2510 Atherosclerotic heart disease of native coronary artery without angina pectoris: Secondary | ICD-10-CM | POA: Diagnosis present

## 2022-10-10 DIAGNOSIS — R29818 Other symptoms and signs involving the nervous system: Secondary | ICD-10-CM | POA: Diagnosis present

## 2022-10-10 DIAGNOSIS — R471 Dysarthria and anarthria: Secondary | ICD-10-CM | POA: Diagnosis present

## 2022-10-10 DIAGNOSIS — W19XXXA Unspecified fall, initial encounter: Secondary | ICD-10-CM | POA: Diagnosis present

## 2022-10-10 DIAGNOSIS — Z91041 Radiographic dye allergy status: Secondary | ICD-10-CM | POA: Diagnosis not present

## 2022-10-10 DIAGNOSIS — I130 Hypertensive heart and chronic kidney disease with heart failure and stage 1 through stage 4 chronic kidney disease, or unspecified chronic kidney disease: Secondary | ICD-10-CM | POA: Diagnosis present

## 2022-10-10 DIAGNOSIS — E8721 Acute metabolic acidosis: Secondary | ICD-10-CM | POA: Diagnosis not present

## 2022-10-10 DIAGNOSIS — R569 Unspecified convulsions: Secondary | ICD-10-CM | POA: Diagnosis not present

## 2022-10-10 DIAGNOSIS — E871 Hypo-osmolality and hyponatremia: Secondary | ICD-10-CM | POA: Diagnosis present

## 2022-10-10 DIAGNOSIS — G40909 Epilepsy, unspecified, not intractable, without status epilepticus: Secondary | ICD-10-CM

## 2022-10-10 DIAGNOSIS — Z9012 Acquired absence of left breast and nipple: Secondary | ICD-10-CM | POA: Diagnosis not present

## 2022-10-10 DIAGNOSIS — R4781 Slurred speech: Secondary | ICD-10-CM | POA: Diagnosis present

## 2022-10-10 DIAGNOSIS — Z79899 Other long term (current) drug therapy: Secondary | ICD-10-CM | POA: Diagnosis not present

## 2022-10-10 DIAGNOSIS — F05 Delirium due to known physiological condition: Secondary | ICD-10-CM | POA: Diagnosis not present

## 2022-10-10 DIAGNOSIS — N390 Urinary tract infection, site not specified: Secondary | ICD-10-CM | POA: Diagnosis not present

## 2022-10-10 DIAGNOSIS — F17210 Nicotine dependence, cigarettes, uncomplicated: Secondary | ICD-10-CM | POA: Diagnosis present

## 2022-10-10 DIAGNOSIS — Z8249 Family history of ischemic heart disease and other diseases of the circulatory system: Secondary | ICD-10-CM | POA: Diagnosis not present

## 2022-10-10 DIAGNOSIS — E039 Hypothyroidism, unspecified: Secondary | ICD-10-CM | POA: Diagnosis present

## 2022-10-10 DIAGNOSIS — I10 Essential (primary) hypertension: Secondary | ICD-10-CM | POA: Diagnosis present

## 2022-10-10 DIAGNOSIS — R4182 Altered mental status, unspecified: Secondary | ICD-10-CM | POA: Diagnosis not present

## 2022-10-10 DIAGNOSIS — N1832 Chronic kidney disease, stage 3b: Secondary | ICD-10-CM | POA: Diagnosis present

## 2022-10-10 DIAGNOSIS — E785 Hyperlipidemia, unspecified: Secondary | ICD-10-CM | POA: Diagnosis present

## 2022-10-10 DIAGNOSIS — Z853 Personal history of malignant neoplasm of breast: Secondary | ICD-10-CM

## 2022-10-10 DIAGNOSIS — Z91048 Other nonmedicinal substance allergy status: Secondary | ICD-10-CM

## 2022-10-10 DIAGNOSIS — I639 Cerebral infarction, unspecified: Secondary | ICD-10-CM | POA: Diagnosis present

## 2022-10-10 DIAGNOSIS — Z7984 Long term (current) use of oral hypoglycemic drugs: Secondary | ICD-10-CM

## 2022-10-10 DIAGNOSIS — R451 Restlessness and agitation: Secondary | ICD-10-CM | POA: Diagnosis not present

## 2022-10-10 DIAGNOSIS — R531 Weakness: Secondary | ICD-10-CM

## 2022-10-10 DIAGNOSIS — R5383 Other fatigue: Secondary | ICD-10-CM | POA: Diagnosis not present

## 2022-10-10 DIAGNOSIS — G40109 Localization-related (focal) (partial) symptomatic epilepsy and epileptic syndromes with simple partial seizures, not intractable, without status epilepticus: Secondary | ICD-10-CM | POA: Diagnosis present

## 2022-10-10 DIAGNOSIS — G40919 Epilepsy, unspecified, intractable, without status epilepticus: Secondary | ICD-10-CM | POA: Diagnosis not present

## 2022-10-10 DIAGNOSIS — Z8673 Personal history of transient ischemic attack (TIA), and cerebral infarction without residual deficits: Secondary | ICD-10-CM

## 2022-10-10 DIAGNOSIS — I5032 Chronic diastolic (congestive) heart failure: Secondary | ICD-10-CM | POA: Diagnosis present

## 2022-10-10 LAB — CBC
HCT: 34.7 % — ABNORMAL LOW (ref 36.0–46.0)
Hemoglobin: 10.9 g/dL — ABNORMAL LOW (ref 12.0–15.0)
MCH: 28.4 pg (ref 26.0–34.0)
MCHC: 31.4 g/dL (ref 30.0–36.0)
MCV: 90.4 fL (ref 80.0–100.0)
Platelets: 179 10*3/uL (ref 150–400)
RBC: 3.84 MIL/uL — ABNORMAL LOW (ref 3.87–5.11)
RDW: 14.6 % (ref 11.5–15.5)
WBC: 6.5 10*3/uL (ref 4.0–10.5)
nRBC: 0 % (ref 0.0–0.2)

## 2022-10-10 LAB — COMPREHENSIVE METABOLIC PANEL
ALT: 8 U/L (ref 0–44)
AST: 15 U/L (ref 15–41)
Albumin: 3.9 g/dL (ref 3.5–5.0)
Alkaline Phosphatase: 132 U/L — ABNORMAL HIGH (ref 38–126)
Anion gap: 8 (ref 5–15)
BUN: 19 mg/dL (ref 8–23)
CO2: 19 mmol/L — ABNORMAL LOW (ref 22–32)
Calcium: 8.7 mg/dL — ABNORMAL LOW (ref 8.9–10.3)
Chloride: 102 mmol/L (ref 98–111)
Creatinine, Ser: 1.23 mg/dL — ABNORMAL HIGH (ref 0.44–1.00)
GFR, Estimated: 47 mL/min — ABNORMAL LOW (ref >60)
Glucose, Bld: 98 mg/dL (ref 70–99)
Potassium: 4.5 mmol/L (ref 3.5–5.1)
Sodium: 129 mmol/L — ABNORMAL LOW (ref 135–145)
Total Bilirubin: 0.5 mg/dL (ref 0.3–1.2)
Total Protein: 7.7 g/dL (ref 6.5–8.1)

## 2022-10-10 LAB — DIFFERENTIAL
Abs Immature Granulocytes: 0.03 10*3/uL (ref 0.00–0.07)
Basophils Absolute: 0.1 10*3/uL (ref 0.0–0.1)
Basophils Relative: 1 %
Eosinophils Absolute: 0.3 10*3/uL (ref 0.0–0.5)
Eosinophils Relative: 5 %
Immature Granulocytes: 1 %
Lymphocytes Relative: 9 %
Lymphs Abs: 0.6 10*3/uL — ABNORMAL LOW (ref 0.7–4.0)
Monocytes Absolute: 0.5 10*3/uL (ref 0.1–1.0)
Monocytes Relative: 8 %
Neutro Abs: 5 10*3/uL (ref 1.7–7.7)
Neutrophils Relative %: 76 %

## 2022-10-10 LAB — CBG MONITORING, ED: Glucose-Capillary: 96 mg/dL (ref 70–99)

## 2022-10-10 LAB — PROTIME-INR
INR: 1.1 (ref 0.8–1.2)
Prothrombin Time: 14.2 seconds (ref 11.4–15.2)

## 2022-10-10 LAB — AMMONIA: Ammonia: 20 umol/L (ref 9–35)

## 2022-10-10 LAB — APTT: aPTT: 48 seconds — ABNORMAL HIGH (ref 24–36)

## 2022-10-10 LAB — LACTIC ACID, PLASMA: Lactic Acid, Venous: 1.2 mmol/L (ref 0.5–1.9)

## 2022-10-10 LAB — ETHANOL: Alcohol, Ethyl (B): <10 mg/dL (ref <10)

## 2022-10-10 MED ORDER — ONDANSETRON HCL 4 MG/2ML IJ SOLN: 4.0000 mg | Freq: Four times a day (QID) | INTRAMUSCULAR | Status: DC | PRN

## 2022-10-10 MED ORDER — MAGNESIUM HYDROXIDE 400 MG/5ML PO SUSP: 30.0000 mL | Freq: Every day | ORAL | Status: DC | PRN

## 2022-10-10 MED ORDER — ACETAMINOPHEN 325 MG PO TABS
650.0000 mg | ORAL_TABLET | Freq: Four times a day (QID) | ORAL | Status: DC | PRN
  Administered 2022-10-11 – 2022-10-17 (×3): 650 mg via ORAL
  Filled 2022-10-10 (×3): qty 2

## 2022-10-10 MED ORDER — ENOXAPARIN SODIUM 40 MG/0.4ML IJ SOSY
40.0000 mg | PREFILLED_SYRINGE | INTRAMUSCULAR | Status: DC
  Administered 2022-10-11 – 2022-10-17 (×7): 40 mg via SUBCUTANEOUS
  Filled 2022-10-10 (×7): qty 0.4

## 2022-10-10 MED ORDER — ATORVASTATIN CALCIUM 20 MG PO TABS
40.0000 mg | ORAL_TABLET | Freq: Every day | ORAL | Status: DC
  Administered 2022-10-11 – 2022-10-17 (×7): 40 mg via ORAL
  Filled 2022-10-10 (×7): qty 2

## 2022-10-10 MED ORDER — GABAPENTIN 300 MG PO CAPS
300.0000 mg | ORAL_CAPSULE | Freq: Two times a day (BID) | ORAL | Status: DC
  Administered 2022-10-11 – 2022-10-17 (×14): 300 mg via ORAL
  Filled 2022-10-10 (×14): qty 1

## 2022-10-10 MED ORDER — LOSARTAN POTASSIUM 50 MG PO TABS
100.0000 mg | ORAL_TABLET | Freq: Every day | ORAL | Status: DC
  Administered 2022-10-11 – 2022-10-17 (×6): 100 mg via ORAL
  Filled 2022-10-10 (×6): qty 2

## 2022-10-10 MED ORDER — VITAMIN B-12 1000 MCG PO TABS
1000.0000 ug | ORAL_TABLET | Freq: Every day | ORAL | Status: DC
  Administered 2022-10-11 – 2022-10-17 (×7): 1000 ug via ORAL
  Filled 2022-10-10 (×7): qty 1

## 2022-10-10 MED ORDER — SPIRONOLACTONE 12.5 MG HALF TABLET
12.5000 mg | ORAL_TABLET | Freq: Every day | ORAL | Status: DC
  Administered 2022-10-11 – 2022-10-16 (×6): 12.5 mg via ORAL
  Filled 2022-10-10 (×6): qty 1

## 2022-10-10 MED ORDER — SODIUM CHLORIDE 0.9% FLUSH
3.0000 mL | Freq: Once | INTRAVENOUS | Status: AC
  Administered 2022-10-10: 3 mL via INTRAVENOUS

## 2022-10-10 MED ORDER — CARVEDILOL 6.25 MG PO TABS
12.5000 mg | ORAL_TABLET | Freq: Two times a day (BID) | ORAL | Status: DC
  Administered 2022-10-11 – 2022-10-16 (×12): 12.5 mg via ORAL
  Filled 2022-10-10 (×13): qty 2

## 2022-10-10 MED ORDER — SODIUM CHLORIDE 0.9 % IV SOLN: INTRAVENOUS | Status: DC

## 2022-10-10 MED ORDER — ASPIRIN 81 MG PO TBEC
81.0000 mg | DELAYED_RELEASE_TABLET | Freq: Every day | ORAL | Status: DC
  Administered 2022-10-10 – 2022-10-17 (×8): 81 mg via ORAL
  Filled 2022-10-10 (×8): qty 1

## 2022-10-10 MED ORDER — TACROLIMUS 0.1 % EX OINT: 1.0000 | TOPICAL_OINTMENT | Freq: Every day | CUTANEOUS | Status: DC | PRN

## 2022-10-10 MED ORDER — SODIUM CHLORIDE 0.9 % IV BOLUS
1000.0000 mL | Freq: Once | INTRAVENOUS | Status: AC
  Administered 2022-10-10: 1000 mL via INTRAVENOUS

## 2022-10-10 MED ORDER — NITROGLYCERIN 0.4 MG SL SUBL: 0.4000 mg | SUBLINGUAL_TABLET | SUBLINGUAL | Status: DC | PRN

## 2022-10-10 MED ORDER — STROKE: EARLY STAGES OF RECOVERY BOOK: Freq: Once | Status: AC

## 2022-10-10 MED ORDER — ONDANSETRON HCL 4 MG PO TABS: 4.0000 mg | ORAL_TABLET | Freq: Four times a day (QID) | ORAL | Status: DC | PRN

## 2022-10-10 MED ORDER — EMPAGLIFLOZIN 10 MG PO TABS
10.0000 mg | ORAL_TABLET | Freq: Every day | ORAL | Status: DC
  Administered 2022-10-11 – 2022-10-17 (×7): 10 mg via ORAL
  Filled 2022-10-10 (×7): qty 1

## 2022-10-10 MED ORDER — FUROSEMIDE 20 MG PO TABS
20.0000 mg | ORAL_TABLET | Freq: Every day | ORAL | Status: DC
  Administered 2022-10-11 – 2022-10-14 (×4): 20 mg via ORAL
  Filled 2022-10-10 (×4): qty 1

## 2022-10-10 MED ORDER — LACOSAMIDE 50 MG PO TABS
150.0000 mg | ORAL_TABLET | Freq: Two times a day (BID) | ORAL | Status: DC
  Administered 2022-10-10 – 2022-10-15 (×10): 150 mg via ORAL
  Filled 2022-10-10 (×10): qty 3

## 2022-10-10 MED ORDER — TRAZODONE HCL 50 MG PO TABS
25.0000 mg | ORAL_TABLET | Freq: Every evening | ORAL | Status: DC | PRN
  Administered 2022-10-11 – 2022-10-13 (×3): 25 mg via ORAL
  Filled 2022-10-10 (×3): qty 1

## 2022-10-10 MED ORDER — ACETAMINOPHEN 650 MG RE SUPP: 650.0000 mg | Freq: Four times a day (QID) | RECTAL | Status: DC | PRN

## 2022-10-10 MED ORDER — CLONAZEPAM 0.5 MG PO TABS
1.0000 mg | ORAL_TABLET | Freq: Two times a day (BID) | ORAL | Status: DC | PRN
  Administered 2022-10-11 – 2022-10-16 (×4): 1 mg via ORAL
  Filled 2022-10-10 (×4): qty 2

## 2022-10-10 MED ORDER — LEVETIRACETAM 750 MG PO TABS
750.0000 mg | ORAL_TABLET | Freq: Two times a day (BID) | ORAL | Status: DC
  Administered 2022-10-10 – 2022-10-17 (×14): 750 mg via ORAL
  Filled 2022-10-10 (×14): qty 1

## 2022-10-10 NOTE — Progress Notes (Signed)
  Chaplain On-Call responded to the Code Stroke notification at [place] hours.  Chaplain learned from Staff that the patient is at the CT Scan area. No family is present.  Chaplain assured Staff of availability as needed.  Chaplain Pollyann Samples M.Div., [facility]

## 2022-10-10 NOTE — ED Notes (Signed)
MD Archie Balboa notified of elevated BP.

## 2022-10-10 NOTE — Progress Notes (Signed)
Code stroke cart activated @ [id number].  EMS pre-alert.  Dr. Cheral Marker saw pt in the ED @ 1838.  Pt to CT @ 1840 with return @ 1855.  No TNK per Dr. Cheral Marker.  TSRN off camera @ 1900.  [name] BSN, Horticulturist, commercial

## 2022-10-10 NOTE — ED Notes (Signed)
Report received from [place], [place]

## 2022-10-10 NOTE — H&P (Signed)
[place]   PATIENT NAME: Anne Phillips    MR#:  [id number]  DATE OF BIRTH:  [birth year]  DATE OF ADMISSION:  10/10/2022  PRIMARY CARE PHYSICIAN: Patient, No Pcp Per   Patient is coming from: Home  REQUESTING/REFERRING PHYSICIAN: Nance Pear, MD  CHIEF COMPLAINT:   Chief Complaint  Patient presents with   Code Stroke    HISTORY OF PRESENT ILLNESS:  Anne Phillips is a 72 y.o. female with medical history significant for hypertension, dyslipidemia, coronary artery disease, seizure disorder breast cancer status postmastectomy tobacco use and hypothyroidism, who presented to the ER with 8 onset of fall.  The patient is been feeling generally weak and is not sure how she fell but she did not lose consciousness.  A family member was with her and left her in the kitchen and came later to find her on the floor.  She was having slurred speech then.  No paresthesias or focal muscle weakness.  She denies any head injuries or other injuries.  She thought her legs may give way.  She denies any seizures.  No urinary or stool incontinence or tongue bites.  No fever or chills.  No nausea or vomiting or diarrhea or abdominal pain.  No chest pain or palpitations.  No cough or wheezing.  No dysuria, oliguria or hematuria or flank pain.  ED Course: When she came to the ER BP was 164/76 with otherwise normal vital signs.  Labs revealed hyponatremia 129 and CO2 of 19 with a BUN of 19 and creatinine 1.3 with alk phos of 132.  Left cast was 1.2.  CBC showed anemia better than previous levels with PTT was 48 with normal PT and INR.  Alcohol level was less than 10. EKG as reviewed by me :  EKG showed sinus rhythm with a rate 82 with prolonged PR interval and T wave inversion laterally with anterior Q waves and LVH Imaging: Brain MRI without contrast revealed no acute intracranial normalities.  It showed mild age-related cerebral atrophy with chronic small vessel disease with a few small lacunar  infarcts about the thalami.  The patient was given 81 mg of p.o. aspirin, IV normal saline for 1 L, her Keppra and Vimpat.  She will be admitted to a medical telemetry bed for further evaluation and management. PAST MEDICAL HISTORY:   Past Medical History:  Diagnosis Date   Cancer ([place])    Coronary artery disease    Hyperlipidemia    Hypertension    Seizures ([place])    Thyroid disease     PAST SURGICAL HISTORY:   Past Surgical History:  Procedure Laterality Date   MASTECTOMY Left     SOCIAL HISTORY:   Social History   Tobacco Use   Smoking status: Every Day    Packs/day: 1.00    Types: Cigarettes   Smokeless tobacco: Never  Substance Use Topics   Alcohol use: No    FAMILY HISTORY:   Family History  Problem Relation Age of Onset   Hypertension Other     DRUG ALLERGIES:   Allergies  Allergen Reactions   Iodinated Contrast Media Hives and Other (See Comments)    Rash    Other Hives    Metal-reaction=rash Metal-reaction=rash     REVIEW OF SYSTEMS:   ROS As per history of present illness. All pertinent systems were reviewed above. Constitutional, HEENT, cardiovascular, respiratory, GI, GU, musculoskeletal, neuro, psychiatric, endocrine, integumentary and hematologic systems were reviewed and are otherwise negative/unremarkable  except for positive findings mentioned above in the HPI.   MEDICATIONS AT HOME:   Prior to Admission medications   Medication Sig Start Date End Date Taking? Authorizing Provider  atorvastatin (LIPITOR) 40 MG tablet Take 40 mg by mouth daily.   Yes [provider]  empagliflozin (JARDIANCE) 10 MG TABS tablet Take 10 mg by mouth daily.   Yes [provider]  gabapentin (NEURONTIN) 300 MG capsule Take 1 capsule (300 mg total) by mouth 2 (two) times daily. 10/05/20  Yes Domenic Polite, MD  Lacosamide (VIMPAT) 150 MG TABS Take 1 tablet (150 mg total) by mouth in the morning and at bedtime. 08/06/21  Yes Sharen Hones, MD   levETIRAcetam (KEPPRA) 500 MG tablet Take 500 mg by mouth 3 (three) times daily.   Yes [provider]  losartan (COZAAR) 100 MG tablet Take 100 mg by mouth daily. 08/19/22  Yes [provider]  spironolactone (ALDACTONE) 25 MG tablet Take 12.5 mg by mouth daily.   Yes [provider]  vitamin B-12 1000 MCG tablet Take 1 tablet (1,000 mcg total) by mouth daily. 08/06/21  Yes Sharen Hones, MD  acetaminophen (TYLENOL) 325 MG tablet Take 2 tablets (650 mg total) by mouth every 6 (six) hours as needed for mild pain (or Fever >/= 101). 10/05/20   Domenic Polite, MD  carvedilol (COREG) 6.25 MG tablet Take 1 tablet (6.25 mg total) by mouth 2 (two) times daily with a meal. Patient taking differently: Take 12.5 mg by mouth 2 (two) times daily with a meal. 08/06/21   Sharen Hones, MD  clonazePAM (KLONOPIN) 1 MG tablet Take 1 mg by mouth 2 (two) times daily as needed for anxiety.    [provider]  furosemide (LASIX) 20 MG tablet Take 1 tablet (20 mg total) by mouth daily. 10/27/21   Fritzi Mandes, MD  nitroGLYCERIN (NITROSTAT) 0.4 MG SL tablet Place under the tongue. 05/26/22   [provider]  tacrolimus (PROTOPIC) 0.1 % ointment Apply 1 Application topically daily as needed. 07/29/22   [provider]      VITAL SIGNS:  Blood pressure (!) 155/71, pulse 68, temperature 97.7 F (36.5 C), resp. rate 16, SpO2 100 %.  PHYSICAL EXAMINATION:  Physical Exam  GENERAL:  72 y.o.-year-old African-American female patient lying in the bed with no acute distress.  EYES: Pupils equal, round, reactive to light and accommodation. No scleral icterus. Extraocular muscles intact.  HEENT: Head atraumatic, normocephalic. Oropharynx and nasopharynx clear.  NECK:  Supple, no jugular venous distention. No thyroid enlargement, no tenderness.  LUNGS: Normal breath sounds bilaterally, no wheezing, rales,rhonchi or crepitation. No use of accessory muscles of respiration.   CARDIOVASCULAR: Regular rate and rhythm, S1, S2 normal. No murmurs, rubs, or gallops.  ABDOMEN: Soft, nondistended, nontender. Bowel sounds present. No organomegaly or mass.  EXTREMITIES: No pedal edema, cyanosis, or clubbing.  NEUROLOGIC: Cranial nerves II through XII are intact. Muscle strength 5/5 in all extremities. Sensation intact. Gait not checked.  PSYCHIATRIC: The patient is alert and oriented x 3.  Normal affect and good eye contact. SKIN: No obvious rash, lesion, or ulcer.   LABORATORY PANEL:   CBC Recent Labs  Lab 10/10/22 1906  WBC 6.5  HGB 10.9*  HCT 34.7*  PLT 179   ------------------------------------------------------------------------------------------------------------------  Chemistries  Recent Labs  Lab 10/10/22 1906  NA 129*  K 4.5  CL 102  CO2 19*  GLUCOSE 98  BUN 19  CREATININE 1.23*  CALCIUM 8.7*  AST 15  ALT 8  ALKPHOS 132*  BILITOT 0.5   ------------------------------------------------------------------------------------------------------------------  Cardiac Enzymes No results for input(s): "TROPONINI" in the last 168 hours. ------------------------------------------------------------------------------------------------------------------  RADIOLOGY:  MR BRAIN WO CONTRAST  Result Date: 10/10/2022 CLINICAL DATA:  Initial evaluation for neuro deficit, stroke suspected. EXAM: MRI HEAD WITHOUT CONTRAST TECHNIQUE: Multiplanar, multiecho pulse sequences of the brain and surrounding structures were obtained without intravenous contrast. COMPARISON:  Prior CT from earlier the same day. FINDINGS: Brain: Examination mildly degraded by motion artifact. Mild age-related cerebral atrophy with chronic small vessel ischemic disease. Few small remote lacunar infarcts noted about the thalami. No abnormal foci of restricted diffusion to suggest acute or subacute ischemia. Gray-white matter differentiation maintained. No areas of chronic cortical infarction. No  acute intracranial hemorrhage. Single chronic microhemorrhage noted at the left occipital region, of doubtful significance in isolation. No mass lesion, midline shift or mass effect. No hydrocephalus or extra-axial fluid collection. Pituitary gland and suprasellar region within normal limits. Vascular: Major intracranial vascular flow voids are maintained. Skull and upper cervical spine: Craniocervical junction within normal limits. Bone marrow signal intensity diffusely heterogeneous without worrisome osseous lesion. No scalp soft tissue abnormality. Sinuses/Orbits: Globes and orbital soft tissues within normal limits. Paranasal sinuses are largely clear. No significant mastoid effusion. Other: None. IMPRESSION: 1. No acute intracranial abnormality. 2. Mild age-related cerebral atrophy with chronic small vessel ischemic disease, with a few small remote lacunar infarcts about the thalami. Electronically Signed   By: Jeannine Boga M.D.   On: 10/10/2022 22:51   CT HEAD CODE STROKE WO CONTRAST  Result Date: 10/10/2022 CLINICAL DATA:  Code stroke.  Slurred speech and facial droop. EXAM: CT HEAD WITHOUT CONTRAST TECHNIQUE: Contiguous axial images were obtained from the base of the skull through the vertex without intravenous contrast. RADIATION DOSE REDUCTION: This exam was performed according to the departmental dose-optimization program which includes automated exposure control, adjustment of the mA and/or kV according to patient size and/or use of iterative reconstruction technique. COMPARISON:  Head CT 10/25/2021 and MRI 08/02/2021 FINDINGS: Brain: There is no evidence of an acute infarct, intracranial hemorrhage, mass, midline shift, or extra-axial fluid collection. A small chronic cortical/subcortical infarct in the right parietal lobe is unchanged. Hypodensities elsewhere in the cerebral white matter bilaterally are also unchanged and nonspecific but compatible with mild chronic small vessel ischemic  disease. There is a chronic lacunar infarct in the right thalamus. Mild cerebral atrophy is within normal limits for age. Vascular: Calcified atherosclerosis at the skull base. No hyperdense vessel. Skull: No acute fracture or suspicious osseous lesion. Sinuses/Orbits: Visualized paranasal sinuses and mastoid air cells are clear. Unremarkable orbits. Other: None. ASPECTS [facility] Stroke Program Early CT Score) - Ganglionic level infarction (caudate, lentiform nuclei, internal capsule, insula, M1-M3 cortex): 7 - Supraganglionic infarction (M4-M6 cortex): 3 Total score (0-10 with 10 being normal): 10 IMPRESSION: 1. No evidence of acute intracranial abnormality. ASPECTS of 10. 2. Mild chronic small vessel ischemic disease. These results were communicated to Dr. Cheral Marker at 7:07 pm on 10/10/2022 by text page via the [facility] messaging system. Electronically Signed   By: Logan Bores M.D.   On: 10/10/2022 19:07      IMPRESSION AND PLAN:  Assessment and Plan: * CVA (cerebral vascular accident) ([place]) - She could be having a TIA versus evolving CVA, with slurred speech and generalized weakness. - The patient will be admitted to an observation medically monitored bed.   - We will follow neuro checks q.4 hours for 24 hours.   -  The patient will be placed on aspirin.   - Will obtain a brain MRI without contrast as well as bilateral carotid Doppler and 2D echo with bubble study .   - A neurology consultation  as well as physical/occupation/speech therapy consults will be obtained in a.m.[place] Kitchen   - The patient will be placed on statin therapy and fasting lipids will be checked.   Seizure disorder ([place]) - Breakthrough seizure is any differential diagnosis however the patient denies any seizures or postictal phase. - She will be continued on her Keppra and Vimpat.  Chronic diastolic CHF (congestive heart failure) (H[facility]) - We will continue her Jardiance, Aldactone and Cozaar.  Coronary artery disease - We will continue  her aspirin Coreg and statin therapy.  Dyslipidemia - We will continue statin therapy.  Hypertension - We will continue her antihypertensives with permissive parameters.    DVT prophylaxis: Lovenox.  Advanced Care Planning:  Code Status: full code.  Family Communication:  The plan of care was discussed in details with the patient (and family). I answered all questions. The patient agreed to proceed with the above mentioned plan. Further management will depend upon hospital course. Disposition Plan: Back to previous home environment Consults called: Neurology All the records are reviewed and case discussed with ED provider.  Status is: Inpatient   At the time of the admission, it appears that the appropriate admission status for this patient is inpatient.  This is judged to be reasonable and necessary in order to provide the required intensity of service to ensure the patient's safety given the presenting symptoms, physical exam findings and initial radiographic and laboratory data in the context of comorbid conditions.  The patient requires inpatient status due to high intensity of service, high risk of further deterioration and high frequency of surveillance required.  I certify that at the time of admission, it is my clinical judgment that the patient will require inpatient hospital care extending more than 2 midnights.                            Dispo: The patient is from: Home              Anticipated d/c is to: Home              Patient currently is not medically stable to d/c.              Difficult to place patient: No  Christel Mormon M.D on 10/11/2022 at 2:57 AM  [facility]   From 7 PM-7 AM, contact night-coverage [email or web address]  CC: Primary care physician; Patient, No Pcp Per

## 2022-10-10 NOTE — ED Triage Notes (Signed)
Pt to ED via EMS for code stroke with ataxia, slurred speech, and weakness. Pt was in kitchen eating and was found by family in the floor with no report of head injury. Pt is oriented x4  with complaints of general lethargy. LKW was 1615. Neuro at bedside.

## 2022-10-10 NOTE — ED Notes (Signed)
Neuro and teleneuro to bedside

## 2022-10-10 NOTE — ED Provider Notes (Signed)
[facility] Provider Note    Event Date/Time   First MD Initiated Contact with Patient 10/10/22 1905     (approximate)   History   Code Stroke   HPI  Anne Phillips is a 72 y.o. female who presented to the emergency department today as a code stroke via EMS.  Apparently patient's family had left the room she was and when they came back they found that she was on the floor. The patient herself states that she feels tired, but denies any specific medical complaints. History is somewhat limited.      Physical Exam   Triage Vital Signs: ED Triage Vitals  Enc Vitals Group     BP 10/10/22 1915 (!) 202/95     Pulse Rate 10/10/22 1915 78     Resp 10/10/22 1915 17     Temp 10/10/22 1902 (!) 97.5 F (36.4 C)     Temp Source 10/10/22 1902 Oral     SpO2 10/10/22 1915 95 %     Weight --      Height --      Head Circumference --      Peak Flow --      Pain Score 10/10/22 1902 0     Pain Loc --      Pain Edu? --      Excl. in [place]? --     Most recent vital signs: Vitals:   10/10/22 1902 10/10/22 1915  BP:  (!) 202/95  Pulse:  78  Resp:  17  Temp: (!) 97.5 F (36.4 C)   SpO2:  95%   General: Awake, alert, not completely oriented. CV:  Good peripheral perfusion. Regular rate and rhythm. Resp:  Normal effort. Lungs clear. Abd:  No distention.    ED Results / Procedures / Treatments   Labs (all labs ordered are listed, but only abnormal results are displayed) Labs Reviewed  APTT - Abnormal; Notable for the following components:      Result Value   aPTT 48 (*)    All other components within normal limits  CBC - Abnormal; Notable for the following components:   RBC 3.84 (*)    Hemoglobin 10.9 (*)    HCT 34.7 (*)    All other components within normal limits  DIFFERENTIAL - Abnormal; Notable for the following components:   Lymphs Abs 0.6 (*)    All other components within normal limits  COMPREHENSIVE METABOLIC PANEL - Abnormal; Notable for  the following components:   Sodium 129 (*)    CO2 19 (*)    Creatinine, Ser 1.23 (*)    Calcium 8.7 (*)    Alkaline Phosphatase 132 (*)    GFR, Estimated 47 (*)    All other components within normal limits  PROTIME-INR  ETHANOL  LACTIC ACID, PLASMA  AMMONIA  LIPID PANEL  HEMOGLOBIN [id number]  BASIC METABOLIC PANEL  CBC  I-STAT CREATININE, ED  CBG MONITORING, ED     EKG  I, Nance Pear, attending physician, personally viewed and interpreted this EKG  EKG Time: 1907 Rate: 82 Rhythm: sinus atrial rhythm Axis: left axis deviation Intervals: qtc 468 QRS: LVH ST changes: no st elevation Impression: abnormal ekg   RADIOLOGY I independently interpreted and visualized the CT head. My interpretation: No bleed Radiology interpretation:  IMPRESSION:  1. No evidence of acute intracranial abnormality. ASPECTS of 10.  2. Mild chronic small vessel ischemic disease.    I independently interpreted and visualized the MR  brain. My interpretation: No large mass Radiology interpretation:  IMPRESSION:  1. No acute intracranial abnormality.  2. Mild age-related cerebral atrophy with chronic small vessel  ischemic disease, with a few small remote lacunar infarcts about the  thalami.     PROCEDURES:  Critical Care performed: No  Procedures   MEDICATIONS ORDERED IN ED: Medications  sodium chloride 0.9 % bolus 1,000 mL (has no administration in time range)  sodium chloride flush (NS) 0.9 % injection 3 mL (3 mLs Intravenous Given 10/10/22 1911)     IMPRESSION / MDM / ASSESSMENT AND PLAN / ED COURSE  I reviewed the triage vital signs and the nursing notes.                              Differential diagnosis includes, but is not limited to, CVA, TIA, infection  Patient's presentation is most consistent with acute presentation with potential threat to life or bodily function.  Patient presented to the emergency department today because of concerns for possible stroke.  Patient  was found by family down on the ground.  Patient cannot give a good history.  Patient was evaluated by Dr. Cheral Marker with neurology.  At this time he did not feel she was a TNK candidate.  Patient does also have history of seizures.  CT head and subsequent MRI brain without concerns for acute stroke.  Discussed with Dr. Sidney Ace who will plan on admission.   FINAL CLINICAL IMPRESSION(S) / ED DIAGNOSES   Final diagnoses:  Slurred speech  Weakness      Note:  This document was prepared using Dragon voice recognition software and may include unintentional dictation errors.    Nance Pear, MD 10/10/22 [id number]

## 2022-10-10 NOTE — Consult Note (Signed)
NEURO HOSPITALIST CONSULT NOTE   Requesting physician: Dr. Archie Balboa  Reason for Consult: Acute onset of dysphaai  History obtained from:  EMS, Patient and Chart     HPI:                                                                                                                                          Anne Phillips is an 72 y.o. female with a PMHx of cancer, CAD, HLD, HTN, seizures (on Vimpat 150 mg BID and Keppra 500 mg TID) and thyroid disease who presents to the ED via EMS as a Code Stroke after being found on the floor by family with slurred speech and diffuse weakness. She had been in the kitchen eating when last seen normal at 4:30 PM. It is unclear how she got on the floor, but per EMS there was no report or evidence of any injury to her head. She was not complaining of any headache, or of any limb or back pain. She was not incontinent of urine or bowels and no tongue-bite was noted by EMS. Vitals per EMS: BP 160/110, HR 45, 99% on RA. CBG was unremarkable per EMS. On arrival to the ED she appeared mildly drowsy and had slurred speech.   Past Medical History:  Diagnosis Date   Cancer [facility])    Coronary artery disease    Hyperlipidemia    Hypertension    Seizures ([place])    Thyroid disease     Past Surgical History:  Procedure Laterality Date   MASTECTOMY Left     Family History  Problem Relation Age of Onset   Hypertension Other              Social History:  reports that she has been smoking cigarettes. She has been smoking an average of 1 pack per day. She has never used smokeless tobacco. She reports that she does not drink alcohol and does not use drugs.  Allergies  Allergen Reactions   Iodinated Contrast Media Hives and Other (See Comments)    Rash     MEDICATIONS:  No current facility-administered medications on file prior to  encounter.   Current Outpatient Medications on File Prior to Encounter  Medication Sig Dispense Refill   acetaminophen (TYLENOL) 325 MG tablet Take 2 tablets (650 mg total) by mouth every 6 (six) hours as needed for mild pain (or Fever >/= 101).     atorvastatin (LIPITOR) 40 MG tablet Take 40 mg by mouth daily.     carvedilol (COREG) 6.25 MG tablet Take 1 tablet (6.25 mg total) by mouth 2 (two) times daily with a meal. (Patient taking differently: Take 12.5 mg by mouth 2 (two) times daily with a meal.)     clonazePAM (KLONOPIN) 1 MG tablet Take 1 mg by mouth 2 (two) times daily as needed for anxiety.     empagliflozin (JARDIANCE) 10 MG TABS tablet Take 10 mg by mouth daily.     furosemide (LASIX) 20 MG tablet Take 1 tablet (20 mg total) by mouth daily. 30 tablet 0   gabapentin (NEURONTIN) 300 MG capsule Take 1 capsule (300 mg total) by mouth 2 (two) times daily.     Lacosamide (VIMPAT) 150 MG TABS Take 1 tablet (150 mg total) by mouth in the morning and at bedtime. 60 tablet 0   levETIRAcetam (KEPPRA) 500 MG tablet Take 500 mg by mouth 3 (three) times daily.     predniSONE (STERAPRED UNI-PAK 21 TAB) 10 MG (21) TBPK tablet Take by mouth daily. Take 6 tabs by mouth daily  for 2 days, then 5 tabs for 2 days, then 4 tabs for 2 days, then 3 tabs for 2 days, 2 tabs for 2 days, then 1 tab by mouth daily for 2 days 42 tablet 0   spironolactone (ALDACTONE) 25 MG tablet Take 12.5 mg by mouth daily.     vitamin B-12 1000 MCG tablet Take 1 tablet (1,000 mcg total) by mouth daily.       ROS:                                                                                                                                       The patient denies any headache or limb pain. She acknowledges feeling sleepy. She denies any limb weakness or sensory numbness. Detailed ROS limited by patient drowsiness. Other symptoms as per HPI.    There were no vitals taken for this visit. BP (!) 184/80   Pulse 73   Temp (!)  97.5 F (36.4 C) (Oral)   Resp 16   SpO2 95%    General Examination:  Physical Exam HEENT-  [place]/AT. No nuchal rigidity. No diaphoresis. Skin temp is normal to touch.    Lungs- Respirations unlabored Extremities- Warm and well-perfused  Neurological Examination Mental Status: Drowsy. Oriented to the city, state and month, but not the day or the year. Speech is dysarthric but fluent. Can name the following as they are touched: big toe, pinky, nose and chin; she was unable to name her thumb. Able to follow all basic motor commands. Appears mildly to moderately confused. No agitation. There is waxing and waning of her level of consciousness throughout the exam, varying from awake, to drowsy to somnolent, with some dependency on the level of stimulation.  Cranial Nerves: II: Has trouble attending to visual fields exam. Temporal visual fields intact with no extinction to DSS. PERRL. III,IV, VI: No ptosis. EOMI. No nystagmus. V: Temp sensation subjectively equal bilaterally VII: Smile symmetric VIII: Hearing intact to voice IX,X: No hypophonia or hoarseness XI: Symmetric XII: Midline tongue extension Motor: LUE: 4+/5 RUE: 4+/5 LLE: 2-3/5 hip flexion, 4/5 knee extension, 4/5 APF. Requires repeated commands and tactile stimulation to attend.  RLE: 2-3/5 hip flexion, 4-/5 knee extension, 4/5 APF. Requires repeated commands and tactile stimulation to attend.  Sensory: Temp and FT intact x 4. No extinction to DSS. Deep Tendon Reflexes: Unable to elicit patellar reflexes. 1+ bilateral brachioradialis. No Hoffman's sign. Toes equivocal.  Cerebellar: No ataxia with FNF bilaterally. Unable to adequately perform H-S for testing.  Gait: Deferred    Lab Results: Basic Metabolic Panel: No results for input(s): "NA", "K", "CL", "CO2", "GLUCOSE", "BUN", "CREATININE", "CALCIUM", "MG", "PHOS" in the last  168 hours.  CBC: No results for input(s): "WBC", "NEUTROABS", "HGB", "HCT", "MCV", "PLT" in the last 168 hours.  Cardiac Enzymes: No results for input(s): "CKTOTAL", "CKMB", "CKMBINDEX", "TROPONINI" in the last 168 hours.  Lipid Panel: No results for input(s): "CHOL", "TRIG", "HDL", "CHOLHDL", "VLDL", "LDLCALC" in the last 168 hours.  Imaging: No results found.  Prior MRA of head and neck from 08/02/21: MRA head:  1. Atherosclerotic irregularity of the bilateral intracranial ICAs resulting in up to moderate stenosis bilaterally. No other hemodynamically significant stenosis or occlusion in the intracranial vasculature. 2. 2 mm laterally directed outpouching arising from the ophthalmic segment of the left ICA suspicious for a small aneurysm.   MRA NECK:  1. The aortic arch is not included within the field of view. The vessels of the neck are imaged from approximally the C6 level through the level of the dens. 2. Patent vasculature of the neck through the imaged portions, without evidence of hemodynamically significant stenosis, occlusion, or dissection.  Assessment: 72 y.o. female with a PMHx of cancer, CAD, HLD, HTN, seizures (on Vimpat 150 mg BID and Keppra 500 mg TID) and thyroid disease who presents to the ED via EMS as a Code Stroke after being found on the floor by family with slurred speech and diffuse weakness. She had been in the kitchen eating when last seen normal at 4:30 PM. It is unclear how she got on the floor, but per EMS there was no report or evidence of any injury to her head. She was not complaining of any headache, or of any limb or back pain. She was not incontinent of urine or bowels and no tongue-bite was noted by EMS. Vitals per EMS: BP 160/110, HR 45, 99% on RA. CBG was unremarkable per EMS. On arrival to the ED she appeared mildly drowsy and had slurred speech.  - Exam reveals dysarthria  and waxing and waning level of consciousness. No lateralized weakness or  sensory loss. Neck is supple.  - CT head: No evidence of acute intracranial abnormality. ASPECTS of 10. Mild chronic small vessel ischemic disease. - EKG: Sinus or ectopic atrial rhythm; Prolonged PR interval; Consider left atrial enlargement; LVH with secondary repolarization abnormality; Anterior Q waves, possibly due to LVH - Labs: - Glucose 98 - PT normal. PTT elevated at 48 - WBC normal. Hgb low at 10.9. Platelets normal.  - Lactate 1.2 - Na low at 129. K normal. Ca 8.7 - BUN normal. Cr elevated at 1.23. Estimated GFR 47.  - AST and ALT normal.  - DDx: - Breakthrough unwitnessed seizure with postictal state is relatively high on the DDx.  - Unwitnessed syncopal episode at home is also relatively likely.  - Overall presentation does not strongly militate in favor of stroke. TIA is possible but also felt to be relatively unlikely. - Metabolic encephalopathy work up is so far unremarkable.  - No neck stiffness, fever or white count to suggest a meningitis.   Recommendations: - Continue her home seizure medications, Keppra and Vimpat (ordered). Keppra was at 500 mg TID >> switching to 750 mg BID.  - MRI brain (ordered) - Seizure precautions - ASA 81 mg po qd (ordered) - Continue her atorvastatin - BP management per standard protocol. Hold off on permissive HTN as TIA/stroke are relatively low on the DDx - IVF - Orthostatics - Syncope work up to include cardiac telemetry and orthostatics. May need an echocardiogram.  - EEG (ordered) - Unable to obtain CTA due to iodinated contrast allergy - Ammonia level (ordered) - LVO not suspected and has had MRA of head and neck 14 months ago. Can hold off on repeat MRA unless MRI brain shows an acute stroke.    Electronically signed: Dr. Kerney Elbe 10/10/2022, 6:54 PM

## 2022-10-11 ENCOUNTER — Inpatient Hospital Stay
Admit: 2022-10-11 | Discharge: 2022-10-11 | Disposition: A | Discharge status: Home / Self Care | Payer: Medicare HMO | Attending: Family Medicine | Admitting: Family Medicine

## 2022-10-11 ENCOUNTER — Other Ambulatory Visit: Payer: Self-pay

## 2022-10-11 DIAGNOSIS — R531 Weakness: Secondary | ICD-10-CM

## 2022-10-11 DIAGNOSIS — R4781 Slurred speech: Secondary | ICD-10-CM | POA: Diagnosis not present

## 2022-10-11 DIAGNOSIS — N1832 Chronic kidney disease, stage 3b: Secondary | ICD-10-CM | POA: Diagnosis present

## 2022-10-11 DIAGNOSIS — E871 Hypo-osmolality and hyponatremia: Secondary | ICD-10-CM

## 2022-10-11 DIAGNOSIS — G459 Transient cerebral ischemic attack, unspecified: Secondary | ICD-10-CM

## 2022-10-11 DIAGNOSIS — G40909 Epilepsy, unspecified, not intractable, without status epilepticus: Secondary | ICD-10-CM | POA: Diagnosis not present

## 2022-10-11 DIAGNOSIS — R29818 Other symptoms and signs involving the nervous system: Secondary | ICD-10-CM | POA: Diagnosis not present

## 2022-10-11 HISTORY — DX: Slurred speech: R47.81

## 2022-10-11 LAB — BASIC METABOLIC PANEL
Anion gap: 5 (ref 5–15)
BUN: 18 mg/dL (ref 8–23)
CO2: 19 mmol/L — ABNORMAL LOW (ref 22–32)
Calcium: 8.1 mg/dL — ABNORMAL LOW (ref 8.9–10.3)
Chloride: 111 mmol/L (ref 98–111)
Creatinine, Ser: 1.08 mg/dL — ABNORMAL HIGH (ref 0.44–1.00)
GFR, Estimated: 55 mL/min — ABNORMAL LOW (ref >60)
Glucose, Bld: 128 mg/dL — ABNORMAL HIGH (ref 70–99)
Potassium: 3.9 mmol/L (ref 3.5–5.1)
Sodium: 135 mmol/L (ref 135–145)

## 2022-10-11 LAB — HEMOGLOBIN A1C
Hgb A1c MFr Bld: 5.1 % (ref 4.8–5.6)
Mean Plasma Glucose: 99.67 mg/dL

## 2022-10-11 LAB — CBC
HCT: 28.9 % — ABNORMAL LOW (ref 36.0–46.0)
Hemoglobin: 9.5 g/dL — ABNORMAL LOW (ref 12.0–15.0)
MCH: 28.9 pg (ref 26.0–34.0)
MCHC: 32.9 g/dL (ref 30.0–36.0)
MCV: 87.8 fL (ref 80.0–100.0)
Platelets: 150 10*3/uL (ref 150–400)
RBC: 3.29 MIL/uL — ABNORMAL LOW (ref 3.87–5.11)
RDW: 14.6 % (ref 11.5–15.5)
WBC: 5.3 10*3/uL (ref 4.0–10.5)
nRBC: 0 % (ref 0.0–0.2)

## 2022-10-11 LAB — LIPID PANEL
Cholesterol: 89 mg/dL (ref 0–200)
HDL: 28 mg/dL — ABNORMAL LOW (ref >40)
LDL Cholesterol: 54 mg/dL (ref 0–99)
Total CHOL/HDL Ratio: 3.2 RATIO
Triglycerides: 33 mg/dL (ref <150)
VLDL: 7 mg/dL (ref 0–40)

## 2022-10-11 MED ORDER — POLYETHYLENE GLYCOL 3350 17 G PO PACK
17.0000 g | PACK | Freq: Every day | ORAL | Status: DC
  Administered 2022-10-12: 17 g via ORAL
  Filled 2022-10-11 (×2): qty 1

## 2022-10-11 MED ORDER — NICOTINE 14 MG/24HR TD PT24
14.0000 mg | MEDICATED_PATCH | Freq: Every day | TRANSDERMAL | Status: DC
  Administered 2022-10-11 – 2022-10-16 (×6): 14 mg via TRANSDERMAL
  Filled 2022-10-11 (×7): qty 1

## 2022-10-11 MED ORDER — HALOPERIDOL LACTATE 5 MG/ML IJ SOLN
1.0000 mg | Freq: Four times a day (QID) | INTRAMUSCULAR | Status: DC | PRN
  Administered 2022-10-12: 1 mg via INTRAVENOUS
  Filled 2022-10-11 (×2): qty 1

## 2022-10-11 MED ORDER — LORAZEPAM 2 MG/ML IJ SOLN: 1.0000 mg | INTRAMUSCULAR | Status: DC | PRN

## 2022-10-11 MED ORDER — SENNOSIDES-DOCUSATE SODIUM 8.6-50 MG PO TABS
1.0000 | ORAL_TABLET | Freq: Two times a day (BID) | ORAL | Status: DC
  Administered 2022-10-11 – 2022-10-14 (×5): 1 via ORAL
  Filled 2022-10-11 (×8): qty 1

## 2022-10-11 NOTE — Hospital Course (Signed)
Anne Phillips is a 72 y.o. female with medical history significant for hypertension, dyslipidemia, coronary artery disease, seizure disorder breast cancer status postmastectomy tobacco use and hypothyroidism, who presented to the ER with 8 onset of fall.  The patient is been feeling generally weak and is not sure how she fell but she did not lose consciousness.  A family member was with her and left her in the kitchen and came later to find her on the floor.  She was having slurred speech then.  No paresthesias or focal muscle weakness.  She denies any head injuries or other injuries.  She thought her legs may give way.  She denies any seizures.  No urinary or stool incontinence or tongue bites. No fever/chills or infectious symptoms.  ED evaluation revealed hyponatremia with sodium 129, bicarb 19, BUN 19, Cr 1.23 (baseline), normal lactic acid.  CBC with mild anemia and no leukocytosis.  She was afebrile with elevated BP 164/76, other vitals normal.   Due to report of slurred speech and diffuse weakness, neurology was consulted and stroke work-up was initiated.  CT head w/o contrast no acute findings, showed chronic small vessel disease.    MRI brain was negative for acute stroke, showed mild atrophy seen in advanced age, remote lacunar infarcts, and chronic small vessel disease.  Patient admitted to the hospital.  Neurology was consulted.  Breakthrough seizure with postictal state versus unwitnessed syncopal episode were felt most likely etiology of her presentation.  Stroke/TIA felt much less likely.  2/14: Concern for worsening mental status/confusion/agitation per nursing.  Neurology reevaluation and repeating EEG, checking UA 2/15: UA concerning for UTI, started Keflex.  Neurology increased Vimpat to 200 mg twice daily, sodium 128, nephrology consult

## 2022-10-11 NOTE — Evaluation (Signed)
Physical Therapy Evaluation Patient Details Name: Anne Phillips MRN: [id number] DOB: December 28, 1950 Today's Date: 10/11/2022  History of Present Illness  Pt admitted to [facility] on 08/02/21 or c/o fall and hitting head, endorsing dizziness as she went to stand. Code stroke called due to dysarthria and L sided weakness. Significant PMH includes: CAD s/p PCI to RCA, HTN, HDL, HFpEF, NSTEMI, breast CA, depression, seizure disorder, and CHF. Imaging revealed small naterior inferior endplate fx at C6 but negative for acute infarct. Pt placed in C-collar and will need to remain in this until she's able to be evaluated by OP neurosurgery or orthospine. Suspect mildly elevated troponin is due to combination of mild dehydration, CKD, and underlying disease. Per neurology consult, pt symptoms believed to be diffuse ataxia secondary to medication side effects.   Clinical Impression  Pt admitted with above diagnosis. Pt received supine in bed sleeping. Awakens to voice and TC's on her arm. Pt generally groggy, difficult to arouse.  Reliant on EMR from last admission to assist in subjective reports as pt appearing a poor historian as she sometimes does not answer questions when asked. Per EMR at baseline she lives with her sister, pt able to confirm this. She reports she typically ambulates without AD, has had a couple falls in the last 6 months. When asked if she needs help with anything she only says her and her sister help each other out. She does report still driving so I anticipate pt is pretty mobile and independent at baseline.   To date, pt is mod-I for bed mobility and generally impulsive despite max VC's to redirect as PT attempts to organize [place]. Pt generally weak, reliant on max SUE support on bed rail to stand with L lateral lean. Pt is impulsive ambulating around foot of bed still requiring max VC's to decrease cadence to assist in maintaining decreased falls risk with lines/lead management but pt generally  hard to redirect. She returns impulsively lying back down into bed. Unable to really assist in further screening, only reports she feels weaker than usual. Pt re assisted sitting upright in bed with min to modA and bed features. Pt with all needs in reach. Pt currently with functional limitations due to the deficits listed below (see PT Problem List). Pt will benefit from skilled PT to increase their independence and safety with mobility to allow discharge to the venue listed below.      Recommendations for follow up therapy are one component of a multi-disciplinary discharge planning process, led by the attending physician.  Recommendations may be updated based on patient status, additional functional criteria and insurance authorization.  Follow Up Recommendations Skilled nursing-short term rehab (<3 hours/day) Can patient physically be transported by private vehicle: No    Assistance Recommended at Discharge Frequent or constant Supervision/Assistance  Patient can return home with the following  A lot of help with bathing/dressing/bathroom;A lot of help with walking and/or transfers;Assistance with cooking/housework;Help with stairs or ramp for entrance;Assist for transportation    Equipment Recommendations Other (comment) (TBD by next venue of care.)  Recommendations for Other Services       Functional Status Assessment Patient has had a recent decline in their functional status and demonstrates the ability to make significant improvements in function in a reasonable and predictable amount of time.     Precautions / Restrictions Precautions Precautions: Fall Restrictions Weight Bearing Restrictions: No      Mobility  Bed Mobility Overal bed mobility: Modified Independent  General bed mobility comments: excellent core actvation to sit EOB, increased time. Patient Response: Cooperative, Impulsive  Transfers Overall transfer level: Needs assistance Equipment used:  None Transfers: Sit to/from Stand Sit to Stand: Supervision           General transfer comment: Pt impulsively standing at EOB prior to equipment set up. Reliant on UE support on bed rail to stand.    Ambulation/Gait Ambulation/Gait assistance: Min guard Gait Distance (Feet): 12 Feet Assistive device: None Gait Pattern/deviations: Step-to pattern, Narrow base of support, Trunk flexed       General Gait Details: Pt impulsively walking around bed needing mod VC's for redirection for safety and IV pole set up. Pt generally kyphotic, variable step lengths needing heavy UE support on bed features ambulaing around bed to support herself. Pt too impulsive o attempt grabbing RW to assist in gait/mobility due to equipment management.  Stairs            Wheelchair Mobility    Modified Rankin (Stroke Patients Only)       Balance Overall balance assessment: Needs assistance Sitting-balance support: Bilateral upper extremity supported Sitting balance-Leahy Scale: Fair     Standing balance support: Single extremity supported Standing balance-Leahy Scale: Poor Standing balance comment: unable to stand without heavy SUE support                             Pertinent Vitals/Pain Pain Assessment Pain Assessment: Faces Faces Pain Scale: No hurt    Home Living Family/patient expects to be discharged to:: Private residence Living Arrangements: Other relatives (sister) Available Help at Discharge: Family Type of Home: House Home Access: Stairs to enter Entrance Stairs-Rails: Left Entrance Stairs-Number of Steps: 3-4   Home Layout: One level Home Equipment: Conservation officer, nature (2 wheels);Cane - single point;Tub bench Additional Comments: Per EMR intermittent use of AD as needed. Pt is not clear this admission on whether she uses an AD or not    Prior Function Prior Level of Function : Needs assist             Mobility Comments: Reports being Ind with ambulation  without AD; intermittent use of RW when needed. ADLs Comments: Pt unclear on how much sister assists. Pt reports she and her sister "help each other". Last admission EMR states pt was independent with ADL's and IADL's. Pt does state she is still driving.     Hand Dominance        Extremity/Trunk Assessment   Upper Extremity Assessment Upper Extremity Assessment: Defer to OT evaluation    Lower Extremity Assessment Lower Extremity Assessment: Generalized weakness    Cervical / Trunk Assessment Cervical / Trunk Assessment: Kyphotic  Communication   Communication: No difficulties  Cognition Arousal/Alertness: Lethargic Behavior During Therapy: Impulsive Overall Cognitive Status: No family/caregiver present to determine baseline cognitive functioning                                 General Comments: Pt is sleepy, difficulty awakening. Is not clear on subjective reports.        General Comments      Exercises Other Exercises Other Exercises: Role of PT in acute setting, d/c recs.   Assessment/Plan    PT Assessment Patient needs continued PT services  PT Problem List Decreased strength;Decreased activity tolerance;Decreased safety awareness;Decreased balance       PT Treatment Interventions DME  instruction;Balance training;Gait training;Neuromuscular re-education;Stair training;Functional mobility training;Patient/family education;Therapeutic activities;Therapeutic exercise    PT Goals (Current goals can be found in the Care Plan section)  Acute Rehab PT Goals Patient Stated Goal: improve her mobility. PT Goal Formulation: With patient Time For Goal Achievement: 10/25/22 Potential to Achieve Goals: Good    Frequency Min 2X/week     Co-evaluation               AM-PAC PT "6 Clicks" Mobility  Outcome Measure Help needed turning from your back to your side while in a flat bed without using bedrails?: A Little Help needed moving from lying on  your back to sitting on the side of a flat bed without using bedrails?: A Little Help needed moving to and from a bed to a chair (including a wheelchair)?: A Lot Help needed standing up from a chair using your arms (e.g., wheelchair or bedside chair)?: A Little Help needed to walk in hospital room?: A Lot Help needed climbing 3-5 steps with a railing? : A Lot 6 Click Score: 15    End of Session Equipment Utilized During Treatment: Gait belt Activity Tolerance: Patient limited by fatigue Patient left: in bed;with call bell/phone within reach;with bed alarm set Nurse Communication: Mobility status PT Visit Diagnosis: Difficulty in walking, not elsewhere classified (R26.2);Unsteadiness on feet (R26.81);Muscle weakness (generalized) (M62.81)    Time: [id number] PT Time Calculation (min) (ACUTE ONLY): 15 min   Charges:   PT Evaluation $PT Eval Moderate Complexity: [place] M. Fairly IV, PT, DPT Physical Therapist- [facility]  10/11/2022, 9:57 AM

## 2022-10-11 NOTE — Assessment & Plan Note (Addendum)
Cannot be definitively ruled out, but felt to be less likely etiology, as outlined.     See Transient Neurologic Symptoms.

## 2022-10-11 NOTE — Evaluation (Signed)
Occupational Therapy Evaluation Patient Details Name: Anne Phillips MRN: [id number] DOB: [birth year] Today's Date: 10/11/2022   History of Present Illness Per chart: Anne Phillips is a 72 y.o. female with medical history significant for hypertension, dyslipidemia, coronary artery disease, seizure disorder breast cancer status postmastectomy tobacco use and hypothyroidism, who presented to the ER with 8 onset of fall.  The patient is been feeling generally weak and is not sure how she fell but she did not lose consciousness.  A family member was with her and left her in the kitchen and came later to find her on the floor.  She was having slurred speech then.  No paresthesias or focal muscle weakness.  She denies any head injuries or other injuries.  She thought her legs may give way.  She denies any seizures.   Clinical Impression   Pt seen for OT evaluation this date.  Pt reports she was at home and was walking to the kitchen and fell, she states she does not use an assistive device at home and her sister was there and was able to call 911.  Pt reports she lives alone and has sister nearby who visits daily (she reported to PT she lives with her sister).  Pt presents with muscle weakness, decreased transfers/mobility and decreased ability to perform self care tasks.  She required min guard to supervision for transfers, min assist for lower body dressing and was inconsistent with reporting her history to therapists.  She will likely require STR prior to returning home.  She reports having a shower chair at home.  She would benefit from skilled OT services during her acute care stay to maximize her safety and independence in necessary daily tasks.        Recommendations for follow up therapy are one component of a multi-disciplinary discharge planning process, led by the attending physician.  Recommendations may be updated based on patient status, additional functional criteria and insurance  authorization.   Follow Up Recommendations  Skilled nursing-short term rehab (<3 hours/day)     Assistance Recommended at Discharge Frequent or constant Supervision/Assistance  Patient can return home with the following Assist for transportation;A little help with bathing/dressing/bathroom;A little help with walking and/or transfers    Functional Status Assessment  Patient has had a recent decline in their functional status and demonstrates the ability to make significant improvements in function in a reasonable and predictable amount of time.  Equipment Recommendations       Recommendations for Other Services       Precautions / Restrictions Precautions Precautions: Fall Restrictions Weight Bearing Restrictions: No      Mobility Bed Mobility Overal bed mobility: Modified Independent             General bed mobility comments: increased time allowed with bed mobility    Transfers Overall transfer level: Needs assistance Equipment used: Rolling walker (2 wheels) Transfers: Sit to/from Stand Sit to Stand: Min guard                  Balance Overall balance assessment: Needs assistance, Mild deficits observed, not formally tested Sitting-balance support: Bilateral upper extremity supported Sitting balance-Leahy Scale: Fair     Standing balance support: Single extremity supported Standing balance-Leahy Scale: Fair                             ADL either performed or assessed with clinical judgement   ADL Overall ADL's :  Needs assistance/impaired Eating/Feeding: Set up   Grooming: Set up   Upper Body Bathing: Set up   Lower Body Bathing: Minimal assistance   Upper Body Dressing : Set up   Lower Body Dressing: Set up;Minimal assistance   Toilet Transfer: Min guard   Toileting- Clothing Manipulation and Hygiene: Min guard       Functional mobility during ADLs: Min guard       Vision Baseline Vision/History: 1 Wears glasses        Perception     Praxis      Pertinent Vitals/Pain Pain Assessment Pain Assessment: 0-10 Pain Score: 0-No pain     Hand Dominance Right   Extremity/Trunk Assessment Upper Extremity Assessment Upper Extremity Assessment: Generalized weakness   Lower Extremity Assessment Lower Extremity Assessment: Defer to PT evaluation   Cervical / Trunk Assessment Cervical / Trunk Assessment: Kyphotic   Communication Communication Communication: No difficulties   Cognition Arousal/Alertness: Awake/alert Behavior During Therapy: Impulsive Overall Cognitive Status: No family/caregiver present to determine baseline cognitive functioning                                 General Comments: Pt was alert during session, inconsistent with responses during OT and PT evaluations.     General Comments   Pt seen this date for toileting with min guard, lower body dressing with min assist.     Exercises     Shoulder Instructions      Home Living Family/patient expects to be discharged to:: Private residence Living Arrangements: Alone Available Help at Discharge: Family Type of Home: House Home Access: Stairs to enter [employer] of Steps: 3-4 Entrance Stairs-Rails: Left Home Layout: One level     Bathroom Shower/Tub: Tub/shower unit;Curtain   Biochemist, clinical: Standard     Home Equipment: Conservation officer, nature (2 wheels);Cane - single point;Tub bench   Additional Comments: Pt reports prior to admission she ambulated without an assitive device.  She states she lives alone and has a sister nearby who comes by daily, however, she told PT her sister lives with her.  She is inconsistent with some of her responses.      Prior Functioning/Environment Prior Level of Function : Needs assist       Physical Assist : ADLs (physical)     Mobility Comments: Reports being Ind with ambulation without AD; intermittent use of RW when needed. ADLs Comments: Pt unclear on how much  sister assists. Pt reports she and her sister "help each other". Last admission EMR states pt was independent with ADL's and IADL's. Pt does state she is still driving.  Pt reports one fall in the last 12 months.        OT Problem List: Decreased strength;Decreased activity tolerance;Decreased cognition;Decreased knowledge of use of DME or AE;Impaired balance (sitting and/or standing)      OT Treatment/Interventions: Self-care/ADL training;Therapeutic exercise;DME and/or AE instruction;Therapeutic activities;Balance training;Patient/family education;Cognitive remediation/compensation    OT Goals(Current goals can be found in the care plan section) Acute Rehab OT Goals Patient Stated Goal: Pt reports she wants to return home and be able to care for herself. OT Goal Formulation: With patient Time For Goal Achievement: 10/25/22 Potential to Achieve Goals: Good ADL Goals Pt Will Perform Lower Body Dressing: with modified independence Pt Will Transfer to Toilet: with modified independence  OT Frequency: Min 2X/week    Co-evaluation  AM-PAC OT "6 Clicks" Daily Activity     Outcome Measure Help from another person eating meals?: None Help from another person taking care of personal grooming?: None Help from another person toileting, which includes using toliet, bedpan, or urinal?: A Little Help from another person bathing (including washing, rinsing, drying)?: A Little Help from another person to put on and taking off regular upper body clothing?: None Help from another person to put on and taking off regular lower body clothing?: A Little 6 Click Score: 21   End of Session Equipment Utilized During Treatment: Gait belt;Rolling walker (2 wheels)  Activity Tolerance: Patient tolerated treatment well Patient left: in bed;with bed alarm set;with call bell/phone within reach  OT Visit Diagnosis: Muscle weakness (generalized) (M62.81);History of falling (Z91.81)                 Time: [id number] OT Time Calculation (min): 24 min Charges:  OT General Charges $OT Visit: 1 Visit OT Evaluation $OT Eval Moderate Complexity: 1 Mod OT Treatments $Self Care/Home Management : 8-22 mins  Evolet Salminen T Audreanna Torrisi, OTR/L, CLT Srinika Delone 10/11/2022, 11:46 AM
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
## 2022-10-11 NOTE — TOC CM/SW Note (Signed)
CSW attempted to complete high risk assessment with patient. Patient was asleep during all attempts. TOC will follow up.  Christa See, LCSW Transitions of Care [phone]

## 2022-10-11 NOTE — Assessment & Plan Note (Signed)
Presented with sodium 129, suspect due to hypovolemia as PO intake reported to be poor at home recently. Na trend: 129 >> (after sepsis fluids) 135 >> 131. --Off IV fluids --Will hold Lasix tomorrow if not improving --Monitor BMP

## 2022-10-11 NOTE — Assessment & Plan Note (Signed)
Suspected due to post-ictal state vs unwitnessed syncopal episode.  No stroke on MRI.  Generalized weakness with no other focal deficits no consistent with CVA/TIA.

## 2022-10-11 NOTE — Assessment & Plan Note (Addendum)
Chronic, stable, no chest pain. Continue aspirin Coreg and statin.

## 2022-10-11 NOTE — Assessment & Plan Note (Addendum)
Breakthrough seizure felt to be high on the differential with presenting symptoms of postictal state. --Neurology consulted --Increased Keppra 500 >> 750 mg BID --Continue Vimpat --EEG is pending, but per Neurology could be done as outpatient if she is otherwise ready to d/c before done

## 2022-10-11 NOTE — Assessment & Plan Note (Signed)
Renal function stable. Monitor BMP. Renally dose meds & avoid nephrotoxins, hypotension  Lab Results  Component Value Date   CREATININE 1.16 (H) 10/15/2022   CREATININE 1.18 (H) 10/14/2022   CREATININE 1.14 (H) 10/13/2022

## 2022-10-11 NOTE — Assessment & Plan Note (Addendum)
-  Continue Lipitor °

## 2022-10-11 NOTE — Progress Notes (Signed)
Subjective: Resting comfortably in bed.   Objective: Current vital signs: BP 134/81 (BP Location: Right Arm)   Pulse 62   Temp 98.3 F (36.8 C)   Resp 16   Wt 60.4 kg   SpO2 100%   BMI 24.35 kg/m  Vital signs in last 24 hours: Temp:  [97.6 F (36.4 C)-98.3 F (36.8 C)] 98.3 F (36.8 C) (02/10 1622) Pulse Rate:  [60-76] 62 (02/10 1622) Resp:  [15-17] 16 (02/10 1622) BP: (111-184)/(60-93) 134/81 (02/10 1622) SpO2:  [92 %-100 %] 100 % (02/10 1622) Weight:  [60.4 kg] 60.4 kg (02/10 0027)  Intake/Output from previous day: 02/09 0701 - 02/10 0700 In: 1200 [P.O.:200; IV Piggyback:1000] Out: -  Intake/Output this shift: No intake/output data recorded. Nutritional status:  Diet Order             Diet Heart Room service appropriate? Yes; Fluid consistency: Thin  Diet effective now                  HEENT: [place]/AT Lungs: Respirations unlabored Ext: Warm and well perfused  Neurologic Exam: Mental Status: Mild drowsiness, improved since yesterday. Dysthymic affect. Oriented to the city, state and month, but gave "Friday" as the day and "1924" as the year. Speech is sparse but fluent. The dysarthria noted yesterday is now resolved. Comprehension intact; able to follow all basic motor commands. Appears mildly to moderately confused. No agitation.   Cranial Nerves: II: Has trouble attending to visual fields exam. . III,IV, VI: Tracks to left and right without difficulty.  VII: Smile symmetric VIII: Hearing intact to voice IX,X: No hypophonia or hoarseness Motor: LUE: 4/5 with poor effort RUE: 4+/5 with poor effort.  BLE: Only compliant with foot movements today. Dorsiflexes and plantar flexes with 4/5 strength and poor effort bilaterally.  Sensory: Grossly intact to touch x 4.  Cerebellar: Slow, but with no ataxia on testing of FNF bilaterally. .  Gait: Deferred    Lab Results: Results for orders placed or performed during the hospital encounter of 10/10/22 (from the  past 48 hour(s))  CBG monitoring, ED     Status: None   Collection Time: 10/10/22  6:38 PM  Result Value Ref Range   Glucose-Capillary 96 70 - 99 mg/dL    Comment: Glucose reference range applies only to samples taken after fasting for at least 8 hours.   Comment 1 Notify RN    Comment 2 Document in Chart   Lactic acid, plasma     Status: None   Collection Time: 10/10/22  7:00 PM  Result Value Ref Range   Lactic Acid, Venous 1.2 0.5 - 1.9 mmol/L    Comment: Performed at [facility], [address] [place], [place] [address]  Protime-INR     Status: None   Collection Time: 10/10/22  7:06 PM  Result Value Ref Range   Prothrombin Time 14.2 11.4 - 15.2 seconds   INR 1.1 0.8 - 1.2    Comment: (NOTE) INR goal varies based on device and disease states. Performed at [facility], [address] [place], [place] [address]   APTT     Status: Abnormal   Collection Time: 10/10/22  7:06 PM  Result Value Ref Range   aPTT 48 (H) 24 - 36 seconds    Comment:        IF BASELINE aPTT IS ELEVATED, SUGGEST PATIENT RISK ASSESSMENT BE USED TO DETERMINE APPROPRIATE ANTICOAGULANT THERAPY. Performed at [facility], [address],  [place], [place] [address]   CBC     Status: Abnormal   Collection Time: 10/10/22  7:06 PM  Result Value Ref Range   WBC 6.5 4.0 - 10.5 K/uL   RBC 3.84 (L) 3.87 - 5.11 MIL/uL   Hemoglobin 10.9 (L) 12.0 - 15.0 g/dL   HCT 34.7 (L) 36.0 - 46.0 %   MCV 90.4 80.0 - 100.0 fL   MCH 28.4 26.0 - 34.0 pg   MCHC 31.4 30.0 - 36.0 g/dL   RDW 14.6 11.5 - 15.5 %   Platelets 179 150 - 400 K/uL   nRBC 0.0 0.0 - 0.2 %    Comment: Performed at [facility], [address] [place], [place] [address]  Differential     Status: Abnormal   Collection Time: 10/10/22  7:06 PM  Result Value Ref Range   Neutrophils Relative % 76 %   Neutro Abs 5.0 1.7 - 7.7 K/uL   Lymphocytes Relative 9 %   Lymphs Abs 0.6 (L) 0.7 - 4.0 K/uL   Monocytes Relative  8 %   Monocytes Absolute 0.5 0.1 - 1.0 K/uL   Eosinophils Relative 5 %   Eosinophils Absolute 0.3 0.0 - 0.5 K/uL   Basophils Relative 1 %   Basophils Absolute 0.1 0.0 - 0.1 K/uL   Immature Granulocytes 1 %   Abs Immature Granulocytes 0.03 0.00 - 0.07 K/uL    Comment: Performed at [facility], [address] [place], [place] [address]  Comprehensive metabolic panel     Status: Abnormal   Collection Time: 10/10/22  7:06 PM  Result Value Ref Range   Sodium 129 (L) 135 - 145 mmol/L   Potassium 4.5 3.5 - 5.1 mmol/L   Chloride 102 98 - 111 mmol/L   CO2 19 (L) 22 - 32 mmol/L   Glucose, Bld 98 70 - 99 mg/dL    Comment: Glucose reference range applies only to samples taken after fasting for at least 8 hours.   BUN 19 8 - 23 mg/dL   Creatinine, Ser 1.23 (H) 0.44 - 1.00 mg/dL   Calcium 8.7 (L) 8.9 - 10.3 mg/dL   Total Protein 7.7 6.5 - 8.1 g/dL   Albumin 3.9 3.5 - 5.0 g/dL   AST 15 15 - 41 U/L   ALT 8 0 - 44 U/L   Alkaline Phosphatase 132 (H) 38 - 126 U/L   Total Bilirubin 0.5 0.3 - 1.2 mg/dL   GFR, Estimated 47 (L) >60 mL/min    Comment: (NOTE) Calculated using the CKD-EPI Creatinine Equation (2021)    Anion gap 8 5 - 15    Comment: Performed at [facility], [address] [place], [place] [address]  Ethanol     Status: None   Collection Time: 10/10/22  7:06 PM  Result Value Ref Range   Alcohol, Ethyl (B) <10 <10 mg/dL    Comment: (NOTE) Lowest detectable limit for serum alcohol is 10 mg/dL.  For medical purposes only. Performed at [facility], [address] [place], [place] [address]   Hemoglobin A1c     Status: None   Collection Time: 10/10/22  7:06 PM  Result Value Ref Range   Hgb A1c MFr Bld 5.1 4.8 - 5.6 %    Comment: (NOTE) Pre diabetes:          5.7%-6.4%  Diabetes:              >6.4%  Glycemic control for   <7.0% adults with  diabetes    Mean Plasma Glucose 99.67 mg/dL    Comment: Performed at [facility], [place]  [address], [place], [place] [address]  Ammonia     Status: None   Collection Time: 10/10/22 11:13 PM  Result Value Ref Range   Ammonia 20 9 - 35 umol/L    Comment: HEMOLYSIS AT THIS LEVEL MAY AFFECT RESULT Performed at [facility], [address] [place], [place] [address]   Lipid panel     Status: Abnormal   Collection Time: 10/11/22  6:44 AM  Result Value Ref Range   Cholesterol 89 0 - 200 mg/dL   Triglycerides 33 <150 mg/dL   HDL 28 (L) >40 mg/dL   Total CHOL/HDL Ratio 3.2 RATIO   VLDL 7 0 - 40 mg/dL   LDL Cholesterol 54 0 - 99 mg/dL    Comment:        Total Cholesterol/HDL:CHD Risk Coronary Heart Disease Risk Table                     Men   Women  1/2 Average Risk   3.4   3.3  Average Risk       5.0   4.4  2 X Average Risk   9.6   7.1  3 X Average Risk  23.4   11.0        Use the calculated Patient Ratio above and the CHD Risk Table to determine the patient's CHD Risk.        ATP III CLASSIFICATION (LDL):  <100     mg/dL   Optimal  100-129  mg/dL   Near or Above                    Optimal  130-159  mg/dL   Borderline  160-189  mg/dL   High  >190     mg/dL   Very High Performed at [facility], [address] [place], [place] [id number]   Basic metabolic panel     Status: Abnormal   Collection Time: 10/11/22  6:44 AM  Result Value Ref Range   Sodium 135 135 - 145 mmol/L   Potassium 3.9 3.5 - 5.1 mmol/L   Chloride 111 98 - 111 mmol/L   CO2 19 (L) 22 - 32 mmol/L   Glucose, Bld 128 (H) 70 - 99 mg/dL    Comment: Glucose reference range applies only to samples taken after fasting for at least 8 hours.   BUN 18 8 - 23 mg/dL   Creatinine, Ser 1.08 (H) 0.44 - 1.00 mg/dL   Calcium 8.1 (L) 8.9 - 10.3 mg/dL   GFR, Estimated 55 (L) >60 mL/min    Comment: (NOTE) Calculated using the CKD-EPI Creatinine Equation (2021)    Anion gap 5 5 - 15    Comment: Performed at [facility], [address] [place], [place] [address]  CBC     Status: Abnormal    Collection Time: 10/11/22  6:44 AM  Result Value Ref Range   WBC 5.3 4.0 - 10.5 K/uL   RBC 3.29 (L) 3.87 - 5.11 MIL/uL   Hemoglobin 9.5 (L) 12.0 - 15.0 g/dL   HCT 28.9 (L) 36.0 - 46.0 %   MCV 87.8 80.0 - 100.0 fL   MCH 28.9 26.0 - 34.0 pg   MCHC 32.9 30.0 - 36.0 g/dL   RDW 14.6 11.5 - 15.5 %   Platelets 150 150 - 400 K/uL  nRBC 0.0 0.0 - 0.2 %    Comment: Performed at [facility], [address] [place], [place] [address]    No results found for this or any previous visit (from the past 240 hour(s)).  Lipid Panel Recent Labs    10/11/22 0644  CHOL 89  TRIG 33  HDL 28*  CHOLHDL 3.2  VLDL 7  LDLCALC 54    Studies/Results: MR BRAIN WO CONTRAST  Result Date: 10/10/2022 CLINICAL DATA:  Initial evaluation for neuro deficit, stroke suspected. EXAM: MRI HEAD WITHOUT CONTRAST TECHNIQUE: Multiplanar, multiecho pulse sequences of the brain and surrounding structures were obtained without intravenous contrast. COMPARISON:  Prior CT from earlier the same day. FINDINGS: Brain: Examination mildly degraded by motion artifact. Mild age-related cerebral atrophy with chronic small vessel ischemic disease. Few small remote lacunar infarcts noted about the thalami. No abnormal foci of restricted diffusion to suggest acute or subacute ischemia. Gray-white matter differentiation maintained. No areas of chronic cortical infarction. No acute intracranial hemorrhage. Single chronic microhemorrhage noted at the left occipital region, of doubtful significance in isolation. No mass lesion, midline shift or mass effect. No hydrocephalus or extra-axial fluid collection. Pituitary gland and suprasellar region within normal limits. Vascular: Major intracranial vascular flow voids are maintained. Skull and upper cervical spine: Craniocervical junction within normal limits. Bone marrow signal intensity diffusely heterogeneous without worrisome osseous lesion. No scalp soft tissue abnormality.  Sinuses/Orbits: Globes and orbital soft tissues within normal limits. Paranasal sinuses are largely clear. No significant mastoid effusion. Other: None. IMPRESSION: 1. No acute intracranial abnormality. 2. Mild age-related cerebral atrophy with chronic small vessel ischemic disease, with a few small remote lacunar infarcts about the thalami. Electronically Signed   By: Jeannine Boga M.D.   On: 10/10/2022 22:51   CT HEAD CODE STROKE WO CONTRAST  Result Date: 10/10/2022 CLINICAL DATA:  Code stroke.  Slurred speech and facial droop. EXAM: CT HEAD WITHOUT CONTRAST TECHNIQUE: Contiguous axial images were obtained from the base of the skull through the vertex without intravenous contrast. RADIATION DOSE REDUCTION: This exam was performed according to the departmental dose-optimization program which includes automated exposure control, adjustment of the mA and/or kV according to patient size and/or use of iterative reconstruction technique. COMPARISON:  Head CT 10/25/2021 and MRI 08/02/2021 FINDINGS: Brain: There is no evidence of an acute infarct, intracranial hemorrhage, mass, midline shift, or extra-axial fluid collection. A small chronic cortical/subcortical infarct in the right parietal lobe is unchanged. Hypodensities elsewhere in the cerebral white matter bilaterally are also unchanged and nonspecific but compatible with mild chronic small vessel ischemic disease. There is a chronic lacunar infarct in the right thalamus. Mild cerebral atrophy is within normal limits for age. Vascular: Calcified atherosclerosis at the skull base. No hyperdense vessel. Skull: No acute fracture or suspicious osseous lesion. Sinuses/Orbits: Visualized paranasal sinuses and mastoid air cells are clear. Unremarkable orbits. Other: None. ASPECTS [facility] Stroke Program Early CT Score) - Ganglionic level infarction (caudate, lentiform nuclei, internal capsule, insula, M1-M3 cortex): 7 - Supraganglionic infarction (M4-M6 cortex): 3  Total score (0-10 with 10 being normal): 10 IMPRESSION: 1. No evidence of acute intracranial abnormality. ASPECTS of 10. 2. Mild chronic small vessel ischemic disease. These results were communicated to Dr. Cheral Marker at 7:07 pm on 10/10/2022 by text page via the [facility] messaging system. Electronically Signed   By: Logan Bores M.D.   On: 10/10/2022 19:07    Medications: Scheduled:  aspirin EC  81 mg Oral Daily   atorvastatin  40  mg Oral Daily   carvedilol  12.5 mg Oral BID WC   cyanocobalamin  1,000 mcg Oral Daily   empagliflozin  10 mg Oral Daily   enoxaparin (LOVENOX) injection  40 mg Subcutaneous Q24H   furosemide  20 mg Oral Daily   gabapentin  300 mg Oral BID   lacosamide  150 mg Oral BID   levETIRAcetam  750 mg Oral BID   losartan  100 mg Oral Daily   nicotine  14 mg Transdermal Daily   polyethylene glycol  17 g Oral Daily   senna-docusate  1 tablet Oral BID   spironolactone  12.5 mg Oral Daily    Assessment: 73 y.o. female with a PMHx of cancer, CAD, HLD, HTN, seizures (on Vimpat 150 mg BID and Keppra 500 mg TID) and thyroid disease who presents to the ED via EMS as a Code Stroke after being found on the floor by family with slurred speech and diffuse weakness.  - Exam reveals a poorly cooperative patient with improved level of alertness, resolved dysarthria and poor cooperativeness. Her LUE is slightly weaker than the right, which is felt most likely to be secondary to poor effort.   - CT head: No evidence of acute intracranial abnormality. ASPECTS of 10. Mild chronic small vessel ischemic disease. - EKG: Sinus or ectopic atrial rhythm; Prolonged PR interval; Consider left atrial enlargement; LVH with secondary repolarization abnormality; Anterior Q waves, possibly due to LVH - MRI brain: No acute intracranial abnormality. Mild age-related cerebral atrophy with chronic small vessel ischemic disease, with a few small remote lacunar infarcts about the thalami. - Ammonia level normal at  20 - Labs were unrevealing regarding the underlying etiology for her presentation with hypoactive delirium after a fall at home: - DDx: - Breakthrough unwitnessed seizure with postictal state is relatively high on the DDx.  - Unwitnessed syncopal episode at home is also relatively likely.  - TIA is possible but felt to be unlikely. - Metabolic encephalopathy was a consideration, but is now lower on the DDx as work up is so far unremarkable.  - No neck stiffness, fever or white count to suggest a meningitis.    Recommendations: - Continue her home seizure medications, Keppra and Vimpat (ordered). Keppra was at 500 mg TID >> switching to 750 mg BID (same total daily dose, but switched to the standard BID frequency for this medication).  - Seizure precautions - ASA 81 mg po qd and continue her atorvastatin - BP management per standard protocol.  - IVF - Orthostatics - Syncope work up to include cardiac telemetry and orthostatics. May need an echocardiogram.  - EEG on Monday - Unable to obtain CTA due to iodinated contrast allergy     LOS: 1 day   @Electronically$  signed: Dr. Kerney Elbe 10/11/2022  7:52 PM

## 2022-10-11 NOTE — Assessment & Plan Note (Addendum)
Per chart review - Echo Sept 2023 with EF 55-60%, grade II diastolic dysfunction. Continue her Jardiance, Aldactone and Cozaar. Hold Lasix & monitor for Na improvement Daily weights

## 2022-10-11 NOTE — Assessment & Plan Note (Addendum)
Slurred speech and Generalized Weakness Stroke ruled out - MRI negative.  TIA also felt less likely given presentation of diffuse generalized weakness. --Neurology will reevaluate today to decide medication adjustment for AED --Breakthrough seizure vs unwitnessed syncopal episode --Keppra 750 mg BID -Getting repeat EEG, checking UA today due to concern for altered mental status --Seen by PT, OT, SLP / SNF recommended at d/c --Per neurology, no need for vascular imaging given no acute stroke on MRI brain and vessel imaging done 14 months ago. --Orthostatic vitals --Continue daily aspirin, Lipitor --No need for permissive HTN --EEG negative for seizure activity, showed diffuse slowing

## 2022-10-11 NOTE — Progress Notes (Signed)
Patient's bed alarm sounding. Entered room and patient noted to be ambulating with VERY unsteady gait. Leaning backward. Patient assisted to [facility]. Patient lacks safety awareness. Patient rose from [facility] after being told to wait for assistance and climbed into bed on knees, before throwing herself down into side lying position. Patient's lower half missed bed and landed on RN's thighs. Patient reminded assistance was needed with mobility and she needed to call for help.

## 2022-10-11 NOTE — Progress Notes (Addendum)
Progress Note   Patient: Anne Phillips [id number] DOB: 1951-01-11 DOA: 10/10/2022     1 DOS: the patient was seen and examined on 10/11/2022   Brief hospital course: DELLENA ZEMP is a 72 y.o. female with medical history significant for hypertension, dyslipidemia, coronary artery disease, seizure disorder breast cancer status postmastectomy tobacco use and hypothyroidism, who presented to the ER with 8 onset of fall.  The patient is been feeling generally weak and is not sure how she fell but she did not lose consciousness.  A family member was with her and left her in the kitchen and came later to find her on the floor.  She was having slurred speech then.  No paresthesias or focal muscle weakness.  She denies any head injuries or other injuries.  She thought her legs may give way.  She denies any seizures.  No urinary or stool incontinence or tongue bites. No fever/chills or infectious symptoms.  ED evaluation revealed hyponatremia with sodium 129, bicarb 19, BUN 19, Cr 1.23 (baseline), normal lactic acid.  CBC with mild anemia and no leukocytosis.  She was afebrile with elevated BP 164/76, other vitals normal.   Due to report of slurred speech and diffuse weakness, neurology was consulted and stroke work-up was initiated.  CT head w/o contrast no acute findings, showed chronic small vessel disease.    MRI brain was negative for acute stroke, showed mild atrophy seen in advanced age, remote lacunar infarcts, and chronic small vessel disease.  Patient admitted to the hospital.  Neurology was consulted.  Breakthrough seizure with postictal state versus unwitnessed syncopal episode were felt most likely etiology of her presentation.  Stroke/TIA felt much less likely.    Assessment and Plan: * Transient neurological symptoms Slurred speech and Generalized Weakness Stroke ruled out - MRI negative.  TIA also felt less likely given presentation of diffuse generalized weakness. --Neurology  consulted --Breakthrough seizure vs unwitnessed syncopal episode --Keppra dose increased 500 >> 750 mg BID --Follow up PT, OT, SLP recs --Per neurology, no need for vascular imaging given no acute stroke on MRI brain and vessel imaging done 14 months ago. --Orthostatic vitals --Telemetry monitoring --Continue daily aspirin, Lipitor --No need for permissive HTN --Continue IV fluids --?Echo --Follow pending EEG  Hyponatremia Presented with sodium 129, suspect due to hypovolemia as PO intake reported to be poor at home recently. Na improving: 129 >> 135. --Continue gentle fluids until PO intake improves --Monitor BMP  Seizure disorder (HCC) Breakthrough seizure felt to be high on the differential with presenting symptoms of postictal state. --Neurology consulted --Increased Keppra 500 >> 750 mg BID --Continue Vimpat --EEG is pending  Chronic kidney disease, stage 3b (HCC) Renal function stable. Monitor BMP. Renally dose meds & avoid nephrotoxins, hypotension  Weakness PT/OT evaluations. SNF recommended. TOC consulted.  Slurred speech Suspected due to post-ictal state vs unwitnessed syncopal episode.  No stroke on MRI.  Generalized weakness with no other focal deficits no consistent with CVA/TIA.   TIA (transient ischemic attack) Cannot be definitively ruled out, but felt to be less likely etiology, as outlined.     See Transient Neurologic Symptoms   Chronic diastolic CHF (congestive heart failure) (HCC) Continue her Jardiance, Aldactone and Cozaar  Coronary artery disease Chronic, stable, no chest pain. Continue aspirin Coreg and statin.  Dyslipidemia Continue Lipitor   Hypertension Continue home medications. No need for permissive HTN.        Subjective: Pt was sleeping comfortably but woke to voice when  seen this AM.  She does not open her eyes but does respond to questions.  Reports being very weak and tired.  No other acute complaints.  No acute events  reported.   Physical Exam: Vitals:   10/11/22 0027 10/11/22 0610 10/11/22 0716 10/11/22 1205  BP: (!) 155/71 126/60 (!) 140/74 111/71  Pulse: 68 67 64 60  Resp: 16 16 16 16  $ Temp: 97.7 F (36.5 C)  98.1 F (36.7 C) 97.7 F (36.5 C)  TempSrc:   Oral   SpO2: 100% 100% 100% 100%  Weight: 60.4 kg      General exam: somnolent but responsive, no acute distress HEENT: atraumatic, keeps eyes closed, moist mucus membranes, hearing grossly normal  Respiratory system: CTAB diminished bases due to poor inspirations, no wheezes, rales or rhonchi, normal respiratory effort. Cardiovascular system: normal S1/S2, RRR, no pedal edema.   Gastrointestinal system: soft, NT, ND, no HSM felt, +bowel sounds. Central nervous system: A&O x self place at least. no gross focal neurologic deficits, normal speech Extremities: no edema, normal tone Skin: dry, intact, normal temperature Psychiatry: normal mood, congruent affect, unable to assess judgement and insight at this due to pt minimally interactive  Data Reviewed:  Notable labs --- Na improved 129 >> 135.  Glucose 129.  Bicarb unchanged at 19.  Lipid panel normal except low HDL 28. CBC no leukocytosis, Hbg 9.5 from 10.9 (dilutional from IV fluids) Hbg A1c 5.1%  Family Communication: None present. Will attempt to call.   Disposition: Status is: Inpatient Remains inpatient appropriate because: Ongoing evaluation as outlined above   Planned Discharge Destination: Home health vs SNF (recommended)    Time spent: 42 minutes  Author: Ezekiel Slocumb, DO 10/11/2022 12:26 PM  For on call review [email or web address].

## 2022-10-11 NOTE — Assessment & Plan Note (Signed)
PT/OT evaluations. SNF recommended. TOC consulted.

## 2022-10-11 NOTE — Assessment & Plan Note (Addendum)
Continue home medications. No need for permissive HTN.

## 2022-10-11 NOTE — Plan of Care (Signed)

## 2022-10-12 DIAGNOSIS — R410 Disorientation, unspecified: Secondary | ICD-10-CM

## 2022-10-12 DIAGNOSIS — R29818 Other symptoms and signs involving the nervous system: Secondary | ICD-10-CM | POA: Diagnosis not present

## 2022-10-12 DIAGNOSIS — E871 Hypo-osmolality and hyponatremia: Secondary | ICD-10-CM | POA: Diagnosis not present

## 2022-10-12 DIAGNOSIS — I5032 Chronic diastolic (congestive) heart failure: Secondary | ICD-10-CM | POA: Diagnosis not present

## 2022-10-12 LAB — BASIC METABOLIC PANEL
Anion gap: 4 — ABNORMAL LOW (ref 5–15)
BUN: 15 mg/dL (ref 8–23)
CO2: 18 mmol/L — ABNORMAL LOW (ref 22–32)
Calcium: 8.6 mg/dL — ABNORMAL LOW (ref 8.9–10.3)
Chloride: 109 mmol/L (ref 98–111)
Creatinine, Ser: 1.11 mg/dL — ABNORMAL HIGH (ref 0.44–1.00)
GFR, Estimated: 53 mL/min — ABNORMAL LOW (ref >60)
Glucose, Bld: 89 mg/dL (ref 70–99)
Potassium: 4.2 mmol/L (ref 3.5–5.1)
Sodium: 131 mmol/L — ABNORMAL LOW (ref 135–145)

## 2022-10-12 LAB — MAGNESIUM: Magnesium: 1.7 mg/dL (ref 1.7–2.4)

## 2022-10-12 LAB — PHOSPHORUS: Phosphorus: 2.8 mg/dL (ref 2.5–4.6)

## 2022-10-12 MED ORDER — ADULT MULTIVITAMIN W/MINERALS CH
1.0000 | ORAL_TABLET | Freq: Every day | ORAL | Status: DC
  Administered 2022-10-12 – 2022-10-17 (×6): 1 via ORAL
  Filled 2022-10-12 (×6): qty 1

## 2022-10-12 MED ORDER — ENSURE ENLIVE PO LIQD
237.0000 mL | ORAL | Status: DC
  Administered 2022-10-12 – 2022-10-13 (×2): 237 mL via ORAL

## 2022-10-12 NOTE — Progress Notes (Signed)
   10/12/22 1100  Spiritual Encounters  Type of Visit Initial  Care provided to: Patient  Referral source Nurse (RN/NT/LPN)  Reason for visit Routine spiritual support  OnCall Visit Yes  Spiritual Framework  Presenting Themes Courage hope and growth   Chaplain responded to [facility] consult requesting prayer. Chaplain intervened through prayer, meaningful conversation and reflective listening.

## 2022-10-12 NOTE — Progress Notes (Signed)
Patient refusing tele. MD made aware.

## 2022-10-12 NOTE — Assessment & Plan Note (Addendum)
Likely hospital delirium in elderly patient with ?underlying mild cognitive impairment, unknown if dementia history. Possibly related to nicotine withdrawal - seems doing better since nicotine patch placed. --Delirium precautions --Very low dose IV haldol PRN if persistent agitation and not redirectable --Address bowel, bladder, pain control etc before giving PRN for agitation --Nicotine patch
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
## 2022-10-12 NOTE — Progress Notes (Signed)
Initial Nutrition Assessment RD working remotely.   DOCUMENTATION CODES:   Not applicable  INTERVENTION:  - ordered Ensure Plus High Protein once/day, each supplement provides 350 kcal and 20 grams of protein.  - ordered 1 tablet multivitamin with minerals/day.  - complete NFPE when feasible.   NUTRITION DIAGNOSIS:   Increased nutrient needs related to acute illness as evidenced by estimated needs.  GOAL:   Patient will meet greater than or equal to 90% of their needs  MONITOR:   PO intake, Supplement acceptance, Labs, Weight trends, I & O's  REASON FOR ASSESSMENT:   Malnutrition Screening Tool  ASSESSMENT:   72 y.o. female with medical history of HTN, dyslipidemia, CAD, seizure disorder, breast cancer s/p mastectomy, tobacco use, and hypothyroidism. She presented to the ED after a fall and slurred speech noted by a family member. She has been experiencing generalized weakness.  MST of 2.0. Diet advanced to Heart Healthy, thin liquids on 2/9 night following SLP evaluation. No meal completion percentages documented in the flow sheet.  Patient noted to be a/o to self and place. She was OOB yesterday and noted to be very unsteady on her feet.   She has not been assessed by a [place] RD since 08/2015.  Weight yesterday was 133 lb and is the highest weight over the past 1 year. No information documented in the edema section of flow sheet.   Labs reviewed; Na: 131 mmol/l, creatinine: 1.11 mg/dl, Ca: 8.6 mg/dl, GFR: 53 ml/min.  Medications reviewed; 1000 mcg oral cyanocobalamin/day, 10 mg jardiance/day, 20 mg oral lasix/day, 17 g miralax/day, 1 tablet senokot BID, 12.5 mg aldactone/day.    NUTRITION - FOCUSED PHYSICAL EXAM:  RD working remotely.  Diet Order:   Diet Order             Diet Heart Room service appropriate? Yes; Fluid consistency: Thin  Diet effective now                   EDUCATION NEEDS:   Not appropriate for education at this  time  Skin:  Skin Assessment: Reviewed RN Assessment  Last BM:  2/10 (type 1 x1. small amount)  Height:   Ht Readings from Last 1 Encounters:  03/06/22 5' 2"$  (1.575 m)    Weight:   Wt Readings from Last 1 Encounters:  10/11/22 60.4 kg    BMI:  Body mass index is 24.35 kg/m.  Estimated Nutritional Needs:  Kcal:  1500-1700 kcal Protein:  75-85 grams Fluid:  >/= 1.5 L/day     Jarome Matin, MS, RD, LDN, CNSC Clinical Dietitian PRN/Relief staff On-call/weekend pager # available in [facility]

## 2022-10-12 NOTE — Progress Notes (Signed)
Mobility Specialist - Progress Note     10/12/22 1005  Mobility  Activity Ambulated with assistance to bathroom;Stood at bedside  Level of Assistance Contact guard assist, steadying assist  Assistive Device Other (Comment) (HHA)  Distance Ambulated (ft) 10 ft  Activity Response Tolerated well  Mobility Referral Yes  $Mobility charge 1 Mobility   Author responding to bed alarm. Pt OOB upon arrival requesting to go to the bathroom. Pt STS and ambulates to bathroom HHA CGA. Pt returned to bed and given education on not getting out of bed without someone present due to fall risk. Pt left with needs in reach and bed alarm activated.   Loma Sender Mobility Specialist 10/12/22, 10:08 AM

## 2022-10-12 NOTE — TOC CM/SW Note (Signed)
Went by room to talk with patient about SNF recommendation but she was in the bathroom. Will try again later.  Dayton Scrape, [place]

## 2022-10-12 NOTE — Plan of Care (Signed)

## 2022-10-12 NOTE — Progress Notes (Signed)
Subjective: Drowsiness resolved today. Wants her stool softener discontinued as it causes diarrhea.   Objective: Current vital signs: BP (!) 149/67 (BP Location: Right Arm)   Pulse 67   Temp 98 F (36.7 C)   Resp 16   Wt 60.4 kg   SpO2 96%   BMI 24.35 kg/m  Vital signs in last 24 hours: Temp:  [97.6 F (36.4 C)-98.3 F (36.8 C)] 98 F (36.7 C) (02/11 0809) Pulse Rate:  [60-71] 67 (02/11 0809) Resp:  [16-18] 16 (02/11 0809) BP: (111-164)/(67-92) 149/67 (02/11 0809) SpO2:  [96 %-100 %] 96 % (02/11 0809)  Intake/Output from previous day: 02/10 0701 - 02/11 0700 In: -  Out: 600 [Urine:600] Intake/Output this shift: No intake/output data recorded. Nutritional status:  Diet Order             Diet Heart Room service appropriate? Yes; Fluid consistency: Thin  Diet effective now                   HEENT: [place]/AT Lungs: Respirations unlabored Ext: Warm and well perfused   Neurologic Exam:  Mental Status: Improved today. Awake and alert. Oriented x 4, only missing the day. Knows why she is in the hospital. Affect brighter today and she is also more cooperative. Speech is fluent with intact comprehension. No word finding difficulty. No dysarthria.    Cranial Nerves: II, III,IV, VI: Fixates and tracks normally.  VII: Smile symmetric VIII: Hearing intact to voice IX,X: No hypophonia or hoarseness Motor: BUE 5/5 proximally and distally without asymmetry  BLE: Dorsiflexes and plantar flexes with 4/5 strength and poor effort bilaterally. Will extend at the knees with 4-/5 strength on the left and 3/5 on the right, stating with the latter that leg pain is preventing her from giving full effort.  Sensory: Grossly intact to touch x 4.  Cerebellar: No ataxia noted.  Gait: Deferred  Lab Results: Results for orders placed or performed during the hospital encounter of 10/10/22 (from the past 48 hour(s))  CBG monitoring, ED     Status: None   Collection Time: 10/10/22  6:38 PM   Result Value Ref Range   Glucose-Capillary 96 70 - 99 mg/dL    Comment: Glucose reference range applies only to samples taken after fasting for at least 8 hours.   Comment 1 Notify RN    Comment 2 Document in Chart   Lactic acid, plasma     Status: None   Collection Time: 10/10/22  7:00 PM  Result Value Ref Range   Lactic Acid, Venous 1.2 0.5 - 1.9 mmol/L    Comment: Performed at [facility], [address] [place], [place] [address]  Protime-INR     Status: None   Collection Time: 10/10/22  7:06 PM  Result Value Ref Range   Prothrombin Time 14.2 11.4 - 15.2 seconds   INR 1.1 0.8 - 1.2    Comment: (NOTE) INR goal varies based on device and disease states. Performed at [facility], [address] [place], [place] [address]   APTT     Status: Abnormal   Collection Time: 10/10/22  7:06 PM  Result Value Ref Range   aPTT 48 (H) 24 - 36 seconds    Comment:        IF BASELINE aPTT IS ELEVATED, SUGGEST PATIENT RISK ASSESSMENT BE USED TO DETERMINE APPROPRIATE ANTICOAGULANT THERAPY. Performed at [facility], [address], [place],  [address]   CBC  Status: Abnormal   Collection Time: 10/10/22  7:06 PM  Result Value Ref Range   WBC 6.5 4.0 - 10.5 K/uL   RBC 3.84 (L) 3.87 - 5.11 MIL/uL   Hemoglobin 10.9 (L) 12.0 - 15.0 g/dL   HCT 34.7 (L) 36.0 - 46.0 %   MCV 90.4 80.0 - 100.0 fL   MCH 28.4 26.0 - 34.0 pg   MCHC 31.4 30.0 - 36.0 g/dL   RDW 14.6 11.5 - 15.5 %   Platelets 179 150 - 400 K/uL   nRBC 0.0 0.0 - 0.2 %    Comment: Performed at [facility], [address] [place], [place] [address]  Differential     Status: Abnormal   Collection Time: 10/10/22  7:06 PM  Result Value Ref Range   Neutrophils Relative % 76 %   Neutro Abs 5.0 1.7 - 7.7 K/uL   Lymphocytes Relative 9 %   Lymphs Abs 0.6 (L) 0.7 - 4.0 K/uL   Monocytes Relative 8 %   Monocytes Absolute 0.5 0.1 - 1.0 K/uL   Eosinophils Relative 5 %   Eosinophils  Absolute 0.3 0.0 - 0.5 K/uL   Basophils Relative 1 %   Basophils Absolute 0.1 0.0 - 0.1 K/uL   Immature Granulocytes 1 %   Abs Immature Granulocytes 0.03 0.00 - 0.07 K/uL    Comment: Performed at [facility], [address] [place], [place] [address]  Comprehensive metabolic panel     Status: Abnormal   Collection Time: 10/10/22  7:06 PM  Result Value Ref Range   Sodium 129 (L) 135 - 145 mmol/L   Potassium 4.5 3.5 - 5.1 mmol/L   Chloride 102 98 - 111 mmol/L   CO2 19 (L) 22 - 32 mmol/L   Glucose, Bld 98 70 - 99 mg/dL    Comment: Glucose reference range applies only to samples taken after fasting for at least 8 hours.   BUN 19 8 - 23 mg/dL   Creatinine, Ser 1.23 (H) 0.44 - 1.00 mg/dL   Calcium 8.7 (L) 8.9 - 10.3 mg/dL   Total Protein 7.7 6.5 - 8.1 g/dL   Albumin 3.9 3.5 - 5.0 g/dL   AST 15 15 - 41 U/L   ALT 8 0 - 44 U/L   Alkaline Phosphatase 132 (H) 38 - 126 U/L   Total Bilirubin 0.5 0.3 - 1.2 mg/dL   GFR, Estimated 47 (L) >60 mL/min    Comment: (NOTE) Calculated using the CKD-EPI Creatinine Equation (2021)    Anion gap 8 5 - 15    Comment: Performed at [facility], [address] [place], [place] [address]  Ethanol     Status: None   Collection Time: 10/10/22  7:06 PM  Result Value Ref Range   Alcohol, Ethyl (B) <10 <10 mg/dL    Comment: (NOTE) Lowest detectable limit for serum alcohol is 10 mg/dL.  For medical purposes only. Performed at [facility], [address] [place], [place] [address]   Hemoglobin A1c     Status: None   Collection Time: 10/10/22  7:06 PM  Result Value Ref Range   Hgb A1c MFr Bld 5.1 4.8 - 5.6 %    Comment: (NOTE) Pre diabetes:          5.7%-6.4%  Diabetes:              >6.4%  Glycemic control for   <7.0% adults with diabetes    Mean Plasma Glucose 99.67 mg/dL  Comment: Performed at [facility], [address] [place], [place] [address]  Ammonia     Status: None   Collection Time: 10/10/22  11:13 PM  Result Value Ref Range   Ammonia 20 9 - 35 umol/L    Comment: HEMOLYSIS AT THIS LEVEL MAY AFFECT RESULT Performed at [facility], [address] [place], [place] [address]   Lipid panel     Status: Abnormal   Collection Time: 10/11/22  6:44 AM  Result Value Ref Range   Cholesterol 89 0 - 200 mg/dL   Triglycerides 33 <150 mg/dL   HDL 28 (L) >40 mg/dL   Total CHOL/HDL Ratio 3.2 RATIO   VLDL 7 0 - 40 mg/dL   LDL Cholesterol 54 0 - 99 mg/dL    Comment:        Total Cholesterol/HDL:CHD Risk Coronary Heart Disease Risk Table                     Men   Women  1/2 Average Risk   3.4   3.3  Average Risk       5.0   4.4  2 X Average Risk   9.6   7.1  3 X Average Risk  23.4   11.0        Use the calculated Patient Ratio above and the CHD Risk Table to determine the patient's CHD Risk.        ATP III CLASSIFICATION (LDL):  <100     mg/dL   Optimal  100-129  mg/dL   Near or Above                    Optimal  130-159  mg/dL   Borderline  160-189  mg/dL   High  >190     mg/dL   Very High Performed at [facility], [address] [place], [place] [id number]   Basic metabolic panel     Status: Abnormal   Collection Time: 10/11/22  6:44 AM  Result Value Ref Range   Sodium 135 135 - 145 mmol/L   Potassium 3.9 3.5 - 5.1 mmol/L   Chloride 111 98 - 111 mmol/L   CO2 19 (L) 22 - 32 mmol/L   Glucose, Bld 128 (H) 70 - 99 mg/dL    Comment: Glucose reference range applies only to samples taken after fasting for at least 8 hours.   BUN 18 8 - 23 mg/dL   Creatinine, Ser 1.08 (H) 0.44 - 1.00 mg/dL   Calcium 8.1 (L) 8.9 - 10.3 mg/dL   GFR, Estimated 55 (L) >60 mL/min    Comment: (NOTE) Calculated using the CKD-EPI Creatinine Equation (2021)    Anion gap 5 5 - 15    Comment: Performed at [facility], [address] [place], [place] [address]  CBC     Status: Abnormal   Collection Time: 10/11/22  6:44 AM  Result Value Ref Range   WBC 5.3 4.0 - 10.5  K/uL   RBC 3.29 (L) 3.87 - 5.11 MIL/uL   Hemoglobin 9.5 (L) 12.0 - 15.0 g/dL   HCT 28.9 (L) 36.0 - 46.0 %   MCV 87.8 80.0 - 100.0 fL   MCH 28.9 26.0 - 34.0 pg   MCHC 32.9 30.0 - 36.0 g/dL   RDW 14.6 11.5 - 15.5 %   Platelets 150 150 - 400 K/uL   nRBC 0.0 0.0 - 0.2 %    Comment: Performed  at [facility], [address] [place], [place] [id number]  Basic metabolic panel     Status: Abnormal   Collection Time: 10/12/22  5:04 AM  Result Value Ref Range   Sodium 131 (L) 135 - 145 mmol/L   Potassium 4.2 3.5 - 5.1 mmol/L   Chloride 109 98 - 111 mmol/L   CO2 18 (L) 22 - 32 mmol/L   Glucose, Bld 89 70 - 99 mg/dL    Comment: Glucose reference range applies only to samples taken after fasting for at least 8 hours.   BUN 15 8 - 23 mg/dL   Creatinine, Ser 1.11 (H) 0.44 - 1.00 mg/dL   Calcium 8.6 (L) 8.9 - 10.3 mg/dL   GFR, Estimated 53 (L) >60 mL/min    Comment: (NOTE) Calculated using the CKD-EPI Creatinine Equation (2021)    Anion gap 4 (L) 5 - 15    Comment: Performed at [facility], [address], [place], [place] [address]  Magnesium     Status: None   Collection Time: 10/12/22  5:04 AM  Result Value Ref Range   Magnesium 1.7 1.7 - 2.4 mg/dL    Comment: Performed at [facility], [address] [place], [place] [address]  Phosphorus     Status: None   Collection Time: 10/12/22  5:04 AM  Result Value Ref Range   Phosphorus 2.8 2.5 - 4.6 mg/dL    Comment: Performed at [facility], [address] [place], [place] [address]    No results found for this or any previous visit (from the past 240 hour(s)).  Lipid Panel Recent Labs    10/11/22 0644  CHOL 89  TRIG 33  HDL 28*  CHOLHDL 3.2  VLDL 7  LDLCALC 54    Studies/Results: MR BRAIN WO CONTRAST  Result Date: 10/10/2022 CLINICAL DATA:  Initial evaluation for neuro deficit, stroke suspected. EXAM: MRI HEAD WITHOUT CONTRAST TECHNIQUE: Multiplanar, multiecho pulse sequences of the  brain and surrounding structures were obtained without intravenous contrast. COMPARISON:  Prior CT from earlier the same day. FINDINGS: Brain: Examination mildly degraded by motion artifact. Mild age-related cerebral atrophy with chronic small vessel ischemic disease. Few small remote lacunar infarcts noted about the thalami. No abnormal foci of restricted diffusion to suggest acute or subacute ischemia. Gray-white matter differentiation maintained. No areas of chronic cortical infarction. No acute intracranial hemorrhage. Single chronic microhemorrhage noted at the left occipital region, of doubtful significance in isolation. No mass lesion, midline shift or mass effect. No hydrocephalus or extra-axial fluid collection. Pituitary gland and suprasellar region within normal limits. Vascular: Major intracranial vascular flow voids are maintained. Skull and upper cervical spine: Craniocervical junction within normal limits. Bone marrow signal intensity diffusely heterogeneous without worrisome osseous lesion. No scalp soft tissue abnormality. Sinuses/Orbits: Globes and orbital soft tissues within normal limits. Paranasal sinuses are largely clear. No significant mastoid effusion. Other: None. IMPRESSION: 1. No acute intracranial abnormality. 2. Mild age-related cerebral atrophy with chronic small vessel ischemic disease, with a few small remote lacunar infarcts about the thalami. Electronically Signed   By: Jeannine Boga M.D.   On: 10/10/2022 22:51   CT HEAD CODE STROKE WO CONTRAST  Result Date: 10/10/2022 CLINICAL DATA:  Code stroke.  Slurred speech and facial droop. EXAM: CT HEAD WITHOUT CONTRAST TECHNIQUE: Contiguous axial images were obtained from the base of the skull through the vertex without intravenous contrast. RADIATION DOSE REDUCTION: This exam was performed according to the departmental dose-optimization program which  includes automated exposure control, adjustment of the mA and/or kV according  to patient size and/or use of iterative reconstruction technique. COMPARISON:  Head CT 10/25/2021 and MRI 08/02/2021 FINDINGS: Brain: There is no evidence of an acute infarct, intracranial hemorrhage, mass, midline shift, or extra-axial fluid collection. A small chronic cortical/subcortical infarct in the right parietal lobe is unchanged. Hypodensities elsewhere in the cerebral white matter bilaterally are also unchanged and nonspecific but compatible with mild chronic small vessel ischemic disease. There is a chronic lacunar infarct in the right thalamus. Mild cerebral atrophy is within normal limits for age. Vascular: Calcified atherosclerosis at the skull base. No hyperdense vessel. Skull: No acute fracture or suspicious osseous lesion. Sinuses/Orbits: Visualized paranasal sinuses and mastoid air cells are clear. Unremarkable orbits. Other: None. ASPECTS [facility] Stroke Program Early CT Score) - Ganglionic level infarction (caudate, lentiform nuclei, internal capsule, insula, M1-M3 cortex): 7 - Supraganglionic infarction (M4-M6 cortex): 3 Total score (0-10 with 10 being normal): 10 IMPRESSION: 1. No evidence of acute intracranial abnormality. ASPECTS of 10. 2. Mild chronic small vessel ischemic disease. These results were communicated to Dr. Cheral Marker at 7:07 pm on 10/10/2022 by text page via the [facility] messaging system. Electronically Signed   By: Logan Bores M.D.   On: 10/10/2022 19:07    Medications: Scheduled:  aspirin EC  81 mg Oral Daily   atorvastatin  40 mg Oral Daily   carvedilol  12.5 mg Oral BID WC   cyanocobalamin  1,000 mcg Oral Daily   empagliflozin  10 mg Oral Daily   enoxaparin (LOVENOX) injection  40 mg Subcutaneous Q24H   feeding supplement  237 mL Oral Q24H   furosemide  20 mg Oral Daily   gabapentin  300 mg Oral BID   lacosamide  150 mg Oral BID   levETIRAcetam  750 mg Oral BID   losartan  100 mg Oral Daily   multivitamin with minerals  1 tablet Oral Daily   nicotine  14 mg  Transdermal Daily   polyethylene glycol  17 g Oral Daily   senna-docusate  1 tablet Oral BID   spironolactone  12.5 mg Oral Daily     Assessment: 72 y.o. female with a PMHx of cancer, CAD, HLD, HTN, thyroid disease and seizures (diagnosed with focal epilepsy with impairment of consciousness; reports compliance with her Vimpat and Keppra stating "I take two seizure pills in the morning and two at night; last seizure approximately 2-3 years ago) who presented to the ED on Friday after being found on the floor by family with slurred speech and diffuse weakness.  - Exam significantly improved today. She is now fully awake, alert and oriented x 4, missing only the day. Speech is fluent without dysarthria. Upper extremities 5/5 bilaterally. Poor effort with BLE, worse on the right which patient states is due to leg pain.  - Patient today is able to relate additional history. She states that she went to the bathroom at home and was feeling fine, but that as she was exiting the bathroom her legs suddenly became weak and gave out on her. There was no arm weakness and the leg weakness was symmetric. She denies having had any seizure activity and also did not faint. She did not hit her head or lose consciousness. After falling, she was unable to get up due to diffuse weakness, so EMS was called. She states that she has fallen before this as well.  - Patient reports she typically ambulates without assistive devices, but has  had a couple falls in the last 6 months. She did ambulate with PT yesterday but required assistance and exhibited some impulsivity.  - Imaging: CT head: No evidence of acute intracranial abnormality. ASPECTS of 10. Mild chronic small vessel ischemic disease. - MRI brain: No acute intracranial abnormality. Mild age-related cerebral atrophy with chronic small vessel ischemic disease, with a few small remote lacunar infarcts about the thalami. - EKG: Sinus or ectopic atrial rhythm; Prolonged PR  interval; Consider left atrial enlargement; LVH with secondary repolarization abnormality; Anterior Q waves, possibly due to LVH - Labs were unrevealing regarding the underlying etiology for her presentation with hypoactive delirium after a fall at home:   - DDx: - Breakthrough unwitnessed seizure with postictal state as well as unwitnessed syncopal episode are now felt to be unlikely based on new history obtained from patient today (see above). However, subclinical seizure activity is still on the DDx. EEG will be on Monday.  - TIA is possible but felt to be unlikely as only her legs became weak at the time of the fall and the weakness was bilateral, without any arm involvement. - Metabolic encephalopathy was a consideration, but is felt to be unlikely as work up is so far is unremarkable.  - No alerts from telemetry team during this admission to suggest an arrhythmia leading to syncope as the etiology for her fall. Again, patient states today that she did not faint.  - Given the above, mechanical fall due to fatigue or multifactorial gait dysfunction is felt to be the most likely etiology for her presentation. She may have also had a mild delirium at home that could have contributed to the fall. Given her impulsivity and changes in level of alertness this admission, she may have an underlying dementia, which in turn would increase susceptibility to the development of delirium from relatively minor insults.     Recommendations: - Continue her home seizure medications, Keppra and Vimpat (ordered). Keppra was at 500 mg TID >> switching to 750 mg BID (same total daily dose, but switched to the standard BID frequency for this medication).  - Seizure precautions - Orthostatics - Syncope work up to include cardiac telemetry and orthostatics. May need an echocardiogram.  - EEG on Monday - Unable to obtain CTA due to iodinated contrast allergy - Miralax has been discontinued per patient request (causes  diarrhea).    LOS: 2 days   @Electronically$  signed: Dr. Kerney Elbe 10/12/2022  9:18 AM

## 2022-10-12 NOTE — Progress Notes (Signed)
Progress Note   Patient: Anne Phillips [id number] DOB: 1951/01/04 DOA: 10/10/2022     2 DOS: the patient was seen and examined on 10/12/2022   Brief hospital course: Anne Phillips is a 72 y.o. female with medical history significant for hypertension, dyslipidemia, coronary artery disease, seizure disorder breast cancer status postmastectomy tobacco use and hypothyroidism, who presented to the ER with 8 onset of fall.  The patient is been feeling generally weak and is not sure how she fell but she did not lose consciousness.  A family member was with her and left her in the kitchen and came later to find her on the floor.  She was having slurred speech then.  No paresthesias or focal muscle weakness.  She denies any head injuries or other injuries.  She thought her legs may give way.  She denies any seizures.  No urinary or stool incontinence or tongue bites. No fever/chills or infectious symptoms.  ED evaluation revealed hyponatremia with sodium 129, bicarb 19, BUN 19, Cr 1.23 (baseline), normal lactic acid.  CBC with mild anemia and no leukocytosis.  She was afebrile with elevated BP 164/76, other vitals normal.   Due to report of slurred speech and diffuse weakness, neurology was consulted and stroke work-up was initiated.  CT head w/o contrast no acute findings, showed chronic small vessel disease.    MRI brain was negative for acute stroke, showed mild atrophy seen in advanced age, remote lacunar infarcts, and chronic small vessel disease.  Patient admitted to the hospital.  Neurology was consulted.  Breakthrough seizure with postictal state versus unwitnessed syncopal episode were felt most likely etiology of her presentation.  Stroke/TIA felt much less likely.    Assessment and Plan: * Transient neurological symptoms Slurred speech and Generalized Weakness Stroke ruled out - MRI negative.  TIA also felt less likely given presentation of diffuse generalized weakness. --Neurology  consulted --Breakthrough seizure vs unwitnessed syncopal episode --Keppra dose increased 500 >> 750 mg BID --Follow up PT, OT, SLP recs --Per neurology, no need for vascular imaging given no acute stroke on MRI brain and vessel imaging done 14 months ago. --Orthostatic vitals --Telemetry monitoring --Continue daily aspirin, Lipitor --No need for permissive HTN --Continue IV fluids --?Echo --Follow pending EEG  Hyponatremia Presented with sodium 129, suspect due to hypovolemia as PO intake reported to be poor at home recently. Na trend: 129 >> (after sepsis fluids) 135 >> 131. --Off IV fluids --Will hold Lasix tomorrow if not improving --Monitor BMP  Seizure disorder (HCC) Breakthrough seizure felt to be high on the differential with presenting symptoms of postictal state. --Neurology consulted --Increased Keppra 500 >> 750 mg BID --Continue Vimpat --EEG is pending, but per Neurology could be done as outpatient if she is otherwise ready to d/c before done  Delirium, acute Likely hospital delirium in elderly patient with ?underlying mild cognitive impairment, unknown if dementia history. Possibly related to nicotine withdrawal - seems doing better since nicotine patch placed. --Delirium precautions --Very low dose IV haldol PRN if persistent agitation and not redirectable --Address bowel, bladder, pain control etc before giving PRN for agitation --Nicotine patch ordered  Chronic kidney disease, stage 3b ([place]) Renal function stable. Monitor BMP. Renally dose meds & avoid nephrotoxins, hypotension  Weakness PT/OT evaluations. SNF recommended. TOC consulted.  Slurred speech Suspected due to post-ictal state vs unwitnessed syncopal episode.  No stroke on MRI.  Generalized weakness with no other focal deficits no consistent with CVA/TIA.   TIA (transient ischemic  attack) Cannot be definitively ruled out, but felt to be less likely etiology, as outlined.     See Transient  Neurologic Symptoms   Chronic diastolic CHF (congestive heart failure) (HCC) Continue her Jardiance, Aldactone and Cozaar  Coronary artery disease Chronic, stable, no chest pain. Continue aspirin Coreg and statin.  Dyslipidemia Continue Lipitor   Hypertension Continue home medications. No need for permissive HTN.        Subjective: Pt was awake sitting up in bed, eating breakfast.  She reports diarrhea from stool softeners, asking for anti-diarrhea medication.  She denies any other complaints including chest pain, SOB, palpitations, dizziness/ lightheadedness, or N/V.  Per staff yesterday afternoon, pt became quite agitated & needed medication for safety to calm her down.   Physical Exam: Vitals:   10/11/22 2021 10/12/22 0017 10/12/22 0412 10/12/22 0809  BP: (!) 156/92 (!) 164/75 (!) 158/80 (!) 149/67  Pulse: 64 71 65 67  Resp: 18 16 16 16  $ Temp: 98 F (36.7 C) 97.6 F (36.4 C) 98 F (36.7 C) 98 F (36.7 C)  TempSrc: Oral Oral Oral   SpO2: 100% 98% 100% 96%  Weight:       General exam: awake sitting up in bed, no acute distress HEENT: atraumatic, moist mucus membranes, hearing grossly normal  Respiratory system: CTAB, no wheezes or rhonchi, normal respiratory effort on room air. Cardiovascular system: normal S1/S2, RRR, no pedal edema.   Gastrointestinal system: soft, NT, ND, no HSM felt, +bowel sounds. Central nervous system: A&O x self place at least. no gross focal neurologic deficits, normal speech Extremities: no edema, normal tone Skin: dry, intact, normal temperature Psychiatry: normal mood, congruent affect  Data Reviewed:  Notable labs --- Na improved 129 >> 135 >> 131.  Bicarb 19 >> 18 (gap 4).    Cr 1.11 from 1.08 stable and better than baseline CKD   Family Communication: None present. Will attempt to call her sister.   Disposition: Status is: Inpatient Remains inpatient appropriate because:  --Ongoing evaluation and electrolyte  abnormalities. --Needs SNF placement, unsafe to return home alone    Planned Discharge Destination: Home health vs SNF (recommended)    Time spent: 38 minutes  Author: Ezekiel Slocumb, DO 10/12/2022 1:32 PM  For on call review [email or web address].
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
## 2022-10-13 DIAGNOSIS — R29818 Other symptoms and signs involving the nervous system: Secondary | ICD-10-CM | POA: Diagnosis not present

## 2022-10-13 DIAGNOSIS — R569 Unspecified convulsions: Secondary | ICD-10-CM

## 2022-10-13 DIAGNOSIS — G40909 Epilepsy, unspecified, not intractable, without status epilepticus: Secondary | ICD-10-CM | POA: Diagnosis not present

## 2022-10-13 DIAGNOSIS — E871 Hypo-osmolality and hyponatremia: Secondary | ICD-10-CM | POA: Diagnosis not present

## 2022-10-13 DIAGNOSIS — R531 Weakness: Secondary | ICD-10-CM | POA: Diagnosis not present

## 2022-10-13 LAB — BASIC METABOLIC PANEL
Anion gap: 6 (ref 5–15)
BUN: 19 mg/dL (ref 8–23)
CO2: 20 mmol/L — ABNORMAL LOW (ref 22–32)
Calcium: 8.6 mg/dL — ABNORMAL LOW (ref 8.9–10.3)
Chloride: 105 mmol/L (ref 98–111)
Creatinine, Ser: 1.14 mg/dL — ABNORMAL HIGH (ref 0.44–1.00)
GFR, Estimated: 52 mL/min — ABNORMAL LOW (ref >60)
Glucose, Bld: 87 mg/dL (ref 70–99)
Potassium: 4 mmol/L (ref 3.5–5.1)
Sodium: 131 mmol/L — ABNORMAL LOW (ref 135–145)

## 2022-10-13 LAB — MAGNESIUM: Magnesium: 1.8 mg/dL (ref 1.7–2.4)

## 2022-10-13 MED ORDER — ENSURE ENLIVE PO LIQD
237.0000 mL | Freq: Two times a day (BID) | ORAL | Status: DC
  Administered 2022-10-14 – 2022-10-17 (×6): 237 mL via ORAL

## 2022-10-13 NOTE — Progress Notes (Addendum)
Progress Note   Patient: Anne Phillips [id number] DOB: 1951/08/25 DOA: 10/10/2022     3 DOS: the patient was seen and examined on 10/13/2022   Brief hospital course: Anne Phillips is a 72 y.o. female with medical history significant for hypertension, dyslipidemia, coronary artery disease, seizure disorder breast cancer status postmastectomy tobacco use and hypothyroidism, who presented to the ER with 8 onset of fall.  The patient is been feeling generally weak and is not sure how she fell but she did not lose consciousness.  A family member was with her and left her in the kitchen and came later to find her on the floor.  She was having slurred speech then.  No paresthesias or focal muscle weakness.  She denies any head injuries or other injuries.  She thought her legs may give way.  She denies any seizures.  No urinary or stool incontinence or tongue bites. No fever/chills or infectious symptoms.  ED evaluation revealed hyponatremia with sodium 129, bicarb 19, BUN 19, Cr 1.23 (baseline), normal lactic acid.  CBC with mild anemia and no leukocytosis.  She was afebrile with elevated BP 164/76, other vitals normal.   Due to report of slurred speech and diffuse weakness, neurology was consulted and stroke work-up was initiated.  CT head w/o contrast no acute findings, showed chronic small vessel disease.    MRI brain was negative for acute stroke, showed mild atrophy seen in advanced age, remote lacunar infarcts, and chronic small vessel disease.  Patient admitted to the hospital.  Neurology was consulted.  Breakthrough seizure with postictal state versus unwitnessed syncopal episode were felt most likely etiology of her presentation.  Stroke/TIA felt much less likely.    Assessment and Plan: * Transient neurological symptoms Slurred speech and Generalized Weakness Stroke ruled out - MRI negative.  TIA also felt less likely given presentation of diffuse generalized weakness. --Neurology  consulted --Breakthrough seizure vs unwitnessed syncopal episode --Keppra 750 mg BID --Follow up PT, OT, SLP recs --Per neurology, no need for vascular imaging given no acute stroke on MRI brain and vessel imaging done 14 months ago. --Orthostatic vitals --Telemetry monitoring --Continue daily aspirin, Lipitor --No need for permissive HTN --Off IV fluids --?Echo --Follow pending EEG  Hyponatremia Presented with sodium 129, suspect due to hypovolemia as PO intake reported to be poor at home recently. Na trend: 129 >> (after sepsis fluids) 135 >> 131. --Off IV fluids --Will hold Lasix tomorrow if not improving --Monitor BMP  Seizure disorder (HCC) Breakthrough seizure felt to be high on the differential with presenting symptoms of postictal state. --Neurology consulted --Continue Keppra 750 mg BID --Continue Vimpat --EEG today - follow results  Delirium, acute Likely hospital delirium in elderly patient with ?underlying mild cognitive impairment, unknown if dementia history. Possibly related to nicotine withdrawal - seems doing better since nicotine patch placed. --Delirium precautions --Very low dose IV haldol PRN if persistent agitation and not redirectable --Address bowel, bladder, pain control etc before giving PRN for agitation --Nicotine patch ordered  Chronic kidney disease, stage 3b ([place]) Renal function stable. Monitor BMP. Renally dose meds & avoid nephrotoxins, hypotension  Weakness PT/OT evaluations. SNF recommended. TOC consulted.  Slurred speech Suspected due to post-ictal state vs unwitnessed syncopal episode.  No stroke on MRI.  Generalized weakness with no other focal deficits no consistent with CVA/TIA.   TIA (transient ischemic attack) Cannot be definitively ruled out, but felt to be less likely etiology, as outlined.     See Transient  Neurologic Symptoms   Chronic diastolic CHF (congestive heart failure) ([place]) Per chart review - Echo Sept 2023  with EF 55-60%, grade II diastolic dysfunction. Continue her Jardiance, Aldactone and Cozaar  Coronary artery disease Chronic, stable, no chest pain. Continue aspirin Coreg and statin.  Dyslipidemia Continue Lipitor   Hypertension Continue home medications. No need for permissive HTN.        Subjective: Pt was sleeping comfortably when seen this AM. No acute events reported.  She woke just briefly, reported feeling fine, no complaints other than she's tired.    Physical Exam: Vitals:   10/13/22 0313 10/13/22 0358 10/13/22 0740 10/13/22 0755  BP: (!) 149/78 129/80 (!) 158/88 (!) 156/84  Pulse: 63 62 65   Resp: 16 20 18   $ Temp: 98.5 F (36.9 C) 97.7 F (36.5 C) 97.6 F (36.4 C)   TempSrc: Oral Oral Axillary   SpO2: 98% 98% 96%   Weight:       General exam: sleeping comfortably, no acute distress HEENT: atraumatic, moist mucus membranes, hearing grossly normal  Respiratory system: CTAB, no wheezes or rhonchi, normal respiratory effort on room air. Cardiovascular system: normal S1/S2, RRR, no pedal edema.   Gastrointestinal system: soft, NT, ND, no HSM felt, +bowel sounds. Central nervous system: exam limited by somnolence, grossly non-focal Extremities: no edema, normal tone Skin: dry, intact, normal temperature Psychiatry: exam limited by somnolence  Data Reviewed:  Notable labs --- Na improved 129 >> 135 >> 131 >>131.  Bicarb 19 >> 18 >> 20.    Cr 1.14 from 1.11 stable and better than baseline CKD   Family Communication: None present. Will attempt to call her sister.   Disposition: Status is: Inpatient Remains inpatient appropriate because:  --Ongoing evaluation (EEG) and electrolyte abnormalities. --Needs SNF placement, unsafe to return home alone    Planned Discharge Destination: Home health vs SNF (recommended)    Time spent: 38 minutes  Author: Ezekiel Slocumb, DO 10/13/2022 11:39 AM  For on call review [email or web address].

## 2022-10-13 NOTE — Plan of Care (Signed)

## 2022-10-13 NOTE — Care Management Important Message (Signed)
Important Message  Patient Details  Name: Anne Phillips MRN: [id number] Date of Birth: 03-08-1951   Medicare Important Message Given:  Yes  Patient out of room upon time of visit.  Copy of Medicare IM left in room on counter for reference.   [name] 10/13/2022, 11:36 AM

## 2022-10-13 NOTE — Progress Notes (Signed)
Nutrition Follow-up  DOCUMENTATION CODES:   Not applicable  INTERVENTION:   -Downgrade diet to dysphagia 3, for ease of intake -Ensure Enlive po BID, each supplement provides 350 kcal and 20 grams of protein -MVI with minerals daily  NUTRITION DIAGNOSIS:   Increased nutrient needs related to acute illness as evidenced by estimated needs.  Ongoing  GOAL:   Patient will meet greater than or equal to 90% of their needs  Progressing   MONITOR:   PO intake, Supplement acceptance, Labs, Weight trends, I & O's  REASON FOR ASSESSMENT:   Malnutrition Screening Tool    ASSESSMENT:   72 y.o. female with medical history of HTN, dyslipidemia, CAD, seizure disorder, breast cancer s/p mastectomy, tobacco use, and hypothyroidism. She presented to the ED after a fall and slurred speech noted by a family member. She has been experiencing generalized weakness.  Reviewed I/O's: +20 ml x 24 hours and +620 ml since admission  UOP: 100 ml x 24 hours   Per neurology notes, stroke has been ruled out.   Spoke with pt at bedside, who kept her eyes closed for majority of visit. Pt's largest concern is that she has had diarrhea x 3 today. She denies having diarrhea prior to today.   Pt reports fair appetite. PTA she consumes 1-2 meals per day, which are prepared by her sister. Pt does not like the food served to her PTA as it is "too salty". Noted meal completions 0%. Pt with multiple missing teeth.   No wt loss noted over the past year. Pt denies any weight loss, stating "I've been gaining weight".   Discussed importance of good meal and supplement intake to promote healing. Pt amenable to supplements.   Medications reviewed and include vitamin B-12, lasix, keppra, senokot, and aldactone.   Labs reviewed: Na: 131, CBGS: 96.   NUTRITION - FOCUSED PHYSICAL EXAM:  Flowsheet Row Most Recent Value  Orbital Region No depletion  Upper Arm Region No depletion  Thoracic and Lumbar Region No  depletion  Buccal Region No depletion  Temple Region Mild depletion  Clavicle Bone Region Mild depletion  Clavicle and Acromion Bone Region No depletion  Scapular Bone Region No depletion  Dorsal Hand No depletion  Patellar Region No depletion  Anterior Thigh Region No depletion  Posterior Calf Region No depletion  Edema (RD Assessment) None  Hair Reviewed  Eyes Reviewed  Mouth Reviewed  Skin Reviewed  Nails Reviewed       Diet Order:   Diet Order             Diet Heart Room service appropriate? Yes; Fluid consistency: Thin  Diet effective now                   EDUCATION NEEDS:   Education needs have been addressed  Skin:  Skin Assessment: Reviewed RN Assessment  Last BM:  10/13/19 (type 7)  Height:   Ht Readings from Last 1 Encounters:  10/11/22 5' 1"$  (1.549 m)    Weight:   Wt Readings from Last 1 Encounters:  10/11/22 60.4 kg    Ideal Body Weight:  47.7 kg  BMI:  Body mass index is 25.16 kg/m.  Estimated Nutritional Needs:   Kcal:  1600-1800  Protein:  80-95 grams  Fluid:  > 1.6 L    Loistine Chance, RD, LDN, Rew Registered Dietitian II Certified Diabetes Care and Education Specialist Please refer to [facility] for RD and/or RD on-call/weekend/after hours pager

## 2022-10-13 NOTE — Plan of Care (Signed)
Neurology plan of care  Please see neurology progress note from yesterday for full findings and recommendations. rEEG showed diffuse slowing without epileptiform abnormality. Given patient's clinical improvement, no indication for further inpatient neurologic workup at this time. Continue current AED regimen. Patient may f/u with her established outpatient neurologist at [facility]. Neurology to sign off, but please re-engage if additional neurologic concerns arise.  Su Monks, MD [facility] [phone]  If 7pm- 7am, please page neurology on call as listed in [place].

## 2022-10-13 NOTE — Progress Notes (Signed)
Eeg done 

## 2022-10-13 NOTE — Procedures (Signed)
Routine EEG Report  Anne Phillips is a 72 y.o. female with a history of seizure who is undergoing an EEG to evaluate for seizures.  Report: This EEG was acquired with electrodes placed according to the International 10-20 electrode system (including Fp1, Fp2, F3, F4, C3, C4, P3, P4, O1, O2, T3, T4, T5, T6, A1, A2, Fz, Cz, Pz). The following electrodes were missing or displaced: none.  The occipital dominant rhythm was primarily composed of activity around 6-8 Hz although at times frequency would reach 8.5 Hz. This activity is reactive to stimulation. Drowsiness was manifested by background fragmentation; deeper stages of sleep were identified by K complexes and sleep spindles. There was no focal slowing. There were no interictal epileptiform discharges. There were no electrographic seizures identified. Photic stimulation and hyperventilation were not performed.  Impression and clinical correlation: This EEG was obtained while awake and asleep and is abnormal due to mild diffuse slowing indicative of global cerebral dysfunction. Epileptiform abnormalities were not seen during this recording.  Su Monks, MD [facility] [phone]  If 7pm- 7am, please page neurology on call as listed in [place].

## 2022-10-14 ENCOUNTER — Encounter: Payer: Self-pay | Admitting: Family Medicine

## 2022-10-14 DIAGNOSIS — R29818 Other symptoms and signs involving the nervous system: Secondary | ICD-10-CM | POA: Diagnosis not present

## 2022-10-14 LAB — BASIC METABOLIC PANEL
Anion gap: 9 (ref 5–15)
BUN: 19 mg/dL (ref 8–23)
CO2: 18 mmol/L — ABNORMAL LOW (ref 22–32)
Calcium: 9.1 mg/dL (ref 8.9–10.3)
Chloride: 104 mmol/L (ref 98–111)
Creatinine, Ser: 1.18 mg/dL — ABNORMAL HIGH (ref 0.44–1.00)
GFR, Estimated: 50 mL/min — ABNORMAL LOW (ref >60)
Glucose, Bld: 77 mg/dL (ref 70–99)
Potassium: 4.5 mmol/L (ref 3.5–5.1)
Sodium: 131 mmol/L — ABNORMAL LOW (ref 135–145)

## 2022-10-14 NOTE — TOC Progression Note (Signed)
Transition of Care [facility]) - Progression Note    Patient Details  Name: Anne Phillips MRN: [id number] Date of Birth: 1950-11-08  [facility]) CM/SW Contact  Gerilyn Pilgrim, LCSW Phone Number: 10/14/2022, 3:26 PM  Clinical Narrative:   Josem Kaufmann started for [facility].     Expected Discharge Plan: [place] Barriers to Discharge: Continued Medical Work up  Expected Discharge Plan and Services     Post Acute Care Choice: [place] Living arrangements for the past 2 months: Single Family Home                                       Social Determinants of Health (SDOH) Interventions SDOH Screenings   Food Insecurity: No Food Insecurity (10/11/2022)  Housing: Low Risk  (10/11/2022)  Transportation Needs: No Transportation Needs (10/11/2022)  Utilities: Not At Risk (10/11/2022)  Tobacco Use: High Risk (10/10/2022)    Readmission Risk Interventions    10/12/2022   11:51 AM  Readmission Risk Prevention Plan  PCP or Specialist Appt within 3-5 Days Complete  Social Work Consult for [place] Planning/Counseling Complete  Palliative Care Screening Not Applicable

## 2022-10-14 NOTE — Progress Notes (Signed)
PT Cancellation Note  Patient Details Name: Anne Phillips MRN: [id number] DOB: Aug 24, 1951   Cancelled Treatment:    Reason Eval/Treat Not Completed: Other (comment).  Pt sleeping in bed upon PT arrival; pt woken with vc's but would not open her eyes; attempted to encourage pt to participate in therapy but pt refusing; attempted to have pt order lunch but pt declined d/t not being hungry; attempted to encourage pt to sit in chair to visit with her sister who had just arrived but pt declined.  Will re-attempt PT session at a later date/time.  Leitha Bleak, PT 10/14/22, 12:08 PM

## 2022-10-14 NOTE — Progress Notes (Signed)
Physical Therapy Treatment Patient Details Name: Anne Phillips MRN: [id number] DOB: 01-Oct-1950 Today's Date: 10/14/2022   History of Present Illness Per chart: Anne Phillips is a 72 y.o. female with medical history significant for hypertension, dyslipidemia, coronary artery disease, seizure disorder breast cancer status postmastectomy tobacco use and hypothyroidism, who presented to the ER with 8 onset of fall.  The patient is been feeling generally weak and is not sure how she fell but she did not lose consciousness.  A family member was with her and left her in the kitchen and came later to find her on the floor.  She was having slurred speech then.  No paresthesias or focal muscle weakness.  She denies any head injuries or other injuries.  She thought her legs may give way.  She denies any seizures.    PT Comments    Pt sleeping in bed upon PT arrival; woken with vc's and agreeable to therapy.  During session pt SBA with bed mobility; CGA with transfers; and CGA to occasional min assist to ambulate 100 feet with RW use.  Pt appearing impulsive intermittently during session requiring vc's and assist for safety; pt requiring vc's to stay closer to RW during ambulation and also to keep using RW at times--pt unsteady without RW use; pt with a few loss of balance during standing activities requiring min assist to steady.  Will continue to focus on strengthening, balance, and progressive functional mobility during hospitalization.    Recommendations for follow up therapy are one component of a multi-disciplinary discharge planning process, led by the attending physician.  Recommendations may be updated based on patient status, additional functional criteria and insurance authorization.  Follow Up Recommendations  Skilled nursing-short term rehab (<3 hours/day)     Assistance Recommended at Discharge Frequent or constant Supervision/Assistance  Patient can return home with the following A little  help with walking and/or transfers;A little help with bathing/dressing/bathroom;Assistance with cooking/housework;Assist for transportation;Help with stairs or ramp for entrance   Equipment Recommendations  Rolling walker (2 wheels)    Recommendations for Other Services       Precautions / Restrictions Precautions Precautions: Fall Restrictions Weight Bearing Restrictions: No     Mobility  Bed Mobility Overal bed mobility: Needs Assistance Bed Mobility: Supine to Sit, Sit to Supine     Supine to sit: Supervision Sit to supine: Supervision   General bed mobility comments: mild increased time/effort to perform on own    Transfers Overall transfer level: Needs assistance Equipment used: Rolling walker (2 wheels) Transfers: Sit to/from Stand, Bed to chair/wheelchair/BSC Sit to Stand: Min guard   Step pivot transfers: Min guard       General transfer comment: vc's for safety and walker use    Ambulation/Gait Ambulation/Gait assistance: Min guard, Min assist Gait Distance (Feet): 100 Feet Assistive device: Rolling walker (2 wheels)         General Gait Details: vc's to stay closer to walker and for safety   Stairs             Wheelchair Mobility    Modified Rankin (Stroke Patients Only)       Balance Overall balance assessment: Needs assistance Sitting-balance support: No upper extremity supported, Feet supported Sitting balance-Leahy Scale: Good Sitting balance - Comments: steady sitting reaching within BOS   Standing balance support: No upper extremity supported Standing balance-Leahy Scale: Poor Standing balance comment: pt with loss of balance when standing and looking through belongings bag for item requiring min  assist for balance                            Cognition Arousal/Alertness: Awake/alert Behavior During Therapy: Impulsive Overall Cognitive Status: Within Functional Limits for tasks assessed                                  General Comments: Impulsiveness noted at times.        Exercises      General Comments        Pertinent Vitals/Pain Pain Assessment Pain Assessment: No/denies pain Pain Intervention(s): Limited activity within patient's tolerance, Monitored during session, Repositioned    Home Living                          Prior Function            PT Goals (current goals can now be found in the care plan section) Acute Rehab PT Goals Patient Stated Goal: improve her mobility. PT Goal Formulation: With patient Time For Goal Achievement: 10/25/22 Potential to Achieve Goals: Good Progress towards PT goals: Progressing toward goals    Frequency    Min 2X/week      PT Plan Current plan remains appropriate    Co-evaluation              AM-PAC PT "6 Clicks" Mobility   Outcome Measure  Help needed turning from your back to your side while in a flat bed without using bedrails?: A Little Help needed moving from lying on your back to sitting on the side of a flat bed without using bedrails?: A Little Help needed moving to and from a bed to a chair (including a wheelchair)?: A Little Help needed standing up from a chair using your arms (e.g., wheelchair or bedside chair)?: A Little Help needed to walk in hospital room?: A Little Help needed climbing 3-5 steps with a railing? : A Little 6 Click Score: 18    End of Session Equipment Utilized During Treatment: Gait belt Activity Tolerance: Patient limited by fatigue Patient left: in bed;with call bell/phone within reach;with bed alarm set Nurse Communication: Mobility status;Precautions PT Visit Diagnosis: Difficulty in walking, not elsewhere classified (R26.2);Unsteadiness on feet (R26.81);Muscle weakness (generalized) (M62.81)     Time: [id number] PT Time Calculation (min) (ACUTE ONLY): 16 min  Charges:  $Therapeutic Activity: 8-22 mins                     Leitha Bleak, PT 10/14/22,  4:57 PM

## 2022-10-14 NOTE — Plan of Care (Signed)
  Problem: Education: Goal: Knowledge of disease or condition will improve Outcome: Progressing   

## 2022-10-14 NOTE — Progress Notes (Signed)
Progress Note   Patient: Anne Phillips [id number] DOB: 1951/08/26 DOA: 10/10/2022     4 DOS: the patient was seen and examined on 10/14/2022   Brief hospital course: SHAMELL FERRARE is a 72 y.o. female with medical history significant for hypertension, dyslipidemia, coronary artery disease, seizure disorder breast cancer status postmastectomy tobacco use and hypothyroidism, who presented to the ER with 8 onset of fall.  The patient is been feeling generally weak and is not sure how she fell but she did not lose consciousness.  A family member was with her and left her in the kitchen and came later to find her on the floor.  She was having slurred speech then.  No paresthesias or focal muscle weakness.  She denies any head injuries or other injuries.  She thought her legs may give way.  She denies any seizures.  No urinary or stool incontinence or tongue bites. No fever/chills or infectious symptoms.  ED evaluation revealed hyponatremia with sodium 129, bicarb 19, BUN 19, Cr 1.23 (baseline), normal lactic acid.  CBC with mild anemia and no leukocytosis.  She was afebrile with elevated BP 164/76, other vitals normal.   Due to report of slurred speech and diffuse weakness, neurology was consulted and stroke work-up was initiated.  CT head w/o contrast no acute findings, showed chronic small vessel disease.    MRI brain was negative for acute stroke, showed mild atrophy seen in advanced age, remote lacunar infarcts, and chronic small vessel disease.  Patient admitted to the hospital.  Neurology was consulted.  Breakthrough seizure with postictal state versus unwitnessed syncopal episode were felt most likely etiology of her presentation.  Stroke/TIA felt much less likely.    Assessment and Plan: * Transient neurological symptoms Slurred speech and Generalized Weakness Stroke ruled out - MRI negative.  TIA also felt less likely given presentation of diffuse generalized weakness. --Neurology  consulted --Breakthrough seizure vs unwitnessed syncopal episode --Keppra 750 mg BID --Seen by PT, OT, SLP / SNF recommended at d/c --Per neurology, no need for vascular imaging given no acute stroke on MRI brain and vessel imaging done 14 months ago. --Orthostatic vitals --Telemetry monitoring --Continue daily aspirin, Lipitor --No need for permissive HTN --EEG negative for seizure activity, showed diffuse slowing  Hyponatremia Presented with sodium 129, suspect due to hypovolemia as PO intake reported to be poor at home recently. Na trend: 129 >> (after sepsis fluids) 135 >> 131. --Off IV fluids --Encourage PO intake & hydration --Hold Lasix, suspect mild hypovolemia --Monitor BMP  Seizure disorder (HCC) Breakthrough seizure felt to be high on the differential with presenting symptoms of postictal state. --Neurology consulted --Continue Keppra 750 mg BID --Continue Vimpat --EEG 2/12 showed diffuse slowing without epileptiform abnormality  --Follow up with outpatient neurologist  Delirium, acute Likely hospital delirium in elderly patient with ?underlying mild cognitive impairment, unknown if dementia history. Possibly related to nicotine withdrawal - seems doing better since nicotine patch placed. --Delirium precautions --Very low dose IV haldol PRN if persistent agitation and not redirectable --Address bowel, bladder, pain control etc before giving PRN for agitation --Nicotine patch ordered  Chronic kidney disease, stage 3b ([place]) Renal function stable. Monitor BMP. Renally dose meds & avoid nephrotoxins, hypotension  Weakness PT/OT evaluations. SNF recommended. TOC consulted.  TIA (transient ischemic attack) Cannot be definitively ruled out, but felt to be less likely etiology, as outlined.     See Transient Neurologic Symptoms   Chronic diastolic CHF (congestive heart failure) ([place]) Per chart  review - Echo Sept 2023 with EF 55-60%, grade II diastolic  dysfunction. Continue her Jardiance, Aldactone and Cozaar Hold Lasix & monitor for Na improvement Daily weights  Coronary artery disease Chronic, stable, no chest pain. Continue aspirin Coreg and statin.  Dyslipidemia Continue Lipitor   Hypertension Continue home medications. No need for permissive HTN.  Slurred speech-resolved as of 10/14/2022 Suspected due to post-ictal state vs unwitnessed syncopal episode.  No stroke on MRI.  Generalized weakness with no other focal deficits no consistent with CVA/TIA.         Subjective: Pt was sleeping comfortably when seen this AM. She woke briefly, reported she is tired today. Otherwise no acute complaints or acute issues reported.   Physical Exam: Vitals:   10/14/22 0720 10/14/22 0736 10/14/22 1112 10/14/22 1530  BP: (!) 145/84 (!) 156/79 106/63 138/76  Pulse: 88  69 73  Resp: 18  17 18  $ Temp: 99.1 F (37.3 C)  98.8 F (37.1 C) 97.6 F (36.4 C)  TempSrc: Oral  Oral   SpO2: 100%  97% 99%  Weight:      Height:       General exam: sleeping comfortably, no acute distress Respiratory system: lungs clear, normal respiratory effort on room air. Cardiovascular system: normal S1/S2, RRR, no pedal edema.   Gastrointestinal system: soft, NT, ND, no HSM felt, +bowel sounds. Central nervous system: exam limited by somnolence, grossly non-focal Extremities: no edema, normal tone Skin: dry, intact, normal temperature Psychiatry: exam limited by somnolence  Data Reviewed:  Notable labs --- Na stable at 131.  Bicarb 18 (range 18-20).    Cr 1.18 from 1.14 stable and better than baseline CKD   Family Communication: None present. Attempted to call her sister but was unable to reach her.   Disposition: Status is: Inpatient Remains inpatient appropriate because:  --Needs SNF placement, unsafe to return home alone Medically stable    Planned Discharge Destination: SNF    Time spent: 25 minutes  Author: Ezekiel Slocumb,  DO 10/14/2022 4:05 PM  For on call review [email or web address].

## 2022-10-14 NOTE — Progress Notes (Signed)
Occupational Therapy Treatment Patient Details Name: LEILEEN PANDE MRN: [id number] DOB: [birth year] Today's Date: 10/14/2022   History of present illness Per chart: Anne Phillips is a 72 y.o. female with medical history significant for hypertension, dyslipidemia, coronary artery disease, seizure disorder breast cancer status postmastectomy tobacco use and hypothyroidism, who presented to the ER with 8 onset of fall.  The patient is been feeling generally weak and is not sure how she fell but she did not lose consciousness.  A family member was with her and left her in the kitchen and came later to find her on the floor.  She was having slurred speech then.  No paresthesias or focal muscle weakness.  She denies any head injuries or other injuries.  She thought her legs may give way.  She denies any seizures.   OT comments  Upon entering the room, pt supine in bed and sleeping soundly in bed but agreeable to OT intervention. Pt performs supine >sit with min guard and sitting EOB without assistance. Pt stands with min guard and ambulates ~ 30' to bathroom with RW and min guard with cues for upright position and forward gaze. Pt needing min guard for balance while performing hygiene and clothing management. Pt then returning to bed in same manner as above. She fatigues very quickly and returns to bed with bed alarm activated and call bell within reach. Pt continues to benefit from OT intervention.    Recommendations for follow up therapy are one component of a multi-disciplinary discharge planning process, led by the attending physician.  Recommendations may be updated based on patient status, additional functional criteria and insurance authorization.    Follow Up Recommendations  Skilled nursing-short term rehab (<3 hours/day)     Assistance Recommended at Discharge Frequent or constant Supervision/Assistance  Patient can return home with the following  Assist for transportation;A little help  with bathing/dressing/bathroom;A little help with walking and/or transfers   Equipment Recommendations  Other (comment) (defer to next venue of care)       Precautions / Restrictions Precautions Precautions: Fall Restrictions Weight Bearing Restrictions: No       Mobility Bed Mobility Overal bed mobility: Needs Assistance Bed Mobility: Supine to Sit, Sit to Supine     Supine to sit: Supervision Sit to supine: Supervision   General bed mobility comments: increased time and cues for technique    Transfers Overall transfer level: Needs assistance Equipment used: Rolling walker (2 wheels) Transfers: Sit to/from Stand Sit to Stand: Min guard                 Balance Overall balance assessment: Needs assistance, Mild deficits observed, not formally tested Sitting-balance support: Bilateral upper extremity supported Sitting balance-Leahy Scale: Fair     Standing balance support: Bilateral upper extremity supported, Reliant on assistive device for balance, During functional activity Standing balance-Leahy Scale: Fair                             ADL either performed or assessed with clinical judgement   ADL Overall ADL's : Needs assistance/impaired                         Toilet Transfer: Min guard;Rolling walker (2 wheels);Ambulation   Toileting- Clothing Manipulation and Hygiene: Min guard;Sit to/from stand       Functional mobility during ADLs: Min guard;Rolling walker (2 wheels)      Extremity/Trunk  Assessment Upper Extremity Assessment Upper Extremity Assessment: Generalized weakness   Lower Extremity Assessment Lower Extremity Assessment: Generalized weakness   Cervical / Trunk Assessment Cervical / Trunk Assessment: Kyphotic    Vision Baseline Vision/History: 1 Wears glasses Patient Visual Report: No change from baseline            Cognition Arousal/Alertness: Awake/alert Behavior During Therapy: WFL for tasks  assessed/performed Overall Cognitive Status: Within Functional Limits for tasks assessed                                 General Comments: Pt is pleasant and cooperative during session and follows 1 step commands with increased time                   Pertinent Vitals/ Pain       Pain Assessment Pain Assessment: No/denies pain Pain Score: 0-No pain         Frequency  Min 2X/week        Progress Toward Goals  OT Goals(current goals can now be found in the care plan section)  Progress towards OT goals: Progressing toward goals  Acute Rehab OT Goals Patient Stated Goal: Pt wants to return home OT Goal Formulation: With patient Time For Goal Achievement: 10/25/22 Potential to Achieve Goals: Good  Plan Discharge plan remains appropriate;Frequency remains appropriate       AM-PAC OT "6 Clicks" Daily Activity     Outcome Measure   Help from another person eating meals?: None Help from another person taking care of personal grooming?: None Help from another person toileting, which includes using toliet, bedpan, or urinal?: A Little Help from another person bathing (including washing, rinsing, drying)?: A Little Help from another person to put on and taking off regular upper body clothing?: None Help from another person to put on and taking off regular lower body clothing?: A Little 6 Click Score: 21    End of Session Equipment Utilized During Treatment: Rolling walker (2 wheels)  OT Visit Diagnosis: Muscle weakness (generalized) (M62.81);History of falling (Z91.81)   Activity Tolerance Patient tolerated treatment well   Patient Left in bed;with bed alarm set;with call bell/phone within reach   Nurse Communication Mobility status        Time: [id number] OT Time Calculation (min): 19 min  Charges: OT General Charges $OT Visit: 1 Visit OT Treatments $Self Care/Home Management : 8-22 mins  Darleen Crocker, MS, OTR/L , CBIS ascom [phone]   10/14/22, 3:52 PM

## 2022-10-15 ENCOUNTER — Inpatient Hospital Stay: Payer: Medicare HMO

## 2022-10-15 DIAGNOSIS — G40919 Epilepsy, unspecified, intractable, without status epilepticus: Secondary | ICD-10-CM

## 2022-10-15 DIAGNOSIS — R451 Restlessness and agitation: Secondary | ICD-10-CM | POA: Diagnosis not present

## 2022-10-15 DIAGNOSIS — G40909 Epilepsy, unspecified, not intractable, without status epilepticus: Secondary | ICD-10-CM | POA: Diagnosis not present

## 2022-10-15 DIAGNOSIS — R410 Disorientation, unspecified: Secondary | ICD-10-CM | POA: Diagnosis not present

## 2022-10-15 DIAGNOSIS — R4182 Altered mental status, unspecified: Secondary | ICD-10-CM

## 2022-10-15 DIAGNOSIS — I5032 Chronic diastolic (congestive) heart failure: Secondary | ICD-10-CM | POA: Diagnosis not present

## 2022-10-15 DIAGNOSIS — R29818 Other symptoms and signs involving the nervous system: Secondary | ICD-10-CM | POA: Diagnosis not present

## 2022-10-15 DIAGNOSIS — R569 Unspecified convulsions: Secondary | ICD-10-CM | POA: Diagnosis not present

## 2022-10-15 DIAGNOSIS — E871 Hypo-osmolality and hyponatremia: Secondary | ICD-10-CM | POA: Diagnosis not present

## 2022-10-15 DIAGNOSIS — R5383 Other fatigue: Secondary | ICD-10-CM

## 2022-10-15 LAB — BASIC METABOLIC PANEL
Anion gap: 7 (ref 5–15)
BUN: 20 mg/dL (ref 8–23)
CO2: 19 mmol/L — ABNORMAL LOW (ref 22–32)
Calcium: 9 mg/dL (ref 8.9–10.3)
Chloride: 105 mmol/L (ref 98–111)
Creatinine, Ser: 1.16 mg/dL — ABNORMAL HIGH (ref 0.44–1.00)
GFR, Estimated: 51 mL/min — ABNORMAL LOW (ref >60)
Glucose, Bld: 90 mg/dL (ref 70–99)
Potassium: 4.2 mmol/L (ref 3.5–5.1)
Sodium: 131 mmol/L — ABNORMAL LOW (ref 135–145)

## 2022-10-15 LAB — URINALYSIS, COMPLETE (UACMP) WITH MICROSCOPIC
Bilirubin Urine: NEGATIVE
Glucose, UA: 150 mg/dL — AB
Ketones, ur: NEGATIVE mg/dL
Nitrite: NEGATIVE
Protein, ur: 30 mg/dL — AB
Specific Gravity, Urine: 1.024 (ref 1.005–1.030)
pH: 5 (ref 5.0–8.0)

## 2022-10-15 LAB — CBC
HCT: 28.5 % — ABNORMAL LOW (ref 36.0–46.0)
Hemoglobin: 9.6 g/dL — ABNORMAL LOW (ref 12.0–15.0)
MCH: 28.8 pg (ref 26.0–34.0)
MCHC: 33.7 g/dL (ref 30.0–36.0)
MCV: 85.6 fL (ref 80.0–100.0)
Platelets: 172 10*3/uL (ref 150–400)
RBC: 3.33 MIL/uL — ABNORMAL LOW (ref 3.87–5.11)
RDW: 14.4 % (ref 11.5–15.5)
WBC: 5.4 10*3/uL (ref 4.0–10.5)
nRBC: 0 % (ref 0.0–0.2)

## 2022-10-15 LAB — TSH: TSH: 0.44 u[IU]/mL (ref 0.350–4.500)

## 2022-10-15 MED ORDER — LACOSAMIDE 50 MG PO TABS
50.0000 mg | ORAL_TABLET | Freq: Once | ORAL | Status: AC
  Administered 2022-10-15: 50 mg via ORAL
  Filled 2022-10-15: qty 1

## 2022-10-15 MED ORDER — LACOSAMIDE 50 MG PO TABS
200.0000 mg | ORAL_TABLET | Freq: Two times a day (BID) | ORAL | Status: DC
  Administered 2022-10-15 – 2022-10-16 (×2): 200 mg via ORAL
  Filled 2022-10-15 (×2): qty 4

## 2022-10-15 MED ORDER — LOPERAMIDE HCL 2 MG PO CAPS
4.0000 mg | ORAL_CAPSULE | ORAL | Status: DC | PRN
  Administered 2022-10-15 – 2022-10-17 (×2): 4 mg via ORAL
  Filled 2022-10-15 (×2): qty 2

## 2022-10-15 MED ORDER — CEPHALEXIN 500 MG PO CAPS
500.0000 mg | ORAL_CAPSULE | Freq: Four times a day (QID) | ORAL | Status: DC
  Administered 2022-10-15 – 2022-10-17 (×8): 500 mg via ORAL
  Filled 2022-10-15 (×8): qty 1

## 2022-10-15 NOTE — Progress Notes (Signed)
Physical Therapy Treatment Patient Details Name: Anne Phillips MRN: [id number] DOB: 09/26/1950 Today's Date: 10/15/2022   History of Present Illness Per chart: Anne Phillips is a 72 y.o. female with medical history significant for hypertension, dyslipidemia, coronary artery disease, seizure disorder breast cancer status postmastectomy tobacco use and hypothyroidism, who presented to the ER with 8 onset of fall.  The patient is been feeling generally weak and is not sure how she fell but she did not lose consciousness.  A family member was with her and left her in the kitchen and came later to find her on the floor.  She was having slurred speech then.  No paresthesias or focal muscle weakness.  She denies any head injuries or other injuries.  She thought her legs may give way.  She denies any seizures.    PT Comments    Pt seen for PT tx. Pt received in bed asleep, nurse present in room. Pt requires MAX multimodal cuing & encouragement to wake up & initiate OOB mobility. Pt is able to complete bed mobility with as little as CGA when willing. Pt requires MAX encouragement to walk to recliner, with pt initially using RW for few steps then pushing it to the side. Pt ultimately requires BUE HHA & max encouragement to continue to recliner vs returning to bed. Pt unable to report correct age nor location at this time, requires MAX encouragement to increase alertness even at end of session. Continue to recommend STR upon d/c.    Recommendations for follow up therapy are one component of a multi-disciplinary discharge planning process, led by the attending physician.  Recommendations may be updated based on patient status, additional functional criteria and insurance authorization.  Follow Up Recommendations  Skilled nursing-short term rehab (<3 hours/day) Can patient physically be transported by private vehicle: No   Assistance Recommended at Discharge Frequent or constant Supervision/Assistance   Patient can return home with the following A little help with walking and/or transfers;A little help with bathing/dressing/bathroom;Assistance with cooking/housework;Assist for transportation;Help with stairs or ramp for entrance   Equipment Recommendations  Rolling walker (2 wheels)    Recommendations for Other Services       Precautions / Restrictions Precautions Precautions: Fall Restrictions Weight Bearing Restrictions: No     Mobility  Bed Mobility   Bed Mobility: Sidelying to Sit, Sit to Sidelying   Sidelying to sit: Min guard, Supervision Supine to sit: Supervision     General bed mobility comments: MAX cuing to initiate coming to sitting EOB    Transfers Overall transfer level: Needs assistance Equipment used: Rolling walker (2 wheels) Transfers: Sit to/from Stand Sit to Stand: Min guard           General transfer comment: Pt initially STS with RW with CGA, but then lets go of AD during gait.    Ambulation/Gait Ambulation/Gait assistance: Min assist Gait Distance (Feet): 7 Feet Assistive device: Rolling walker (2 wheels), 1 person hand held assist Gait Pattern/deviations: Decreased step length - right, Decreased step length - left, Decreased stride length, Decreased dorsiflexion - left, Decreased dorsiflexion - right Gait velocity: decreased     General Gait Details: Pt initially takes a few steps with BUE support on RW, then pushes it to the side & attempts to hold to bed/furniture in room, pt ultimately given BUE HHA & ambulates around bed to recliner. Requires cuing to locate recliner in room.   Stairs  Wheelchair Mobility    Modified Rankin (Stroke Patients Only)       Balance Overall balance assessment: Needs assistance Sitting-balance support: Feet supported, No upper extremity supported Sitting balance-Leahy Scale: Good     Standing balance support: During functional activity, Single extremity supported Standing  balance-Leahy Scale: Poor                              Cognition Arousal/Alertness: Lethargic                                     General Comments: Pt does not report correct age, unable to state current location. Pt very lethargic, requiring MAX multimodal cuing for participation. Pt strongly requesting to go back to sleep. PT & nurse encouraged pt to get OOB & try to eat breakfast.        Exercises      General Comments        Pertinent Vitals/Pain Pain Assessment Pain Assessment: Faces Faces Pain Scale: No hurt    Home Living                          Prior Function            PT Goals (current goals can now be found in the care plan section) Acute Rehab PT Goals Patient Stated Goal: improve her mobility. PT Goal Formulation: With patient Time For Goal Achievement: 10/25/22 Potential to Achieve Goals: Fair Progress towards PT goals: Progressing toward goals    Frequency    Min 2X/week      PT Plan Current plan remains appropriate    Co-evaluation              AM-PAC PT "6 Clicks" Mobility   Outcome Measure  Help needed turning from your back to your side while in a flat bed without using bedrails?: A Little Help needed moving from lying on your back to sitting on the side of a flat bed without using bedrails?: A Little Help needed moving to and from a bed to a chair (including a wheelchair)?: A Little Help needed standing up from a chair using your arms (e.g., wheelchair or bedside chair)?: A Little Help needed to walk in hospital room?: A Little Help needed climbing 3-5 steps with a railing? : A Little 6 Click Score: 18    End of Session   Activity Tolerance: Patient limited by fatigue Patient left: in chair;with call bell/phone within reach;with chair alarm set;with nursing/sitter in room   PT Visit Diagnosis: Difficulty in walking, not elsewhere classified (R26.2);Unsteadiness on feet (R26.81);Muscle  weakness (generalized) (M62.81)     Time: [id number] PT Time Calculation (min) (ACUTE ONLY): 10 min  Charges:  $Therapeutic Activity: 8-22 mins                     Anne Phillips, PT, DPT 10/15/22, 9:45 AM   Anne Phillips 10/15/2022, 9:44 AM

## 2022-10-15 NOTE — Progress Notes (Signed)
Progress Note   Patient: Anne Phillips [id number] DOB: 04-30-1951 DOA: 10/10/2022     5 DOS: the patient was seen and examined on 10/15/2022   Brief hospital course: Anne Phillips is a 72 y.o. female with medical history significant for hypertension, dyslipidemia, coronary artery disease, seizure disorder breast cancer status postmastectomy tobacco use and hypothyroidism, who presented to the ER with 8 onset of fall.  The patient is been feeling generally weak and is not sure how she fell but she did not lose consciousness.  A family member was with her and left her in the kitchen and came later to find her on the floor.  She was having slurred speech then.  No paresthesias or focal muscle weakness.  She denies any head injuries or other injuries.  She thought her legs may give way.  She denies any seizures.  No urinary or stool incontinence or tongue bites. No fever/chills or infectious symptoms.  ED evaluation revealed hyponatremia with sodium 129, bicarb 19, BUN 19, Cr 1.23 (baseline), normal lactic acid.  CBC with mild anemia and no leukocytosis.  She was afebrile with elevated BP 164/76, other vitals normal.   Due to report of slurred speech and diffuse weakness, neurology was consulted and stroke work-up was initiated.  CT head w/o contrast no acute findings, showed chronic small vessel disease.    MRI brain was negative for acute stroke, showed mild atrophy seen in advanced age, remote lacunar infarcts, and chronic small vessel disease.  Patient admitted to the hospital.  Neurology was consulted.  Breakthrough seizure with postictal state versus unwitnessed syncopal episode were felt most likely etiology of her presentation.  Stroke/TIA felt much less likely.  2/14: Concern for worsening mental status/confusion/agitation per nursing.  Neurology reevaluation and repeating EEG, checking UA  Assessment and Plan: * Transient neurological symptoms Slurred speech and Generalized  Weakness Stroke ruled out - MRI negative.  TIA also felt less likely given presentation of diffuse generalized weakness. --Neurology will reevaluate today to decide medication adjustment for AED --Breakthrough seizure vs unwitnessed syncopal episode --Keppra 750 mg BID -Getting repeat EEG, checking UA today due to concern for altered mental status --Seen by PT, OT, SLP / SNF recommended at d/c --Per neurology, no need for vascular imaging given no acute stroke on MRI brain and vessel imaging done 14 months ago. --Orthostatic vitals --Continue daily aspirin, Lipitor --No need for permissive HTN --EEG negative for seizure activity, showed diffuse slowing  Hyponatremia Presented with sodium 129, suspect due to hypovolemia as PO intake reported to be poor at home recently. Na trend: 129 >> (after sepsis fluids) 135 >> 131. --Off IV fluids --Encourage PO intake & hydration --Hold Lasix, suspect mild hypovolemia  BMET    Component Value Date/Time   NA 131 (L) 10/15/2022 0436   K 4.2 10/15/2022 0436   CL 105 10/15/2022 0436   CO2 19 (L) 10/15/2022 0436   GLUCOSE 90 10/15/2022 0436   BUN 20 10/15/2022 0436   CREATININE 1.16 (H) 10/15/2022 0436   CALCIUM 9.0 10/15/2022 0436   GFRNONAA 51 (L) 10/15/2022 0436     Seizure disorder (HCC) Breakthrough seizure felt to be high on the differential with presenting symptoms of postictal state. --Neurology consulted and will be reevaluating today --Continue Keppra 750 mg BID --Continue Vimpat --EEG 2/12 showed diffuse slowing without epileptiform abnormality  -Repeat EEG today due to change in her mental status with potential concern for unwitnessed seizure, checking UA --Follow up with outpatient neurologist  at discharge   Delirium, acute Likely hospital delirium in elderly patient with ?underlying mild cognitive impairment, unknown if dementia history. Possibly related to nicotine withdrawal - seems doing better since nicotine patch  placed. --Delirium precautions --Very low dose IV haldol PRN if persistent agitation and not redirectable --Address bowel, bladder, pain control etc before giving PRN for agitation --Nicotine patch  Chronic kidney disease, stage 3b ([place]) Renal function stable. Monitor BMP. Renally dose meds & avoid nephrotoxins, hypotension  Lab Results  Component Value Date   CREATININE 1.16 (H) 10/15/2022   CREATININE 1.18 (H) 10/14/2022   CREATININE 1.14 (H) 10/13/2022     Weakness PT/OT - SNF recommended. TOC - LC-insurance Auth approved  TIA (transient ischemic attack) Cannot be definitively ruled out, but felt to be less likely etiology, as outlined.     See Transient Neurologic Symptoms.  Chronic diastolic CHF (congestive heart failure) ([place]) Per chart review - Echo Sept 2023 with EF 55-60%, grade II diastolic dysfunction. Continue her Jardiance, Aldactone and Cozaar. Hold Lasix & monitor for Na improvement Daily weights  Coronary artery disease Chronic, stable, no chest pain. Continue aspirin Coreg and statin  Dyslipidemia Continue Lipitor.  Hypertension Continue home medications. No need for permissive HTN  Slurred speech-resolved as of 10/14/2022 Suspected due to post-ictal state vs unwitnessed syncopal episode.  No stroke on MRI.  Generalized weakness with no other focal deficits no consistent with CVA/TIA.   Possible UTI UA shows many bacteria and trace leukocytes.  Will start her on oral Keflex for now      Subjective: Patient shared that she is tired and wants to sleep.  She could tell me that she is in [place] but did not want to talk any further  Physical Exam: Vitals:   10/14/22 1943 10/15/22 0404 10/15/22 0426 10/15/22 0807  BP: (!) 168/84  (!) 152/80 130/74  Pulse: 66  72 79  Resp: 19  20 18  $ Temp: 98 F (36.7 C)  98.5 F (36.9 C) 98.7 F (37.1 C)  TempSrc:      SpO2: 100%  100% 98%  Weight:  54.1 kg    Height:       General exam: 72 year old  female lying in the bed in no acute distress Respiratory system: lungs clear, normal respiratory effort on room air. Cardiovascular system: normal S1/S2, RRR, no pedal edema.   Gastrointestinal system: soft, NT, ND, no HSM felt, +bowel sounds. Central nervous system: exam limited by somnolence, grossly non-focal.  She could tell me that she is in [place] but when I asked about year and her birthday she would not answer Extremities: no edema, normal tone Skin: dry, intact, normal temperature Psychiatry: exam limited by somnolence Data Reviewed:  UA shows many bacteria and trace leukocytes  Family Communication: None at bedside  Disposition: Status is: Inpatient Remains inpatient appropriate because: Monitoring of mental status and further evaluation of seizure  Planned Discharge Destination: Skilled nursing facility   DVT prophylaxis-Lovenox Time spent: 35 minutes  Author: Max Sane, MD 10/15/2022 2:37 PM  For on call review [email or web address].

## 2022-10-15 NOTE — Progress Notes (Signed)
Subjective: Neurology reconsulted for AMS, agitation, not following commands. No witnessed seizure activity.  Objective: Current vital signs: BP 130/74 (BP Location: Left Arm)   Pulse 79   Temp 98.7 F (37.1 C)   Resp 18   Ht 5' 1"$  (1.549 m)   Wt 54.1 kg   SpO2 98%   BMI 22.54 kg/m  Vital signs in last 24 hours: Temp:  [98 F (36.7 C)-98.7 F (37.1 C)] 98.7 F (37.1 C) (02/14 0807) Pulse Rate:  [66-79] 79 (02/14 0807) Resp:  [18-20] 18 (02/14 0807) BP: (130-168)/(74-84) 130/74 (02/14 0807) SpO2:  [98 %-100 %] 98 % (02/14 0807) Weight:  [54.1 kg] 54.1 kg (02/14 0404)  Intake/Output from previous day: 02/13 0701 - 02/14 0700 In: 180 [P.O.:180] Out: -  Intake/Output this shift: No intake/output data recorded. Nutritional status:  Diet Order             DIET DYS 3 Fluid consistency: Thin  Diet effective now                   Exam  Vitals:   10/15/22 0426 10/15/22 0807  BP: (!) 152/80 130/74  Pulse: 72 79  Resp: 20 18  Temp: 98.5 F (36.9 C) 98.7 F (37.1 C)  SpO2: 100% 98%    Gen: patient lying in bed, NAD CV: extremities appear well-perfused Resp: normal WOB  Neurologic exam MS: asleep, difficult to arouse, when she does wake up she will not open her eyes, shouts to leave her alone, oriented to self only, will not follow commands Speech: no dysarthria CN: PERRL, resists eye opening, briefly opens them and tracks examiner, sensation intact, face symmetric, hearing intact to voice Motor: 5/5 strength throughout when resisting examination Sensory: SILT Reflexes: 1+ symm with toes down bilat Coordination: UTA Gait: deferred   Lab Results: Results for orders placed or performed during the hospital encounter of 10/10/22 (from the past 48 hour(s))  Basic metabolic panel     Status: Abnormal   Collection Time: 10/14/22  3:38 AM  Result Value Ref Range   Sodium 131 (L) 135 - 145 mmol/L   Potassium 4.5 3.5 - 5.1 mmol/L   Chloride 104 98 - 111 mmol/L    CO2 18 (L) 22 - 32 mmol/L   Glucose, Bld 77 70 - 99 mg/dL    Comment: Glucose reference range applies only to samples taken after fasting for at least 8 hours.   BUN 19 8 - 23 mg/dL   Creatinine, Ser 1.18 (H) 0.44 - 1.00 mg/dL   Calcium 9.1 8.9 - 10.3 mg/dL   GFR, Estimated 50 (L) >60 mL/min    Comment: (NOTE) Calculated using the CKD-EPI Creatinine Equation (2021)    Anion gap 9 5 - 15    Comment: Performed at [facility], [address] [place], [place] [id number]  Basic metabolic panel     Status: Abnormal   Collection Time: 10/15/22  4:36 AM  Result Value Ref Range   Sodium 131 (L) 135 - 145 mmol/L   Potassium 4.2 3.5 - 5.1 mmol/L   Chloride 105 98 - 111 mmol/L   CO2 19 (L) 22 - 32 mmol/L   Glucose, Bld 90 70 - 99 mg/dL    Comment: Glucose reference range applies only to samples taken after fasting for at least 8 hours.   BUN 20 8 - 23 mg/dL   Creatinine, Ser 1.16 (H) 0.44 - 1.00 mg/dL   Calcium 9.0 8.9 - 10.3 mg/dL  GFR, Estimated 51 (L) >60 mL/min    Comment: (NOTE) Calculated using the CKD-EPI Creatinine Equation (2021)    Anion gap 7 5 - 15    Comment: Performed at [facility], [address] [place], [place] [address]  TSH     Status: None   Collection Time: 10/15/22  4:36 AM  Result Value Ref Range   TSH 0.440 0.350 - 4.500 uIU/mL    Comment: Performed by a 3rd Generation assay with a functional sensitivity of <=0.01 uIU/mL. Performed at [facility], [address] [place], [place] [address]   CBC     Status: Abnormal   Collection Time: 10/15/22  4:36 AM  Result Value Ref Range   WBC 5.4 4.0 - 10.5 K/uL   RBC 3.33 (L) 3.87 - 5.11 MIL/uL   Hemoglobin 9.6 (L) 12.0 - 15.0 g/dL   HCT 28.5 (L) 36.0 - 46.0 %   MCV 85.6 80.0 - 100.0 fL   MCH 28.8 26.0 - 34.0 pg   MCHC 33.7 30.0 - 36.0 g/dL   RDW 14.4 11.5 - 15.5 %   Platelets 172 150 - 400 K/uL   nRBC 0.0 0.0 - 0.2 %    Comment: Performed at [facility], [address] [place], [place] [address]  Urinalysis, Complete w Microscopic -Urine, Clean Catch     Status: Abnormal   Collection Time: 10/15/22 11:22 AM  Result Value Ref Range   Color, Urine AMBER (A) YELLOW    Comment: BIOCHEMICALS MAY BE AFFECTED BY COLOR   APPearance CLOUDY (A) CLEAR   Specific Gravity, Urine 1.024 1.005 - 1.030   pH 5.0 5.0 - 8.0   Glucose, UA 150 (A) NEGATIVE mg/dL   Hgb urine dipstick SMALL (A) NEGATIVE   Bilirubin Urine NEGATIVE NEGATIVE   Ketones, ur NEGATIVE NEGATIVE mg/dL   Protein, ur 30 (A) NEGATIVE mg/dL   Nitrite NEGATIVE NEGATIVE   Leukocytes,Ua TRACE (A) NEGATIVE   RBC / HPF 0-5 0 - 5 RBC/hpf   WBC, UA 6-10 0 - 5 WBC/hpf   Bacteria, UA MANY (A) NONE SEEN   Squamous Epithelial / HPF 11-20 0 - 5 /HPF   Mucus PRESENT    Hyaline Casts, UA PRESENT     Comment: Performed at [facility], [address] [place], [place] [address]    No results found for this or any previous visit (from the past 240 hour(s)).  Lipid Panel No results for input(s): "CHOL", "TRIG", "HDL", "CHOLHDL", "VLDL", "LDLCALC" in the last 72 hours.   Studies/Results: EEG adult  Result Date: 10/13/2022 Derek Jack, MD     10/13/2022  7:40 PM Routine EEG Report COURAGE MOWER is a 72 y.o. female with a history of seizure who is undergoing an EEG to evaluate for seizures. Report: This EEG was acquired with electrodes placed according to the International 10-20 electrode system (including Fp1, Fp2, F3, F4, C3, C4, P3, P4, O1, O2, T3, T4, T5, T6, A1, A2, Fz, Cz, Pz). The following electrodes were missing or displaced: none. The occipital dominant rhythm was primarily composed of activity around 6-8 Hz although at times frequency would reach 8.5 Hz. This activity is reactive to stimulation. Drowsiness was manifested by background fragmentation; deeper stages of sleep were identified by K complexes and sleep spindles. There was no focal slowing. There were no interictal  epileptiform discharges. There were no electrographic seizures identified. Photic stimulation and hyperventilation were not performed. Impression and clinical correlation: This  EEG was obtained while awake and asleep and is abnormal due to mild diffuse slowing indicative of global cerebral dysfunction. Epileptiform abnormalities were not seen during this recording. Su Monks, MD [facility] [phone] If 7pm- 7am, please page neurology on call as listed in [place].    Medications: Scheduled:  aspirin EC  81 mg Oral Daily   atorvastatin  40 mg Oral Daily   carvedilol  12.5 mg Oral BID WC   cephALEXin  500 mg Oral Q6H   cyanocobalamin  1,000 mcg Oral Daily   empagliflozin  10 mg Oral Daily   enoxaparin (LOVENOX) injection  40 mg Subcutaneous Q24H   feeding supplement  237 mL Oral BID BM   gabapentin  300 mg Oral BID   lacosamide  200 mg Oral BID   levETIRAcetam  750 mg Oral BID   losartan  100 mg Oral Daily   multivitamin with minerals  1 tablet Oral Daily   nicotine  14 mg Transdermal Daily   spironolactone  12.5 mg Oral Daily     Assessment: 72 y.o. female with a PMHx of cancer, CAD, HLD, HTN, thyroid disease and seizures (diagnosed with focal epilepsy with impairment of consciousness; reports compliance with her Vimpat and Keppra stating "I take two seizure pills in the morning and two at night; last seizure approximately 2-3 years ago) who presented to the ED on Friday after being found on the floor by family with slurred speech and diffuse weakness. Ddx for presentation favored to be mechanical fall 2/2 fatigue or multifactorial gait dysfunction, less likely breakthrough seizure or presyncope. EEG showed no epileptiform activity. She was continued on home keppra 789m bid and vimpat 1570mbid. MRI brain 2/9 showed no acute process (personal review).  Her mental status change today is c/f post-ictal state after unwitnessed seizure vs delirium/AMS 2/2 infection or metabolic  derangement.  Recommendations: - Head CT wo contrast - Repeat EEG - UA, Ucx - Continue keppra 75081mid - Increase vimpat to 200m51md - Seizure precautions - Will continue to follow.  CollSu Monks [facility] [phone] 7pm- 7am, please page neurology on call as listed in [facility]

## 2022-10-15 NOTE — Plan of Care (Signed)
  Problem: Education: Goal: Knowledge of disease or condition will improve Outcome: Progressing Goal: Knowledge of secondary prevention will improve (MUST DOCUMENT ALL) Outcome: Progressing   Problem: Ischemic Stroke/TIA Tissue Perfusion: Goal: Complications of ischemic stroke/TIA will be minimized Outcome: Progressing

## 2022-10-15 NOTE — Progress Notes (Signed)
Occupational Therapy Treatment Patient Details Name: Anne Phillips MRN: [id number] DOB: 05/14/1951 Today's Date: 10/15/2022   History of present illness Per chart: Anne Phillips is a 72 y.o. female with medical history significant for hypertension, dyslipidemia, coronary artery disease, seizure disorder breast cancer status postmastectomy tobacco use and hypothyroidism, who presented to the ER with 8 onset of fall.  The patient is been feeling generally weak and is not sure how she fell but she did not lose consciousness.  A family member was with her and left her in the kitchen and came later to find her on the floor.  She was having slurred speech then.  No paresthesias or focal muscle weakness.  She denies any head injuries or other injuries.  She thought her legs may give way.  She denies any seizures.   OT comments  Patient received in bed in side lying position and agreeable to OT. Pt endorsing need to have a BM. She completed bed mobility with supervision, functional mobility to the bathroom with Min guard + RW, toilet transfer with Min guard, and clothing management/posterior hygiene in standing with Min guard for safety 2/2 heavily leaning forward. Pt then completed hand hygiene while standing at the sink. During session this date, pt inconsistently followed single step commands, required constant VC for safety awareness, and required reminders to use RW during mobility. Pt left as received with all needs in reach. MD in room at end of session to assess pt 2/2 possible change in cognitive status per RN. Pt is making progress toward goal completion. D/C recommendation remains appropriate. OT will continue to follow acutely.    Recommendations for follow up therapy are one component of a multi-disciplinary discharge planning process, led by the attending physician.  Recommendations may be updated based on patient status, additional functional criteria and insurance authorization.    Follow  Up Recommendations  Skilled nursing-short term rehab (<3 hours/day)     Assistance Recommended at Discharge Frequent or constant Supervision/Assistance  Patient can return home with the following  Assist for transportation;A little help with bathing/dressing/bathroom;A little help with walking and/or transfers;Direct supervision/assist for financial management;Direct supervision/assist for medications management;Assistance with cooking/housework;Help with stairs or ramp for entrance   Equipment Recommendations  Other (comment) (defer to next venue of care)    Recommendations for Other Services      Precautions / Restrictions Precautions Precautions: Fall Restrictions Weight Bearing Restrictions: No       Mobility Bed Mobility Overal bed mobility: Needs Assistance Bed Mobility: Sidelying to Sit, Sit to Sidelying   Sidelying to sit: Supervision     Sit to sidelying: Supervision      Transfers Overall transfer level: Needs assistance Equipment used: Rolling walker (2 wheels) Transfers: Sit to/from Stand Sit to Stand: Min guard (from EOB)                 Balance Overall balance assessment: Needs assistance Sitting-balance support: Feet supported, No upper extremity supported Sitting balance-Leahy Scale: Good     Standing balance support: During functional activity, Single extremity supported Standing balance-Leahy Scale: Fair                             ADL either performed or assessed with clinical judgement   ADL Overall ADL's : Needs assistance/impaired     Grooming: Min guard;Wash/dry hands;Standing               Lower Body Dressing:  Sitting/lateral leans;Set up;Supervision/safety Lower Body Dressing Details (indicate cue type and reason): to don new brief while sitting on toilet Toilet Transfer: Regular Toilet;Rolling walker (2 wheels);Min guard;Grab bars   Toileting- Clothing Manipulation and Hygiene: Min guard;Sit to/from  stand;Cueing for safety Toileting - Clothing Manipulation Details (indicate cue type and reason): for clothing management and posterior hygiene after continent void on toilet     Functional mobility during ADLs: Min guard;Rolling walker (2 wheels) (~20 ft to the bathroom)      Extremity/Trunk Assessment Upper Extremity Assessment Upper Extremity Assessment: Generalized weakness   Lower Extremity Assessment Lower Extremity Assessment: Generalized weakness   Cervical / Trunk Assessment Cervical / Trunk Assessment: Kyphotic    Vision Baseline Vision/History: 1 Wears glasses Patient Visual Report: No change from baseline     Perception     Praxis      Cognition Arousal/Alertness: Awake/alert Behavior During Therapy: Impulsive Overall Cognitive Status: No family/caregiver present to determine baseline cognitive functioning                                 General Comments: Attempting to ask pt orientation questions, when OT asked pt current location pt stating, "I don't care where we are I need to go to the bathroom."  Pt able to state correct age, attempted to have pt smile in front of mirror but unable to follow command (RN reported possible facial droop). Inconsistently followed single step commands, constant VC for safety awareness, reminders to use RW during mobility.        Exercises      Shoulder Instructions       General Comments      Pertinent Vitals/ Pain       Pain Assessment Pain Assessment: No/denies pain  Home Living                                          Prior Functioning/Environment              Frequency  Min 2X/week        Progress Toward Goals  OT Goals(current goals can now be found in the care plan section)  Progress towards OT goals: Progressing toward goals  Acute Rehab OT Goals Patient Stated Goal: Pt wants to return home OT Goal Formulation: With patient Time For Goal Achievement:  10/25/22 Potential to Achieve Goals: Good  Plan Discharge plan remains appropriate;Frequency remains appropriate    Co-evaluation                 AM-PAC OT "6 Clicks" Daily Activity     Outcome Measure   Help from another person eating meals?: None Help from another person taking care of personal grooming?: None Help from another person toileting, which includes using toliet, bedpan, or urinal?: A Little Help from another person bathing (including washing, rinsing, drying)?: A Little Help from another person to put on and taking off regular upper body clothing?: None Help from another person to put on and taking off regular lower body clothing?: A Little 6 Click Score: 21    End of Session Equipment Utilized During Treatment: Rolling walker (2 wheels);Gait belt  OT Visit Diagnosis: Muscle weakness (generalized) (M62.81);History of falling (Z91.81)   Activity Tolerance Patient tolerated treatment well   Patient Left in bed;with call bell/phone within reach;with bed  alarm set;Other (comment) (MD in room at end of session to assess pt 2/2 possible change in cognitive status per RN)   Nurse Communication Mobility status;Other (comment) (pt having loose stools)        Time: [id number] OT Time Calculation (min): 9 min  Charges: OT General Charges $OT Visit: 1 Visit OT Treatments $Self Care/Home Management : 8-22 mins  [facility] MS, OTR/L ascom [phone]  10/15/22, 1:43 PM

## 2022-10-15 NOTE — TOC Progression Note (Addendum)
Transition of Care [facility]) - Progression Note    Patient Details  Name: CAELEN RAHILL MRN: [id number] Date of Birth: 06/20/1951  Transition of [facility] CM/SW Contact  Gerilyn Pilgrim, LCSW Phone Number: 10/15/2022, 9:13 AM  Clinical Narrative:   Josem Kaufmann obtained for [employer]. Auth ID [id number] Navi ID [id number].  11:37am Per MD, pt now not medically stable for discharge. [employer] notified. CSW will continue to follow for updates.    Expected Discharge Plan: [place] Barriers to Discharge: Continued Medical Work up  Expected Discharge Plan and Services     Post Acute Care Choice: [place] Living arrangements for the past 2 months: Single Family Home                                       Social Determinants of Health (SDOH) Interventions SDOH Screenings   Food Insecurity: No Food Insecurity (10/11/2022)  Housing: Low Risk  (10/11/2022)  Transportation Needs: No Transportation Needs (10/11/2022)  Utilities: Not At Risk (10/11/2022)  Tobacco Use: High Risk (10/14/2022)    Readmission Risk Interventions    10/12/2022   11:51 AM  Readmission Risk Prevention Plan  PCP or Specialist Appt within 3-5 Days Complete  Social Work Consult for [place] Planning/Counseling Complete  Palliative Care Screening Not Applicable

## 2022-10-15 NOTE — Progress Notes (Signed)
Mobility Specialist - Progress Note   10/15/22 1600  Mobility  Activity Ambulated with assistance in hallway;Transferred from bed to chair  Level of Assistance Standby assist, set-up cues, supervision of patient - no hands on  Assistive Device Front wheel walker;None  Distance Ambulated (ft) 120 ft  Activity Response Tolerated well  $Mobility charge 1 Mobility     Mobility responded to bed alarm, pt standing at sink at time of arrival. Redirection required. Pt ambulated in hallway, initially without AD but a little unsteady so RW was provided. VC to keep RW close to body. Upon return to room, pt transferred to recliner where she was left with alarm set, needs in reach.    Kathee Delton Mobility Specialist 10/15/22, 4:12 PM

## 2022-10-15 NOTE — Progress Notes (Signed)
Eeg done 

## 2022-10-16 DIAGNOSIS — I5032 Chronic diastolic (congestive) heart failure: Secondary | ICD-10-CM | POA: Diagnosis not present

## 2022-10-16 DIAGNOSIS — G40909 Epilepsy, unspecified, not intractable, without status epilepticus: Secondary | ICD-10-CM | POA: Diagnosis not present

## 2022-10-16 DIAGNOSIS — R29818 Other symptoms and signs involving the nervous system: Secondary | ICD-10-CM | POA: Diagnosis not present

## 2022-10-16 DIAGNOSIS — E871 Hypo-osmolality and hyponatremia: Secondary | ICD-10-CM | POA: Diagnosis not present

## 2022-10-16 LAB — URINE CULTURE

## 2022-10-16 LAB — CBC
HCT: 28.5 % — ABNORMAL LOW (ref 36.0–46.0)
Hemoglobin: 9.5 g/dL — ABNORMAL LOW (ref 12.0–15.0)
MCH: 28.7 pg (ref 26.0–34.0)
MCHC: 33.3 g/dL (ref 30.0–36.0)
MCV: 86.1 fL (ref 80.0–100.0)
Platelets: 183 10*3/uL (ref 150–400)
RBC: 3.31 MIL/uL — ABNORMAL LOW (ref 3.87–5.11)
RDW: 14.3 % (ref 11.5–15.5)
WBC: 5.9 10*3/uL (ref 4.0–10.5)
nRBC: 0 % (ref 0.0–0.2)

## 2022-10-16 LAB — BASIC METABOLIC PANEL
Anion gap: 7 (ref 5–15)
BUN: 22 mg/dL (ref 8–23)
CO2: 18 mmol/L — ABNORMAL LOW (ref 22–32)
Calcium: 8.7 mg/dL — ABNORMAL LOW (ref 8.9–10.3)
Chloride: 103 mmol/L (ref 98–111)
Creatinine, Ser: 1.19 mg/dL — ABNORMAL HIGH (ref 0.44–1.00)
GFR, Estimated: 49 mL/min — ABNORMAL LOW (ref >60)
Glucose, Bld: 88 mg/dL (ref 70–99)
Potassium: 4.4 mmol/L (ref 3.5–5.1)
Sodium: 128 mmol/L — ABNORMAL LOW (ref 135–145)

## 2022-10-16 MED ORDER — LACOSAMIDE 50 MG PO TABS
150.0000 mg | ORAL_TABLET | Freq: Two times a day (BID) | ORAL | Status: DC
  Administered 2022-10-16 – 2022-10-17 (×2): 150 mg via ORAL
  Filled 2022-10-16 (×2): qty 3

## 2022-10-16 NOTE — Plan of Care (Signed)
  Problem: Education: Goal: Knowledge of disease or condition will improve Outcome: Progressing   Problem: Coping: Goal: Will verbalize positive feelings about self Outcome: Progressing

## 2022-10-16 NOTE — Progress Notes (Addendum)
Progress Note   Patient: Anne Phillips [id number] DOB: [birth year] DOA: 10/10/2022     6 DOS: the patient was seen and examined on 10/16/2022   Brief hospital course: Anne Phillips is a 72 y.o. female with medical history significant for hypertension, dyslipidemia, coronary artery disease, seizure disorder breast cancer status postmastectomy tobacco use and hypothyroidism, who presented to the ER with 8 onset of fall.  The patient is been feeling generally weak and is not sure how she fell but she did not lose consciousness.  A family member was with her and left her in the kitchen and came later to find her on the floor.  She was having slurred speech then.  No paresthesias or focal muscle weakness.  She denies any head injuries or other injuries.  She thought her legs may give way.  She denies any seizures.  No urinary or stool incontinence or tongue bites. No fever/chills or infectious symptoms.  ED evaluation revealed hyponatremia with sodium 129, bicarb 19, BUN 19, Cr 1.23 (baseline), normal lactic acid.  CBC with mild anemia and no leukocytosis.  She was afebrile with elevated BP 164/76, other vitals normal.   Due to report of slurred speech and diffuse weakness, neurology was consulted and stroke work-up was initiated.  CT head w/o contrast no acute findings, showed chronic small vessel disease.    MRI brain was negative for acute stroke, showed mild atrophy seen in advanced age, remote lacunar infarcts, and chronic small vessel disease.  Patient admitted to the hospital.  Neurology was consulted.  Breakthrough seizure with postictal state versus unwitnessed syncopal episode were felt most likely etiology of her presentation.  Stroke/TIA felt much less likely.  2/14: Concern for worsening mental status/confusion/agitation per nursing.  Neurology reevaluation and repeating EEG, checking UA 2/15: UA concerning for UTI, started Keflex.  Neurology increased Vimpat to 200 mg twice daily,  sodium 128, nephrology consult  Assessment and Plan: * Transient neurological symptoms Slurred speech and Generalized Weakness Stroke ruled out - MRI negative.  TIA also felt less likely given presentation of diffuse generalized weakness. --Neurology will reevaluate today to decide medication adjustment for AED --Breakthrough seizure vs unwitnessed syncopal episode --Keppra 750 mg BID -Increased Vimpat to 200 mg p.o. twice daily -repeat EEG, -UA concerning for UTI so started Keflex yesterday --Seen by PT, OT, SLP / SNF recommended at d/c --Per neurology, no need for vascular imaging given no acute stroke on MRI brain and vessel imaging done 14 months ago. --Continue daily aspirin, Lipitor --EEG negative for seizure activity, showed diffuse slowing  Hyponatremia Presented with sodium 129, suspect due to hypovolemia as PO intake reported to be poor at home recently. Na trend: 129 >> (after sepsis fluids) 135 >> 128. -- Nephrology consult  BMET    Component Value Date/Time   NA 128 (L) 10/16/2022 0523   K 4.4 10/16/2022 0523   CL 103 10/16/2022 0523   CO2 18 (L) 10/16/2022 0523   GLUCOSE 88 10/16/2022 0523   BUN 22 10/16/2022 0523   CREATININE 1.19 (H) 10/16/2022 0523   CALCIUM 8.7 (L) 10/16/2022 0523   GFRNONAA 49 (L) 10/16/2022 0523     Seizure disorder (HCC) Breakthrough seizure felt to be high on the differential with presenting symptoms of postictal state. --Neurology consulted and will be reevaluating today --Continue Keppra 750 mg BID --Continue Vimpat at 200 mg twice daily --EEG 2/12 showed diffuse slowing without epileptiform abnormality  -Repeat EEG on 2/14 due to change in her  mental status with potential concern for unwitnessed seizure,  UA concerning for UTI so started on Keflex --Follow up with outpatient neurologist at discharge   Delirium, acute Likely hospital delirium in elderly patient with ?underlying mild cognitive impairment, unknown if dementia  history. Possibly related to nicotine withdrawal - seems doing better since nicotine patch placed. --Delirium precautions --Very low dose IV haldol PRN if persistent agitation and not redirectable --Address bowel, bladder, pain control etc before giving PRN for agitation --Nicotine patch  Chronic kidney disease, stage 3b ([place]) Renal function stable. Nephrology consult for hyponatremia Renally dose meds & avoid nephrotoxins, hypotension  Lab Results  Component Value Date   CREATININE 1.19 (H) 10/16/2022   CREATININE 1.16 (H) 10/15/2022   CREATININE 1.18 (H) 10/14/2022     Weakness PT/OT - SNF recommended. TOC - LC-insurance Auth approved.  TIA (transient ischemic attack) Cannot be definitively ruled out, but felt to be less likely etiology, as outlined.     See Transient Neurologic Symptoms  Chronic diastolic CHF (congestive heart failure) ([place]) Per chart review - Echo Sept 2023 with EF 55-60%, grade II diastolic dysfunction. Continue her Jardiance, Aldactone and Cozaar. Hold Lasix & monitor for Na improvement Daily weights  Coronary artery disease Chronic, stable, no chest pain. Continue aspirin Coreg and statin  Dyslipidemia Continue Lipitor.  Hypertension Continue home medications.  Slurred speech-resolved as of 10/14/2022 Suspected due to post-ictal state vs unwitnessed syncopal episode.  No stroke on MRI.  Generalized weakness with no other focal deficits no consistent with CVA/TIA.         Subjective: Not interested in talking.  Feeling weak.  Does not feel she would go today.  Hoping to go home instead of rehab  Physical Exam: Vitals:   10/15/22 1933 10/16/22 0030 10/16/22 0410 10/16/22 0807  BP: 110/63 115/62 118/64 136/77  Pulse: 62 65 66 66  Resp: 18 16 17 12  $ Temp: 98.3 F (36.8 C) 98.6 F (37 C) 98.2 F (36.8 C) 98.5 F (36.9 C)  TempSrc: Oral Oral Oral Oral  SpO2: 100%  95% 100%  Weight:      Height:       General exam: 72 year old  female lying in the bed in no acute distress, not interested in talking and wants me to leave her alone Respiratory system: lungs clear, normal respiratory effort on room air. Cardiovascular system: normal S1/S2, RRR, no pedal edema.   Gastrointestinal system: soft, NT, ND, no HSM felt, +bowel sounds. Central nervous system: exam limited by somnolence, grossly non-focal. Extremities: no edema, normal tone Skin: dry, intact, normal temperature Psychiatry: Normal mood and affect Data Reviewed:  Sodium 128, UA showed many bacteria  Family Communication: None at bedside  Disposition: Status is: Inpatient Remains inpatient appropriate because: Management of altered mental status, hyponatremia, pending nephro consult  Planned Discharge Destination: Skilled nursing facility   DVT prophylaxis-Lovenox Time spent: 35 minutes  Author: Max Sane, MD 10/16/2022 12:56 PM  For on call review [email or web address].

## 2022-10-16 NOTE — Assessment & Plan Note (Signed)
Per chart review - Echo Sept 2023 with EF 55-60%, grade II diastolic dysfunction. Continue her Jardiance, Aldactone and Cozaar. Hold Lasix & monitor for Na improvement Daily weights

## 2022-10-16 NOTE — Assessment & Plan Note (Signed)
Slurred speech and Generalized Weakness Stroke ruled out - MRI negative.  TIA also felt less likely given presentation of diffuse generalized weakness. --Neurology will reevaluate today to decide medication adjustment for AED --Breakthrough seizure vs unwitnessed syncopal episode --Keppra 750 mg BID -Increased Vimpat to 200 mg p.o. twice daily -repeat EEG, -UA concerning for UTI so started Keflex yesterday --Seen by PT, OT, SLP / SNF recommended at d/c --Per neurology, no need for vascular imaging given no acute stroke on MRI brain and vessel imaging done 14 months ago. --Continue daily aspirin, Lipitor --EEG negative for seizure activity, showed diffuse slowing

## 2022-10-16 NOTE — Assessment & Plan Note (Signed)
Breakthrough seizure felt to be high on the differential with presenting symptoms of postictal state. --Neurology consulted and will be reevaluating today --Continue Keppra 750 mg BID --Continue Vimpat at 200 mg twice daily --EEG 2/12 showed diffuse slowing without epileptiform abnormality  -Repeat EEG on 2/14 due to change in her mental status with potential concern for unwitnessed seizure,  UA concerning for UTI so started on Keflex --Follow up with outpatient neurologist at discharge

## 2022-10-16 NOTE — Plan of Care (Signed)
Neurology plan of care  Change in mental status yesterday is explained by UA c/f UTI. Head CT personal review showed no acute process. EEG was unchanged and showed no epileptiform activity. In light of this I will decrease her vimpat down to her previous dose of 140m bid. Continue keppra 7519mbid. Treat UTI as primary team is doing. Neurology will be available prn for questions going forward.  CoSu MonksMD [facility] [phone] 7pm- 7am, please page neurology on call as listed in [facility]

## 2022-10-16 NOTE — Assessment & Plan Note (Signed)
PT/OT - SNF recommended. TOC - LC-insurance Auth approved.

## 2022-10-16 NOTE — Plan of Care (Signed)
  Problem: Education: Goal: Knowledge of disease or condition will improve Outcome: Progressing   Problem: Coping: Goal: Will verbalize positive feelings about self Outcome: Progressing Goal: Will identify appropriate support needs Outcome: Progressing   Problem: Health Behavior/Discharge Planning: Goal: Ability to manage health-related needs will improve Outcome: Progressing   Problem: Self-Care: Goal: Ability to participate in self-care as condition permits will improve Outcome: Progressing Goal: Verbalization of feelings and concerns over difficulty with self-care will improve Outcome: Progressing Goal: Ability to communicate needs accurately will improve Outcome: Progressing

## 2022-10-16 NOTE — Progress Notes (Signed)
Physical Therapy Treatment Patient Details Name: Anne Phillips MRN: [id number] DOB: 1951/03/11 Today's Date: 10/16/2022   History of Present Illness Per chart: Anne Phillips is a 72 y.o. female with medical history significant for hypertension, dyslipidemia, coronary artery disease, seizure disorder breast cancer status postmastectomy tobacco use and hypothyroidism, who presented to the ER with 8 onset of fall.  The patient is been feeling generally weak and is not sure how she fell but she did not lose consciousness.  A family member was with her and left her in the kitchen and came later to find her on the floor.  She was having slurred speech then.  No paresthesias or focal muscle weakness.  She denies any head injuries or other injuries.  She thought her legs may give way.  She denies any seizures.    PT Comments    Pt is progressing with demonstrating mod I with bed mobilty, supervision with transfers, standing balance and gait with RW.  Pt requires encouragement to use RW and cues on how to use walker safely.  When distracted during ambulation, pt kept bumping into wall on right side but had no LOB. Pt would benefit with continued PT to address safety issues with mobility. Current D/C plan is still appropriate.  Recommendations for follow up therapy are one component of a multi-disciplinary discharge planning process, led by the attending physician.  Recommendations may be updated based on patient status, additional functional criteria and insurance authorization.  Follow Up Recommendations  Skilled nursing-short term rehab (<3 hours/day) Can patient physically be transported by private vehicle: Yes   Assistance Recommended at Discharge Frequent or constant Supervision/Assistance  Patient can return home with the following A little help with walking and/or transfers;A little help with bathing/dressing/bathroom;Assistance with cooking/housework;Assist for transportation;Help with stairs  or ramp for entrance   Equipment Recommendations  Rolling walker (2 wheels)    Recommendations for Other Services       Precautions / Restrictions Precautions Precautions: Fall Restrictions Weight Bearing Restrictions: No     Mobility  Bed Mobility Overal bed mobility: Independent Bed Mobility: Supine to Sit, Sit to Supine   Sidelying to sit: Independent Supine to sit: Independent Sit to supine: Independent Sit to sidelying: Independent General bed mobility comments: Pt motivated to get up to use the bathroom.    Transfers Overall transfer level: Needs assistance Equipment used: Rolling walker (2 wheels) Transfers: Bed to chair/wheelchair/BSC     Step pivot transfers: Supervision       General transfer comment: Even with cues pt steps away from walker for transfers.  No LOB noted.    Ambulation/Gait Ambulation/Gait assistance: Supervision Gait Distance (Feet): 120 Feet Assistive device: Rolling walker (2 wheels) Gait Pattern/deviations: Decreased step length - right, Decreased step length - left, Decreased stride length, Decreased dorsiflexion - left, Decreased dorsiflexion - right Gait velocity: decreased     General Gait Details: Ambulated around nursing station.  Pt kept walker but required cues to not run into the wall on the left.   Stairs             Wheelchair Mobility    Modified Rankin (Stroke Patients Only)       Balance Overall balance assessment: Independent Sitting-balance support: Feet unsupported Sitting balance-Leahy Scale: Good Sitting balance - Comments: steady sitting reaching within BOS   Standing balance support: During functional activity, Single extremity supported Standing balance-Leahy Scale: Good Standing balance comment: No LOB with functional UE activities.  Cognition Arousal/Alertness: Awake/alert   Overall Cognitive Status: No family/caregiver present to determine  baseline cognitive functioning                                 General Comments: constant VC for safety awareness, reminders to use RW during mobility.        Exercises      General Comments        Pertinent Vitals/Pain Pain Assessment Pain Assessment: Faces Faces Pain Scale: Hurts little more Pain Descriptors / Indicators:  ("Legs hurts") Pain Intervention(s): Monitored during session    Home Living                          Prior Function            PT Goals (current goals can now be found in the care plan section) Acute Rehab PT Goals Patient Stated Goal: improve her mobility. PT Goal Formulation: With patient Time For Goal Achievement: 10/25/22 Potential to Achieve Goals: Fair Progress towards PT goals: Progressing toward goals    Frequency    Min 2X/week      PT Plan Current plan remains appropriate    Co-evaluation              AM-PAC PT "6 Clicks" Mobility   Outcome Measure  Help needed turning from your back to your side while in a flat bed without using bedrails?: None Help needed moving from lying on your back to sitting on the side of a flat bed without using bedrails?: None Help needed moving to and from a bed to a chair (including a wheelchair)?: A Little Help needed standing up from a chair using your arms (e.g., wheelchair or bedside chair)?: A Little Help needed to walk in hospital room?: A Little Help needed climbing 3-5 steps with a railing? : A Little 6 Click Score: 20    End of Session Equipment Utilized During Treatment: Gait belt Activity Tolerance: Patient tolerated treatment well Patient left: in bed;with call bell/phone within reach;with bed alarm set Nurse Communication: Mobility status;Precautions PT Visit Diagnosis: Difficulty in walking, not elsewhere classified (R26.2);Unsteadiness on feet (R26.81);Muscle weakness (generalized) (M62.81)     Time: 1244-1300 PT Time Calculation (min) (ACUTE  ONLY): 16 min  Charges:  $Therapeutic Activity: 8-22 mins                     Bjorn Loser, PTA  10/16/22, 1:25 PM

## 2022-10-16 NOTE — Procedures (Signed)
Routine EEG Report  Anne Phillips is a 72 y.o. female with a history of seizure who is undergoing an EEG to evaluate for seizures.  Report: This EEG was acquired with electrodes placed according to the International 10-20 electrode system (including Fp1, Fp2, F3, F4, C3, C4, P3, P4, O1, O2, T3, T4, T5, T6, A1, A2, Fz, Cz, Pz). The following electrodes were missing or displaced: none.  The occipital dominant rhythm was primarily composed of activity around 6-8 Hz although at times frequency would reach 8.5 Hz. This activity is reactive to stimulation. Drowsiness was manifested by background fragmentation; deeper stages of sleep were identified by K complexes and sleep spindles. There was no focal slowing. There were no interictal epileptiform discharges. There were no electrographic seizures identified. Photic stimulation and hyperventilation were not performed.  Impression and clinical correlation: This EEG was obtained while awake and asleep and is abnormal due to mild diffuse slowing indicative of global cerebral dysfunction. Epileptiform abnormalities were not seen during this recording. This EEG is unchanged from that performed on 10/13/22.  Su Monks, MD [facility] [phone]  If 7pm- 7am, please page neurology on call as listed in [place].

## 2022-10-16 NOTE — Plan of Care (Addendum)
GOC consult noted. Notes reviewed. Spoke with attending via epic chat. As per conversation, will hold off on consult at this time. PMT will continue to shadow. Please notify PMT for more urgent needs.

## 2022-10-16 NOTE — Assessment & Plan Note (Signed)
Cannot be definitively ruled out, but felt to be less likely etiology, as outlined.     See Transient Neurologic Symptoms

## 2022-10-16 NOTE — Assessment & Plan Note (Signed)
Continue home medications. 

## 2022-10-16 NOTE — Assessment & Plan Note (Signed)
Likely hospital delirium in elderly patient with ?underlying mild cognitive impairment, unknown if dementia history. Possibly related to nicotine withdrawal - seems doing better since nicotine patch placed. --Delirium precautions --Very low dose IV haldol PRN if persistent agitation and not redirectable --Address bowel, bladder, pain control etc before giving PRN for agitation --Nicotine patch

## 2022-10-16 NOTE — Assessment & Plan Note (Signed)
Chronic, stable, no chest pain. Continue aspirin Coreg and statin

## 2022-10-16 NOTE — Assessment & Plan Note (Signed)
-  Continue Lipitor °

## 2022-10-16 NOTE — Assessment & Plan Note (Signed)
Renal function stable. Nephrology consult for hyponatremia Renally dose meds & avoid nephrotoxins, hypotension  Lab Results  Component Value Date   CREATININE 1.19 (H) 10/16/2022   CREATININE 1.16 (H) 10/15/2022   CREATININE 1.18 (H) 10/14/2022

## 2022-10-16 NOTE — TOC Progression Note (Signed)
Transition of Care [facility]) - Progression Note    Patient Details  Name: Anne Phillips MRN: [id number] Date of Birth: [birth year]  [facility]) CM/SW Contact  Gerilyn Pilgrim, LCSW Phone Number: 10/16/2022, 4:00 PM  Clinical Narrative:  Per MD, pt can admit to SNF tomorrow if sodium levels are stable. CSW will follow up in AM.    Expected Discharge Plan: [place] Barriers to Discharge: Continued Medical Work up  Expected Discharge Plan and Services     Post Acute Care Choice: [place] Living arrangements for the past 2 months: Single Family Home                                       Social Determinants of Health (SDOH) Interventions SDOH Screenings   Food Insecurity: No Food Insecurity (10/11/2022)  Housing: Low Risk  (10/11/2022)  Transportation Needs: No Transportation Needs (10/11/2022)  Utilities: Not At Risk (10/11/2022)  Tobacco Use: High Risk (10/14/2022)    Readmission Risk Interventions    10/12/2022   11:51 AM  Readmission Risk Prevention Plan  PCP or Specialist Appt within 3-5 Days Complete  Social Work Consult for [place] Planning/Counseling Complete  Palliative Care Screening Not Applicable

## 2022-10-16 NOTE — Assessment & Plan Note (Signed)
Presented with sodium 129, suspect due to hypovolemia as PO intake reported to be poor at home recently. Na trend: 129 >> (after sepsis fluids) 135 >> 128. -- Nephrology consult  BMET    Component Value Date/Time   NA 128 (L) 10/16/2022 0523   K 4.4 10/16/2022 0523   CL 103 10/16/2022 0523   CO2 18 (L) 10/16/2022 0523   GLUCOSE 88 10/16/2022 0523   BUN 22 10/16/2022 0523   CREATININE 1.19 (H) 10/16/2022 0523   CALCIUM 8.7 (L) 10/16/2022 0523   GFRNONAA 49 (L) 10/16/2022 [id number]

## 2022-10-17 LAB — BASIC METABOLIC PANEL
Anion gap: 8 (ref 5–15)
Anion gap: 7 (ref 5–15)
BUN: 22 mg/dL (ref 8–23)
BUN: 22 mg/dL (ref 8–23)
CO2: 18 mmol/L — ABNORMAL LOW (ref 22–32)
CO2: 21 mmol/L — ABNORMAL LOW (ref 22–32)
Calcium: 8.3 mg/dL — ABNORMAL LOW (ref 8.9–10.3)
Calcium: 9 mg/dL (ref 8.9–10.3)
Chloride: 97 mmol/L — ABNORMAL LOW (ref 98–111)
Chloride: 101 mmol/L (ref 98–111)
Creatinine, Ser: 1.25 mg/dL — ABNORMAL HIGH (ref 0.44–1.00)
Creatinine, Ser: 1.21 mg/dL — ABNORMAL HIGH (ref 0.44–1.00)
GFR, Estimated: 46 mL/min — ABNORMAL LOW (ref >60)
GFR, Estimated: 48 mL/min — ABNORMAL LOW (ref >60)
Glucose, Bld: 97 mg/dL (ref 70–99)
Glucose, Bld: 85 mg/dL (ref 70–99)
Potassium: 4.3 mmol/L (ref 3.5–5.1)
Potassium: 4.1 mmol/L (ref 3.5–5.1)
Sodium: 123 mmol/L — ABNORMAL LOW (ref 135–145)
Sodium: 129 mmol/L — ABNORMAL LOW (ref 135–145)

## 2022-10-17 LAB — HEPATIC FUNCTION PANEL
ALT: 13 U/L (ref 0–44)
AST: 21 U/L (ref 15–41)
Albumin: 3.7 g/dL (ref 3.5–5.0)
Alkaline Phosphatase: 134 U/L — ABNORMAL HIGH (ref 38–126)
Bilirubin, Direct: 0.1 mg/dL (ref 0.0–0.2)
Indirect Bilirubin: 0.4 mg/dL (ref 0.3–0.9)
Total Bilirubin: 0.5 mg/dL (ref 0.3–1.2)
Total Protein: 7.6 g/dL (ref 6.5–8.1)

## 2022-10-17 LAB — CBC
HCT: 28.9 % — ABNORMAL LOW (ref 36.0–46.0)
Hemoglobin: 9.5 g/dL — ABNORMAL LOW (ref 12.0–15.0)
MCH: 28.2 pg (ref 26.0–34.0)
MCHC: 32.9 g/dL (ref 30.0–36.0)
MCV: 85.8 fL (ref 80.0–100.0)
Platelets: 195 10*3/uL (ref 150–400)
RBC: 3.37 MIL/uL — ABNORMAL LOW (ref 3.87–5.11)
RDW: 14.3 % (ref 11.5–15.5)
WBC: 6.1 10*3/uL (ref 4.0–10.5)
nRBC: 0 % (ref 0.0–0.2)

## 2022-10-17 MED ORDER — LEVETIRACETAM 750 MG PO TABS: 750.0000 mg | ORAL_TABLET | Freq: Two times a day (BID) | ORAL | Status: DC

## 2022-10-17 MED ORDER — ENSURE ENLIVE PO LIQD: 237.0000 mL | Freq: Two times a day (BID) | ORAL | 12 refills | Status: DC

## 2022-10-17 MED ORDER — ASPIRIN 81 MG PO TBEC: 81.0000 mg | DELAYED_RELEASE_TABLET | Freq: Every day | ORAL | 12 refills | Status: DC

## 2022-10-17 MED ORDER — ADULT MULTIVITAMIN W/MINERALS CH: 1.0000 | ORAL_TABLET | Freq: Every day | ORAL | Status: DC

## 2022-10-17 MED ORDER — HYDROXYZINE HCL 10 MG PO TABS
5.0000 mg | ORAL_TABLET | Freq: Once | ORAL | Status: AC
  Administered 2022-10-17: 5 mg via ORAL
  Filled 2022-10-17: qty 1

## 2022-10-17 MED ORDER — TOLVAPTAN 15 MG PO TABS
15.0000 mg | ORAL_TABLET | Freq: Once | ORAL | Status: AC
  Administered 2022-10-17: 15 mg via ORAL
  Filled 2022-10-17: qty 1

## 2022-10-17 MED ORDER — NICOTINE 14 MG/24HR TD PT24: 14.0000 mg | MEDICATED_PATCH | Freq: Every day | TRANSDERMAL | 0 refills | Status: DC

## 2022-10-17 MED ORDER — FOOD SUPPLEMENT, LACTOSE-REDUCED 0.08 GRAM-1.5 KCAL/ML ORAL LIQUID
12 refills | 0 days
Start: 2022-10-17 — End: ?

## 2022-10-17 MED ORDER — ASPIRIN 81 MG TABLET,DELAYED RELEASE
ORAL_TABLET | Freq: Every day | ORAL | 12 refills | 30 days
Start: 2022-10-17 — End: ?

## 2022-10-17 MED ORDER — NICOTINE 14 MG/24 HR DAILY TRANSDERMAL PATCH
MEDICATED_PATCH | TRANSDERMAL | 0 refills | 0 days
Start: 2022-10-17 — End: ?

## 2022-10-17 NOTE — Progress Notes (Signed)
Mobility Specialist - Progress Note    10/17/22 1500  Mobility  Activity Stood at bedside;Ambulated independently to bathroom  Level of Assistance Modified independent, requires aide device or extra time  Assistive Device Front wheel walker  Distance Ambulated (ft) 10 ft  Range of Motion/Exercises Active  Activity Response Tolerated well  Mobility Referral Yes  $Mobility charge 1 Mobility   Author responding to bed alarm patient sitting EOB uncomfortably. Pt STS and ambulates to bathroom ModI with AD. Pt returned to bed and left with needs in reach and bed alarm activated.

## 2022-10-17 NOTE — Progress Notes (Addendum)
Nutrition Follow-up  DOCUMENTATION CODES:   Not applicable  INTERVENTION:   -Continue dysphagia 3 diet for ease of intake -Ensure Enlive po BID, each supplement provides 350 kcal and 20 grams of protein -MVI with minerals daily  NUTRITION DIAGNOSIS:   Increased nutrient needs related to acute illness as evidenced by estimated needs.  Ongoing  GOAL:   Patient will meet greater than or equal to 90% of their needs  Progressing   MONITOR:   PO intake, Supplement acceptance, Labs, Weight trends, I & O's  REASON FOR ASSESSMENT:   Malnutrition Screening Tool    ASSESSMENT:   72 y.o. female with medical history of HTN, dyslipidemia, CAD, seizure disorder, breast cancer s/p mastectomy, tobacco use, and hypothyroidism. She presented to the ED after a fall and slurred speech noted by a family member. She has been experiencing generalized weakness.  2/14- EEG negative for seizure activity  Reviewed I/O's: +798 ml x 24 hours   Pt resting in recliner chair at time of visit. She did not respond to voice. Noted blankets covering her head.   Pt remains on a dysphagia 3 diet for ease of intake. No meal completion data available. Pt is drinking Ensure supplements.   Per palliative care team, plan to hold off on evaluation for now.   Per TOC notes, plan SNF placement once medically stable.   Medications reviewed and include vitamin B-12, jardiance, and keppra.   Labs reviewed: Na: 123, CBGS: 96.    Diet Order:   Diet Order             DIET DYS 3 Fluid consistency: Thin  Diet effective now                   EDUCATION NEEDS:   Education needs have been addressed  Skin:  Skin Assessment: Reviewed RN Assessment  Last BM:  10/15/22  Height:   Ht Readings from Last 1 Encounters:  10/11/22 5' 1"$  (1.549 m)    Weight:   Wt Readings from Last 1 Encounters:  10/15/22 54.1 kg    Ideal Body Weight:  47.7 kg  BMI:  Body mass index is 22.54 kg/m.  Estimated  Nutritional Needs:   Kcal:  1600-1800  Protein:  80-95 grams  Fluid:  > 1.6 L    Loistine Chance, RD, LDN, [place] Registered Dietitian II Certified Diabetes Care and Education Specialist Please refer to [facility] for RD and/or RD on-call/weekend/after hours pager

## 2022-10-17 NOTE — Care Management Important Message (Signed)
Important Message  Patient Details  Name: Anne Phillips MRN: [id number] Date of Birth: 02-Feb-1951   Medicare Important Message Given:  Yes     [name] 10/17/2022, 11:24 AM

## 2022-10-17 NOTE — Consult Note (Signed)
[facility]  CONSULT NOTE    Date: 10/17/2022                  Patient Name:  Anne Phillips  MRN: [id number]  DOB: 07-20-1951  Age / Sex: 72 y.o., female         PCP: Patient, No Pcp Per                 Service Requesting Consult: [place]                 Reason for Consult: Hyponatremia            History of Present Illness: Anne Phillips is a 72 y.o.  female with past medical history including hypertension, CAD, seizure disorder, breast cancer with mastectomy, hypothyroidism, who was admitted to [facility] on 10/10/2022 for Slurred speech [R47.81] Weakness [R53.1] CVA (cerebral vascular accident) [facility]) [I63.9]  Patient presents to the ED with slurred speech, ataxia and weakness. Code stroke called on arrival. Patient was found by family laying on the floor. She is seen today sitting up in bed, eating fruit from her breakfast tray. Patient states she has had a poor appetite, even prior to admission. Admits to drinking water to stay hydrated. Denies nausea or vomiting. Denies pain or discomfort. Room air with no lower extremity edema.   Sodium on ED arrival 129. Corrected to 135 during this admission, but is now 123. Diuretics held.    Medications: Outpatient medications: Medications Prior to Admission  Medication Sig Dispense Refill Last Dose   atorvastatin (LIPITOR) 40 MG tablet Take 40 mg by mouth daily.   10/10/2022   empagliflozin (JARDIANCE) 10 MG TABS tablet Take 10 mg by mouth daily.   10/10/2022   gabapentin (NEURONTIN) 300 MG capsule Take 1 capsule (300 mg total) by mouth 2 (two) times daily.   10/10/2022   Lacosamide (VIMPAT) 150 MG TABS Take 1 tablet (150 mg total) by mouth in the morning and at bedtime. 60 tablet 0 10/10/2022   levETIRAcetam (KEPPRA) 500 MG tablet Take 500 mg by mouth 3 (three) times daily.   10/10/2022   losartan (COZAAR) 100 MG tablet Take 100 mg by mouth daily.   10/10/2022   spironolactone (ALDACTONE) 25 MG tablet Take 12.5 mg by mouth  daily.   10/10/2022   vitamin B-12 1000 MCG tablet Take 1 tablet (1,000 mcg total) by mouth daily.   10/10/2022   acetaminophen (TYLENOL) 325 MG tablet Take 2 tablets (650 mg total) by mouth every 6 (six) hours as needed for mild pain (or Fever >/= 101).   prn at prn   carvedilol (COREG) 6.25 MG tablet Take 1 tablet (6.25 mg total) by mouth 2 (two) times daily with a meal. (Patient taking differently: Take 12.5 mg by mouth 2 (two) times daily with a meal.)      clonazePAM (KLONOPIN) 1 MG tablet Take 1 mg by mouth 2 (two) times daily as needed for anxiety.   prn at prn   furosemide (LASIX) 20 MG tablet Take 1 tablet (20 mg total) by mouth daily. 30 tablet 0 prn at prn   nitroGLYCERIN (NITROSTAT) 0.4 MG SL tablet Place under the tongue.   prn at prn   tacrolimus (PROTOPIC) 0.1 % ointment Apply 1 Application topically daily as needed.   prn at prn    Current medications: Current Facility-Administered Medications  Medication Dose Route Frequency Provider Last Rate Last Admin   acetaminophen (TYLENOL) tablet 650  mg  650 mg Oral Q6H PRN Mansy, Jan A, MD   650 mg at 10/17/22 0039   Or   acetaminophen (TYLENOL) suppository 650 mg  650 mg Rectal Q6H PRN Mansy, Arvella Merles, MD       aspirin EC tablet 81 mg  81 mg Oral Daily Kerney Elbe, MD   81 mg at 10/17/22 0912   atorvastatin (LIPITOR) tablet 40 mg  40 mg Oral Daily Mansy, Jan A, MD   40 mg at 10/17/22 0912   carvedilol (COREG) tablet 12.5 mg  12.5 mg Oral BID WC Mansy, Jan A, MD   12.5 mg at 10/16/22 1604   cephALEXin (KEFLEX) capsule 500 mg  500 mg Oral Q6H Max Sane, MD   500 mg at 10/17/22 1221   clonazePAM (KLONOPIN) tablet 1 mg  1 mg Oral BID PRN Mansy, Jan A, MD   1 mg at 10/16/22 1604   cyanocobalamin (VITAMIN B12) tablet 1,000 mcg  1,000 mcg Oral Daily Mansy, Jan A, MD   1,000 mcg at 10/17/22 0912   empagliflozin (JARDIANCE) tablet 10 mg  10 mg Oral Daily Mansy, Jan A, MD   10 mg at 10/17/22 0912   enoxaparin (LOVENOX) injection 40 mg  40 mg  Subcutaneous Q24H Mansy, Jan A, MD   40 mg at 10/17/22 0912   feeding supplement (ENSURE ENLIVE / ENSURE PLUS) liquid 237 mL  237 mL Oral BID BM Nicole Kindred A, DO   237 mL at 10/17/22 1224   gabapentin (NEURONTIN) capsule 300 mg  300 mg Oral BID Mansy, Jan A, MD   300 mg at 10/17/22 0912   haloperidol lactate (HALDOL) injection 1 mg  1 mg Intravenous Q6H PRN Nicole Kindred A, DO   1 mg at 10/12/22 1151   lacosamide (VIMPAT) tablet 150 mg  150 mg Oral BID Derek Jack, MD   150 mg at 10/17/22 0912   levETIRAcetam (KEPPRA) tablet 750 mg  750 mg Oral BID Kerney Elbe, MD   750 mg at 10/17/22 0912   loperamide (IMODIUM) capsule 4 mg  4 mg Oral PRN Max Sane, MD   4 mg at 10/15/22 1804   LORazepam (ATIVAN) injection 1 mg  1 mg Intravenous Q1H PRN Mansy, Jan A, MD       losartan (COZAAR) tablet 100 mg  100 mg Oral Daily Mansy, Jan A, MD   100 mg at 10/17/22 0912   magnesium hydroxide (MILK OF MAGNESIA) suspension 30 mL  30 mL Oral Daily PRN Mansy, Jan A, MD       multivitamin with minerals tablet 1 tablet  1 tablet Oral Daily Nicole Kindred A, DO   1 tablet at 10/17/22 0912   nicotine (NICODERM CQ - dosed in mg/24 hours) patch 14 mg  14 mg Transdermal Daily Nicole Kindred A, DO   14 mg at 10/16/22 1035   nitroGLYCERIN (NITROSTAT) SL tablet 0.4 mg  0.4 mg Sublingual Q5 min PRN Mansy, Jan A, MD       ondansetron [facility]) tablet 4 mg  4 mg Oral Q6H PRN Mansy, Jan A, MD       Or   ondansetron [facility]) injection 4 mg  4 mg Intravenous Q6H PRN Mansy, Jan A, MD       traZODone (DESYREL) tablet 25 mg  25 mg Oral QHS PRN Mansy, Jan A, MD   25 mg at 10/13/22 2031      Allergies: Allergies  Allergen Reactions   Iodinated Contrast Media  Hives and Other (See Comments)    Rash    Other Hives    Metal-reaction=rash Metal-reaction=rash       Past Medical History: Past Medical History:  Diagnosis Date   Cancer ([place])    Coronary artery disease    Hyperlipidemia    Hypertension     Seizures ([place])    Slurred speech 10/11/2022   Thyroid disease      Past Surgical History: Past Surgical History:  Procedure Laterality Date   MASTECTOMY Left      Family History: Family History  Problem Relation Age of Onset   Hypertension Other      Social History: Social History   Socioeconomic History   Marital status: Single    Spouse name: Not on file   Number of children: Not on file   Years of education: Not on file   Highest education level: Not on file  Occupational History   Not on file  Tobacco Use   Smoking status: Every Day    Packs/day: 1.00    Types: Cigarettes   Smokeless tobacco: Never  Vaping Use   Vaping Use: Never used  Substance and Sexual Activity   Alcohol use: No   Drug use: Never   Sexual activity: Not on file  Other Topics Concern   Not on file  Social History Narrative   Not on file   Social Determinants of Health   Financial Resource Strain: Not on file  Food Insecurity: No Food Insecurity (10/11/2022)   Hunger Vital Sign    Worried About Running Out of Food in the Last Year: Never true    Ran Out of Food in the Last Year: Never true  Transportation Needs: No Transportation Needs (10/11/2022)   PRAPARE - Hydrologist (Medical): No    Lack of Transportation (Non-Medical): No  Physical Activity: Not on file  Stress: Not on file  Social Connections: Not on file  Intimate Partner Violence: Not At Risk (10/11/2022)   Humiliation, Afraid, Rape, and Kick questionnaire    Fear of Current or Ex-Partner: No    Emotionally Abused: No    Physically Abused: No    Sexually Abused: No     Review of Systems: ROS  Vital Signs: Blood pressure 134/66, pulse (!) 57, temperature 99 F (37.2 C), resp. rate 20, height 5' 1"$  (1.549 m), weight 54.1 kg, SpO2 98 %.  Weight trends: Filed Weights   10/11/22 0027 10/15/22 0404  Weight: 60.4 kg 54.1 kg    Physical Exam: General: NAD  Head: Normocephalic,  atraumatic. Moist oral mucosal membranes  Eyes: Anicteric  Lungs:  Clear to auscultation, normal effort, room air  Heart: Regular rate and rhythm  Abdomen:  Soft, nontender  Extremities:  No peripheral edema.  Neurologic: Alert and oriented, moving all four extremities  Skin: No lesions  Access: No     Lab results: Basic Metabolic Panel: Recent Labs  Lab 10/12/22 0504 10/13/22 0621 10/14/22 0338 10/15/22 0436 10/16/22 0523 10/17/22 0330  NA 131* 131*   < > 131* 128* 123*  K 4.2 4.0   < > 4.2 4.4 4.3  CL 109 105   < > 105 103 97*  CO2 18* 20*   < > 19* 18* 18*  GLUCOSE 89 87   < > 90 88 97  BUN 15 19   < > 20 22 22  $ CREATININE 1.11* 1.14*   < > 1.16* 1.19* 1.25*  CALCIUM 8.6* 8.6*   < >  9.0 8.7* 8.3*  MG 1.7 1.8  --   --   --   --   PHOS 2.8  --   --   --   --   --    < > = values in this interval not displayed.    Liver Function Tests: Recent Labs  Lab 10/10/22 1906  AST 15  ALT 8  ALKPHOS 132*  BILITOT 0.5  PROT 7.7  ALBUMIN 3.9   No results for input(s): "LIPASE", "AMYLASE" in the last 168 hours. Recent Labs  Lab 10/10/22 2313  AMMONIA 20    CBC: Recent Labs  Lab 10/10/22 1906 10/11/22 0644 10/15/22 0436 10/16/22 0523 10/17/22 0330  WBC 6.5 5.3 5.4 5.9 6.1  NEUTROABS 5.0  --   --   --   --   HGB 10.9* 9.5* 9.6* 9.5* 9.5*  HCT 34.7* 28.9* 28.5* 28.5* 28.9*  MCV 90.4 87.8 85.6 86.1 85.8  PLT 179 150 172 183 195    Cardiac Enzymes: No results for input(s): "CKTOTAL", "CKMB", "CKMBINDEX", "TROPONINI" in the last 168 hours.  BNP: Invalid input(s): "POCBNP"  CBG: Recent Labs  Lab 10/10/22 1838  GLUCAP 63    Microbiology: Results for orders placed or performed during the hospital encounter of 10/10/22  Urine Culture     Status: Abnormal   Collection Time: 10/15/22 12:00 PM   Specimen: Urine, Random  Result Value Ref Range Status   Specimen Description   Final    URINE, RANDOM Performed at [facility], [address], [place], [place] [address]    Special Requests   Final    NONE Performed at [facility], [address] [place], [place] [address]    Culture MULTIPLE SPECIES PRESENT, SUGGEST RECOLLECTION (A)  Final   Report Status 10/16/2022 FINAL  Final    Coagulation Studies: No results for input(s): "LABPROT", "INR" in the last 72 hours.  Urinalysis: Recent Labs    10/15/22 1122  COLORURINE AMBER*  LABSPEC 1.024  PHURINE 5.0  GLUCOSEU 150*  HGBUR SMALL*  BILIRUBINUR NEGATIVE  KETONESUR NEGATIVE  PROTEINUR 30*  NITRITE NEGATIVE  LEUKOCYTESUR TRACE*      Imaging: EEG adult  Result Date: 10/16/2022 Derek Jack, MD     10/16/2022  2:15 PM Routine EEG Report Anne Phillips is a 72 y.o. female with a history of seizure who is undergoing an EEG to evaluate for seizures. Report: This EEG was acquired with electrodes placed according to the International 10-20 electrode system (including Fp1, Fp2, F3, F4, C3, C4, P3, P4, O1, O2, T3, T4, T5, T6, A1, A2, Fz, Cz, Pz). The following electrodes were missing or displaced: none. The occipital dominant rhythm was primarily composed of activity around 6-8 Hz although at times frequency would reach 8.5 Hz. This activity is reactive to stimulation. Drowsiness was manifested by background fragmentation; deeper stages of sleep were identified by K complexes and sleep spindles. There was no focal slowing. There were no interictal epileptiform discharges. There were no electrographic seizures identified. Photic stimulation and hyperventilation were not performed. Impression and clinical correlation: This EEG was obtained while awake and asleep and is abnormal due to mild diffuse slowing indicative of global cerebral dysfunction. Epileptiform abnormalities were not seen during this recording. This EEG is unchanged from that performed on 10/13/22. Su Monks, MD [facility] [phone] If 7pm- 7am, please page neurology on call as listed  in [place].   CT HEAD WO CONTRAST (5MM)  Result Date:  10/15/2022 CLINICAL DATA:  Mental status change, seizure disorder, history of breast cancer EXAM: CT HEAD WITHOUT CONTRAST TECHNIQUE: Contiguous axial images were obtained from the base of the skull through the vertex without intravenous contrast. RADIATION DOSE REDUCTION: This exam was performed according to the departmental dose-optimization program which includes automated exposure control, adjustment of the mA and/or kV according to patient size and/or use of iterative reconstruction technique. COMPARISON:  10/10/2022 FINDINGS: Brain: No evidence of acute infarction, hemorrhage, mass, mass effect, or midline shift. No hydrocephalus or extra-axial fluid collection. Redemonstrated remote infarct in the right parietal lobe. Remote right thalamic lacunar infarct. Periventricular white matter changes, likely the sequela of chronic small vessel ischemic disease. Vascular: No hyperdense vessel. Skull: Negative for fracture or focal lesion. Sinuses/Orbits: No acute finding. Other: The mastoid air cells are well aerated. IMPRESSION: No acute intracranial process. Electronically Signed   By: Merilyn Baba M.D.   On: 10/15/2022 19:41     Assessment & Plan: Anne Phillips is a 72 y.o.  female with past medical history including hypertension, CAD, seizure disorder, breast cancer with mastectomy, hypothyroidism, who was admitted to [facility] on 10/10/2022 for Slurred speech [R47.81] Weakness [R53.1] CVA (cerebral vascular accident) ([place]) [I63.9]  Hyponatremia likely secondary to poor oral take with increased free water. Sodium 129 on ED arrival, now 123. Will order Tolvaptan 31m once today. Will recheck sodium frequently. Recommended to hold discharge until sodium level around 130.   2. Acute metabolic acidosis, serum bicarb 18. Will monitor for now.   LOS: [place]2/16/202412:41 PM

## 2022-10-17 NOTE — Progress Notes (Signed)
Occupational Therapy Treatment Patient Details Name: Anne Phillips MRN: [id number] DOB: 18-Feb-1951 Today's Date: 10/17/2022   History of present illness Per chart: Anne Phillips is a 72 y.o. female with medical history significant for hypertension, dyslipidemia, coronary artery disease, seizure disorder breast cancer status postmastectomy tobacco use and hypothyroidism, who presented to the ER with 8 onset of fall.  The patient is been feeling generally weak and is not sure how she fell but she did not lose consciousness.  A family member was with her and left her in the kitchen and came later to find her on the floor.  She was having slurred speech then.  No paresthesias or focal muscle weakness.  She denies any head injuries or other injuries.  She thought her legs may give way.  She denies any seizures.   OT comments  [name] was alert and well oriented today and performed well. She was able to perform UB and LB dressing in sitting with Mod I, grooming in standing with CGA, transfers with SUPV. She ambulated to bathroom and was able to use toilet with SUPV. She does tend to leave her RW behind whenever she has to turn. Provided repeated cueing to keep walker in front of her at all times, but pt was unable to do so. She did have one LOB in standing. Pt has showed good improvement during her time here. Will keep DC recs for SNF at this time but will reassess during next OT session: if progress continues, pt may be able to DC home with [place].   Recommendations for follow up therapy are one component of a multi-disciplinary discharge planning process, led by the attending physician.  Recommendations may be updated based on patient status, additional functional criteria and insurance authorization.    Follow Up Recommendations  Skilled nursing-short term rehab (<3 hours/day)     Assistance Recommended at Discharge Intermittent Supervision/Assistance  Patient can return home with the  following  Assist for transportation;A little help with bathing/dressing/bathroom;A little help with walking and/or transfers;Assistance with cooking/housework;Help with stairs or ramp for entrance   Equipment Recommendations       Recommendations for Other Services      Precautions / Restrictions Precautions Precautions: Fall Restrictions Weight Bearing Restrictions: No       Mobility Bed Mobility               General bed mobility comments: pt received, left in recliner    Transfers Overall transfer level: Needs assistance Equipment used: Rolling walker (2 wheels) Transfers: Sit to/from Stand Sit to Stand: Min guard           General transfer comment: Even with cues pt steps away from walker for transfers.  No LOB noted.     Balance Overall balance assessment: Needs assistance Sitting-balance support: Feet unsupported Sitting balance-Leahy Scale: Good     Standing balance support: Bilateral upper extremity supported, During functional activity Standing balance-Leahy Scale: Fair Standing balance comment: One posterior LOB while ambulating; ongoing cueing re: safe use of RW                           ADL either performed or assessed with clinical judgement   ADL Overall ADL's : Needs assistance/impaired     Grooming: Min guard;Wash/dry hands;Standing               Lower Body Dressing: Modified independent;Sitting/lateral leans   Toilet Transfer: Regular Toilet;Rolling walker (2  wheels);Grab bars;Min guard Armed forces technical officer Details (indicate cue type and reason): pushes away RW upon entering bathroom Toileting- Clothing Manipulation and Hygiene: Sit to/from stand;Supervision/safety       Functional mobility during ADLs: Min guard;Rolling walker (2 wheels)      Extremity/Trunk Assessment Upper Extremity Assessment Upper Extremity Assessment: Generalized weakness   Lower Extremity Assessment Lower Extremity Assessment: Generalized  weakness        Vision       Perception     Praxis      Cognition Arousal/Alertness: Awake/alert Behavior During Therapy: WFL for tasks assessed/performed Overall Cognitive Status: Within Functional Limits for tasks assessed                                 General Comments: constant VC for safety awareness, reminders to use RW during mobility.        Exercises      Shoulder Instructions       General Comments      Pertinent Vitals/ Pain       Pain Assessment Pain Assessment: No/denies pain  Home Living                                          Prior Functioning/Environment              Frequency  Min 2X/week        Progress Toward Goals  OT Goals(current goals can now be found in the care plan section)  Progress towards OT goals: Progressing toward goals  Acute Rehab OT Goals OT Goal Formulation: With patient Time For Goal Achievement: 10/25/22 Potential to Achieve Goals: Good  Plan Discharge plan remains appropriate;Frequency remains appropriate    Co-evaluation                 AM-PAC OT "6 Clicks" Daily Activity     Outcome Measure   Help from another person eating meals?: None Help from another person taking care of personal grooming?: None Help from another person toileting, which includes using toliet, bedpan, or urinal?: A Little Help from another person bathing (including washing, rinsing, drying)?: A Little Help from another person to put on and taking off regular upper body clothing?: None Help from another person to put on and taking off regular lower body clothing?: A Little 6 Click Score: 21    End of Session Equipment Utilized During Treatment: Rolling walker (2 wheels)  OT Visit Diagnosis: Muscle weakness (generalized) (M62.81);History of falling (Z91.81)   Activity Tolerance Patient tolerated treatment well   Patient Left in chair;with call bell/phone within reach;with chair alarm  set   Nurse Communication          Time: 1044-1100 OT Time Calculation (min): 16 min  Charges: OT General Charges $OT Visit: 1 Visit OT Treatments $Self Care/Home Management : 8-22 mins Josiah Lobo, PhD, MS, OTR/L 10/17/22, 12:23 PM

## 2022-10-17 NOTE — Progress Notes (Signed)
[place] and gave report to [facility] LPN, [id number]

## 2022-10-17 NOTE — TOC Progression Note (Signed)
Transition of Care [facility]) - Progression Note    Patient Details  Name: Anne Phillips MRN: [id number] Date of Birth: 03-Oct-1950  Transition of Care [facility]) CM/SW Contact  Gerilyn Pilgrim, LCSW Phone Number: 10/17/2022, 11:10 AM  Clinical Narrative:  Pt sodium dropped and is unstable to discharge, sister notified on her voicemail and facility notified. Will need to start new insurance auth.        Expected Discharge Plan: [place] Barriers to Discharge: Continued Medical Work up  Expected Discharge Plan and Services     Post Acute Care Choice: [place] Living arrangements for the past 2 months: Single Family Home                                       Social Determinants of Health (SDOH) Interventions SDOH Screenings   Food Insecurity: No Food Insecurity (10/11/2022)  Housing: Low Risk  (10/11/2022)  Transportation Needs: No Transportation Needs (10/11/2022)  Utilities: Not At Risk (10/11/2022)  Tobacco Use: High Risk (10/14/2022)    Readmission Risk Interventions    10/12/2022   11:51 AM  Readmission Risk Prevention Plan  PCP or Specialist Appt within 3-5 Days Complete  Social Work Consult for [place] Planning/Counseling Complete  Palliative Care Screening Not Applicable

## 2022-10-17 NOTE — TOC Transition Note (Addendum)
Transition of Care [facility]) - CM/SW Discharge Note   Patient Details  Name: Anne Phillips MRN: [id number] Date of Birth: 03/18/1951  [facility]) CM/SW Contact:  Gerilyn Pilgrim, LCSW Phone Number: 10/17/2022, 2:01 PM   Clinical Narrative:  DC orders in. DC summary sent to facility, [facility]. Medical necessity sent to unit. RN given number for report. CSW called EMS. Pt is third on the list.   Final next level of care: Skilled Nursing Facility Barriers to Discharge: Barriers Resolved   Patient Goals and CMS Choice CMS Medicare.gov Compare Post Acute Care list provided to:: Patient    Discharge Placement                Patient chooses bed at: [facility] Patient to be transferred to facility by: [facility]      Discharge Plan and Services Additional resources added to the After Visit Summary for       Post Acute Care Choice: [place]                               Social Determinants of Health (SDOH) Interventions SDOH Screenings   Food Insecurity: No Food Insecurity (10/11/2022)  Housing: Low Risk  (10/11/2022)  Transportation Needs: No Transportation Needs (10/11/2022)  Utilities: Not At Risk (10/11/2022)  Tobacco Use: High Risk (10/14/2022)     Readmission Risk Interventions    10/12/2022   11:51 AM  Readmission Risk Prevention Plan  PCP or Specialist Appt within 3-5 Days Complete  Social Work Consult for [place] Planning/Counseling Complete  Palliative Care Screening Not Applicable

## 2022-10-17 NOTE — Consult Note (Signed)
PHARMACY CONSULT NOTE - Tolvaptan  Pharmacy Consult for Tolvaptan Monitoring  Recent Labs: Potassium (mmol/L)  Date Value  10/17/2022 4.3   Magnesium (mg/dL)  Date Value  10/13/2022 1.8   Calcium (mg/dL)  Date Value  10/17/2022 8.3 (L)   Albumin (g/dL)  Date Value  10/10/2022 3.9   Phosphorus (mg/dL)  Date Value  10/12/2022 2.8   Sodium (mmol/L)  Date Value  10/17/2022 123 (L)    Assessment  Anne Phillips is a 72 y.o. female presenting s/p fall. PMH significant for hypertension, dyslipidemia, coronary artery disease, seizure disorder breast cancer status postmastectomy tobacco use and hypothyroidism.  Patient found to have serum Na of 129 on admission, but has trended down to 123. Pharmacy has been consulted to monitor tolvaptan.  Pertinent medications: lasix 20m daily(currently held) Drug interactions: none  Goal of Therapy:  Na >=130 Do not exceed increase in Na by 8 mEq/L per 8 hours or 12 mEq/L in 24 hours  Plan:  Give tolvaptan 15 mg x1 Check Na Q2H x8h for 2 occurrences then daily thereafter Continue to monitor for signs of clinical improvement and recommendations per nephrology  Anne Phillips PharmD Clinical Pharmacist 10/17/2022 1:01 PM

## 2022-10-17 NOTE — Discharge Summary (Signed)
Physician Discharge Summary   Patient: Anne Phillips MRN: [id number] DOB: 1951/08/18  Admit date:     10/10/2022  Discharge date: 10/17/22  Discharge Physician: Max Sane   PCP: Patient, No Pcp Per   Recommendations at discharge:   Follow-up with outpatient providers as requested  Discharge Diagnoses: Principal Problem:   Transient neurological symptoms Active Problems:   Hyponatremia   Seizure disorder (HCC)   Hypertension   Dyslipidemia   Coronary artery disease   Chronic diastolic CHF (congestive heart failure) (HCC)   TIA (transient ischemic attack)   Weakness   Chronic kidney disease, stage 3b (HCC)   Delirium, acute  Resolved Problems:   Slurred speech  Hospital Course: Anne Phillips is a 72 y.o. female with medical history significant for hypertension, dyslipidemia, coronary artery disease, seizure disorder breast cancer status postmastectomy tobacco use and hypothyroidism, who presented to the ER with 8 onset of fall.  The patient is been feeling generally weak and is not sure how she fell but she did not lose consciousness.  A family member was with her and left her in the kitchen and came later to find her on the floor.  She was having slurred speech then.  No paresthesias or focal muscle weakness.  She denies any head injuries or other injuries.  She thought her legs may give way.  She denies any seizures.  No urinary or stool incontinence or tongue bites. No fever/chills or infectious symptoms.  ED evaluation revealed hyponatremia with sodium 129, bicarb 19, BUN 19, Cr 1.23 (baseline), normal lactic acid.  CBC with mild anemia and no leukocytosis.  She was afebrile with elevated BP 164/76, other vitals normal.   Due to report of slurred speech and diffuse weakness, neurology was consulted and stroke work-up was initiated.  CT head w/o contrast no acute findings, showed chronic small vessel disease.    MRI brain was negative for acute stroke, showed mild atrophy  seen in advanced age, remote lacunar infarcts, and chronic small vessel disease.  Patient admitted to the hospital.  Neurology was consulted.  Breakthrough seizure with postictal state versus unwitnessed syncopal episode were felt most likely etiology of her presentation.  Stroke/TIA felt much less likely.  2/14: Concern for worsening mental status/confusion/agitation per nursing.  Neurology reevaluation and repeating EEG, checking UA 2/15: UA concerning for UTI, started Keflex.  Neurology increased Vimpat to 200 mg twice daily, sodium 128, nephrology consult  Assessment and Plan: * Transient neurological symptoms Slurred speech and Generalized Weakness Stroke ruled out - MRI negative.  TIA also felt less likely given presentation of diffuse generalized weakness. --Neurology recommends to continue Keppra and Vimpat --likely Breakthrough seizure -UA concerning for UTI so treated with Keflex --Seen by PT, OT, SLP / SNF recommended at d/c --Per neurology, no need for vascular imaging given no acute stroke on MRI brain and vessel imaging done 14 months ago. --Continue daily aspirin, Lipitor --EEG negative for seizure activity, showed diffuse slowing  Hyponatremia Presented with sodium 129, improved at 133 now.  1 dose of tolvaptan given  Seizure disorder ([place]) Breakthrough seizure felt to be high on the differential with presenting symptoms of postictal state. -- Continue Keppra 750 mg p.o. twice daily and Vimpat 150 mg p.o. twice daily --EEG 2/12 showed diffuse slowing without epileptiform abnormality  -Repeat EEG on 2/14 due to change in her mental status with potential concern for unwitnessed seizure,  UA concerning for UTI so started on Keflex --Follow up with outpatient neurologist  at discharge  Delirium, acute Likely hospital delirium in elderly patient with ?underlying mild cognitive impairment, unknown if dementia history. Much better now and at her baseline  Chronic kidney  disease, stage 3b ([place]) At baseline  Weakness PT/OT - SNF recommended.  TIA (transient ischemic attack) Cannot be definitively ruled out, but felt to be less likely etiology, as outlined.     See Transient Neurologic Symptoms  Chronic diastolic CHF (congestive heart failure) ([place]) Per chart review - Echo Sept 2023 with EF 55-60%, grade II diastolic dysfunction. Well compensated  Coronary artery disease Chronic, stable, no chest pain. Continue aspirin Coreg and statin  Dyslipidemia Continue Lipitor.  Hypertension Continue home medications.       Consultants: Neurology, nephrology Disposition: Skilled nursing facility Diet recommendation:  Discharge Diet Orders (From admission, onward)     Start     Ordered   10/17/22 0000  Diet - low sodium heart healthy        10/17/22 1344           Carb modified diet DISCHARGE MEDICATION: Allergies as of 10/17/2022       Reactions   Iodinated Contrast Media Hives, Other (See Comments)   Rash    Other Hives   Metal-reaction=rash Metal-reaction=rash        Medication List     STOP taking these medications    clonazePAM 1 MG tablet Commonly known as: KLONOPIN   spironolactone 25 MG tablet Commonly known as: ALDACTONE       TAKE these medications    acetaminophen 325 MG tablet Commonly known as: TYLENOL Take 2 tablets (650 mg total) by mouth every 6 (six) hours as needed for mild pain (or Fever >/= 101).   aspirin EC 81 MG tablet Take 1 tablet (81 mg total) by mouth daily. Swallow whole. Start taking on: October 18, 2022   atorvastatin 40 MG tablet Commonly known as: LIPITOR Take 40 mg by mouth daily.   carvedilol 6.25 MG tablet Commonly known as: COREG Take 1 tablet (6.25 mg total) by mouth 2 (two) times daily with a meal. What changed: how much to take   cyanocobalamin 1000 MCG tablet Take 1 tablet (1,000 mcg total) by mouth daily.   empagliflozin 10 MG Tabs tablet Commonly known as:  JARDIANCE Take 10 mg by mouth daily.   feeding supplement Liqd Take 237 mLs by mouth 2 (two) times daily between meals.   furosemide 20 MG tablet Commonly known as: LASIX Take 1 tablet (20 mg total) by mouth daily.   gabapentin 300 MG capsule Commonly known as: NEURONTIN Take 1 capsule (300 mg total) by mouth 2 (two) times daily.   Lacosamide 150 MG Tabs Commonly known as: Vimpat Take 1 tablet (150 mg total) by mouth in the morning and at bedtime.   levETIRAcetam 750 MG tablet Commonly known as: KEPPRA Take 1 tablet (750 mg total) by mouth 2 (two) times daily. What changed:  medication strength how much to take when to take this   losartan 100 MG tablet Commonly known as: COZAAR Take 100 mg by mouth daily.   multivitamin with minerals Tabs tablet Take 1 tablet by mouth daily. Start taking on: October 18, 2022   nicotine 14 mg/24hr patch Commonly known as: NICODERM CQ - dosed in mg/24 hours Place 1 patch (14 mg total) onto the skin daily. Start taking on: October 18, 2022   nitroGLYCERIN 0.4 MG SL tablet Commonly known as: NITROSTAT Place under the tongue.   tacrolimus  0.1 % ointment Commonly known as: PROTOPIC Apply 1 Application topically daily as needed.        Discharge Exam: Filed Weights   10/11/22 0027 10/15/22 0404  Weight: 60.4 kg 54.1 kg   General exam: 72 year old female sitting in the chair comfortably without any acute distress Respiratory system: lungs clear, normal respiratory effort on room air. Cardiovascular system: normal S1/S2, RRR, no pedal edema.   Gastrointestinal system: soft, NT, ND, no HSM felt, +bowel sounds. Central nervous system: Alert and awake, grossly non-focal. Extremities: no edema, normal tone Skin: dry, intact, normal temperature Psychiatry: Normal mood and affect  Condition at discharge: fair  The results of significant diagnostics from this hospitalization (including imaging, microbiology, ancillary and  laboratory) are listed below for reference.   Imaging Studies: EEG adult  Result Date: 2022-11-06 Derek Jack, MD     06-Nov-2022  2:15 PM Routine EEG Report Anne Phillips is a 72 y.o. female with a history of seizure who is undergoing an EEG to evaluate for seizures. Report: This EEG was acquired with electrodes placed according to the International 10-20 electrode system (including Fp1, Fp2, F3, F4, C3, C4, P3, P4, O1, O2, T3, T4, T5, T6, A1, A2, Fz, Cz, Pz). The following electrodes were missing or displaced: none. The occipital dominant rhythm was primarily composed of activity around 6-8 Hz although at times frequency would reach 8.5 Hz. This activity is reactive to stimulation. Drowsiness was manifested by background fragmentation; deeper stages of sleep were identified by K complexes and sleep spindles. There was no focal slowing. There were no interictal epileptiform discharges. There were no electrographic seizures identified. Photic stimulation and hyperventilation were not performed. Impression and clinical correlation: This EEG was obtained while awake and asleep and is abnormal due to mild diffuse slowing indicative of global cerebral dysfunction. Epileptiform abnormalities were not seen during this recording. This EEG is unchanged from that performed on 10/13/22. Su Monks, MD [facility] [phone] If 7pm- 7am, please page neurology on call as listed in [place].   CT HEAD WO CONTRAST (5MM)  Result Date: 10/15/2022 CLINICAL DATA:  Mental status change, seizure disorder, history of breast cancer EXAM: CT HEAD WITHOUT CONTRAST TECHNIQUE: Contiguous axial images were obtained from the base of the skull through the vertex without intravenous contrast. RADIATION DOSE REDUCTION: This exam was performed according to the departmental dose-optimization program which includes automated exposure control, adjustment of the mA and/or kV according to patient size and/or use of iterative  reconstruction technique. COMPARISON:  10/10/2022 FINDINGS: Brain: No evidence of acute infarction, hemorrhage, mass, mass effect, or midline shift. No hydrocephalus or extra-axial fluid collection. Redemonstrated remote infarct in the right parietal lobe. Remote right thalamic lacunar infarct. Periventricular white matter changes, likely the sequela of chronic small vessel ischemic disease. Vascular: No hyperdense vessel. Skull: Negative for fracture or focal lesion. Sinuses/Orbits: No acute finding. Other: The mastoid air cells are well aerated. IMPRESSION: No acute intracranial process. Electronically Signed   By: Merilyn Baba M.D.   On: 10/15/2022 19:41   EEG adult  Result Date: 10/13/2022 Derek Jack, MD     10/13/2022  7:40 PM Routine EEG Report Anne Phillips is a 72 y.o. female with a history of seizure who is undergoing an EEG to evaluate for seizures. Report: This EEG was acquired with electrodes placed according to the International 10-20 electrode system (including Fp1, Fp2, F3, F4, C3, C4, P3, P4, O1, O2, T3, T4, T5, T6, A1, A2, Fz, Cz,  Pz). The following electrodes were missing or displaced: none. The occipital dominant rhythm was primarily composed of activity around 6-8 Hz although at times frequency would reach 8.5 Hz. This activity is reactive to stimulation. Drowsiness was manifested by background fragmentation; deeper stages of sleep were identified by K complexes and sleep spindles. There was no focal slowing. There were no interictal epileptiform discharges. There were no electrographic seizures identified. Photic stimulation and hyperventilation were not performed. Impression and clinical correlation: This EEG was obtained while awake and asleep and is abnormal due to mild diffuse slowing indicative of global cerebral dysfunction. Epileptiform abnormalities were not seen during this recording. Su Monks, MD [facility] [phone] If 7pm- 7am, please page neurology  on call as listed in [place].   MR BRAIN WO CONTRAST  Result Date: 10/10/2022 CLINICAL DATA:  Initial evaluation for neuro deficit, stroke suspected. EXAM: MRI HEAD WITHOUT CONTRAST TECHNIQUE: Multiplanar, multiecho pulse sequences of the brain and surrounding structures were obtained without intravenous contrast. COMPARISON:  Prior CT from earlier the same day. FINDINGS: Brain: Examination mildly degraded by motion artifact. Mild age-related cerebral atrophy with chronic small vessel ischemic disease. Few small remote lacunar infarcts noted about the thalami. No abnormal foci of restricted diffusion to suggest acute or subacute ischemia. Gray-white matter differentiation maintained. No areas of chronic cortical infarction. No acute intracranial hemorrhage. Single chronic microhemorrhage noted at the left occipital region, of doubtful significance in isolation. No mass lesion, midline shift or mass effect. No hydrocephalus or extra-axial fluid collection. Pituitary gland and suprasellar region within normal limits. Vascular: Major intracranial vascular flow voids are maintained. Skull and upper cervical spine: Craniocervical junction within normal limits. Bone marrow signal intensity diffusely heterogeneous without worrisome osseous lesion. No scalp soft tissue abnormality. Sinuses/Orbits: Globes and orbital soft tissues within normal limits. Paranasal sinuses are largely clear. No significant mastoid effusion. Other: None. IMPRESSION: 1. No acute intracranial abnormality. 2. Mild age-related cerebral atrophy with chronic small vessel ischemic disease, with a few small remote lacunar infarcts about the thalami. Electronically Signed   By: Jeannine Boga M.D.   On: 10/10/2022 22:51   CT HEAD CODE STROKE WO CONTRAST  Result Date: 10/10/2022 CLINICAL DATA:  Code stroke.  Slurred speech and facial droop. EXAM: CT HEAD WITHOUT CONTRAST TECHNIQUE: Contiguous axial images were obtained from the base of the skull  through the vertex without intravenous contrast. RADIATION DOSE REDUCTION: This exam was performed according to the departmental dose-optimization program which includes automated exposure control, adjustment of the mA and/or kV according to patient size and/or use of iterative reconstruction technique. COMPARISON:  Head CT 10/25/2021 and MRI 08/02/2021 FINDINGS: Brain: There is no evidence of an acute infarct, intracranial hemorrhage, mass, midline shift, or extra-axial fluid collection. A small chronic cortical/subcortical infarct in the right parietal lobe is unchanged. Hypodensities elsewhere in the cerebral white matter bilaterally are also unchanged and nonspecific but compatible with mild chronic small vessel ischemic disease. There is a chronic lacunar infarct in the right thalamus. Mild cerebral atrophy is within normal limits for age. Vascular: Calcified atherosclerosis at the skull base. No hyperdense vessel. Skull: No acute fracture or suspicious osseous lesion. Sinuses/Orbits: Visualized paranasal sinuses and mastoid air cells are clear. Unremarkable orbits. Other: None. ASPECTS [facility] Stroke Program Early CT Score) - Ganglionic level infarction (caudate, lentiform nuclei, internal capsule, insula, M1-M3 cortex): 7 - Supraganglionic infarction (M4-M6 cortex): 3 Total score (0-10 with 10 being normal): 10 IMPRESSION: 1. No evidence of acute intracranial abnormality. ASPECTS of 10.  2. Mild chronic small vessel ischemic disease. These results were communicated to Dr. Cheral Marker at 7:07 pm on 10/10/2022 by text page via the [facility] messaging system. Electronically Signed   By: Logan Bores M.D.   On: 10/10/2022 19:07    Microbiology: Results for orders placed or performed during the hospital encounter of 10/10/22  Urine Culture     Status: Abnormal   Collection Time: 10/15/22 12:00 PM   Specimen: Urine, Random  Result Value Ref Range Status   Specimen Description   Final    URINE, RANDOM Performed at  [facility], [address] [place], [place] [address]    Special Requests   Final    NONE Performed at [facility], [address] [place], [place] [address]    Culture MULTIPLE SPECIES PRESENT, SUGGEST RECOLLECTION (A)  Final   Report Status 10/16/2022 FINAL  Final    Labs: CBC: Recent Labs  Lab 10/10/22 1906 10/11/22 0644 10/15/22 0436 10/16/22 0523 10/17/22 0330  WBC 6.5 5.3 5.4 5.9 6.1  NEUTROABS 5.0  --   --   --   --   HGB 10.9* 9.5* 9.6* 9.5* 9.5*  HCT 34.7* 28.9* 28.5* 28.5* 28.9*  MCV 90.4 87.8 85.6 86.1 85.8  PLT 179 150 172 183 0000000   Basic Metabolic Panel: Recent Labs  Lab 10/12/22 0504 10/13/22 0621 10/14/22 0338 10/15/22 0436 10/16/22 0523 10/17/22 0330 10/17/22 1142  NA 131* 131* 131* 131* 128* 123* 129*  K 4.2 4.0 4.5 4.2 4.4 4.3 4.1  CL 109 105 104 105 103 97* 101  CO2 18* 20* 18* 19* 18* 18* 21*  GLUCOSE 89 87 77 90 88 97 85  BUN 15 19 19 20 22 22 22  $ CREATININE 1.11* 1.14* 1.18* 1.16* 1.19* 1.25* 1.21*  CALCIUM 8.6* 8.6* 9.1 9.0 8.7* 8.3* 9.0  MG 1.7 1.8  --   --   --   --   --   PHOS 2.8  --   --   --   --   --   --    Liver Function Tests: Recent Labs  Lab 10/10/22 1906 10/17/22 1142  AST 15 21  ALT 8 13  ALKPHOS 132* 134*  BILITOT 0.5 0.5  PROT 7.7 7.6  ALBUMIN 3.9 3.7   CBG: Recent Labs  Lab 10/10/22 1838  GLUCAP 96    Discharge time spent: greater than 30 minutes.  Signed: Max Sane, MD [facility] 10/17/2022

## 2022-10-17 NOTE — Progress Notes (Signed)
Mobility Specialist - Progress Note   10/17/22 1000  Mobility  Activity Ambulated with assistance in hallway;Transferred from bed to chair  Level of Assistance Standby assist, set-up cues, supervision of patient - no hands on  Assistive Device Front wheel walker;None  Distance Ambulated (ft) 200 ft  Activity Response Tolerated well  $Mobility charge 1 Mobility     Pt lying in bed upon arrival, utilizing RA. Pt completed bed mobility and STS modI; ambulation with supervision. Mild sway but no LOB. Upon return to room, pt does push RW aside to ambulate to chair. No complaints. Pt left in recliner with alarm set, needs in reach.    Kathee Delton Mobility Specialist 10/17/22, 10:04 AM

## 2022-10-20 ENCOUNTER — Ambulatory Visit
Admit: 2022-10-20 | Payer: MEDICARE | Attending: Student in an Organized Health Care Education/Training Program | Primary: Student in an Organized Health Care Education/Training Program

## 2022-10-28 ENCOUNTER — Other Ambulatory Visit: Payer: Self-pay

## 2022-10-28 ENCOUNTER — Emergency Department: Payer: Medicare HMO

## 2022-10-28 ENCOUNTER — Encounter: Payer: Self-pay | Admitting: Internal Medicine

## 2022-10-28 ENCOUNTER — Inpatient Hospital Stay
Admission: EM | Admit: 2022-10-28 | Discharge: 2022-11-06 | DRG: 643 | Disposition: A | Discharge status: Home Health | Payer: Medicare HMO | Source: Skilled Nursing Facility | Attending: Student | Admitting: Student

## 2022-10-28 DIAGNOSIS — E871 Hypo-osmolality and hyponatremia: Secondary | ICD-10-CM | POA: Diagnosis not present

## 2022-10-28 DIAGNOSIS — I2510 Atherosclerotic heart disease of native coronary artery without angina pectoris: Secondary | ICD-10-CM | POA: Diagnosis present

## 2022-10-28 DIAGNOSIS — E222 Syndrome of inappropriate secretion of antidiuretic hormone: Principal | ICD-10-CM | POA: Diagnosis present

## 2022-10-28 DIAGNOSIS — R81 Glycosuria: Secondary | ICD-10-CM | POA: Diagnosis present

## 2022-10-28 DIAGNOSIS — Z66 Do not resuscitate: Secondary | ICD-10-CM | POA: Diagnosis present

## 2022-10-28 DIAGNOSIS — N1832 Chronic kidney disease, stage 3b: Secondary | ICD-10-CM | POA: Diagnosis present

## 2022-10-28 DIAGNOSIS — Z91041 Radiographic dye allergy status: Secondary | ICD-10-CM

## 2022-10-28 DIAGNOSIS — K529 Noninfective gastroenteritis and colitis, unspecified: Secondary | ICD-10-CM | POA: Diagnosis present

## 2022-10-28 DIAGNOSIS — E039 Hypothyroidism, unspecified: Secondary | ICD-10-CM | POA: Diagnosis present

## 2022-10-28 DIAGNOSIS — Z7982 Long term (current) use of aspirin: Secondary | ICD-10-CM

## 2022-10-28 DIAGNOSIS — G9340 Encephalopathy, unspecified: Secondary | ICD-10-CM

## 2022-10-28 DIAGNOSIS — I5030 Unspecified diastolic (congestive) heart failure: Secondary | ICD-10-CM | POA: Diagnosis present

## 2022-10-28 DIAGNOSIS — S42031A Displaced fracture of lateral end of right clavicle, initial encounter for closed fracture: Secondary | ICD-10-CM | POA: Diagnosis present

## 2022-10-28 DIAGNOSIS — Z888 Allergy status to other drugs, medicaments and biological substances status: Secondary | ICD-10-CM

## 2022-10-28 DIAGNOSIS — I130 Hypertensive heart and chronic kidney disease with heart failure and stage 1 through stage 4 chronic kidney disease, or unspecified chronic kidney disease: Secondary | ICD-10-CM | POA: Diagnosis present

## 2022-10-28 DIAGNOSIS — G40909 Epilepsy, unspecified, not intractable, without status epilepticus: Secondary | ICD-10-CM

## 2022-10-28 DIAGNOSIS — Z9012 Acquired absence of left breast and nipple: Secondary | ICD-10-CM

## 2022-10-28 DIAGNOSIS — F17210 Nicotine dependence, cigarettes, uncomplicated: Secondary | ICD-10-CM | POA: Diagnosis present

## 2022-10-28 DIAGNOSIS — E049 Nontoxic goiter, unspecified: Secondary | ICD-10-CM

## 2022-10-28 DIAGNOSIS — Z7984 Long term (current) use of oral hypoglycemic drugs: Secondary | ICD-10-CM

## 2022-10-28 DIAGNOSIS — Z8249 Family history of ischemic heart disease and other diseases of the circulatory system: Secondary | ICD-10-CM

## 2022-10-28 DIAGNOSIS — G9341 Metabolic encephalopathy: Secondary | ICD-10-CM | POA: Diagnosis present

## 2022-10-28 DIAGNOSIS — E0520 Thyrotoxicosis with toxic multinodular goiter without thyrotoxic crisis or storm: Secondary | ICD-10-CM | POA: Diagnosis present

## 2022-10-28 DIAGNOSIS — S0083XA Contusion of other part of head, initial encounter: Secondary | ICD-10-CM | POA: Diagnosis present

## 2022-10-28 DIAGNOSIS — E785 Hyperlipidemia, unspecified: Secondary | ICD-10-CM | POA: Diagnosis present

## 2022-10-28 DIAGNOSIS — I5032 Chronic diastolic (congestive) heart failure: Secondary | ICD-10-CM | POA: Diagnosis present

## 2022-10-28 DIAGNOSIS — D631 Anemia in chronic kidney disease: Secondary | ICD-10-CM | POA: Diagnosis present

## 2022-10-28 DIAGNOSIS — W19XXXA Unspecified fall, initial encounter: Secondary | ICD-10-CM

## 2022-10-28 DIAGNOSIS — I440 Atrioventricular block, first degree: Secondary | ICD-10-CM | POA: Diagnosis present

## 2022-10-28 DIAGNOSIS — Z79899 Other long term (current) drug therapy: Secondary | ICD-10-CM

## 2022-10-28 DIAGNOSIS — I10 Essential (primary) hypertension: Secondary | ICD-10-CM | POA: Diagnosis present

## 2022-10-28 DIAGNOSIS — Z9861 Coronary angioplasty status: Secondary | ICD-10-CM

## 2022-10-28 DIAGNOSIS — G40109 Localization-related (focal) (partial) symptomatic epilepsy and epileptic syndromes with simple partial seizures, not intractable, without status epilepticus: Secondary | ICD-10-CM | POA: Diagnosis present

## 2022-10-28 DIAGNOSIS — E639 Nutritional deficiency, unspecified: Secondary | ICD-10-CM | POA: Diagnosis present

## 2022-10-28 DIAGNOSIS — E861 Hypovolemia: Secondary | ICD-10-CM | POA: Diagnosis present

## 2022-10-28 DIAGNOSIS — Z853 Personal history of malignant neoplasm of breast: Secondary | ICD-10-CM

## 2022-10-28 DIAGNOSIS — Y92129 Unspecified place in nursing home as the place of occurrence of the external cause: Secondary | ICD-10-CM

## 2022-10-28 LAB — URINALYSIS, ROUTINE W REFLEX MICROSCOPIC
Bilirubin Urine: NEGATIVE
Glucose, UA: 150 mg/dL — AB
Hgb urine dipstick: NEGATIVE
Ketones, ur: NEGATIVE mg/dL
Nitrite: NEGATIVE
Protein, ur: NEGATIVE mg/dL
Specific Gravity, Urine: 1.011 (ref 1.005–1.030)
pH: 5 (ref 5.0–8.0)

## 2022-10-28 LAB — COMPREHENSIVE METABOLIC PANEL
ALT: 13 U/L (ref 0–44)
AST: 39 U/L (ref 15–41)
Albumin: 4.2 g/dL (ref 3.5–5.0)
Alkaline Phosphatase: 137 U/L — ABNORMAL HIGH (ref 38–126)
Anion gap: 12 (ref 5–15)
BUN: 18 mg/dL (ref 8–23)
CO2: 23 mmol/L (ref 22–32)
Calcium: 9.2 mg/dL (ref 8.9–10.3)
Chloride: 87 mmol/L — ABNORMAL LOW (ref 98–111)
Creatinine, Ser: 1.25 mg/dL — ABNORMAL HIGH (ref 0.44–1.00)
GFR, Estimated: 46 mL/min — ABNORMAL LOW (ref >60)
Glucose, Bld: 110 mg/dL — ABNORMAL HIGH (ref 70–99)
Potassium: 4.2 mmol/L (ref 3.5–5.1)
Sodium: 122 mmol/L — ABNORMAL LOW (ref 135–145)
Total Bilirubin: 1.4 mg/dL — ABNORMAL HIGH (ref 0.3–1.2)
Total Protein: 8.1 g/dL (ref 6.5–8.1)

## 2022-10-28 LAB — CBC WITH DIFFERENTIAL/PLATELET
Abs Immature Granulocytes: 0.02 10*3/uL (ref 0.00–0.07)
Basophils Absolute: 0 10*3/uL (ref 0.0–0.1)
Basophils Relative: 1 %
Eosinophils Absolute: 0.1 10*3/uL (ref 0.0–0.5)
Eosinophils Relative: 2 %
HCT: 30.2 % — ABNORMAL LOW (ref 36.0–46.0)
Hemoglobin: 10 g/dL — ABNORMAL LOW (ref 12.0–15.0)
Immature Granulocytes: 0 %
Lymphocytes Relative: 6 %
Lymphs Abs: 0.4 10*3/uL — ABNORMAL LOW (ref 0.7–4.0)
MCH: 28.2 pg (ref 26.0–34.0)
MCHC: 33.1 g/dL (ref 30.0–36.0)
MCV: 85.3 fL (ref 80.0–100.0)
Monocytes Absolute: 0.6 10*3/uL (ref 0.1–1.0)
Monocytes Relative: 8 %
Neutro Abs: 5.9 10*3/uL (ref 1.7–7.7)
Neutrophils Relative %: 83 %
Platelets: 223 10*3/uL (ref 150–400)
RBC: 3.54 MIL/uL — ABNORMAL LOW (ref 3.87–5.11)
RDW: 13.5 % (ref 11.5–15.5)
WBC: 7.1 10*3/uL (ref 4.0–10.5)
nRBC: 0 % (ref 0.0–0.2)

## 2022-10-28 LAB — BASIC METABOLIC PANEL
Anion gap: 12 (ref 5–15)
BUN: 17 mg/dL (ref 8–23)
CO2: 23 mmol/L (ref 22–32)
Calcium: 8.9 mg/dL (ref 8.9–10.3)
Chloride: 89 mmol/L — ABNORMAL LOW (ref 98–111)
Creatinine, Ser: 1.23 mg/dL — ABNORMAL HIGH (ref 0.44–1.00)
GFR, Estimated: 47 mL/min — ABNORMAL LOW (ref >60)
Glucose, Bld: 110 mg/dL — ABNORMAL HIGH (ref 70–99)
Potassium: 4.1 mmol/L (ref 3.5–5.1)
Sodium: 124 mmol/L — ABNORMAL LOW (ref 135–145)

## 2022-10-28 LAB — SODIUM, URINE, RANDOM
Sodium, Ur: 20 mmol/L
Sodium, Ur: <10 mmol/L

## 2022-10-28 LAB — OSMOLALITY, URINE
Osmolality, Ur: 291 mOsm/kg — ABNORMAL LOW (ref 300–900)
Osmolality, Ur: 311 mOsm/kg (ref 300–900)

## 2022-10-28 LAB — OSMOLALITY: Osmolality: 262 mOsm/kg — ABNORMAL LOW (ref 275–295)

## 2022-10-28 LAB — MRSA NEXT GEN BY PCR, NASAL: MRSA by PCR Next Gen: NOT DETECTED

## 2022-10-28 LAB — CHLORIDE, URINE, RANDOM: Chloride Urine: 30 mmol/L

## 2022-10-28 MED ORDER — LEVETIRACETAM 750 MG PO TABS
750.0000 mg | ORAL_TABLET | Freq: Two times a day (BID) | ORAL | Status: DC
  Administered 2022-10-28 – 2022-11-05 (×16): 750 mg via ORAL
  Filled 2022-10-28 (×16): qty 1

## 2022-10-28 MED ORDER — SODIUM CHLORIDE 0.9% FLUSH
3.0000 mL | Freq: Two times a day (BID) | INTRAVENOUS | Status: DC
  Administered 2022-10-28: 3 mL via INTRAVENOUS

## 2022-10-28 MED ORDER — LOSARTAN POTASSIUM 50 MG PO TABS
100.0000 mg | ORAL_TABLET | Freq: Every day | ORAL | Status: DC
  Administered 2022-10-29 – 2022-11-06 (×9): 100 mg via ORAL
  Filled 2022-10-28 (×9): qty 2

## 2022-10-28 MED ORDER — SODIUM CHLORIDE 0.9 % IV BOLUS
500.0000 mL | Freq: Once | INTRAVENOUS | Status: AC
  Administered 2022-10-28: 500 mL via INTRAVENOUS

## 2022-10-28 MED ORDER — CARVEDILOL 3.125 MG PO TABS
6.2500 mg | ORAL_TABLET | Freq: Two times a day (BID) | ORAL | Status: DC
  Administered 2022-10-29 – 2022-11-04 (×13): 6.25 mg via ORAL
  Filled 2022-10-28 (×11): qty 2

## 2022-10-28 MED ORDER — ACETAMINOPHEN 325 MG PO TABS
650.0000 mg | ORAL_TABLET | Freq: Four times a day (QID) | ORAL | Status: DC | PRN
  Administered 2022-10-30 – 2022-11-06 (×11): 650 mg via ORAL
  Filled 2022-10-28 (×14): qty 2

## 2022-10-28 MED ORDER — OXYCODONE HCL 5 MG PO TABS
5.0000 mg | ORAL_TABLET | Freq: Four times a day (QID) | ORAL | Status: DC | PRN
  Administered 2022-10-29 – 2022-11-04 (×8): 5 mg via ORAL
  Filled 2022-10-28 (×8): qty 1

## 2022-10-28 MED ORDER — ONDANSETRON HCL 4 MG/2ML IJ SOLN: 4.0000 mg | Freq: Four times a day (QID) | INTRAMUSCULAR | Status: DC | PRN

## 2022-10-28 MED ORDER — ONDANSETRON HCL 4 MG PO TABS: 4.0000 mg | ORAL_TABLET | Freq: Four times a day (QID) | ORAL | Status: DC | PRN

## 2022-10-28 MED ORDER — ACETAMINOPHEN 650 MG RE SUPP: 650.0000 mg | Freq: Four times a day (QID) | RECTAL | Status: DC | PRN

## 2022-10-28 MED ORDER — ASPIRIN 81 MG PO TBEC
81.0000 mg | DELAYED_RELEASE_TABLET | Freq: Every day | ORAL | Status: DC
  Administered 2022-10-29 – 2022-11-06 (×9): 81 mg via ORAL
  Filled 2022-10-28 (×9): qty 1

## 2022-10-28 MED ORDER — LACOSAMIDE 50 MG PO TABS
150.0000 mg | ORAL_TABLET | Freq: Two times a day (BID) | ORAL | Status: DC
  Administered 2022-10-28 – 2022-11-03 (×12): 150 mg via ORAL
  Filled 2022-10-28 (×12): qty 3

## 2022-10-28 MED ORDER — ENOXAPARIN SODIUM 40 MG/0.4ML IJ SOSY
40.0000 mg | PREFILLED_SYRINGE | INTRAMUSCULAR | Status: DC
  Administered 2022-10-28 – 2022-11-04 (×8): 40 mg via SUBCUTANEOUS
  Filled 2022-10-28 (×8): qty 0.4

## 2022-10-28 MED ORDER — MORPHINE SULFATE (PF) 4 MG/ML IV SOLN
4.0000 mg | Freq: Once | INTRAVENOUS | Status: AC
  Administered 2022-10-28: 4 mg via INTRAVENOUS
  Filled 2022-10-28: qty 1

## 2022-10-28 MED ORDER — ATORVASTATIN CALCIUM 20 MG PO TABS
40.0000 mg | ORAL_TABLET | Freq: Every day | ORAL | Status: DC
  Administered 2022-10-29 – 2022-11-06 (×9): 40 mg via ORAL
  Filled 2022-10-28 (×9): qty 2

## 2022-10-28 MED ORDER — GABAPENTIN 300 MG PO CAPS
300.0000 mg | ORAL_CAPSULE | Freq: Two times a day (BID) | ORAL | Status: DC
  Administered 2022-10-28 – 2022-11-06 (×18): 300 mg via ORAL
  Filled 2022-10-28 (×18): qty 1

## 2022-10-28 NOTE — Plan of Care (Signed)
°  Problem: Activity: °Goal: Risk for activity intolerance will decrease °Outcome: Progressing °  °Problem: Elimination: °Goal: Will not experience complications related to urinary retention °Outcome: Progressing °  °Problem: Pain Managment: °Goal: General experience of comfort will improve °Outcome: Progressing °  °Problem: Safety: °Goal: Ability to remain free from injury will improve °Outcome: Progressing °  °

## 2022-10-28 NOTE — Assessment & Plan Note (Addendum)
Patient presenting with hyponatremia with sodium of 122.  Serum osmolality decreased at 261 with urine sodium of 20 and urine osmolality of 291.  Differential includes hypovolemic hyponatremia given altered mental status versus SIADH.  Patient is not on any thiazide diuretics.  TSH was recently checked on previous admission and within normal limits.   - Nephrology consulted; appreciate their recommendations - 500 cc bolus of normal saline - After complete bolus, will recheck serum osmolality, urine osmolality, and urine sodium - If SIADH, will further investigate possible etiology - Given minimal symptoms at this time, no indication for 3% saline

## 2022-10-28 NOTE — Assessment & Plan Note (Signed)
Continue home antihypertensives 

## 2022-10-28 NOTE — Assessment & Plan Note (Signed)
Patient presenting with altered mental status including that include difficulty understanding her current situation in the hospital.  She is otherwise alert and oriented x 3 and able to follow commands consistently.  Acute encephalopathy is most likely in the setting of hyponatremia, however differential also includes possible prolonged postictal state given she had a fall 2 days ago of unknown etiology.  At this time, will focus on correcting hyponatremia, and if patient remains altered, will consult neurology.  Discussed seizure disorder with Dr. [name] today, please see below for details.  - Management of hyponatremia as noted above - Management of seizure disorder order as noted below

## 2022-10-28 NOTE — Assessment & Plan Note (Signed)
Patient appears euvolemic at this time.  Holding home Lasix in the setting of hyponatremia.  - Restart home Lasix as able

## 2022-10-28 NOTE — Assessment & Plan Note (Signed)
Patient has a history of focal epilepsy that per chart review, presents has both generalized tonic-clonic as well as focal with left arm jerking.  While was in the room, patient had a very brief episode of left arm jerking that she states is consistent with her "little seizures."  Discussed with Dr. [name] who recommends continuing current medications and focusing on correcting hyponatremia.  If she continues to have breakthrough episodes, could give an extra dose of Vimpat 100 mg and increase her home Vimpat to 200 mg twice daily.    - Continue home Keppra and Vimpat - Seizure precautions - If patient should continue to have episodes despite correction of hyponatremia, will reconsult neurology at that time.  Appreciate their recommendations today.

## 2022-10-28 NOTE — Assessment & Plan Note (Signed)
Renal function currently at baseline.    - Continue to monitor while admitted

## 2022-10-28 NOTE — ED Provider Notes (Signed)
[facility] Provider Note    Event Date/Time   First MD Initiated Contact with Patient 10/28/22 1056     (approximate)   History   Chief Complaint Altered Mental Status   HPI  Anne Phillips is a 72 y.o. female with past medical history of hypertension, hyperlipidemia, CAD, seizures, diastolic CHF, and breast cancer who presents to the ED for altered mental status.  Per EMS, patient had a fall at her nursing facility 2 days ago, but refused transport to the ED at that time.  Staff then noticed that patient has been slightly more confused over the past couple of days, eventually called EMS today.  Patient currently states that she "feels wonderful."  She does complain of right-sided frontal headache, right shoulder pain, and right knee pain following her fall.  She does not recall what caused her to fall.  She denies any fevers, cough, chest pain, shortness of breath, nausea, vomiting, diarrhea, or dysuria.     Physical Exam   Triage Vital Signs: ED Triage Vitals  Enc Vitals Group     BP 10/28/22 1054 130/79     Pulse Rate 10/28/22 1054 61     Resp 10/28/22 1054 19     Temp 10/28/22 1054 98 F (36.7 C)     Temp Source 10/28/22 1054 Oral     SpO2 10/28/22 1054 94 %     Weight 10/28/22 1057 119 lb (54 kg)     Height 10/28/22 1057 '5\' 1"'$  (1.549 m)     Head Circumference --      Peak Flow --      Pain Score 10/28/22 1056 4     Pain Loc --      Pain Edu? --      Excl. in [place]? --     Most recent vital signs: Vitals:   10/28/22 1230 10/28/22 1342  BP: 96/73 112/64  Pulse: 61 (!) 59  Resp: 15 13  Temp:    SpO2: 96% 98%    Constitutional: Alert and oriented to person, place, and time, but not situation. Eyes: Conjunctivae are normal. Head: Small amount of ecchymosis and edema to right frontal scalp. Nose: No congestion/rhinnorhea. Mouth/Throat: Mucous membranes are moist.  Neck: No midline cervical spine tenderness to  palpation. Cardiovascular: Normal rate, regular rhythm. Grossly normal heart sounds.  2+ radial pulses bilaterally. Respiratory: Normal respiratory effort.  No retractions. Lungs CTAB.  Right chest wall tenderness to palpation noted. Gastrointestinal: Soft and nontender. No distention. Musculoskeletal: Diffuse tenderness to palpation of right shoulder with no obvious deformity.  Diffuse tenderness to palpation at right knee with no obvious deformity. Neurologic:  Normal speech and language. No gross focal neurologic deficits are appreciated.    ED Results / Procedures / Treatments   Labs (all labs ordered are listed, but only abnormal results are displayed) Labs Reviewed  CBC WITH DIFFERENTIAL/PLATELET - Abnormal; Notable for the following components:      Result Value   RBC 3.54 (*)    Hemoglobin 10.0 (*)    HCT 30.2 (*)    Lymphs Abs 0.4 (*)    All other components within normal limits  COMPREHENSIVE METABOLIC PANEL - Abnormal; Notable for the following components:   Sodium 122 (*)    Chloride 87 (*)    Glucose, Bld 110 (*)    Creatinine, Ser 1.25 (*)    Alkaline Phosphatase 137 (*)    Total Bilirubin 1.4 (*)    GFR, Estimated  46 (*)    All other components within normal limits  URINALYSIS, ROUTINE W REFLEX MICROSCOPIC - Abnormal; Notable for the following components:   Color, Urine YELLOW (*)    APPearance CLOUDY (*)    Glucose, UA 150 (*)    Leukocytes,Ua MODERATE (*)    Bacteria, UA FEW (*)    All other components within normal limits     EKG  ED ECG REPORT I, Blake Divine, the attending physician, personally viewed and interpreted this ECG.   Date: 10/28/2022  EKG Time: 10:57  Rate: 62  Rhythm: normal sinus rhythm  Axis: Normal  Intervals:none  ST&T Change: Lateral T wave inversions, similar to previous  RADIOLOGY CT head reviewed and interpreted by me with no hemorrhage or midline shift.  Shoulder x-ray reviewed and interpreted by me with distal clavicle  fracture.  PROCEDURES:  Critical Care performed: Yes, see critical care procedure note(s)  .Critical Care  Performed by: Blake Divine, MD Authorized by: Blake Divine, MD   Critical care provider statement:    Critical care time (minutes):  30   Critical care time was exclusive of:  Separately billable procedures and treating other patients and teaching time   Critical care was necessary to treat or prevent imminent or life-threatening deterioration of the following conditions:  Metabolic crisis   Critical care was time spent personally by me on the following activities:  Development of treatment plan with patient or surrogate, discussions with consultants, evaluation of patient's response to treatment, examination of patient, ordering and review of laboratory studies, ordering and review of radiographic studies, ordering and performing treatments and interventions, pulse oximetry, re-evaluation of patient's condition and review of old charts   I assumed direction of critical care for this patient from another provider in my specialty: no     Care discussed with: admitting provider      MEDICATIONS ORDERED IN ED: Medications  morphine (PF) 4 MG/ML injection 4 mg (4 mg Intravenous Given 10/28/22 1337)     IMPRESSION / MDM / [place] / ED COURSE  I reviewed the triage vital signs and the nursing notes.                              72 y.o. female with past medical history of hypertension, hyperlipidemia, CAD, seizure, diastolic CHF, and breast cancer who presents to the ED for worsening confusion after a fall at her nursing facility 2 days ago.  Patient's presentation is most consistent with acute presentation with potential threat to life or bodily function.  Differential diagnosis includes, but is not limited to, intracranial hemorrhage, cervical spine injury, shoulder fracture, dislocation, knee injury, electrolyte abnormality, anemia, UTI, pneumonia.  Patient  nontoxic-appearing and in no acute distress, vital signs are unremarkable.  She has no focal neurologic deficits on exam, does have signs of trauma to her head with diffuse tenderness of her right shoulder and knee.  We will further assess with CT of her head and cervical spine, also check x-ray of her right shoulder and knee.  Labs, chest x-ray, and urinalysis are also pending at this time.  CT head and cervical spine are negative for acute process, chest x-ray shows no evidence of rib injury or other injury to her thorax, does show distal clavicle fracture also seen on shoulder x-ray.  There is questionable proximal humerus fracture, however CT imaging shows evidence of old injury with no acute fracture.  Right  knee x-ray is unremarkable.  Lab results without significant anemia or leukocytosis, patient noted to be hyponatremic, which she has dealt with in the past but never to this severity.  No significant AKI or other electrolyte abnormality noted, LFTs are unremarkable, urinalysis with no signs of infection.  Patient would benefit from admission to the hospital service for further management of hyponatremia, case was discussed with Dr. Roland Rack of orthopedic service at the request of the hospitalist.  We will place her in a sling for comfort for now, pain improved following dose of IV morphine.  Case discussed with hospitalist for admission.      FINAL CLINICAL IMPRESSION(S) / ED DIAGNOSES   Final diagnoses:  Fall, initial encounter  Thyroid goiter  Hyponatremia  Closed displaced fracture of acromial end of right clavicle, initial encounter     Rx / DC Orders   ED Discharge Orders     None        Note:  This document was prepared using Dragon voice recognition software and may include unintentional dictation errors.   Blake Divine, MD 10/28/22 [phone]

## 2022-10-28 NOTE — H&P (Signed)
History and Physical    Patient: Anne Phillips [id number] DOB: 04/08/1951 DOA: 10/28/2022 DOS: the patient was seen and examined on 10/28/2022 PCP: Patient, No Pcp Per  Patient coming from: SNF  Chief Complaint:  Chief Complaint  Patient presents with   Altered Mental Status   HPI: Anne Phillips is a 72 y.o. female with medical history significant of seizure disorder, hypertension, hyperlipidemia, CAD, breast cancer s/p mastectomy, hypothyroidism, who presents to the ED with complaints of altered mental status.  History obtained from both patient and through chart review due to altered mental status.  Anne Phillips states that she is generally unsure why she is here in the hospital.  She recalls having a fall couple days ago but cannot recall how she fell or why she fell.  She does recall hitting her head but cannot give me any more details.  She believes she has been eating and drinking regularly over the last few days.  She endorses is right shoulder pain that is exacerbated by movement.  She denies any chest pain, shortness of breath or palpitations.  While sling was being placed, patient's left arm began jerking and she stated that she feels like she is having a seizure.  She states that she occasionally has a "little seizures" and this has been occurring since she was 72 years old.  Contacted patient's sister, Anne Phillips.  Anne Phillips states Anne Phillips has been acting confused over the past couple days.  Yesterday when she visited, patient asked for her car keys to go to the bathroom.  In addition, patient was asking to go home repeatedly and didn't know where she was. She did recognize family though.   Patient was recently admitted from 10/10/2022 to 10/17/2022 for transient neurological symptoms including generalized weakness and slurred speech.  CVA was ruled out with negative MRI.  Neurology consulted and felt symptoms most likely due to breakthrough seizure with a postictal state.  Patient  started on Keppra and Vimpat.  During admission, hyponatremia was noted and felt to be due to poor p.o. intake with increased free water.  She was treated with one-time dose of tolvaptan with improvement.  ED course: On arrival to the ED, patient was normotensive at 130/79 with heart rate of 61.  She was saturating at 94% on room air.  She was afebrile at 48 Fahrenheit. Initial lab work remarkable for WBC of 7.1, hemoglobin of 10.0, sodium of 122, chloride of 87, glucose of 110, creatinine of 1.25, total bilirubin of 1.4 and GFR 46.  Urinalysis demonstrated glucosuria, moderate leukocytes, and few bacteria but increased squamous epithelial cells.  X-ray of the chest, knee and shoulder were obtained remarkable for a potential humerus fracture and right clavicle fracture and thyromegaly with leftward displacement of the trachea.  TRH contacted for admission for symptomatic hyponatremia.  Review of Systems: As mentioned in the history of present illness. All other systems reviewed and are negative.  Past Medical History:  Diagnosis Date   Cancer [facility])    Coronary artery disease    Hyperlipidemia    Hypertension    Seizures ([place])    Slurred speech 10/11/2022   Thyroid disease    Past Surgical History:  Procedure Laterality Date   MASTECTOMY Left    Social History:  reports that she has been smoking cigarettes. She has been smoking an average of 1 pack per day. She has never used smokeless tobacco. She reports that she does not drink alcohol and does not use drugs.  Allergies  Allergen Reactions   Iodinated Contrast Media Hives and Other (See Comments)    Rash    Other Hives    Metal-reaction=rash Metal-reaction=rash     Family History  Problem Relation Age of Onset   Hypertension Other     Prior to Admission medications   Medication Sig Start Date End Date Taking? Authorizing Provider  acetaminophen (TYLENOL) 325 MG tablet Take 2 tablets (650 mg total) by mouth every 6 (six) hours  as needed for mild pain (or Fever >/= 101). 10/05/20  Yes Domenic Polite, MD  aspirin EC 81 MG tablet Take 1 tablet (81 mg total) by mouth daily. Swallow whole. 10/18/22  Yes Max Sane, MD  atorvastatin (LIPITOR) 40 MG tablet Take 40 mg by mouth daily.   Yes [provider]  carvedilol (COREG) 6.25 MG tablet Take 1 tablet (6.25 mg total) by mouth 2 (two) times daily with a meal. 08/06/21  Yes Sharen Hones, MD  empagliflozin (JARDIANCE) 10 MG TABS tablet Take 10 mg by mouth daily.   Yes [provider]  furosemide (LASIX) 20 MG tablet Take 1 tablet (20 mg total) by mouth daily. 10/27/21  Yes Fritzi Mandes, MD  gabapentin (NEURONTIN) 300 MG capsule Take 1 capsule (300 mg total) by mouth 2 (two) times daily. 10/05/20  Yes Domenic Polite, MD  Lacosamide (VIMPAT) 150 MG TABS Take 1 tablet (150 mg total) by mouth in the morning and at bedtime. 08/06/21  Yes Sharen Hones, MD  levETIRAcetam (KEPPRA) 750 MG tablet Take 1 tablet (750 mg total) by mouth 2 (two) times daily. 10/17/22  Yes Max Sane, MD  losartan (COZAAR) 100 MG tablet Take 100 mg by mouth daily. 08/19/22  Yes [provider]  Multiple Vitamin (MULTIVITAMIN WITH MINERALS) TABS tablet Take 1 tablet by mouth daily. 10/18/22  Yes Max Sane, MD  nicotine (NICODERM CQ - DOSED IN MG/24 HOURS) 14 mg/24hr patch Place 1 patch (14 mg total) onto the skin daily. 10/18/22  Yes Max Sane, MD  vitamin B-12 1000 MCG tablet Take 1 tablet (1,000 mcg total) by mouth daily. 08/06/21  Yes Sharen Hones, MD  feeding supplement (ENSURE ENLIVE / ENSURE PLUS) LIQD Take 237 mLs by mouth 2 (two) times daily between meals. 10/17/22   Max Sane, MD  nitroGLYCERIN (NITROSTAT) 0.4 MG SL tablet Place under the tongue. 05/26/22   [provider]  tacrolimus (PROTOPIC) 0.1 % ointment Apply 1 Application topically daily as needed. 07/29/22   [provider]    Physical Exam: Vitals:   10/28/22 1342 10/28/22 1400 10/28/22 1415 10/28/22  1510  BP: 112/64 123/64  (!) 140/83  Pulse: (!) 59 (!) 58 60 (!) 55  Resp: '13 19 20 15  '$ Temp:  98 F (36.7 C)  98 F (36.7 C)  TempSrc:  Oral    SpO2: 98% 100% 95% 99%  Weight:      Height:       Physical Exam Vitals and nursing note reviewed.  Constitutional:      General: She is not in acute distress.    Appearance: She is normal weight. She is not toxic-appearing.  HENT:     Head: Normocephalic.     Comments: 4 cm hematoma that is nontender on the right forehead.    Mouth/Throat:     Mouth: Mucous membranes are moist.     Pharynx: Oropharynx is clear.     Comments: Poor dentition noted Eyes:     Conjunctiva/sclera: Conjunctivae normal.  Pupils: Pupils are equal, round, and reactive to light.  Cardiovascular:     Rate and Rhythm: Normal rate and regular rhythm.     Heart sounds: No murmur heard.    No gallop.  Pulmonary:     Effort: Pulmonary effort is normal. No respiratory distress.     Breath sounds: Normal breath sounds. No wheezing, rhonchi or rales.  Abdominal:     General: Bowel sounds are normal. There is no distension.     Palpations: Abdomen is soft.     Tenderness: There is no abdominal tenderness. There is no guarding.  Neurological:     Mental Status: She is alert.     Comments:  Patient is alert and oriented to person, place, time.  She initial he was uncertain why she came to the ED.  After being explained why it is important to stay in the hospital, patient displayed difficulty understanding despite multiple explanations.  Moving all extremities against gravity independently  5-6 seconds of jerking of the left arm during examination  Psychiatric:        Mood and Affect: Mood normal.        Behavior: Behavior normal.    Data Reviewed: CBC with WBC of 7.1, hemoglobin of 10.0, platelets of 223 CMP with sodium of 122, potassium 4.2, chloride 87, bicarb 23, glucose of 110, BUN of 18, creatinine 1.25, anion gap of 12, alkaline phosphatase 137, AST  of 39, ALT 13 and GFR 46 Serum osmolality decreased at 262 Urinalysis with glucosuria, moderate leukocytes, few bacteria but elevated squamous epithelial cells at 11-20/hpf Urine chloride 30 Urine osmolality elevated at 291 Urine sodium 20  EKG personally reviewed.  Sinus rhythm with first-degree AV block.  No ST or T wave changes concerning for acute ischemia T waves present in lateral leads consistent with prior EKGs.  CT Shoulder Right Wo Contrast  Result Date: 10/28/2022 CLINICAL DATA:  Shoulder pain since falling 2 days ago. Comminuted right clavicle fracture. EXAM: CT OF THE UPPER RIGHT EXTREMITY WITHOUT CONTRAST TECHNIQUE: Multidetector CT imaging of the right shoulder was performed according to the standard protocol. RADIATION DOSE REDUCTION: This exam was performed according to the departmental dose-optimization program which includes automated exposure control, adjustment of the mA and/or kV according to patient size and/or use of iterative reconstruction technique. COMPARISON:  Radiographs 10/28/2022 and 10/03/2020. FINDINGS: Bones/Joint/Cartilage There is a comminuted fracture of the distal 3rd of the right clavicular shaft. This fracture is associated with 1 shaft with of inferior displacement of the distal fragment and 2.6 cm of overriding of the fracture fragments. The acromioclavicular joint is intact. The sternoclavicular joint is not imaged. The humeral head is located. There is deformity of the greater tuberosity which may relate to an old fracture as correlated with previous radiographs. A mild acute superimposed impaction fracture is difficult to exclude. No evidence of acute scapular fracture. No significant shoulder joint effusion. Ligaments Suboptimally assessed by CT. Muscles and Tendons No focal rotator cuff muscular atrophy or hematoma identified. No gross rotator cuff impingement. Soft tissues There is moderate soft tissue swelling/hemorrhage surrounding the comminuted clavicle  fracture. No focal hematoma or other fluid collection. No evidence of foreign body or soft tissue emphysema. No evidence of acute vascular injury. IMPRESSION: 1. Comminuted, displaced and overriding fracture of the distal 3rd of the right clavicular shaft with moderate surrounding soft tissue swelling. 2. Deformity of the greater tuberosity may relate to an old fracture as correlated with previous radiographs. A mild superimposed acute  impaction fracture is difficult to exclude. 3. No evidence of acute scapular fracture. 4. No evidence of acute vascular injury. Electronically Signed   By: Richardean Sale M.D.   On: 10/28/2022 13:57   DG Chest 2 View  Result Date: 10/28/2022 CLINICAL DATA:  Provided history: Fall.  Confusion. EXAM: CHEST - 2 VIEW COMPARISON:  Prior chest radiographs 10/25/2021 and earlier. Thyroid ultrasound 08/03/2021. FINDINGS: Heart size at the upper limits of normal. Aortic atherosclerosis. Left basilar atelectasis on the PA radiograph, not well appreciated on the lateral view radiograph. No appreciable airspace consolidation on the right. No sizable pleural effusion or evidence of pneumothorax. Acute, displaced fracture of the mid to distal right clavicle. Thyromegaly with leftward displacement of the trachea. IMPRESSION: 1. Acute, displaced fracture of the mid-to-distal right clavicle. 2. Left basilar atelectasis present on the PA radiograph, but not well appreciated on the subsequent lateral view radiograph. 3. Thyromegaly with leftward displacement of the trachea. 4. Aortic Atherosclerosis (ICD10-I70.0). Electronically Signed   By: Kellie Simmering D.O.   On: 10/28/2022 12:13   DG Shoulder Right  Result Date: 10/28/2022 CLINICAL DATA:  Fall, pain EXAM: RIGHT SHOULDER - 2+ VIEW COMPARISON:  None Available. FINDINGS: Suspect a minimally displaced fracture of the greater tuberosity of the right humerus. Displaced and foreshortened oblique fracture of the distal third of the right clavicle.  Mild glenohumeral and acromioclavicular arthrosis. Partially imaged right chest unremarkable. IMPRESSION: 1. Suspect a minimally displaced fracture of the greater tuberosity of the right humerus. 2. Displaced and foreshortened oblique fracture of the distal third of the right clavicle. 3. Consider CT to further evaluate fracture anatomy. Electronically Signed   By: Delanna Ahmadi M.D.   On: 10/28/2022 12:13   DG Knee 2 Views Right  Result Date: 10/28/2022 CLINICAL DATA:  Fall.  Right knee pain. EXAM: RIGHT KNEE - 1-2 VIEW COMPARISON:  None Available. FINDINGS: Two view study. No acute fracture or dislocation evident. No joint effusion. Bones are diffusely demineralized. Loss of joint space noted medial and lateral compartments with prominent hypertrophic spurring. IMPRESSION: 1. No acute bony findings. 2. Degenerative changes in the medial and lateral compartments. Electronically Signed   By: Misty Stanley M.D.   On: 10/28/2022 12:09   CT Head Wo Contrast  Result Date: 10/28/2022 CLINICAL DATA:  Minor head trauma EXAM: CT HEAD WITHOUT CONTRAST CT CERVICAL SPINE WITHOUT CONTRAST TECHNIQUE: Multidetector CT imaging of the head and cervical spine was performed following the standard protocol without intravenous contrast. Multiplanar CT image reconstructions of the cervical spine were also generated. RADIATION DOSE REDUCTION: This exam was performed according to the departmental dose-optimization program which includes automated exposure control, adjustment of the mA and/or kV according to patient size and/or use of iterative reconstruction technique. COMPARISON:  10/15/2022 FINDINGS: CT HEAD FINDINGS Brain: No evidence of acute infarction, hemorrhage, hydrocephalus, extra-axial collection or mass lesion/mass effect. Generalized atrophy. Chronic small vessel ischemic low-density in the cerebral white matter. 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 Vascular: No hyperdense vessel or unexpected  calcification. Skull: Right anterior scalp swelling and hemorrhage. No acute fracture Sinuses/Orbits: Negative CT CERVICAL SPINE FINDINGS Alignment: Normal. Skull base and vertebrae: No acute fracture. No primary bone lesion or focal pathologic process. Soft tissues and spinal canal: No prevertebral fluid or swelling. No visible canal hematoma. Known multinodular goiter. No follow-up recommended unless clinically warranted (ref: J Am Coll Radiol. 2015 Feb;12(2): 143-50). Disc levels:  Ordinary degenerative change Upper chest: Partially covered right mid clavicle fracture with adjacent  soft tissue swelling. IMPRESSION: 1. No evidence of acute intracranial or cervical spine injury. 2. Scalp hematoma without calvarial fracture. 3. Acute right clavicle fracture. 4. Multiple multinodular goiter, reference thyroid ultrasound with recommendation 08/03/2021. Electronically Signed   By: Jorje Guild M.D.   On: 10/28/2022 12:01   CT Cervical Spine Wo Contrast  Result Date: 10/28/2022 CLINICAL DATA:  Minor head trauma EXAM: CT HEAD WITHOUT CONTRAST CT CERVICAL SPINE WITHOUT CONTRAST TECHNIQUE: Multidetector CT imaging of the head and cervical spine was performed following the standard protocol without intravenous contrast. Multiplanar CT image reconstructions of the cervical spine were also generated. RADIATION DOSE REDUCTION: This exam was performed according to the departmental dose-optimization program which includes automated exposure control, adjustment of the mA and/or kV according to patient size and/or use of iterative reconstruction technique. COMPARISON:  10/15/2022 FINDINGS: CT HEAD FINDINGS Brain: No evidence of acute infarction, hemorrhage, hydrocephalus, extra-axial collection or mass lesion/mass effect. Generalized atrophy. Chronic small vessel ischemic low-density in the cerebral white matter. 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 Vascular: No hyperdense vessel or unexpected  calcification. Skull: Right anterior scalp swelling and hemorrhage. No acute fracture Sinuses/Orbits: Negative CT CERVICAL SPINE FINDINGS Alignment: Normal. Skull base and vertebrae: No acute fracture. No primary bone lesion or focal pathologic process. Soft tissues and spinal canal: No prevertebral fluid or swelling. No visible canal hematoma. Known multinodular goiter. No follow-up recommended unless clinically warranted (ref: J Am Coll Radiol. 2015 Feb;12(2): 143-50). Disc levels:  Ordinary degenerative change Upper chest: Partially covered right mid clavicle fracture with adjacent soft tissue swelling. IMPRESSION: 1. No evidence of acute intracranial or cervical spine injury. 2. Scalp hematoma without calvarial fracture. 3. Acute right clavicle fracture. 4. Multiple multinodular goiter, reference thyroid ultrasound with recommendation 08/03/2021. Electronically Signed   By: Jorje Guild M.D.   On: 10/28/2022 12:01    There are no new results to review at this time.  Assessment and Plan:  * Hyponatremia Patient presenting with hyponatremia with sodium of 122.  Serum osmolality decreased at 261 with urine sodium of 20 and urine osmolality of 291.  Differential includes hypovolemic hyponatremia given altered mental status versus SIADH.  Patient is not on any thiazide diuretics.  TSH was recently checked on previous admission and within normal limits.   - Nephrology consulted; appreciate their recommendations - 500 cc bolus of normal saline - After complete bolus, will recheck serum osmolality, urine osmolality, and urine sodium - If SIADH, will further investigate possible etiology - Given minimal symptoms at this time, no indication for 3% saline  Acute encephalopathy Patient presenting with altered mental status including that include difficulty understanding her current situation in the hospital.  She is otherwise alert and oriented x 3 and able to follow commands consistently.  Acute  encephalopathy is most likely in the setting of hyponatremia, however differential also includes possible prolonged postictal state given she had a fall 2 days ago of unknown etiology.  At this time, will focus on correcting hyponatremia, and if patient remains altered, will consult neurology.  Discussed seizure disorder with Dr. [name] today, please see below for details.  - Management of hyponatremia as noted above - Management of seizure disorder order as noted below  Seizure disorder [facility]) Patient has a history of focal epilepsy that per chart review, presents has both generalized tonic-clonic as well as focal with left arm jerking.  While was in the room, patient had a very brief episode of left arm jerking that she states  is consistent with her "little seizures."  Discussed with Dr. [name] who recommends continuing current medications and focusing on correcting hyponatremia.  If she continues to have breakthrough episodes, could give an extra dose of Vimpat 100 mg and increase her home Vimpat to 200 mg twice daily.    - Continue home Keppra and Vimpat - Seizure precautions - If patient should continue to have episodes despite correction of hyponatremia, will reconsult neurology at that time.  Appreciate their recommendations today.  Stage 3b chronic kidney disease ([place]) Renal function currently at baseline.    - Continue to monitor while admitted  Hypertension - Continue home antihypertensives  (HFpEF) heart failure with preserved ejection fraction [facility]) Patient appears euvolemic at this time.  Holding home Lasix in the setting of hyponatremia.  - Restart home Lasix as able   Advance Care Planning:   Code Status: Full Code.  Attempted to discuss CODE STATUS with patient.  She initially stated that she wanted to be DNR/DNI as she does not have any family to take care of her and does not want anyone to have to take care of her.  Given her difficulty understanding reasoning for  hospitalization after multiple explanations, she does not have medical capacity at this time.  Discussed with patient's Sister Anne Phillips, who would like for patient to remain full code at this time.  Consults: Nephrology  Family Communication: Patient's Sister Anne Phillips updated via telephone  Severity of Illness: The appropriate patient status for this patient is OBSERVATION. Observation status is judged to be reasonable and necessary in order to provide the required intensity of service to ensure the patient's safety. The patient's presenting symptoms, physical exam findings, and initial radiographic and laboratory data in the context of their medical condition is felt to place them at decreased risk for further clinical deterioration. Furthermore, it is anticipated that the patient will be medically stable for discharge from the hospital within 2 midnights of admission.   Author: Jose Persia, MD 10/28/2022 4:23 PM  For on call review [email or web address].

## 2022-10-28 NOTE — ED Notes (Signed)
MD Charleen Kirks at bedside. Pt placed in immobilized sling for right shoulder.

## 2022-10-28 NOTE — ED Triage Notes (Signed)
Pt bib ems from [facility] c/o [place] on 02/25, refused to go to ER that time. Here for more confusion; confuse at baseline. Unknown "orientation baseline". normally ambulatory. Pt is complaining of knee pain.  Rt forehead hematoma noted, Pt is ao x 3 at the moment, disoriented to situation.   132/60 HR 67 SPO2 97% Cbg 140

## 2022-10-29 DIAGNOSIS — N1832 Chronic kidney disease, stage 3b: Secondary | ICD-10-CM | POA: Diagnosis present

## 2022-10-29 DIAGNOSIS — E0520 Thyrotoxicosis with toxic multinodular goiter without thyrotoxic crisis or storm: Secondary | ICD-10-CM | POA: Diagnosis present

## 2022-10-29 DIAGNOSIS — G40109 Localization-related (focal) (partial) symptomatic epilepsy and epileptic syndromes with simple partial seizures, not intractable, without status epilepticus: Secondary | ICD-10-CM | POA: Diagnosis present

## 2022-10-29 DIAGNOSIS — E039 Hypothyroidism, unspecified: Secondary | ICD-10-CM | POA: Diagnosis present

## 2022-10-29 DIAGNOSIS — E861 Hypovolemia: Secondary | ICD-10-CM | POA: Diagnosis present

## 2022-10-29 DIAGNOSIS — I130 Hypertensive heart and chronic kidney disease with heart failure and stage 1 through stage 4 chronic kidney disease, or unspecified chronic kidney disease: Secondary | ICD-10-CM | POA: Diagnosis present

## 2022-10-29 DIAGNOSIS — E639 Nutritional deficiency, unspecified: Secondary | ICD-10-CM | POA: Diagnosis present

## 2022-10-29 DIAGNOSIS — E222 Syndrome of inappropriate secretion of antidiuretic hormone: Secondary | ICD-10-CM | POA: Diagnosis present

## 2022-10-29 DIAGNOSIS — Z8249 Family history of ischemic heart disease and other diseases of the circulatory system: Secondary | ICD-10-CM | POA: Diagnosis not present

## 2022-10-29 DIAGNOSIS — Z888 Allergy status to other drugs, medicaments and biological substances status: Secondary | ICD-10-CM | POA: Diagnosis not present

## 2022-10-29 DIAGNOSIS — Z91041 Radiographic dye allergy status: Secondary | ICD-10-CM | POA: Diagnosis not present

## 2022-10-29 DIAGNOSIS — E871 Hypo-osmolality and hyponatremia: Secondary | ICD-10-CM | POA: Diagnosis present

## 2022-10-29 DIAGNOSIS — G9341 Metabolic encephalopathy: Secondary | ICD-10-CM | POA: Diagnosis present

## 2022-10-29 DIAGNOSIS — Z9012 Acquired absence of left breast and nipple: Secondary | ICD-10-CM | POA: Diagnosis not present

## 2022-10-29 DIAGNOSIS — I2510 Atherosclerotic heart disease of native coronary artery without angina pectoris: Secondary | ICD-10-CM | POA: Diagnosis present

## 2022-10-29 DIAGNOSIS — Y92129 Unspecified place in nursing home as the place of occurrence of the external cause: Secondary | ICD-10-CM | POA: Diagnosis not present

## 2022-10-29 DIAGNOSIS — S42031A Displaced fracture of lateral end of right clavicle, initial encounter for closed fracture: Secondary | ICD-10-CM | POA: Diagnosis present

## 2022-10-29 DIAGNOSIS — Z66 Do not resuscitate: Secondary | ICD-10-CM | POA: Diagnosis present

## 2022-10-29 DIAGNOSIS — D631 Anemia in chronic kidney disease: Secondary | ICD-10-CM | POA: Diagnosis present

## 2022-10-29 DIAGNOSIS — R4182 Altered mental status, unspecified: Secondary | ICD-10-CM | POA: Diagnosis not present

## 2022-10-29 DIAGNOSIS — I5032 Chronic diastolic (congestive) heart failure: Secondary | ICD-10-CM | POA: Diagnosis present

## 2022-10-29 DIAGNOSIS — R81 Glycosuria: Secondary | ICD-10-CM | POA: Diagnosis present

## 2022-10-29 DIAGNOSIS — Z79899 Other long term (current) drug therapy: Secondary | ICD-10-CM | POA: Diagnosis not present

## 2022-10-29 DIAGNOSIS — E785 Hyperlipidemia, unspecified: Secondary | ICD-10-CM | POA: Diagnosis present

## 2022-10-29 DIAGNOSIS — F17210 Nicotine dependence, cigarettes, uncomplicated: Secondary | ICD-10-CM | POA: Diagnosis present

## 2022-10-29 LAB — BASIC METABOLIC PANEL
Anion gap: 11 (ref 5–15)
Anion gap: 7 (ref 5–15)
BUN: 14 mg/dL (ref 8–23)
BUN: 15 mg/dL (ref 8–23)
CO2: 22 mmol/L (ref 22–32)
CO2: 23 mmol/L (ref 22–32)
Calcium: 8.6 mg/dL — ABNORMAL LOW (ref 8.9–10.3)
Calcium: 8.6 mg/dL — ABNORMAL LOW (ref 8.9–10.3)
Chloride: 91 mmol/L — ABNORMAL LOW (ref 98–111)
Chloride: 93 mmol/L — ABNORMAL LOW (ref 98–111)
Creatinine, Ser: 1.12 mg/dL — ABNORMAL HIGH (ref 0.44–1.00)
Creatinine, Ser: 1.15 mg/dL — ABNORMAL HIGH (ref 0.44–1.00)
GFR, Estimated: 51 mL/min — ABNORMAL LOW (ref >60)
GFR, Estimated: 53 mL/min — ABNORMAL LOW (ref >60)
Glucose, Bld: 102 mg/dL — ABNORMAL HIGH (ref 70–99)
Glucose, Bld: 109 mg/dL — ABNORMAL HIGH (ref 70–99)
Potassium: 3.5 mmol/L (ref 3.5–5.1)
Potassium: 3.9 mmol/L (ref 3.5–5.1)
Sodium: 123 mmol/L — ABNORMAL LOW (ref 135–145)
Sodium: 124 mmol/L — ABNORMAL LOW (ref 135–145)

## 2022-10-29 LAB — CBC WITH DIFFERENTIAL/PLATELET
Abs Immature Granulocytes: 0.04 10*3/uL (ref 0.00–0.07)
Basophils Absolute: 0.1 10*3/uL (ref 0.0–0.1)
Basophils Relative: 1 %
Eosinophils Absolute: 0.2 10*3/uL (ref 0.0–0.5)
Eosinophils Relative: 3 %
HCT: 24.2 % — ABNORMAL LOW (ref 36.0–46.0)
Hemoglobin: 8.2 g/dL — ABNORMAL LOW (ref 12.0–15.0)
Immature Granulocytes: 1 %
Lymphocytes Relative: 10 %
Lymphs Abs: 0.7 10*3/uL (ref 0.7–4.0)
MCH: 28.5 pg (ref 26.0–34.0)
MCHC: 33.9 g/dL (ref 30.0–36.0)
MCV: 84 fL (ref 80.0–100.0)
Monocytes Absolute: 0.9 10*3/uL (ref 0.1–1.0)
Monocytes Relative: 13 %
Neutro Abs: 4.9 10*3/uL (ref 1.7–7.7)
Neutrophils Relative %: 72 %
Platelets: 205 10*3/uL (ref 150–400)
RBC: 2.88 MIL/uL — ABNORMAL LOW (ref 3.87–5.11)
RDW: 13.5 % (ref 11.5–15.5)
WBC: 6.8 10*3/uL (ref 4.0–10.5)
nRBC: 0 % (ref 0.0–0.2)

## 2022-10-29 LAB — PHOSPHORUS: Phosphorus: 3 mg/dL (ref 2.5–4.6)

## 2022-10-29 LAB — IRON AND TIBC
Iron: 43 ug/dL (ref 28–170)
Saturation Ratios: 19 % (ref 10.4–31.8)
TIBC: 221 ug/dL — ABNORMAL LOW (ref 250–450)
UIBC: 178 ug/dL

## 2022-10-29 LAB — MAGNESIUM: Magnesium: 1.9 mg/dL (ref 1.7–2.4)

## 2022-10-29 LAB — VITAMIN B12: Vitamin B-12: 643 pg/mL (ref 180–914)

## 2022-10-29 LAB — UREA NITROGEN, URINE: Urea Nitrogen, Ur: 334 mg/dL

## 2022-10-29 LAB — VITAMIN D 25 HYDROXY (VIT D DEFICIENCY, FRACTURES): Vit D, 25-Hydroxy: 20.74 ng/mL — ABNORMAL LOW (ref 30–100)

## 2022-10-29 LAB — CK: Total CK: 243 U/L — ABNORMAL HIGH (ref 38–234)

## 2022-10-29 LAB — T4, FREE: Free T4: 1.37 ng/dL — ABNORMAL HIGH (ref 0.61–1.12)

## 2022-10-29 LAB — FOLATE: Folate: 6.2 ng/mL (ref >5.9)

## 2022-10-29 LAB — TSH: TSH: 0.34 u[IU]/mL — ABNORMAL LOW (ref 0.350–4.500)

## 2022-10-29 LAB — HIV ANTIBODY (ROUTINE TESTING W REFLEX): HIV Screen 4th Generation wRfx: NONREACTIVE

## 2022-10-29 MED ORDER — SODIUM CHLORIDE 0.9 % IV SOLN: INTRAVENOUS | Status: DC

## 2022-10-29 MED ORDER — SODIUM CHLORIDE 1 G PO TABS
1.0000 g | ORAL_TABLET | Freq: Three times a day (TID) | ORAL | Status: DC
  Administered 2022-10-29 – 2022-10-31 (×6): 1 g via ORAL
  Filled 2022-10-29 (×6): qty 1

## 2022-10-29 MED ORDER — FOLIC ACID 1 MG PO TABS
1.0000 mg | ORAL_TABLET | Freq: Every day | ORAL | Status: DC
  Administered 2022-10-29 – 2022-11-06 (×9): 1 mg via ORAL
  Filled 2022-10-29 (×9): qty 1

## 2022-10-29 MED ORDER — VITAMIN D (ERGOCALCIFEROL) 1.25 MG (50000 UNIT) PO CAPS
50000.0000 [IU] | ORAL_CAPSULE | ORAL | Status: DC
  Administered 2022-10-29 – 2022-11-05 (×2): 50000 [IU] via ORAL
  Filled 2022-10-29 (×2): qty 1

## 2022-10-29 NOTE — Consult Note (Signed)
ORTHOPAEDIC CONSULTATION  REQUESTING PHYSICIAN: Val Riles, MD  Chief Complaint:   Right shoulder pain  History of Present Illness: Anne Phillips is a 72 y.o. female with multiple medical problems including hypertension, coronary artery disease, breast cancer, hyperlipidemia, and hypothyroidism who was brought to the emergency room by EMS for altered mental status changes.  The patient has been at an assisted living facility since being discharged 12 days ago after hospitalization for confusion and hypothyroidism.  According the patient, she fell several days ago and injured her shoulder.  X-rays and subsequent CT scanning in the emergency room demonstrated a displaced distal third shaft fracture of the right clavicle, as well as evidence of an old fracture of the greater tuberosity of the right shoulder.  Therefore, orthopedic consultation was requested while the patient has been admitted for treatment of her hyponatremia and mental status changes.  Past Medical History:  Diagnosis Date   Cancer [facility])    Coronary artery disease    Hyperlipidemia    Hypertension    Seizures ([place])    Slurred speech 10/11/2022   Thyroid disease    Past Surgical History:  Procedure Laterality Date   MASTECTOMY Left    Social History   Socioeconomic History   Marital status: Single    Spouse name: Not on file   Number of children: Not on file   Years of education: Not on file   Highest education level: Not on file  Occupational History   Not on file  Tobacco Use   Smoking status: Every Day    Packs/day: 1.00    Types: Cigarettes   Smokeless tobacco: Never  Vaping Use   Vaping Use: Never used  Substance and Sexual Activity   Alcohol use: No   Drug use: Never   Sexual activity: Not on file  Other Topics Concern   Not on file  Social History Narrative   Not on file   Social Determinants of Health   Financial Resource  Strain: Not on file  Food Insecurity: No Food Insecurity (10/28/2022)   Hunger Vital Sign    Worried About Running Out of Food in the Last Year: Never true    Ran Out of Food in the Last Year: Never true  Transportation Needs: No Transportation Needs (10/28/2022)   PRAPARE - Hydrologist (Medical): No    Lack of Transportation (Non-Medical): No  Physical Activity: Not on file  Stress: Not on file  Social Connections: Not on file   Family History  Problem Relation Age of Onset   Hypertension Other    Allergies  Allergen Reactions   Iodinated Contrast Media Hives and Other (See Comments)    Rash    Other Hives    Metal-reaction=rash Metal-reaction=rash    Prior to Admission medications   Medication Sig Start Date End Date Taking? Authorizing Provider  acetaminophen (TYLENOL) 325 MG tablet Take 2 tablets (650 mg total) by mouth every 6 (six) hours as needed for mild pain (or Fever >/= 101). 10/05/20  Yes Domenic Polite, MD  aspirin EC 81 MG tablet Take 1 tablet (81 mg total) by mouth daily. Swallow whole. 10/18/22  Yes Max Sane, MD  atorvastatin (LIPITOR) 40 MG tablet Take 40 mg by mouth daily.   Yes [provider]  carvedilol (COREG) 6.25 MG tablet Take 1 tablet (6.25 mg total) by mouth 2 (two) times daily with a meal. 08/06/21  Yes Sharen Hones, MD  empagliflozin (JARDIANCE) 10 MG  TABS tablet Take 10 mg by mouth daily.   Yes [provider]  furosemide (LASIX) 20 MG tablet Take 1 tablet (20 mg total) by mouth daily. 10/27/21  Yes Fritzi Mandes, MD  gabapentin (NEURONTIN) 300 MG capsule Take 1 capsule (300 mg total) by mouth 2 (two) times daily. 10/05/20  Yes Domenic Polite, MD  Lacosamide (VIMPAT) 150 MG TABS Take 1 tablet (150 mg total) by mouth in the morning and at bedtime. 08/06/21  Yes Sharen Hones, MD  levETIRAcetam (KEPPRA) 750 MG tablet Take 1 tablet (750 mg total) by mouth 2 (two) times daily. 10/17/22  Yes Max Sane, MD  losartan  (COZAAR) 100 MG tablet Take 100 mg by mouth daily. 08/19/22  Yes [provider]  Multiple Vitamin (MULTIVITAMIN WITH MINERALS) TABS tablet Take 1 tablet by mouth daily. 10/18/22  Yes Max Sane, MD  nicotine (NICODERM CQ - DOSED IN MG/24 HOURS) 14 mg/24hr patch Place 1 patch (14 mg total) onto the skin daily. 10/18/22  Yes Max Sane, MD  vitamin B-12 1000 MCG tablet Take 1 tablet (1,000 mcg total) by mouth daily. 08/06/21  Yes Sharen Hones, MD  feeding supplement (ENSURE ENLIVE / ENSURE PLUS) LIQD Take 237 mLs by mouth 2 (two) times daily between meals. 10/17/22   Max Sane, MD  nitroGLYCERIN (NITROSTAT) 0.4 MG SL tablet Place under the tongue. 05/26/22   [provider]  tacrolimus (PROTOPIC) 0.1 % ointment Apply 1 Application topically daily as needed. 07/29/22   [provider]   CT Shoulder Right Wo Contrast  Result Date: 10/28/2022 CLINICAL DATA:  Shoulder pain since falling 2 days ago. Comminuted right clavicle fracture. EXAM: CT OF THE UPPER RIGHT EXTREMITY WITHOUT CONTRAST TECHNIQUE: Multidetector CT imaging of the right shoulder was performed according to the standard protocol. RADIATION DOSE REDUCTION: This exam was performed according to the departmental dose-optimization program which includes automated exposure control, adjustment of the mA and/or kV according to patient size and/or use of iterative reconstruction technique. COMPARISON:  Radiographs 10/28/2022 and 10/03/2020. FINDINGS: Bones/Joint/Cartilage There is a comminuted fracture of the distal 3rd of the right clavicular shaft. This fracture is associated with 1 shaft with of inferior displacement of the distal fragment and 2.6 cm of overriding of the fracture fragments. The acromioclavicular joint is intact. The sternoclavicular joint is not imaged. The humeral head is located. There is deformity of the greater tuberosity which may relate to an old fracture as correlated with previous radiographs. A mild  acute superimposed impaction fracture is difficult to exclude. No evidence of acute scapular fracture. No significant shoulder joint effusion. Ligaments Suboptimally assessed by CT. Muscles and Tendons No focal rotator cuff muscular atrophy or hematoma identified. No gross rotator cuff impingement. Soft tissues There is moderate soft tissue swelling/hemorrhage surrounding the comminuted clavicle fracture. No focal hematoma or other fluid collection. No evidence of foreign body or soft tissue emphysema. No evidence of acute vascular injury. IMPRESSION: 1. Comminuted, displaced and overriding fracture of the distal 3rd of the right clavicular shaft with moderate surrounding soft tissue swelling. 2. Deformity of the greater tuberosity may relate to an old fracture as correlated with previous radiographs. A mild superimposed acute impaction fracture is difficult to exclude. 3. No evidence of acute scapular fracture. 4. No evidence of acute vascular injury. Electronically Signed   By: Richardean Sale M.D.   On: 10/28/2022 13:57   DG Chest 2 View  Result Date: 10/28/2022 CLINICAL DATA:  Provided history: Fall.  Confusion. EXAM: CHEST -  2 VIEW COMPARISON:  Prior chest radiographs 10/25/2021 and earlier. Thyroid ultrasound 08/03/2021. FINDINGS: Heart size at the upper limits of normal. Aortic atherosclerosis. Left basilar atelectasis on the PA radiograph, not well appreciated on the lateral view radiograph. No appreciable airspace consolidation on the right. No sizable pleural effusion or evidence of pneumothorax. Acute, displaced fracture of the mid to distal right clavicle. Thyromegaly with leftward displacement of the trachea. IMPRESSION: 1. Acute, displaced fracture of the mid-to-distal right clavicle. 2. Left basilar atelectasis present on the PA radiograph, but not well appreciated on the subsequent lateral view radiograph. 3. Thyromegaly with leftward displacement of the trachea. 4. Aortic Atherosclerosis  (ICD10-I70.0). Electronically Signed   By: Kellie Simmering D.O.   On: 10/28/2022 12:13   DG Shoulder Right  Result Date: 10/28/2022 CLINICAL DATA:  Fall, pain EXAM: RIGHT SHOULDER - 2+ VIEW COMPARISON:  None Available. FINDINGS: Suspect a minimally displaced fracture of the greater tuberosity of the right humerus. Displaced and foreshortened oblique fracture of the distal third of the right clavicle. Mild glenohumeral and acromioclavicular arthrosis. Partially imaged right chest unremarkable. IMPRESSION: 1. Suspect a minimally displaced fracture of the greater tuberosity of the right humerus. 2. Displaced and foreshortened oblique fracture of the distal third of the right clavicle. 3. Consider CT to further evaluate fracture anatomy. Electronically Signed   By: Delanna Ahmadi M.D.   On: 10/28/2022 12:13   DG Knee 2 Views Right  Result Date: 10/28/2022 CLINICAL DATA:  Fall.  Right knee pain. EXAM: RIGHT KNEE - 1-2 VIEW COMPARISON:  None Available. FINDINGS: Two view study. No acute fracture or dislocation evident. No joint effusion. Bones are diffusely demineralized. Loss of joint space noted medial and lateral compartments with prominent hypertrophic spurring. IMPRESSION: 1. No acute bony findings. 2. Degenerative changes in the medial and lateral compartments. Electronically Signed   By: Misty Stanley M.D.   On: 10/28/2022 12:09   CT Head Wo Contrast  Result Date: 10/28/2022 CLINICAL DATA:  Minor head trauma EXAM: CT HEAD WITHOUT CONTRAST CT CERVICAL SPINE WITHOUT CONTRAST TECHNIQUE: Multidetector CT imaging of the head and cervical spine was performed following the standard protocol without intravenous contrast. Multiplanar CT image reconstructions of the cervical spine were also generated. RADIATION DOSE REDUCTION: This exam was performed according to the departmental dose-optimization program which includes automated exposure control, adjustment of the mA and/or kV according to patient size and/or use of  iterative reconstruction technique. COMPARISON:  10/15/2022 FINDINGS: CT HEAD FINDINGS Brain: No evidence of acute infarction, hemorrhage, hydrocephalus, extra-axial collection or mass lesion/mass effect. Generalized atrophy. Chronic small vessel ischemic low-density in the cerebral white matter. 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 Vascular: No hyperdense vessel or unexpected calcification. Skull: Right anterior scalp swelling and hemorrhage. No acute fracture Sinuses/Orbits: Negative CT CERVICAL SPINE FINDINGS Alignment: Normal. Skull base and vertebrae: No acute fracture. No primary bone lesion or focal pathologic process. Soft tissues and spinal canal: No prevertebral fluid or swelling. No visible canal hematoma. Known multinodular goiter. No follow-up recommended unless clinically warranted (ref: J Am Coll Radiol. 2015 Feb;12(2): 143-50). Disc levels:  Ordinary degenerative change Upper chest: Partially covered right mid clavicle fracture with adjacent soft tissue swelling. IMPRESSION: 1. No evidence of acute intracranial or cervical spine injury. 2. Scalp hematoma without calvarial fracture. 3. Acute right clavicle fracture. 4. Multiple multinodular goiter, reference thyroid ultrasound with recommendation 08/03/2021. Electronically Signed   By: Jorje Guild M.D.   On: 10/28/2022 12:01   CT Cervical Spine Wo  Contrast  Result Date: 10/28/2022 CLINICAL DATA:  Minor head trauma EXAM: CT HEAD WITHOUT CONTRAST CT CERVICAL SPINE WITHOUT CONTRAST TECHNIQUE: Multidetector CT imaging of the head and cervical spine was performed following the standard protocol without intravenous contrast. Multiplanar CT image reconstructions of the cervical spine were also generated. RADIATION DOSE REDUCTION: This exam was performed according to the departmental dose-optimization program which includes automated exposure control, adjustment of the mA and/or kV according to patient size and/or use of iterative  reconstruction technique. COMPARISON:  10/15/2022 FINDINGS: CT HEAD FINDINGS Brain: No evidence of acute infarction, hemorrhage, hydrocephalus, extra-axial collection or mass lesion/mass effect. Generalized atrophy. Chronic small vessel ischemic low-density in the cerebral white matter. 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 Vascular: No hyperdense vessel or unexpected calcification. Skull: Right anterior scalp swelling and hemorrhage. No acute fracture Sinuses/Orbits: Negative CT CERVICAL SPINE FINDINGS Alignment: Normal. Skull base and vertebrae: No acute fracture. No primary bone lesion or focal pathologic process. Soft tissues and spinal canal: No prevertebral fluid or swelling. No visible canal hematoma. Known multinodular goiter. No follow-up recommended unless clinically warranted (ref: J Am Coll Radiol. 2015 Feb;12(2): 143-50). Disc levels:  Ordinary degenerative change Upper chest: Partially covered right mid clavicle fracture with adjacent soft tissue swelling. IMPRESSION: 1. No evidence of acute intracranial or cervical spine injury. 2. Scalp hematoma without calvarial fracture. 3. Acute right clavicle fracture. 4. Multiple multinodular goiter, reference thyroid ultrasound with recommendation 08/03/2021. Electronically Signed   By: Jorje Guild M.D.   On: 10/28/2022 12:01    Positive ROS: All other systems have been reviewed and were otherwise negative with the exception of those mentioned in the HPI and as above.  Physical Exam: General:  Alert, no acute distress Psychiatric:  Patient confused and is not competent for consent, but exhibits normal mood and affect   Cardiovascular:  No pedal edema Respiratory:  No wheezing, non-labored breathing GI:  Abdomen is soft and non-tender Skin:  No lesions in the area of chief complaint Neurologic:  Sensation intact distally Lymphatic:  No axillary or cervical lymphadenopathy  Orthopedic Exam:  Orthopedic examination is limited to  the right shoulder and upper extremity.  There is mild swelling over the anterior superior aspect of the shoulder, especially over the distal clavicle region.  She has mild-moderate tenderness to palpation over this area but no obvious step-off in the bone is noted, presumably due to the swelling.  She is no longer in her sling and is using her arm quite actively without too much difficulty or discomfort.  She is grossly neurovascularly intact to the right upper extremity and hand.  X-rays:  Recent x-rays and CT scans of the right shoulder are available for review and have been reviewed by myself.  These films demonstrate a 2-part displaced right distal clavicle fracture, as well as evidence of an old injury to the greater tuberosity region of her right proximal humerus.  The shoulder is properly located and no significant degenerative changes of the glenohumeral joint are noted..  Assessment: Displaced 2 part distal third right clavicle fracture.  Plan: The treatment options have been discussed with the patient.  This injury is most commonly managed nonsurgically with overall excellent functional results.  Given the patient's confusion, I do not think that she would be able to follow proper postoperative instructions if surgery was contemplated in her case.  Therefore, I feel that nonsurgical management is the appropriate direction to take at this time.  If she will stay in a  shoulder immobilizer, that would be optimal, but she does not seem to have too much discomfort with moving her shoulder this morning, so she may do just fine without a shoulder immobilizer.  Regardless, she should not be trying to weight-bear through her right upper extremity and hand.  Thank you for asking me to participate in the care of this unfortunate woman.  I will sign off at this time.  Please have her follow-up in our office in 3 to 4 weeks for repeat x-rays.  If you have further need of orthopedic input during this  hospitalization, please reconsult me.   Pascal Lux, MD  Beeper #:  [phone]  10/29/2022 7:29 AM

## 2022-10-29 NOTE — TOC Progression Note (Signed)
Transition of Care [facility]) - Progression Note    Patient Details  Name: Anne Phillips MRN: [id number] Date of Birth: Oct 12, 1950  [facility]) CM/SW Contact  Carol Ada, RN Phone Number: 10/29/2022, 4:50 PM  Clinical Narrative:   Phoebe Perch completed and sented to Medical provider for signature.  I spoke with patient's sister Orion Modest and she reports that patient was at [facility] for [employer] and would like for Mrs. Carmer to return on discharge.           Expected Discharge Plan and Services                                               Social Determinants of Health (SDOH) Interventions SDOH Screenings   Food Insecurity: No Food Insecurity (10/28/2022)  Housing: Low Risk  (10/28/2022)  Transportation Needs: No Transportation Needs (10/28/2022)  Utilities: Not At Risk (10/28/2022)  Tobacco Use: High Risk (10/28/2022)    Readmission Risk Interventions    10/12/2022   11:51 AM  Readmission Risk Prevention Plan  PCP or Specialist Appt within 3-5 Days Complete  Social Work Consult for [place] Planning/Counseling Complete  Palliative Care Screening Not Applicable

## 2022-10-29 NOTE — Progress Notes (Signed)
[facility] Progress Note  Patient: Anne Phillips    [id number]  DOA: 10/28/2022     Date of Service: the patient was seen and examined on 10/29/2022  Chief Complaint  Patient presents with   Altered Mental Status   Brief hospital course: Anne Phillips is a 72 y.o. female with medical history significant of seizure disorder, hypertension, hyperlipidemia, CAD, breast cancer s/p mastectomy, hypothyroidism, who presents to the ED with complaints of altered mental status.  Patient is very poor historian, she fell couple of days ago, does not remember the details, she had fracture of right clavicle.  Patient has remote history of seizures.  As per patient's sister patient has been confused recently. ED workup: Hyponatremia sodium 124, creatinine 1.23 slightly elevated, CK2 43 CT right shoulder: Comminuted, displaced and overriding fracture of the distal 3rd of the right clavicular shaft with moderate surrounding soft tissue swelling. 2. Deformity of the greater tuberosity may relate to an old fracture as correlated with previous radiographs. A mild superimposed acute impaction fracture is difficult to exclude. TR[facility] contacted for admission for symptomatic hyponatremia.   Assessment and Plan:  # Hyponatremia, unknown cause could be SIADH versus hypovolemic hyponatremia Sodium 122, serum osmolality 261 low, currently, urine 70-91 S/p 500 mL NS bolus, Na 124  Continue to monitor sodium level daily Nephrology consulted, no indication for 3% saline at this time 2/28 started sodium chloride tablets 1 g p.o. 3 times daily   # Acute metabolic encephalopathy could be secondary to hyponatremia Continue fall precautions and seizure precautions Continue supportive care   # Seizure disorder Patient has history of generalized tonic-clonic seizures as well as focal seizures with left arm jerking. Continue Keppra and Vimpat Continue seizure precautions Case was discussed with neurology,  recommended continue current treatment, if patient is having breakthrough episodes then we could give extra dose of Vimpat 100 mg and increase dose of Vimpat to 200 mg twice daily   Right clavicle fracture and deformity of greater tuberosity Continue as needed medication for pain control Immobilized right shoulder and nonweightbearing Seen by orthopedic surgery, recommended nonsurgical management  CKD stage IIIb Currently renal which is at baseline, continue to monitor.  Hypertension, HFpEF, no signs of volume overload at this time Held Lasix for now Continued Coreg and losartan Monitor BP and titrate medications accordingly   Anemia of chronic disease Hb 10.0-28.2, continue monitor Iron studies within normal range, folic acid 6.2 at lower end, B12 within normal range Started folic acid 1 mg p.o. daily   Vitamin D deficiency: started vitamin D 50,000 units p.o. weekly, follow with PCP to repeat vitamin D level after 3 to 6 months.   Abnormal TSH, TSH 0.34 which is low Check free T4 level and free T3 level   Body mass index is 22.48 kg/m.  Interventions:       Diet: Regular diet DVT Prophylaxis: Subcutaneous Lovenox   Advance goals of care discussion: Full code  Family Communication: family was present at bedside, at the time of interview.  The pt provided permission to discuss medical plan with the family. Opportunity was given to ask question and all questions were answered satisfactorily.   Disposition:  Pt is from Home, admitted with hyponatremia and AMS, still has low sodium, which precludes a safe discharge. Discharge to home, when stable and cleared by nephrology..  Subjective: No significant overnight events, patient denies any complaints, no significant pain in the right shoulder, no chest pain or palpitation no shortness of  breath.  Physical Exam: General: NAD, lying comfortably Appear in no distress, affect appropriate Eyes: PERRLA ENT: Oral Mucosa  Clear, moist  Neck: no JVD,  Cardiovascular: S1 and S2 Present, no Murmur,  Respiratory: good respiratory effort, Bilateral Air entry equal and Decreased, no Crackles, no wheezes Abdomen: Bowel Sound present, Soft and no tenderness,  Skin: no rashes Extremities: no Pedal edema, no calf tenderness Neurologic: without any new focal findings Gait not checked due to patient safety concerns  Vitals:   10/28/22 1400 10/28/22 1415 10/28/22 1510 10/28/22 2324  BP: 123/64  (!) 140/83 139/66  Pulse: (!) 58 60 (!) 55 63  Resp: '19 20 15   '$ Temp: 98 F (36.7 C)  98 F (36.7 C) 98.1 F (36.7 C)  TempSrc: Oral     SpO2: 100% 95% 99% 100%  Weight:      Height:        Intake/Output Summary (Last 24 hours) at 10/29/2022 1554 Last data filed at 10/29/2022 1028 Gross per 24 hour  Intake 720 ml  Output 400 ml  Net 320 ml   Filed Weights   10/28/22 1057  Weight: 54 kg    Data Reviewed: I have personally reviewed and interpreted daily labs, tele strips, imagings as discussed above. I reviewed all nursing notes, pharmacy notes, vitals, pertinent old records I have discussed plan of care as described above with RN and patient/family.  CBC: Recent Labs  Lab 10/28/22 1235 10/29/22 0326  WBC 7.1 6.8  NEUTROABS 5.9 4.9  HGB 10.0* 8.2*  HCT 30.2* 24.2*  MCV 85.3 84.0  PLT 223 [id number]   Basic Metabolic Panel: Recent Labs  Lab 10/28/22 1235 10/28/22 1643 10/28/22 2351 10/29/22 0917  NA 122* 124* 124* 123*  K 4.2 4.1 3.5 3.9  CL 87* 89* 91* 93*  CO2 '23 23 22 23  '$ GLUCOSE 110* 110* 102* 109*  BUN '18 17 14 15  '$ CREATININE 1.25* 1.23* 1.12* 1.15*  CALCIUM 9.2 8.9 8.6* 8.6*  MG  --   --   --  1.9  PHOS  --   --   --  3.0    Studies: No results found.  Scheduled Meds:  aspirin EC  81 mg Oral Daily   atorvastatin  40 mg Oral Daily   carvedilol  6.25 mg Oral BID WC   enoxaparin (LOVENOX) injection  40 mg Subcutaneous Q24H   gabapentin  300 mg Oral BID   lacosamide  150 mg Oral BID    levETIRAcetam  750 mg Oral BID   losartan  100 mg Oral Daily   sodium chloride flush  3 mL Intravenous Q12H   sodium chloride  1 g Oral TID WC   Continuous Infusions: PRN Meds: acetaminophen **OR** acetaminophen, ondansetron **OR** ondansetron (ZOFRAN) IV, oxyCODONE  Time spent: 50 minutes  Author: Val Riles. MD [facility] 10/29/2022 3:54 PM  To reach On-call, see care teams to locate the attending and reach out to them via [email or web address]. If 7PM-7AM, please contact night-coverage If you still have difficulty reaching the attending provider, please page the [facility] (Director on Call) for [facility] on amion for assistance.
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
## 2022-10-29 NOTE — Plan of Care (Signed)
  Problem: Ischemic Stroke/TIA Tissue Perfusion: Goal: Complications of ischemic stroke/TIA will be minimized Outcome: Progressing   Problem: Coping: Goal: Will verbalize positive feelings about self Outcome: Progressing   Problem: Coping: Goal: Will identify appropriate support needs Outcome: Progressing   Problem: Health Behavior/Discharge Planning: Goal: Goals will be collaboratively established with patient/family Outcome: Progressing   Problem: Self-Care: Goal: Ability to participate in self-care as condition permits will improve Outcome: Progressing   Problem: Self-Care: Goal: Ability to communicate needs accurately will improve Outcome: Progressing

## 2022-10-29 NOTE — Progress Notes (Signed)
[facility]  ROUNDING NOTE   Subjective:   [name] is a 72 y.o.  female with past medical history including hypertension, CAD, seizure disorder, breast cancer with mastectomy, and hypothyroidism.  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  Patient was treated with tolvaptan during that admission and responded appropriately.  Patient found to have an admission sodium of 122.  Patient sodium level at discharge on 10/17/2022 was 129.  Patient is seen resting in bed, no family or visitors at bedside.  Patient states she currently needs to use the restroom.  Patient states her appetite has remained appropriate.  Reports intermittent episodes of nausea and vomiting.  Denies diarrhea.  Labs on ED arrival significant for sodium 122, creatinine 1.25 with GFR 46, serum osmolality 262, and hemoglobin 10.2.  UA appears cloudy with moderate leukocytes and few bacteria.  Right shoulder CT shows a mild superimposed acute impaction fracture.  We have been consulted to assist in management of hyponatremia.  Objective:  Vital signs in last 24 hours:  Temp:  [98 F (36.7 C)-98.1 F (36.7 C)] 98.1 F (36.7 C) (02/27 2324) Pulse Rate:  [55-63] 63 (02/27 2324) Resp:  [13-20] 15 (02/27 1510) BP: (112-140)/(64-83) 139/66 (02/27 2324) SpO2:  [95 %-100 %] 100 % (02/27 2324)  Weight change:  Filed Weights   10/28/22 1057  Weight: 54 kg    Intake/Output: I/O last 3 completed shifts: In: 500 [IV Piggyback:500] Out: 400 [Urine:400]   Intake/Output this shift:  Total I/O In: 220 [P.O.:220] Out: -   Physical Exam: General: NAD  Head: Normocephalic, atraumatic. Moist oral  mucosal membranes  Eyes: Anicteric  Lungs:  Clear to auscultation, normal effort, room air  Heart: Regular rate and rhythm  Abdomen:  Soft, nontender, nondistended  Extremities: No peripheral edema.  Neurologic: Alert and oriented, moving all four extremities  Skin: No lesions  Access: None    Basic Metabolic Panel: Recent Labs  Lab 10/28/22 1235 10/28/22 1643 10/28/22 2351 10/29/22 0917  NA 122* 124* 124* 123*  K 4.2 4.1 3.5 3.9  CL 87* 89* 91* 93*  CO2 '23 23 22 23  '$ GLUCOSE 110* 110* 102* 109*  BUN '18 17 14 15  '$ CREATININE 1.25* 1.23* 1.12* 1.15*  CALCIUM 9.2 8.9 8.6* 8.6*  MG  --   --   --  1.9  PHOS  --   --   --  3.0    Liver Function Tests: Recent Labs  Lab 10/28/22 1235  AST 39  ALT 13  ALKPHOS 137*  BILITOT 1.4*  PROT 8.1  ALBUMIN 4.2   No results for input(s): "LIPASE", "AMYLASE" in the last 168 hours. No results for input(s): "AMMONIA" in the last 168 hours.  CBC: Recent Labs  Lab 10/28/22 1235 10/29/22 0326  WBC 7.1 6.8  NEUTROABS 5.9 4.9  HGB 10.0* 8.2*  HCT 30.2* 24.2*  MCV 85.3 84.0  PLT 223 205    Cardiac Enzymes: Recent Labs  Lab 10/29/22 0917  CKTOTAL 243*    BNP: Invalid input(s): "POCBNP"  CBG: No results for input(s): "GLUCAP" in the last 168 hours.  Microbiology: Results for orders placed or performed during the hospital encounter of 10/28/22  MRSA Next Gen by PCR, Nasal  Status: None   Collection Time: 10/28/22  4:00 PM   Specimen: Nasal Mucosa; Nasal Swab  Result Value Ref Range Status   MRSA by PCR Next Gen NOT DETECTED NOT DETECTED Final    Comment: (NOTE) The GeneXpert MRSA Assay (FDA approved for NASAL specimens only), is one component of a comprehensive MRSA colonization surveillance program. It is not intended to diagnose MRSA infection nor to guide or monitor treatment for MRSA infections. Test performance is not FDA approved in patients less than 95 years old. Performed at [facility], [address] [place], [place] [address]     Coagulation Studies: No results for input(s): "LABPROT", "INR" in the last 72 hours.  Urinalysis: Recent Labs    10/28/22 1313  COLORURINE YELLOW*  LABSPEC 1.011  PHURINE 5.0  GLUCOSEU 150*  HGBUR NEGATIVE  BILIRUBINUR NEGATIVE  KETONESUR NEGATIVE  PROTEINUR NEGATIVE  NITRITE NEGATIVE  LEUKOCYTESUR MODERATE*      Imaging: CT Shoulder Right Wo Contrast  Result Date: 10/28/2022 CLINICAL DATA:  Shoulder pain since falling 2 days ago. Comminuted right clavicle fracture. EXAM: CT OF THE UPPER RIGHT EXTREMITY WITHOUT CONTRAST TECHNIQUE: Multidetector CT imaging of the right shoulder was performed according to the standard protocol. RADIATION DOSE REDUCTION: This exam was performed according to the departmental dose-optimization program which includes automated exposure control, adjustment of the mA and/or kV according to patient size and/or use of iterative reconstruction technique. COMPARISON:  Radiographs 10/28/2022 and 10/03/2020. FINDINGS: Bones/Joint/Cartilage There is a comminuted fracture of the distal 3rd of the right clavicular shaft. This fracture is associated with 1 shaft with of inferior displacement of the distal fragment and 2.6 cm of overriding of the fracture fragments. The acromioclavicular joint is intact. The sternoclavicular joint is not imaged. The humeral head is located. There is deformity of the greater tuberosity which may relate to an old fracture as correlated with previous radiographs. A mild acute superimposed impaction fracture is difficult to exclude. No evidence of acute scapular fracture. No significant shoulder joint effusion. Ligaments Suboptimally assessed by CT. Muscles and Tendons No focal rotator cuff muscular atrophy or hematoma identified. No gross rotator cuff impingement. Soft tissues There is moderate soft tissue swelling/hemorrhage surrounding the comminuted clavicle fracture. No focal hematoma or other  fluid collection. No evidence of foreign body or soft tissue emphysema. No evidence of acute vascular injury. IMPRESSION: 1. Comminuted, displaced and overriding fracture of the distal 3rd of the right clavicular shaft with moderate surrounding soft tissue swelling. 2. Deformity of the greater tuberosity may relate to an old fracture as correlated with previous radiographs. A mild superimposed acute impaction fracture is difficult to exclude. 3. No evidence of acute scapular fracture. 4. No evidence of acute vascular injury. Electronically Signed   By: Richardean Sale M.D.   On: 10/28/2022 13:57   DG Chest 2 View  Result Date: 10/28/2022 CLINICAL DATA:  Provided history: Fall.  Confusion. EXAM: CHEST - 2 VIEW COMPARISON:  Prior chest radiographs 10/25/2021 and earlier. Thyroid ultrasound 08/03/2021. FINDINGS: Heart size at the upper limits of normal. Aortic atherosclerosis. Left basilar atelectasis on the PA radiograph, not well appreciated on the lateral view radiograph. No appreciable airspace consolidation on the right. No sizable pleural effusion or evidence of pneumothorax. Acute, displaced fracture of the mid to distal right clavicle. Thyromegaly with leftward displacement of the trachea. IMPRESSION: 1. Acute, displaced fracture of the mid-to-distal right clavicle. 2. Left basilar atelectasis present on the PA radiograph, but not well appreciated on the  subsequent lateral view radiograph. 3. Thyromegaly with leftward displacement of the trachea. 4. Aortic Atherosclerosis (ICD10-I70.0). Electronically Signed   By: Kellie Simmering D.O.   On: 10/28/2022 12:13   DG Shoulder Right  Result Date: 10/28/2022 CLINICAL DATA:  Fall, pain EXAM: RIGHT SHOULDER - 2+ VIEW COMPARISON:  None Available. FINDINGS: Suspect a minimally displaced fracture of the greater tuberosity of the right humerus. Displaced and foreshortened oblique fracture of the distal third of the right clavicle. Mild glenohumeral and  acromioclavicular arthrosis. Partially imaged right chest unremarkable. IMPRESSION: 1. Suspect a minimally displaced fracture of the greater tuberosity of the right humerus. 2. Displaced and foreshortened oblique fracture of the distal third of the right clavicle. 3. Consider CT to further evaluate fracture anatomy. Electronically Signed   By: Delanna Ahmadi M.D.   On: 10/28/2022 12:13   DG Knee 2 Views Right  Result Date: 10/28/2022 CLINICAL DATA:  Fall.  Right knee pain. EXAM: RIGHT KNEE - 1-2 VIEW COMPARISON:  None Available. FINDINGS: Two view study. No acute fracture or dislocation evident. No joint effusion. Bones are diffusely demineralized. Loss of joint space noted medial and lateral compartments with prominent hypertrophic spurring. IMPRESSION: 1. No acute bony findings. 2. Degenerative changes in the medial and lateral compartments. Electronically Signed   By: Misty Stanley M.D.   On: 10/28/2022 12:09   CT Head Wo Contrast  Result Date: 10/28/2022 CLINICAL DATA:  Minor head trauma EXAM: CT HEAD WITHOUT CONTRAST CT CERVICAL SPINE WITHOUT CONTRAST TECHNIQUE: Multidetector CT imaging of the head and cervical spine was performed following the standard protocol without intravenous contrast. Multiplanar CT image reconstructions of the cervical spine were also generated. RADIATION DOSE REDUCTION: This exam was performed according to the departmental dose-optimization program which includes automated exposure control, adjustment of the mA and/or kV according to patient size and/or use of iterative reconstruction technique. COMPARISON:  10/15/2022 FINDINGS: CT HEAD FINDINGS Brain: No evidence of acute infarction, hemorrhage, hydrocephalus, extra-axial collection or mass lesion/mass effect. Generalized atrophy. Chronic small vessel ischemic low-density in the cerebral white matter. 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 Vascular: No hyperdense vessel or unexpected calcification. Skull: Right  anterior scalp swelling and hemorrhage. No acute fracture Sinuses/Orbits: Negative CT CERVICAL SPINE FINDINGS Alignment: Normal. Skull base and vertebrae: No acute fracture. No primary bone lesion or focal pathologic process. Soft tissues and spinal canal: No prevertebral fluid or swelling. No visible canal hematoma. Known multinodular goiter. No follow-up recommended unless clinically warranted (ref: J Am Coll Radiol. 2015 Feb;12(2): 143-50). Disc levels:  Ordinary degenerative change Upper chest: Partially covered right mid clavicle fracture with adjacent soft tissue swelling. IMPRESSION: 1. No evidence of acute intracranial or cervical spine injury. 2. Scalp hematoma without calvarial fracture. 3. Acute right clavicle fracture. 4. Multiple multinodular goiter, reference thyroid ultrasound with recommendation 08/03/2021. Electronically Signed   By: Jorje Guild M.D.   On: 10/28/2022 12:01   CT Cervical Spine Wo Contrast  Result Date: 10/28/2022 CLINICAL DATA:  Minor head trauma EXAM: CT HEAD WITHOUT CONTRAST CT CERVICAL SPINE WITHOUT CONTRAST TECHNIQUE: Multidetector CT imaging of the head and cervical spine was performed following the standard protocol without intravenous contrast. Multiplanar CT image reconstructions of the cervical spine were also generated. RADIATION DOSE REDUCTION: This exam was performed according to the departmental dose-optimization program which includes automated exposure control, adjustment of the mA and/or kV according to patient size and/or use of iterative reconstruction technique. COMPARISON:  10/15/2022 FINDINGS: CT HEAD FINDINGS Brain: No evidence of  acute infarction, hemorrhage, hydrocephalus, extra-axial collection or mass lesion/mass effect. Generalized atrophy. Chronic small vessel ischemic low-density in the cerebral white matter. 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 Vascular: No hyperdense vessel or unexpected calcification. Skull: Right anterior  scalp swelling and hemorrhage. No acute fracture Sinuses/Orbits: Negative CT CERVICAL SPINE FINDINGS Alignment: Normal. Skull base and vertebrae: No acute fracture. No primary bone lesion or focal pathologic process. Soft tissues and spinal canal: No prevertebral fluid or swelling. No visible canal hematoma. Known multinodular goiter. No follow-up recommended unless clinically warranted (ref: J Am Coll Radiol. 2015 Feb;12(2): 143-50). Disc levels:  Ordinary degenerative change Upper chest: Partially covered right mid clavicle fracture with adjacent soft tissue swelling. IMPRESSION: 1. No evidence of acute intracranial or cervical spine injury. 2. Scalp hematoma without calvarial fracture. 3. Acute right clavicle fracture. 4. Multiple multinodular goiter, reference thyroid ultrasound with recommendation 08/03/2021. Electronically Signed   By: Jorje Guild M.D.   On: 10/28/2022 12:01     Medications:     aspirin EC  81 mg Oral Daily   atorvastatin  40 mg Oral Daily   carvedilol  6.25 mg Oral BID WC   enoxaparin (LOVENOX) injection  40 mg Subcutaneous Q24H   gabapentin  300 mg Oral BID   lacosamide  150 mg Oral BID   levETIRAcetam  750 mg Oral BID   losartan  100 mg Oral Daily   sodium chloride flush  3 mL Intravenous Q12H   sodium chloride  1 g Oral TID WC   acetaminophen **OR** acetaminophen, ondansetron **OR** ondansetron (ZOFRAN) IV, oxyCODONE  Assessment/ Plan:  [name] is a 72 y.o.  female with past medical history including hypertension, CAD, seizure disorder, breast cancer with mastectomy, and hypothyroidism.  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  Will perform medication review to  evaluate other contributing factors.  Sodium on admission 122.  Patient has received 2 500 mL normal saline boluses.  Sodium 124 today.  Due to lack of IV, will hold additional IV fluids.  Patient prescribed salt tabs 1 g 3 times daily.  Continue encouragement of oral intake.  2.  Chronic kidney disease stage IIIa.  Patient currently at baseline renal function.  Not currently followed by outpatient nephrology.  Will monitor renal function during this admission.  3. Anemia of chronic kidney disease Lab Results  Component Value Date   HGB 8.2 (L) 10/29/2022    Hemoglobin below acceptable range for renal patient.  Will monitor for now.   LOS: 0   2/28/20241:35 PM

## 2022-10-30 DIAGNOSIS — E871 Hypo-osmolality and hyponatremia: Secondary | ICD-10-CM | POA: Diagnosis not present

## 2022-10-30 LAB — CBC
HCT: 24 % — ABNORMAL LOW (ref 36.0–46.0)
Hemoglobin: 7.9 g/dL — ABNORMAL LOW (ref 12.0–15.0)
MCH: 28 pg (ref 26.0–34.0)
MCHC: 32.9 g/dL (ref 30.0–36.0)
MCV: 85.1 fL (ref 80.0–100.0)
Platelets: 224 10*3/uL (ref 150–400)
RBC: 2.82 MIL/uL — ABNORMAL LOW (ref 3.87–5.11)
RDW: 13.7 % (ref 11.5–15.5)
WBC: 6.2 10*3/uL (ref 4.0–10.5)
nRBC: 0 % (ref 0.0–0.2)

## 2022-10-30 LAB — BASIC METABOLIC PANEL
Anion gap: 10 (ref 5–15)
BUN: 12 mg/dL (ref 8–23)
CO2: 21 mmol/L — ABNORMAL LOW (ref 22–32)
Calcium: 8.7 mg/dL — ABNORMAL LOW (ref 8.9–10.3)
Chloride: 97 mmol/L — ABNORMAL LOW (ref 98–111)
Creatinine, Ser: 1.04 mg/dL — ABNORMAL HIGH (ref 0.44–1.00)
GFR, Estimated: 58 mL/min — ABNORMAL LOW (ref >60)
Glucose, Bld: 96 mg/dL (ref 70–99)
Potassium: 3.5 mmol/L (ref 3.5–5.1)
Sodium: 128 mmol/L — ABNORMAL LOW (ref 135–145)

## 2022-10-30 LAB — URINALYSIS, W/ REFLEX TO CULTURE (INFECTION SUSPECTED)
Bilirubin Urine: NEGATIVE
Glucose, UA: 150 mg/dL — AB
Hgb urine dipstick: NEGATIVE
Ketones, ur: NEGATIVE mg/dL
Leukocytes,Ua: NEGATIVE
Nitrite: NEGATIVE
Protein, ur: NEGATIVE mg/dL
Specific Gravity, Urine: 1.011 (ref 1.005–1.030)
pH: 6 (ref 5.0–8.0)

## 2022-10-30 LAB — MAGNESIUM: Magnesium: 1.8 mg/dL (ref 1.7–2.4)

## 2022-10-30 LAB — PHOSPHORUS: Phosphorus: 2.8 mg/dL (ref 2.5–4.6)

## 2022-10-30 MED ORDER — METHIMAZOLE 5 MG PO TABS
5.0000 mg | ORAL_TABLET | Freq: Two times a day (BID) | ORAL | Status: AC
  Administered 2022-10-30 – 2022-11-02 (×6): 5 mg via ORAL
  Filled 2022-10-30 (×6): qty 1

## 2022-10-30 MED ORDER — SACCHAROMYCES BOULARDII 250 MG PO CAPS
250.0000 mg | ORAL_CAPSULE | Freq: Two times a day (BID) | ORAL | Status: AC
  Administered 2022-10-30 – 2022-11-04 (×10): 250 mg via ORAL
  Filled 2022-10-30 (×10): qty 1

## 2022-10-30 MED ORDER — METHIMAZOLE 5 MG PO TABS
5.0000 mg | ORAL_TABLET | Freq: Every day | ORAL | Status: DC
  Administered 2022-11-03 – 2022-11-06 (×4): 5 mg via ORAL
  Filled 2022-10-30 (×4): qty 1

## 2022-10-30 MED ORDER — SODIUM CHLORIDE 0.9 % IV SOLN: INTRAVENOUS | Status: DC

## 2022-10-30 MED ORDER — SACCHAROMYCES BOULARDII 250 MG PO CAPS: 250.0000 mg | ORAL_CAPSULE | Freq: Two times a day (BID) | ORAL | Status: DC

## 2022-10-30 NOTE — Progress Notes (Signed)
[facility]  ROUNDING NOTE   Subjective:   [name] is a 72 y.o.  female with past medical history including hypertension, CAD, seizure disorder, breast cancer with mastectomy, and hypothyroidism.  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    Patient seen sitting in bed, alert and oriented Denies pain but lower extremity itching Partially completed breakfast tray   Objective:  Vital signs in last 24 hours:  Temp:  [97.9 F (36.6 C)-98.1 F (36.7 C)] 98 F (36.7 C) (02/29 0756) Pulse Rate:  [65-68] 68 (02/29 0756) Resp:  [16-18] 16 (02/29 0756) BP: (109-147)/(66-75) 147/70 (02/29 0756) SpO2:  [99 %-100 %] 99 % (02/29 0756)  Weight change:  Filed Weights   10/28/22 1057  Weight: 54 kg    Intake/Output: I/O last 3 completed shifts: In: 220 [P.O.:220] Out: 850 [Urine:850]   Intake/Output this shift:  Total I/O In: -  Out: 2 [Urine:2]  Physical Exam: General: NAD  Head: Normocephalic, atraumatic. Moist oral mucosal membranes  Eyes: Anicteric  Lungs:  Clear to auscultation, normal effort, room air  Heart: Regular rate and rhythm  Abdomen:  Soft, nontender, nondistended  Extremities: No peripheral edema.  Neurologic: Alert and oriented, moving all four extremities  Skin: No lesions  Access: None    Basic Metabolic Panel: Recent Labs  Lab 10/28/22 1235 10/28/22 1643 10/28/22 2351 10/29/22 0917 10/30/22 0630  NA 122* 124* 124* 123* 128*  K 4.2 4.1 3.5 3.9 3.5  CL 87* 89* 91* 93* 97*  CO2 '23 23 22 23 '$ 21*  GLUCOSE 110* 110* 102* 109* 96  BUN '18 17 14 15 12  '$ CREATININE 1.25* 1.23* 1.12* 1.15* 1.04*  CALCIUM 9.2 8.9 8.6* 8.6*  8.7*  MG  --   --   --  1.9 1.8  PHOS  --   --   --  3.0 2.8     Liver Function Tests: Recent Labs  Lab 10/28/22 1235  AST 39  ALT 13  ALKPHOS 137*  BILITOT 1.4*  PROT 8.1  ALBUMIN 4.2    No results for input(s): "LIPASE", "AMYLASE" in the last 168 hours. No results for input(s): "AMMONIA" in the last 168 hours.  CBC: Recent Labs  Lab 10/28/22 1235 10/29/22 0326 10/30/22 0630  WBC 7.1 6.8 6.2  NEUTROABS 5.9 4.9  --   HGB 10.0* 8.2* 7.9*  HCT 30.2* 24.2* 24.0*  MCV 85.3 84.0 85.1  PLT 223 205 224     Cardiac Enzymes: Recent Labs  Lab 10/29/22 0917  CKTOTAL 243*     BNP: Invalid input(s): "POCBNP"  CBG: No results for input(s): "GLUCAP" in the last 168 hours.  Microbiology: Results for orders placed or performed during the hospital encounter of 10/28/22  MRSA Next Gen by PCR, Nasal     Status: None   Collection Time: 10/28/22  4:00 PM   Specimen: Nasal Mucosa; Nasal Swab  Result Value Ref Range Status   MRSA by PCR Next Gen NOT DETECTED NOT DETECTED Final    Comment: (NOTE) The GeneXpert MRSA Assay (FDA approved for NASAL specimens only), is one component of a comprehensive MRSA colonization surveillance program. It is not intended to diagnose MRSA infection nor to guide or monitor treatment for MRSA  infections. Test performance is not FDA approved in patients less than 25 years old. Performed at [facility], [address] [place], [place] [address]     Coagulation Studies: No results for input(s): "LABPROT", "INR" in the last 72 hours.  Urinalysis: Recent Labs    10/28/22 1313  COLORURINE YELLOW*  LABSPEC 1.011  PHURINE 5.0  GLUCOSEU 150*  HGBUR NEGATIVE  BILIRUBINUR NEGATIVE  KETONESUR NEGATIVE  PROTEINUR NEGATIVE  NITRITE NEGATIVE  LEUKOCYTESUR MODERATE*       Imaging: CT Shoulder Right Wo Contrast  Result Date: 10/28/2022 CLINICAL DATA:  Shoulder pain since falling 2 days ago. Comminuted right clavicle  fracture. EXAM: CT OF THE UPPER RIGHT EXTREMITY WITHOUT CONTRAST TECHNIQUE: Multidetector CT imaging of the right shoulder was performed according to the standard protocol. RADIATION DOSE REDUCTION: This exam was performed according to the departmental dose-optimization program which includes automated exposure control, adjustment of the mA and/or kV according to patient size and/or use of iterative reconstruction technique. COMPARISON:  Radiographs 10/28/2022 and 10/03/2020. FINDINGS: Bones/Joint/Cartilage There is a comminuted fracture of the distal 3rd of the right clavicular shaft. This fracture is associated with 1 shaft with of inferior displacement of the distal fragment and 2.6 cm of overriding of the fracture fragments. The acromioclavicular joint is intact. The sternoclavicular joint is not imaged. The humeral head is located. There is deformity of the greater tuberosity which may relate to an old fracture as correlated with previous radiographs. A mild acute superimposed impaction fracture is difficult to exclude. No evidence of acute scapular fracture. No significant shoulder joint effusion. Ligaments Suboptimally assessed by CT. Muscles and Tendons No focal rotator cuff muscular atrophy or hematoma identified. No gross rotator cuff impingement. Soft tissues There is moderate soft tissue swelling/hemorrhage surrounding the comminuted clavicle fracture. No focal hematoma or other fluid collection. No evidence of foreign body or soft tissue emphysema. No evidence of acute vascular injury. IMPRESSION: 1. Comminuted, displaced and overriding fracture of the distal 3rd of the right clavicular shaft with moderate surrounding soft tissue swelling. 2. Deformity of the greater tuberosity may relate to an old fracture as correlated with previous radiographs. A mild superimposed acute impaction fracture is difficult to exclude. 3. No evidence of acute scapular fracture. 4. No evidence of acute vascular injury.  Electronically Signed   By: Richardean Sale M.D.   On: 10/28/2022 13:57     Medications:     aspirin EC  81 mg Oral Daily   atorvastatin  40 mg Oral Daily   carvedilol  6.25 mg Oral BID WC   enoxaparin (LOVENOX) injection  40 mg Subcutaneous [id number]   folic acid  1 mg Oral Daily   gabapentin  300 mg Oral BID   lacosamide  150 mg Oral BID   levETIRAcetam  750 mg Oral BID   losartan  100 mg Oral Daily   sodium chloride  1 g Oral TID WC   Vitamin D (Ergocalciferol)  50,000 Units Oral Q7 days   acetaminophen **OR** acetaminophen, ondansetron **OR** ondansetron (ZOFRAN) IV, oxyCODONE  Assessment/ Plan:  [name] is a 72 y.o.  female with past medical history including hypertension, CAD, seizure disorder, breast cancer with mastectomy, and hypothyroidism.  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  Will perform medication review to evaluate other contributing factors.  Sodium on admission 122.   Sodium corrected to 128 today.  Continue sal tabs and patient encouraged to maintain oral intake  2.  Chronic kidney disease stage IIIa.  Patient currently at baseline renal function.  Not currently followed by outpatient nephrology.  Will monitor renal function..  3. Anemia of chronic kidney disease Lab Results  Component Value Date   HGB 7.9 (L) 10/30/2022    Hemoglobin not at target.    LOS: 1 [place] 2/29/202412:47 PM

## 2022-10-30 NOTE — Evaluation (Signed)
Physical Therapy Evaluation Patient Details Name: Anne Phillips MRN: [id number] DOB: [birth year] Today's Date: 10/30/2022  History of Present Illness  Anne Phillips is a 72 y.o. female with medical history significant of seizure disorder, hypertension, hyperlipidemia, CAD, breast cancer s/p mastectomy, hypothyroidism, who presents to the ED with complaints of altered mental status and following fall at her STR. Found to have Comminuted, displaced and overriding fracture of the distal 3rd of the right clavicular shaft  Clinical Impression  Patient resting in bed upon arrival to room; alert and oriented to basic information, follows simple commands, requiring increased encouragement for participation with session.  Denies pain throughout; sling not donned at this time.  R shoulder elevation limited, requiring constant cuing for NWB and minimal use throughout session; L UE, bilat LE strength and ROM otherwise symmetrical and WFL for basic transfers and gait.  Generally unsteady in standing/gait efforts, requiring cga/min assist to maintain balance/safety.  Able to complete bed mobility with supervision; unsupported sitting balance with close sup; sit/stand, standing balance and basic transfers with cga/min assist.  Did attempt use of HW for external support, but patient quick to step away from and decline further use.  May consider other assist devices [facility]?) in subsequent sessions.   Would benefit from skilled PT to address above deficits and promote optimal return to PLOF.; recommend transition to STR upon discharge from acute hospitalization.        Recommendations for follow up therapy are one component of a multi-disciplinary discharge planning process, led by the attending physician.  Recommendations may be updated based on patient status, additional functional criteria and insurance authorization.  Follow Up Recommendations Skilled nursing-short term rehab (<3 hours/day) Can patient  physically be transported by private vehicle: Yes    Assistance Recommended at Discharge Frequent or constant Supervision/Assistance  Patient can return home with the following  A little help with walking and/or transfers;A little help with bathing/dressing/bathroom;Assistance with cooking/housework;Assist for transportation;Help with stairs or ramp for entrance    Equipment Recommendations Rolling walker (2 wheels)  Recommendations for Other Services       Functional Status Assessment Patient has had a recent decline in their functional status and demonstrates the ability to make significant improvements in function in a reasonable and predictable amount of time.     Precautions / Restrictions Precautions Precautions: Fall Restrictions Weight Bearing Restrictions: Yes RUE Weight Bearing: Non weight bearing      Mobility  Bed Mobility Overal bed mobility: Needs Assistance Bed Mobility: Supine to Sit   Sidelying to sit: Supervision Supine to sit: Supervision Sit to supine: Supervision   General bed mobility comments: consistent cuing for NWB R UE with transitional movements; limited/no carry-over between trials    Transfers Overall transfer level: Needs assistance Equipment used: None Transfers: Sit to/from Stand, Bed to chair/wheelchair/BSC Sit to Stand: Min guard Stand pivot transfers: Min guard         General transfer comment: attempted use of HW for external support, but patient quickly steps away from despite cuing; generally impulsive.  Higher-level, dynamic balance deficits noted.    Ambulation/Gait               General Gait Details: Deferred this session, as patient generally disinterested in session and progressive mobility efforts  Stairs            Wheelchair Mobility    Modified Rankin (Stroke Patients Only)       Balance Overall balance assessment: Needs assistance Sitting-balance support: No  upper extremity supported, Feet  supported Sitting balance-Leahy Scale: Good     Standing balance support: No upper extremity supported Standing balance-Leahy Scale: Fair                               Pertinent Vitals/Pain Pain Assessment Pain Assessment: No/denies pain    Home Living Family/patient expects to be discharged to:: Skilled nursing facility Living Arrangements: Alone Available Help at Discharge: Family Type of Home: House Home Access: Stairs to enter Entrance Stairs-Rails: Left Entrance Stairs-Number of Steps: 3-4   Home Layout: One level Home Equipment: Conservation officer, nature (2 wheels);Cane - single point;Tub bench Additional Comments: Patient limited historian; information from previous documentation.  recently d/c from hospital to [facility]. Prior to STR pt was living alone with sister nearby    Prior Function Prior Level of Function : Needs assist             Mobility Comments: prior to recent admission pt reports being Ind with ambulation without AD; intermittent use of RW when needed. ADLs Comments: states sister does the cooking     Hand Dominance        Extremity/Trunk Assessment   Upper Extremity Assessment RUE Deficits / Details: PROM WFL, AROM shoulder flexion limited    Lower Extremity Assessment Lower Extremity Assessment:  (grossly at least 4/5 throughout; no focal weakness appreciated)    Cervical / Trunk Assessment Cervical / Trunk Assessment: Kyphotic  Communication   Communication: No difficulties  Cognition Arousal/Alertness: Awake/alert Behavior During Therapy: WFL for tasks assessed/performed Overall Cognitive Status: Within Functional Limits for tasks assessed                                 General Comments: Generally oriented to basic information; follows simple commands; limited insight/safety awareness        General Comments      Exercises     Assessment/Plan    PT Assessment Patient needs continued PT  services  PT Problem List Decreased strength;Decreased activity tolerance;Decreased safety awareness;Decreased balance       PT Treatment Interventions DME instruction;Balance training;Gait training;Neuromuscular re-education;Stair training;Functional mobility training;Patient/family education;Therapeutic activities;Therapeutic exercise    PT Goals (Current goals can be found in the Care Plan section)  Acute Rehab PT Goals Patient Stated Goal: improve her mobility. PT Goal Formulation: With patient Time For Goal Achievement: 11/13/22 Potential to Achieve Goals: Fair    Frequency Min 2X/week     Co-evaluation               AM-PAC PT "6 Clicks" Mobility  Outcome Measure Help needed turning from your back to your side while in a flat bed without using bedrails?: None Help needed moving from lying on your back to sitting on the side of a flat bed without using bedrails?: None Help needed moving to and from a bed to a chair (including a wheelchair)?: A Little Help needed standing up from a chair using your arms (e.g., wheelchair or bedside chair)?: A Little Help needed to walk in hospital room?: A Little Help needed climbing 3-5 steps with a railing? : A Little 6 Click Score: 20    End of Session Equipment Utilized During Treatment: Gait belt Activity Tolerance: Patient tolerated treatment well Patient left: in bed;with call bell/phone within reach;with bed alarm set Nurse Communication: Mobility status;Precautions PT Visit Diagnosis: Difficulty in  walking, not elsewhere classified (R26.2);Unsteadiness on feet (R26.81);Muscle weakness (generalized) (M62.81)    Time: [id number] PT Time Calculation (min) (ACUTE ONLY): 23 min   Charges:   PT Evaluation $PT Eval Moderate Complexity: 1 Mod          Memorie Yokoyama H. Owens Shark, PT, DPT, NCS 10/30/22, 9:35 PM [phone]

## 2022-10-30 NOTE — Plan of Care (Signed)

## 2022-10-30 NOTE — Progress Notes (Signed)
[facility] Progress Note  Patient: Anne Phillips    [id number]  DOA: 10/28/2022     Date of Service: the patient was seen and examined on 10/30/2022  Chief Complaint  Patient presents with   Altered Mental Status   Brief hospital course: Anne Phillips is a 72 y.o. female with medical history significant of seizure disorder, hypertension, hyperlipidemia, CAD, breast cancer s/p mastectomy, hypothyroidism, who presents to the ED with complaints of altered mental status.  Patient is very poor historian, she fell couple of days ago, does not remember the details, she had fracture of right clavicle.  Patient has remote history of seizures.  As per patient's sister patient has been confused recently. ED workup: Hyponatremia sodium 124, creatinine 1.23 slightly elevated, CK2 43 CT right shoulder: Comminuted, displaced and overriding fracture of the distal 3rd of the right clavicular shaft with moderate surrounding soft tissue swelling. 2. Deformity of the greater tuberosity may relate to an old fracture as correlated with previous radiographs. A mild superimposed acute impaction fracture is difficult to exclude. [facility] contacted for admission for symptomatic hyponatremia.   Assessment and Plan:  # Hyponatremia, unknown cause could be SIADH versus hypovolemic hyponatremia Sodium 122, serum osmolality 261 low, currently, urine 70-91 S/p 500 mL NS bolus, Na 124 --128  Continue to monitor sodium level daily Nephrology consulted, no indication for 3% saline at this time 2/28 started sodium chloride tablets 1 g p.o. 3 times daily   # Acute metabolic encephalopathy could be secondary to hyponatremia Continue fall precautions and seizure precautions Continue supportive care   # Seizure disorder Patient has history of generalized tonic-clonic seizures as well as focal seizures with left arm jerking. Continue Keppra and Vimpat Continue seizure precautions Case was discussed with  neurology, recommended continue current treatment, if patient is having breakthrough episodes then we could give extra dose of Vimpat 100 mg and increase dose of Vimpat to 200 mg twice daily   Right clavicle fracture and deformity of greater tuberosity Continue as needed medication for pain control Immobilized right shoulder and nonweightbearing Seen by orthopedic surgery, recommended nonsurgical management  CKD stage IIIb Currently renal which is at baseline, continue to monitor.  Hypertension, HFpEF, no signs of volume overload at this time Held Lasix for now Continued Coreg and losartan Monitor BP and titrate medications accordingly   Anemia of chronic disease Hb 10.0-28.2, continue monitor Iron studies within normal range, folic acid 6.2 at lower end, B12 within normal range Started folic acid 1 mg p.o. daily   Vitamin D deficiency: started vitamin D 50,000 units p.o. weekly, follow with PCP to repeat vitamin D level after 3 to 6 months.   Hyperthyroidism, TSH 0.34 low and Free T4 level 1.37 elevated 2/29 started Methimazole 5 mg po BID x 3 days and 5 po daily  free T3 level   Diarrhea patient developed loose stools on 2/29 per RN Follow-up C. difficile and GI pathogen Started probiotics 2/29 started NS 75 ml/hr for 12 hours  Body mass index is 22.48 kg/m.  Interventions:       Diet: Regular diet DVT Prophylaxis: Subcutaneous Lovenox   Advance goals of care discussion: Full code  Family Communication: family was present at bedside, at the time of interview.  The pt provided permission to discuss medical plan with the family. Opportunity was given to ask question and all questions were answered satisfactorily.   Disposition:  Pt is from SNF, admitted with hyponatremia and AMS, still has low  sodium, which precludes a safe discharge. Discharge to SNF, when stable and cleared by nephrology..  Subjective: No significant overnight events, patient denies any  complaints, no significant pain in the right shoulder, no chest pain or palpitation no shortness of breath. RN informed that patient is having loose stools, we will do further workup.   Physical Exam: General: NAD, lying comfortably Appear in no distress, affect appropriate Eyes: PERRLA ENT: Oral Mucosa Clear, moist  Neck: no JVD,  Cardiovascular: S1 and S2 Present, no Murmur,  Respiratory: good respiratory effort, Bilateral Air entry equal and Decreased, no Crackles, no wheezes Abdomen: Bowel Sound present, Soft and no tenderness,  Skin: no rashes Extremities: no Pedal edema, no calf tenderness Neurologic: without any new focal findings Gait not checked due to patient safety concerns  Vitals:   10/28/22 2324 10/29/22 1621 10/29/22 2332 10/30/22 0756  BP: 139/66 109/66 (!) 144/75 (!) 147/70  Pulse: 63 67 65 68  Resp:  18 18 16  $ Temp: 98.1 F (36.7 C) 97.9 F (36.6 C) 98.1 F (36.7 C) 98 F (36.7 C)  TempSrc:      SpO2: 100% 100% 100% 99%  Weight:      Height:        Intake/Output Summary (Last 24 hours) at 10/30/2022 1617 Last data filed at 10/30/2022 1044 Gross per 24 hour  Intake --  Output 452 ml  Net -452 ml   Filed Weights   10/28/22 1057  Weight: 54 kg    Data Reviewed: I have personally reviewed and interpreted daily labs, tele strips, imagings as discussed above. I reviewed all nursing notes, pharmacy notes, vitals, pertinent old records I have discussed plan of care as described above with RN and patient/family.  CBC: Recent Labs  Lab 10/28/22 1235 10/29/22 0326 10/30/22 0630  WBC 7.1 6.8 6.2  NEUTROABS 5.9 4.9  --   HGB 10.0* 8.2* 7.9*  HCT 30.2* 24.2* 24.0*  MCV 85.3 84.0 85.1  PLT 223 205 [id number]   Basic Metabolic Panel: Recent Labs  Lab 10/28/22 1235 10/28/22 1643 10/28/22 2351 10/29/22 0917 10/30/22 0630  NA 122* 124* 124* 123* 128*  K 4.2 4.1 3.5 3.9 3.5  CL 87* 89* 91* 93* 97*  CO2 23 23 22 23 $ 21*  GLUCOSE 110* 110* 102* 109* 96   BUN 18 17 14 15 12  $ CREATININE 1.25* 1.23* 1.12* 1.15* 1.04*  CALCIUM 9.2 8.9 8.6* 8.6* 8.7*  MG  --   --   --  1.9 1.8  PHOS  --   --   --  3.0 2.8    Studies: No results found.  Scheduled Meds:  aspirin EC  81 mg Oral Daily   atorvastatin  40 mg Oral Daily   carvedilol  6.25 mg Oral BID WC   enoxaparin (LOVENOX) injection  40 mg Subcutaneous [id number]   folic acid  1 mg Oral Daily   gabapentin  300 mg Oral BID   lacosamide  150 mg Oral BID   levETIRAcetam  750 mg Oral BID   losartan  100 mg Oral Daily   sodium chloride  1 g Oral TID WC   Vitamin D (Ergocalciferol)  50,000 Units Oral Q7 days   Continuous Infusions: PRN Meds: acetaminophen **OR** acetaminophen, ondansetron **OR** ondansetron (ZOFRAN) IV, oxyCODONE  Time spent: 50 minutes  Author: Val Riles. MD [facility] 10/30/2022 4:17 PM  To reach On-call, see care teams to locate the attending and reach out to them via [email or web address]. If 7PM-7AM,  please contact night-coverage If you still have difficulty reaching the attending provider, please page the [facility] (Director on Call) for [facility] on amion for assistance.

## 2022-10-30 NOTE — Evaluation (Signed)
Occupational Therapy Evaluation Patient Details Name: Anne Phillips MRN: [id number] DOB: [birth year] Today's Date: 10/30/2022   History of Present Illness Anne Phillips is a 72 y.o. female with medical history significant of seizure disorder, hypertension, hyperlipidemia, CAD, breast cancer s/p mastectomy, hypothyroidism, who presents to the ED with complaints of altered mental status and following fall at her STR. Found to have Comminuted, displaced and overriding fracture of the distal 3rd of the right clavicular shaft   Clinical Impression   [name] was seen for OT evaluation this date. Prior to hospital admission, pt was recently at [facility], notably has had falls at facility. Pt presents to acute OT demonstrating impaired ADL performance and functional mobility 2/2 decreased activity tolerance and functional ROM/balance deficits. Pt currently requires CGA toilet t/f, minor LOBs noted navigating room. SBA pericare. Poor safety awareness and decreased insight into RUE NWBing, provided education with poor carryover. Pt would benefit from skilled OT to address noted impairments and functional limitations (see below for any additional details). Upon hospital discharge, recommend STR to maximize pt safety and return to PLOF.    Recommendations for follow up therapy are one component of a multi-disciplinary discharge planning process, led by the attending physician.  Recommendations may be updated based on patient status, additional functional criteria and insurance authorization.   Follow Up Recommendations  Skilled nursing-short term rehab (<3 hours/day)     Assistance Recommended at Discharge Intermittent Supervision/Assistance  Patient can return home with the following Assist for transportation;A little help with bathing/dressing/bathroom;A little help with walking and/or transfers;Assistance with cooking/housework;Help with stairs or ramp for entrance    Functional Status Assessment   Patient has had a recent decline in their functional status and demonstrates the ability to make significant improvements in function in a reasonable and predictable amount of time.  Equipment Recommendations  Other (comment) (defer)    Recommendations for Other Services       Precautions / Restrictions Precautions Precautions: Fall Restrictions Weight Bearing Restrictions: Yes RUE Weight Bearing: Non weight bearing      Mobility Bed Mobility Overal bed mobility: Needs Assistance Bed Mobility: Supine to Sit, Sit to Supine     Supine to sit: Supervision Sit to supine: Supervision   General bed mobility comments: cues to minimize RUE WBing    Transfers Overall transfer level: Needs assistance Equipment used: None Transfers: Sit to/from Stand Sit to Stand: Min guard                  Balance Overall balance assessment: Needs assistance Sitting-balance support: Feet unsupported Sitting balance-Leahy Scale: Good     Standing balance support: No upper extremity supported Standing balance-Leahy Scale: Good                             ADL either performed or assessed with clinical judgement   ADL Overall ADL's : Needs assistance/impaired                                       General ADL Comments: CGA toilet t/f, SBA pericare. MOD I don/doff B socks seated EOB      Pertinent Vitals/Pain Pain Assessment Pain Assessment: No/denies pain     Hand Dominance Right   Extremity/Trunk Assessment Upper Extremity Assessment Upper Extremity Assessment: RUE deficits/detail RUE Deficits / Details: PROM WFL, AROM shoulder flexion limited  Lower Extremity Assessment Lower Extremity Assessment: Generalized weakness       Communication Communication Communication: No difficulties   Cognition Arousal/Alertness: Awake/alert Behavior During Therapy: WFL for tasks assessed/performed Overall Cognitive Status: Within Functional Limits for  tasks assessed                                        Home Living Family/patient expects to be discharged to:: Skilled nursing facility                                 Additional Comments: recently d/c from hospital to [facility]. Prior to STR pt was living alone with sister nearby  Lives With: Family    Prior Functioning/Environment Prior Level of Function : Needs assist             Mobility Comments: prior to recent admission pt reports being Ind with ambulation without AD; intermittent use of RW when needed. ADLs Comments: states sister does the cooking        OT Problem List: Decreased strength;Decreased activity tolerance;Decreased cognition;Decreased knowledge of use of DME or AE;Impaired balance (sitting and/or standing)      OT Treatment/Interventions: Self-care/ADL training;Therapeutic exercise;DME and/or AE instruction;Therapeutic activities;Balance training;Patient/family education;Cognitive remediation/compensation    OT Goals(Current goals can be found in the care plan section) Acute Rehab OT Goals Patient Stated Goal: to go home OT Goal Formulation: With patient Time For Goal Achievement: 11/13/22 Potential to Achieve Goals: Good ADL Goals Pt Will Perform Grooming: standing;Independently Pt Will Perform Lower Body Dressing: Independently;sit to/from stand Pt Will Transfer to Toilet: Independently;ambulating;regular height toilet  OT Frequency: Min 2X/week    Co-evaluation              AM-PAC OT "6 Clicks" Daily Activity     Outcome Measure Help from another person eating meals?: None Help from another person taking care of personal grooming?: A Little Help from another person toileting, which includes using toliet, bedpan, or urinal?: A Little Help from another person bathing (including washing, rinsing, drying)?: A Little Help from another person to put on and taking off regular upper body clothing?: A  Little Help from another person to put on and taking off regular lower body clothing?: A Little 6 Click Score: 19   End of Session    Activity Tolerance: Patient tolerated treatment well Patient left: in bed;with bed alarm set;with call bell/phone within reach  OT Visit Diagnosis: Muscle weakness (generalized) (M62.81);History of falling (Z91.81)                Time: 1539-1600 OT Time Calculation (min): 21 min Charges:  OT General Charges $OT Visit: 1 Visit OT Evaluation $OT Eval Moderate Complexity: 1 Mod  Dessie Coma, M.S. OTR/L  10/30/22, 4:12 PM  ascom [phone]

## 2022-10-31 ENCOUNTER — Inpatient Hospital Stay: Payer: Medicare HMO

## 2022-10-31 DIAGNOSIS — E871 Hypo-osmolality and hyponatremia: Secondary | ICD-10-CM | POA: Diagnosis not present

## 2022-10-31 LAB — CBC
HCT: 23.6 % — ABNORMAL LOW (ref 36.0–46.0)
Hemoglobin: 7.8 g/dL — ABNORMAL LOW (ref 12.0–15.0)
MCH: 28.6 pg (ref 26.0–34.0)
MCHC: 33.1 g/dL (ref 30.0–36.0)
MCV: 86.4 fL (ref 80.0–100.0)
Platelets: 211 10*3/uL (ref 150–400)
RBC: 2.73 MIL/uL — ABNORMAL LOW (ref 3.87–5.11)
RDW: 13.9 % (ref 11.5–15.5)
WBC: 6 10*3/uL (ref 4.0–10.5)
nRBC: 0 % (ref 0.0–0.2)

## 2022-10-31 LAB — BASIC METABOLIC PANEL
Anion gap: 8 (ref 5–15)
BUN: 12 mg/dL (ref 8–23)
CO2: 21 mmol/L — ABNORMAL LOW (ref 22–32)
Calcium: 8.6 mg/dL — ABNORMAL LOW (ref 8.9–10.3)
Chloride: 97 mmol/L — ABNORMAL LOW (ref 98–111)
Creatinine, Ser: 1.13 mg/dL — ABNORMAL HIGH (ref 0.44–1.00)
GFR, Estimated: 52 mL/min — ABNORMAL LOW (ref >60)
Glucose, Bld: 99 mg/dL (ref 70–99)
Potassium: 3.5 mmol/L (ref 3.5–5.1)
Sodium: 126 mmol/L — ABNORMAL LOW (ref 135–145)

## 2022-10-31 LAB — T4, FREE: Free T4: 1.11 ng/dL (ref 0.61–1.12)

## 2022-10-31 LAB — MAGNESIUM: Magnesium: 1.7 mg/dL (ref 1.7–2.4)

## 2022-10-31 LAB — T3, FREE: T3, Free: 2.2 pg/mL (ref 2.0–4.4)

## 2022-10-31 LAB — PHOSPHORUS: Phosphorus: 2.3 mg/dL — ABNORMAL LOW (ref 2.5–4.6)

## 2022-10-31 MED ORDER — K PHOS MONO-SOD PHOS DI & MONO 155-852-130 MG PO TABS
500.0000 mg | ORAL_TABLET | Freq: Three times a day (TID) | ORAL | Status: AC
  Administered 2022-10-31 (×3): 500 mg via ORAL
  Filled 2022-10-31 (×3): qty 2

## 2022-10-31 MED ORDER — SODIUM CHLORIDE 1 G PO TABS
2.0000 g | ORAL_TABLET | Freq: Three times a day (TID) | ORAL | Status: DC
  Administered 2022-10-31 – 2022-11-06 (×19): 2 g via ORAL
  Filled 2022-10-31 (×19): qty 2

## 2022-10-31 MED ORDER — SODIUM CHLORIDE 0.9 % IV SOLN: INTRAVENOUS | Status: DC

## 2022-10-31 NOTE — Progress Notes (Signed)
[facility] Progress Note  Patient: Anne Phillips    [id number]  DOA: 10/28/2022     Date of Service: the patient was seen and examined on 10/31/2022  Chief Complaint  Patient presents with   Altered Mental Status   Brief hospital course: eggy KATRENIA Phillips is a 72 y.o. female with medical history significant of seizure disorder, hypertension, hyperlipidemia, CAD, breast cancer s/p mastectomy, hypothyroidism, who presents to the ED with complaints of altered mental status.  Patient is very poor historian, she fell couple of days ago, does not remember the details, she had fracture of right clavicle.  Patient has remote history of seizures.  As per patient's sister patient has been confused recently. ED workup: Hyponatremia sodium 124, creatinine 1.23 slightly elevated, CK2 43 CT right shoulder: Comminuted, displaced and overriding fracture of the distal 3rd of the right clavicular shaft with moderate surrounding soft tissue swelling. 2. Deformity of the greater tuberosity may relate to an old fracture as correlated with previous radiographs. A mild superimposed acute impaction fracture is difficult to exclude. TRH contacted for admission for symptomatic hyponatremia.   Assessment and Plan:  # Hyponatremia, unknown cause could be SIADH versus hypovolemic hyponatremia Sodium 122, serum osmolality 261 low, currently, urine 70-91 S/p 500 mL NS bolus, Na 124 --128--126 Continue to monitor sodium level daily Nephrology consulted, no indication for 3% saline at this time 2/28 started sodium chloride tablets 1 g p.o. 3 times daily 3/1 increase sodium chloride 2 g p.o. 3 times daily   # Hypophosphatemia due to nutritional deficiency.  Phos repleted.  # Acute metabolic encephalopathy could be secondary to hyponatremia Continue fall precautions and seizure precautions Continue supportive care   # Seizure disorder Patient has history of generalized tonic-clonic seizures as well as  focal seizures with left arm jerking. Continue Keppra and Vimpat Continue seizure precautions Case was discussed with neurology, recommended continue current treatment, if patient is having breakthrough episodes then we could give extra dose of Vimpat 100 mg and increase dose of Vimpat to 200 mg twice daily   Right clavicle fracture and deformity of greater tuberosity Continue as needed medication for pain control Immobilized right shoulder and nonweightbearing Seen by orthopedic surgery, recommended nonsurgical management  CKD stage IIIb Currently renal which is at baseline, continue to monitor.  Hypertension, HFpEF, no signs of volume overload at this time Held Lasix for now Continued Coreg and losartan Monitor BP and titrate medications accordingly   Anemia of chronic disease Hb 10.0-8.2--7.8 continue to monitor Iron studies within normal range, folic acid 6.2 at lower end, B12 within normal range Started folic acid 1 mg p.o. daily   Vitamin D deficiency: started vitamin D 50,000 units p.o. weekly, follow with PCP to repeat vitamin D level after 3 to 6 months.   Hyperthyroidism, TSH 0.34 low and Free T4 level 1.37 elevated 2/29 started Methimazole 5 mg po BID x 3 days and 5 po daily  free T3 level  3/1 Free T4 level 1.11, improved Follow thyroid antibodies and thyroid ultrasound Patient stated that she has history of hyperactive thyroid and she was on medication which she stopped in 2019.  Recommended to follow with endocrinologist as an outpatient.  Diarrhea patient developed loose stools on 2/29 per RN Follow-up C. difficile and GI pathogen Started probiotics 2/29 s/p NS 75 ml/hr for 12 hours  Body mass index is 22.48 kg/m.  Interventions:       Diet: Regular diet DVT Prophylaxis: Subcutaneous Lovenox  Advance goals of care discussion: Full code  Family Communication: family was present at bedside, at the time of interview.  The pt provided permission to  discuss medical plan with the family. Opportunity was given to ask question and all questions were answered satisfactorily.   Disposition:  Pt is from SNF, admitted with hyponatremia and AMS, still has low sodium, which precludes a safe discharge. Discharge to SNF, when stable and cleared by nephrology..  Subjective: No significant overnight events, in the morning time patient was complaining of headache and right arm pain 8/10, patient said the diarrhea has been stopped.  No nausea vomiting.  Patient's appetite is normal.  Denies any seizures, no chest pain or palpitation, no shortness of breath.   Physical Exam: General: NAD, lying comfortably Appear in no distress, affect appropriate Eyes: PERRLA ENT: Oral Mucosa Clear, moist  Neck: no JVD,  Cardiovascular: S1 and S2 Present, no Murmur,  Respiratory: good respiratory effort, Bilateral Air entry equal and Decreased, no Crackles, no wheezes Abdomen: Bowel Sound present, Soft and no tenderness,  Skin: no rashes Extremities: no Pedal edema, no calf tenderness Neurologic: without any new focal findings Gait not checked due to patient safety concerns  Vitals:   10/30/22 1626 10/30/22 2355 10/31/22 0125 10/31/22 0823  BP: 130/65 (!) 136/52 126/88 (!) 158/57  Pulse: 64 70 (!) 59 66  Resp: '15 15 15 18  '$ Temp: 97.6 F (36.4 C) 97.9 F (36.6 C) 97.9 F (36.6 C) 97.9 F (36.6 C)  TempSrc:      SpO2: 100% 100% 99% 100%  Weight:      Height:        Intake/Output Summary (Last 24 hours) at 10/31/2022 1546 Last data filed at 10/31/2022 1118 Gross per 24 hour  Intake 150.22 ml  Output 300 ml  Net -149.78 ml   Filed Weights   10/28/22 1057  Weight: 54 kg    Data Reviewed: I have personally reviewed and interpreted daily labs, tele strips, imagings as discussed above. I reviewed all nursing notes, pharmacy notes, vitals, pertinent old records I have discussed plan of care as described above with RN and patient/family.  CBC: Recent  Labs  Lab 10/28/22 1235 10/29/22 0326 10/30/22 0630 10/31/22 0329  WBC 7.1 6.8 6.2 6.0  NEUTROABS 5.9 4.9  --   --   HGB 10.0* 8.2* 7.9* 7.8*  HCT 30.2* 24.2* 24.0* 23.6*  MCV 85.3 84.0 85.1 86.4  PLT 223 205 224 [id number]   Basic Metabolic Panel: Recent Labs  Lab 10/28/22 1643 10/28/22 2351 10/29/22 0917 10/30/22 0630 10/31/22 0329  NA 124* 124* 123* 128* 126*  K 4.1 3.5 3.9 3.5 3.5  CL 89* 91* 93* 97* 97*  CO2 '23 22 23 '$ 21* 21*  GLUCOSE 110* 102* 109* 96 99  BUN '17 14 15 12 12  '$ CREATININE 1.23* 1.12* 1.15* 1.04* 1.13*  CALCIUM 8.9 8.6* 8.6* 8.7* 8.6*  MG  --   --  1.9 1.8 1.7  PHOS  --   --  3.0 2.8 2.3*    Studies: No results found.  Scheduled Meds:  aspirin EC  81 mg Oral Daily   atorvastatin  40 mg Oral Daily   carvedilol  6.25 mg Oral BID WC   enoxaparin (LOVENOX) injection  40 mg Subcutaneous [id number]   folic acid  1 mg Oral Daily   gabapentin  300 mg Oral BID   lacosamide  150 mg Oral BID   levETIRAcetam  750 mg Oral BID  losartan  100 mg Oral Daily   methimazole  5 mg Oral BID   Followed by   Derrill Memo ON 11/03/2022] methimazole  5 mg Oral Daily   phosphorus  500 mg Oral TID   saccharomyces boulardii  250 mg Oral BID   sodium chloride  2 g Oral TID WC   Vitamin D (Ergocalciferol)  50,000 Units Oral Q7 days   Continuous Infusions: PRN Meds: acetaminophen **OR** acetaminophen, ondansetron **OR** ondansetron (ZOFRAN) IV, oxyCODONE  Time spent: 35 minutes  Author: Val Riles. MD [facility] 10/31/2022 3:46 PM  To reach On-call, see care teams to locate the attending and reach out to them via [email or web address]. If 7PM-7AM, please contact night-coverage If you still have difficulty reaching the attending provider, please page the [facility] (Director on Call) for [facility] on amion for assistance.

## 2022-10-31 NOTE — TOC Progression Note (Signed)
Transition of Care [facility]) - Progression Note    Patient Details  Name: Anne Phillips MRN: [id number] Date of Birth: Nov 21, 1950  Transition of Care [facility]) CM/SW [place], RN Phone Number: 10/31/2022, 3:06 PM  Clinical Narrative:    [facility] authorization submitted. Authorization pending.  [place] Reference number [id number].   Releases completed.          Expected Discharge Plan and Services                                               Social Determinants of Health (SDOH) Interventions SDOH Screenings   Food Insecurity: No Food Insecurity (10/28/2022)  Housing: Low Risk  (10/28/2022)  Transportation Needs: No Transportation Needs (10/28/2022)  Utilities: Not At Risk (10/28/2022)  Tobacco Use: High Risk (10/28/2022)    Readmission Risk Interventions    10/12/2022   11:51 AM  Readmission Risk Prevention Plan  PCP or Specialist Appt within 3-5 Days Complete  Social Work Consult for [place] Planning/Counseling Complete  Palliative Care Screening Not Applicable

## 2022-10-31 NOTE — Progress Notes (Signed)
[facility]  ROUNDING NOTE   Subjective:   [name] is a 72 y.o.  female with past medical history including hypertension, CAD, seizure disorder, breast cancer with mastectomy, and hypothyroidism.  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    Patient seen laying in bed States she feels well today Awaiting breakfast States she ate well yesterday  Sodium 126 (128)  Objective:  Vital signs in last 24 hours:  Temp:  [97.6 F (36.4 C)-97.9 F (36.6 C)] 97.9 F (36.6 C) (03/01 0823) Pulse Rate:  [59-70] 66 (03/01 0823) Resp:  [15-18] 18 (03/01 0823) BP: (126-158)/(52-88) 158/57 (03/01 0823) SpO2:  [99 %-100 %] 100 % (03/01 0823)  Weight change:  Filed Weights   10/28/22 1057  Weight: 54 kg    Intake/Output: I/O last 3 completed shifts: In: -  Out: 2 [Urine:2]   Intake/Output this shift:  Total I/O In: 150.2 [I.V.:150.2] Out: 300 [Urine:300]  Physical Exam: General: NAD  Head: Normocephalic, atraumatic. Moist oral mucosal membranes  Eyes: Anicteric  Lungs:  Clear to auscultation, normal effort, room air  Heart: Regular rate and rhythm  Abdomen:  Soft, nontender, nondistended  Extremities: No peripheral edema.  Neurologic: Alert and oriented, moving all four extremities  Skin: No lesions  Access: None    Basic Metabolic Panel: Recent Labs  Lab 10/28/22 1643 10/28/22 2351 10/29/22 0917 10/30/22 0630 10/31/22 0329  NA 124* 124* 123* 128* 126*  K 4.1 3.5 3.9 3.5 3.5  CL 89* 91* 93* 97* 97*  CO2 '23 22 23 '$ 21* 21*  GLUCOSE 110* 102* 109* 96 99  BUN '17 14 15 12 12  '$ CREATININE 1.23* 1.12* 1.15* 1.04* 1.13*  CALCIUM 8.9 8.6* 8.6*  8.7* 8.6*  MG  --   --  1.9 1.8 1.7  PHOS  --   --  3.0 2.8 2.3*     Liver Function Tests: Recent Labs  Lab 10/28/22 1235  AST 39  ALT 13  ALKPHOS 137*  BILITOT 1.4*  PROT 8.1  ALBUMIN 4.2    No results for input(s): "LIPASE", "AMYLASE" in the last 168 hours. No results for input(s): "AMMONIA" in the last 168 hours.  CBC: Recent Labs  Lab 10/28/22 1235 10/29/22 0326 10/30/22 0630 10/31/22 0329  WBC 7.1 6.8 6.2 6.0  NEUTROABS 5.9 4.9  --   --   HGB 10.0* 8.2* 7.9* 7.8*  HCT 30.2* 24.2* 24.0* 23.6*  MCV 85.3 84.0 85.1 86.4  PLT 223 205 224 211     Cardiac Enzymes: Recent Labs  Lab 10/29/22 0917  CKTOTAL 243*     BNP: Invalid input(s): "POCBNP"  CBG: No results for input(s): "GLUCAP" in the last 168 hours.  Microbiology: Results for orders placed or performed during the hospital encounter of 10/28/22  MRSA Next Gen by PCR, Nasal     Status: None   Collection Time: 10/28/22  4:00 PM   Specimen: Nasal Mucosa; Nasal Swab  Result Value Ref Range Status   MRSA by PCR Next Gen NOT DETECTED NOT DETECTED Final    Comment: (NOTE) The GeneXpert MRSA Assay (FDA approved for NASAL specimens only), is one component of a comprehensive MRSA colonization surveillance program. It is not intended to diagnose MRSA infection  nor to guide or monitor treatment for MRSA infections. Test performance is not FDA approved in patients less than 69 years old. Performed at [facility], [address] [place], [place] [address]     Coagulation Studies: No results for input(s): "LABPROT", "INR" in the last 72 hours.  Urinalysis: Recent Labs    10/28/22 1313 10/30/22 1625  COLORURINE YELLOW* YELLOW*  LABSPEC 1.011 1.011  PHURINE 5.0 6.0  GLUCOSEU 150* 150*  HGBUR NEGATIVE NEGATIVE  BILIRUBINUR NEGATIVE NEGATIVE  KETONESUR NEGATIVE NEGATIVE  PROTEINUR NEGATIVE NEGATIVE  NITRITE NEGATIVE NEGATIVE  LEUKOCYTESUR MODERATE* NEGATIVE       Imaging: No  results found.   Medications:     aspirin EC  81 mg Oral Daily   atorvastatin  40 mg Oral Daily   carvedilol  6.25 mg Oral BID WC   enoxaparin (LOVENOX) injection  40 mg Subcutaneous [id number]   folic acid  1 mg Oral Daily   gabapentin  300 mg Oral BID   lacosamide  150 mg Oral BID   levETIRAcetam  750 mg Oral BID   losartan  100 mg Oral Daily   methimazole  5 mg Oral BID   Followed by   Derrill Memo ON 11/03/2022] methimazole  5 mg Oral Daily   phosphorus  500 mg Oral TID   saccharomyces boulardii  250 mg Oral BID   sodium chloride  2 g Oral TID WC   Vitamin D (Ergocalciferol)  50,000 Units Oral Q7 days   acetaminophen **OR** acetaminophen, ondansetron **OR** ondansetron (ZOFRAN) IV, oxyCODONE  Assessment/ Plan:  [name] is a 72 y.o.  female with past medical history including hypertension, CAD, seizure disorder, breast cancer with mastectomy, and hypothyroidism.  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  Will perform medication review to evaluate other contributing factors.  Sodium on admission 122.   Sodium 126 today.  Will increase salt tabs to 2g three times a day. IVF not recommended at this time.   2.  Chronic kidney disease stage IIIa.  Patient currently at baseline renal function.  Not currently followed by outpatient nephrology.  Will monitor renal function..  3. Anemia of chronic kidney disease Lab Results  Component Value Date   HGB 7.8 (L) 10/31/2022    Hemoglobin below desired range.    LOS: 2   3/1/202411:44 AM

## 2022-10-31 NOTE — Plan of Care (Signed)
  Problem: Education: Goal: Knowledge of patient specific risk factors will improve Anne Phillips N/A or DELETE if not current risk factor) Outcome: Progressing   Problem: Coping: Goal: Will verbalize positive feelings about self Outcome: Progressing   Problem: Health Behavior/Discharge Planning: Goal: Ability to manage health-related needs will improve Outcome: Progressing   Problem: Education: Goal: Knowledge of General Education information will improve Description: Including pain rating scale, medication(s)/side effects and non-pharmacologic comfort measures Outcome: Progressing   Problem: Nutrition: Goal: Dietary intake will improve Outcome: Progressing

## 2022-11-01 DIAGNOSIS — E871 Hypo-osmolality and hyponatremia: Secondary | ICD-10-CM | POA: Diagnosis not present

## 2022-11-01 LAB — BASIC METABOLIC PANEL
Anion gap: 9 (ref 5–15)
BUN: 11 mg/dL (ref 8–23)
CO2: 22 mmol/L (ref 22–32)
Calcium: 8 mg/dL — ABNORMAL LOW (ref 8.9–10.3)
Chloride: 100 mmol/L (ref 98–111)
Creatinine, Ser: 1.01 mg/dL — ABNORMAL HIGH (ref 0.44–1.00)
GFR, Estimated: 60 mL/min — ABNORMAL LOW (ref >60)
Glucose, Bld: 96 mg/dL (ref 70–99)
Potassium: 3.5 mmol/L (ref 3.5–5.1)
Sodium: 131 mmol/L — ABNORMAL LOW (ref 135–145)

## 2022-11-01 LAB — CBC
HCT: 24.2 % — ABNORMAL LOW (ref 36.0–46.0)
Hemoglobin: 8 g/dL — ABNORMAL LOW (ref 12.0–15.0)
MCH: 28.5 pg (ref 26.0–34.0)
MCHC: 33.1 g/dL (ref 30.0–36.0)
MCV: 86.1 fL (ref 80.0–100.0)
Platelets: 210 10*3/uL (ref 150–400)
RBC: 2.81 MIL/uL — ABNORMAL LOW (ref 3.87–5.11)
RDW: 14 % (ref 11.5–15.5)
WBC: 6.1 10*3/uL (ref 4.0–10.5)
nRBC: 0 % (ref 0.0–0.2)

## 2022-11-01 LAB — T4, FREE: Free T4: 1.19 ng/dL — ABNORMAL HIGH (ref 0.61–1.12)

## 2022-11-01 LAB — MAGNESIUM: Magnesium: 1.6 mg/dL — ABNORMAL LOW (ref 1.7–2.4)

## 2022-11-01 LAB — PHOSPHORUS: Phosphorus: 4.8 mg/dL — ABNORMAL HIGH (ref 2.5–4.6)

## 2022-11-01 LAB — THYROID ANTIBODIES
Thyroglobulin Antibody: <1 IU/mL (ref 0.0–0.9)
Thyroperoxidase Ab SerPl-aCnc: 28 IU/mL (ref 0–34)

## 2022-11-01 MED ORDER — MAGNESIUM SULFATE 2 GM/50ML IV SOLN
2.0000 g | Freq: Once | INTRAVENOUS | Status: AC
  Administered 2022-11-01: 2 g via INTRAVENOUS
  Filled 2022-11-01: qty 50

## 2022-11-01 NOTE — Plan of Care (Signed)

## 2022-11-01 NOTE — Plan of Care (Signed)

## 2022-11-01 NOTE — Progress Notes (Signed)
[facility]  ROUNDING NOTE   Subjective:   [name] is a 72 y.o.  female with past medical history including hypertension, CAD, seizure disorder, breast cancer with mastectomy, and hypothyroidism.  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    Patient seen laying in bed, no family at bedside Appetite remains appropriate States she is ready for discharge  Sodium 131  Objective:  Vital signs in last 24 hours:  Temp:  [97.9 F (36.6 C)-98 F (36.7 C)] 97.9 F (36.6 C) (03/01 2345) Pulse Rate:  [63-76] 63 (03/01 2345) Resp:  [15-16] 15 (03/01 2345) BP: (115-128)/(55-69) 115/55 (03/01 2345) SpO2:  [99 %-100 %] 100 % (03/01 2345)  Weight change:  Filed Weights   10/28/22 1057  Weight: 54 kg    Intake/Output: I/O last 3 completed shifts: In: 150.2 [I.V.:150.2] Out: 300 [Urine:300]   Intake/Output this shift:  Total I/O In: 240 [P.O.:240] Out: -   Physical Exam: General: NAD  Head: Normocephalic, atraumatic. Moist oral mucosal membranes  Eyes: Anicteric  Lungs:  Clear to auscultation, normal effort, room air  Heart: Regular rate and rhythm  Abdomen:  Soft, nontender, nondistended  Extremities: No peripheral edema.  Neurologic: Alert and oriented, moving all four extremities  Skin: No lesions  Access: None    Basic Metabolic Panel: Recent Labs  Lab 10/28/22 2351 10/29/22 0917 10/30/22 0630 10/31/22 0329 11/01/22 0651  NA 124* 123* 128* 126* 131*  K 3.5 3.9 3.5 3.5 3.5  CL 91* 93* 97* 97* 100  CO2 22 23 21* 21* 22  GLUCOSE 102* 109* 96 99 96  BUN '14 15 12 12 11  '$ CREATININE 1.12* 1.15* 1.04* 1.13* 1.01*  CALCIUM 8.6* 8.6* 8.7*  8.6* 8.0*  MG  --  1.9 1.8 1.7 1.6*  PHOS  --  3.0 2.8 2.3* 4.8*     Liver Function Tests: Recent Labs  Lab 10/28/22 1235  AST 39  ALT 13  ALKPHOS 137*  BILITOT 1.4*  PROT 8.1  ALBUMIN 4.2    No results for input(s): "LIPASE", "AMYLASE" in the last 168 hours. No results for input(s): "AMMONIA" in the last 168 hours.  CBC: Recent Labs  Lab 10/28/22 1235 10/29/22 0326 10/30/22 0630 10/31/22 0329 11/01/22 0651  WBC 7.1 6.8 6.2 6.0 6.1  NEUTROABS 5.9 4.9  --   --   --   HGB 10.0* 8.2* 7.9* 7.8* 8.0*  HCT 30.2* 24.2* 24.0* 23.6* 24.2*  MCV 85.3 84.0 85.1 86.4 86.1  PLT 223 205 224 211 210     Cardiac Enzymes: Recent Labs  Lab 10/29/22 0917  CKTOTAL 243*     BNP: Invalid input(s): "POCBNP"  CBG: No results for input(s): "GLUCAP" in the last 168 hours.  Microbiology: Results for orders placed or performed during the hospital encounter of 10/28/22  MRSA Next Gen by PCR, Nasal     Status: None   Collection Time: 10/28/22  4:00 PM   Specimen: Nasal Mucosa; Nasal Swab  Result Value Ref Range Status   MRSA by PCR Next Gen NOT DETECTED NOT DETECTED Final    Comment: (NOTE) The GeneXpert MRSA Assay (FDA approved for NASAL specimens only), is one component of a comprehensive MRSA colonization surveillance program. It is  not intended to diagnose MRSA infection nor to guide or monitor treatment for MRSA infections. Test performance is not FDA approved in patients less than 13 years old. Performed at [facility], [address] [place], [place] [address]     Coagulation Studies: No results for input(s): "LABPROT", "INR" in the last 72 hours.  Urinalysis: Recent Labs    10/30/22 1625  COLORURINE YELLOW*  LABSPEC 1.011  PHURINE 6.0  GLUCOSEU 150*  HGBUR NEGATIVE  BILIRUBINUR NEGATIVE  KETONESUR NEGATIVE  PROTEINUR NEGATIVE  NITRITE NEGATIVE  LEUKOCYTESUR NEGATIVE       Imaging: US THYROID  Result Date: 10/31/2022 CLINICAL DATA:   Hyperthyroid.  Known history of thyroid nodules. EXAM: THYROID ULTRASOUND TECHNIQUE: Ultrasound examination of the thyroid gland and adjacent soft tissues was performed. COMPARISON:  08/03/2021 FINDINGS: Parenchymal Echotexture: Mildly heterogenous Isthmus: 0.3 cm Right lobe: 6.3 x 2.8 x 2.5 cm Left lobe: 5.4 x 2.5 x 2.2 cm _________________________________________________________ Estimated total number of nodules >/= 1 cm: 4 Number of spongiform nodules >/=  2 cm not described below (TR1): 0 Number of mixed cystic and solid nodules >/= 1.5 cm not described below ([place]): 0 _________________________________________________________ Nodule # 1: Prior biopsy: No Location: Right; Mid Maximum size: 2.5 cm; Other 2 dimensions: 1.7 x 1.8 cm, previously, 2.5 cm Composition: solid/almost completely solid (2) Echogenicity: isoechoic (1) Shape: not taller-than-wide (0) Margins: ill-defined (0) Echogenic foci: none (0) ACR TI-RADS total points: 3. ACR TI-RADS risk category:  TR3 (3 points). Significant change in size (>/= 20% in two dimensions and minimal increase of 2 mm): No Change in features: Yes Change in ACR TI-RADS risk category: Yes ACR TI-RADS recommendations: This nodule technically meets criteria for fine-needle aspiration. However, it is stable in size and appearance since the 2022 study and follow-up ultrasound in 1 year could also be considered. _________________________________________________________ Nodule # 2: Prior biopsy: No Location: Right; Inferior Maximum size: 3.7 cm; Other 2 dimensions: 3.3 x 3.3 cm, previously, 3.5 cm Composition: solid/almost completely solid (2) Echogenicity: isoechoic (1) Shape: not taller-than-wide (0) Margins: ill-defined (0) Echogenic foci: none (0) ACR TI-RADS total points: 3. ACR TI-RADS risk category:  TR3 (3 points). Significant change in size (>/= 20% in two dimensions and minimal increase of 2 mm): No Change in features: Yes Change in ACR TI-RADS risk category: Yes ACR  TI-RADS recommendations: This nodule also technically meets criteria for fine-needle aspiration. It is relatively stable in size and appears similar to the prior study. One year follow-up ultrasound could also be considered. _________________________________________________________ Nodule # 3: Prior biopsy: No Location: Left; Inferior Maximum size: 2.1 cm; Other 2 dimensions: 1.9 x 2.1 cm, previously, 2.4 cm Composition: solid/almost completely solid (2) Echogenicity: isoechoic (1) Shape: not taller-than-wide (0) Margins: smooth (0) Echogenic foci: none (0) ACR TI-RADS total points: 3. ACR TI-RADS risk category:  TR3 (3 points). Significant change in size (>/= 20% in two dimensions and minimal increase of 2 mm): No Change in features: No Change in ACR TI-RADS risk category: No ACR TI-RADS recommendations: *Given size (>/= 1.5 - 2.4 cm) and appearance, a follow-up ultrasound in 1 year should be considered based on TI-RADS criteria. _________________________________________________________ Nodule # 4: Location: Left; Inferior Maximum size: 1.2 cm; Other 2 dimensions: 1.2 x 0.9 cm Composition: solid/almost completely solid (2) Echogenicity: isoechoic (1) Shape: not taller-than-wide (0) Margins: smooth (0) Echogenic foci: none (0) ACR TI-RADS total points: 3. ACR TI-RADS risk category: TR3 (3 points). ACR TI-RADS recommendations: Given size (<1.4 cm) and appearance,  this nodule does NOT meet TI-RADS criteria for biopsy or dedicated follow-up. _________________________________________________________ No enlarged or abnormal appearing lymph nodes are identified. IMPRESSION: 1. Nodules 1 and 2 in the right lobe meet criteria for fine-needle aspiration. However, these are stable in size and appearance since the prior study in 2022 and a 1 year follow-up ultrasound could also be considered. 2. Stable 2.1 cm left inferior thyroid nodule (nodule 3). Recommend 1 year follow-up ultrasound. 3. A 1.2 cm left inferior nodule not  previously described does not meet criteria for biopsy or dedicated follow-up. The above is in keeping with the ACR TI-RADS recommendations - J Am Coll Radiol 2017;14:587-595. Electronically Signed   By: Aletta Edouard M.D.   On: 10/31/2022 16:18     Medications:    magnesium sulfate bolus IVPB      aspirin EC  81 mg Oral Daily   atorvastatin  40 mg Oral Daily   carvedilol  6.25 mg Oral BID WC   enoxaparin (LOVENOX) injection  40 mg Subcutaneous [id number]   folic acid  1 mg Oral Daily   gabapentin  300 mg Oral BID   lacosamide  150 mg Oral BID   levETIRAcetam  750 mg Oral BID   losartan  100 mg Oral Daily   methimazole  5 mg Oral BID   Followed by   Derrill Memo ON 11/03/2022] methimazole  5 mg Oral Daily   saccharomyces boulardii  250 mg Oral BID   sodium chloride  2 g Oral TID WC   Vitamin D (Ergocalciferol)  50,000 Units Oral Q7 days   acetaminophen **OR** acetaminophen, ondansetron **OR** ondansetron (ZOFRAN) IV, oxyCODONE  Assessment/ Plan:  [name] is a 72 y.o.  female with past medical history including hypertension, CAD, seizure disorder, breast cancer with mastectomy, and hypothyroidism.  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  Will perform medication review to evaluate other contributing factors.  Sodium on admission 122.   Sodium improved to 131 today. Remains on salt tabs 2 g 3 times daily.  Would consider continuing salt tabs at discharge.  2.  Chronic kidney disease stage IIIa.  Patient currently at baseline renal function.  Not currently followed by outpatient nephrology.    3. Anemia of chronic kidney disease Lab Results  Component Value Date   HGB 8.0 (L) 11/01/2022     Hemoglobin decreased but slowly improving.   LOS: 3   3/2/202410:21 AM

## 2022-11-01 NOTE — Progress Notes (Signed)
[facility] Progress Note  Patient: Anne Phillips    [id number]  DOA: 10/28/2022     Date of Service: the patient was seen and examined on 11/01/2022  Chief Complaint  Patient presents with   Altered Mental Status   Brief hospital course: eggy Anne Phillips is a 72 y.o. female with medical history significant of seizure disorder, hypertension, hyperlipidemia, CAD, breast cancer s/p mastectomy, hypothyroidism, who presents to the ED with complaints of altered mental status.  Patient is very poor historian, she fell couple of days ago, does not remember the details, she had fracture of right clavicle.  Patient has remote history of seizures.  As per patient's sister patient has been confused recently. ED workup: Hyponatremia sodium 124, creatinine 1.23 slightly elevated, CK2 43 CT right shoulder: Comminuted, displaced and overriding fracture of the distal 3rd of the right clavicular shaft with moderate surrounding soft tissue swelling. 2. Deformity of the greater tuberosity may relate to an old fracture as correlated with previous radiographs. A mild superimposed acute impaction fracture is difficult to exclude. [facility] contacted for admission for symptomatic hyponatremia.   Assessment and Plan:  # Hyponatremia, unknown cause could be SIADH versus hypovolemic hyponatremia Sodium 122, serum osmolality 261 low, currently, urine 70-91 S/p 500 mL NS bolus, Na 124 --128--126--131 Continue to monitor sodium level daily Nephrology consulted, no indication for 3% saline at this time 2/28 started sodium chloride tablets 1 g p.o. 3 times daily 3/1 increase sodium chloride 2 g p.o. 3 times daily  # Hypomagnesemia, mag repleted. # Hypophosphatemia due to nutritional deficiency.  Phos repleted. Monitor electrolytes and replete as needed.  # Acute metabolic encephalopathy could be secondary to hyponatremia Continue fall precautions and seizure precautions Continue supportive care   # Seizure  disorder Patient has history of generalized tonic-clonic seizures as well as focal seizures with left arm jerking. Continue Keppra and Vimpat Continue seizure precautions Case was discussed with neurology, recommended continue current treatment, if patient is having breakthrough episodes then we could give extra dose of Vimpat 100 mg and increase dose of Vimpat to 200 mg twice daily   Right clavicle fracture and deformity of greater tuberosity Continue as needed medication for pain control Immobilized right shoulder and nonweightbearing Seen by orthopedic surgery, recommended nonsurgical management  CKD stage IIIb Currently renal which is at baseline, continue to monitor.  Hypertension, HFpEF, no signs of volume overload at this time Held Lasix for now Continued Coreg and losartan Monitor BP and titrate medications accordingly   Anemia of chronic disease Hb 10.0-8.2--7.8 continue to monitor Iron studies within normal range, folic acid 6.2 at lower end, B12 within normal range Started folic acid 1 mg p.o. daily   Vitamin D deficiency: started vitamin D 50,000 units p.o. weekly, follow with PCP to repeat vitamin D level after 3 to 6 months.   Hyperthyroidism, TSH 0.34 low and Free T4 level 1.37 elevated 2/29 started Methimazole 5 mg po BID x 3 days and 5 po daily  free T3 level  3/1 Free T4 level  1.37--1.11--1.19 fluctuating Thyroid antibodies negative US Thyroid: Multiple nodules in the bilateral lobes, either patient needs a biopsy versus follow-up ultrasound in 1 year.  Please review complete report for details. Patient stated that she has history of overactive thyroid and she was on medication which she stopped in 2019.  Recommended to follow with endocrinologist as an outpatient.  Diarrhea patient developed loose stools on 2/29 per RN C. difficile and GI pathogen was ordered  but patient's diarrhea resolved so sample was not sent. Started probiotics 2/29 s/p NS 75 ml/hr  for 12 hours 3/2 again patient developed diarrhea several episodes, GI pathogen stool sample ordered.   Body mass index is 22.48 kg/m.  Interventions:       Diet: Regular diet DVT Prophylaxis: Subcutaneous Lovenox   Advance goals of care discussion: Full code  Family Communication: family was present at bedside, at the time of interview.  The pt provided permission to discuss medical plan with the family. Opportunity was given to ask question and all questions were answered satisfactorily.   Disposition:  Pt is from SNF, admitted with hyponatremia and AMS, still has low sodium, which precludes a safe discharge. Discharge to SNF, when stable and cleared by nephrology..  Subjective: No significant overnight events except patient developed diarrhea several episodes, patient is asking for any antidiarrheal medication.  RN was advised to send stool sample for GI pathogen before starting antidiarrheal medication.  Will continue probiotics in the meantime. Remains getting better, magnesium is low 1.6 which has been repleted.  Will continue to monitor today and plan for disposition when sodium will improve.   Physical Exam: General: NAD, lying comfortably Appear in no distress, affect appropriate Eyes: PERRLA ENT: Oral Mucosa Clear, moist  Neck: no JVD,  Cardiovascular: S1 and S2 Present, no Murmur,  Respiratory: good respiratory effort, Bilateral Air entry equal and Decreased, no Crackles, no wheezes Abdomen: Bowel Sound present, Soft and no tenderness,  Skin: no rashes Extremities: no Pedal edema, no calf tenderness Neurologic: without any new focal findings Gait not checked due to patient safety concerns  Vitals:   10/31/22 0125 10/31/22 0823 10/31/22 1606 10/31/22 2345  BP: 126/88 (!) 158/57 128/69 (!) 115/55  Pulse: (!) 59 66 76 63  Resp: '15 18 16 15  '$ Temp: 97.9 F (36.6 C) 97.9 F (36.6 C) 98 F (36.7 C) 97.9 F (36.6 C)  TempSrc:      SpO2: 99% 100% 99% 100%   Weight:      Height:        Intake/Output Summary (Last 24 hours) at 11/01/2022 0839 Last data filed at 11/01/2022 0243 Gross per 24 hour  Intake 150.22 ml  Output 0 ml  Net 150.22 ml   Filed Weights   10/28/22 1057  Weight: 54 kg    Data Reviewed: I have personally reviewed and interpreted daily labs, tele strips, imagings as discussed above. I reviewed all nursing notes, pharmacy notes, vitals, pertinent old records I have discussed plan of care as described above with RN and patient/family.  CBC: Recent Labs  Lab 10/28/22 1235 10/29/22 0326 10/30/22 0630 10/31/22 0329 11/01/22 0651  WBC 7.1 6.8 6.2 6.0 6.1  NEUTROABS 5.9 4.9  --   --   --   HGB 10.0* 8.2* 7.9* 7.8* 8.0*  HCT 30.2* 24.2* 24.0* 23.6* 24.2*  MCV 85.3 84.0 85.1 86.4 86.1  PLT 223 205 224 211 [id number]   Basic Metabolic Panel: Recent Labs  Lab 10/28/22 2351 10/29/22 0917 10/30/22 0630 10/31/22 0329 11/01/22 0651  NA 124* 123* 128* 126* 131*  K 3.5 3.9 3.5 3.5 3.5  CL 91* 93* 97* 97* 100  CO2 22 23 21* 21* 22  GLUCOSE 102* 109* 96 99 96  BUN '14 15 12 12 11  '$ CREATININE 1.12* 1.15* 1.04* 1.13* 1.01*  CALCIUM 8.6* 8.6* 8.7* 8.6* 8.0*  MG  --  1.9 1.8 1.7 1.6*  PHOS  --  3.0 2.8 2.3* 4.8*  Studies: US THYROID  Result Date: 10/31/2022 CLINICAL DATA:  Hyperthyroid.  Known history of thyroid nodules. EXAM: THYROID ULTRASOUND TECHNIQUE: Ultrasound examination of the thyroid gland and adjacent soft tissues was performed. COMPARISON:  08/03/2021 FINDINGS: Parenchymal Echotexture: Mildly heterogenous Isthmus: 0.3 cm Right lobe: 6.3 x 2.8 x 2.5 cm Left lobe: 5.4 x 2.5 x 2.2 cm _________________________________________________________ Estimated total number of nodules >/= 1 cm: 4 Number of spongiform nodules >/=  2 cm not described below (TR1): 0 Number of mixed cystic and solid nodules >/= 1.5 cm not described below ([place]): 0 _________________________________________________________ Nodule # 1: Prior biopsy: No  Location: Right; Mid Maximum size: 2.5 cm; Other 2 dimensions: 1.7 x 1.8 cm, previously, 2.5 cm Composition: solid/almost completely solid (2) Echogenicity: isoechoic (1) Shape: not taller-than-wide (0) Margins: ill-defined (0) Echogenic foci: none (0) ACR TI-RADS total points: 3. ACR TI-RADS risk category:  TR3 (3 points). Significant change in size (>/= 20% in two dimensions and minimal increase of 2 mm): No Change in features: Yes Change in ACR TI-RADS risk category: Yes ACR TI-RADS recommendations: This nodule technically meets criteria for fine-needle aspiration. However, it is stable in size and appearance since the 2022 study and follow-up ultrasound in 1 year could also be considered. _________________________________________________________ Nodule # 2: Prior biopsy: No Location: Right; Inferior Maximum size: 3.7 cm; Other 2 dimensions: 3.3 x 3.3 cm, previously, 3.5 cm Composition: solid/almost completely solid (2) Echogenicity: isoechoic (1) Shape: not taller-than-wide (0) Margins: ill-defined (0) Echogenic foci: none (0) ACR TI-RADS total points: 3. ACR TI-RADS risk category:  TR3 (3 points). Significant change in size (>/= 20% in two dimensions and minimal increase of 2 mm): No Change in features: Yes Change in ACR TI-RADS risk category: Yes ACR TI-RADS recommendations: This nodule also technically meets criteria for fine-needle aspiration. It is relatively stable in size and appears similar to the prior study. One year follow-up ultrasound could also be considered. _________________________________________________________ Nodule # 3: Prior biopsy: No Location: Left; Inferior Maximum size: 2.1 cm; Other 2 dimensions: 1.9 x 2.1 cm, previously, 2.4 cm Composition: solid/almost completely solid (2) Echogenicity: isoechoic (1) Shape: not taller-than-wide (0) Margins: smooth (0) Echogenic foci: none (0) ACR TI-RADS total points: 3. ACR TI-RADS risk category:  TR3 (3 points). Significant change in size (>/= 20%  in two dimensions and minimal increase of 2 mm): No Change in features: No Change in ACR TI-RADS risk category: No ACR TI-RADS recommendations: *Given size (>/= 1.5 - 2.4 cm) and appearance, a follow-up ultrasound in 1 year should be considered based on TI-RADS criteria. _________________________________________________________ Nodule # 4: Location: Left; Inferior Maximum size: 1.2 cm; Other 2 dimensions: 1.2 x 0.9 cm Composition: solid/almost completely solid (2) Echogenicity: isoechoic (1) Shape: not taller-than-wide (0) Margins: smooth (0) Echogenic foci: none (0) ACR TI-RADS total points: 3. ACR TI-RADS risk category: TR3 (3 points). ACR TI-RADS recommendations: Given size (<1.4 cm) and appearance, this nodule does NOT meet TI-RADS criteria for biopsy or dedicated follow-up. _________________________________________________________ No enlarged or abnormal appearing lymph nodes are identified. IMPRESSION: 1. Nodules 1 and 2 in the right lobe meet criteria for fine-needle aspiration. However, these are stable in size and appearance since the prior study in 2022 and a 1 year follow-up ultrasound could also be considered. 2. Stable 2.1 cm left inferior thyroid nodule (nodule 3). Recommend 1 year follow-up ultrasound. 3. A 1.2 cm left inferior nodule not previously described does not meet criteria for biopsy or dedicated follow-up. The above is in keeping with the  ACR TI-RADS recommendations - J Am Coll Radiol 2017;14:587-595. Electronically Signed   By: Aletta Edouard M.D.   On: 10/31/2022 16:18    Scheduled Meds:  aspirin EC  81 mg Oral Daily   atorvastatin  40 mg Oral Daily   carvedilol  6.25 mg Oral BID WC   enoxaparin (LOVENOX) injection  40 mg Subcutaneous [id number]   folic acid  1 mg Oral Daily   gabapentin  300 mg Oral BID   lacosamide  150 mg Oral BID   levETIRAcetam  750 mg Oral BID   losartan  100 mg Oral Daily   methimazole  5 mg Oral BID   Followed by   Derrill Memo ON 11/03/2022] methimazole  5 mg Oral  Daily   saccharomyces boulardii  250 mg Oral BID   sodium chloride  2 g Oral TID WC   Vitamin D (Ergocalciferol)  50,000 Units Oral Q7 days   Continuous Infusions:  magnesium sulfate bolus IVPB     PRN Meds: acetaminophen **OR** acetaminophen, ondansetron **OR** ondansetron (ZOFRAN) IV, oxyCODONE  Time spent: 35 minutes  Author: Val Riles. MD [facility] 11/01/2022 8:39 AM  To reach On-call, see care teams to locate the attending and reach out to them via [email or web address]. If 7PM-7AM, please contact night-coverage If you still have difficulty reaching the attending provider, please page the [facility] (Director on Call) for [facility] on amion for assistance.

## 2022-11-02 DIAGNOSIS — E871 Hypo-osmolality and hyponatremia: Secondary | ICD-10-CM | POA: Diagnosis not present

## 2022-11-02 LAB — BASIC METABOLIC PANEL
Anion gap: 7 (ref 5–15)
BUN: 11 mg/dL (ref 8–23)
CO2: 22 mmol/L (ref 22–32)
Calcium: 8.5 mg/dL — ABNORMAL LOW (ref 8.9–10.3)
Chloride: 101 mmol/L (ref 98–111)
Creatinine, Ser: 0.98 mg/dL (ref 0.44–1.00)
GFR, Estimated: >60 mL/min (ref >60)
Glucose, Bld: 97 mg/dL (ref 70–99)
Potassium: 3.6 mmol/L (ref 3.5–5.1)
Sodium: 130 mmol/L — ABNORMAL LOW (ref 135–145)

## 2022-11-02 LAB — CBC
HCT: 23.3 % — ABNORMAL LOW (ref 36.0–46.0)
Hemoglobin: 7.7 g/dL — ABNORMAL LOW (ref 12.0–15.0)
MCH: 28.8 pg (ref 26.0–34.0)
MCHC: 33 g/dL (ref 30.0–36.0)
MCV: 87.3 fL (ref 80.0–100.0)
Platelets: 223 10*3/uL (ref 150–400)
RBC: 2.67 MIL/uL — ABNORMAL LOW (ref 3.87–5.11)
RDW: 14.4 % (ref 11.5–15.5)
WBC: 6.3 10*3/uL (ref 4.0–10.5)
nRBC: 0 % (ref 0.0–0.2)

## 2022-11-02 LAB — GASTROINTESTINAL PANEL BY PCR, STOOL (REPLACES STOOL CULTURE)

## 2022-11-02 LAB — C DIFFICILE QUICK SCREEN W PCR REFLEX
C Diff antigen: NEGATIVE
C Diff interpretation: NOT DETECTED
C Diff toxin: NEGATIVE

## 2022-11-02 LAB — PHOSPHORUS: Phosphorus: 3.2 mg/dL (ref 2.5–4.6)

## 2022-11-02 LAB — T4, FREE: Free T4: 1.17 ng/dL — ABNORMAL HIGH (ref 0.61–1.12)

## 2022-11-02 LAB — MAGNESIUM: Magnesium: 1.9 mg/dL (ref 1.7–2.4)

## 2022-11-02 MED ORDER — LOPERAMIDE HCL 2 MG PO CAPS
2.0000 mg | ORAL_CAPSULE | Freq: Four times a day (QID) | ORAL | Status: DC | PRN
  Administered 2022-11-03: 2 mg via ORAL
  Filled 2022-11-02: qty 1

## 2022-11-02 NOTE — Progress Notes (Signed)
[facility] Progress Note  Patient: Anne Phillips    [id number]  DOA: 10/28/2022     Date of Service: the patient was seen and examined on 11/02/2022  Chief Complaint  Patient presents with   Altered Mental Status   Brief hospital course: Anne Phillips is a 72 y.o. female with medical history significant of seizure disorder, hypertension, hyperlipidemia, CAD, breast cancer s/p mastectomy, hypothyroidism, who presents to the ED with complaints of altered mental status.  Patient is very poor historian, she fell couple of days ago, does not remember the details, she had fracture of right clavicle.  Patient has remote history of seizures.  As per patient's sister patient has been confused recently. ED workup: Hyponatremia sodium 124, creatinine 1.23 slightly elevated, CK2 43 CT right shoulder: Comminuted, displaced and overriding fracture of the distal 3rd of the right clavicular shaft with moderate surrounding soft tissue swelling. 2. Deformity of the greater tuberosity may relate to an old fracture as correlated with previous radiographs. A mild superimposed acute impaction fracture is difficult to exclude. [facility] contacted for admission for symptomatic hyponatremia.   Assessment and Plan:  # Hyponatremia, unknown cause could be SIADH versus hypovolemic hyponatremia Sodium 122, serum osmolality 261 low, currently, urine 70-91 S/p 500 mL NS bolus, Na 124 --128--126--131--130 Continue to monitor sodium level daily Nephrology consulted, no indication for 3% saline at this time 2/28 started sodium chloride tablets 1 g p.o. 3 times daily 3/1 increase sodium chloride 2 g p.o. 3 times daily  # Hypomagnesemia, mag repleted. # Hypophosphatemia due to nutritional deficiency.  Phos repleted. Monitor electrolytes and replete as needed.  # Acute metabolic encephalopathy could be secondary to hyponatremia Continue fall precautions and seizure precautions Continue supportive care   #  Seizure disorder Patient has history of generalized tonic-clonic seizures as well as focal seizures with left arm jerking. Continue Keppra and Vimpat Continue seizure precautions Case was discussed with neurology, recommended continue current treatment, if patient is having breakthrough episodes then we could give extra dose of Vimpat 100 mg and increase dose of Vimpat to 200 mg twice daily   Right clavicle fracture and deformity of greater tuberosity Continue as needed medication for pain control Immobilized right shoulder and nonweightbearing Seen by orthopedic surgery, recommended nonsurgical management  CKD stage IIIb Currently renal which is at baseline, continue to monitor.  Hypertension, HFpEF, no signs of volume overload at this time Held Lasix for now Continued Coreg and losartan Monitor BP and titrate medications accordingly   Anemia of chronic disease Hb 10.0-8.2--7.8--7.7  continue to monitor Iron studies within normal range, folic acid 6.2 at lower end, B12 within normal range Started folic acid 1 mg p.o. daily   Vitamin D deficiency: started vitamin D 50,000 units p.o. weekly, follow with PCP to repeat vitamin D level after 3 to 6 months.   Hyperthyroidism, TSH 0.34 low and Free T4 level 1.37 elevated 2/29 started Methimazole 5 mg po BID x 3 days and 5 po daily  free T3 level  FT4 level  1.37--1.11--1.19--117  fluctuating Thyroid antibodies negative US Thyroid: Multiple nodules in the bilateral lobes, either patient needs a biopsy versus follow-up ultrasound in 1 year.  Please review complete report for details. Patient stated that she has history of overactive thyroid and she was on medication which she stopped in 2019.  Recommended to follow with endocrinologist as an outpatient.  Diarrhea patient developed loose stools on 2/29 per RN C. difficile and GI pathogen was ordered  but patient's diarrhea resolved so sample was not sent. Started probiotics 2/29 s/p NS  75 ml/hr for 12 hours 3/3 stool negative for C. difficile and GI pathogen Imodium as needed ordered   Body mass index is 22.48 kg/m.  Interventions:       Diet: Regular diet DVT Prophylaxis: Subcutaneous Lovenox   Advance goals of care discussion: Full code  Family Communication: family was present at bedside, at the time of interview.  The pt provided permission to discuss medical plan with the family. Opportunity was given to ask question and all questions were answered satisfactorily.   Disposition:  Pt is from SNF, admitted with hyponatremia and AMS, still has low sodium, which precludes a safe discharge. Discharge to SNF, when stable and cleared by nephrology..  Subjective: No significant overnight events, patient had diarrhea yesterday but did not move bowels after afternoon, in the morning time patient was again having diarrhea, RN was advised to send stool sample.  Patient denies any abdominal pain, no nausea vomiting.  No chest pain or palpitation, no shortness of breath.  Physical Exam: General: NAD, lying comfortably Appear in no distress, affect appropriate Eyes: PERRLA ENT: Oral Mucosa Clear, moist  Neck: no JVD,  Cardiovascular: S1 and S2 Present, no Murmur,  Respiratory: good respiratory effort, Bilateral Air entry equal and Decreased, no Crackles, no wheezes Abdomen: Bowel Sound present, Soft and no tenderness,  Skin: no rashes Extremities: no Pedal edema, no calf tenderness Neurologic: without any new focal findings Gait not checked due to patient safety concerns  Vitals:   11/01/22 1535 11/01/22 1535 11/02/22 0440 11/02/22 0749  BP: 123/62 123/62 (!) 145/64 (!) 145/71  Pulse: 72 74 65 65  Resp: '16 16 20 17  '$ Temp: 98 F (36.7 C) 98 F (36.7 C) 98.5 F (36.9 C) 98.4 F (36.9 C)  TempSrc:      SpO2: 100% 100% 100% 100%  Weight:      Height:        Intake/Output Summary (Last 24 hours) at 11/02/2022 1326 Last data filed at 11/02/2022 [id number] Gross per  24 hour  Intake --  Output 100 ml  Net -100 ml   Filed Weights   10/28/22 1057  Weight: 54 kg    Data Reviewed: I have personally reviewed and interpreted daily labs, tele strips, imagings as discussed above. I reviewed all nursing notes, pharmacy notes, vitals, pertinent old records I have discussed plan of care as described above with RN and patient/family.  CBC: Recent Labs  Lab 10/28/22 1235 10/29/22 0326 10/30/22 0630 10/31/22 0329 11/01/22 0651 11/02/22 0552  WBC 7.1 6.8 6.2 6.0 6.1 6.3  NEUTROABS 5.9 4.9  --   --   --   --   HGB 10.0* 8.2* 7.9* 7.8* 8.0* 7.7*  HCT 30.2* 24.2* 24.0* 23.6* 24.2* 23.3*  MCV 85.3 84.0 85.1 86.4 86.1 87.3  PLT 223 205 224 211 210 [id number]   Basic Metabolic Panel: Recent Labs  Lab 10/29/22 0917 10/30/22 0630 10/31/22 0329 11/01/22 0651 11/02/22 0552  NA 123* 128* 126* 131* 130*  K 3.9 3.5 3.5 3.5 3.6  CL 93* 97* 97* 100 101  CO2 23 21* 21* 22 22  GLUCOSE 109* 96 99 96 97  BUN '15 12 12 11 11  '$ CREATININE 1.15* 1.04* 1.13* 1.01* 0.98  CALCIUM 8.6* 8.7* 8.6* 8.0* 8.5*  MG 1.9 1.8 1.7 1.6* 1.9  PHOS 3.0 2.8 2.3* 4.8* 3.2    Studies: No results found.  Scheduled Meds:  aspirin EC  81 mg Oral Daily   atorvastatin  40 mg Oral Daily   carvedilol  6.25 mg Oral BID WC   enoxaparin (LOVENOX) injection  40 mg Subcutaneous [id number]   folic acid  1 mg Oral Daily   gabapentin  300 mg Oral BID   lacosamide  150 mg Oral BID   levETIRAcetam  750 mg Oral BID   losartan  100 mg Oral Daily   [START ON 11/03/2022] methimazole  5 mg Oral Daily   saccharomyces boulardii  250 mg Oral BID   sodium chloride  2 g Oral TID WC   Vitamin D (Ergocalciferol)  50,000 Units Oral Q7 days   Continuous Infusions:   PRN Meds: acetaminophen **OR** acetaminophen, ondansetron **OR** ondansetron (ZOFRAN) IV, oxyCODONE  Time spent: 35 minutes  Author: Val Riles. MD [facility] 11/02/2022 1:26 PM  To reach On-call, see care teams to locate the  attending and reach out to them via [email or web address]. If 7PM-7AM, please contact night-coverage If you still have difficulty reaching the attending provider, please page the [facility] (Director on Call) for [facility] on amion for assistance.

## 2022-11-02 NOTE — Plan of Care (Signed)

## 2022-11-02 NOTE — Plan of Care (Signed)

## 2022-11-03 DIAGNOSIS — E871 Hypo-osmolality and hyponatremia: Secondary | ICD-10-CM | POA: Diagnosis not present

## 2022-11-03 LAB — CBC
HCT: 23.2 % — ABNORMAL LOW (ref 36.0–46.0)
Hemoglobin: 7.5 g/dL — ABNORMAL LOW (ref 12.0–15.0)
MCH: 28.7 pg (ref 26.0–34.0)
MCHC: 32.3 g/dL (ref 30.0–36.0)
MCV: 88.9 fL (ref 80.0–100.0)
Platelets: 200 10*3/uL (ref 150–400)
RBC: 2.61 MIL/uL — ABNORMAL LOW (ref 3.87–5.11)
RDW: 14.3 % (ref 11.5–15.5)
WBC: 5.5 10*3/uL (ref 4.0–10.5)
nRBC: 0 % (ref 0.0–0.2)

## 2022-11-03 LAB — PHOSPHORUS: Phosphorus: 2.6 mg/dL (ref 2.5–4.6)

## 2022-11-03 LAB — BASIC METABOLIC PANEL
Anion gap: 7 (ref 5–15)
BUN: 12 mg/dL (ref 8–23)
CO2: 21 mmol/L — ABNORMAL LOW (ref 22–32)
Calcium: 7.7 mg/dL — ABNORMAL LOW (ref 8.9–10.3)
Chloride: 97 mmol/L — ABNORMAL LOW (ref 98–111)
Creatinine, Ser: 0.97 mg/dL (ref 0.44–1.00)
GFR, Estimated: >60 mL/min (ref >60)
Glucose, Bld: 109 mg/dL — ABNORMAL HIGH (ref 70–99)
Potassium: 3.7 mmol/L (ref 3.5–5.1)
Sodium: 125 mmol/L — ABNORMAL LOW (ref 135–145)

## 2022-11-03 LAB — MAGNESIUM: Magnesium: 1.6 mg/dL — ABNORMAL LOW (ref 1.7–2.4)

## 2022-11-03 LAB — T4, FREE: Free T4: 1.08 ng/dL (ref 0.61–1.12)

## 2022-11-03 MED ORDER — SODIUM CHLORIDE 0.9 % IV SOLN
100.0000 mg | Freq: Once | INTRAVENOUS | Status: AC
  Administered 2022-11-03: 100 mg via INTRAVENOUS
  Filled 2022-11-03: qty 10

## 2022-11-03 MED ORDER — SODIUM CHLORIDE 0.9 % IV SOLN: INTRAVENOUS | Status: DC

## 2022-11-03 MED ORDER — MAGNESIUM SULFATE 4 GM/100ML IV SOLN
4.0000 g | Freq: Once | INTRAVENOUS | Status: AC
  Administered 2022-11-03: 4 g via INTRAVENOUS
  Filled 2022-11-03: qty 100

## 2022-11-03 MED ORDER — LACOSAMIDE 50 MG PO TABS
200.0000 mg | ORAL_TABLET | Freq: Two times a day (BID) | ORAL | Status: DC
  Administered 2022-11-03 – 2022-11-05 (×4): 200 mg via ORAL
  Filled 2022-11-03 (×4): qty 4

## 2022-11-03 NOTE — Care Management Important Message (Signed)
Important Message  Patient Details  Name: Anne Phillips MRN: [id number] Date of Birth: 10-Apr-1951   Medicare Important Message Given:  Yes     Loann Quill 11/03/2022, 3:41 PM

## 2022-11-03 NOTE — Progress Notes (Signed)
Physical Therapy Treatment Patient Details Name: Anne Phillips MRN: [id number] DOB: 02-27-1951 Today's Date: 11/03/2022   History of Present Illness Anne Phillips is a 72 y.o. female with medical history significant of seizure disorder, hypertension, hyperlipidemia, CAD, breast cancer s/p mastectomy, hypothyroidism, who presents to the ED with complaints of altered mental status and following fall at her STR. Found to have Comminuted, displaced and overriding fracture of the distal 3rd of the right clavicular shaft    PT Comments    Pt received in bed laying on Left side. Pt states she has been using the [facility] independently and putting weight through R UE during transfers and with RW. Pt educated at length regarding NWB precautions and reason compliance is important. Continued education beneficial. Attempted to have pt use SPC in L UE for gait, pt declined stating her arm was too tremorous.  Pt ambulated without AD 157f with MinA at times to maintain balance, very unsteady with fast cadence and high fall risk. Pt tremorous throughout with poor safety awareness. Continue to recommend SNF once medically cleared.   Recommendations for follow up therapy are one component of a multi-disciplinary discharge planning process, led by the attending physician.  Recommendations may be updated based on patient status, additional functional criteria and insurance authorization.  Follow Up Recommendations  Skilled nursing-short term rehab (<3 hours/day) Can patient physically be transported by private vehicle: Yes   Assistance Recommended at Discharge Frequent or constant Supervision/Assistance  Patient can return home with the following A little help with walking and/or transfers;A little help with bathing/dressing/bathroom;Assistance with cooking/housework;Assist for transportation;Help with stairs or ramp for entrance   Equipment Recommendations  Rolling walker (2 wheels)    Recommendations for Other  Services       Precautions / Restrictions Precautions Precautions: Fall Restrictions Weight Bearing Restrictions: Yes RUE Weight Bearing: Non weight bearing     Mobility  Bed Mobility Overal bed mobility: Needs Assistance Bed Mobility: Supine to Sit, Sit to Supine   Sidelying to sit: Supervision Supine to sit: Supervision Sit to supine: Supervision   General bed mobility comments: Consistent cues for NWB R LE, pt non-compliant    Transfers Overall transfer level: Needs assistance Equipment used: None Transfers: Sit to/from Stand, Bed to chair/wheelchair/BSC Sit to Stand: Min guard Stand pivot transfers: Min guard         General transfer comment: Attempted use of SPC for safety, pt declined stating she has tremors in R UE and unable to stabilize cane.    Ambulation/Gait Ambulation/Gait assistance: Min assist, Min guard Gait Distance (Feet): 150 Feet Assistive device: None Gait Pattern/deviations: Step-through pattern, Staggering left, Staggering right (Very unsafe and impulsive) Gait velocity: increased and unsafe     General Gait Details: fast cadence, unsteady, tremorous throughout, high fall risk   Stairs             Wheelchair Mobility    Modified Rankin (Stroke Patients Only)       Balance Overall balance assessment: Needs assistance Sitting-balance support: No upper extremity supported, Feet supported Sitting balance-Leahy Scale: Good     Standing balance support: No upper extremity supported Standing balance-Leahy Scale: Fair Standing balance comment:  (MinA to prevent LOB during dynamic activities and ambulation)                            Cognition Arousal/Alertness: Awake/alert Behavior During Therapy: WFL for tasks assessed/performed, Anxious, Impulsive Overall Cognitive Status: Within Functional  Limits for tasks assessed                                 General Comments: Generally oriented to basic  information; follows simple commands; limited insight/safety awareness        Exercises Other Exercises Other Exercises:  (educated on importance of sling and proper positioning)    General Comments General comments (skin integrity, edema, etc.):  (Pt required education on reason for NWB R UE, pt stated she has been using her arm and the RW for support.)      Pertinent Vitals/Pain Pain Assessment Pain Assessment: 0-10 Pain Score: 3  Pain Location:  (Right shoulder) Pain Descriptors / Indicators: Aching Pain Intervention(s): Limited activity within patient's tolerance (repositioned Right arm sling)    Home Living                          Prior Function            PT Goals (current goals can now be found in the care plan section) Acute Rehab PT Goals Patient Stated Goal: improve her mobility.    Frequency    Min 2X/week      PT Plan      Co-evaluation              AM-PAC PT "6 Clicks" Mobility   Outcome Measure  Help needed turning from your back to your side while in a flat bed without using bedrails?: None Help needed moving from lying on your back to sitting on the side of a flat bed without using bedrails?: None Help needed moving to and from a bed to a chair (including a wheelchair)?: A Little Help needed standing up from a chair using your arms (e.g., wheelchair or bedside chair)?: A Little Help needed to walk in hospital room?: A Little Help needed climbing 3-5 steps with a railing? : A Little 6 Click Score: 20    End of Session Equipment Utilized During Treatment: Gait belt Activity Tolerance: Patient tolerated treatment well Patient left: in bed;with call bell/phone within reach (Pt is utilizing [facility] independently, nursing aware) Nurse Communication: Mobility status;Precautions (Pt requesting an increase in [facility] Meds) PT Visit Diagnosis: Difficulty in walking, not elsewhere classified (R26.2);Unsteadiness on feet (R26.81);Muscle  weakness (generalized) (M62.81)     Time: 1410-1430 PT Time Calculation (min) (ACUTE ONLY): 20 min  Charges:  $Gait Training: 8-22 mins                    Mikel Cella, PTA  [name] 11/03/2022, 2:44 PM

## 2022-11-03 NOTE — Progress Notes (Signed)
[facility]  ROUNDING NOTE   Subjective:   Anne Phillips is a 72 y.o.  female with past medical history including hypertension, CAD, seizure disorder, breast cancer with mastectomy, and hypothyroidism.  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    Patient seen laying in bed, no family at bedside Alert and irritable States she is ready to go home  Sodium 125  Objective:  Vital signs in last 24 hours:  Temp:  [98.3 F (36.8 C)-98.5 F (36.9 C)] 98.4 F (36.9 C) (03/04 0801) Pulse Rate:  [77-91] 77 (03/04 0801) Resp:  [16-18] 18 (03/04 0801) BP: (132-151)/(60-84) 151/65 (03/04 0801) SpO2:  [99 %-100 %] 100 % (03/04 0801)  Weight change:  Filed Weights   10/28/22 1057  Weight: 54 kg    Intake/Output: I/O last 3 completed shifts: In: -  Out: 100 [Urine:100]   Intake/Output this shift:  No intake/output data recorded.  Physical Exam: General: NAD  Head: Normocephalic, atraumatic. Moist oral mucosal membranes  Eyes: Anicteric  Lungs:  Clear to auscultation, normal effort, room air  Heart: Regular rate and rhythm  Abdomen:  Soft, nontender, nondistended  Extremities: No peripheral edema.  Neurologic: Alert and oriented, moving all four extremities  Skin: No lesions  Access: None    Basic Metabolic Panel: Recent Labs  Lab 10/30/22 0630 10/31/22 0329 11/01/22 0651 11/02/22 0552 11/03/22 0509  NA 128* 126* 131* 130* 125*  K 3.5 3.5 3.5 3.6 3.7  CL 97* 97* 100 101 97*  CO2 21* 21* 22 22 21*  GLUCOSE 96 99 96 97 109*  BUN '12 12 11 11 12  '$ CREATININE 1.04* 1.13* 1.01* 0.98 0.97  CALCIUM 8.7* 8.6* 8.0* 8.5* 7.7*  MG 1.8 1.7 1.6* 1.9  1.6*  PHOS 2.8 2.3* 4.8* 3.2 2.6     Liver Function Tests: Recent Labs  Lab 10/28/22 1235  AST 39  ALT 13  ALKPHOS 137*  BILITOT 1.4*  PROT 8.1  ALBUMIN 4.2    No results for input(s): "LIPASE", "AMYLASE" in the last 168 hours. No results for input(s): "AMMONIA" in the last 168 hours.  CBC: Recent Labs  Lab 10/28/22 1235 10/29/22 0326 10/30/22 0630 10/31/22 0329 11/01/22 0651 11/02/22 0552 11/03/22 0509  WBC 7.1 6.8 6.2 6.0 6.1 6.3 5.5  NEUTROABS 5.9 4.9  --   --   --   --   --   HGB 10.0* 8.2* 7.9* 7.8* 8.0* 7.7* 7.5*  HCT 30.2* 24.2* 24.0* 23.6* 24.2* 23.3* 23.2*  MCV 85.3 84.0 85.1 86.4 86.1 87.3 88.9  PLT 223 205 224 211 210 223 200     Cardiac Enzymes: Recent Labs  Lab 10/29/22 0917  CKTOTAL 243*     BNP: Invalid input(s): "POCBNP"  CBG: No results for input(s): "GLUCAP" in the last 168 hours.  Microbiology: Results for orders placed or performed during the hospital encounter of 10/28/22  MRSA Next Gen by PCR, Nasal     Status: None   Collection Time: 10/28/22  4:00 PM   Specimen: Nasal Mucosa; Nasal Swab  Result Value Ref Range Status   MRSA by PCR Next Gen NOT DETECTED NOT DETECTED Final    Comment: (NOTE) The GeneXpert MRSA Assay (FDA approved for NASAL specimens  only), is one component of a comprehensive MRSA colonization surveillance program. It is not intended to diagnose MRSA infection nor to guide or monitor treatment for MRSA infections. Test performance is not FDA approved in patients less than 28 years old. Performed at [facility], [place], [place] [address]   C Difficile Quick Screen w PCR reflex     Status: None   Collection Time: 11/02/22 10:30 AM   Specimen: STOOL  Result Value Ref Range Status   C Diff antigen NEGATIVE NEGATIVE Final   C Diff toxin NEGATIVE NEGATIVE Final   C Diff interpretation No C. difficile detected.  Final    Comment: Performed at [facility], [address] [place], [place] [address]  Gastrointestinal Panel by PCR , Stool     Status: None   Collection Time: 11/02/22 10:30 AM   Specimen: Stool  Result Value Ref Range Status   Campylobacter species NOT DETECTED NOT DETECTED Final   Plesimonas shigelloides NOT DETECTED NOT DETECTED Final   Salmonella species NOT DETECTED NOT DETECTED Final   Yersinia enterocolitica NOT DETECTED NOT DETECTED Final   Vibrio species NOT DETECTED NOT DETECTED Final   Vibrio cholerae NOT DETECTED NOT DETECTED Final   Enteroaggregative E coli (EAEC) NOT DETECTED NOT DETECTED Final   Enteropathogenic E coli (EPEC) NOT DETECTED NOT DETECTED Final   Enterotoxigenic E coli (ETEC) NOT DETECTED NOT DETECTED Final   Shiga like toxin producing E coli (STEC) NOT DETECTED NOT DETECTED Final   Shigella/Enteroinvasive E coli (EIEC) NOT DETECTED NOT DETECTED Final   Cryptosporidium NOT DETECTED NOT DETECTED Final   Cyclospora cayetanensis NOT DETECTED NOT DETECTED Final   Entamoeba histolytica NOT DETECTED NOT DETECTED Final   Giardia lamblia NOT DETECTED NOT DETECTED Final   Adenovirus F40/41 NOT DETECTED NOT DETECTED Final   Astrovirus NOT DETECTED NOT DETECTED Final   Norovirus GI/GII NOT DETECTED NOT DETECTED Final   Rotavirus A NOT DETECTED NOT DETECTED Final   Sapovirus (I, II, IV, and V) NOT DETECTED NOT DETECTED Final    Comment: Performed at [facility], [address] [place], [place] [address]    Coagulation Studies: No results for input(s): "LABPROT", "INR" in the last 72 hours.  Urinalysis: No results for input(s): "COLORURINE", "LABSPEC", "PHURINE", "GLUCOSEU", "HGBUR", "BILIRUBINUR", "KETONESUR", "PROTEINUR", "UROBILINOGEN", "NITRITE", "LEUKOCYTESUR" in the last 72 hours.  Invalid input(s): "APPERANCEUR"     Imaging: No results found.   Medications:    sodium chloride 75 mL/hr at 11/03/22 1349   lacosamide (VIMPAT) IV      aspirin EC  81 mg Oral Daily   atorvastatin  40 mg Oral Daily    carvedilol  6.25 mg Oral BID WC   enoxaparin (LOVENOX) injection  40 mg Subcutaneous [id number]   folic acid  1 mg Oral Daily   gabapentin  300 mg Oral BID   lacosamide  200 mg Oral BID   levETIRAcetam  750 mg Oral BID   losartan  100 mg Oral Daily   methimazole  5 mg Oral Daily   saccharomyces boulardii  250 mg Oral BID   sodium chloride  2 g Oral TID WC   Vitamin D (Ergocalciferol)  50,000 Units Oral Q7 days   acetaminophen **OR** acetaminophen, loperamide, ondansetron **OR** ondansetron (ZOFRAN) IV, oxyCODONE  Assessment/ Plan:  Anne Phillips is a 72 y.o.  female with past medical history including hypertension, CAD, seizure disorder, breast cancer with mastectomy, and hypothyroidism.  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  Will perform medication review to evaluate other contributing factors.  Sodium on admission 122.   Sodium 125 today Will order normal saline to 75 mL/h. Continue salt tabs 2 g 3 times daily. Patient encouraged to maintain oral intake. Due to fluctuating oral intake, patient may experience hyponatremia frequently.  2.  Chronic kidney disease stage IIIa.  Patient currently at baseline renal function.  Not currently followed by outpatient nephrology.    Renal function remained stable  3. Anemia of chronic kidney disease Lab Results  Component Value Date   HGB 7.5 (L) 11/03/2022    Hemoglobin below desired target.  Will monitor closely for now   LOS: 5 [place] 3/4/20242:30 PM

## 2022-11-03 NOTE — Progress Notes (Signed)
[facility] Progress Note  Patient: Anne Phillips    [id number]  DOA: 10/28/2022     Date of Service: the patient was seen and examined on 11/03/2022  Chief Complaint  Patient presents with   Altered Mental Status   Brief hospital course: Anne Phillips is a 72 y.o. female with medical history significant of seizure disorder, hypertension, hyperlipidemia, CAD, breast cancer s/p mastectomy, hypothyroidism, who presents to the ED with complaints of altered mental status.  Patient is very poor historian, she fell couple of days ago, does not remember the details, she had fracture of right clavicle.  Patient has remote history of seizures.  As per patient's sister patient has been confused recently. ED workup: Hyponatremia sodium 124, creatinine 1.23 slightly elevated, CK2 43 CT right shoulder: Comminuted, displaced and overriding fracture of the distal 3rd of the right clavicular shaft with moderate surrounding soft tissue swelling. 2. Deformity of the greater tuberosity may relate to an old fracture as correlated with previous radiographs. A mild superimposed acute impaction fracture is difficult to exclude. [facility] contacted for admission for symptomatic hyponatremia.   Assessment and Plan:  # Hyponatremia, unknown cause could be SIADH versus hypovolemic hyponatremia Sodium 122, serum osmolality 261 low, currently, urine 70-91 S/p 500 mL NS bolus, Na 124 --128--126--131--130--125 Continue to monitor sodium level daily Nephrology consulted, no indication for 3% saline at this time 2/28 started sodium chloride tablets 1 g p.o. 3 times daily 3/1 increase sodium chloride 2 g p.o. 3 times daily  # Hypomagnesemia, mag repleted. # Hypophosphatemia due to nutritional deficiency.  Phos repleted. Monitor electrolytes and replete as needed.  # Acute metabolic encephalopathy could be secondary to hyponatremia Continue fall precautions and seizure precautions Continue supportive  care   # Seizure disorder Patient has history of generalized tonic-clonic seizures as well as focal seizures with left arm jerking. Continue Keppra and Vimpat Continue seizure precautions Case was discussed with neurology, recommended continue current treatment, if patient is having breakthrough episodes then we could give extra dose of Vimpat 100 mg and increase dose of Vimpat to 200 mg twice daily   Right clavicle fracture and deformity of greater tuberosity Continue as needed medication for pain control Immobilized right shoulder and nonweightbearing Seen by orthopedic surgery, recommended nonsurgical management  CKD stage IIIb Currently renal which is at baseline, continue to monitor.  Hypertension, HFpEF, no signs of volume overload at this time Held Lasix for now Continued Coreg and losartan Monitor BP and titrate medications accordingly   Anemia of chronic disease Hb 10.0-8.2--7.8--7.7  continue to monitor Iron studies within normal range, folic acid 6.2 at lower end, B12 within normal range Started folic acid 1 mg p.o. daily   Vitamin D deficiency: started vitamin D 50,000 units p.o. weekly, follow with PCP to repeat vitamin D level after 3 to 6 months.   Hyperthyroidism, TSH 0.34 low and Free T4 level 1.37 elevated 2/29 started Methimazole 5 mg po BID x 3 days and 5 po daily  free T3 level  FT4 level  1.37--1.11--1.19--117  fluctuating Thyroid antibodies negative US Thyroid: Multiple nodules in the bilateral lobes, either patient needs a biopsy versus follow-up ultrasound in 1 year.  Please review complete report for details. Patient stated that she has history of overactive thyroid and she was on medication which she stopped in 2019.  Recommended to follow with endocrinologist as an outpatient.  Diarrhea patient developed loose stools on 2/29 per RN C. difficile and GI pathogen was ordered  but patient's diarrhea resolved so sample was not sent. Started  probiotics 2/29 s/p NS 75 ml/hr for 12 hours 3/3 stool negative for C. difficile and GI pathogen Imodium as needed ordered   Body mass index is 22.48 kg/m.  Interventions:       Diet: Regular diet DVT Prophylaxis: Subcutaneous Lovenox   Advance goals of care discussion: Full code  Family Communication: family was present at bedside, at the time of interview.  The pt provided permission to discuss medical plan with the family. Opportunity was given to ask question and all questions were answered satisfactorily.   Disposition:  Pt is from SNF, admitted with hyponatremia and AMS, still has low sodium, which precludes a safe discharge. Discharge to SNF, when stable and cleared by nephrology..  Subjective: No significant overnight events, patient only had 1 episode of diarrhea yesterday, no more diarrhea episodes since then.  Denies any nausea vomiting, no abdominal pain, no chest pain or palpitation no shortness of breath. Patient is very frustrated, sick of staying longer in the hospital, would like to be discharged.  Patient was advised that her sodium is very low and she will not be able to do physical therapy, and there is a risk for readmission as well.  Her magnesium is low as well so we will give her IV magnesium and continue to monitor sodium level today, plan for disposition tomorrow a.m. if sodium level improves.  Physical Exam: General: NAD, lying comfortably Appear in no distress, affect appropriate Eyes: PERRLA ENT: Oral Mucosa Clear, moist  Neck: no JVD,  Cardiovascular: S1 and S2 Present, no Murmur,  Respiratory: good respiratory effort, Bilateral Air entry equal and Decreased, no Crackles, no wheezes Abdomen: Bowel Sound present, Soft and no tenderness,  Skin: no rashes Extremities: no Pedal edema, no calf tenderness Neurologic: without any new focal findings Gait not checked due to patient safety concerns  Vitals:   11/02/22 0749 11/02/22 1450 11/02/22 2345  11/03/22 0801  BP: (!) 145/71 139/84 132/60 (!) 151/65  Pulse: 65 91 79 77  Resp: '17 16 16 18  '$ Temp: 98.4 F (36.9 C) 98.5 F (36.9 C) 98.3 F (36.8 C) 98.4 F (36.9 C)  TempSrc:      SpO2: 100% 100% 99% 100%  Weight:      Height:       No intake or output data in the 24 hours ending 11/03/22 1313  Filed Weights   10/28/22 1057  Weight: 54 kg    Data Reviewed: I have personally reviewed and interpreted daily labs, tele strips, imagings as discussed above. I reviewed all nursing notes, pharmacy notes, vitals, pertinent old records I have discussed plan of care as described above with RN and patient/family.  CBC: Recent Labs  Lab 10/28/22 1235 10/29/22 0326 10/30/22 0630 10/31/22 0329 11/01/22 0651 11/02/22 0552 11/03/22 0509  WBC 7.1 6.8 6.2 6.0 6.1 6.3 5.5  NEUTROABS 5.9 4.9  --   --   --   --   --   HGB 10.0* 8.2* 7.9* 7.8* 8.0* 7.7* 7.5*  HCT 30.2* 24.2* 24.0* 23.6* 24.2* 23.3* 23.2*  MCV 85.3 84.0 85.1 86.4 86.1 87.3 88.9  PLT 223 205 224 211 210 223 [id number]   Basic Metabolic Panel: Recent Labs  Lab 10/30/22 0630 10/31/22 0329 11/01/22 0651 11/02/22 0552 11/03/22 0509  NA 128* 126* 131* 130* 125*  K 3.5 3.5 3.5 3.6 3.7  CL 97* 97* 100 101 97*  CO2 21* 21* 22 22 21*  GLUCOSE 96 99 96 97 109*  BUN '12 12 11 11 12  '$ CREATININE 1.04* 1.13* 1.01* 0.98 0.97  CALCIUM 8.7* 8.6* 8.0* 8.5* 7.7*  MG 1.8 1.7 1.6* 1.9 1.6*  PHOS 2.8 2.3* 4.8* 3.2 2.6    Studies: No results found.  Scheduled Meds:  aspirin EC  81 mg Oral Daily   atorvastatin  40 mg Oral Daily   carvedilol  6.25 mg Oral BID WC   enoxaparin (LOVENOX) injection  40 mg Subcutaneous [id number]   folic acid  1 mg Oral Daily   gabapentin  300 mg Oral BID   lacosamide  150 mg Oral BID   levETIRAcetam  750 mg Oral BID   losartan  100 mg Oral Daily   methimazole  5 mg Oral Daily   saccharomyces boulardii  250 mg Oral BID   sodium chloride  2 g Oral TID WC   Vitamin D (Ergocalciferol)  50,000 Units Oral Q7  days   Continuous Infusions:  sodium chloride      PRN Meds: acetaminophen **OR** acetaminophen, loperamide, ondansetron **OR** ondansetron (ZOFRAN) IV, oxyCODONE  Time spent: 35 minutes  Author: Val Riles. MD [facility] 11/03/2022 1:13 PM  To reach On-call, see care teams to locate the attending and reach out to them via [email or web address]. If 7PM-7AM, please contact night-coverage If you still have difficulty reaching the attending provider, please page the [facility] (Director on Call) for [facility] on amion for assistance.

## 2022-11-03 NOTE — TOC Progression Note (Signed)
Transition of Care [facility]) - Progression Note    Patient Details  Name: Anne Phillips MRN: [id number] Date of Birth: 1951/06/01  Transition of Care [facility]) CM/SW [place], RN Phone Number: 11/03/2022, 8:32 AM  Clinical Narrative:     Ins approved to go to [employer], 3/1-3/5 [id number]       Expected Discharge Plan and Services                                               Social Determinants of Health ([place]) Interventions SDOH Screenings   Food Insecurity: No Food Insecurity (10/28/2022)  Housing: Low Risk  (10/28/2022)  Transportation Needs: No Transportation Needs (10/28/2022)  Utilities: Not At Risk (10/28/2022)  Tobacco Use: High Risk (10/28/2022)    Readmission Risk Interventions    10/12/2022   11:51 AM  Readmission Risk Prevention Plan  PCP or Specialist Appt within 3-5 Days Complete  Social Work Consult for [place] Planning/Counseling Complete  Palliative Care Screening Not Applicable

## 2022-11-03 NOTE — Progress Notes (Signed)
Occupational Therapy Treatment Patient Details Name: Anne Phillips MRN: [id number] DOB: 1950/12/09 Today's Date: 11/03/2022   History of present illness Anne Phillips is a 72 y.o. female with medical history significant of seizure disorder, hypertension, hyperlipidemia, CAD, breast cancer s/p mastectomy, hypothyroidism, who presents to the ED with complaints of altered mental status and following fall at her STR. Found to have Comminuted, displaced and overriding fracture of the distal 3rd of the right clavicular shaft   OT comments  Anne Phillips is making progress toward her self care goals.  She was initially agreeable to OT treatment.  Patient was received in bed, not wearing sling.  OT provided education re: RUE NWB restrictions, and importance of immobilization to promote healing.  Patient minimally receptive, but was agreeable to donning sling with education re: donning and doffing.  OT provided education re: compensatory strategies for RUB NWB status.  OT provided supervision assist for supine <> sit transfer.  Patient noted to have good sitting balance while seated EOB.  Session cut short due to MD informing patient during session that she will not be discharging today.  Patient was unwilling to continue further OT treatment and returned to bedlevel position while wearing sling.  Anne Phillips will continue to benefit from skilled OT services in acute setting to support safety and independence in ADLs given new weightbearing restrictions.  Discharge recommendations remain appropriate.   Recommendations for follow up therapy are one component of a multi-disciplinary discharge planning process, led by the attending physician.  Recommendations may be updated based on patient status, additional functional criteria and insurance authorization.    Follow Up Recommendations  Skilled nursing-short term rehab (<3 hours/day)     Assistance Recommended at Discharge Intermittent  Supervision/Assistance  Patient can return home with the following  Assist for transportation;A little help with bathing/dressing/bathroom;A little help with walking and/or transfers;Assistance with cooking/housework;Help with stairs or ramp for entrance   Equipment Recommendations   (defer to next level of care)    Recommendations for Other Services      Precautions / Restrictions Precautions Precautions: Fall Restrictions Weight Bearing Restrictions: Yes RUE Weight Bearing: Non weight bearing       Mobility Bed Mobility Overal bed mobility: Needs Assistance Bed Mobility: Supine to Sit, Sit to Supine     Supine to sit: Supervision Sit to supine: Supervision   General bed mobility comments: consistent cuing for NWB R UE with transitional movements; limited/no carry-over between trials Patient Response: Cooperative  Transfers                         Balance Overall balance assessment: Needs assistance Sitting-balance support: No upper extremity supported, Feet supported Sitting balance-Leahy Scale: Good Sitting balance - Comments: steady sitting reaching within BOS                                   ADL either performed or assessed with clinical judgement   ADL Overall ADL's : Needs assistance/impaired Eating/Feeding: Set up Eating/Feeding Details (indicate cue type and reason): needs cueing to refrain from using RUE Grooming: Min guard;Wash/dry hands;Standing Grooming Details (indicate cue type and reason): needs cueing to refrain from using RUE                                    Extremity/Trunk  Assessment Upper Extremity Assessment Upper Extremity Assessment: RUE deficits/detail RUE Deficits / Details: PROM WFL, AROM shoulder flexion limited RUE: Unable to fully assess due to immobilization   Lower Extremity Assessment Lower Extremity Assessment:  (grossly at least 4/5 throughout; no focal weakness appreciated)    Cervical / Trunk Assessment Cervical / Trunk Assessment: Kyphotic    Vision Baseline Vision/History: 1 Wears glasses Patient Visual Report: No change from baseline     Perception     Praxis      Cognition Arousal/Alertness: Awake/alert Behavior During Therapy: WFL for tasks assessed/performed                                   General Comments: Generally oriented to basic information; follows simple commands; limited insight/safety awareness        Exercises Other Exercises Other Exercises: provided education re: weightbearing restrictions, use of sling.  Supervision assist for bed mobility.    Shoulder Instructions       General Comments      Pertinent Vitals/ Pain       Pain Assessment Pain Assessment: No/denies pain  Home Living                                          Prior Functioning/Environment              Frequency  Min 2X/week        Progress Toward Goals  OT Goals(current goals can now be found in the care plan section)  Progress towards OT goals: Progressing toward goals  Acute Rehab OT Goals Patient Stated Goal: to go home OT Goal Formulation: With patient Time For Goal Achievement: 11/13/22 Potential to Achieve Goals: Good  Plan Discharge plan remains appropriate;Frequency remains appropriate    Co-evaluation                 AM-PAC OT "6 Clicks" Daily Activity     Outcome Measure   Help from another person eating meals?: None Help from another person taking care of personal grooming?: A Little Help from another person toileting, which includes using toliet, bedpan, or urinal?: A Little Help from another person bathing (including washing, rinsing, drying)?: A Little Help from another person to put on and taking off regular upper body clothing?: A Little Help from another person to put on and taking off regular lower body clothing?: A Little 6 Click Score: 19    End of Session    OT  Visit Diagnosis: Muscle weakness (generalized) (M62.81);History of falling (Z91.81)   Activity Tolerance Patient tolerated treatment well   Patient Left in bed;with bed alarm set;with call bell/phone within reach   Nurse Communication          Time: [id number] OT Time Calculation (min): 12 min  Charges: OT General Charges $OT Visit: 1 Visit OT Treatments $Therapeutic Activity: 8-22 mins  Jeneen Montgomery, OTR/L 11/03/22, 10:26 AM

## 2022-11-04 DIAGNOSIS — E871 Hypo-osmolality and hyponatremia: Secondary | ICD-10-CM | POA: Diagnosis not present

## 2022-11-04 LAB — BASIC METABOLIC PANEL
Anion gap: 7 (ref 5–15)
BUN: 10 mg/dL (ref 8–23)
CO2: 20 mmol/L — ABNORMAL LOW (ref 22–32)
Calcium: 8.3 mg/dL — ABNORMAL LOW (ref 8.9–10.3)
Chloride: 106 mmol/L (ref 98–111)
Creatinine, Ser: 0.98 mg/dL (ref 0.44–1.00)
GFR, Estimated: >60 mL/min (ref >60)
Glucose, Bld: 95 mg/dL (ref 70–99)
Potassium: 3.7 mmol/L (ref 3.5–5.1)
Sodium: 129 mmol/L — ABNORMAL LOW (ref 135–145)

## 2022-11-04 LAB — T4, FREE: Free T4: 1.1 ng/dL (ref 0.61–1.12)

## 2022-11-04 LAB — CBC
HCT: 22.6 % — ABNORMAL LOW (ref 36.0–46.0)
Hemoglobin: 7.3 g/dL — ABNORMAL LOW (ref 12.0–15.0)
MCH: 28.6 pg (ref 26.0–34.0)
MCHC: 32.3 g/dL (ref 30.0–36.0)
MCV: 88.6 fL (ref 80.0–100.0)
Platelets: 204 10*3/uL (ref 150–400)
RBC: 2.55 MIL/uL — ABNORMAL LOW (ref 3.87–5.11)
RDW: 14.6 % (ref 11.5–15.5)
WBC: 6.7 10*3/uL (ref 4.0–10.5)
nRBC: 0 % (ref 0.0–0.2)

## 2022-11-04 LAB — MAGNESIUM: Magnesium: 2.5 mg/dL — ABNORMAL HIGH (ref 1.7–2.4)

## 2022-11-04 LAB — PHOSPHORUS: Phosphorus: 2.8 mg/dL (ref 2.5–4.6)

## 2022-11-04 MED ORDER — LORAZEPAM 2 MG/ML IJ SOLN: 2.0000 mg | INTRAMUSCULAR | Status: DC | PRN

## 2022-11-04 MED ORDER — HYDRALAZINE HCL 20 MG/ML IJ SOLN
10.0000 mg | Freq: Four times a day (QID) | INTRAMUSCULAR | Status: DC | PRN
  Administered 2022-11-04: 10 mg via INTRAVENOUS
  Filled 2022-11-04: qty 1

## 2022-11-04 MED ORDER — ENSURE ENLIVE PO LIQD: 237.0000 mL | Freq: Three times a day (TID) | ORAL | Status: DC

## 2022-11-04 MED ORDER — CARVEDILOL 3.125 MG PO TABS
6.2500 mg | ORAL_TABLET | Freq: Once | ORAL | Status: AC
  Administered 2022-11-04: 6.25 mg via ORAL
  Filled 2022-11-04: qty 2

## 2022-11-04 MED ORDER — HYDRALAZINE HCL 50 MG PO TABS: 50.0000 mg | ORAL_TABLET | Freq: Four times a day (QID) | ORAL | Status: DC | PRN

## 2022-11-04 MED ORDER — CARVEDILOL 12.5 MG PO TABS
12.5000 mg | ORAL_TABLET | Freq: Two times a day (BID) | ORAL | Status: DC
  Administered 2022-11-04 – 2022-11-06 (×4): 12.5 mg via ORAL
  Filled 2022-11-04 (×4): qty 1

## 2022-11-04 NOTE — Plan of Care (Signed)

## 2022-11-04 NOTE — Progress Notes (Signed)
Physical Therapy Treatment Patient Details Name: Anne Phillips MRN: [id number] DOB: 01/18/1951 Today's Date: 11/04/2022   History of Present Illness Anne Phillips is a 72 y.o. female with medical history significant of seizure disorder, hypertension, hyperlipidemia, CAD, breast cancer s/p mastectomy, hypothyroidism, who presents to the ED with complaints of altered mental status and following fall at her STR. Found to have Comminuted, displaced and overriding fracture of the distal 3rd of the right clavicular shaft    PT Comments    Patient requiring mod encouragement for participation with session today. Once agreeable, completes gait distance (L UE pushing IV pole) with min assist; demonstrates reciprocal stepping pattern, but globally unsteady; moderate sway with head turns, changes of direction requiring hands-on assist to prevent LOB throughout gait distance. Fatigues quickly and requires seated rest period to complete distance today (vitals stable and WFL on RA)     Recommendations for follow up therapy are one component of a multi-disciplinary discharge planning process, led by the attending physician.  Recommendations may be updated based on patient status, additional functional criteria and insurance authorization.  Follow Up Recommendations  Skilled nursing-short term rehab (<3 hours/day) Can patient physically be transported by private vehicle: Yes   Assistance Recommended at Discharge Frequent or constant Supervision/Assistance  Patient can return home with the following A little help with walking and/or transfers;A little help with bathing/dressing/bathroom;Assistance with cooking/housework;Assist for transportation;Help with stairs or ramp for entrance   Equipment Recommendations       Recommendations for Other Services       Precautions / Restrictions Precautions Precautions: Fall Restrictions Weight Bearing Restrictions: Yes RUE Weight Bearing: Non weight bearing      Mobility  Bed Mobility Overal bed mobility: Needs Assistance Bed Mobility: Supine to Sit, Sit to Supine     Supine to sit: Supervision Sit to supine: Supervision        Transfers Overall transfer level: Needs assistance   Transfers: Sit to/from Stand Sit to Stand: Min guard, Min assist                Ambulation/Gait Ambulation/Gait assistance: Min assist Gait Distance (Feet):  (120' x1, 80' x1) Assistive device: IV Pole         General Gait Details: reciprocal stepping pattern, but globally unsteady; moderate sway with head turns, changes of direction requiring hands-on assist to prevent LOB throughout gait distance.  Fatigues quickly and requires seated rest period to complete distance today (vitals stable and WFL on RA)   Stairs             Wheelchair Mobility    Modified Rankin (Stroke Patients Only)       Balance Overall balance assessment: Needs assistance Sitting-balance support: No upper extremity supported, Feet supported Sitting balance-Leahy Scale: Good     Standing balance support: Single extremity supported Standing balance-Leahy Scale: Poor                              Cognition Arousal/Alertness: Awake/alert Behavior During Therapy: WFL for tasks assessed/performed, Impulsive Overall Cognitive Status: Within Functional Limits for tasks assessed                                 General Comments: Generally oriented to basic information; follows simple commands; limited insight/safety awareness        Exercises      General Comments  Pertinent Vitals/Pain Pain Assessment Pain Assessment: No/denies pain    Home Living                          Prior Function            PT Goals (current goals can now be found in the care plan section) Acute Rehab PT Goals Patient Stated Goal: improve her mobility. PT Goal Formulation: With patient Time For Goal Achievement:  11/13/22 Potential to Achieve Goals: Fair Progress towards PT goals: Progressing toward goals    Frequency    Min 2X/week      PT Plan Current plan remains appropriate    Co-evaluation              AM-PAC PT "6 Clicks" Mobility   Outcome Measure  Help needed turning from your back to your side while in a flat bed without using bedrails?: None Help needed moving from lying on your back to sitting on the side of a flat bed without using bedrails?: None Help needed moving to and from a bed to a chair (including a wheelchair)?: A Little Help needed standing up from a chair using your arms (e.g., wheelchair or bedside chair)?: A Little Help needed to walk in hospital room?: A Little Help needed climbing 3-5 steps with a railing? : A Little 6 Click Score: 20    End of Session Equipment Utilized During Treatment: Gait belt Activity Tolerance: Patient limited by fatigue Patient left: in bed;with call bell/phone within reach;with bed alarm set   PT Visit Diagnosis: Difficulty in walking, not elsewhere classified (R26.2);Unsteadiness on feet (R26.81);Muscle weakness (generalized) (M62.81)     Time: [id number] PT Time Calculation (min) (ACUTE ONLY): 16 min  Charges:  $Gait Training: 8-22 mins                    [place] Masaki H. Owens Shark, PT, DPT, NCS 11/04/22, 2:36 PM [phone]

## 2022-11-04 NOTE — Progress Notes (Signed)
[facility] Progress Note  Patient: Anne Phillips    [id number]  DOA: 10/28/2022     Date of Service: the patient was seen and examined on 11/04/2022  Chief Complaint  Patient presents with   Altered Mental Status   Brief hospital course: Anne Phillips is a 72 y.o. female with medical history significant of seizure disorder, hypertension, hyperlipidemia, CAD, breast cancer s/p mastectomy, hypothyroidism, who presents to the ED with complaints of altered mental status.  Patient is very poor historian, she fell couple of days ago, does not remember the details, she had fracture of right clavicle.  Patient has remote history of seizures.  As per patient's sister patient has been confused recently. ED workup: Hyponatremia sodium 124, creatinine 1.23 slightly elevated, CK2 43 CT right shoulder: Comminuted, displaced and overriding fracture of the distal 3rd of the right clavicular shaft with moderate surrounding soft tissue swelling. 2. Deformity of the greater tuberosity may relate to an old fracture as correlated with previous radiographs. A mild superimposed acute impaction fracture is difficult to exclude. TRH contacted for admission for symptomatic hyponatremia.   Assessment and Plan:  # Hyponatremia, unknown cause could be SIADH versus hypovolemic hyponatremia Sodium 122, serum osmolality 261 low, currently, urine 70-91 S/p 500 mL NS bolus, Na 124 --128--126--131--130--125--129 Continue to monitor sodium level daily Nephrology consulted, no indication for 3% saline at this time 2/28 started sodium chloride tablets 1 g p.o. 3 times daily 3/1 increase sodium chloride 2 g p.o. 3 times daily  # Hypomagnesemia, mag repleted. # Hypophosphatemia due to nutritional deficiency.  Phos repleted. Monitor electrolytes and replete as needed.  # Acute metabolic encephalopathy could be secondary to hyponatremia Continue fall precautions and seizure precautions Continue supportive  care   # Seizure disorder Patient has history of generalized tonic-clonic seizures as well as focal seizures with left arm jerking. Continue Keppra and Vimpat Continue seizure precautions Case was discussed with neurology, recommended continue current treatment, if patient is having breakthrough episodes then we could give extra dose of Vimpat 100 mg and increase dose of Vimpat to 200 mg twice daily 3/4 Vimpat 100 mg IV x 1 dose given and increased Vimpat 20 mg p.o. twice daily.  Patient was complaining of shaking of arm  Right clavicle fracture and deformity of greater tuberosity Continue as needed medication for pain control Immobilized right shoulder and nonweightbearing Seen by orthopedic surgery, recommended nonsurgical management  CKD stage IIIb Currently renal which is at baseline, continue to monitor.  Hypertension, HFpEF, no signs of volume overload at this time Held Lasix for now Continued Coreg and losartan Monitor BP and titrate medications accordingly   Anemia of chronic disease Hb 10.0-8.2--7.8--7.7--7.3  continue to monitor Iron studies within normal range, folic acid 6.2 at lower end, B12 within normal range Started folic acid 1 mg p.o. daily Patient denies any GI bleeding, no dark stools.  We will check FOBT as needed  Vitamin D deficiency: started vitamin D 50,000 units p.o. weekly, follow with PCP to repeat vitamin D level after 3 to 6 months.   Hyperthyroidism, TSH 0.34 low and Free T4 level 1.37 elevated 2/29 started Methimazole 5 mg po BID x 3 days and 5 po daily  free T3 level 2.2 wnl FT4 level  1.37--1.10  fluctuating Thyroid antibodies negative US Thyroid: Multiple nodules in the bilateral lobes, either patient needs a biopsy versus follow-up ultrasound in 1 year.  Please review complete report for details. Patient stated that she has history  of overactive thyroid and she was on medication which she stopped in 2019.  Recommended to follow with  endocrinologist as an outpatient.  Diarrhea patient developed loose stools on 2/29 per RN C. difficile and GI pathogen was ordered but patient's diarrhea resolved so sample was not sent. Started probiotics 2/29 s/p NS 75 ml/hr for 12 hours 3/3 stool negative for C. difficile and GI pathogen Imodium as needed ordered   Body mass index is 22.48 kg/m.  Interventions:       Diet: Regular diet DVT Prophylaxis: Subcutaneous Lovenox   Advance goals of care discussion: Full code  Family Communication: family was present at bedside, at the time of interview.  The pt provided permission to discuss medical plan with the family. Opportunity was given to ask question and all questions were answered satisfactorily.   Disposition:  Pt is from SNF, admitted with hyponatremia and AMS, still has low sodium, which precludes a safe discharge. Discharge to SNF, when stable and cleared by nephrology..  Subjective: No significant overnight events, diarrhea resolved, no seizure activities.  Patient was resting only.  Patient has a decreased oral intake stated that she does not have dentures.  SLP eval consulted for diet recommendation.  Physical Exam: General: NAD, lying comfortably Appear in no distress, affect appropriate Eyes: PERRLA ENT: Oral Mucosa Clear, moist  Neck: no JVD,  Cardiovascular: S1 and S2 Present, no Murmur,  Respiratory: good respiratory effort, Bilateral Air entry equal and Decreased, no Crackles, no wheezes Abdomen: Bowel Sound present, Soft and no tenderness,  Skin: no rashes Extremities: no Pedal edema, no calf tenderness Neurologic: without any new focal findings Gait not checked due to patient safety concerns  Vitals:   11/04/22 0034 11/04/22 0036 11/04/22 0310 11/04/22 0844  BP: (!) 165/88 (!) 161/93 (!) 166/78 (!) 196/101  Pulse: 75 79 71 85  Resp: 16   18  Temp: 98.3 F (36.8 C)   98.1 F (36.7 C)  TempSrc:      SpO2: 100%   100%  Weight:      Height:         Intake/Output Summary (Last 24 hours) at 11/04/2022 1417 Last data filed at 11/04/2022 0400 Gross per 24 hour  Intake 1000 ml  Output --  Net 1000 ml    Filed Weights   10/28/22 1057  Weight: 54 kg    Data Reviewed: I have personally reviewed and interpreted daily labs, tele strips, imagings as discussed above. I reviewed all nursing notes, pharmacy notes, vitals, pertinent old records I have discussed plan of care as described above with RN and patient/family.  CBC: Recent Labs  Lab 10/29/22 0326 10/30/22 0630 10/31/22 0329 11/01/22 0651 11/02/22 0552 11/03/22 0509 11/04/22 0344  WBC 6.8   < > 6.0 6.1 6.3 5.5 6.7  NEUTROABS 4.9  --   --   --   --   --   --   HGB 8.2*   < > 7.8* 8.0* 7.7* 7.5* 7.3*  HCT 24.2*   < > 23.6* 24.2* 23.3* 23.2* 22.6*  MCV 84.0   < > 86.4 86.1 87.3 88.9 88.6  PLT 205   < > 211 210 223 200 204   < > = values in this interval not displayed.   Basic Metabolic Panel: Recent Labs  Lab 10/31/22 0329 11/01/22 0651 11/02/22 0552 11/03/22 0509 11/04/22 0344  NA 126* 131* 130* 125* 129*  K 3.5 3.5 3.6 3.7 3.7  CL 97* 100 101 97* 106  CO2 21* 22 22 21* 20*  GLUCOSE 99 96 97 109* 95  BUN '12 11 11 12 10  '$ CREATININE 1.13* 1.01* 0.98 0.97 0.98  CALCIUM 8.6* 8.0* 8.5* 7.7* 8.3*  MG 1.7 1.6* 1.9 1.6* 2.5*  PHOS 2.3* 4.8* 3.2 2.6 2.8    Studies: No results found.  Scheduled Meds:  aspirin EC  81 mg Oral Daily   atorvastatin  40 mg Oral Daily   carvedilol  12.5 mg Oral BID WC   enoxaparin (LOVENOX) injection  40 mg Subcutaneous [id number]   folic acid  1 mg Oral Daily   gabapentin  300 mg Oral BID   lacosamide  200 mg Oral BID   levETIRAcetam  750 mg Oral BID   losartan  100 mg Oral Daily   methimazole  5 mg Oral Daily   sodium chloride  2 g Oral TID WC   Vitamin D (Ergocalciferol)  50,000 Units Oral Q7 days   Continuous Infusions:    PRN Meds: acetaminophen **OR** acetaminophen, hydrALAZINE **OR** hydrALAZINE, loperamide, ondansetron  **OR** ondansetron (ZOFRAN) IV, oxyCODONE  Time spent: 35 minutes  Author: Val Riles. MD [facility] 11/04/2022 2:17 PM  To reach On-call, see care teams to locate the attending and reach out to them via [email or web address]. If 7PM-7AM, please contact night-coverage If you still have difficulty reaching the attending provider, please page the [facility] (Director on Call) for [facility] on amion for assistance.

## 2022-11-04 NOTE — Progress Notes (Signed)
[facility]  ROUNDING NOTE   Subjective:   [name] is a 72 y.o.  female with past medical history including hypertension, CAD, seizure disorder, breast cancer with mastectomy, and hypothyroidism.  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    Patient states she is having trouble eating due to not having her dentures.  No other complaints.   Sodium 129  Objective:  Vital signs in last 24 hours:  Temp:  [98.1 F (36.7 C)-98.3 F (36.8 C)] 98.1 F (36.7 C) (03/05 0844) Pulse Rate:  [71-85] 85 (03/05 0844) Resp:  [16-18] 18 (03/05 0844) BP: (146-196)/(70-101) 196/101 (03/05 0844) SpO2:  [99 %-100 %] 100 % (03/05 0844)  Weight change:  Filed Weights   10/28/22 1057  Weight: 54 kg    Intake/Output: I/O last 3 completed shifts: In: 1000 [I.V.:1000] Out: -    Intake/Output this shift:  No intake/output data recorded.  Physical Exam: General: NAD  Head: Normocephalic, atraumatic. Moist oral mucosal membranes  Eyes: Anicteric  Lungs:  Clear to auscultation, normal effort, room air  Heart: Regular rate and rhythm  Abdomen:  Soft, nontender, nondistended  Extremities: No peripheral edema.  Neurologic: Alert and oriented, moving all four extremities  Skin: No lesions  Access: None    Basic Metabolic Panel: Recent Labs  Lab 10/31/22 0329 11/01/22 0651 11/02/22 0552 11/03/22 0509 11/04/22 0344  NA 126* 131* 130* 125* 129*  K 3.5 3.5 3.6 3.7 3.7  CL 97* 100 101 97* 106  CO2 21* 22 22 21* 20*  GLUCOSE 99 96 97 109* 95  BUN '12 11 11 12 10  '$ CREATININE 1.13* 1.01* 0.98 0.97 0.98  CALCIUM 8.6* 8.0* 8.5* 7.7* 8.3*  MG 1.7 1.6* 1.9 1.6*  2.5*  PHOS 2.3* 4.8* 3.2 2.6 2.8     Liver Function Tests: No results for input(s): "AST", "ALT", "ALKPHOS", "BILITOT", "PROT", "ALBUMIN" in the last 168 hours.  No results for input(s): "LIPASE", "AMYLASE" in the last 168 hours. No results for input(s): "AMMONIA" in the last 168 hours.  CBC: Recent Labs  Lab 10/29/22 0326 10/30/22 0630 10/31/22 0329 11/01/22 0651 11/02/22 0552 11/03/22 0509 11/04/22 0344  WBC 6.8   < > 6.0 6.1 6.3 5.5 6.7  NEUTROABS 4.9  --   --   --   --   --   --   HGB 8.2*   < > 7.8* 8.0* 7.7* 7.5* 7.3*  HCT 24.2*   < > 23.6* 24.2* 23.3* 23.2* 22.6*  MCV 84.0   < > 86.4 86.1 87.3 88.9 88.6  PLT 205   < > 211 210 223 200 204   < > = values in this interval not displayed.     Cardiac Enzymes: Recent Labs  Lab 10/29/22 0917  CKTOTAL 243*     BNP: Invalid input(s): "POCBNP"  CBG: No results for input(s): "GLUCAP" in the last 168 hours.  Microbiology: Results for orders placed or performed during the hospital encounter of 10/28/22  MRSA Next Gen by PCR, Nasal     Status: None   Collection Time: 10/28/22  4:00 PM   Specimen: Nasal Mucosa; Nasal Swab  Result Value Ref Range Status   MRSA by PCR Next Gen NOT DETECTED NOT  DETECTED Final    Comment: (NOTE) The GeneXpert MRSA Assay (FDA approved for NASAL specimens only), is one component of a comprehensive MRSA colonization surveillance program. It is not intended to diagnose MRSA infection nor to guide or monitor treatment for MRSA infections. Test performance is not FDA approved in patients less than 29 years old. Performed at [facility], [place], [place] [address]   C Difficile Quick Screen w PCR reflex     Status: None   Collection Time: 11/02/22 10:30 AM   Specimen: STOOL  Result Value Ref Range Status   C Diff antigen NEGATIVE NEGATIVE Final   C Diff toxin NEGATIVE NEGATIVE Final   C Diff interpretation No C. difficile detected.  Final    Comment:  Performed at [facility], [address] [place], [place] [address]  Gastrointestinal Panel by PCR , Stool     Status: None   Collection Time: 11/02/22 10:30 AM   Specimen: Stool  Result Value Ref Range Status   Campylobacter species NOT DETECTED NOT DETECTED Final   Plesimonas shigelloides NOT DETECTED NOT DETECTED Final   Salmonella species NOT DETECTED NOT DETECTED Final   Yersinia enterocolitica NOT DETECTED NOT DETECTED Final   Vibrio species NOT DETECTED NOT DETECTED Final   Vibrio cholerae NOT DETECTED NOT DETECTED Final   Enteroaggregative E coli (EAEC) NOT DETECTED NOT DETECTED Final   Enteropathogenic E coli (EPEC) NOT DETECTED NOT DETECTED Final   Enterotoxigenic E coli (ETEC) NOT DETECTED NOT DETECTED Final   Shiga like toxin producing E coli (STEC) NOT DETECTED NOT DETECTED Final   Shigella/Enteroinvasive E coli (EIEC) NOT DETECTED NOT DETECTED Final   Cryptosporidium NOT DETECTED NOT DETECTED Final   Cyclospora cayetanensis NOT DETECTED NOT DETECTED Final   Entamoeba histolytica NOT DETECTED NOT DETECTED Final   Giardia lamblia NOT DETECTED NOT DETECTED Final   Adenovirus F40/41 NOT DETECTED NOT DETECTED Final   Astrovirus NOT DETECTED NOT DETECTED Final   Norovirus GI/GII NOT DETECTED NOT DETECTED Final   Rotavirus A NOT DETECTED NOT DETECTED Final   Sapovirus (I, II, IV, and V) NOT DETECTED NOT DETECTED Final    Comment: Performed at [facility], [address] [place], [place] [address]    Coagulation Studies: No results for input(s): "LABPROT", "INR" in the last 72 hours.  Urinalysis: No results for input(s): "COLORURINE", "LABSPEC", "PHURINE", "GLUCOSEU", "HGBUR", "BILIRUBINUR", "KETONESUR", "PROTEINUR", "UROBILINOGEN", "NITRITE", "LEUKOCYTESUR" in the last 72 hours.  Invalid input(s): "APPERANCEUR"     Imaging: No results found.   Medications:      aspirin EC  81 mg Oral Daily   atorvastatin  40 mg Oral Daily   carvedilol   12.5 mg Oral BID WC   enoxaparin (LOVENOX) injection  40 mg Subcutaneous [id number]   folic acid  1 mg Oral Daily   gabapentin  300 mg Oral BID   lacosamide  200 mg Oral BID   levETIRAcetam  750 mg Oral BID   losartan  100 mg Oral Daily   methimazole  5 mg Oral Daily   sodium chloride  2 g Oral TID WC   Vitamin D (Ergocalciferol)  50,000 Units Oral Q7 days   acetaminophen **OR** acetaminophen, hydrALAZINE **OR** hydrALAZINE, loperamide, ondansetron **OR** ondansetron (ZOFRAN) IV, oxyCODONE  Assessment/ Plan:  [name] is a 72 y.o.  female with past medical history including hypertension, CAD, seizure disorder, breast cancer with mastectomy, and hypothyroidism.  Patient presents to the emergency department  with altered mental status and has been admitted for Hyponatremia [E87.1] Thyroid goiter [E04.9] Fall, initial encounter [W19.XXXA] Closed displaced fracture of acromial end of right clavicle, initial encounter [S42.031A]   Hyponatremia likely secondary to suspected poor oral intake.  Will perform medication review to evaluate other contributing factors.  Sodium on admission 122.   Sodium 129 today with IVF Will hold IVF and patient encouraged to increase oral intake. Primary team has ordered swallow evaluation.  Continue salt tabs 2 g 3 times daily. Due to fluctuating oral intake, patient may experience hyponatremia frequently outpatient.  2.  Chronic kidney disease stage IIIa.  Patient currently at baseline renal function.  Not currently followed by outpatient nephrology.    3. Anemia of chronic kidney disease Lab Results  Component Value Date   HGB 7.3 (L) 11/04/2022    Hemoglobin slowly decreasing, will defer management to primary team.    LOS: 6   3/5/202412:44 PM

## 2022-11-04 NOTE — Evaluation (Addendum)
Clinical/Bedside Swallow Evaluation Patient Details  Name: Anne Phillips MRN: [id number] Date of Birth: 12-22-1950  Today's Date: 11/04/2022 Time: SLP Start Time (ACUTE ONLY): 1145 SLP Stop Time (ACUTE ONLY): 1235 SLP Time Calculation (min) (ACUTE ONLY): 50 min  Past Medical History:  Past Medical History:  Diagnosis Date   Cancer ([place])    Coronary artery disease    Hyperlipidemia    Hypertension    Seizures ([place])    Slurred speech 10/11/2022   Thyroid disease    Past Surgical History:  Past Surgical History:  Procedure Laterality Date   MASTECTOMY Left    HPI:  Pt is a 72 y.o. female with medical history significant of seizure disorder, hypertension, hyperlipidemia, CAD, breast cancer s/p mastectomy, hypothyroidism, who presents to the ED with complaints of altered mental status.   Patient is very poor historian, she fell couple of days ago, does not remember the details, she had fracture of right clavicle.  Patient has remote history of seizures.  As per patient's sister patient has been confused recently.  At admit, it was noted that pt had hyponatremia and metabolic encephalopathy.   Recent MRI last admit revealed "Mild age-related cerebral atrophy with chronic small vessel  ischemic disease, with a few small remote lacunar infarcts about the  thalami.".  CXR this admit: Left basilar atelectasis on the PA radiograph, not well appreciated  on the lateral view radiograph. No appreciable airspace  consolidation on the right.    Assessment / Plan / Recommendation  Clinical Impression   Pt seen for BSE today. Pt awake, verbal but min distracted easily. Unsure of pt's full Cognitive baseline prior to admit. Partially upright in bed feeding self lunch meal of spaghetti(modified to mech soft for this session/eval). Talkative and followed basic instructions. Edentulous. On RA; afebrile. WBC wnl.    Pt appears to present w/ functional oropharyngeal phase swallowing in setting of Edentulous  status and min easy distraction at times. ANY Cognitive decline can impact overall awareness/timing of swallow, bolus awareness/mastication during oral phase, and safety during po tasks which increases risk for aspiration, choking. No neuromuscular swallowing deficits appreciated. No overt clinical s/s of aspiration noted during oral intake of trials. Pt appears at reduced risk for aspiration from an oropharyngeal phase standpoint following general aspiration precautions, using a modified diet consistency of cut/broken down foods for ease of mastication, and when given support w/ tray setup and positioning Upright for safe eating/drinking w/ meals. She required min+ verbal/visual cues for follow through during po tasks and self-feeding this session today.        Pt consumed several trials of purees, cut/chopped solids and thin liquids via cup/straw w/ No overt clinical s/s of aspiration noted: no decline in vocal quality, no cough, and no decline in respiratory status during/post trials. O2 sats remained in upper 90s. Oral phase was adequate for bolus management and oral clearing of the liquid and puree boluses given. Mashing/Gumming of cut/chopped, softened solids was min increased in Time/effort but grossly functional given Time and using small pieces/moistened well. Encouraged pt to use lingual sweeping b/t bites and to alternate foods/liquids. Pt was able to self-feed w/ setup which improves safety of swallowing.  OM Exam appeared [facility] w/ No unilateral weakness noted. No spillage/leakage noted unilaterally. Lingual sweeping/clearing complete.      In setting of Edentulous status at Baseline, recommend initiation of the Dysphagia level 3(well-cut/minced meats and/or foods moistened for ease of oral phase/mastication) w/ thin liquids; general aspiration precautions; reduce  Distractions during meals and support w/ tray setup and positioning for meals. Pills Crushed in Puree for safer swallowing as needed.  MD/NSG updated.  Recommend Dietician f/u for support w/ food preferences/consistencies. Precautions posted in room. MD to reconsult ST services if new needs arise during admit. NSG updated.  SLP Visit Diagnosis: Dysphagia, unspecified (R13.10) (Edentulous at Baseline)    Aspiration Risk  Mild aspiration risk;Risk for inadequate nutrition/hydration (reduced following general precs w/ Cut/chopped foods(easy for gumming))    Diet Recommendation   Dysphagia level 3(well-cut/minced meats and/or foods moistened for ease of oral phase/mastication) w/ thin liquids; general aspiration precautions; reduce Distractions during meals and support w/ tray setup and positioning for meals. Dietician f/u.  Medication Administration: Whole meds with puree (vs 1-2 at a time w/ water)    Other  Recommendations Recommended Consults:  (Dietician f/u) Oral Care Recommendations: Oral care BID;Oral care before and after PO;Patient independent with oral care (setup)    Recommendations for follow up therapy are one component of a multi-disciplinary discharge planning process, led by the attending physician.  Recommendations may be updated based on patient status, additional functional criteria and insurance authorization.  Follow up Recommendations No SLP follow up      Assistance Recommended at Discharge  Intermittent for meals  Functional Status Assessment Patient has had a recent decline in their functional status and demonstrates the ability to make significant improvements in function in a reasonable and predictable amount of time. (suspect impact of different foods/consistencies while hospitalized)  Frequency and Duration  (n/a)   (n/a)       Prognosis Prognosis for improved oropharyngeal function: Fair (-Good) Barriers to Reach Goals: Cognitive deficits;Time post onset;Severity of deficits;Behavior Barriers/Prognosis Comment: Edentulous at Baseline      Swallow Study   General Date of Onset:  10/28/22 HPI: Pt is a 72 y.o. female with medical history significant of seizure disorder, hypertension, hyperlipidemia, CAD, breast cancer s/p mastectomy, hypothyroidism, who presents to the ED with complaints of altered mental status.   Patient is very poor historian, she fell couple of days ago, does not remember the details, she had fracture of right clavicle.  Patient has remote history of seizures.  As per patient's sister patient has been confused recently.  At admit, it was noted that pt had hyponatremia and metabolic encephalopathy.   Recent MRI last admit revealed "Mild age-related cerebral atrophy with chronic small vessel  ischemic disease, with a few small remote lacunar infarcts about the  thalami.".  CXR this admit: Left basilar atelectasis on the PA radiograph, not well appreciated  on the lateral view radiograph. No appreciable airspace  consolidation on the right. Type of Study: Bedside Swallow Evaluation Previous Swallow Assessment: none Diet Prior to this Study: Regular;Thin liquids (Level 0) (modified to a level 3 diet for this meal) Temperature Spikes Noted: No (wbc 6.7) Respiratory Status: Room air History of Recent Intubation: No Behavior/Cognition: Alert;Cooperative;Pleasant mood;Distractible;Requires cueing Oral Cavity Assessment: Within Functional Limits Oral Care Completed by SLP: Recent completion by staff Oral Cavity - Dentition: Edentulous Vision: Functional for self-feeding Self-Feeding Abilities: Able to feed self;Needs assist;Needs set up Patient Positioning: Postural control adequate for testing Baseline Vocal Quality: Normal Volitional Cough: Strong Volitional Swallow: Able to elicit    Oral/Motor/Sensory Function Overall Oral Motor/Sensory Function: Within functional limits   Ice Chips Ice chips: Not tested   Thin Liquid Thin Liquid: Within functional limits Presentation: Self Fed;Straw (~4 ozs total)    Nectar Thick Nectar Thick Liquid: Not tested  Honey Thick Honey Thick Liquid: Not tested   Puree Puree: Within functional limits Presentation: Self Fed;Spoon (5 trials)   Solid     Solid: Impaired (edentulous) Presentation: Self Fed;Spoon (5 trials) Oral Phase Impairments: Impaired mastication (edentulous) Oral Phase Functional Implications: Impaired mastication Pharyngeal Phase Impairments:  (none) Other Comments: stated the chopped spaghetti was "easy" to eat          Orinda Kenner, MS, [place]; [place] [phone] (ascom) Zakiyyah Savannah 11/04/2022,3:18 PM

## 2022-11-04 NOTE — TOC Progression Note (Signed)
Transition of Care [facility]) - Progression Note    Patient Details  Name: Anne Phillips MRN: [id number] Date of Birth: 02/11/1951  Transition of Care [facility]) CM/SW [place], RN Phone Number: 11/04/2022, 9:56 AM  Clinical Narrative:    TOC continues to follow the patient for DC Planning and needs Ins approval will expire at midnight tonight Will get extension when she is medically cleared to DC to [facility]         Expected Discharge Plan and Services                                               Social Determinants of Health (SDOH) Interventions SDOH Screenings   Food Insecurity: No Food Insecurity (10/28/2022)  Housing: Low Risk  (10/28/2022)  Transportation Needs: No Transportation Needs (10/28/2022)  Utilities: Not At Risk (10/28/2022)  Tobacco Use: High Risk (10/28/2022)    Readmission Risk Interventions    10/12/2022   11:51 AM  Readmission Risk Prevention Plan  PCP or Specialist Appt within 3-5 Days Complete  Social Work Consult for [place] Planning/Counseling [place] Not Applicable

## 2022-11-05 ENCOUNTER — Inpatient Hospital Stay: Payer: Medicare HMO

## 2022-11-05 DIAGNOSIS — E871 Hypo-osmolality and hyponatremia: Secondary | ICD-10-CM | POA: Diagnosis not present

## 2022-11-05 DIAGNOSIS — R4182 Altered mental status, unspecified: Secondary | ICD-10-CM | POA: Diagnosis not present

## 2022-11-05 LAB — CBC
HCT: 21.4 % — ABNORMAL LOW (ref 36.0–46.0)
Hemoglobin: 6.8 g/dL — ABNORMAL LOW (ref 12.0–15.0)
MCH: 28.3 pg (ref 26.0–34.0)
MCHC: 31.8 g/dL (ref 30.0–36.0)
MCV: 89.2 fL (ref 80.0–100.0)
Platelets: 201 10*3/uL (ref 150–400)
RBC: 2.4 MIL/uL — ABNORMAL LOW (ref 3.87–5.11)
RDW: 14.9 % (ref 11.5–15.5)
WBC: 5.6 10*3/uL (ref 4.0–10.5)
nRBC: 0 % (ref 0.0–0.2)

## 2022-11-05 LAB — BASIC METABOLIC PANEL
Anion gap: 5 (ref 5–15)
BUN: 9 mg/dL (ref 8–23)
CO2: 20 mmol/L — ABNORMAL LOW (ref 22–32)
Calcium: 8.4 mg/dL — ABNORMAL LOW (ref 8.9–10.3)
Chloride: 108 mmol/L (ref 98–111)
Creatinine, Ser: 0.98 mg/dL (ref 0.44–1.00)
GFR, Estimated: >60 mL/min (ref >60)
Glucose, Bld: 93 mg/dL (ref 70–99)
Potassium: 4.2 mmol/L (ref 3.5–5.1)
Sodium: 133 mmol/L — ABNORMAL LOW (ref 135–145)

## 2022-11-05 LAB — PREPARE RBC (CROSSMATCH)

## 2022-11-05 LAB — T4, FREE: Free T4: 0.96 ng/dL (ref 0.61–1.12)

## 2022-11-05 LAB — ABO/RH: ABO/RH(D): O POS

## 2022-11-05 MED ORDER — ADULT MULTIVITAMIN W/MINERALS CH
1.0000 | ORAL_TABLET | Freq: Every day | ORAL | Status: DC
  Administered 2022-11-05 – 2022-11-06 (×2): 1 via ORAL
  Filled 2022-11-05 (×2): qty 1

## 2022-11-05 MED ORDER — AMLODIPINE BESYLATE 5 MG PO TABS: 5.0000 mg | ORAL_TABLET | Freq: Every day | ORAL | Status: DC

## 2022-11-05 MED ORDER — PANTOPRAZOLE SODIUM 40 MG IV SOLR
40.0000 mg | Freq: Two times a day (BID) | INTRAVENOUS | Status: DC
  Administered 2022-11-05 – 2022-11-06 (×3): 40 mg via INTRAVENOUS
  Filled 2022-11-05 (×3): qty 10

## 2022-11-05 MED ORDER — LACOSAMIDE 50 MG PO TABS
150.0000 mg | ORAL_TABLET | Freq: Two times a day (BID) | ORAL | Status: DC
  Administered 2022-11-05 – 2022-11-06 (×2): 150 mg via ORAL
  Filled 2022-11-05 (×2): qty 3

## 2022-11-05 MED ORDER — SODIUM CHLORIDE 0.9% IV SOLUTION: Freq: Once | INTRAVENOUS | Status: AC

## 2022-11-05 MED ORDER — CLONAZEPAM 0.25 MG PO TBDP
1.0000 mg | ORAL_TABLET | Freq: Two times a day (BID) | ORAL | Status: DC
  Administered 2022-11-05 – 2022-11-06 (×3): 1 mg via ORAL
  Filled 2022-11-05 (×3): qty 4

## 2022-11-05 MED ORDER — LEVETIRACETAM 250 MG PO TABS
250.0000 mg | ORAL_TABLET | Freq: Every day | ORAL | Status: DC
  Administered 2022-11-05: 250 mg via ORAL
  Filled 2022-11-05: qty 1

## 2022-11-05 MED ORDER — PROSOURCE PLUS PO LIQD
30.0000 mL | Freq: Two times a day (BID) | ORAL | Status: DC
  Administered 2022-11-06: 30 mL via ORAL
  Filled 2022-11-05 (×2): qty 30

## 2022-11-05 MED ORDER — LEVETIRACETAM 500 MG PO TABS
500.0000 mg | ORAL_TABLET | Freq: Three times a day (TID) | ORAL | Status: DC
  Administered 2022-11-05 – 2022-11-06 (×4): 500 mg via ORAL
  Filled 2022-11-05 (×4): qty 1

## 2022-11-05 MED ORDER — AMLODIPINE BESYLATE 5 MG PO TABS
5.0000 mg | ORAL_TABLET | Freq: Every evening | ORAL | Status: DC
  Administered 2022-11-05: 5 mg via ORAL
  Filled 2022-11-05: qty 1

## 2022-11-05 NOTE — Progress Notes (Addendum)
Initial Nutrition Assessment  DOCUMENTATION CODES:   Not applicable  INTERVENTION:   -D/c Ensure Enlive po BID, each supplement provides 350 kcal and 20 grams of protein.  -MVI with minerals daily -Magic cup TID with meals, each supplement provides 290 kcal and 9 grams of protein  -30 ml Prosource Plus BID, each supplement provides 100 kcals and 15 grams protein  NUTRITION DIAGNOSIS:   Inadequate oral intake related to poor appetite as evidenced by per patient/family report.  GOAL:   Patient will meet greater than or equal to 90% of their needs  MONITOR:   PO intake, Supplement acceptance, Diet advancement  REASON FOR ASSESSMENT:   Consult Assessment of nutrition requirement/status  ASSESSMENT:   Pt with medical history significant of seizure disorder, hypertension, hyperlipidemia, CAD, breast cancer s/p mastectomy, hypothyroidism, who presents to with complaints of altered mental status.  Pt admitted with hyponatremia.   3/5- s/p BSE- D3 diet 3/6- s/p EEG  Reviewed I/O's: +520 ml x 24 hours and +1.4 L since admission   Pt lying in bed at time of visit. Pt did not respond to voice or touch.   Case discussed with SLP. Pt is a very selective eater and SLP requesting assistance with food preferences. Pt is refusing Ensure supplements. Noted meal completions 100%. Pt on a D3 diet for ease of intake secondary to missing teeth.   Reviewed wt hx; pt has experienced a 5.8% wt loss over the past year, which is not significant for time frame.   Medications reviewed and include folic acid, keppra, and vitamin D.   Labs reviewed: Na: 133, CBGS: 96 (inpatient orders for glycemic control are none).    NUTRITION - FOCUSED PHYSICAL EXAM:  Flowsheet Row Most Recent Value  Orbital Region No depletion  Upper Arm Region No depletion  Thoracic and Lumbar Region No depletion  Buccal Region No depletion  Temple Region No depletion  Clavicle Bone Region No depletion  Clavicle and  Acromion Bone Region No depletion  Scapular Bone Region No depletion  Dorsal Hand No depletion  Patellar Region No depletion  Anterior Thigh Region No depletion  Posterior Calf Region No depletion  Edema (RD Assessment) Mild  Hair Reviewed  Eyes Reviewed  Mouth Reviewed  Skin Reviewed  Nails Reviewed       Diet Order:   Diet Order             DIET DYS 3 Room service appropriate? Yes with Assist; Fluid consistency: Thin; Fluid restriction: 1500 mL Fluid  Diet effective now                   EDUCATION NEEDS:   Not appropriate for education at this time  Skin:  Skin Assessment: Reviewed RN Assessment  Last BM:  11/03/22  Height:   Ht Readings from Last 1 Encounters:  10/28/22 '5\' 1"'$  (1.549 m)    Weight:   Wt Readings from Last 1 Encounters:  10/28/22 54 kg    Ideal Body Weight:  47.7 kg  BMI:  Body mass index is 22.48 kg/m.  Estimated Nutritional Needs:   Kcal:  1450-1650  Protein:  70-85 grams  Fluid:  > 1.4 L    Loistine Chance, RD, LDN, [place] Registered Dietitian II Certified Diabetes Care and Education Specialist Please refer to [facility] for RD and/or RD on-call/weekend/after hours pager
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
## 2022-11-05 NOTE — Progress Notes (Signed)
Occupational Therapy Treatment Patient Details Name: Anne Phillips MRN: [id number] DOB: September 13, 1950 Today's Date: 11/05/2022   History of present illness Anne Phillips is a 72 y.o. female with medical history significant of seizure disorder, hypertension, hyperlipidemia, CAD, breast cancer s/p mastectomy, hypothyroidism, who presents to the ED with complaints of altered mental status and following fall at her STR. Found to have Comminuted, displaced and overriding fracture of the distal 3rd of the right clavicular shaft   OT comments  Chart reviewed, pt greeted in room requiring encouragement for participation in OT tx session. Tx session targeted improving functional activity tolerance for improved ADL completion. Pt is limited on this date by poor safety awarenss and impulsivity. Pt appears to be non complaint with RUE precautions, attempting to WB on RUE despite sling being donned. Multi modal cueing required throughout for safety/adherence to precautions. Pt reports she feels she is having seizures with tremor like movement noted sitting on edge of bed one attempt, RN notified of pt behavior and complaints. Discharge recommendation remains appropriate. OT will continue to follow acutely.    Recommendations for follow up therapy are one component of a multi-disciplinary discharge planning process, led by the attending physician.  Recommendations may be updated based on patient status, additional functional criteria and insurance authorization.    Follow Up Recommendations  Skilled nursing-short term rehab (<3 hours/day)     Assistance Recommended at Discharge Frequent or constant Supervision/Assistance  Patient can return home with the following  Assist for transportation;A little help with bathing/dressing/bathroom;A little help with walking and/or transfers;Assistance with cooking/housework;Help with stairs or ramp for entrance;Direct supervision/assist for financial management;Direct  supervision/assist for medications management   Equipment Recommendations       Recommendations for Other Services      Precautions / Restrictions Precautions Precautions: Fall Restrictions Weight Bearing Restrictions: Yes RUE Weight Bearing: Non weight bearing       Mobility Bed Mobility Overal bed mobility: Modified Independent             General bed mobility comments: poor safety awareness, NWBing prcautions    Transfers Overall transfer level: Needs assistance Equipment used: None Transfers: Sit to/from Stand Sit to Stand: Min guard           General transfer comment: steps up the bed with CGA     Balance Overall balance assessment: Needs assistance, History of Falls Sitting-balance support: No upper extremity supported, Feet supported Sitting balance-Leahy Scale: Good     Standing balance support: No upper extremity supported, During functional activity, Reliant on assistive device for balance Standing balance-Leahy Scale: Poor                             ADL either performed or assessed with clinical judgement   ADL Overall ADL's : Needs assistance/impaired     Grooming: Wash/dry hands;Oral care;Set up;Min guard                   Toilet Transfer: Min guard   Toileting- Clothing Manipulation and Hygiene: Min guard         General ADL Comments: pt generally unsafe throughout, attempting to put weight on RUE despite sling, requiers max multi modal cueing for safety    Extremity/Trunk Assessment              Vision       Perception     Praxis      Cognition Arousal/Alertness: Awake/alert  Behavior During Therapy: Impulsive Overall Cognitive Status: No family/caregiver present to determine baseline cognitive functioning Area of Impairment: Orientation, Attention, Memory, Following commands, Safety/judgement, Awareness, Problem solving                 Orientation Level: Disoriented to, Situation,  Time Current Attention Level: Sustained Memory: Decreased recall of precautions, Decreased short-term memory Following Commands: Follows one step commands with increased time Safety/Judgement: Decreased awareness of safety, Decreased awareness of deficits Awareness: Intellectual Problem Solving: Slow processing, Decreased initiation, Difficulty sequencing, Requires verbal cues, Requires tactile cues          Exercises      Shoulder Instructions       General Comments      Pertinent Vitals/ Pain       Pain Assessment Pain Assessment: Faces Faces Pain Scale: Hurts a little bit Pain Location: R shoulder Pain Descriptors / Indicators: Discomfort, Sore Pain Intervention(s): Monitored during session  Home Living                                          Prior Functioning/Environment              Frequency  Min 2X/week        Progress Toward Goals  OT Goals(current goals can now be found in the care plan section)  Progress towards OT goals: Progressing toward goals     Plan Discharge plan remains appropriate;Frequency remains appropriate    Co-evaluation                 AM-PAC OT "6 Clicks" Daily Activity     Outcome Measure   Help from another person eating meals?: None Help from another person taking care of personal grooming?: A Little Help from another person toileting, which includes using toliet, bedpan, or urinal?: A Little Help from another person bathing (including washing, rinsing, drying)?: A Little Help from another person to put on and taking off regular upper body clothing?: A Little Help from another person to put on and taking off regular lower body clothing?: A Little 6 Click Score: 19    End of Session    OT Visit Diagnosis: Muscle weakness (generalized) (M62.81);History of falling (Z91.81)   Activity Tolerance Patient tolerated treatment well   Patient Left in bed;with bed alarm set;with call bell/phone  within reach   Nurse Communication Mobility status (impulsiveness/cognition)        Time: [id number] OT Time Calculation (min): 9 min  Charges: OT General Charges $OT Visit: 1 Visit OT Treatments $Self Care/Home Management : 8-22 mins  Shanon Payor, OTD OTR/L  11/05/22, 2:38 PM

## 2022-11-05 NOTE — Consult Note (Signed)
Neurology Consultation Reason for Consult: Seizure management Requesting Physician: Val Riles  CC: Altered mental status  History is obtained from: Patient and chart review  HPI: Anne Phillips is a 72 y.o. 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  She is able to recognize medications when they are specifically named, does endorse taking clonazepam once twice a day, gabapentin 300 twice a day, Vimpat 150 mg twice a day, does not recognize the name Keppra but then notes she does take 500 mg 3 times a day and is able to state that from one of the doses she takes 1-1/2 tablets.  When questioned about her oral intake and hyponatremia, she notes that her sister cooks for her and she eats pretty well except she feels that her sisters cooking is too salty and so sometimes she eats less in that setting.   She was most recently seen by inpatient neurology 10/10/2022 through 10/17/2022.  At the time she was admitted due to hyponatremia and concern for breakthrough seizure.  She had waxing and waning mental status consistent with delirium with potential underlying dementia component. Seizure medications at the time were noted to be 850 mg twice daily of Vimpat, 300 mg 3 times daily of Keppra which was consolidated to 750 mg twice daily (but please note on review of her outpatient neurology note  from 12/10/2021 her dosing was indicated to be 500 in the morning, 500 midday, and 750 at night).  Clonazepam and gabapentin were not listed as seizure medications but on review of her neuro outpatient neurology notes he lists these as part of her seizure regimen.  Discharge instructions indicated that she should stop clonazepam  On review of these notes her home regimen was  Vimpat 150 mg twice daily,  Keppra 3 times a day 500/500/750 mg, Gabapentin 300 mg twice daily which was noted to be the most helpful medication for her Clonazepam 1 mg twice daily --on PDMP review this was last filled on 09/02/2022 and prescribed by her epileptologist  Orthopedic surgery was consulted on 02/27/2023 for displaced right clavicular fracture and has plan for known surgical management due to good results with this approach as well as due to expect the difficulty following postoperative instructions if surgery were pursued.  They recommended use of an immobilizer ideally but nonweightbearing throughout the right upper extremity and hand with plan for follow-up x-ray in 3 to 4 weeks  Regarding her hyponatremia, nephrology is following and has suspected poor oral intake as the etiology with recurrent hyponatremia secondary to fluctuating oral intake  Seizure HPI per outpatient neurologist, last seen 12/10/2021  "Epilepsy HPI Seizure Type 1: GTC Seizure Description: between 2-4 am,  Aura: Ringing in ears, lights blinking, hears music, Left arm stiffness and starts shaking, left hand balls up and tied up, then convulsions, lasts 2-5 mins Associated Symptoms: tongue biting, urinary incontinence, usually tired after, confused. Wants to be covered up, can be agitated. Seizure  Frequency: 07/2016, prior to that 2009.  Seizure Type 2: Left arm movements (more common) started on 2008 Seizure Description: Left arm stiffness and jerking, then her body gets limp, she calls for help and has to sit down. Lasts few seconds, has  fallen on several occasions and has hit her face and had a black eye. No fall or LOC. Associated Symptoms: becomes weak Seizure Frequency: daily Last seizure: 12/2019  Birth/Developmental History: sister also has seizures. Denies trauma and meningitis prior to onset of new seizures.  vEEG/ EMU evaluation: 06/12/14  IMPRESSION:  vEEG EMU evaluation 12/02/16 Impression: This long term video-EEG monitoring over 4 days captures five focal seizures without EEG correlate. The interictal EEG showed transient, sharply contoured activity in the delta-theta range which lasts for 30 seconds to 8 minutes during drowsiness without clinical signs or symptoms. This is supportive of diagnosis of SREDA. 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 Of note, EEG does not show electrographic correlate for focal motor seizures. MRI of brain: 05/15/15 --Bilateral peritrigonal white matter lesions, which are nonspecific but can be seen in the setting of demyelinating disease. No evidence of active demyelination. --Please note, this study was performed for workup of demyelinating disease and incompletely characterizes the hippocampi. NM Brain Spect 12/02/16: No definite areas of uptake to suggest seizure focus.  ...  Previous Seizure Medications: PHT (has become toxic with doses > 300 mg), LEV, GBP, CBZ, CNZ, and PB.  CBZ and PB discontinued due to no improvement in seizure frequency, LTG (dizziness, gait instability - self discontinued)"  ROS: All other review of systems was negative except as noted in the HPI, within limits of her recent altered mental status  Past Medical History:  Diagnosis Date   Cancer ([place])    Coronary artery disease    Hyperlipidemia    Hypertension    Seizures ([place])    Slurred speech 10/11/2022   Thyroid disease    Past Surgical History:  Procedure Laterality Date   MASTECTOMY Left    Current  Outpatient Medications  Medication Instructions   acetaminophen (TYLENOL) 650 mg, Oral, Every 6 hours PRN   aspirin EC 81 mg, Oral, Daily, Swallow whole.   atorvastatin (LIPITOR) 40 mg, Oral, Daily   carvedilol (COREG) 6.25 mg, Oral, 2 times daily with meals   cyanocobalamin 1,000 mcg, Oral, Daily   empagliflozin (JARDIANCE) 10 mg, Oral, Daily   feeding supplement (ENSURE ENLIVE / ENSURE PLUS) LIQD 237 mLs, Oral, 2 times daily between meals   furosemide (LASIX) 20 mg, Oral, Daily   gabapentin (NEURONTIN) 300 mg, Oral, 2 times daily   Lacosamide (VIMPAT) 150 mg, Oral, 2 times daily   levETIRAcetam (KEPPRA) 750 mg, Oral, 2 times daily   losartan (COZAAR) 100 mg, Oral, Daily   Multiple Vitamin (MULTIVITAMIN WITH MINERALS) TABS tablet 1 tablet, Oral, Daily   nicotine (NICODERM CQ - DOSED IN MG/24 HOURS) 14 mg, Transdermal, Daily   nitroGLYCERIN (NITROSTAT) 0.4 MG SL tablet Sublingual   tacrolimus (PROTOPIC) 0.1 % ointment 1 Application, Topical, Daily PRN    Family History  Problem Relation Age of Onset   Hypertension Other    Social History:  reports that she has been smoking cigarettes. She has been smoking an average of 1 pack per day. She has never used smokeless tobacco. She reports that she does not drink alcohol and does not use drugs.   Exam: Current vital signs: BP (!) 164/72 (BP Location:  Right Leg)   Pulse 77   Temp 98.5 F (36.9 C)   Resp 16   Ht '5\' 1"'$  (1.549 m)   Wt 54 kg   SpO2 99%   BMI 22.48 kg/m  Vital signs in last 24 hours: Temp:  [97.8 F (36.6 C)-98.5 F (36.9 C)] 98.5 F (36.9 C) (03/06 0821) Pulse Rate:  [71-81] 77 (03/06 0821) Resp:  [15-16] 16 (03/06 0821) BP: (133-164)/(62-72) 164/72 (03/06 0821) SpO2:  [99 %-100 %] 99 % (03/06 [id number])   Physical Exam  Constitutional: Appears well-developed and well-nourished.  Psych: Affect appropriate to situation, pleasant and cooperative Eyes: No scleral injection HENT: No oropharyngeal obstruction.  MSK:  Right upper extremity in a sling due to her clavicular fracture Cardiovascular: Perfusing extremities well Respiratory: Effort normal, non-labored breathing GI: Soft.  No distension. There is no tenderness.  Skin: Warm dry and intact visible skin  Neuro: Mental Status: Patient is awake, alert, oriented to person, place, month, year, and situation. Patient is able to give a clear and coherent history. No signs of aphasia or neglect (both visual and tactile double sided stimulus tested) Cranial Nerves: II: Visual Fields are full. Pupils are equal, round, and reactive to light.   III,IV, VI: EOMI without ptosis or diploplia.  V: Facial sensation is symmetric to temperature VII: Facial movement is symmetric.  VIII: hearing is intact to voice X: Uvula elevates symmetrically XI: Shoulder shrug is symmetric. XII: tongue is midline without atrophy or fasciculations.  Motor/gait: Somewhat choreiform movement, constant.  Right upper extremity testing deferred due to nonweightbearing secondary to clavicular fracture.  She is using the left upper extremity freely to support herself with good strength, furniture surfing to ambulate.  Overall steady gait but she does clip the counter with her left side when returning to bed Sensory: Sensation is symmetric to light touch and temperature in the arms and legs. Deep Tendon Reflexes: 2+ and symmetric in the brachioradialis and patellae.  Cerebellar: FNF intact bilaterally  I have reviewed labs in epic and the results pertinent to this consultation are:  Basic Metabolic Panel: Recent Labs  Lab 10/31/22 0329 11/01/22 0651 11/02/22 0552 11/03/22 0509 11/04/22 0344 11/05/22 0529  NA 126* 131* 130* 125* 129* 133*  K 3.5 3.5 3.6 3.7 3.7 4.2  CL 97* 100 101 97* 106 108  CO2 21* 22 22 21* 20* 20*  GLUCOSE 99 96 97 109* 95 93  BUN '12 11 11 12 10 9  '$ CREATININE 1.13* 1.01* 0.98 0.97 0.98 0.98  CALCIUM 8.6* 8.0* 8.5* 7.7* 8.3* 8.4*  MG 1.7 1.6* 1.9  1.6* 2.5*  --   PHOS 2.3* 4.8* 3.2 2.6 2.8  --     CBC: Recent Labs  Lab 11/01/22 0651 11/02/22 0552 11/03/22 0509 11/04/22 0344 11/05/22 0529  WBC 6.1 6.3 5.5 6.7 5.6  HGB 8.0* 7.7* 7.5* 7.3* 6.8*  HCT 24.2* 23.3* 23.2* 22.6* 21.4*  MCV 86.1 87.3 88.9 88.6 89.2  PLT 210 223 200 204 201    Coagulation Studies: No results for input(s): "LABPROT", "INR" in the last 72 hours.   Lab Results  Component Value Date   TSH 0.340 (L) 10/29/2022     I have reviewed the images obtained:  Head CT personally reviewed, agree with radiology no acute intracranial process 1. No evidence of acute intracranial or cervical spine injury. 2. Scalp hematoma without calvarial fracture. 3. Acute right clavicle fracture. 4. Multiple multinodular goiter, reference thyroid ultrasound with recommendation 08/03/2021.  Impression: Breakthrough seizures in the setting of incorrectly understood antiseizure regimen as well as prior symptomatic hyponatremia.  Appreciate management of hyponatremia per primary team/nephrology.  While she is on a somewhat atypical dosing regimen of Keppra, given that she requires quadruple therapy for reasonable seizure control, given that she has been stable on this regimen in the past, it should be resumed.  Her clonazepam should also be resumed which was inadvertently discontinued last admission.  With these changes I do not think she needs an increased dose of Vimpat and will change it back to her prior dose of 150 twice daily.  Recommendations: -Revert to Vimpat 150 mg twice daily -Revert Keppra to 500/500/750 mg -Resume clonazepam 1 mg twice daily and note that this should be continued long-term as part of her antiseizure regimen -Continue gabapentin 300 mg twice daily -Recommend confirmation with her family that there are no concerns for missed seizure medications at home and ideally medication adherence should be closely monitored -Consider outpatient neurocognitive  evaluation -Inpatient neurology will follow-up EEG result pending, but will otherwise be available as needed -Thank you for involving me in the care of this patient, recommendations conveyed to primary team via secure chat  Lesleigh Noe MD-PhD [facility] [phone] [facility] coverage for [facility] is from 8 AM to 4 AM in-house and 4 PM to 8 PM by telephone/video. 8 PM to 8 AM emergent questions or overnight urgent questions should be addressed to Teleneurology On-call or [facility] neurohospitalist; contact information can be found on AMION

## 2022-11-05 NOTE — Procedures (Signed)
Patient Name: ADILEE CARDINAL  MRN: [id number]  Epilepsy Attending: Lora Havens  Referring Physician/Provider: Val Riles, MD  Date: 11/05/2022 Duration: 20 mins  Patient history: 72yo F with h/o seizures, now with ams. EEG to evaluate for seizure  Level of alertness: Awake  AEDs during EEG study: LEV, LCM, GBP  Technical aspects: This EEG study was done with scalp electrodes positioned according to the 10-20 International system of electrode placement. Electrical activity was reviewed with band pass filter of 1-'70Hz'$ , sensitivity of 7 uV/mm, display speed of 73m/sec with a '60Hz'$  notched filter applied as appropriate. EEG data were recorded continuously and digitally stored.  Video monitoring was available and reviewed as appropriate.  Description: The posterior dominant rhythm consists of 8 Hz activity of moderate voltage (25-35 uV) seen predominantly in posterior head regions, symmetric and reactive to eye opening and eye closing. Hyperventilation and photic stimulation were not performed.     IMPRESSION: This study is within normal limits. No seizures or epileptiform discharges were seen throughout the recording.  A normal interictal EEG does not exclude the diagnosis of epilepsy.  Anyra Kaufman OBarbra Sarks

## 2022-11-05 NOTE — Progress Notes (Signed)
[facility] Progress Note  Patient: Anne Phillips    [id number]  DOA: 10/28/2022     Date of Service: the patient was seen and examined on 11/05/2022  Chief Complaint  Patient presents with   Altered Mental Status   Brief hospital course: Anne Phillips is a 72 y.o. female with medical history significant of seizure disorder, hypertension, hyperlipidemia, CAD, breast cancer s/p mastectomy, hypothyroidism, who presents to the ED with complaints of altered mental status.  Patient is very poor historian, she fell couple of days ago, does not remember the details, she had fracture of right clavicle.  Patient has remote history of seizures.  As per patient's sister patient has been confused recently. ED workup: Hyponatremia sodium 124, creatinine 1.23 slightly elevated, CK2 43 CT right shoulder: Comminuted, displaced and overriding fracture of the distal 3rd of the right clavicular shaft with moderate surrounding soft tissue swelling. 2. Deformity of the greater tuberosity may relate to an old fracture as correlated with previous radiographs. A mild superimposed acute impaction fracture is difficult to exclude. [facility] contacted for admission for symptomatic hyponatremia.   Assessment and Plan:  # Hyponatremia, unknown cause could be SIADH versus hypovolemic hyponatremia Sodium 122, serum osmolality 261 low, currently, urine 70-91 S/p 500 mL NS bolus, Na 124 --128--126--131--130--125--129 Continue to monitor sodium level daily Nephrology consulted, no indication for 3% saline at this time 2/28 started sodium chloride tablets 1 g p.o. 3 times daily 3/1 increase sodium chloride 2 g p.o. 3 times daily   # Seizure disorder Patient has history of generalized tonic-clonic seizures as well as focal seizures with left arm jerking. Continue Keppra and Vimpat Continue seizure precautions Case was discussed with neurology, recommended continue current treatment, if patient is having  breakthrough episodes then we could give extra dose of Vimpat 100 mg and increase dose of Vimpat to 200 mg twice daily 3/4 Vimpat 100 mg IV x 1 dose given and increased Vimpat 20 mg p.o. twice daily.  Patient was complaining of shaking of arm 3/5 patient was complaining of shaking of arm again, no seizures.  EEG was ordered.  And neurology consulted. EEG negative for any seizures or epileptiform changes Follow neurology for further recommendation   Anemia of chronic disease Hb 10.0-8.2--7.8--7.7--7.3--6.8 Iron studies within normal range, folic acid 6.2 at lower end, B12 within normal range Started folic acid 1 mg p.o. daily Patient denies any GI bleeding, no dark stools.  check FOBT  3/6 transfuse 1 unit of PRBC Started pantoprazole 40 mg IV twice daily just for prophylaxis for PUD GI consulted, recommended colonoscopy but patient declined, further workup as an outpatient.   # Hypomagnesemia, mag repleted. # Hypophosphatemia due to nutritional deficiency.  Phos repleted. Monitor electrolytes and replete as needed.  # Acute metabolic encephalopathy could be secondary to hyponatremia Continue fall precautions and seizure precautions Continue supportive care   Right clavicle fracture and deformity of greater tuberosity Continue as needed medication for pain control Immobilized right shoulder and nonweightbearing Seen by orthopedic surgery, recommended nonsurgical management  CKD stage IIIb Currently renal which is at baseline, continue to monitor.  Hypertension, HFpEF, no signs of volume overload at this time Held Lasix for now Continued Coreg and losartan Monitor BP and titrate medications accordingly   Vitamin D deficiency: started vitamin D 50,000 units p.o. weekly, follow with PCP to repeat vitamin D level after 3 to 6 months.   Hyperthyroidism, TSH 0.34 low and Free T4 level 1.37 elevated 2/29 started Methimazole  5 mg po BID x 3 days and 5 po daily  free T3 level 2.2  wnl FT4 level  1.37--1.10--0.96 Thyroid antibodies negative US Thyroid: Multiple nodules in the bilateral lobes, either patient needs a biopsy versus follow-up ultrasound in 1 year.  Please review complete report for details. Patient stated that she has history of overactive thyroid and she was on medication which she stopped in 2019.  Recommended to follow with endocrinologist as an outpatient.   Diarrhea patient developed loose stools on 2/29 per RN C. difficile and GI pathogen was ordered but patient's diarrhea resolved so sample was not sent. Started probiotics 2/29 s/p NS 75 ml/hr for 12 hours 3/3 stool negative for C. difficile and GI pathogen Imodium as needed ordered   Body mass index is 22.48 kg/m.  Interventions:       Diet: Regular diet DVT Prophylaxis: Subcutaneous Lovenox   Advance goals of care discussion: Full code  Family Communication: family was present at bedside, at the time of interview.  The pt provided permission to discuss medical plan with the family. Opportunity was given to ask question and all questions were answered satisfactorily.   Disposition:  Pt is from SNF, admitted with hyponatremia and AMS, still has low sodium, which precludes a safe discharge. Discharge to SNF, when stable and cleared by nephrology..  Subjective: No significant overnight events, diarrhea resolved, no seizure activities.  Denies any GI bleeding or dark stools, no abdominal pain. Patient hemoglobin was low, agreed for blood transfusion.  Overall patient is very frustrated and would like to leave the hospital and wanted to be discharged. Patient was advised that she is not stable to be discharged at this time but she cannot sign AMA if she would like to leave.    Physical Exam: General: NAD, lying comfortably Appear in no distress, affect appropriate Eyes: PERRLA ENT: Oral Mucosa Clear, moist  Neck: no JVD,  Cardiovascular: S1 and S2 Present, no Murmur,  Respiratory:  good respiratory effort, Bilateral Air entry equal and Decreased, no Crackles, no wheezes Abdomen: Bowel Sound present, Soft and no tenderness,  Skin: no rashes Extremities: no Pedal edema, no calf tenderness Neurologic: without any new focal findings Gait not checked due to patient safety concerns  Vitals:   11/04/22 0844 11/04/22 1508 11/04/22 2251 11/05/22 0821  BP: (!) 196/101 133/62 (!) 154/71 (!) 164/72  Pulse: 85 81 71 77  Resp: '18 16 15 16  '$ Temp: 98.1 F (36.7 C) 98.2 F (36.8 C) 97.8 F (36.6 C) 98.5 F (36.9 C)  TempSrc:      SpO2: 100% 100% 99% 99%  Weight:      Height:        Intake/Output Summary (Last 24 hours) at 11/05/2022 1337 Last data filed at 11/05/2022 1048 Gross per 24 hour  Intake 120 ml  Output --  Net 120 ml    Filed Weights   10/28/22 1057  Weight: 54 kg    Data Reviewed: I have personally reviewed and interpreted daily labs, tele strips, imagings as discussed above. I reviewed all nursing notes, pharmacy notes, vitals, pertinent old records I have discussed plan of care as described above with RN and patient/family.  CBC: Recent Labs  Lab 11/01/22 0651 11/02/22 0552 11/03/22 0509 11/04/22 0344 11/05/22 0529  WBC 6.1 6.3 5.5 6.7 5.6  HGB 8.0* 7.7* 7.5* 7.3* 6.8*  HCT 24.2* 23.3* 23.2* 22.6* 21.4*  MCV 86.1 87.3 88.9 88.6 89.2  PLT 210 223 200 204 201  Basic Metabolic Panel: Recent Labs  Lab 10/31/22 0329 11/01/22 0651 11/02/22 0552 11/03/22 0509 11/04/22 0344 2022/11/06 0529  NA 126* 131* 130* 125* 129* 133*  K 3.5 3.5 3.6 3.7 3.7 4.2  CL 97* 100 101 97* 106 108  CO2 21* 22 22 21* 20* 20*  GLUCOSE 99 96 97 109* 95 93  BUN '12 11 11 12 10 9  '$ CREATININE 1.13* 1.01* 0.98 0.97 0.98 0.98  CALCIUM 8.6* 8.0* 8.5* 7.7* 8.3* 8.4*  MG 1.7 1.6* 1.9 1.6* 2.5*  --   PHOS 2.3* 4.8* 3.2 2.6 2.8  --     Studies: EEG adult  Result Date: 11/06/22 Lora Havens, MD     November 06, 2022 12:40 PM Patient Name: Anne Phillips MRN: [id number]  Epilepsy Attending: Lora Havens Referring Physician/Provider: Val Riles, MD Date: 2022/11/06 Duration: 20 mins Patient history: 72yo F with h/o seizures, now with ams. EEG to evaluate for seizure Level of alertness: Awake AEDs during EEG study: LEV, LCM, GBP Technical aspects: This EEG study was done with scalp electrodes positioned according to the 10-20 International system of electrode placement. Electrical activity was reviewed with band pass filter of 1-'70Hz'$ , sensitivity of 7 uV/mm, display speed of 67m/sec with a '60Hz'$  notched filter applied as appropriate. EEG data were recorded continuously and digitally stored.  Video monitoring was available and reviewed as appropriate. Description: The posterior dominant rhythm consists of 8 Hz activity of moderate voltage (25-35 uV) seen predominantly in posterior head regions, symmetric and reactive to eye opening and eye closing. Hyperventilation and photic stimulation were not performed.   IMPRESSION: This study is within normal limits. No seizures or epileptiform discharges were seen throughout the recording. A normal interictal EEG does not exclude the diagnosis of epilepsy. Priyanka OBarbra Sarks   Scheduled Meds:  amLODipine  5 mg Oral QPM   aspirin EC  81 mg Oral Daily   atorvastatin  40 mg Oral Daily   carvedilol  12.5 mg Oral BID WC   feeding supplement  237 mL Oral TID BM   folic acid  1 mg Oral Daily   gabapentin  300 mg Oral BID   lacosamide  200 mg Oral BID   levETIRAcetam  750 mg Oral BID   losartan  100 mg Oral Daily   methimazole  5 mg Oral Daily   pantoprazole (PROTONIX) IV  40 mg Intravenous Q12H   sodium chloride  2 g Oral TID WC   Vitamin D (Ergocalciferol)  50,000 Units Oral Q7 days   Continuous Infusions:    PRN Meds: acetaminophen **OR** acetaminophen, hydrALAZINE **OR** hydrALAZINE, loperamide, LORazepam, ondansetron **OR** ondansetron (ZOFRAN) IV, oxyCODONE  Time spent: 55 minutes  Author: DVal Riles MD [facility] [phone] PM  To reach On-call, see care teams to locate the attending and reach out to them via [email or web address] If 7PM-7AM, please contact night-coverage If you still have difficulty reaching the attending provider, please page the [facility](Director on Call) for [facility] on amion for assistance.

## 2022-11-05 NOTE — Progress Notes (Signed)
OT Cancellation Note  Patient Details Name: LYNNLEE KAGARISE MRN: [id number] DOB: 11-24-1950   Cancelled Treatment:    Reason Eval/Treat Not Completed: Other (comment) (attempted to see pt for OT, per nurse pt off the floor at EEG. OT will reattempt as able)  Shanon Payor, OTD OTR/L  11/05/22, 11:47 AM

## 2022-11-05 NOTE — Progress Notes (Signed)
  [facility]  ROUNDING NOTE   Subjective:   [name] is a 72 y.o.  female with past medical history including hypertension, CAD, seizure disorder, breast cancer with mastectomy, and hypothyroidism.  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    Sodium 133  Assessment/ Plan:  [name] is a 72 y.o.  female with past medical history including hypertension, CAD, seizure disorder, breast cancer with mastectomy, and hypothyroidism.  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  Will perform medication review to evaluate other contributing factors.  Sodium on admission 122.   Sodium 133, acceptable for this patient Continue salt tabs 2 g 3 times daily and encourage oral intake. Due to fluctuating oral intake, patient may continue to experience hyponatremia frequently outpatient.  2.  Chronic kidney disease stage IIIa.  Patient currently at baseline renal function.  Not currently followed by outpatient nephrology.    3. Anemia of chronic kidney disease Lab Results  Component Value Date   HGB 6.8 (L) 11/05/2022    Hemoglobin below acceptable target, will defer need for blood transfusion to primary team   LOS: 7 [place] 3/6/202411:28 AM

## 2022-11-05 NOTE — Progress Notes (Signed)
Eeg done 

## 2022-11-05 NOTE — Progress Notes (Signed)
Physical Therapy Treatment Patient Details Name: Anne Phillips MRN: [id number] DOB: 1950/11/14 Today's Date: 11/05/2022   History of Present Illness Anne Phillips is a 72 y.o. female with medical history significant of seizure disorder, hypertension, hyperlipidemia, CAD, breast cancer s/p mastectomy, hypothyroidism, who presents to the ED with complaints of altered mental status and following fall at her STR. Found to have Comminuted, displaced and overriding fracture of the distal 3rd of the right clavicular shaft    PT Comments    Patient received in bathroom with neurologist present in room who states she walked the patient to bathroom as part of assessment. Patient came out of bathroom demonstrating unsteady gait, bumping into counter on the way back to bed. Impulsive, unaware of R UE NWB, she had sling donned, but arm not in sling and using R UE. Unaware of safety and poor balance. Patient will continue to benefit from skilled PT to improve functional independence and safety with mobility.     Recommendations for follow up therapy are one component of a multi-disciplinary discharge planning process, led by the attending physician.  Recommendations may be updated based on patient status, additional functional criteria and insurance authorization.  Follow Up Recommendations  Skilled nursing-short term rehab (<3 hours/day) Can patient physically be transported by private vehicle: Yes   Assistance Recommended at Discharge Frequent or constant Supervision/Assistance  Patient can return home with the following A little help with walking and/or transfers;A little help with bathing/dressing/bathroom;Assistance with cooking/housework;Assist for transportation;Help with stairs or ramp for entrance   Equipment Recommendations  Rolling walker (2 wheels)    Recommendations for Other Services       Precautions / Restrictions Precautions Precautions: Fall Restrictions Weight Bearing  Restrictions: Yes RUE Weight Bearing: Non weight bearing     Mobility  Bed Mobility Overal bed mobility: Modified Independent Bed Mobility: Supine to Sit, Sit to Supine     Supine to sit: Modified independent (Device/Increase time) Sit to supine: Modified independent (Device/Increase time)   General bed mobility comments: cues needed for R UE NWB. Patient is non-compliant with this. Sling on, but arm is not in sling and patient using arm    Transfers Overall transfer level: Needs assistance Equipment used: None Transfers: Sit to/from Stand Sit to Stand: Supervision, Min guard           General transfer comment: patient stood from toilet independently, but is at increased fall risk with poor awareness of deficits.    Ambulation/Gait Ambulation/Gait assistance: Min guard Gait Distance (Feet): 20 Feet Assistive device: None Gait Pattern/deviations: Step-through pattern Gait velocity: increased and unsafe     General Gait Details: patient is unsteady with mobility. She was received in bathroom with Neurology present who walked her to bathroom. Patient was in bathroom independently. She came out of bathroom and ambulated back to bed with unsteady gait, bumped into counter with R UE.   Stairs             Wheelchair Mobility    Modified Rankin (Stroke Patients Only)       Balance Overall balance assessment: Needs assistance, History of Falls Sitting-balance support: No upper extremity supported, Feet supported Sitting balance-Leahy Scale: Good     Standing balance support: No upper extremity supported, During functional activity, Reliant on assistive device for balance Standing balance-Leahy Scale: Poor Standing balance comment: poor balance without UE support  Cognition Arousal/Alertness: Awake/alert Behavior During Therapy: WFL for tasks assessed/performed, Impulsive Overall Cognitive Status: Within Functional Limits  for tasks assessed                                 General Comments: Generally oriented to basic information; follows simple commands; limited insight/safety awareness        Exercises      General Comments        Pertinent Vitals/Pain Pain Assessment Pain Assessment: Faces Faces Pain Scale: Hurts a little bit Pain Location: Right knee and R shoulder Pain Descriptors / Indicators: Discomfort, Sore Pain Intervention(s): Monitored during session    Home Living                          Prior Function            PT Goals (current goals can now be found in the care plan section) Acute Rehab PT Goals Patient Stated Goal: improve her mobility. PT Goal Formulation: With patient Time For Goal Achievement: 11/13/22 Potential to Achieve Goals: Fair Progress towards PT goals: Not progressing toward goals - comment (she declined ambulating further at this time. States she has been walking all day.)    Frequency    Min 2X/week      PT Plan Current plan remains appropriate    Co-evaluation              AM-PAC PT "6 Clicks" Mobility   Outcome Measure  Help needed turning from your back to your side while in a flat bed without using bedrails?: None Help needed moving from lying on your back to sitting on the side of a flat bed without using bedrails?: None Help needed moving to and from a bed to a chair (including a wheelchair)?: A Little Help needed standing up from a chair using your arms (e.g., wheelchair or bedside chair)?: A Little Help needed to walk in hospital room?: A Little Help needed climbing 3-5 steps with a railing? : A Little 6 Click Score: 20    End of Session   Activity Tolerance: Patient limited by fatigue Patient left: in bed;with call bell/phone within reach;with bed alarm set Nurse Communication: Mobility status PT Visit Diagnosis: Difficulty in walking, not elsewhere classified (R26.2);Unsteadiness on feet  (R26.81);Muscle weakness (generalized) (M62.81);History of falling (Z91.81)     Time: 1341-1350 PT Time Calculation (min) (ACUTE ONLY): 9 min  Charges:  $Gait Training: 8-22 mins                     Laketia Vicknair, PT, GCS 11/05/22,2:04 PM

## 2022-11-06 ENCOUNTER — Other Ambulatory Visit: Payer: Self-pay

## 2022-11-06 DIAGNOSIS — E871 Hypo-osmolality and hyponatremia: Secondary | ICD-10-CM | POA: Diagnosis not present

## 2022-11-06 LAB — BASIC METABOLIC PANEL
Anion gap: 6 (ref 5–15)
BUN: 13 mg/dL (ref 8–23)
CO2: 20 mmol/L — ABNORMAL LOW (ref 22–32)
Calcium: 8.1 mg/dL — ABNORMAL LOW (ref 8.9–10.3)
Chloride: 108 mmol/L (ref 98–111)
Creatinine, Ser: 1.03 mg/dL — ABNORMAL HIGH (ref 0.44–1.00)
GFR, Estimated: 58 mL/min — ABNORMAL LOW (ref >60)
Glucose, Bld: 86 mg/dL (ref 70–99)
Potassium: 4.1 mmol/L (ref 3.5–5.1)
Sodium: 134 mmol/L — ABNORMAL LOW (ref 135–145)

## 2022-11-06 LAB — OCCULT BLOOD X 1 CARD TO LAB, STOOL: Fecal Occult Bld: POSITIVE — AB

## 2022-11-06 LAB — CBC
HCT: 23.7 % — ABNORMAL LOW (ref 36.0–46.0)
Hemoglobin: 7.7 g/dL — ABNORMAL LOW (ref 12.0–15.0)
MCH: 28.9 pg (ref 26.0–34.0)
MCHC: 32.5 g/dL (ref 30.0–36.0)
MCV: 89.1 fL (ref 80.0–100.0)
Platelets: 175 10*3/uL (ref 150–400)
RBC: 2.66 MIL/uL — ABNORMAL LOW (ref 3.87–5.11)
RDW: 15.5 % (ref 11.5–15.5)
WBC: 6.4 10*3/uL (ref 4.0–10.5)
nRBC: 0 % (ref 0.0–0.2)

## 2022-11-06 LAB — TYPE AND SCREEN
ABO/RH(D): O POS
Antibody Screen: NEGATIVE
Unit division: 0

## 2022-11-06 LAB — BPAM RBC
Blood Product Expiration Date: 202403282359
ISSUE DATE / TIME: 202403061606
Unit Type and Rh: 5100

## 2022-11-06 LAB — PHOSPHORUS: Phosphorus: 3.5 mg/dL (ref 2.5–4.6)

## 2022-11-06 LAB — MAGNESIUM: Magnesium: 2 mg/dL (ref 1.7–2.4)

## 2022-11-06 MED ORDER — CLONAZEPAM 1 MG PO TABS
1.0000 mg | ORAL_TABLET | Freq: Two times a day (BID) | ORAL | 2 refills | Status: DC
  Filled 2022-11-06: qty 60, 30d supply, fill #0

## 2022-11-06 MED ORDER — CARVEDILOL 12.5 MG PO TABS: 12.5000 mg | ORAL_TABLET | Freq: Two times a day (BID) | ORAL | Status: DC

## 2022-11-06 MED ORDER — FOLIC ACID 1 MG PO TABS
1.0000 mg | ORAL_TABLET | Freq: Every day | ORAL | 0 refills | Status: AC
  Filled 2022-11-06: qty 90, 90d supply, fill #0

## 2022-11-06 MED ORDER — LEVETIRACETAM 250 MG PO TABS
250.0000 mg | ORAL_TABLET | Freq: Every day | ORAL | 11 refills | Status: DC
  Filled 2022-11-06: qty 30, 30d supply, fill #0

## 2022-11-06 MED ORDER — LEVETIRACETAM 500 MG PO TABS
500.0000 mg | ORAL_TABLET | Freq: Three times a day (TID) | ORAL | 11 refills | Status: DC
  Filled 2022-11-06: qty 90, 30d supply, fill #0

## 2022-11-06 MED ORDER — HYDRALAZINE HCL 50 MG PO TABS
50.0000 mg | ORAL_TABLET | Freq: Three times a day (TID) | ORAL | 0 refills | Status: DC | PRN
  Filled 2022-11-06: qty 60, 20d supply, fill #0

## 2022-11-06 MED ORDER — PANTOPRAZOLE SODIUM 40 MG PO TBEC
40.0000 mg | DELAYED_RELEASE_TABLET | Freq: Every day | ORAL | 1 refills | Status: DC
  Filled 2022-11-06: qty 30, 30d supply, fill #0

## 2022-11-06 MED ORDER — AMLODIPINE BESYLATE 5 MG PO TABS
5.0000 mg | ORAL_TABLET | Freq: Every evening | ORAL | 2 refills | Status: DC
  Filled 2022-11-06: qty 30, 30d supply, fill #0

## 2022-11-06 MED ORDER — CLONAZEPAM 1 MG PO TBDP
1.0000 mg | ORAL_TABLET | Freq: Two times a day (BID) | ORAL | 2 refills | Status: DC
  Filled 2022-11-06: qty 60, 30d supply, fill #0

## 2022-11-06 MED ORDER — METHIMAZOLE 5 MG PO TABS
5.0000 mg | ORAL_TABLET | Freq: Every day | ORAL | 11 refills | Status: DC
  Filled 2022-11-06: qty 30, 30d supply, fill #0

## 2022-11-06 MED ORDER — VITAMIN D (ERGOCALCIFEROL) 1.25 MG (50000 UNIT) PO CAPS
50000.0000 [IU] | ORAL_CAPSULE | ORAL | 0 refills | Status: AC
  Filled 2022-11-06: qty 12, 84d supply, fill #0

## 2022-11-06 NOTE — TOC Progression Note (Signed)
Transition of Care [facility]) - Progression Note    Patient Details  Name: ANDRE LICHT MRN: [id number] Date of Birth: 12/26/1950  Transition of Care [facility]) CM/SW [place], RN Phone Number: 11/06/2022, 1:43 PM  Clinical Narrative:     [facility] accepted the patient for [facility] services, PT, OT aide, RN, I called [facility] to set up assessment to get PCS services Provided by [place] at [phone], The patient was able to provide them with verbal consent to do the Assessment, they will set up services at home once they complete the home assessment She has a rolling walker at home Her sister [name] did not pick up the phone when called She will be transported home by [facility] [employer]   Expected Discharge Plan: [place]    Expected Discharge Plan and Services   Discharge Planning Services: CM Consult                     DME Arranged: N/A DME Agency: NA       HH Arranged: PT, RN, OT, Nurse's Aide [place] Agency: [place] Date [facility] Agency Contacted: 11/06/22 Time [place]: 63 Representative spoke with at [place]: [place]   Social Determinants of Health (SDOH) Interventions SDOH Screenings   Food Insecurity: No Food Insecurity (10/28/2022)  Housing: Low Risk  (10/28/2022)  Transportation Needs: No Transportation Needs (10/28/2022)  Utilities: Not At Risk (10/28/2022)  Tobacco Use: High Risk (10/28/2022)    Readmission Risk Interventions    10/12/2022   11:51 AM  Readmission Risk Prevention Plan  PCP or Specialist Appt within 3-5 Days Complete  Social Work Consult for [employer] Planning/Counseling [place] Not Applicable

## 2022-11-06 NOTE — TOC Progression Note (Signed)
Transition of Care [facility]) - Progression Note    Patient Details  Name: Anne Phillips MRN: [id number] Date of Birth: 1950/11/15  Transition of Care [facility]) CM/SW [place], RN Phone Number: 11/06/2022, 10:41 AM  Clinical Narrative:   Spoke with [facility], and she informs me that patient will require a new authorization for SNF placement.  New authorization submitted for SNF placement.  SNF authorization pending ID number is [id number].  SNF authorization pending at this time.  TOC will continue to follow for discharge planning.           Expected Discharge Plan and Services                                               Social Determinants of Health (SDOH) Interventions SDOH Screenings   Food Insecurity: No Food Insecurity (10/28/2022)  Housing: Low Risk  (10/28/2022)  Transportation Needs: No Transportation Needs (10/28/2022)  Utilities: Not At Risk (10/28/2022)  Tobacco Use: High Risk (10/28/2022)    Readmission Risk Interventions    10/12/2022   11:51 AM  Readmission Risk Prevention Plan  PCP or Specialist Appt within 3-5 Days Complete  Social Work Consult for [place] Planning/Counseling Complete  Palliative Care Screening Not Applicable

## 2022-11-06 NOTE — Care Management Important Message (Signed)
Important Message  Patient Details  Name: Anne Phillips MRN: [id number] Date of Birth: 1951-07-13   Medicare Important Message Given:  Yes     [name] 11/06/2022, 2:25 PM

## 2022-11-06 NOTE — Progress Notes (Signed)
Discharge Note: Reviewed discharge instructions with pt. Pt discharged with all personal belongings. IV intact when removed. Staff wheeled pt out. Pt transported to home via non-emergency safe transportation.

## 2022-11-06 NOTE — TOC Progression Note (Signed)
Transition of Care [facility]) - Progression Note    Patient Details  Name: SIENNAH MAJEWSKI MRN: [id number] Date of Birth: 1950/10/23  Transition of Care [facility]) CM/SW [place], RN Phone Number: 11/06/2022, 3:37 PM  Clinical Narrative:     The patient's sister Helene Kelp called, I explained to her that the patient chose to go home, I explained that we strongly encouraged the patient to go to STR  I explained that the patient is alert and oriented and can make her choices and she strongly stated that she is going home  I explained that we set up [facility] and PCS will assess for services as well  Expected Discharge Plan: [place]    Expected Discharge Plan and Services   Discharge Planning Services: CM Consult                     DME Arranged: N/A DME Agency: NA       HH Arranged: PT, RN, OT, Nurse's Aide [place] Agency: [place] Date [facility] Agency Contacted: 11/06/22 Time [place]: 103 Representative spoke with at [place]: [place]   Social Determinants of Health ([place]) Interventions SDOH Screenings   Food Insecurity: No Food Insecurity (10/28/2022)  Housing: Low Risk  (10/28/2022)  Transportation Needs: No Transportation Needs (10/28/2022)  Utilities: Not At Risk (10/28/2022)  Tobacco Use: High Risk (10/28/2022)    Readmission Risk Interventions    10/12/2022   11:51 AM  Readmission Risk Prevention Plan  PCP or Specialist Appt within 3-5 Days Complete  Social Work Consult for [place] Planning/Counseling [place] Not Applicable

## 2022-11-06 NOTE — Plan of Care (Signed)
  Problem: Health Behavior/Discharge Planning: Goal: Ability to manage health-related needs will improve Outcome: Progressing   Problem: Health Behavior/Discharge Planning: Goal: Ability to manage health-related needs will improve Outcome: Progressing   Problem: Activity: Goal: Risk for activity intolerance will decrease Outcome: Progressing   Problem: Elimination: Goal: Will not experience complications related to urinary retention Outcome: Progressing   Problem: Pain Managment: Goal: General experience of comfort will improve Outcome: Progressing   Problem: Safety: Goal: Ability to remain free from injury will improve Outcome: Progressing   Problem: Skin Integrity: Goal: Risk for impaired skin integrity will decrease Outcome: Progressing

## 2022-11-06 NOTE — Discharge Summary (Signed)
[facility] Discharge Summary   Patient: Anne Phillips [id number]  PCP: Val Riles, MD  Date of admission: 10/28/2022   Date of discharge:  11/06/2022     Discharge Diagnoses:  Principal Problem:   Hyponatremia Active Problems:   Acute encephalopathy   Seizure disorder ([place])   (HFpEF) heart failure with preserved ejection fraction ([place])   Hypertension   Stage 3b chronic kidney disease ([place])   Admitted From: Home Disposition:  Home with HHPT/OT/RN (Refused SNF)  Recommendations for Outpatient Follow-up:  F/u with PCP in 1 wk, repeat CBC and BPM in 1 wk F/u GI for EGD and colonoscopy as an outpatient, patient's hemoglobin dropped, received 1 unit of PRBC transfusion, fecal occult blood positive.  Patient denied GI workup as an inpatient.   Follow-up with neurology in 1 to 2 weeks Follow-up with endocrinologist in 1 to 2 weeks for hypothyroidism management Follow-up with nephrology as per schedule Follow up LABS/TEST:  CBC and BMP in 1 wk   Contact information for after-discharge care     [facility] .   Service: Skilled Nursing Contact information: [place] [phone]                    Diet recommendation: Cardiac diet  Activity: The patient is advised to gradually reintroduce usual activities, as tolerated  Discharge Condition: stable  Code Status: Full code   History of present illness: As per the H and P dictated on admission Hospital Course:  Anne Phillips is a 72 y.o. female with medical history significant of seizure disorder, hypertension, hyperlipidemia, CAD, breast cancer s/p mastectomy, hypothyroidism, who presents to the ED with complaints of altered mental status.  Patient is very poor historian, she fell couple of days ago, does not remember the details, she had fracture of right  clavicle.  Patient has remote history of seizures.  As per patient's sister patient has been confused recently. ED workup: Hyponatremia sodium 124, creatinine 1.23 slightly elevated, CK2 43 CT right shoulder: Comminuted, displaced and overriding fracture of the distal 3rd of the right clavicular shaft with moderate surrounding soft tissue swelling. 2. Deformity of the greater tuberosity may relate to an old fracture as correlated with previous radiographs. A mild superimposed acute impaction fracture is difficult to exclude. TRH contacted for admission for symptomatic hyponatremia.    Assessment and Plan: # Hyponatremia, unknown cause could be SIADH versus hypovolemic hyponatremia. Na 22, Serum osmolality 261 low.  Urine osmolarity 311 and urine sodium <10. S/p 500 mL NS bolus, Na 122 --->134 today. Nephrology consulted, no indication for 3% saline at this time. On 2/28 started sodium chloride tablets 1 g p.o. 3 times daily. On 3/1 increase sodium chloride 2 g p.o. 3 times daily.  Cleared by nephrology to discharge and follow-up as an outpatient.  Repeat BMP after 1 week and follow with PCP. # Seizure disorder: Patient has history of generalized tonic-clonic seizures as well as focal seizures with left arm jerking. Continue Keppra and Vimpat Case was discussed with neurology, recommended continue current treatment, if patient is having breakthrough episodes then we could give extra dose of Vimpat 100 mg and increase dose of Vimpat to 200 mg twice daily. On 3/4 Vimpat 100 mg IV x 1 dose given and increased Vimpat 200 mg p.o. twice daily. On 3/4 Patient was complaining of shaking of arm.  On 3/5 patient was complaining of shaking of arm again, no seizures.  EEG was ordered.  And neurology consulted. EEG negative for any seizures or epileptiform changes.  Neurology started her back on Keppra 500 mg p.o. 3 times daily and to 50 mg nightly, Vimpat 150 mg p.o. twice daily and started Klonopin 1 mg p.o. twice daily  home dose.  Continue gabapentin 300 mg po BID. # Anemia of chronic disease and GI bleeding.  Hb 10.0-8.2---6.8, Iron studies within normal range, folic acid 6.2 at lower end, B12 within normal range. Started folic acid 1 mg p.o. daily. Patient denies any GI bleeding, no dark stools. FOBT positive today just before discharge. On 3/6 transfused 1 unit of PRBC, Hb 7.7 today. S/p pantoprazole 40 mg IV twice daily just for prophylaxis for PUD was started during hospital stay. GI consulted, recommended colonoscopy but patient declined, further workup as an outpatient. Patient was discharged on pantoprazole 40 mg p.o. daily.  Advised to continue folic acid and repeat CBC after 1 week and follow with PCP and GI as an outpatient.   # Hypomagnesemia, mag repleted.  Resolved # Hypophosphatemia due to nutritional deficiency.  Phos repleted.  Resolved # Acute metabolic encephalopathy could be secondary to hyponatremia. Resolved, patient is back to her baseline. # Right clavicle fracture and deformity of greater tuberosity. Continue as needed medication for pain control. Immobilized right shoulder and nonweightbearing. Seen by orthopedic surgery, recommended nonsurgical management # CKD stage IIIb, renal functions are at baseline, stable.  Follow with nephrology as an outpatient. # Hypertension, HFpEF, no signs of volume overload at this time. Held Lasix during hospital stay and discontinued on discharge. Continued Coreg and losartan.  Started amlodipine 5 mg p.o. daily due to persistent high blood pressure and started hydralazine 50 mg p.o. 3 times daily if SBP greater than 150 mmHg.  Patient was advised to monitor BP at home and follow with PCP to titrate medications accordingly.  # Vitamin D deficiency: started vitamin D 50,000 units p.o. weekly, follow with PCP to repeat vitamin D level after 3 to 6 months. # Hyperthyroidism, TSH 0.34 low and Free T4 level 1.37 elevated 2/29 started Methimazole 5 mg po BID x 3  days and 5 po daily. free T3 level 2.2 wnl. FT4 level  1.37--1.10--0.96. Thyroid antibodies negative.  US Thyroid: Multiple nodules in the bilateral lobes, either patient needs a biopsy versus follow-up ultrasound in 1 year.  Please review complete report for details. Patient stated that she has history of overactive thyroid and she was on medication which she stopped in 2019.  Recommended to follow with endocrinologist as an outpatient. # Diarrhea patient developed loose stools on 2/29 per RN C. difficile and GI pathogen was ordered but patient's diarrhea resolved so sample was not sent. S/p probiotics. On 2/29 s/p NS 75 ml/hr for 12 hours 3/3 stool negative for C. difficile and GI pathogen. Imodium as needed ordered Diarrhea resolved.  Body mass index is 22.48 kg/m.  Nutrition Problem: Inadequate oral intake Etiology: poor appetite Nutrition Interventions: Interventions: Magic cup, MVI  Patient was seen by physical therapy, who recommended SNF placement but patient refused to go to SNF and wanted to go home. So HH was rranged. On the day of the discharge the patient's vitals were stable, and no other acute medical condition were reported by patient. the patient was felt safe to be discharge at Home with Home health.  Consultants: Neurology, nephrology, orthopedics, GI Procedures: EEG  Discharge Exam: General:  Appear in no distress, no Rash; Oral Mucosa Clear, moist. Cardiovascular: S1 and S2 Present, no Murmur, Respiratory: normal respiratory effort, Bilateral Air entry present and no Crackles, no wheezes Abdomen: Bowel Sound present, Soft and no tenderness, no hernia Extremities: no Pedal edema, no calf tenderness Neurology: alert and oriented to time, place, and person affect appropriate.  Filed Weights   10/28/22 1057  Weight: 54 kg   Vitals:   11/05/22 2320 11/06/22 0803  BP: 134/68 (!) 147/85  Pulse: 80 67  Resp: 15 16  Temp: 98.1 F (36.7 C) 98.6 F (37 C)  SpO2:  100% 100%    DISCHARGE MEDICATION: Allergies as of 11/06/2022       Reactions   Iodinated Contrast Media Hives, Other (See Comments)   Rash    Other Hives   Metal-reaction=rash Metal-reaction=rash        Medication List     STOP taking these medications    furosemide 20 MG tablet Commonly known as: LASIX       TAKE these medications    acetaminophen 325 MG tablet Commonly known as: TYLENOL Take 2 tablets (650 mg total) by mouth every 6 (six) hours as needed for mild pain (or Fever >/= 101).   amLODipine 5 MG tablet Commonly known as: NORVASC Take 1 tablet (5 mg total) by mouth every evening. Skip the dose if systolic BP less than [id number] mmHg   aspirin EC 81 MG tablet Take 1 tablet (81 mg total) by mouth daily. Swallow whole.   atorvastatin 40 MG tablet Commonly known as: LIPITOR Take 40 mg by mouth daily.   carvedilol 12.5 MG tablet Commonly known as: COREG Take 1 tablet (12.5 mg total) by mouth 2 (two) times daily with a meal. What changed:  medication strength how much to take   clonazePAM 1 MG tablet Commonly known as: KlonoPIN Take 1 tablet (1 mg total) by mouth 2 (two) times daily.   cyanocobalamin 1000 MCG tablet Take 1 tablet (1,000 mcg total) by mouth daily.   empagliflozin 10 MG Tabs tablet Commonly known as: JARDIANCE Take 10 mg by mouth daily.   feeding supplement Liqd Take 237 mLs by mouth 2 (two) times daily between meals.   folic acid 1 MG tablet Commonly known as: FOLVITE Take 1 tablet (1 mg total) by mouth daily. Start taking on: November 07, 2022   gabapentin 300 MG capsule Commonly known as: NEURONTIN Take 1 capsule (300 mg total) by mouth 2 (two) times daily.   hydrALAZINE 50 MG tablet Commonly known as: APRESOLINE Take 1 tablet (50 mg total) by mouth every 8 (eight) hours as needed (SBP >150).   Lacosamide 150 MG Tabs Commonly known as: Vimpat Take 1 tablet (150 mg total) by mouth in the morning and at bedtime.    levETIRAcetam 500 MG tablet Commonly known as: KEPPRA Take 1 tablet (500 mg total) by mouth 3 (three) times daily. What changed:  medication strength how much to take when to take this   levETIRAcetam 250 MG tablet Commonly known as: KEPPRA Take 1 tablet (250 mg total) by mouth at bedtime. What changed: You were already taking a medication with the same name, and this prescription was added. Make sure you understand how and when to take each.   losartan 100 MG tablet Commonly known as: COZAAR Take 100 mg by mouth daily.   methimazole 5 MG tablet Commonly known as: TAPAZOLE Take 1 tablet (5 mg total) by mouth daily. Start taking on: November 07, 2022   multivitamin with minerals Tabs tablet Take 1 tablet by mouth daily.   nicotine 14 mg/24hr patch Commonly known as: NICODERM CQ - dosed in mg/24 hours Place 1 patch (14 mg total) onto the skin daily.   nitroGLYCERIN 0.4 MG SL tablet Commonly known as: NITROSTAT Place under the tongue.   pantoprazole 40 MG tablet Commonly known as: Protonix Take 1 tablet (40 mg total) by mouth daily.   tacrolimus 0.1 % ointment Commonly known as: PROTOPIC Apply 1 Application topically daily as needed.   Vitamin D (Ergocalciferol) 1.25 MG (50000 UNIT) Caps capsule Commonly known as: DRISDOL Take 1 capsule (50,000 Units total) by mouth every 7 (seven) days. Start taking on: November 12, 2022       Allergies  Allergen Reactions   Iodinated Contrast Media Hives and Other (See Comments)    Rash    Other Hives    Metal-reaction=rash Metal-reaction=rash    Discharge Instructions     Call MD for:   Complete by: As directed    Seizures   Call MD for:  extreme fatigue   Complete by: As directed    Call MD for:  persistant dizziness or light-headedness   Complete by: As directed    Call MD for:  persistant nausea and vomiting   Complete by: As directed    Call MD for:  severe uncontrolled pain   Complete by: As directed    Call MD  for:  temperature >100.4   Complete by: As directed    Diet - low sodium heart healthy   Complete by: As directed    Discharge instructions   Complete by: As directed    F/u with PCP in 1 wk, repeat CBC and BPM in 1 wk F/u GI for EGD and colonoscopy as an outpatient, patient's hemoglobin dropped, received 1 unit of PRBC transfusion, fecal occult blood positive.  Patient denied GI workup as an inpatient.   Follow-up with neurology in 1 to 2 weeks   Increase activity slowly   Complete by: As directed        The results of significant diagnostics from this hospitalization (including imaging, microbiology, ancillary and laboratory) are listed below for reference.    Significant Diagnostic Studies: EEG adult  Result Date: 2022/11/13 Lora Havens, MD     11/13/22 12:40 PM Patient Name: Anne Phillips MRN: [id number] Epilepsy Attending: Lora Havens Referring Physician/Provider: Val Riles, MD Date: 11/13/2022 Duration: 20 mins Patient history: 72yo F with h/o seizures, now with ams. EEG to evaluate for seizure Level of alertness: Awake AEDs during EEG study: LEV, LCM, GBP Technical aspects: This EEG study was done with scalp electrodes positioned according to the 10-20 International system of electrode placement. Electrical activity was reviewed with band pass filter of 1-'70Hz'$ , sensitivity of 7 uV/mm, display speed of 17m/sec with a '60Hz'$  notched filter applied as appropriate. EEG data were recorded continuously and digitally stored.  Video monitoring was available and reviewed as appropriate. Description: The posterior dominant rhythm consists of 8 Hz activity of moderate voltage (25-35 uV) seen predominantly in posterior head regions, symmetric and reactive to eye opening and eye closing. Hyperventilation and photic stimulation were not performed.   IMPRESSION: This study is within normal limits. No seizures or epileptiform discharges were seen throughout the recording. A normal interictal  EEG does not exclude the diagnosis of epilepsy. PLora Havens  [place]  Result Date: 10/31/2022 CLINICAL DATA:  Hyperthyroid.  Known history  of thyroid nodules. EXAM: THYROID ULTRASOUND TECHNIQUE: Ultrasound examination of the thyroid gland and adjacent soft tissues was performed. COMPARISON:  08/03/2021 FINDINGS: Parenchymal Echotexture: Mildly heterogenous Isthmus: 0.3 cm Right lobe: 6.3 x 2.8 x 2.5 cm Left lobe: 5.4 x 2.5 x 2.2 cm _________________________________________________________ Estimated total number of nodules >/= 1 cm: 4 Number of spongiform nodules >/=  2 cm not described below (TR1): 0 Number of mixed cystic and solid nodules >/= 1.5 cm not described below ([place]): 0 _________________________________________________________ Nodule # 1: Prior biopsy: No Location: Right; Mid Maximum size: 2.5 cm; Other 2 dimensions: 1.7 x 1.8 cm, previously, 2.5 cm Composition: solid/almost completely solid (2) Echogenicity: isoechoic (1) Shape: not taller-than-wide (0) Margins: ill-defined (0) Echogenic foci: none (0) ACR TI-RADS total points: 3. ACR TI-RADS risk category:  TR3 (3 points). Significant change in size (>/= 20% in two dimensions and minimal increase of 2 mm): No Change in features: Yes Change in ACR TI-RADS risk category: Yes ACR TI-RADS recommendations: This nodule technically meets criteria for fine-needle aspiration. However, it is stable in size and appearance since the 2022 study and follow-up ultrasound in 1 year could also be considered. _________________________________________________________ Nodule # 2: Prior biopsy: No Location: Right; Inferior Maximum size: 3.7 cm; Other 2 dimensions: 3.3 x 3.3 cm, previously, 3.5 cm Composition: solid/almost completely solid (2) Echogenicity: isoechoic (1) Shape: not taller-than-wide (0) Margins: ill-defined (0) Echogenic foci: none (0) ACR TI-RADS total points: 3. ACR TI-RADS risk category:  TR3 (3 points). Significant change in size (>/= 20% in  two dimensions and minimal increase of 2 mm): No Change in features: Yes Change in ACR TI-RADS risk category: Yes ACR TI-RADS recommendations: This nodule also technically meets criteria for fine-needle aspiration. It is relatively stable in size and appears similar to the prior study. One year follow-up ultrasound could also be considered. _________________________________________________________ Nodule # 3: Prior biopsy: No Location: Left; Inferior Maximum size: 2.1 cm; Other 2 dimensions: 1.9 x 2.1 cm, previously, 2.4 cm Composition: solid/almost completely solid (2) Echogenicity: isoechoic (1) Shape: not taller-than-wide (0) Margins: smooth (0) Echogenic foci: none (0) ACR TI-RADS total points: 3. ACR TI-RADS risk category:  TR3 (3 points). Significant change in size (>/= 20% in two dimensions and minimal increase of 2 mm): No Change in features: No Change in ACR TI-RADS risk category: No ACR TI-RADS recommendations: *Given size (>/= 1.5 - 2.4 cm) and appearance, a follow-up ultrasound in 1 year should be considered based on TI-RADS criteria. _________________________________________________________ Nodule # 4: Location: Left; Inferior Maximum size: 1.2 cm; Other 2 dimensions: 1.2 x 0.9 cm Composition: solid/almost completely solid (2) Echogenicity: isoechoic (1) Shape: not taller-than-wide (0) Margins: smooth (0) Echogenic foci: none (0) ACR TI-RADS total points: 3. ACR TI-RADS risk category: TR3 (3 points). ACR TI-RADS recommendations: Given size (<1.4 cm) and appearance, this nodule does NOT meet TI-RADS criteria for biopsy or dedicated follow-up. _________________________________________________________ No enlarged or abnormal appearing lymph nodes are identified. IMPRESSION: 1. Nodules 1 and 2 in the right lobe meet criteria for fine-needle aspiration. However, these are stable in size and appearance since the prior study in 2022 and a 1 year follow-up ultrasound could also be considered. 2. Stable 2.1 cm  left inferior thyroid nodule (nodule 3). Recommend 1 year follow-up ultrasound. 3. A 1.2 cm left inferior nodule not previously described does not meet criteria for biopsy or dedicated follow-up. The above is in keeping with the ACR TI-RADS recommendations - J Am Coll Radiol 2017;14:587-595. Electronically Signed   By:  Aletta Edouard M.D.   On: 10/31/2022 16:18   CT Shoulder Right Wo Contrast  Result Date: 10/28/2022 CLINICAL DATA:  Shoulder pain since falling 2 days ago. Comminuted right clavicle fracture. EXAM: CT OF THE UPPER RIGHT EXTREMITY WITHOUT CONTRAST TECHNIQUE: Multidetector CT imaging of the right shoulder was performed according to the standard protocol. RADIATION DOSE REDUCTION: This exam was performed according to the departmental dose-optimization program which includes automated exposure control, adjustment of the mA and/or kV according to patient size and/or use of iterative reconstruction technique. COMPARISON:  Radiographs 10/28/2022 and 10/03/2020. FINDINGS: Bones/Joint/Cartilage There is a comminuted fracture of the distal 3rd of the right clavicular shaft. This fracture is associated with 1 shaft with of inferior displacement of the distal fragment and 2.6 cm of overriding of the fracture fragments. The acromioclavicular joint is intact. The sternoclavicular joint is not imaged. The humeral head is located. There is deformity of the greater tuberosity which may relate to an old fracture as correlated with previous radiographs. A mild acute superimposed impaction fracture is difficult to exclude. No evidence of acute scapular fracture. No significant shoulder joint effusion. Ligaments Suboptimally assessed by CT. Muscles and Tendons No focal rotator cuff muscular atrophy or hematoma identified. No gross rotator cuff impingement. Soft tissues There is moderate soft tissue swelling/hemorrhage surrounding the comminuted clavicle fracture. No focal hematoma or other fluid collection. No  evidence of foreign body or soft tissue emphysema. No evidence of acute vascular injury. IMPRESSION: 1. Comminuted, displaced and overriding fracture of the distal 3rd of the right clavicular shaft with moderate surrounding soft tissue swelling. 2. Deformity of the greater tuberosity may relate to an old fracture as correlated with previous radiographs. A mild superimposed acute impaction fracture is difficult to exclude. 3. No evidence of acute scapular fracture. 4. No evidence of acute vascular injury. Electronically Signed   By: Richardean Sale M.D.   On: 10/28/2022 13:57   DG Chest 2 View  Result Date: 10/28/2022 CLINICAL DATA:  Provided history: Fall.  Confusion. EXAM: CHEST - 2 VIEW COMPARISON:  Prior chest radiographs 10/25/2021 and earlier. Thyroid ultrasound 08/03/2021. FINDINGS: Heart size at the upper limits of normal. Aortic atherosclerosis. Left basilar atelectasis on the PA radiograph, not well appreciated on the lateral view radiograph. No appreciable airspace consolidation on the right. No sizable pleural effusion or evidence of pneumothorax. Acute, displaced fracture of the mid to distal right clavicle. Thyromegaly with leftward displacement of the trachea. IMPRESSION: 1. Acute, displaced fracture of the mid-to-distal right clavicle. 2. Left basilar atelectasis present on the PA radiograph, but not well appreciated on the subsequent lateral view radiograph. 3. Thyromegaly with leftward displacement of the trachea. 4. Aortic Atherosclerosis (ICD10-I70.0). Electronically Signed   By: Kellie Simmering D.O.   On: 10/28/2022 12:13   DG Shoulder Right  Result Date: 10/28/2022 CLINICAL DATA:  Fall, pain EXAM: RIGHT SHOULDER - 2+ VIEW COMPARISON:  None Available. FINDINGS: Suspect a minimally displaced fracture of the greater tuberosity of the right humerus. Displaced and foreshortened oblique fracture of the distal third of the right clavicle. Mild glenohumeral and acromioclavicular arthrosis.  Partially imaged right chest unremarkable. IMPRESSION: 1. Suspect a minimally displaced fracture of the greater tuberosity of the right humerus. 2. Displaced and foreshortened oblique fracture of the distal third of the right clavicle. 3. Consider CT to further evaluate fracture anatomy. Electronically Signed   By: Delanna Ahmadi M.D.   On: 10/28/2022 12:13   DG Knee 2 Views Right  Result Date: 10/28/2022  CLINICAL DATA:  Fall.  Right knee pain. EXAM: RIGHT KNEE - 1-2 VIEW COMPARISON:  None Available. FINDINGS: Two view study. No acute fracture or dislocation evident. No joint effusion. Bones are diffusely demineralized. Loss of joint space noted medial and lateral compartments with prominent hypertrophic spurring. IMPRESSION: 1. No acute bony findings. 2. Degenerative changes in the medial and lateral compartments. Electronically Signed   By: Misty Stanley M.D.   On: 10/28/2022 12:09   CT Head Wo Contrast  Result Date: 10/28/2022 CLINICAL DATA:  Minor head trauma EXAM: CT HEAD WITHOUT CONTRAST CT CERVICAL SPINE WITHOUT CONTRAST TECHNIQUE: Multidetector CT imaging of the head and cervical spine was performed following the standard protocol without intravenous contrast. Multiplanar CT image reconstructions of the cervical spine were also generated. RADIATION DOSE REDUCTION: This exam was performed according to the departmental dose-optimization program which includes automated exposure control, adjustment of the mA and/or kV according to patient size and/or use of iterative reconstruction technique. COMPARISON:  10/15/2022 FINDINGS: CT HEAD FINDINGS Brain: No evidence of acute infarction, hemorrhage, hydrocephalus, extra-axial collection or mass lesion/mass effect. Generalized atrophy. Chronic small vessel ischemic low-density in the cerebral white matter. 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 Vascular: No hyperdense vessel or unexpected calcification. Skull: Right anterior scalp swelling and  hemorrhage. No acute fracture Sinuses/Orbits: Negative CT CERVICAL SPINE FINDINGS Alignment: Normal. Skull base and vertebrae: No acute fracture. No primary bone lesion or focal pathologic process. Soft tissues and spinal canal: No prevertebral fluid or swelling. No visible canal hematoma. Known multinodular goiter. No follow-up recommended unless clinically warranted (ref: J Am Coll Radiol. 2015 Feb;12(2): 143-50). Disc levels:  Ordinary degenerative change Upper chest: Partially covered right mid clavicle fracture with adjacent soft tissue swelling. IMPRESSION: 1. No evidence of acute intracranial or cervical spine injury. 2. Scalp hematoma without calvarial fracture. 3. Acute right clavicle fracture. 4. Multiple multinodular goiter, reference thyroid ultrasound with recommendation 08/03/2021. Electronically Signed   By: Jorje Guild M.D.   On: 10/28/2022 12:01   CT Cervical Spine Wo Contrast  Result Date: 10/28/2022 CLINICAL DATA:  Minor head trauma EXAM: CT HEAD WITHOUT CONTRAST CT CERVICAL SPINE WITHOUT CONTRAST TECHNIQUE: Multidetector CT imaging of the head and cervical spine was performed following the standard protocol without intravenous contrast. Multiplanar CT image reconstructions of the cervical spine were also generated. RADIATION DOSE REDUCTION: This exam was performed according to the departmental dose-optimization program which includes automated exposure control, adjustment of the mA and/or kV according to patient size and/or use of iterative reconstruction technique. COMPARISON:  10/15/2022 FINDINGS: CT HEAD FINDINGS Brain: No evidence of acute infarction, hemorrhage, hydrocephalus, extra-axial collection or mass lesion/mass effect. Generalized atrophy. Chronic small vessel ischemic low-density in the cerebral white matter. 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 Vascular: No hyperdense vessel or unexpected calcification. Skull: Right anterior scalp swelling and hemorrhage.  No acute fracture Sinuses/Orbits: Negative CT CERVICAL SPINE FINDINGS Alignment: Normal. Skull base and vertebrae: No acute fracture. No primary bone lesion or focal pathologic process. Soft tissues and spinal canal: No prevertebral fluid or swelling. No visible canal hematoma. Known multinodular goiter. No follow-up recommended unless clinically warranted (ref: J Am Coll Radiol. 2015 Feb;12(2): 143-50). Disc levels:  Ordinary degenerative change Upper chest: Partially covered right mid clavicle fracture with adjacent soft tissue swelling. IMPRESSION: 1. No evidence of acute intracranial or cervical spine injury. 2. Scalp hematoma without calvarial fracture. 3. Acute right clavicle fracture. 4. Multiple multinodular goiter, reference thyroid ultrasound with recommendation 08/03/2021. Electronically Signed  By: Jorje Guild M.D.   On: 10/28/2022 12:01   EEG adult  Result Date: 10/16/2022 Derek Jack, MD     10/16/2022  2:15 PM Routine EEG Report Anne Phillips is a 72 y.o. female with a history of seizure who is undergoing an EEG to evaluate for seizures. Report: This EEG was acquired with electrodes placed according to the International 10-20 electrode system (including Fp1, Fp2, F3, F4, C3, C4, P3, P4, O1, O2, T3, T4, T5, T6, A1, A2, Fz, Cz, Pz). The following electrodes were missing or displaced: none. The occipital dominant rhythm was primarily composed of activity around 6-8 Hz although at times frequency would reach 8.5 Hz. This activity is reactive to stimulation. Drowsiness was manifested by background fragmentation; deeper stages of sleep were identified by K complexes and sleep spindles. There was no focal slowing. There were no interictal epileptiform discharges. There were no electrographic seizures identified. Photic stimulation and hyperventilation were not performed. Impression and clinical correlation: This EEG was obtained while awake and asleep and is abnormal due to mild diffuse  slowing indicative of global cerebral dysfunction. Epileptiform abnormalities were not seen during this recording. This EEG is unchanged from that performed on 10/13/22. Su Monks, MD [facility] [phone] If 7pm- 7am, please page neurology on call as listed in [place].   CT HEAD WO CONTRAST (5MM)  Result Date: 10/15/2022 CLINICAL DATA:  Mental status change, seizure disorder, history of breast cancer EXAM: CT HEAD WITHOUT CONTRAST TECHNIQUE: Contiguous axial images were obtained from the base of the skull through the vertex without intravenous contrast. RADIATION DOSE REDUCTION: This exam was performed according to the departmental dose-optimization program which includes automated exposure control, adjustment of the mA and/or kV according to patient size and/or use of iterative reconstruction technique. COMPARISON:  10/10/2022 FINDINGS: Brain: No evidence of acute infarction, hemorrhage, mass, mass effect, or midline shift. No hydrocephalus or extra-axial fluid collection. Redemonstrated remote infarct in the right parietal lobe. Remote right thalamic lacunar infarct. Periventricular white matter changes, likely the sequela of chronic small vessel ischemic disease. Vascular: No hyperdense vessel. Skull: Negative for fracture or focal lesion. Sinuses/Orbits: No acute finding. Other: The mastoid air cells are well aerated. IMPRESSION: No acute intracranial process. Electronically Signed   By: Merilyn Baba M.D.   On: 10/15/2022 19:41   EEG adult  Result Date: 10/13/2022 Derek Jack, MD     10/13/2022  7:40 PM Routine EEG Report Anne Phillips is a 72 y.o. female with a history of seizure who is undergoing an EEG to evaluate for seizures. Report: This EEG was acquired with electrodes placed according to the International 10-20 electrode system (including Fp1, Fp2, F3, F4, C3, C4, P3, P4, O1, O2, T3, T4, T5, T6, A1, A2, Fz, Cz, Pz). The following electrodes were missing or displaced:  none. The occipital dominant rhythm was primarily composed of activity around 6-8 Hz although at times frequency would reach 8.5 Hz. This activity is reactive to stimulation. Drowsiness was manifested by background fragmentation; deeper stages of sleep were identified by K complexes and sleep spindles. There was no focal slowing. There were no interictal epileptiform discharges. There were no electrographic seizures identified. Photic stimulation and hyperventilation were not performed. Impression and clinical correlation: This EEG was obtained while awake and asleep and is abnormal due to mild diffuse slowing indicative of global cerebral dysfunction. Epileptiform abnormalities were not seen during this recording. Su Monks, MD [facility] [phone] If 7pm- 7am, please page neurology  on call as listed in [place].   MR BRAIN WO CONTRAST  Result Date: 10/10/2022 CLINICAL DATA:  Initial evaluation for neuro deficit, stroke suspected. EXAM: MRI HEAD WITHOUT CONTRAST TECHNIQUE: Multiplanar, multiecho pulse sequences of the brain and surrounding structures were obtained without intravenous contrast. COMPARISON:  Prior CT from earlier the same day. FINDINGS: Brain: Examination mildly degraded by motion artifact. Mild age-related cerebral atrophy with chronic small vessel ischemic disease. Few small remote lacunar infarcts noted about the thalami. No abnormal foci of restricted diffusion to suggest acute or subacute ischemia. Gray-white matter differentiation maintained. No areas of chronic cortical infarction. No acute intracranial hemorrhage. Single chronic microhemorrhage noted at the left occipital region, of doubtful significance in isolation. No mass lesion, midline shift or mass effect. No hydrocephalus or extra-axial fluid collection. Pituitary gland and suprasellar region within normal limits. Vascular: Major intracranial vascular flow voids are maintained. Skull and upper cervical spine:  Craniocervical junction within normal limits. Bone marrow signal intensity diffusely heterogeneous without worrisome osseous lesion. No scalp soft tissue abnormality. Sinuses/Orbits: Globes and orbital soft tissues within normal limits. Paranasal sinuses are largely clear. No significant mastoid effusion. Other: None. IMPRESSION: 1. No acute intracranial abnormality. 2. Mild age-related cerebral atrophy with chronic small vessel ischemic disease, with a few small remote lacunar infarcts about the thalami. Electronically Signed   By: Jeannine Boga M.D.   On: 10/10/2022 22:51   CT HEAD CODE STROKE WO CONTRAST  Result Date: 10/10/2022 CLINICAL DATA:  Code stroke.  Slurred speech and facial droop. EXAM: CT HEAD WITHOUT CONTRAST TECHNIQUE: Contiguous axial images were obtained from the base of the skull through the vertex without intravenous contrast. RADIATION DOSE REDUCTION: This exam was performed according to the departmental dose-optimization program which includes automated exposure control, adjustment of the mA and/or kV according to patient size and/or use of iterative reconstruction technique. COMPARISON:  Head CT 10/25/2021 and MRI 08/02/2021 FINDINGS: Brain: There is no evidence of an acute infarct, intracranial hemorrhage, mass, midline shift, or extra-axial fluid collection. A small chronic cortical/subcortical infarct in the right parietal lobe is unchanged. Hypodensities elsewhere in the cerebral white matter bilaterally are also unchanged and nonspecific but compatible with mild chronic small vessel ischemic disease. There is a chronic lacunar infarct in the right thalamus. Mild cerebral atrophy is within normal limits for age. Vascular: Calcified atherosclerosis at the skull base. No hyperdense vessel. Skull: No acute fracture or suspicious osseous lesion. Sinuses/Orbits: Visualized paranasal sinuses and mastoid air cells are clear. Unremarkable orbits. Other: None. ASPECTS [facility] Stroke  Program Early CT Score) - Ganglionic level infarction (caudate, lentiform nuclei, internal capsule, insula, M1-M3 cortex): 7 - Supraganglionic infarction (M4-M6 cortex): 3 Total score (0-10 with 10 being normal): 10 IMPRESSION: 1. No evidence of acute intracranial abnormality. ASPECTS of 10. 2. Mild chronic small vessel ischemic disease. These results were communicated to Dr. Cheral Marker at 7:07 pm on 10/10/2022 by text page via the [facility] messaging system. Electronically Signed   By: Logan Bores M.D.   On: 10/10/2022 19:07    Microbiology: Recent Results (from the past 240 hour(s))  MRSA Next Gen by PCR, Nasal     Status: None   Collection Time: 10/28/22  4:00 PM   Specimen: Nasal Mucosa; Nasal Swab  Result Value Ref Range Status   MRSA by PCR Next Gen NOT DETECTED NOT DETECTED Final    Comment: (NOTE) The GeneXpert MRSA Assay (FDA approved for NASAL specimens only), is one component of a comprehensive MRSA colonization surveillance  program. It is not intended to diagnose MRSA infection nor to guide or monitor treatment for MRSA infections. Test performance is not FDA approved in patients less than 76 years old. Performed at [facility], [place], [place] [address]   C Difficile Quick Screen w PCR reflex     Status: None   Collection Time: 11/02/22 10:30 AM   Specimen: STOOL  Result Value Ref Range Status   C Diff antigen NEGATIVE NEGATIVE Final   C Diff toxin NEGATIVE NEGATIVE Final   C Diff interpretation No C. difficile detected.  Final    Comment: Performed at [facility], [address] [place],  [place] [address]  Gastrointestinal Panel by PCR , Stool     Status: None   Collection Time: 11/02/22 10:30 AM   Specimen: Stool  Result Value Ref Range Status   Campylobacter species NOT DETECTED NOT DETECTED Final   Plesimonas shigelloides NOT DETECTED NOT DETECTED Final   Salmonella species NOT DETECTED NOT DETECTED Final   Yersinia enterocolitica NOT  DETECTED NOT DETECTED Final   Vibrio species NOT DETECTED NOT DETECTED Final   Vibrio cholerae NOT DETECTED NOT DETECTED Final   Enteroaggregative E coli (EAEC) NOT DETECTED NOT DETECTED Final   Enteropathogenic E coli (EPEC) NOT DETECTED NOT DETECTED Final   Enterotoxigenic E coli (ETEC) NOT DETECTED NOT DETECTED Final   Shiga like toxin producing E coli (STEC) NOT DETECTED NOT DETECTED Final   Shigella/Enteroinvasive E coli (EIEC) NOT DETECTED NOT DETECTED Final   Cryptosporidium NOT DETECTED NOT DETECTED Final   Cyclospora cayetanensis NOT DETECTED NOT DETECTED Final   Entamoeba histolytica NOT DETECTED NOT DETECTED Final   Giardia lamblia NOT DETECTED NOT DETECTED Final   Adenovirus F40/41 NOT DETECTED NOT DETECTED Final   Astrovirus NOT DETECTED NOT DETECTED Final   Norovirus GI/GII NOT DETECTED NOT DETECTED Final   Rotavirus A NOT DETECTED NOT DETECTED Final   Sapovirus (I, II, IV, and V) NOT DETECTED NOT DETECTED Final    Comment: Performed at [facility], [address] [place],  [address]     Labs: CBC: Recent Labs  Lab 11/02/22 [phone] 11/03/22 0509 11/04/22 0344 11/05/22 0529 11/06/22 0318  WBC 6.3 5.5 6.7 5.6 6.4  HGB 7.7* 7.5* 7.3* 6.8* 7.7*  HCT 23.3* 23.2* 22.6* 21.4* 23.7*  MCV 87.3 88.9 88.6 89.2 89.1  PLT 223 200 204 201 0000000   Basic Metabolic Panel: Recent Labs  Lab 11/01/22 0651 11/02/22 0552 11/03/22 0509 11/04/22 0344 11/05/22 0529 11/06/22 0318  NA 131* 130* 125* 129* 133* 134*  K 3.5 3.6 3.7 3.7 4.2 4.1  CL 100 101 97* 106 108 108  CO2 22 22 21* 20* 20* 20*  GLUCOSE 96 97 109* 95 93 86  BUN '11 11 12 10 9 13  '$ CREATININE 1.01* 0.98 0.97 0.98 0.98 1.03*  CALCIUM 8.0* 8.5* 7.7* 8.3* 8.4* 8.1*  MG 1.6* 1.9 1.6* 2.5*  --  2.0  PHOS 4.8* 3.2 2.6 2.8  --  3.5   Liver Function Tests: No results for input(s): "AST", "ALT", "ALKPHOS", "BILITOT", "PROT", "ALBUMIN" in the last 168 hours. No results for input(s): "LIPASE", "AMYLASE" in  the last 168 hours. No results for input(s): "AMMONIA" in the last 168 hours. Cardiac Enzymes: No results for input(s): "CKTOTAL", "CKMB", "CKMBINDEX", "TROPONINI" in the last 168 hours. BNP (last 3 results) No results for input(s): "BNP" in the last 8760 hours. CBG: No results for input(s): "GLUCAP" in the last  168 hours.  Time spent: 35 minutes  Signed:  Val Riles  [facility] 11/06/2022 4:45 PM

## 2022-12-02 ENCOUNTER — Ambulatory Visit
Admit: 2022-12-02 | Discharge: 2022-12-03 | Payer: MEDICARE | Attending: Student in an Organized Health Care Education/Training Program | Primary: Student in an Organized Health Care Education/Training Program

## 2022-12-02 DIAGNOSIS — L309 Dermatitis, unspecified: Principal | ICD-10-CM

## 2022-12-02 DIAGNOSIS — D630 Anemia in neoplastic disease: Principal | ICD-10-CM

## 2022-12-02 DIAGNOSIS — E871 Hypo-osmolality and hyponatremia: Principal | ICD-10-CM

## 2022-12-02 MED ORDER — AMMONIUM LACTATE 12 % TOPICAL CREAM
Freq: Every day | TOPICAL | 0 refills | 0 days | Status: CP | PRN
Start: 2022-12-02 — End: 2023-12-02

## 2022-12-04 DIAGNOSIS — D649 Anemia, unspecified: Principal | ICD-10-CM

## 2022-12-04 DIAGNOSIS — R195 Other fecal abnormalities: Principal | ICD-10-CM

## 2022-12-16 ENCOUNTER — Ambulatory Visit
Admit: 2022-12-16 | Discharge: 2022-12-17 | Payer: MEDICARE | Attending: Student in an Organized Health Care Education/Training Program | Primary: Student in an Organized Health Care Education/Training Program

## 2022-12-16 DIAGNOSIS — D649 Anemia, unspecified: Principal | ICD-10-CM

## 2022-12-16 DIAGNOSIS — D630 Anemia in neoplastic disease: Principal | ICD-10-CM

## 2022-12-16 DIAGNOSIS — E0590 Thyrotoxicosis, unspecified without thyrotoxic crisis or storm: Principal | ICD-10-CM

## 2022-12-16 DIAGNOSIS — N1832 Stage 3b chronic kidney disease (CMS-HCC): Principal | ICD-10-CM

## 2022-12-16 DIAGNOSIS — E871 Hypo-osmolality and hyponatremia: Principal | ICD-10-CM

## 2022-12-16 DIAGNOSIS — E041 Nontoxic single thyroid nodule: Principal | ICD-10-CM

## 2022-12-16 DIAGNOSIS — R195 Other fecal abnormalities: Principal | ICD-10-CM

## 2022-12-22 DIAGNOSIS — G40219 Localization-related (focal) (partial) symptomatic epilepsy and epileptic syndromes with complex partial seizures, intractable, without status epilepticus: Principal | ICD-10-CM

## 2022-12-22 MED ORDER — CLONAZEPAM 1 MG TABLET
ORAL_TABLET | Freq: Two times a day (BID) | ORAL | 1 refills | 90 days | Status: CP
Start: 2022-12-22 — End: ?

## 2022-12-31 ENCOUNTER — Emergency Department: Payer: Medicare HMO

## 2022-12-31 ENCOUNTER — Other Ambulatory Visit: Payer: Self-pay

## 2022-12-31 ENCOUNTER — Inpatient Hospital Stay
Admission: EM | Admit: 2022-12-31 | Discharge: 2023-01-06 | DRG: 682 | Disposition: A | Discharge status: Home Health | Payer: Medicare HMO | Attending: Student | Admitting: Student

## 2022-12-31 DIAGNOSIS — I2510 Atherosclerotic heart disease of native coronary artery without angina pectoris: Secondary | ICD-10-CM | POA: Diagnosis present

## 2022-12-31 DIAGNOSIS — G9340 Encephalopathy, unspecified: Secondary | ICD-10-CM | POA: Diagnosis not present

## 2022-12-31 DIAGNOSIS — Z9012 Acquired absence of left breast and nipple: Secondary | ICD-10-CM

## 2022-12-31 DIAGNOSIS — Z79899 Other long term (current) drug therapy: Secondary | ICD-10-CM | POA: Diagnosis not present

## 2022-12-31 DIAGNOSIS — E876 Hypokalemia: Secondary | ICD-10-CM | POA: Diagnosis present

## 2022-12-31 DIAGNOSIS — Z87891 Personal history of nicotine dependence: Secondary | ICD-10-CM | POA: Diagnosis not present

## 2022-12-31 DIAGNOSIS — N390 Urinary tract infection, site not specified: Secondary | ICD-10-CM | POA: Diagnosis not present

## 2022-12-31 DIAGNOSIS — K029 Dental caries, unspecified: Secondary | ICD-10-CM | POA: Diagnosis present

## 2022-12-31 DIAGNOSIS — E860 Dehydration: Secondary | ICD-10-CM | POA: Diagnosis present

## 2022-12-31 DIAGNOSIS — G40909 Epilepsy, unspecified, not intractable, without status epilepticus: Secondary | ICD-10-CM | POA: Diagnosis present

## 2022-12-31 DIAGNOSIS — R4781 Slurred speech: Secondary | ICD-10-CM | POA: Diagnosis present

## 2022-12-31 DIAGNOSIS — I959 Hypotension, unspecified: Secondary | ICD-10-CM | POA: Diagnosis present

## 2022-12-31 DIAGNOSIS — Z7984 Long term (current) use of oral hypoglycemic drugs: Secondary | ICD-10-CM

## 2022-12-31 DIAGNOSIS — Z853 Personal history of malignant neoplasm of breast: Secondary | ICD-10-CM

## 2022-12-31 DIAGNOSIS — N179 Acute kidney failure, unspecified: Secondary | ICD-10-CM | POA: Diagnosis present

## 2022-12-31 DIAGNOSIS — E785 Hyperlipidemia, unspecified: Secondary | ICD-10-CM | POA: Diagnosis present

## 2022-12-31 DIAGNOSIS — L03211 Cellulitis of face: Secondary | ICD-10-CM | POA: Diagnosis present

## 2022-12-31 DIAGNOSIS — Z7982 Long term (current) use of aspirin: Secondary | ICD-10-CM

## 2022-12-31 DIAGNOSIS — R410 Disorientation, unspecified: Secondary | ICD-10-CM | POA: Diagnosis not present

## 2022-12-31 DIAGNOSIS — Z9181 History of falling: Secondary | ICD-10-CM

## 2022-12-31 DIAGNOSIS — I129 Hypertensive chronic kidney disease with stage 1 through stage 4 chronic kidney disease, or unspecified chronic kidney disease: Secondary | ICD-10-CM | POA: Diagnosis present

## 2022-12-31 DIAGNOSIS — G9341 Metabolic encephalopathy: Secondary | ICD-10-CM | POA: Diagnosis present

## 2022-12-31 DIAGNOSIS — Z8249 Family history of ischemic heart disease and other diseases of the circulatory system: Secondary | ICD-10-CM

## 2022-12-31 DIAGNOSIS — K219 Gastro-esophageal reflux disease without esophagitis: Secondary | ICD-10-CM | POA: Diagnosis present

## 2022-12-31 DIAGNOSIS — E1122 Type 2 diabetes mellitus with diabetic chronic kidney disease: Secondary | ICD-10-CM | POA: Diagnosis present

## 2022-12-31 DIAGNOSIS — R296 Repeated falls: Secondary | ICD-10-CM | POA: Diagnosis present

## 2022-12-31 DIAGNOSIS — R569 Unspecified convulsions: Secondary | ICD-10-CM | POA: Diagnosis not present

## 2022-12-31 DIAGNOSIS — E1165 Type 2 diabetes mellitus with hyperglycemia: Secondary | ICD-10-CM | POA: Diagnosis present

## 2022-12-31 DIAGNOSIS — Z91041 Radiographic dye allergy status: Secondary | ICD-10-CM

## 2022-12-31 DIAGNOSIS — E0590 Thyrotoxicosis, unspecified without thyrotoxic crisis or storm: Secondary | ICD-10-CM | POA: Diagnosis present

## 2022-12-31 DIAGNOSIS — N1831 Chronic kidney disease, stage 3a: Secondary | ICD-10-CM | POA: Diagnosis present

## 2022-12-31 DIAGNOSIS — K047 Periapical abscess without sinus: Secondary | ICD-10-CM | POA: Diagnosis present

## 2022-12-31 DIAGNOSIS — R4182 Altered mental status, unspecified: Secondary | ICD-10-CM | POA: Diagnosis present

## 2022-12-31 DIAGNOSIS — Z79890 Hormone replacement therapy (postmenopausal): Secondary | ICD-10-CM

## 2022-12-31 LAB — URINALYSIS, ROUTINE W REFLEX MICROSCOPIC
Bacteria, UA: NONE SEEN
Bilirubin Urine: NEGATIVE
Glucose, UA: NEGATIVE mg/dL
Ketones, ur: NEGATIVE mg/dL
Leukocytes,Ua: NEGATIVE
Nitrite: NEGATIVE
Protein, ur: NEGATIVE mg/dL
Specific Gravity, Urine: 1.008 (ref 1.005–1.030)
Squamous Epithelial / HPF: NONE SEEN /HPF (ref 0–5)
pH: 5 (ref 5.0–8.0)

## 2022-12-31 LAB — CBC
HCT: 38.3 % (ref 36.0–46.0)
HCT: 32.6 % — ABNORMAL LOW (ref 36.0–46.0)
Hemoglobin: 12.1 g/dL (ref 12.0–15.0)
Hemoglobin: 10.3 g/dL — ABNORMAL LOW (ref 12.0–15.0)
MCH: 27.7 pg (ref 26.0–34.0)
MCH: 27.9 pg (ref 26.0–34.0)
MCHC: 31.6 g/dL (ref 30.0–36.0)
MCHC: 31.6 g/dL (ref 30.0–36.0)
MCV: 87.6 fL (ref 80.0–100.0)
MCV: 88.3 fL (ref 80.0–100.0)
Platelets: 288 10*3/uL (ref 150–400)
Platelets: 255 10*3/uL (ref 150–400)
RBC: 4.37 MIL/uL (ref 3.87–5.11)
RBC: 3.69 MIL/uL — ABNORMAL LOW (ref 3.87–5.11)
RDW: 16.2 % — ABNORMAL HIGH (ref 11.5–15.5)
RDW: 16.4 % — ABNORMAL HIGH (ref 11.5–15.5)
WBC: 7.7 10*3/uL (ref 4.0–10.5)
WBC: 7.4 10*3/uL (ref 4.0–10.5)
nRBC: 0 % (ref 0.0–0.2)
nRBC: 0 % (ref 0.0–0.2)

## 2022-12-31 LAB — HEPATIC FUNCTION PANEL
ALT: 31 U/L (ref 0–44)
AST: 49 U/L — ABNORMAL HIGH (ref 15–41)
Albumin: 3.8 g/dL (ref 3.5–5.0)
Alkaline Phosphatase: 114 U/L (ref 38–126)
Bilirubin, Direct: 0.2 mg/dL (ref 0.0–0.2)
Indirect Bilirubin: 0.5 mg/dL (ref 0.3–0.9)
Total Bilirubin: 0.7 mg/dL (ref 0.3–1.2)
Total Protein: 7.9 g/dL (ref 6.5–8.1)

## 2022-12-31 LAB — URINALYSIS, COMPLETE (UACMP) WITH MICROSCOPIC
Bacteria, UA: NONE SEEN
Bilirubin Urine: NEGATIVE
Glucose, UA: NEGATIVE mg/dL
Hgb urine dipstick: NEGATIVE
Ketones, ur: NEGATIVE mg/dL
Leukocytes,Ua: NEGATIVE
Nitrite: NEGATIVE
Protein, ur: NEGATIVE mg/dL
Specific Gravity, Urine: 1.009 (ref 1.005–1.030)
pH: 5 (ref 5.0–8.0)

## 2022-12-31 LAB — URINE DRUG SCREEN, QUALITATIVE (ARMC ONLY)
Amphetamines, Ur Screen: NOT DETECTED
Barbiturates, Ur Screen: NOT DETECTED
Benzodiazepine, Ur Scrn: NOT DETECTED
Cannabinoid 50 Ng, Ur ~~LOC~~: NOT DETECTED
Cocaine Metabolite,Ur ~~LOC~~: NOT DETECTED
MDMA (Ecstasy)Ur Screen: NOT DETECTED
Methadone Scn, Ur: NOT DETECTED
Opiate, Ur Screen: NOT DETECTED
Phencyclidine (PCP) Ur S: NOT DETECTED
Tricyclic, Ur Screen: NOT DETECTED

## 2022-12-31 LAB — CBG MONITORING, ED
Glucose-Capillary: 88 mg/dL (ref 70–99)
Glucose-Capillary: 77 mg/dL (ref 70–99)

## 2022-12-31 LAB — BASIC METABOLIC PANEL
Anion gap: 12 (ref 5–15)
BUN: 24 mg/dL — ABNORMAL HIGH (ref 8–23)
CO2: 21 mmol/L — ABNORMAL LOW (ref 22–32)
Calcium: 8.7 mg/dL — ABNORMAL LOW (ref 8.9–10.3)
Chloride: 106 mmol/L (ref 98–111)
Creatinine, Ser: 1.79 mg/dL — ABNORMAL HIGH (ref 0.44–1.00)
GFR, Estimated: 30 mL/min — ABNORMAL LOW (ref >60)
Glucose, Bld: 100 mg/dL — ABNORMAL HIGH (ref 70–99)
Potassium: 3 mmol/L — ABNORMAL LOW (ref 3.5–5.1)
Sodium: 139 mmol/L (ref 135–145)

## 2022-12-31 LAB — MAGNESIUM
Magnesium: 1.3 mg/dL — ABNORMAL LOW (ref 1.7–2.4)
Magnesium: 1.8 mg/dL (ref 1.7–2.4)

## 2022-12-31 LAB — COMPREHENSIVE METABOLIC PANEL
ALT: 27 U/L (ref 0–44)
AST: 34 U/L (ref 15–41)
Albumin: 3.5 g/dL (ref 3.5–5.0)
Alkaline Phosphatase: 102 U/L (ref 38–126)
Anion gap: 12 (ref 5–15)
BUN: 23 mg/dL (ref 8–23)
CO2: 22 mmol/L (ref 22–32)
Calcium: 8.7 mg/dL — ABNORMAL LOW (ref 8.9–10.3)
Chloride: 105 mmol/L (ref 98–111)
Creatinine, Ser: 1.5 mg/dL — ABNORMAL HIGH (ref 0.44–1.00)
GFR, Estimated: 37 mL/min — ABNORMAL LOW (ref >60)
Glucose, Bld: 95 mg/dL (ref 70–99)
Potassium: 3.1 mmol/L — ABNORMAL LOW (ref 3.5–5.1)
Sodium: 139 mmol/L (ref 135–145)
Total Bilirubin: 0.8 mg/dL (ref 0.3–1.2)
Total Protein: 7.2 g/dL (ref 6.5–8.1)

## 2022-12-31 LAB — TSH: TSH: 0.598 u[IU]/mL (ref 0.350–4.500)

## 2022-12-31 LAB — T4, FREE: Free T4: 1.2 ng/dL — ABNORMAL HIGH (ref 0.61–1.12)

## 2022-12-31 LAB — HEMOGLOBIN A1C
Hgb A1c MFr Bld: 4.6 % — ABNORMAL LOW (ref 4.8–5.6)
Mean Plasma Glucose: 85.32 mg/dL

## 2022-12-31 LAB — GLUCOSE, CAPILLARY
Glucose-Capillary: 87 mg/dL (ref 70–99)
Glucose-Capillary: 115 mg/dL — ABNORMAL HIGH (ref 70–99)

## 2022-12-31 LAB — ETHANOL: Alcohol, Ethyl (B): <10 mg/dL (ref <10)

## 2022-12-31 MED ORDER — LACTATED RINGERS IV BOLUS
1000.0000 mL | Freq: Once | INTRAVENOUS | Status: AC
  Administered 2022-12-31: 1000 mL via INTRAVENOUS

## 2022-12-31 MED ORDER — ASPIRIN 81 MG PO TBEC
81.0000 mg | DELAYED_RELEASE_TABLET | Freq: Every day | ORAL | Status: DC
  Administered 2022-12-31 – 2023-01-06 (×7): 81 mg via ORAL
  Filled 2022-12-31 (×7): qty 1

## 2022-12-31 MED ORDER — AMLODIPINE BESYLATE 5 MG PO TABS: 5.0000 mg | ORAL_TABLET | Freq: Every evening | ORAL | Status: DC

## 2022-12-31 MED ORDER — PANTOPRAZOLE SODIUM 40 MG PO TBEC
40.0000 mg | DELAYED_RELEASE_TABLET | Freq: Every day | ORAL | Status: DC
  Administered 2022-12-31 – 2023-01-06 (×7): 40 mg via ORAL
  Filled 2022-12-31 (×7): qty 1

## 2022-12-31 MED ORDER — POTASSIUM CHLORIDE CRYS ER 20 MEQ PO TBCR: 40.0000 meq | EXTENDED_RELEASE_TABLET | Freq: Two times a day (BID) | ORAL | Status: DC

## 2022-12-31 MED ORDER — SODIUM CHLORIDE 0.9% FLUSH
3.0000 mL | Freq: Two times a day (BID) | INTRAVENOUS | Status: DC
  Administered 2022-12-31 – 2023-01-05 (×8): 3 mL via INTRAVENOUS

## 2022-12-31 MED ORDER — ACETAMINOPHEN 325 MG PO TABS
650.0000 mg | ORAL_TABLET | Freq: Four times a day (QID) | ORAL | Status: DC | PRN
  Administered 2023-01-01 – 2023-01-06 (×5): 650 mg via ORAL
  Filled 2022-12-31 (×5): qty 2

## 2022-12-31 MED ORDER — LEVETIRACETAM 750 MG PO TABS
750.0000 mg | ORAL_TABLET | Freq: Every day | ORAL | Status: DC
  Administered 2022-12-31 – 2023-01-05 (×6): 750 mg via ORAL
  Filled 2022-12-31 (×7): qty 1

## 2022-12-31 MED ORDER — ADULT MULTIVITAMIN W/MINERALS CH
1.0000 | ORAL_TABLET | Freq: Every day | ORAL | Status: DC
  Administered 2022-12-31 – 2023-01-06 (×7): 1 via ORAL
  Filled 2022-12-31 (×7): qty 1

## 2022-12-31 MED ORDER — ATORVASTATIN CALCIUM 20 MG PO TABS
40.0000 mg | ORAL_TABLET | Freq: Every day | ORAL | Status: DC
  Administered 2022-12-31: 40 mg via ORAL
  Filled 2022-12-31: qty 2

## 2022-12-31 MED ORDER — MORPHINE SULFATE (PF) 2 MG/ML IV SOLN: 1.0000 mg | Freq: Four times a day (QID) | INTRAVENOUS | Status: DC | PRN

## 2022-12-31 MED ORDER — LOSARTAN POTASSIUM 50 MG PO TABS: 100.0000 mg | ORAL_TABLET | Freq: Every day | ORAL | Status: DC

## 2022-12-31 MED ORDER — HYDRALAZINE HCL 20 MG/ML IJ SOLN: 5.0000 mg | Freq: Three times a day (TID) | INTRAMUSCULAR | Status: DC | PRN

## 2022-12-31 MED ORDER — VITAMIN B-12 1000 MCG PO TABS
1000.0000 ug | ORAL_TABLET | Freq: Every day | ORAL | Status: DC
  Administered 2022-12-31 – 2023-01-06 (×7): 1000 ug via ORAL
  Filled 2022-12-31 (×6): qty 1
  Filled 2022-12-31: qty 2

## 2022-12-31 MED ORDER — MAGNESIUM SULFATE 2 GM/50ML IV SOLN
2.0000 g | Freq: Once | INTRAVENOUS | Status: AC
  Administered 2022-12-31: 2 g via INTRAVENOUS
  Filled 2022-12-31: qty 50

## 2022-12-31 MED ORDER — HYDRALAZINE HCL 50 MG PO TABS: 50.0000 mg | ORAL_TABLET | Freq: Three times a day (TID) | ORAL | Status: DC | PRN

## 2022-12-31 MED ORDER — ONDANSETRON HCL 4 MG/2ML IJ SOLN: 4.0000 mg | Freq: Four times a day (QID) | INTRAMUSCULAR | Status: DC | PRN

## 2022-12-31 MED ORDER — LEVETIRACETAM 500 MG PO TABS
500.0000 mg | ORAL_TABLET | Freq: Two times a day (BID) | ORAL | Status: DC
  Administered 2022-12-31 – 2023-01-06 (×14): 500 mg via ORAL
  Filled 2022-12-31 (×13): qty 1

## 2022-12-31 MED ORDER — ONDANSETRON HCL 4 MG PO TABS: 4.0000 mg | ORAL_TABLET | Freq: Four times a day (QID) | ORAL | Status: DC | PRN

## 2022-12-31 MED ORDER — NITROGLYCERIN 0.4 MG SL SUBL: 0.4000 mg | SUBLINGUAL_TABLET | SUBLINGUAL | Status: DC | PRN

## 2022-12-31 MED ORDER — BISACODYL 5 MG PO TBEC
5.0000 mg | DELAYED_RELEASE_TABLET | Freq: Every day | ORAL | Status: DC | PRN
  Administered 2023-01-01: 5 mg via ORAL
  Filled 2022-12-31: qty 1

## 2022-12-31 MED ORDER — ENSURE ENLIVE PO LIQD: 237.0000 mL | Freq: Two times a day (BID) | ORAL | Status: DC

## 2022-12-31 MED ORDER — HYDROCODONE-ACETAMINOPHEN 5-325 MG PO TABS: 1.0000 | ORAL_TABLET | ORAL | Status: DC | PRN

## 2022-12-31 MED ORDER — SODIUM CHLORIDE 0.9 % IV BOLUS: 1000.0000 mL | Freq: Once | INTRAVENOUS | Status: DC

## 2022-12-31 MED ORDER — INSULIN ASPART 100 UNIT/ML IJ SOLN: 0.0000 [IU] | Freq: Three times a day (TID) | INTRAMUSCULAR | Status: DC

## 2022-12-31 MED ORDER — NICOTINE 14 MG/24HR TD PT24
14.0000 mg | MEDICATED_PATCH | Freq: Every day | TRANSDERMAL | Status: DC
  Administered 2022-12-31: 14 mg via TRANSDERMAL
  Filled 2022-12-31 (×2): qty 1

## 2022-12-31 MED ORDER — CARVEDILOL 6.25 MG PO TABS
12.5000 mg | ORAL_TABLET | Freq: Two times a day (BID) | ORAL | Status: DC
  Administered 2022-12-31: 12.5 mg via ORAL
  Filled 2022-12-31: qty 2

## 2022-12-31 MED ORDER — METHIMAZOLE 5 MG PO TABS
5.0000 mg | ORAL_TABLET | Freq: Every day | ORAL | Status: DC
  Filled 2022-12-31: qty 1

## 2022-12-31 MED ORDER — ACETAMINOPHEN 650 MG RE SUPP: 650.0000 mg | Freq: Four times a day (QID) | RECTAL | Status: DC | PRN

## 2022-12-31 MED ORDER — FOLIC ACID 1 MG PO TABS
1.0000 mg | ORAL_TABLET | Freq: Every day | ORAL | Status: DC
  Administered 2022-12-31 – 2023-01-06 (×7): 1 mg via ORAL
  Filled 2022-12-31 (×7): qty 1

## 2022-12-31 MED ORDER — SENNOSIDES-DOCUSATE SODIUM 8.6-50 MG PO TABS: 1.0000 | ORAL_TABLET | Freq: Every evening | ORAL | Status: DC | PRN

## 2022-12-31 MED ORDER — ENOXAPARIN SODIUM 40 MG/0.4ML IJ SOSY: 40.0000 mg | PREFILLED_SYRINGE | INTRAMUSCULAR | Status: DC

## 2022-12-31 MED ORDER — LACOSAMIDE 50 MG PO TABS
150.0000 mg | ORAL_TABLET | Freq: Two times a day (BID) | ORAL | Status: DC
  Administered 2022-12-31 – 2023-01-06 (×13): 150 mg via ORAL
  Filled 2022-12-31 (×13): qty 3

## 2022-12-31 MED ORDER — POTASSIUM CHLORIDE 10 MEQ/100ML IV SOLN
10.0000 meq | INTRAVENOUS | Status: AC
  Administered 2022-12-31 (×4): 10 meq via INTRAVENOUS
  Filled 2022-12-31 (×4): qty 100

## 2022-12-31 MED ORDER — INSULIN ASPART 100 UNIT/ML IJ SOLN: 0.0000 [IU] | Freq: Every day | INTRAMUSCULAR | Status: DC

## 2022-12-31 MED ORDER — POTASSIUM CHLORIDE CRYS ER 20 MEQ PO TBCR
40.0000 meq | EXTENDED_RELEASE_TABLET | Freq: Once | ORAL | Status: AC
  Administered 2022-12-31: 40 meq via ORAL
  Filled 2022-12-31: qty 2

## 2022-12-31 MED ORDER — GABAPENTIN 300 MG PO CAPS
300.0000 mg | ORAL_CAPSULE | Freq: Two times a day (BID) | ORAL | Status: DC
  Administered 2022-12-31: 300 mg via ORAL
  Filled 2022-12-31: qty 1

## 2022-12-31 MED ORDER — SODIUM CHLORIDE 0.9 % IV SOLN: INTRAVENOUS | Status: DC

## 2022-12-31 NOTE — ED Notes (Signed)
Patient was last seen in her bed with all siderails up, with bed locked and in lowest position.  This nurse was sitting at the nurses station with other nurses present.  A loud thud was heard from the patient's room.  Upon entering, the patient was found on the floor.  She was assisted back into the bed by four nurses present.  Patient was reattached to the monitor, Dr. Katrinka Blazing was notified immediately.

## 2022-12-31 NOTE — Progress Notes (Signed)
       CROSS COVER NOTE  NAME: Anne Phillips MRN: [id number] DOB : 1951-01-19 ATTENDING PHYSICIAN: Anne Salter, MD    Date of Service   12/31/2022   HPI/Events of Note   Report/Request Message received from RN reporting slurred speech that has worsened per RN, [facility] 2 for speech and orientation.  On Review of chart M(r)s Anne Phillips presented to [facility] ED today with altered mental status and it was noted that she had slurred speech,she also had a similar presentation in February. Repeat CT Head today negative and MRI negative in 10/2022.  Bedside eval  M(r)s Anne Phillips is alert and able to answer orientation questions. She is able to tell me her name, DOB, and that she is in [facility]. She is generally weak and does not have any other focal deficits on exam.  HPI Anne Phillips is a 72 y.o. female with a known history of breast cancer, coronary artery disease, hypertension, hyperlipidemia, seizure disorder, hyperthyroidism presents to the emergency department for evaluation of altered mental status   Interventions   Assessment/Plan:  Continue q4H Neuro checks        To reach the provider On-Call:   7AM- 7PM see care teams to locate the attending and reach out to them via [email or web address] Password: [email or web address] 7PM-7AM contact night-coverage If you still have difficulty reaching the appropriate provider, please page the [facility] (Director on Call) for [facility] on amion for assistance  This document was prepared using Conservation officer, historic buildings and may include unintentional dictation errors.  Bishop Limbo DNP, MBA, FNP-BC, PMHNP-BC Nurse Practitioner [facility] Pager [phone]

## 2022-12-31 NOTE — ED Notes (Signed)
Pt placed on bedpan multiple times in short amount of time at pt's request. Small amounts of urine observed by this tech each time pt is finished with bedpan. Pt resting at this time. This tech remains at bedside as a Comptroller.

## 2022-12-31 NOTE — Evaluation (Signed)
Physical Therapy Evaluation Patient Details Name: Anne Phillips MRN: [id number] DOB: Aug 06, 1951 Today's Date: 12/31/2022  History of Present Illness  Pt is a 72 y.o. female with a known history of breast cancer, coronary artery disease, hypertension, hyperlipidemia, seizure disorder, hyperthyroidism presents to the emergency department for evaluation of altered mental status. Recent admission on 11/23/2022, noted for Comminuted, displaced and overriding fracture of the distal 3rd of the right clavicular shaft.   Clinical Impression  Patient lethargic but did respond to name, followed one step commands ~30% of the time. Unable to provide PLOF but per chart pt ambulatory (intermittently with AD) and her sister helps with cooking, pt lives alone. The patient required maxA to come up into sitting and modA throughout to ensure midline positioning.  Sit <> stand with maxAx2, pt unable to maintain and unsafe to attempt ambulation at this time. Returned to supine and repositioned for comfort.  Overall the patient demonstrated deficits (see "PT Problem List") that impede the patient's functional abilities, safety, and mobility and would benefit from skilled PT intervention. Recommendation is to continued skilled PT services for ongoing assessment and to improve function.        Recommendations for follow up therapy are one component of a multi-disciplinary discharge planning process, led by the attending physician.  Recommendations may be updated based on patient status, additional functional criteria and insurance authorization.  Follow Up Recommendations Can patient physically be transported by private vehicle: No     Assistance Recommended at Discharge Frequent or constant Supervision/Assistance  Patient can return home with the following  Two people to help with walking and/or transfers;Assistance with cooking/housework;Direct supervision/assist for medications management;Assist for transportation;Two  people to help with bathing/dressing/bathroom;Assistance with feeding;Help with stairs or ramp for entrance    Equipment Recommendations None recommended by PT  Recommendations for Other Services       Functional Status Assessment Patient has had a recent decline in their functional status and demonstrates the ability to make significant improvements in function in a reasonable and predictable amount of time.     Precautions / Restrictions Precautions Precautions: Fall Restrictions Weight Bearing Restrictions:  (prevoius R clavicle fracture, unclear of current healing/weight bearing status)      Mobility  Bed Mobility Overal bed mobility: Needs Assistance Bed Mobility: Supine to Sit, Sit to Supine     Supine to sit: Max assist, +2 for safety/equipment Sit to supine: Max assist, +2 for safety/equipment        Transfers Overall transfer level: Needs assistance Equipment used: 2 person hand held assist Transfers: Sit to/from Stand Sit to Stand: Max assist, +2 safety/equipment, From elevated surface                Ambulation/Gait               General Gait Details: unsafe at this time  Stairs            Wheelchair Mobility    Modified Rankin (Stroke Patients Only)       Balance Overall balance assessment: Needs assistance Sitting-balance support: Feet supported Sitting balance-Leahy Scale: Poor Sitting balance - Comments: poor-zero sitting balance, required at least modA throughout majority of sitting to maintain upright position     Standing balance-Leahy Scale: Zero                               Pertinent Vitals/Pain Pain Assessment Pain Assessment: Faces Faces Pain Scale:  No hurt    Home Living Family/patient expects to be discharged to:: Private residence Living Arrangements: Alone Available Help at Discharge: Family Type of Home: House Home Access: Stairs to enter Entrance Stairs-Rails: Left Entrance Stairs-Number  of Steps: 3-4   Home Layout: One level Home Equipment: Agricultural consultant (2 wheels);Cane - single point;Tub bench Additional Comments: Patient limited historian; information from previous documentation.    Prior Function Prior Level of Function : Needs assist             Mobility Comments: per chart pt intermittently used RW for ambulation ADLs Comments: states sister does the cooking     Hand Dominance   Dominant Hand: Right    Extremity/Trunk Assessment   Upper Extremity Assessment Upper Extremity Assessment: Generalized weakness;Difficult to assess due to impaired cognition    Lower Extremity Assessment Lower Extremity Assessment: Generalized weakness;Difficult to assess due to impaired cognition       Communication   Communication: No difficulties  Cognition Arousal/Alertness: Lethargic Behavior During Therapy: Flat affect Overall Cognitive Status: No family/caregiver present to determine baseline cognitive functioning                                 General Comments: pt did not speak to therapist, but did respond to her name, followed one step simple commands ~30% of the time        General Comments      Exercises     Assessment/Plan    PT Assessment Patient needs continued PT services  PT Problem List Decreased mobility;Decreased activity tolerance;Decreased balance;Decreased strength       PT Treatment Interventions Therapeutic activities;DME instruction;Gait training;Patient/family education;Therapeutic exercise;Balance training;Stair training;Functional mobility training;Neuromuscular re-education    PT Goals (Current goals can be found in the Care Plan section)  Acute Rehab PT Goals PT Goal Formulation: Patient unable to participate in goal setting Time For Goal Achievement: 01/14/23 Potential to Achieve Goals: Good    Frequency Min 2X/week     Co-evaluation               AM-PAC PT "6 Clicks" Mobility  Outcome Measure  Help needed turning from your back to your side while in a flat bed without using bedrails?: A Lot Help needed moving from lying on your back to sitting on the side of a flat bed without using bedrails?: A Lot Help needed moving to and from a bed to a chair (including a wheelchair)?: Total Help needed standing up from a chair using your arms (e.g., wheelchair or bedside chair)?: Total Help needed to walk in hospital room?: Total Help needed climbing 3-5 steps with a railing? : Total 6 Click Score: 8    End of Session   Activity Tolerance: Patient limited by lethargy Patient left: in bed;with call bell/phone within reach;with bed alarm set Nurse Communication: Mobility status PT Visit Diagnosis: Other abnormalities of gait and mobility (R26.89);Difficulty in walking, not elsewhere classified (R26.2)    Time: 1610-9604 PT Time Calculation (min) (ACUTE ONLY): 9 min   Charges:   PT Evaluation $PT Eval Moderate Complexity: 1 Mod          Olga Coaster PT, DPT 11:39 AM,12/31/22

## 2022-12-31 NOTE — ED Notes (Signed)
Pt has not eaten any lunch, pt sleeping and not wanting to eat currently.

## 2022-12-31 NOTE — ED Notes (Signed)
Pt did not eat breakfast, pt very sleepy and not wanting to eat.

## 2022-12-31 NOTE — ED Provider Notes (Signed)
[facility] Provider Note    Event Date/Time   First MD Initiated Contact with Patient 12/31/22 [phone]     (approximate)   History   Weakness and Altered Mental Status   HPI  Anne Phillips is a 72 y.o. female who presents to the ED for evaluation of Weakness and Altered Mental Status   I reviewed medical DC summary from 3/7.  She was admitted for encephalopathy associated with hyponatremia.  History of seizure disorder, CAD, hypothyroidism, CKD.  Keppra and Vimpat.   Patient presents to the ED for evaluation of altered mentation with past couple days.  She is unable to provide any relevant history as she is quite disoriented.  No complaints.  Thinks the year is 1970 something  Physical Exam   Triage Vital Signs: ED Triage Vitals  Enc Vitals Group     BP 12/31/22 0104 (!) 140/70     Pulse Rate 12/31/22 0104 68     Resp 12/31/22 0104 17     Temp 12/31/22 0105 98.4 F (36.9 C)     Temp Source 12/31/22 0105 Oral     SpO2 12/31/22 0054 99 %     Weight --      Height --      Head Circumference --      Peak Flow --      Pain Score --      Pain Loc --      Pain Edu? --      Excl. in GC? --     Most recent vital signs: Vitals:   12/31/22 0300 12/31/22 0420  BP: (!) 180/91 (!) 144/77  Pulse: 87 85  Resp:  17  Temp:  98.5 F (36.9 C)  SpO2: 97% 98%    General: Disoriented.  Follows basic commands in all 4 extremities without apparent deficit.  No seizure activity appreciated. CV:  Good peripheral perfusion.  Resp:  Normal effort.  Abd:  No distention.  Soft MSK:  No deformity noted.  No clear signs of trauma Neuro:  No focal deficits appreciated. Other:   Dry mucous membranes  ED Results / Procedures / Treatments   Labs (all labs ordered are listed, but only abnormal results are displayed) Labs Reviewed  BASIC METABOLIC PANEL - Abnormal; Notable for the following components:      Result Value   Potassium 3.0 (*)    CO2 21 (*)     Glucose, Bld 100 (*)    BUN 24 (*)    Creatinine, Ser 1.79 (*)    Calcium 8.7 (*)    GFR, Estimated 30 (*)    All other components within normal limits  CBC - Abnormal; Notable for the following components:   RDW 16.2 (*)    All other components within normal limits  URINALYSIS, ROUTINE W REFLEX MICROSCOPIC - Abnormal; Notable for the following components:   Color, Urine STRAW (*)    APPearance CLEAR (*)    Hgb urine dipstick SMALL (*)    All other components within normal limits  HEPATIC FUNCTION PANEL - Abnormal; Notable for the following components:   AST 49 (*)    All other components within normal limits  MAGNESIUM - Abnormal; Notable for the following components:   Magnesium 1.3 (*)    All other components within normal limits  CBG MONITORING, ED    EKG Sinus rhythm with a rate of 69 bpm leftward axis, short PR.  Nonspecific ST changes with deeply inverted  T waves laterally without STEMI  RADIOLOGY CT head interpreted by me without evidence of acute intracranial pathology CT cervical spine interpreted by me without evidence of fracture or dislocation CXR interpreted by me without evidence of acute cardiopulmonary pathology.  Official radiology report(s): CT HEAD WO CONTRAST ( )  Result Date: 12/31/2022 CLINICAL DATA:  Larey Seat out of bed EXAM: CT HEAD WITHOUT CONTRAST TECHNIQUE: Contiguous axial images were obtained from the base of the skull through the vertex without intravenous contrast. RADIATION DOSE REDUCTION: This exam was performed according to the departmental dose-optimization program which includes automated exposure control, adjustment of the mA and/or kV according to patient size and/or use of iterative reconstruction technique. COMPARISON:  CT brain 12/31/2022 FINDINGS: Brain: No acute territorial infarction, hemorrhage or intracranial mass. Mild chronic small vessel ischemic changes of the white matter. Mild atrophy. Stable ventricle size Vascular: No hyperdense  vessels.  Carotid vascular calcification Skull: Normal. Negative for fracture or focal lesion. Sinuses/Orbits: No acute finding. Other: None IMPRESSION: 1. No CT evidence for acute intracranial abnormality. 2. Atrophy and chronic small vessel ischemic changes of the white matter. Electronically Signed   By: Jasmine Pang M.D.   On: 12/31/2022 02:59   DG Chest Portable 1 View  Result Date: 12/31/2022 CLINICAL DATA:  Altered mental status and weakness EXAM: PORTABLE CHEST 1 VIEW COMPARISON:  10/28/2022 FINDINGS: Cardiac shadow is mildly prominent but stable from the prior study. Aortic calcifications are seen. Previously seen distal right clavicular fracture is not well appreciated on this exam. The lungs are clear. No acute bony abnormality is noted. IMPRESSION: No acute abnormality noted. Electronically Signed   By: Alcide Clever M.D.   On: 12/31/2022 02:15   CT HEAD WO CONTRAST  Result Date: 12/31/2022 CLINICAL DATA:  Trauma EXAM: CT HEAD WITHOUT CONTRAST CT CERVICAL SPINE WITHOUT CONTRAST TECHNIQUE: Multidetector CT imaging of the head and cervical spine was performed following the standard protocol without intravenous contrast. Multiplanar CT image reconstructions of the cervical spine were also generated. RADIATION DOSE REDUCTION: This exam was performed according to the departmental dose-optimization program which includes automated exposure control, adjustment of the mA and/or kV according to patient size and/or use of iterative reconstruction technique. COMPARISON:  None Available. FINDINGS: CT HEAD FINDINGS Brain: There is no mass, hemorrhage or extra-axial collection. The size and configuration of the ventricles and extra-axial CSF spaces are normal. There is hypoattenuation of the periventricular white matter, most commonly indicating chronic ischemic microangiopathy. Vascular: Atherosclerotic calcification of the vertebral and internal carotid arteries at the skull base. No abnormal hyperdensity of  the major intracranial arteries or dural venous sinuses. Skull: The visualized skull base, calvarium and extracranial soft tissues are normal. Sinuses/Orbits: No fluid levels or advanced mucosal thickening of the visualized paranasal sinuses. No mastoid or middle ear effusion. The orbits are normal. CT CERVICAL SPINE FINDINGS Alignment: No static subluxation. Facets are aligned. Occipital condyles are normally positioned. Skull base and vertebrae: No acute fracture. Soft tissues and spinal canal: No prevertebral fluid or swelling. No visible canal hematoma. Disc levels: No advanced spinal canal or neural foraminal stenosis. Upper chest: No pneumothorax, pulmonary nodule or pleural effusion. Other: Heterogeneous and enlarged thyroid gland. IMPRESSION: 1. No acute intracranial abnormality. 2. Chronic ischemic microangiopathy. 3. No acute fracture or static subluxation of the cervical spine. 4. Heterogeneous and enlarged thyroid gland. This has been previously evaluated with ultrasound. Electronically Signed   By: Deatra Robinson M.D.   On: 12/31/2022 01:58   CT Cervical Spine Wo  Contrast  Result Date: 12/31/2022 CLINICAL DATA:  Trauma EXAM: CT HEAD WITHOUT CONTRAST CT CERVICAL SPINE WITHOUT CONTRAST TECHNIQUE: Multidetector CT imaging of the head and cervical spine was performed following the standard protocol without intravenous contrast. Multiplanar CT image reconstructions of the cervical spine were also generated. RADIATION DOSE REDUCTION: This exam was performed according to the departmental dose-optimization program which includes automated exposure control, adjustment of the mA and/or kV according to patient size and/or use of iterative reconstruction technique. COMPARISON:  None Available. FINDINGS: CT HEAD FINDINGS Brain: There is no mass, hemorrhage or extra-axial collection. The size and configuration of the ventricles and extra-axial CSF spaces are normal. There is hypoattenuation of the periventricular  white matter, most commonly indicating chronic ischemic microangiopathy. Vascular: Atherosclerotic calcification of the vertebral and internal carotid arteries at the skull base. No abnormal hyperdensity of the major intracranial arteries or dural venous sinuses. Skull: The visualized skull base, calvarium and extracranial soft tissues are normal. Sinuses/Orbits: No fluid levels or advanced mucosal thickening of the visualized paranasal sinuses. No mastoid or middle ear effusion. The orbits are normal. CT CERVICAL SPINE FINDINGS Alignment: No static subluxation. Facets are aligned. Occipital condyles are normally positioned. Skull base and vertebrae: No acute fracture. Soft tissues and spinal canal: No prevertebral fluid or swelling. No visible canal hematoma. Disc levels: No advanced spinal canal or neural foraminal stenosis. Upper chest: No pneumothorax, pulmonary nodule or pleural effusion. Other: Heterogeneous and enlarged thyroid gland. IMPRESSION: 1. No acute intracranial abnormality. 2. Chronic ischemic microangiopathy. 3. No acute fracture or static subluxation of the cervical spine. 4. Heterogeneous and enlarged thyroid gland. This has been previously evaluated with ultrasound. Electronically Signed   By: Deatra Robinson M.D.   On: 12/31/2022 01:58    PROCEDURES and INTERVENTIONS:  .1-3 Lead EKG Interpretation  Performed by: Delton Prairie, MD Authorized by: Delton Prairie, MD     Interpretation: normal     ECG rate:  80   ECG rate assessment: normal     Rhythm: sinus rhythm     Ectopy: none     Conduction: normal     Medications  magnesium sulfate IVPB 2 g 50 mL (2 g Intravenous New Bag/Given 12/31/22 0407)  potassium chloride 10 mEq in 100 mL IVPB (10 mEq Intravenous New Bag/Given 12/31/22 0408)  lactated ringers bolus 1,000 mL (1,000 mLs Intravenous New Bag/Given 12/31/22 0406)     IMPRESSION / MDM / ASSESSMENT AND PLAN / ED COURSE  I reviewed the triage vital signs and the nursing  notes.  Differential diagnosis includes, but is not limited to, UTI, sepsis, dehydration, status epilepticus, ICH or stroke  {Patient presents with symptoms of an acute illness or injury that is potentially life-threatening.  72 year old woman returns with another episode of acute encephalopathy, possibly metabolic in etiology and requiring medical admission.  She does have an AKI on her metabolic panel as well as hypokalemia and hypomagnesemia, IV replacement is initiated in the ED as well as a liter of LR.  CT head and neck are reassuring as well as her CXR.  No leukocytosis and I doubt sepsis.  Clear urine.  Medicine consulted for admission  Clinical Course as of 12/31/22 0434  Wed Dec 31, 2022  0221 Called Sister Meryl Dare. Same thing every time she comes to the ER. Don't know what happened. Been confused, can't walk or do nothing. 1-2 days. "It just happens, I don't know."  [DS]  0423 Consult hospitalist who agrees to admit [DS]  Clinical Course User Index [DS] Delton Prairie, MD     FINAL CLINICAL IMPRESSION(S) / ED DIAGNOSES   Final diagnoses:  Acute encephalopathy     Rx / DC Orders   ED Discharge Orders     None        Note:  This document was prepared using Dragon voice recognition software and may include unintentional dictation errors.   Delton Prairie, MD 12/31/22 [phone]

## 2022-12-31 NOTE — ED Notes (Signed)
Writer in room due to pt attempt to get out of bed. Pt bed alarming and bright red blood seen on pts left hand. Loss of left hand IV due to removal by pt. Richard, assigned RN in room. Mittens retrieved and sitter 1:1 order placed with tech at bedside.

## 2022-12-31 NOTE — ED Triage Notes (Signed)
EMS brings pt in from home for fall; seen recently for "possible stroke"; "pill bottles all over the floor and unsteady, pupils constricted"

## 2022-12-31 NOTE — Progress Notes (Signed)
[facility] PLAN OF CARE NOTE Patient: Anne Phillips ZOX:[id number]   PCP: Gillis Santa, MD DOB: 10/25/1950   DOA: 12/31/2022   DOS: 12/31/2022    Patient was admitted by my colleague earlier on 12/31/2022. I have reviewed the H&P as well as assessment and plan and agree with the same. Important changes in the plan are listed below.  Plan of care: Principal Problem:   Altered mental status Remains confused but denies any acute complaints. Resuming home regimen. Holding antihypertensive regimen as the blood pressure was soft after initiation of them. Continue with IV fluid. Monitor for improvement in mentation.  Level of care: Telemetry Medical Continue telemetry due to AKI and hypotension.  Author: Lynden Oxford, MD [facility] 12/31/2022 6:40 PM   If 7PM-7AM, please contact night-coverage at [email or web address]

## 2022-12-31 NOTE — ED Triage Notes (Signed)
Pt arrived via [facility] from home with altered mental status and weakness, pt has slurred speech, opens eyes to voice, pt will answer some questions, pt states she fell out of bed tonight, but denies any head injury.  Pt makes some involuntary movements while in the stretcher, pt unable to hold a conversation in triage.  Per EMS pt has been seen recently for stroke, pt has hx of seizures.

## 2022-12-31 NOTE — ED Notes (Signed)
Patient transported to CT 

## 2022-12-31 NOTE — H&P (Addendum)
History and Physical   [facility] [place] @ [facility] Admission History and Physical [employer], D.O.    Patient Name: Anne Phillips MR#: [id number] Date of Birth: 1951/01/15 Date of Admission: 12/31/2022  Referring MD/NP/PA: Dr. Katrinka Blazing Primary Care Physician: Gillis Santa, MD  Chief Complaint:  Chief Complaint  Patient presents with   Weakness   Altered Mental Status   Please note the entire history is obtained from the patient's emergency department chart, emergency department provider. history is limited by altered mental status.  History obtained from family member by emergency department physician  HPI: Anne Phillips is a 72 y.o. female with a known history of breast cancer, coronary artery disease, hypertension, hyperlipidemia, seizure disorder, hyperthyroidism presents to the emergency department for evaluation of altered mental status.  Patient was in a usual state of health until per patient's sister by report patient became altered over the past 1 to 2 days, difficulty walking and not eating.  When I visited with the patient she is unable to answer any questions just moaning.   Of note patient was admitted in March 24 for encephalopathy associated with hyponatremia.  Also of note patient sustained a fall in the emergency department which is why a second head CT was done  EMS/ED Course: Patient received lactated Ringer's, magnesium, potassium. Medical admission has been requested for further management of altered mental status, AKI.  Review of Systems:  Unable to obtain secondary to altered mental status   Past Medical History:  Diagnosis Date   Cancer [facility])    Coronary artery disease    Hyperlipidemia    Hypertension    Seizures (HCC)    Slurred speech 10/11/2022   Thyroid disease     Past Surgical History:  Procedure Laterality Date   MASTECTOMY Left      reports that she has been smoking cigarettes. She has been smoking an average of 1 pack  per day. She has never used smokeless tobacco. She reports that she does not drink alcohol and does not use drugs.  Allergies  Allergen Reactions   Iodinated Contrast Media Hives and Other (See Comments)    Rash  Other Reaction(s): Other (See Comments)  Rash   Rash   Rash    Rash  Rash   Other Hives    Metal-reaction=rash Metal-reaction=rash     Family History  Problem Relation Age of Onset   Hypertension Other     Prior to Admission medications   Medication Sig Start Date End Date Taking? Authorizing Provider  acetaminophen (TYLENOL) 325 MG tablet Take 2 tablets (650 mg total) by mouth every 6 (six) hours as needed for mild pain (or Fever >/= 101). 10/05/20   Zannie Cove, MD  amLODipine (NORVASC) 5 MG tablet Take 1 tablet (5 mg total) by mouth every evening. Skip the dose if systolic BP less than 140 mmHg 11/06/22 02/04/23  Gillis Santa, MD  aspirin EC 81 MG tablet Take 1 tablet (81 mg total) by mouth daily. Swallow whole. 10/18/22   Delfino Lovett, MD  atorvastatin (LIPITOR) 40 MG tablet Take 40 mg by mouth daily.    [provider]  carvedilol (COREG) 12.5 MG tablet Take 1 tablet (12.5 mg total) by mouth 2 (two) times daily with a meal. 11/06/22 02/04/23  Gillis Santa, MD  clonazePAM (KLONOPIN) 1 MG tablet Take 1 tablet (1 mg total) by mouth 2 (two) times daily. 11/06/22 02/04/23  Gillis Santa, MD  empagliflozin (JARDIANCE) 10 MG TABS tablet Take 10  mg by mouth daily.    [provider]  feeding supplement (ENSURE ENLIVE / ENSURE PLUS) LIQD Take 237 mLs by mouth 2 (two) times daily between meals. 10/17/22   Delfino Lovett, MD  folic acid (FOLVITE) 1 MG tablet Take 1 tablet (1 mg total) by mouth daily. 11/07/22 02/05/23  Gillis Santa, MD  gabapentin (NEURONTIN) 300 MG capsule Take 1 capsule (300 mg total) by mouth 2 (two) times daily. 10/05/20   Zannie Cove, MD  hydrALAZINE (APRESOLINE) 50 MG tablet Take 1 tablet (50 mg total) by mouth every 8 (eight) hours as needed (SBP  >150). 11/06/22   Gillis Santa, MD  Lacosamide (VIMPAT) 150 MG TABS Take 1 tablet (150 mg total) by mouth in the morning and at bedtime. 08/06/21   Marrion Coy, MD  levETIRAcetam (KEPPRA) 250 MG tablet Take 1 tablet (250 mg total) by mouth at bedtime. 11/06/22 11/06/23  Gillis Santa, MD  levETIRAcetam (KEPPRA) 500 MG tablet Take 1 tablet (500 mg total) by mouth 3 (three) times daily. 11/06/22 11/06/23  Gillis Santa, MD  losartan (COZAAR) 100 MG tablet Take 100 mg by mouth daily. 08/19/22   [provider]  methimazole (TAPAZOLE) 5 MG tablet Take 1 tablet (5 mg total) by mouth daily. 11/07/22 11/07/23  Gillis Santa, MD  Multiple Vitamin (MULTIVITAMIN WITH MINERALS) TABS tablet Take 1 tablet by mouth daily. 10/18/22   Delfino Lovett, MD  nicotine (NICODERM CQ - DOSED IN MG/24 HOURS) 14 mg/24hr patch Place 1 patch (14 mg total) onto the skin daily. 10/18/22   Delfino Lovett, MD  nitroGLYCERIN (NITROSTAT) 0.4 MG SL tablet Place under the tongue. 05/26/22   [provider]  pantoprazole (PROTONIX) 40 MG tablet Take 1 tablet (40 mg total) by mouth daily. 11/06/22 11/06/23  Gillis Santa, MD  tacrolimus (PROTOPIC) 0.1 % ointment Apply 1 Application topically daily as needed. 07/29/22   [provider]  vitamin B-12 1000 MCG tablet Take 1 tablet (1,000 mcg total) by mouth daily. 08/06/21   Marrion Coy, MD  Vitamin D, Ergocalciferol, (DRISDOL) 1.25 MG (50000 UNIT) CAPS capsule Take 1 capsule (50,000 Units total) by mouth every 7 (seven) days. 11/12/22 02/10/23  Gillis Santa, MD    Physical Exam: Vitals:   12/31/22 0219 12/31/22 0245 12/31/22 0300 12/31/22 0420  BP: (!) 168/89 (!) 180/115 (!) 180/91 (!) 144/77  Pulse: 72 83 87 85  Resp: 19 (!) 21  17  Temp: 98.4 F (36.9 C)   98.5 F (36.9 C)  TempSrc: Oral   Oral  SpO2: 100% 97% 97% 98%    GENERAL: 72 y.o.-year-old female patient, chronically ill-appearing, disheveled, lying on her side in bed moaning. HEENT: Head atraumatic, normocephalic.  Pupils equal. Mucus membranes dry NECK: Supple. No JVD. CHEST: Normal breath sounds bilaterally. No wheezing, rales, rhonchi or crackles. No use of accessory muscles of respiration.  No reproducible chest wall tenderness.  CARDIOVASCULAR: S1, S2 normal. No murmurs, rubs, or gallops. Cap refill <2 seconds. Pulses intact distally.  ABDOMEN: Soft, nondistended, nontender. No rebound, guarding, rigidity. Normoactive bowel sounds present in all four quadrants.  EXTREMITIES: No pedal edema, cyanosis, or clubbing. No calf tenderness or Homan's sign.  NEUROLOGIC: Patient arouses to painful stimuli and touch.  She responds to voice by moaning but is unable to comply with exam or follow commands    Labs on Admission:  CBC: Recent Labs  Lab 12/31/22 0150  WBC 7.7  HGB 12.1  HCT 38.3  MCV 87.6  PLT 288  Basic Metabolic Panel: Recent Labs  Lab 12/31/22 0149 12/31/22 0150  NA  --  139  K  --  3.0*  CL  --  106  CO2  --  21*  GLUCOSE  --  100*  BUN  --  24*  CREATININE  --  1.79*  CALCIUM  --  8.7*  MG 1.3*  --    GFR: CrCl cannot be calculated (Unknown ideal weight.). Liver Function Tests: Recent Labs  Lab 12/31/22 0150  AST 49*  ALT 31  ALKPHOS 114  BILITOT 0.7  PROT 7.9  ALBUMIN 3.8   No results for input(s): "LIPASE", "AMYLASE" in the last 168 hours. No results for input(s): "AMMONIA" in the last 168 hours. Coagulation Profile: No results for input(s): "INR", "PROTIME" in the last 168 hours. Cardiac Enzymes: No results for input(s): "CKTOTAL", "CKMB", "CKMBINDEX", "TROPONINI" in the last 168 hours. BNP (last 3 results) No results for input(s): "PROBNP" in the last 8760 hours. HbA1C: No results for input(s): "HGBA1C" in the last 72 hours. CBG: No results for input(s): "GLUCAP" in the last 168 hours. Lipid Profile: No results for input(s): "CHOL", "HDL", "LDLCALC", "TRIG", "CHOLHDL", "LDLDIRECT" in the last 72 hours. Thyroid Function Tests: No results for input(s):  "TSH", "T4TOTAL", "FREET4", "T3FREE", "THYROIDAB" in the last 72 hours. Anemia Panel: No results for input(s): "VITAMINB12", "FOLATE", "FERRITIN", "TIBC", "IRON", "RETICCTPCT" in the last 72 hours. Urine analysis:    Component Value Date/Time   COLORURINE STRAW (A) 12/31/2022 0255   APPEARANCEUR CLEAR (A) 12/31/2022 0255   LABSPEC 1.008 12/31/2022 0255   PHURINE 5.0 12/31/2022 0255   GLUCOSEU NEGATIVE 12/31/2022 0255   HGBUR SMALL (A) 12/31/2022 0255   BILIRUBINUR NEGATIVE 12/31/2022 0255   KETONESUR NEGATIVE 12/31/2022 0255   PROTEINUR NEGATIVE 12/31/2022 0255   NITRITE NEGATIVE 12/31/2022 0255   LEUKOCYTESUR NEGATIVE 12/31/2022 0255   Sepsis Labs: @LABRCNTIP (procalcitonin:4,lacticidven:4) )No results found for this or any previous visit (from the past 240 hour(s)).   Radiological Exams on Admission: CT HEAD WO CONTRAST ( )  Result Date: 12/31/2022 CLINICAL DATA:  Larey Seat out of bed EXAM: CT HEAD WITHOUT CONTRAST TECHNIQUE: Contiguous axial images were obtained from the base of the skull through the vertex without intravenous contrast. RADIATION DOSE REDUCTION: This exam was performed according to the departmental dose-optimization program which includes automated exposure control, adjustment of the mA and/or kV according to patient size and/or use of iterative reconstruction technique. COMPARISON:  CT brain 12/31/2022 FINDINGS: Brain: No acute territorial infarction, hemorrhage or intracranial mass. Mild chronic small vessel ischemic changes of the white matter. Mild atrophy. Stable ventricle size Vascular: No hyperdense vessels.  Carotid vascular calcification Skull: Normal. Negative for fracture or focal lesion. Sinuses/Orbits: No acute finding. Other: None IMPRESSION: 1. No CT evidence for acute intracranial abnormality. 2. Atrophy and chronic small vessel ischemic changes of the white matter. Electronically Signed   By: Jasmine Pang M.D.   On: 12/31/2022 02:59   DG Chest Portable 1  View  Result Date: 12/31/2022 CLINICAL DATA:  Altered mental status and weakness EXAM: PORTABLE CHEST 1 VIEW COMPARISON:  10/28/2022 FINDINGS: Cardiac shadow is mildly prominent but stable from the prior study. Aortic calcifications are seen. Previously seen distal right clavicular fracture is not well appreciated on this exam. The lungs are clear. No acute bony abnormality is noted. IMPRESSION: No acute abnormality noted. Electronically Signed   By: Alcide Clever M.D.   On: 12/31/2022 02:15   CT HEAD WO CONTRAST  Result Date: 12/31/2022 CLINICAL  DATA:  Trauma EXAM: CT HEAD WITHOUT CONTRAST CT CERVICAL SPINE WITHOUT CONTRAST TECHNIQUE: Multidetector CT imaging of the head and cervical spine was performed following the standard protocol without intravenous contrast. Multiplanar CT image reconstructions of the cervical spine were also generated. RADIATION DOSE REDUCTION: This exam was performed according to the departmental dose-optimization program which includes automated exposure control, adjustment of the mA and/or kV according to patient size and/or use of iterative reconstruction technique. COMPARISON:  None Available. FINDINGS: CT HEAD FINDINGS Brain: There is no mass, hemorrhage or extra-axial collection. The size and configuration of the ventricles and extra-axial CSF spaces are normal. There is hypoattenuation of the periventricular white matter, most commonly indicating chronic ischemic microangiopathy. Vascular: Atherosclerotic calcification of the vertebral and internal carotid arteries at the skull base. No abnormal hyperdensity of the major intracranial arteries or dural venous sinuses. Skull: The visualized skull base, calvarium and extracranial soft tissues are normal. Sinuses/Orbits: No fluid levels or advanced mucosal thickening of the visualized paranasal sinuses. No mastoid or middle ear effusion. The orbits are normal. CT CERVICAL SPINE FINDINGS Alignment: No static subluxation. Facets are  aligned. Occipital condyles are normally positioned. Skull base and vertebrae: No acute fracture. Soft tissues and spinal canal: No prevertebral fluid or swelling. No visible canal hematoma. Disc levels: No advanced spinal canal or neural foraminal stenosis. Upper chest: No pneumothorax, pulmonary nodule or pleural effusion. Other: Heterogeneous and enlarged thyroid gland. IMPRESSION: 1. No acute intracranial abnormality. 2. Chronic ischemic microangiopathy. 3. No acute fracture or static subluxation of the cervical spine. 4. Heterogeneous and enlarged thyroid gland. This has been previously evaluated with ultrasound. Electronically Signed   By: Deatra Robinson M.D.   On: 12/31/2022 01:58   CT Cervical Spine Wo Contrast  Result Date: 12/31/2022 CLINICAL DATA:  Trauma EXAM: CT HEAD WITHOUT CONTRAST CT CERVICAL SPINE WITHOUT CONTRAST TECHNIQUE: Multidetector CT imaging of the head and cervical spine was performed following the standard protocol without intravenous contrast. Multiplanar CT image reconstructions of the cervical spine were also generated. RADIATION DOSE REDUCTION: This exam was performed according to the departmental dose-optimization program which includes automated exposure control, adjustment of the mA and/or kV according to patient size and/or use of iterative reconstruction technique. COMPARISON:  None Available. FINDINGS: CT HEAD FINDINGS Brain: There is no mass, hemorrhage or extra-axial collection. The size and configuration of the ventricles and extra-axial CSF spaces are normal. There is hypoattenuation of the periventricular white matter, most commonly indicating chronic ischemic microangiopathy. Vascular: Atherosclerotic calcification of the vertebral and internal carotid arteries at the skull base. No abnormal hyperdensity of the major intracranial arteries or dural venous sinuses. Skull: The visualized skull base, calvarium and extracranial soft tissues are normal. Sinuses/Orbits: No fluid  levels or advanced mucosal thickening of the visualized paranasal sinuses. No mastoid or middle ear effusion. The orbits are normal. CT CERVICAL SPINE FINDINGS Alignment: No static subluxation. Facets are aligned. Occipital condyles are normally positioned. Skull base and vertebrae: No acute fracture. Soft tissues and spinal canal: No prevertebral fluid or swelling. No visible canal hematoma. Disc levels: No advanced spinal canal or neural foraminal stenosis. Upper chest: No pneumothorax, pulmonary nodule or pleural effusion. Other: Heterogeneous and enlarged thyroid gland. IMPRESSION: 1. No acute intracranial abnormality. 2. Chronic ischemic microangiopathy. 3. No acute fracture or static subluxation of the cervical spine. 4. Heterogeneous and enlarged thyroid gland. This has been previously evaluated with ultrasound. Electronically Signed   By: Deatra Robinson M.D.   On: 12/31/2022 01:58  EKG: Normal sinus rhythm at 69 bpm with leftward axis and nonspecific ST-T wave changes.   Assessment/Plan  This is a 72 y.o. female with a history of breast cancer, coronary artery disease, hypertension, hyperlipidemia, seizure disorder, hyperthyroidism  now being admitted with:  #.  Acute encephalopathy likely metabolic given history.  No evidence of seizure activity - Admit inpatient telemetry - Fall precautions - Neurochecks -Check urine drug screen and alcohol level -Aspiration precautions Will consult physical therapy for consideration of placement and safe discharge planning  #.  Electrolyte derangement, hypokalemia, hypomagnesemia - Replace orally  #. Acute kidney injury  - IV fluids and repeat BMP in AM.  - Avoid nephrotoxic medications - Bladder scan and place foley catheter if evidence of urinary retention  #. History of hypertension - Continue amlodipine, carvedilol, hydralazine, losartan  #. History of hyperlipidemia - Continue atorvastatin  #. History of hypothyroidism - Continue  methimazole - Check TSH   #. History of seizure disorder - Continue levetiracetam lacosamide gabapentin - Seizure precautions  #. History of GERD - Continue Protonix  #.  History of diabetes - Hold Jardiance - Accu-Cheks ACHS with insulin sliding scale - Check A1c  Admission status: Inpatient, telemetry IV Fluids: Normal saline Diet/Nutrition: Heart healthy, carb controlled Consults called: PT DVT Px: Lovenox, SCDs and early ambulation. Code Status: Full Code  Disposition Plan: To home in to be determined  All the records are reviewed and case discussed with ED provider. Management plans discussed with the patient and/or family who express understanding and agree with plan of care.  [employer] D.O. on 12/31/2022 at 4:37 AM CC: Primary care physician; Gillis Santa, MD   12/31/2022, 4:37 AM

## 2022-12-31 NOTE — ED Notes (Signed)
Floor pads placed at the foot of the patient bed.  Seizure pads placed on the right side rail of the bed to protect the patient's head.

## 2023-01-01 ENCOUNTER — Inpatient Hospital Stay: Payer: Medicare HMO

## 2023-01-01 DIAGNOSIS — R4182 Altered mental status, unspecified: Secondary | ICD-10-CM | POA: Diagnosis not present

## 2023-01-01 DIAGNOSIS — R569 Unspecified convulsions: Secondary | ICD-10-CM | POA: Diagnosis not present

## 2023-01-01 DIAGNOSIS — G9340 Encephalopathy, unspecified: Secondary | ICD-10-CM | POA: Diagnosis not present

## 2023-01-01 LAB — CBC
HCT: 29.6 % — ABNORMAL LOW (ref 36.0–46.0)
Hemoglobin: 9.8 g/dL — ABNORMAL LOW (ref 12.0–15.0)
MCH: 28.6 pg (ref 26.0–34.0)
MCHC: 33.1 g/dL (ref 30.0–36.0)
MCV: 86.3 fL (ref 80.0–100.0)
Platelets: 228 10*3/uL (ref 150–400)
RBC: 3.43 MIL/uL — ABNORMAL LOW (ref 3.87–5.11)
RDW: 16 % — ABNORMAL HIGH (ref 11.5–15.5)
WBC: 8.6 10*3/uL (ref 4.0–10.5)
nRBC: 0 % (ref 0.0–0.2)

## 2023-01-01 LAB — BASIC METABOLIC PANEL
Anion gap: 9 (ref 5–15)
BUN: 18 mg/dL (ref 8–23)
CO2: 18 mmol/L — ABNORMAL LOW (ref 22–32)
Calcium: 8.5 mg/dL — ABNORMAL LOW (ref 8.9–10.3)
Chloride: 110 mmol/L (ref 98–111)
Creatinine, Ser: 1.26 mg/dL — ABNORMAL HIGH (ref 0.44–1.00)
GFR, Estimated: 46 mL/min — ABNORMAL LOW (ref >60)
Glucose, Bld: 131 mg/dL — ABNORMAL HIGH (ref 70–99)
Potassium: 4 mmol/L (ref 3.5–5.1)
Sodium: 137 mmol/L (ref 135–145)

## 2023-01-01 LAB — MAGNESIUM: Magnesium: 1.6 mg/dL — ABNORMAL LOW (ref 1.7–2.4)

## 2023-01-01 LAB — GLUCOSE, CAPILLARY
Glucose-Capillary: 118 mg/dL — ABNORMAL HIGH (ref 70–99)
Glucose-Capillary: 119 mg/dL — ABNORMAL HIGH (ref 70–99)
Glucose-Capillary: 101 mg/dL — ABNORMAL HIGH (ref 70–99)
Glucose-Capillary: 108 mg/dL — ABNORMAL HIGH (ref 70–99)

## 2023-01-01 MED ORDER — GLUCERNA SHAKE PO LIQD
237.0000 mL | Freq: Three times a day (TID) | ORAL | Status: DC
  Administered 2023-01-01 – 2023-01-06 (×6): 237 mL via ORAL

## 2023-01-01 MED ORDER — TRIAMCINOLONE 0.1 % CREAM:EUCERIN CREAM 1:1
TOPICAL_CREAM | Freq: Three times a day (TID) | CUTANEOUS | Status: DC
  Administered 2023-01-04 – 2023-01-05 (×3): 1 via TOPICAL
  Filled 2023-01-01: qty 1

## 2023-01-01 MED ORDER — ENSURE ENLIVE PO LIQD: 237.0000 mL | Freq: Three times a day (TID) | ORAL | Status: DC

## 2023-01-01 NOTE — Progress Notes (Signed)
Eeg done 

## 2023-01-01 NOTE — Hospital Course (Addendum)
PMH of CAD, breast cancer, HTN, HLD, seizures, hypothyroidism presented to hospital with complaints of confusion. slurred speech, opens eyes to voice, pt will answer some questions, pt states she fell out of bed tonight, but denies any head injury. Pt makes some involuntary movements while in the stretcher, pt unable to hold a conversation in triage.  Workup shows AKI treated with IV fluid.

## 2023-01-01 NOTE — TOC Progression Note (Signed)
Transition of Care [facility]) - Progression Note    Patient Details  Name: Anne Phillips MRN: [id number] Date of Birth: [birth year]  [facility] CM/SW Contact  Marlowe Sax, RN Phone Number: 01/01/2023, 3:02 PM  Clinical Narrative:     Patient lives alone, her sisiter helps her with IADLs, Fl2 completed, PASSR obtained, Per patient ping she is ipen with [facility], Bedsearch sent       Expected Discharge Plan and Services                                               Social Determinants of Health (SDOH) Interventions SDOH Screenings   Food Insecurity: Patient Unable To Answer (12/31/2022)  Housing: Low Risk  (10/28/2022)  Transportation Needs: Patient Unable To Answer (12/31/2022)  Utilities: Patient Unable To Answer (12/31/2022)  Tobacco Use: High Risk (10/28/2022)    Readmission Risk Interventions    10/12/2022   11:51 AM  Readmission Risk Prevention Plan  PCP or Specialist Appt within 3-5 Days Complete  Social Work Consult for Recovery Care Planning/Counseling Complete  Palliative Care Screening Not Applicable
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
## 2023-01-01 NOTE — Plan of Care (Signed)

## 2023-01-01 NOTE — Plan of Care (Signed)
Patient A&Ox1, from home, up with 2 assist in room, using bedside commode. Patient has been very restless with continued attempts to get out of bed because she "has to pee." Patient was bladder scanned with 37 mL residual. Patient also picking at IV site and tele box. Patient had multiple attempts getting out of bed this shift. Patient frequently redirected.

## 2023-01-01 NOTE — Evaluation (Signed)
Occupational Therapy Evaluation Patient Details Name: Anne Phillips MRN: [id number] DOB: [birth year] Today's Date: 01/01/2023   History of Present Illness Pt is a 72 y.o. female with a known history of breast cancer, coronary artery disease, hypertension, hyperlipidemia, seizure disorder, hyperthyroidism presents to the emergency department for evaluation of altered mental status. Recent admission on 11/23/2022, noted for Comminuted, displaced and overriding fracture of the distal 3rd of the right clavicular shaft.   Clinical Impression   Patient received for OT evaluation. See flowsheet below for details of function. Limited assessment today, as pt only willing to engage in eating (MIN A-set up) before returning to sleeping today. Noted to have strength, ROM, and coordination deficits in UE. Per PT note yesterday, MAX A x2 to t/f to EOB.  Patient will benefit from continued OT while in acute care.      Recommendations for follow up therapy are one component of a multi-disciplinary discharge planning process, led by the attending physician.  Recommendations may be updated based on patient status, additional functional criteria and insurance authorization.   Assistance Recommended at Discharge Frequent or constant Supervision/Assistance  Patient can return home with the following Two people to help with walking and/or transfers;A lot of help with bathing/dressing/bathroom;Assistance with cooking/housework;Assistance with feeding;Direct supervision/assist for medications management;Direct supervision/assist for financial management;Assist for transportation;Help with stairs or ramp for entrance    Functional Status Assessment  Patient has had a recent decline in their functional status and demonstrates the ability to make significant improvements in function in a reasonable and predictable amount of time.  Equipment Recommendations  Other (comment) (defer to next venue of care)     Recommendations for Other Services       Precautions / Restrictions Precautions Precautions: Fall Restrictions Weight Bearing Restrictions: No      Mobility Bed Mobility               General bed mobility comments: Not tested today; pt unwilling to try; with PT yesterday pt was MA XA x2.    Transfers                   General transfer comment: Unable to attempt today      Balance                                           ADL either performed or assessed with clinical judgement   ADL Overall ADL's : Needs assistance/impaired Eating/Feeding: Minimal assistance;Bed level Eating/Feeding Details (indicate cue type and reason): Pt with decreased coordination, decreased pronation/supination, having difficulty spearing food with fork; grip strength functional for holding small container of juice/milk and silverware. Pt using BIL UE (often R hand for silverware and L hand for juice). OT assistance for stabilizing food for pt to spear pancake or sausage piece, opening containers, preparing food (cutting into bite-sized pieces).   Grooming Details (indicate cue type and reason): OT attempted to engage pt in grooming after eating breakfast, but pt pulled covers over torso and closed eyes.                               General ADL Comments: Pt declining further ADLs today besides eating. Anticipate significant assistance with ADLs at this time given that yesterday pt was MAX A x2 with PT to sit EOB.  Vision         Perception     Praxis      Pertinent Vitals/Pain Pain Assessment Pain Assessment: No/denies pain     Hand Dominance Right   Extremity/Trunk Assessment Upper Extremity Assessment Upper Extremity Assessment: Generalized weakness (pt appearing to have decreased coordination with eating, but was able to use silverware with some mistakes.)   Lower Extremity Assessment Lower Extremity Assessment: Defer to PT  evaluation       Communication Communication Communication: Other (comment) (difficult enunciating words; poor dentition.)   Cognition Arousal/Alertness: Lethargic Behavior During Therapy: Flat affect Overall Cognitive Status: No family/caregiver present to determine baseline cognitive functioning                                 General Comments: Pt able to state that she is in "the hospital"; doesn't know which one. Pt slightly difficult to understand at times (appears to have poor dentition).     General Comments  OT raised HOB and gave verbal and tactile cue to awaken pt this morning; once awake, pt alert and engaged in eating breakfast task. However, unwilling to perform AROM or strength testing; returning to under covers and sleeping.    Exercises     Shoulder Instructions      Home Living Family/patient expects to be discharged to:: Private residence Living Arrangements: Alone Available Help at Discharge: Family (sister) Type of Home: House Home Access: Stairs to enter [employer] of Steps: 3-4 Entrance Stairs-Rails: Left Home Layout: One level     Bathroom Shower/Tub: Tub/shower unit;Curtain   Bathroom Toilet: Standard         Additional Comments: Patient limited historian; information from previous documentation. Per medical record, pt has hx of SNF rehab, but typically lives alone; sister nearby.      Prior Functioning/Environment Prior Level of Function : Needs assist             Mobility Comments: per chart pt intermittently used RW for ambulation ADLs Comments: Unknown; pt unable to state        OT Problem List: Decreased strength;Decreased activity tolerance;Impaired balance (sitting and/or standing);Decreased coordination;Decreased cognition;Decreased safety awareness      OT Treatment/Interventions: Self-care/ADL training;Therapeutic exercise;Therapeutic activities    OT Goals(Current goals can be found in the care  plan section) Acute Rehab OT Goals Patient Stated Goal: None stated OT Goal Formulation: Patient unable to participate in goal setting Time For Goal Achievement: 01/15/23 Potential to Achieve Goals: Fair ADL Goals Pt Will Perform Grooming: with modified independence;standing Pt Will Perform Lower Body Bathing: with modified independence;sit to/from stand Pt Will Perform Lower Body Dressing: with modified independence;sit to/from stand Pt Will Transfer to Toilet: with modified independence;bedside commode;ambulating Pt Will Perform Toileting - Clothing Manipulation and hygiene: with modified independence;sit to/from stand Pt Will Perform Tub/Shower Transfer: with modified independence;tub bench;ambulating  OT Frequency: Min 1X/week    Co-evaluation              AM-PAC OT "6 Clicks" Daily Activity     Outcome Measure Help from another person eating meals?: A Little Help from another person taking care of personal grooming?: A Lot Help from another person toileting, which includes using toliet, bedpan, or urinal?: Total Help from another person bathing (including washing, rinsing, drying)?: A Lot Help from another person to put on and taking off regular upper body clothing?: A Lot Help from another person to  put on and taking off regular lower body clothing?: Total 6 Click Score: 11   End of Session Nurse Communication: Other (comment) (amount of breakfast eaten and level of assistance with breakfast.)  Activity Tolerance: Patient limited by fatigue;Other (comment) (limited by cognitive deficits) Patient left: in bed;with bed alarm set;with call bell/phone within reach  OT Visit Diagnosis: Other symptoms and signs involving cognitive function                Time: 8657-8469 OT Time Calculation (min): 21 min Charges:  OT General Charges $OT Visit: 1 Visit OT Evaluation $OT Eval Moderate Complexity: 1 Mod  Kyler Lerette Junie Panning, MS, OTR/L  Alvester Morin 01/01/2023, 11:58 AM

## 2023-01-01 NOTE — Progress Notes (Signed)
Foust,NP was notified due to patients inability to urinate. Bladder scanned patient a volume of 380 was noted. In and out patient at 2250 725 cc of urine was removed. It was also noted that patients slurred speech seemed to be worsening. NIH was ordered patient scored a 2. NP came to bedside to assess patient. NP ordered to just monitor patient for any neurological changes.   Notified Foust, NP at [phone] that patient was bladder scanned and a volume of greater than 310cc was recorded. NP, ordered to in and out patient again.

## 2023-01-01 NOTE — Procedures (Signed)
Patient Name: Anne Phillips  MRN: [id number]  Epilepsy Attending: Charlsie Quest  Referring Physician/Provider: Rolly Salter, MD  Date: 12/02/2022 Duration: 31.43 mins  Patient history: 72yo F with ams getting eeg to evaluate for seizure  Level of alertness: Awake, asleep  AEDs during EEG study: LEV, LCM  Technical aspects: This EEG study was done with scalp electrodes positioned according to the 10-20 International system of electrode placement. Electrical activity was reviewed with band pass filter of 1-70Hz , sensitivity of 7 uV/mm, display speed of 54mm/sec with a 60Hz  notched filter applied as appropriate. EEG data were recorded continuously and digitally stored.  Video monitoring was available and reviewed as appropriate.  Description: The posterior dominant rhythm consists of 8 Hz activity of moderate voltage (25-35 uV) seen predominantly in posterior head regions, symmetric and reactive to eye opening and eye closing. Sleep was characterized by vertex waves, sleep spindles (12 to 14 Hz), maximal frontocentral region. EEG showed intermittent generalized polymorphic 3 to 6 Hz theta-delta slowing. Hyperventilation and photic stimulation were not performed.     ABNORMALITY - Intermittent slow, generalized  IMPRESSION: This study is suggestive of moderate diffuse encephalopathy. No seizures or epileptiform discharges were seen throughout the recording.  Hulet Ehrmann Annabelle Harman

## 2023-01-01 NOTE — Progress Notes (Signed)
SLP Cancellation Note  Patient Details Name: Anne Phillips MRN: [id number] DOB: Jan 15, 1951   Cancelled treatment:       Reason Eval/Treat Not Completed: Patient declined, no reason specified   SLP consult received and appreciated. Chart review completed. Attempted clinical swallowing evaluation, pt declined despite encouragement stating "I don't want anything." Per RN, pt tolerated meds whole with puree and thin liquids this AM. Will continue efforts as appropriate  Anne Phillips, M.S., CCC-SLP Speech-Language Pathologist [facility] [phone] Arnette Felts)  Woodroe Chen 01/01/2023, 11:48 AM

## 2023-01-01 NOTE — Progress Notes (Signed)
[facility] Progress Note Patient: Anne Phillips:[id number] DOB: 1951/01/19 DOA: 12/31/2022  DOS: the patient was seen and examined on 01/01/2023  Brief hospital course: PMH of CAD, breast cancer, HTN, HLD, seizures, hypothyroidism presented to hospital with complaints of confusion.  Found to have AKI treated with IV fluids. Assessment and Plan: AKI. Hypokalemia. Hypomagnesemia. Currently being replaced and monitored. Renal function improving but also will continue IV fluids as it is still not back to baseline.  Acute metabolic encephalopathy. In the setting of AKI. Monitor. So far no evidence of focal deficit to indicate stroke or seizures.  History of seizure disorder. Continue home regimen of gabapentin, Keppra, lacosamide.  Hypothyroidism. Holding methimazole.  HLD. Continue statin.  HTN. Blood pressure soft.  Currently holding home regimen.  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 Currently on sliding scale insulin.   Subjective: Noncooperative during examination.  Not answering questions consistently.  Denies any acute complaint.  With anxious that her right leg is hurting.  Physical Exam: Clear to auscultation. S1-S2 present. Bowel sound present. Right lower extremity has some dry skin. No significant edema. No focal deficit.  Data Reviewed: I have Reviewed nursing notes, Vitals, and Lab results. Since last encounter, pertinent lab results CBC and BMP   . I have ordered test including CBC and BMP  .   Disposition: Status is: Inpatient Remains inpatient appropriate because: Need for IV fluids.  SCDs Start: 12/31/22 0832   Family Communication: No one at bedside Level of care: Telemetry Medical   Vitals:   01/01/23 0149 01/01/23 0539 01/01/23 0743 01/01/23 1445  BP: (!) 176/87 (!) 164/82 138/78 (!) 163/84  Pulse: 83 73 82 71  Resp: 15 16 17 16   Temp: 97.8 F  (36.6 C) 98.5 F (36.9 C) 98.3 F (36.8 C) 98.2 F (36.8 C)  TempSrc: Axillary Axillary    SpO2: 100% 98% 100% 98%     Author: Lynden [place], MD 01/01/2023 6:21 PM  Please look on [email or web address] to find out who is on call.

## 2023-01-02 ENCOUNTER — Inpatient Hospital Stay: Payer: Medicare HMO

## 2023-01-02 DIAGNOSIS — G9340 Encephalopathy, unspecified: Secondary | ICD-10-CM | POA: Diagnosis not present

## 2023-01-02 LAB — GLUCOSE, CAPILLARY
Glucose-Capillary: 105 mg/dL — ABNORMAL HIGH (ref 70–99)
Glucose-Capillary: 108 mg/dL — ABNORMAL HIGH (ref 70–99)
Glucose-Capillary: 91 mg/dL (ref 70–99)
Glucose-Capillary: 92 mg/dL (ref 70–99)
Glucose-Capillary: 94 mg/dL (ref 70–99)

## 2023-01-02 LAB — BASIC METABOLIC PANEL
Anion gap: 7 (ref 5–15)
BUN: 15 mg/dL (ref 8–23)
CO2: 21 mmol/L — ABNORMAL LOW (ref 22–32)
Calcium: 8.6 mg/dL — ABNORMAL LOW (ref 8.9–10.3)
Chloride: 110 mmol/L (ref 98–111)
Creatinine, Ser: 1.07 mg/dL — ABNORMAL HIGH (ref 0.44–1.00)
GFR, Estimated: 56 mL/min — ABNORMAL LOW (ref >60)
Glucose, Bld: 97 mg/dL (ref 70–99)
Potassium: 3.9 mmol/L (ref 3.5–5.1)
Sodium: 138 mmol/L (ref 135–145)

## 2023-01-02 LAB — MAGNESIUM: Magnesium: 1.6 mg/dL — ABNORMAL LOW (ref 1.7–2.4)

## 2023-01-02 MED ORDER — PHENAZOPYRIDINE HCL 100 MG PO TABS
100.0000 mg | ORAL_TABLET | Freq: Three times a day (TID) | ORAL | Status: AC
  Administered 2023-01-02 – 2023-01-05 (×8): 100 mg via ORAL
  Filled 2023-01-02 (×9): qty 1

## 2023-01-02 MED ORDER — MAGNESIUM SULFATE 2 GM/50ML IV SOLN
2.0000 g | Freq: Once | INTRAVENOUS | Status: AC
  Administered 2023-01-02: 2 g via INTRAVENOUS
  Filled 2023-01-02: qty 50

## 2023-01-02 MED ORDER — GABAPENTIN 300 MG PO CAPS
300.0000 mg | ORAL_CAPSULE | Freq: Every day | ORAL | Status: DC
  Administered 2023-01-02 – 2023-01-05 (×4): 300 mg via ORAL
  Filled 2023-01-02 (×4): qty 1

## 2023-01-02 MED ORDER — AMLODIPINE BESYLATE 5 MG PO TABS
5.0000 mg | ORAL_TABLET | Freq: Every evening | ORAL | Status: DC
  Administered 2023-01-02 – 2023-01-06 (×5): 5 mg via ORAL
  Filled 2023-01-02 (×5): qty 1

## 2023-01-02 MED ORDER — CLONAZEPAM 1 MG PO TABS
1.0000 mg | ORAL_TABLET | Freq: Every day | ORAL | Status: DC
  Administered 2023-01-02 – 2023-01-05 (×4): 1 mg via ORAL
  Filled 2023-01-02 (×4): qty 1

## 2023-01-02 MED ORDER — CARVEDILOL 3.125 MG PO TABS
6.2500 mg | ORAL_TABLET | Freq: Two times a day (BID) | ORAL | Status: DC
  Administered 2023-01-02 – 2023-01-06 (×9): 6.25 mg via ORAL
  Filled 2023-01-02 (×9): qty 2

## 2023-01-02 MED ORDER — METHIMAZOLE 5 MG PO TABS
5.0000 mg | ORAL_TABLET | Freq: Every day | ORAL | Status: DC
  Administered 2023-01-02: 5 mg via ORAL
  Filled 2023-01-02: qty 1

## 2023-01-02 MED ORDER — MAGNESIUM OXIDE -MG SUPPLEMENT 400 (240 MG) MG PO TABS
800.0000 mg | ORAL_TABLET | Freq: Every day | ORAL | Status: DC
  Administered 2023-01-03 – 2023-01-06 (×4): 800 mg via ORAL
  Filled 2023-01-02 (×4): qty 2

## 2023-01-02 MED ORDER — INSULIN ASPART 100 UNIT/ML IJ SOLN: 0.0000 [IU] | Freq: Every day | INTRAMUSCULAR | Status: DC

## 2023-01-02 MED ORDER — ATORVASTATIN CALCIUM 20 MG PO TABS
40.0000 mg | ORAL_TABLET | Freq: Every day | ORAL | Status: DC
  Administered 2023-01-02 – 2023-01-06 (×5): 40 mg via ORAL
  Filled 2023-01-02 (×3): qty 2
  Filled 2023-01-02: qty 4
  Filled 2023-01-02: qty 2

## 2023-01-02 MED ORDER — INSULIN ASPART 100 UNIT/ML IJ SOLN
0.0000 [IU] | INTRAMUSCULAR | Status: DC
  Administered 2023-01-03 – 2023-01-06 (×5): 1 [IU] via SUBCUTANEOUS
  Filled 2023-01-02 (×5): qty 1

## 2023-01-02 MED ORDER — INSULIN ASPART 100 UNIT/ML IJ SOLN: 0.0000 [IU] | Freq: Three times a day (TID) | INTRAMUSCULAR | Status: DC

## 2023-01-02 MED ORDER — GABAPENTIN 300 MG PO CAPS
300.0000 mg | ORAL_CAPSULE | Freq: Two times a day (BID) | ORAL | Status: DC
  Administered 2023-01-02: 300 mg via ORAL
  Filled 2023-01-02: qty 1

## 2023-01-02 MED ORDER — CLONAZEPAM 1 MG PO TABS
1.0000 mg | ORAL_TABLET | Freq: Two times a day (BID) | ORAL | Status: DC
  Administered 2023-01-02: 1 mg via ORAL
  Filled 2023-01-02: qty 1

## 2023-01-02 NOTE — Progress Notes (Signed)
PT Cancellation Note  Patient Details Name: Anne Phillips MRN: [id number] DOB: 1951/03/19   Cancelled Treatment:    Reason Eval/Treat Not Completed: Other (comment).  Pt resting in bed upon PT arrival.  Pt declining physical therapy.  Therapist attempted to encourage pt to participate but pt reporting she gets LE pain when she works with therapy (no pain at rest though) so she did not want to do any activity.  Will re-attempt PT session at a later date/time.  Hendricks Limes, PT 01/02/23, 4:25 PM

## 2023-01-02 NOTE — Progress Notes (Signed)
[facility] Progress Note Patient: Anne Phillips ZOX:[id number] DOB: [birth year] DOA: 12/31/2022  DOS: the patient was seen and examined on 01/02/2023  Brief hospital course: PMH of CAD, breast cancer, HTN, HLD, seizures, hypothyroidism presented to hospital with complaints of confusion. slurred speech, opens eyes to voice, pt will answer some questions, pt states she fell out of bed tonight, but denies any head injury. Pt makes some involuntary movements while in the stretcher, pt unable to hold a conversation in triage.  Workup shows AKI treated with IV fluid. Assessment and Plan: AKI. Hypokalemia. Hypomagnesemia. Baseline serum creatinine around 1.0. On admission serum creatinine 1.8.  Potassium 3.0.  Magnesium 1.3.  Treated with IV fluids as well as IV replacement of potassium and magnesium. Serum creatinine improved to 1.07 with improvement in potassium.  Magnesium still low. Most likely this is prerenal etiology in the setting of poor p.o. intake. For now I will hold IV fluid and monitor.  Acute metabolic encephalopathy. Suspected to be in the setting of AKI with poor clearance of her psychotropic medications. Patient is on Klonopin 1 mg twice daily at home, gabapentin 300 mg twice daily, Keppra 500 - 500 - 750 mg, Vimpat 150 mg twice daily. Her Keppra and Vimpat were continued since admission.  Her gabapentin and Klonopin were held due to somnolence and gradually reintroduced on 5/3. So far no evidence of focal deficit to indicate stroke or seizures. Will attempt to continue Klonopin and gabapentin nightly with delirium precaution during the daytime.  If her mentation does not improve she will require neurology consultation for further adjustment of her seizure medications.  Recurrent fall. Patient originally presented to the hospital with a fall at home. While in the ER had another fall when she tried to climb out of the bed. Currently remains drowsy. CT maxillofacial was  performed which is negative for any acute fracture. CT head x 2 negative for any acute fracture.  CT C-spine negative for acute fracture. PT OT recommending SNF. Currently awaiting improvement in mentation and oral intake.  History of seizure disorder. Continue home regimen of Keppra, lacosamide.  Reduce dose of gabapentin.  Hypothyroidism. Holding methimazole in the setting of daytime sleepiness. TSH unremarkable.  Free T4 relatively stable.  HLD. Continue statin.  HTN. Blood pressure fluctuating significantly both hyper and hypotension. Currently on amlodipine 5 mg. Holding hydralazine and losartan.  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 Currently on sliding scale insulin.  Every 4 hours only.  Holding home oral hypoglycemic regimen.    Subjective: RN was at bedside.  Patient drowsy but easily awakened.  Able to follow commands.  Denies any acute complaint.  Complain about having burning urination last night as well as having concerns with regards to having seizure-like event.  None witnessed by the staff.  Patient was awake during the earlier part of the day and was able to eat breakfast per RN.  Physical Exam: Exam is limited due to patient's lack of cooperation. Pupils are reactive. Bilateral motor strength equal. Speech coherent. S1-S2 present. Bowel sound present. Clear to auscultation. Left facial bruise noted.  Data Reviewed: I have Reviewed nursing notes, Vitals, and Lab results. Reviewed BMP.  Reordered CBC and BMP.  Ordered CT maxillofacial to rule out any acute fracture.  Disposition: Status is: Inpatient Remains inpatient appropriate because: Need for improvement mentation and stability of oral intake.  SCDs Start: 12/31/22 8119   Family Communication: No one at bedside  Level of care: Telemetry Medical   Vitals:   01/02/23 0009 01/02/23 0700 01/02/23 0820 01/02/23 1538   BP: (!) 170/90  (!) 124/100 (!) 161/75  Pulse: 89  82 72  Resp: 16  16 18   Temp: 98.5 F (36.9 C)  98.6 F (37 C) 98.4 F (36.9 C)  TempSrc:      SpO2: 100%  100% 100%  Weight:  54 kg       Author: Lynden [place], MD 01/02/2023 4:20 PM  Please look on [email or web address] to find out who is on call.

## 2023-01-02 NOTE — TOC Progression Note (Signed)
Transition of Care [facility]) - Progression Note    Patient Details  Name: Anne Phillips MRN: [id number] Date of Birth: May 30, 1951  [facility] CM/SW Contact  Marlowe Sax, RN Phone Number: 01/02/2023, 2:47 PM  Clinical Narrative:   Went to the patient's room to discuss Bed options with her, she is sound asleep and not easily aroused, Will review at another time          Expected Discharge Plan and Services                                               Social Determinants of Health (SDOH) Interventions SDOH Screenings   Food Insecurity: Patient Unable To Answer (12/31/2022)  Housing: Low Risk  (10/28/2022)  Transportation Needs: Patient Unable To Answer (12/31/2022)  Utilities: Patient Unable To Answer (12/31/2022)  Tobacco Use: High Risk (10/28/2022)    Readmission Risk Interventions    10/12/2022   11:51 AM  Readmission Risk Prevention Plan  PCP or Specialist Appt within 3-5 Days Complete  Social Work Consult for Recovery Care Planning/Counseling Complete  Palliative Care Screening Not Applicable

## 2023-01-02 NOTE — Care Management (Addendum)
Patient attempting to get out of bed to pee. Upon assessment this RN discovered patient has pulled out her IV that the IV team was called to place earlier today. Patient continues to argue with this RN that she has not peed yet when in fact pt had attempted to get out of bed to pee just 3 minutes ago.  12:50am-Pt calling out stating she is going to have a seizure. Tele sitter notified this RN. Upon arrival to room, patient began getting out of bed stating she's going to have a seizure. This RN advised pt she has been given her seizure medication and if she is going to have a seizure, she should not be trying to get out of bed. Pt then stated, "I can't help it." Patient got back into bed. Patient had scant immeasurable urine in bedside commode.  1:10am- Called to patient's room to use commode  1:45am-Called to patient's room to use commode

## 2023-01-02 NOTE — Progress Notes (Signed)
SLP Cancellation Note  Patient Details Name: Anne Phillips MRN: [id number] DOB: Oct 07, 1950   Cancelled treatment:       Reason Eval/Treat Not Completed: Patient declined, no reason specified  Pt declined, no reason given. Chart reviewed and pt appears to be consuming POs without any documentation of difficulty. ST services will sign off at this time.   Kanasia Gayman 01/02/2023, 2:54 PM

## 2023-01-02 NOTE — Plan of Care (Signed)

## 2023-01-03 DIAGNOSIS — R410 Disorientation, unspecified: Secondary | ICD-10-CM | POA: Diagnosis not present

## 2023-01-03 LAB — COMPREHENSIVE METABOLIC PANEL
ALT: 13 U/L (ref 0–44)
AST: 17 U/L (ref 15–41)
Albumin: 3.2 g/dL — ABNORMAL LOW (ref 3.5–5.0)
Alkaline Phosphatase: 76 U/L (ref 38–126)
Anion gap: 6 (ref 5–15)
BUN: 13 mg/dL (ref 8–23)
CO2: 21 mmol/L — ABNORMAL LOW (ref 22–32)
Calcium: 8.6 mg/dL — ABNORMAL LOW (ref 8.9–10.3)
Chloride: 110 mmol/L (ref 98–111)
Creatinine, Ser: 0.97 mg/dL (ref 0.44–1.00)
GFR, Estimated: >60 mL/min (ref >60)
Glucose, Bld: 86 mg/dL (ref 70–99)
Potassium: 4 mmol/L (ref 3.5–5.1)
Sodium: 137 mmol/L (ref 135–145)
Total Bilirubin: 0.7 mg/dL (ref 0.3–1.2)
Total Protein: 6.3 g/dL — ABNORMAL LOW (ref 6.5–8.1)

## 2023-01-03 LAB — CBC WITH DIFFERENTIAL/PLATELET
Abs Immature Granulocytes: 0.03 10*3/uL (ref 0.00–0.07)
Basophils Absolute: 0.1 10*3/uL (ref 0.0–0.1)
Basophils Relative: 1 %
Eosinophils Absolute: 0.3 10*3/uL (ref 0.0–0.5)
Eosinophils Relative: 4 %
HCT: 27.6 % — ABNORMAL LOW (ref 36.0–46.0)
Hemoglobin: 8.9 g/dL — ABNORMAL LOW (ref 12.0–15.0)
Immature Granulocytes: 0 %
Lymphocytes Relative: 11 %
Lymphs Abs: 0.7 10*3/uL (ref 0.7–4.0)
MCH: 28 pg (ref 26.0–34.0)
MCHC: 32.2 g/dL (ref 30.0–36.0)
MCV: 86.8 fL (ref 80.0–100.0)
Monocytes Absolute: 0.6 10*3/uL (ref 0.1–1.0)
Monocytes Relative: 10 %
Neutro Abs: 5 10*3/uL (ref 1.7–7.7)
Neutrophils Relative %: 74 %
Platelets: 230 10*3/uL (ref 150–400)
RBC: 3.18 MIL/uL — ABNORMAL LOW (ref 3.87–5.11)
RDW: 16.1 % — ABNORMAL HIGH (ref 11.5–15.5)
WBC: 6.7 10*3/uL (ref 4.0–10.5)
nRBC: 0 % (ref 0.0–0.2)

## 2023-01-03 LAB — GLUCOSE, CAPILLARY
Glucose-Capillary: 80 mg/dL (ref 70–99)
Glucose-Capillary: 84 mg/dL (ref 70–99)
Glucose-Capillary: 130 mg/dL — ABNORMAL HIGH (ref 70–99)
Glucose-Capillary: 99 mg/dL (ref 70–99)
Glucose-Capillary: 122 mg/dL — ABNORMAL HIGH (ref 70–99)

## 2023-01-03 LAB — MAGNESIUM: Magnesium: 1.6 mg/dL — ABNORMAL LOW (ref 1.7–2.4)

## 2023-01-03 MED ORDER — LOPERAMIDE HCL 2 MG PO CAPS
2.0000 mg | ORAL_CAPSULE | ORAL | Status: DC | PRN
  Administered 2023-01-04 (×2): 2 mg via ORAL
  Filled 2023-01-03 (×2): qty 1

## 2023-01-03 NOTE — Plan of Care (Signed)

## 2023-01-03 NOTE — Progress Notes (Signed)
The author offered PRN medicine for mild constipation since her last BM was on May 2nd, patient refused, instead she said she wants to take Imodium, the Anne Phillips educate her about the medicine use.

## 2023-01-03 NOTE — Progress Notes (Signed)
PROGRESS NOTE  Anne Phillips RUE:[id number] DOB: [birth year] DOA: 12/31/2022 PCP: Gillis Santa, MD  Brief History   PMH of CAD, breast cancer, HTN, HLD, seizures, hypothyroidism presented to hospital with complaints of confusion. slurred speech, opens eyes to voice, pt will answer some questions, pt states she fell out of bed tonight, but denies any head injury. Pt makes some involuntary movements while in the stretcher, pt unable to hold a conversation in triage.  Workup shows AKI treated with IV fluid. Now resolved. Plan is for placement.  Today the patient is complaining of diarrhea. She states that her last BM was 2 days ago. She is confused.  A & P  AKI. Resolved. Hypokalemia. Resolved Hypomagnesemia. Mg 1.6 today - supplement. Baseline serum creatinine around 1.0. Creatinine is 0.97 today. Serum creatinine improved to 1.07 with improvement in potassium.  Magnesium still low. Most likely this is prerenal etiology in the setting of poor p.o. intake. For now I will hold IV fluid and monitor.   Acute metabolic encephalopathy. She remains confused today. Suspected to be in the setting of AKI with poor clearance of her psychotropic medications. Patient is on Klonopin 1 mg twice daily at home, gabapentin 300 mg twice daily, Keppra 500 - 500 - 750 mg, Vimpat 150 mg twice daily. Her Keppra and Vimpat were continued since admission.  Her gabapentin and Klonopin were held due to somnolence and gradually reintroduced on 5/3. So far no evidence of focal deficit to indicate stroke or seizures. Will attempt to continue Klonopin and gabapentin nightly with delirium precaution during the daytime.  If her mentation does not improve she will require neurology consultation for further adjustment of her seizure medications.   Recurrent fall. Patient originally presented to the hospital with a fall at home. She will need placement. While in the ER had another fall when she tried to climb out of the  bed. Currently remains drowsy. CT maxillofacial was performed which is negative for any acute fracture. CT head x 2 negative for any acute fracture.  CT C-spine negative for acute fracture. PT OT recommending SNF. Currently awaiting improvement in mentation and oral intake.  Extensive dental infection/cellulitis of soft tissues of face: Maxillofacial CT demonstrated no acute fractures or mandibular dislocation. There was a small left cheek hematoma with surrounding fat stranding. There were also extensive dental caries and periapical lucensies of maxillary and mandibular teeth. I will start her on cipro IV and draw blood cultures x 2.   History of seizure disorder. Continue home regimen of Keppra, lacosamide.  Reduce dose of gabapentin.   Hypothyroidism. Holding methimazole in the setting of daytime sleepiness. TSH unremarkable.  Free T4 relatively stable.   HLD. Continue statin.   HTN. Blood pressure fluctuating significantly both hyper and hypotension. Currently on amlodipine 5 mg. Holding hydralazine and losartan.   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 Currently on sliding scale insulin.  Every 4 hours only.  Holding home oral hypoglycemic regimen.  I have seen and examined this patient myself. I have spent 34 minutes in his evaluation and care.  DVT prophylaxis: SCD's Code Status: Full Code Family Communication: None available Disposition Plan: SNF placement.    Anne Jasek, DO [facility] Direct contact: see [email or web address]  7PM-7AM contact night coverage as above 01/03/2023, 5:40 PM  LOS: 3 days   Consultants  None  Procedures  None  Antibiotics   Anti-infectives (From admission, onward)    None  Subjective  The patient is resting comfortably. No new complaints.  Objective   Vitals:  Vitals:   01/03/23 0900 01/03/23 1557  BP: (!) 177/86 135/63   Pulse: 73 71  Resp: 18 17  Temp: 98.2 F (36.8 C) 98.2 F (36.8 C)  SpO2: 100% 100%    Exam:  Constitutional:  The patient is awake, alert, and oriented x 3. No acute distress. Respiratory:  No increased work of breathing. No wheezes, rales, or rhonchi No tactile fremitus Cardiovascular:  Regular rate and rhythm No murmurs, ectopy, or gallups. No lateral PMI. No thrills. Abdomen:  Abdomen is soft, non-tender, non-distended No hernias, masses, or organomegaly Normoactive bowel sounds.  Musculoskeletal:  No cyanosis, clubbing, or edema Skin:  No rashes, lesions, ulcers palpation of skin: no induration or nodules Neurologic:  CN 2-12 intact Sensation all 4 extremities intact Psychiatric:  Mental status Mood, affect appropriate Orientation to person, place, time  judgment and insight appear intact    I have personally reviewed the following:   Today's Data   Vitals:   01/03/23 0900 01/03/23 1557  BP: (!) 177/86 135/63  Pulse: 73 71  Resp: 18 17  Temp: 98.2 F (36.8 C) 98.2 F (36.8 C)  SpO2: 100% 100%     Lab Data  CBC    Component Value Date/Time   WBC 6.7 01/03/2023 0455   RBC 3.18 (L) 01/03/2023 0455   HGB 8.9 (L) 01/03/2023 0455   HCT 27.6 (L) 01/03/2023 0455   PLT 230 01/03/2023 0455   MCV 86.8 01/03/2023 0455   MCH 28.0 01/03/2023 0455   MCHC 32.2 01/03/2023 0455   RDW 16.1 (H) 01/03/2023 0455   LYMPHSABS 0.7 01/03/2023 0455   MONOABS 0.6 01/03/2023 0455   EOSABS 0.3 01/03/2023 0455   BASOSABS 0.1 01/03/2023 0455     Micro Data      Latest Ref Rng & Units 01/03/2023    4:55 AM 01/02/2023   10:14 AM 01/01/2023    7:28 AM  BMP  Glucose 70 - 99 mg/dL 86  97  161   BUN 8 - 23 mg/dL 13  15  18    Creatinine 0.44 - 1.00 mg/dL 0.96  0.45  4.09   Sodium 135 - 145 mmol/L 137  138  137   Potassium 3.5 - 5.1 mmol/L 4.0  3.9  4.0   Chloride 98 - 111 mmol/L 110  110  110   CO2 22 - 32 mmol/L 21  21  18    Calcium 8.9 - 10.3 mg/dL 8.6  8.6  8.5       Imaging  Maxillofacial CT:  Maxillofacial CT demonstrated no acute fractures or mandibular dislocation. There was a small left cheek hematoma with surrounding fat stranding. There were also extensive dental caries and periapical lucensies of maxillary and mandibular teeth.  CT head: No acute intracranial abnormality CT C Spine: No No acute fracture or static subluxation of the cervical spine. There was also a heterogeneous and enlarged thyroid gland that has previously been evaluated by ultrasound.  Cardiology Data  EKG: Evidence for LVH. Prolonged PR interval  Scheduled Meds:  amLODipine  5 mg Oral QPM   aspirin EC  81 mg Oral Daily   atorvastatin  40 mg Oral Daily   carvedilol  6.25 mg Oral BID   clonazePAM  1 mg Oral QHS   cyanocobalamin  1,000 mcg Oral Daily   feeding supplement (GLUCERNA SHAKE)  237 mL Oral TID BM   folic acid  1 mg Oral Daily   gabapentin  300 mg Oral QHS   insulin aspart  0-9 Units Subcutaneous Q4H   lacosamide  150 mg Oral BID   levETIRAcetam  500 mg Oral BID   levETIRAcetam  750 mg Oral QHS   magnesium oxide  800 mg Oral Daily   multivitamin with minerals  1 tablet Oral Daily   pantoprazole  40 mg Oral Daily   phenazopyridine  100 mg Oral TID WC   sodium chloride flush  3 mL Intravenous Q12H   triamcinolone 0.1 % cream : eucerin   Topical TID   Continuous Infusions:  Principal Problem:   Altered mental status   LOS: 3 days

## 2023-01-03 NOTE — Plan of Care (Signed)
  Problem: Education: Goal: Knowledge of General Education information will improve Description Including pain rating scale, medication(s)/side effects and non-pharmacologic comfort measures Outcome: Progressing   Problem: Health Behavior/Discharge Planning: Goal: Ability to manage health-related needs will improve Outcome: Progressing   Problem: Clinical Measurements: Goal: Ability to maintain clinical measurements within normal limits will improve Outcome: Progressing Goal: Will remain free from infection Outcome: Progressing Goal: Diagnostic test results will improve Outcome: Progressing Goal: Respiratory complications will improve Outcome: Progressing Goal: Cardiovascular complication will be avoided Outcome: Progressing   Problem: Activity: Goal: Risk for activity intolerance will decrease Outcome: Progressing   Problem: Nutrition: Goal: Adequate nutrition will be maintained Outcome: Progressing   Problem: Elimination: Goal: Will not experience complications related to bowel motility Outcome: Progressing Goal: Will not experience complications related to urinary retention Outcome: Progressing   Problem: Pain Managment: Goal: General experience of comfort will improve Outcome: Progressing   

## 2023-01-04 DIAGNOSIS — R410 Disorientation, unspecified: Secondary | ICD-10-CM | POA: Diagnosis not present

## 2023-01-04 LAB — CBC WITH DIFFERENTIAL/PLATELET
Abs Immature Granulocytes: 0.02 10*3/uL (ref 0.00–0.07)
Basophils Absolute: 0.1 10*3/uL (ref 0.0–0.1)
Basophils Relative: 1 %
Eosinophils Absolute: 0.2 10*3/uL (ref 0.0–0.5)
Eosinophils Relative: 5 %
HCT: 28.9 % — ABNORMAL LOW (ref 36.0–46.0)
Hemoglobin: 9.3 g/dL — ABNORMAL LOW (ref 12.0–15.0)
Immature Granulocytes: 0 %
Lymphocytes Relative: 13 %
Lymphs Abs: 0.7 10*3/uL (ref 0.7–4.0)
MCH: 28.1 pg (ref 26.0–34.0)
MCHC: 32.2 g/dL (ref 30.0–36.0)
MCV: 87.3 fL (ref 80.0–100.0)
Monocytes Absolute: 0.6 10*3/uL (ref 0.1–1.0)
Monocytes Relative: 12 %
Neutro Abs: 3.5 10*3/uL (ref 1.7–7.7)
Neutrophils Relative %: 69 %
Platelets: 231 10*3/uL (ref 150–400)
RBC: 3.31 MIL/uL — ABNORMAL LOW (ref 3.87–5.11)
RDW: 16.1 % — ABNORMAL HIGH (ref 11.5–15.5)
WBC: 5.1 10*3/uL (ref 4.0–10.5)
nRBC: 0 % (ref 0.0–0.2)

## 2023-01-04 LAB — BASIC METABOLIC PANEL
Anion gap: 6 (ref 5–15)
BUN: 17 mg/dL (ref 8–23)
CO2: 23 mmol/L (ref 22–32)
Calcium: 8.5 mg/dL — ABNORMAL LOW (ref 8.9–10.3)
Chloride: 108 mmol/L (ref 98–111)
Creatinine, Ser: 1.11 mg/dL — ABNORMAL HIGH (ref 0.44–1.00)
GFR, Estimated: 53 mL/min — ABNORMAL LOW (ref >60)
Glucose, Bld: 85 mg/dL (ref 70–99)
Potassium: 3.8 mmol/L (ref 3.5–5.1)
Sodium: 137 mmol/L (ref 135–145)

## 2023-01-04 LAB — GLUCOSE, CAPILLARY
Glucose-Capillary: 100 mg/dL — ABNORMAL HIGH (ref 70–99)
Glucose-Capillary: 113 mg/dL — ABNORMAL HIGH (ref 70–99)
Glucose-Capillary: 117 mg/dL — ABNORMAL HIGH (ref 70–99)
Glucose-Capillary: 122 mg/dL — ABNORMAL HIGH (ref 70–99)
Glucose-Capillary: 79 mg/dL (ref 70–99)
Glucose-Capillary: 81 mg/dL (ref 70–99)
Glucose-Capillary: 85 mg/dL (ref 70–99)

## 2023-01-04 NOTE — Plan of Care (Signed)
  Problem: Health Behavior/Discharge Planning: Goal: Ability to manage health-related needs will improve Outcome: Progressing   Problem: Clinical Measurements: Goal: Diagnostic test results will improve Outcome: Progressing   Problem: Activity: Goal: Risk for activity intolerance will decrease Outcome: Progressing   Problem: Elimination: Goal: Will not experience complications related to urinary retention Outcome: Progressing   Problem: Pain Managment: Goal: General experience of comfort will improve Outcome: Progressing   Problem: Safety: Goal: Ability to remain free from injury will improve Outcome: Progressing

## 2023-01-04 NOTE — Progress Notes (Signed)
PROGRESS NOTE  Anne Phillips ZOX:[id number] DOB: [birth year] DOA: 12/31/2022 PCP: Gillis Santa, MD  Brief History   PMH of CAD, breast cancer, HTN, HLD, seizures, hypothyroidism presented to hospital with complaints of confusion. slurred speech, opens eyes to voice, pt will answer some questions, pt states she fell out of bed tonight, but denies any head injury. Pt makes some involuntary movements while in the stretcher, pt unable to hold a conversation in triage.  Workup shows AKI treated with IV fluid. Now resolved. Plan is for placement.  Sensorium is slowly clearing.   A & P  AKI. Resolved. Hypokalemia. Resolved Hypomagnesemia. Mg 1.6 today - supplement. Baseline serum creatinine around 1.0. Creatinine is 0.97 today. Serum creatinine improved to 1.07 with improvement in potassium.  Magnesium still low. Most likely this is prerenal etiology in the setting of poor p.o. intake. For now I will hold IV fluid and monitor.   Acute metabolic encephalopathy. The patient is less confused this morning.  Suspected to be in the setting of AKI with poor clearance of her psychotropic medications. Patient is on Klonopin 1 mg twice daily at home, gabapentin 300 mg twice daily, Keppra 500 - 500 - 750 mg, Vimpat 150 mg twice daily. Her Keppra and Vimpat were continued since admission.  Her gabapentin and Klonopin were held due to somnolence and gradually reintroduced on 5/3. So far no evidence of focal deficit to indicate stroke or seizures. Will attempt to continue Klonopin and gabapentin nightly with delirium precaution during the daytime.  If her mentation does not improve she will require neurology consultation for further adjustment of her seizure medications.   Recurrent fall. Patient originally presented to the hospital with a fall at home. She will need placement. While in the ER had another fall when she tried to climb out of the bed. Currently remains drowsy. CT maxillofacial was  performed which is negative for any acute fracture. CT head x 2 negative for any acute fracture.  CT C-spine negative for acute fracture. PT OT recommending SNF. Currently awaiting improvement in mentation and oral intake.  Extensive dental infection/cellulitis of soft tissues of face: Maxillofacial CT demonstrated no acute fractures or mandibular dislocation. There was a small left cheek hematoma with surrounding fat stranding. There were also extensive dental caries and periapical lucensies of maxillary and mandibular teeth. The patient is receiving IV cipro. Blood cultures x 2 are drawn.   History of seizure disorder. Continue home regimen of Keppra, lacosamide.  Reduce dose of gabapentin.   Hypothyroidism. Holding methimazole in the setting of daytime sleepiness. TSH unremarkable.  Free T4 relatively stable.   HLD. Continue statin.   HTN. Blood pressure fluctuating significantly both hyper and hypotension. Currently on amlodipine 5 mg. Holding hydralazine and losartan.   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 Currently on sliding scale insulin.  Every 4 hours only.  Holding home oral hypoglycemic regimen.  I have seen and examined this patient myself. I have spent 34 minutes in his evaluation and care.  DVT prophylaxis: SCD's Code Status: Full Code Family Communication: None available Disposition Plan: SNF placement.    Duell Holdren, DO [facility] Direct contact: see [email or web address]  7PM-7AM contact night coverage as above 01/04/2023, 4:48 PM LOS: 3 days   Consultants  None  Procedures  None  Antibiotics   Anti-infectives (From admission, onward)    None        Subjective  The patient is resting  comfortably. No new complaints.  Objective   Vitals:  Vitals:   01/04/23 0740 01/04/23 1544  BP: (!) 156/81 136/71  Pulse: 73 67  Resp: 16 16  Temp: 98.3 F (36.8 C)  97.9 F (36.6 C)  SpO2: 100% 100%     Exam:  Constitutional:  The patient is less confused than yesterday. Still not oriented. No acute distress. Respiratory:  No increased work of breathing. No wheezes, rales, or rhonchi No tactile fremitus Cardiovascular:  Regular rate and rhythm No murmurs, ectopy, or gallups. No lateral PMI. No thrills. Abdomen:  Abdomen is soft, non-tender, non-distended No hernias, masses, or organomegaly Normoactive bowel sounds.  Musculoskeletal:  No cyanosis, clubbing, or edema Skin:  No rashes, lesions, ulcers palpation of skin: no induration or nodules Neurologic:  CN 2-12 intact Sensation all 4 extremities intact Psychiatric:  Mental status Mood, affect appropriate Orientation to person, place, time  judgment and insight do not appear intact    I have personally reviewed the following:   Today's Data   Vitals:   01/04/23 0740 01/04/23 1544  BP: (!) 156/81 136/71  Pulse: 73 67  Resp: 16 16  Temp: 98.3 F (36.8 C) 97.9 F (36.6 C)  SpO2: 100% 100%      Lab Data   CBC    Component Value Date/Time   WBC 5.1 01/04/2023 0437   RBC 3.31 (L) 01/04/2023 0437   HGB 9.3 (L) 01/04/2023 0437   HCT 28.9 (L) 01/04/2023 0437   PLT 231 01/04/2023 0437   MCV 87.3 01/04/2023 0437   MCH 28.1 01/04/2023 0437   MCHC 32.2 01/04/2023 0437   RDW 16.1 (H) 01/04/2023 0437   LYMPHSABS 0.7 01/04/2023 0437   MONOABS 0.6 01/04/2023 0437   EOSABS 0.2 01/04/2023 0437   BASOSABS 0.1 01/04/2023 0437      Micro Data      Latest Ref Rng & Units 01/04/2023    4:37 AM 01/03/2023    4:55 AM 01/02/2023   10:14 AM  BMP  Glucose 70 - 99 mg/dL 85  86  97   BUN 8 - 23 mg/dL 17  13  15    Creatinine 0.44 - 1.00 mg/dL 1.61  0.96  0.45   Sodium 135 - 145 mmol/L 137  137  138   Potassium 3.5 - 5.1 mmol/L 3.8  4.0  3.9   Chloride 98 - 111 mmol/L 108  110  110   CO2 22 - 32 mmol/L 23  21  21    Calcium 8.9 - 10.3 mg/dL 8.5  8.6  8.6       Imaging   Maxillofacial CT:  Maxillofacial CT demonstrated no acute fractures or mandibular dislocation. There was a small left cheek hematoma with surrounding fat stranding. There were also extensive dental caries and periapical lucensies of maxillary and mandibular teeth.  CT head: No acute intracranial abnormality CT C Spine: No No acute fracture or static subluxation of the cervical spine. There was also a heterogeneous and enlarged thyroid gland that has previously been evaluated by ultrasound.  Cardiology Data  EKG: Evidence for LVH. Prolonged PR interval  Scheduled Meds:  amLODipine  5 mg Oral QPM   aspirin EC  81 mg Oral Daily   atorvastatin  40 mg Oral Daily   carvedilol  6.25 mg Oral BID   clonazePAM  1 mg Oral QHS   cyanocobalamin  1,000 mcg Oral Daily   feeding supplement (GLUCERNA SHAKE)  237 mL Oral TID BM  folic acid  1 mg Oral Daily   gabapentin  300 mg Oral QHS   insulin aspart  0-9 Units Subcutaneous Q4H   lacosamide  150 mg Oral BID   levETIRAcetam  500 mg Oral BID   levETIRAcetam  750 mg Oral QHS   magnesium oxide  800 mg Oral Daily   multivitamin with minerals  1 tablet Oral Daily   pantoprazole  40 mg Oral Daily   phenazopyridine  100 mg Oral TID WC   sodium chloride flush  3 mL Intravenous Q12H   triamcinolone 0.1 % cream : eucerin   Topical TID   Continuous Infusions:  Principal Problem:   Altered mental status   LOS: 3 days

## 2023-01-05 DIAGNOSIS — R410 Disorientation, unspecified: Secondary | ICD-10-CM | POA: Diagnosis not present

## 2023-01-05 LAB — CBC
HCT: 29.9 % — ABNORMAL LOW (ref 36.0–46.0)
Hemoglobin: 9.6 g/dL — ABNORMAL LOW (ref 12.0–15.0)
MCH: 28.3 pg (ref 26.0–34.0)
MCHC: 32.1 g/dL (ref 30.0–36.0)
MCV: 88.2 fL (ref 80.0–100.0)
Platelets: 258 10*3/uL (ref 150–400)
RBC: 3.39 MIL/uL — ABNORMAL LOW (ref 3.87–5.11)
RDW: 16.7 % — ABNORMAL HIGH (ref 11.5–15.5)
WBC: 10.6 10*3/uL — ABNORMAL HIGH (ref 4.0–10.5)
nRBC: 0 % (ref 0.0–0.2)

## 2023-01-05 LAB — GLUCOSE, CAPILLARY
Glucose-Capillary: 93 mg/dL (ref 70–99)
Glucose-Capillary: 90 mg/dL (ref 70–99)
Glucose-Capillary: 96 mg/dL (ref 70–99)
Glucose-Capillary: 141 mg/dL — ABNORMAL HIGH (ref 70–99)
Glucose-Capillary: 109 mg/dL — ABNORMAL HIGH (ref 70–99)

## 2023-01-05 LAB — MAGNESIUM: Magnesium: 1.9 mg/dL (ref 1.7–2.4)

## 2023-01-05 LAB — BASIC METABOLIC PANEL
Anion gap: 10 (ref 5–15)
BUN: 18 mg/dL (ref 8–23)
CO2: 24 mmol/L (ref 22–32)
Calcium: 9 mg/dL (ref 8.9–10.3)
Chloride: 103 mmol/L (ref 98–111)
Creatinine, Ser: 1.14 mg/dL — ABNORMAL HIGH (ref 0.44–1.00)
GFR, Estimated: 52 mL/min — ABNORMAL LOW (ref >60)
Glucose, Bld: 99 mg/dL (ref 70–99)
Potassium: 4 mmol/L (ref 3.5–5.1)
Sodium: 137 mmol/L (ref 135–145)

## 2023-01-05 LAB — URINALYSIS, W/ REFLEX TO CULTURE (INFECTION SUSPECTED)
Bilirubin Urine: NEGATIVE
Glucose, UA: NEGATIVE mg/dL
Ketones, ur: NEGATIVE mg/dL
Nitrite: POSITIVE — AB
Protein, ur: NEGATIVE mg/dL
Specific Gravity, Urine: 1.009 (ref 1.005–1.030)
WBC, UA: >50 WBC/hpf (ref 0–5)
pH: 5 (ref 5.0–8.0)

## 2023-01-05 LAB — CULTURE, BLOOD (ROUTINE X 2): Special Requests: ADEQUATE

## 2023-01-05 LAB — PHOSPHORUS: Phosphorus: 2.7 mg/dL (ref 2.5–4.6)

## 2023-01-05 MED ORDER — METHIMAZOLE 5 MG PO TABS
5.0000 mg | ORAL_TABLET | Freq: Every day | ORAL | Status: DC
  Administered 2023-01-05 – 2023-01-06 (×2): 5 mg via ORAL
  Filled 2023-01-05 (×2): qty 1

## 2023-01-05 MED ORDER — VITAMIN D (ERGOCALCIFEROL) 1.25 MG (50000 UNIT) PO CAPS
50000.0000 [IU] | ORAL_CAPSULE | ORAL | Status: DC
  Administered 2023-01-05: 50000 [IU] via ORAL
  Filled 2023-01-05: qty 1

## 2023-01-05 MED ORDER — ENOXAPARIN SODIUM 40 MG/0.4ML IJ SOSY
40.0000 mg | PREFILLED_SYRINGE | Freq: Every evening | INTRAMUSCULAR | Status: DC
  Administered 2023-01-05 – 2023-01-06 (×2): 40 mg via SUBCUTANEOUS
  Filled 2023-01-05 (×2): qty 0.4

## 2023-01-05 NOTE — Plan of Care (Signed)
  Problem: Health Behavior/Discharge Planning: Goal: Ability to manage health-related needs will improve Outcome: Progressing   Problem: Clinical Measurements: Goal: Diagnostic test results will improve Outcome: Progressing Goal: Cardiovascular complication will be avoided Outcome: Progressing   Problem: Nutrition: Goal: Adequate nutrition will be maintained Outcome: Progressing   Problem: Elimination: Goal: Will not experience complications related to bowel motility Outcome: Progressing Goal: Will not experience complications related to urinary retention Outcome: Progressing   Problem: Pain Managment: Goal: General experience of comfort will improve Outcome: Progressing   

## 2023-01-05 NOTE — Plan of Care (Signed)

## 2023-01-05 NOTE — Progress Notes (Signed)
[facility] Progress Note  Patient: Anne Phillips    ZOX:[id number]  DOA: 12/31/2022     Date of Service: the patient was seen and examined on 01/05/2023  Chief Complaint  Patient presents with   Weakness   Altered Mental Status   Brief hospital course: PMH of CAD, breast cancer, HTN, HLD, seizures, hypothyroidism presented to hospital with complaints of confusion. slurred speech, opens eyes to voice, pt will answer some questions, pt states she fell out of bed tonight, but denies any head injury. Pt makes some involuntary movements while in the stretcher, pt unable to hold a conversation in triage.  Workup shows AKI treated with IV fluid. Now resolved. Plan is for placement.   Sensorium is slowly clearing   Assessment and Plan: AKI Baseline serum creatinine around 1.0.  Creatinine is 1.14 today Continue oral hydration and monitor renal functions.  Hypokalemia. Resolved Hypomagnesemia. Resolved    Acute metabolic encephalopathy. The patient is less confused this morning.  Suspected to be in the setting of AKI with poor clearance of her psychotropic medications. Patient is on Klonopin 1 mg twice daily at home, gabapentin 300 mg twice daily, Keppra 500 - 500 - 750 mg, Vimpat 150 mg twice daily. Her Keppra and Vimpat were continued since admission.  Her gabapentin and Klonopin were held due to somnolence and gradually reintroduced on 5/3. So far no evidence of focal deficit to indicate stroke or seizures. Will attempt to continue Klonopin and gabapentin nightly with delirium precaution during the daytime.  If her mentation does not improve she will require neurology consultation for further adjustment of her seizure medications.   Recurrent fall. Patient originally presented to the hospital with a fall at home. She will need placement. While in the ER had another fall when she tried to climb out of the bed. Currently remains drowsy. CT maxillofacial was performed which is  negative for any acute fracture. CT head x 2 negative for any acute fracture.  CT C-spine negative for acute fracture. PT OT recommending SNF. Currently awaiting improvement in mentation and oral intake.   Extensive dental infection/cellulitis of soft tissues of face: Maxillofacial CT demonstrated no acute fractures or mandibular dislocation. There was a small left cheek hematoma with surrounding fat stranding. There were also extensive dental caries and periapical lucensies of maxillary and mandibular teeth.  Blood cultures NGTD   History of seizure disorder. Continue home regimen of Keppra, lacosamide.  Reduce dose of gabapentin.   Hyperthyroidism. 5/6 Resumed methimazole 5 mg p.o. daily home dose  TSH low and Free T4 level 1.2 slightly elevated.     HLD. Continue statin.   HTN. Blood pressure fluctuating significantly both hyper and hypotension. Currently on amlodipine 5 mg. Holding hydralazine and losartan.   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 Currently on sliding scale insulin.  Every 4 hours only.  Holding home oral hypoglycemic regimen.   Body mass index is 22.49 kg/m.  Interventions:       Diet: Dysphagia 3 diet with thin liquids DVT Prophylaxis: Subcutaneous Lovenox   Advance goals of care discussion: Full code  Family Communication: family was not present at bedside, at the time of interview.  The pt provided permission to discuss medical plan with the family. Opportunity was given to ask question and all questions were answered satisfactorily.   Disposition:  Pt is from Home, admitted with Fall, AMD, still has risk of fall, which precludes a  safe discharge. Discharge to SNF vs Home with HH, clinically stable, medically optimized and can be discharged when bed will be available versus home set up will be done.  TOC is following for discharge planning.   Subjective: No  significant events overnight, patient was resting comfortably, denied any complaints.  Patient wanted to go home but she is having ambulatory dysfunction, high risk for fall. Most likely discharge tomorrow a.m. if remains stable  Physical Exam: General: NAD, lying comfortably Appear in no distress, affect appropriate Eyes: PERRLA ENT: Oral Mucosa Clear, moist  Neck: no JVD,  Cardiovascular: S1 and S2 Present, no Murmur,  Respiratory: good respiratory effort, Bilateral Air entry equal and Decreased, no Crackles, no wheezes Abdomen: Bowel Sound present, Soft and no tenderness,  Skin: no rashes Extremities: no Pedal edema, no calf tenderness Neurologic: without any new focal findings Gait not checked due to patient safety concerns  Vitals:   01/04/23 1544 01/04/23 2218 01/05/23 0811 01/05/23 1302  BP: 136/71 139/75 (!) 135/91 134/73  Pulse: 67 73 85 74  Resp: 16 16 16 17   Temp: 97.9 F (36.6 C) 97.8 F (36.6 C) 99.1 F (37.3 C) 98.7 F (37.1 C)  TempSrc:   Oral Oral  SpO2: 100% 97% 98% 99%  Weight:        Intake/Output Summary (Last 24 hours) at 01/05/2023 1636 Last data filed at 01/05/2023 1240 Gross per 24 hour  Intake 3 ml  Output --  Net 3 ml   Filed Weights   01/02/23 0700  Weight: 54 kg    Data Reviewed: I have personally reviewed and interpreted daily labs, tele strips, imagings as discussed above. I reviewed all nursing notes, pharmacy notes, vitals, pertinent old records I have discussed plan of care as described above with RN and patient/family.  CBC: Recent Labs  Lab 12/31/22 1152 01/01/23 0728 01/03/23 0455 01/04/23 0437 01/05/23 1235  WBC 7.4 8.6 6.7 5.1 10.6*  NEUTROABS  --   --  5.0 3.5  --   HGB 10.3* 9.8* 8.9* 9.3* 9.6*  HCT 32.6* 29.6* 27.6* 28.9* 29.9*  MCV 88.3 86.3 86.8 87.3 88.2  PLT 255 228 230 231 258   Basic Metabolic Panel: Recent Labs  Lab 12/31/22 1332 01/01/23 0728 01/02/23 1014 01/03/23 0455 01/04/23 0437 01/05/23 1235   NA 139 137 138 137 137 137  K 3.1* 4.0 3.9 4.0 3.8 4.0  CL 105 110 110 110 108 103  CO2 22 18* 21* 21* 23 24  GLUCOSE 95 131* 97 86 85 99  BUN 23 18 15 13 17 18   CREATININE 1.50* 1.26* 1.07* 0.97 1.11* 1.14*  CALCIUM 8.7* 8.5* 8.6* 8.6* 8.5* 9.0  MG 1.8 1.6* 1.6* 1.6*  --  1.9  PHOS  --   --   --   --   --  2.7    Studies: No results found.  Scheduled Meds:  amLODipine  5 mg Oral QPM   aspirin EC  81 mg Oral Daily   atorvastatin  40 mg Oral Daily   carvedilol  6.25 mg Oral BID   clonazePAM  1 mg Oral QHS   cyanocobalamin  1,000 mcg Oral Daily   feeding supplement (GLUCERNA SHAKE)  237 mL Oral TID BM   folic acid  1 mg Oral Daily   gabapentin  300 mg Oral QHS   insulin aspart  0-9 Units Subcutaneous Q4H   lacosamide  150 mg Oral BID   levETIRAcetam  500 mg Oral BID  levETIRAcetam  750 mg Oral QHS   magnesium oxide  800 mg Oral Daily   methimazole  5 mg Oral Daily   multivitamin with minerals  1 tablet Oral Daily   pantoprazole  40 mg Oral Daily   sodium chloride flush  3 mL Intravenous Q12H   triamcinolone 0.1 % cream : eucerin   Topical TID   Vitamin D (Ergocalciferol)  50,000 Units Oral Q7 days   Continuous Infusions: PRN Meds: acetaminophen **OR** acetaminophen, bisacodyl, loperamide, nitroGLYCERIN, ondansetron **OR** ondansetron (ZOFRAN) IV, senna-docusate  Time spent: 35 minutes  Author: Gillis Santa. MD [facility] 01/05/2023 4:36 PM  To reach On-call, see care teams to locate the attending and reach out to them via [email or web address]. If 7PM-7AM, please contact night-coverage If you still have difficulty reaching the attending provider, please page the [facility] (Director on Call) for [facility] on amion for assistance.

## 2023-01-05 NOTE — Progress Notes (Addendum)
Occupational Therapy Treatment Patient Details Name: Anne Phillips MRN: [id number] DOB: Jul 27, 1951 Today's Date: 01/05/2023   History of present illness Pt is a 72 y.o. female with a known history of breast cancer, coronary artery disease, hypertension, hyperlipidemia, seizure disorder, hyperthyroidism presents to the emergency department for evaluation of altered mental status. Recent admission on 11/23/2022, noted for Comminuted, displaced and overriding fracture of the distal 3rd of the right clavicular shaft.   OT comments  [name] was seen for OT treatment on this date. Upon arrival to room pt reclined in bed, agreeable to tx with encouragement. Pt requires MOD A x2 + HHA for toilet t/f, pt reports R knee pain limiting mobility attempts. Defers standing ADLs. Educated on supine HEP and green theraband provided, plan to review next session. Pt making progress toward goals, will continue to follow POC. Discharge recommendation remains appropriate.     Recommendations for follow up therapy are one component of a multi-disciplinary discharge planning process, led by the attending physician.  Recommendations may be updated based on patient status, additional functional criteria and insurance authorization.    Assistance Recommended at Discharge Frequent or constant Supervision/Assistance  Patient can return home with the following  Two people to help with walking and/or transfers;A lot of help with bathing/dressing/bathroom;Assistance with cooking/housework;Assistance with feeding;Direct supervision/assist for medications management;Direct supervision/assist for financial management;Assist for transportation;Help with stairs or ramp for entrance   Equipment Recommendations  BSC/3in1    Recommendations for Other Services      Precautions / Restrictions Precautions Precautions: Fall Restrictions Weight Bearing Restrictions: No       Mobility Bed Mobility Overal bed mobility: Needs  Assistance Bed Mobility: Supine to Sit, Sit to Supine     Supine to sit: Supervision Sit to supine: Supervision        Transfers Overall transfer level: Needs assistance Equipment used: 2 person hand held assist Transfers: Sit to/from Stand Sit to Stand: Min assist, +2 physical assistance                 Balance Overall balance assessment: Needs assistance Sitting-balance support: Feet supported Sitting balance-Leahy Scale: Good     Standing balance support: Single extremity supported, During functional activity Standing balance-Leahy Scale: Poor                             ADL either performed or assessed with clinical judgement   ADL Overall ADL's : Needs assistance/impaired                                       General ADL Comments: MOD A x2 + HHA for toilet t/f.      Cognition Arousal/Alertness: Awake/alert Behavior During Therapy: WFL for tasks assessed/performed Overall Cognitive Status: No family/caregiver present to determine baseline cognitive functioning                                          Exercises Other Exercises Other Exercises: educated on supine HEP and green theraband provided, pt performing 2-3 reps sidelying LAQ and hip Abduction            Pertinent Vitals/ Pain       Pain Assessment Pain Assessment: Faces Faces Pain Scale: Hurts even more Pain Location: R  leg Pain Descriptors / Indicators: Aching, Dull, Discomfort, Grimacing Pain Intervention(s): Limited activity within patient's tolerance, Repositioned   Frequency  Min 1X/week        Progress Toward Goals  OT Goals(current goals can now be found in the care plan section)  Progress towards OT goals: Progressing toward goals  Acute Rehab OT Goals Patient Stated Goal: to improve pain OT Goal Formulation: With patient Time For Goal Achievement: 01/15/23 Potential to Achieve Goals: Fair ADL Goals Pt Will Perform  Grooming: with modified independence;standing Pt Will Perform Lower Body Bathing: with modified independence;sit to/from stand Pt Will Perform Lower Body Dressing: with modified independence;sit to/from stand Pt Will Transfer to Toilet: with modified independence;bedside commode;ambulating Pt Will Perform Toileting - Clothing Manipulation and hygiene: with modified independence;sit to/from stand Pt Will Perform Tub/Shower Transfer: with modified independence;tub bench;ambulating  Plan Discharge plan remains appropriate;Frequency remains appropriate    Co-evaluation                 AM-PAC OT "6 Clicks" Daily Activity     Outcome Measure   Help from another person eating meals?: A Little Help from another person taking care of personal grooming?: A Little Help from another person toileting, which includes using toliet, bedpan, or urinal?: A Lot Help from another person bathing (including washing, rinsing, drying)?: A Lot Help from another person to put on and taking off regular upper body clothing?: A Lot Help from another person to put on and taking off regular lower body clothing?: A Lot 6 Click Score: 14    End of Session    OT Visit Diagnosis: Other symptoms and signs involving cognitive function   Activity Tolerance Patient limited by fatigue;Patient limited by pain   Patient Left in bed;with call bell/phone within reach;with bed alarm set   Nurse Communication          Time: 1610-9604 OT Time Calculation (min): 13 min  Charges: OT General Charges $OT Visit: 1 Visit OT Treatments $Self Care/Home Management : 8-22 mins  Kathie Dike, M.S. OTR/L  01/05/23, 2:50 PM  ascom [phone]

## 2023-01-05 NOTE — TOC Progression Note (Signed)
Transition of Care [facility]) - Progression Note    Patient Details  Name: Anne Phillips MRN: [id number] Date of Birth: 1950/09/09  Transition [facility] Care [facility]) CM/SW Contact  Marlowe Sax, RN Phone Number: 01/05/2023, 1:38 PM  Clinical Narrative:   Met with the patient She was able to tell me her name DOB, place and time She stated that she lives home with her sister and she is open with [facility] She does not want to go to Rehab and stated that she will rehab at home with [facility]  She stated that she has a rolling walker and a BSC at home Her sister provides transportation          Expected Discharge Plan and Services                                               Social Determinants of Health (SDOH) Interventions SDOH Screenings   Food Insecurity: Patient Unable To Answer (12/31/2022)  Housing: Low Risk  (10/28/2022)  Transportation Needs: Patient Unable To Answer (12/31/2022)  Utilities: Patient Unable To Answer (12/31/2022)  Tobacco Use: High Risk (10/28/2022)    Readmission Risk Interventions    10/12/2022   11:51 AM  Readmission Risk Prevention Plan  PCP or Specialist Appt within 3-5 Days Complete  Social Work Consult for Recovery Care Planning/Counseling Complete  Palliative Care Screening Not Applicable

## 2023-01-05 NOTE — Progress Notes (Signed)
Pt is refusing to keep cardiac monitor own. Received order to discontinue monitor per Dr Lucianne Muss

## 2023-01-05 NOTE — Progress Notes (Signed)
Physical Therapy Treatment Patient Details Name: Anne Phillips MRN: [id number] DOB: 11/17/1950 Today's Date: 01/05/2023   History of Present Illness Pt is a 72 y.o. female with a known history of breast cancer, coronary artery disease, hypertension, hyperlipidemia, seizure disorder, hyperthyroidism presents to the emergency department for evaluation of altered mental status. Recent admission on 11/23/2022, noted for Comminuted, displaced and overriding fracture of the distal 3rd of the right clavicular shaft.    PT Comments    Pt transferring to [facility] with tech upon arrival.  Once she voids, she is able to walk 20' in room with HHA +2.  She is given walker but resists it and does walk with HHA but will often reach for bed and counter for support.  Gait generally unsteady with LE weakness and she does voice some pain in LE's.  Sitting balance on bed with supervision.  She does some minimal seated AROM but does have difficulty following cues and staying on task.  Will cross legs   returns to supine on her own.     Recommendations for follow up therapy are one component of a multi-disciplinary discharge planning process, led by the attending physician.  Recommendations may be updated based on patient status, additional functional criteria and insurance authorization.  Follow Up Recommendations       Assistance Recommended at Discharge Frequent or constant Supervision/Assistance  Patient can return home with the following Two people to help with walking and/or transfers;Assistance with cooking/housework;Direct supervision/assist for medications management;Assist for transportation;Two people to help with bathing/dressing/bathroom;Assistance with feeding;Help with stairs or ramp for entrance   Equipment Recommendations  None recommended by PT    Recommendations for Other Services       Precautions / Restrictions Precautions Precautions: Fall Restrictions Weight Bearing Restrictions: No      Mobility  Bed Mobility Overal bed mobility: Needs Assistance Bed Mobility: Supine to Sit, Sit to Supine     Supine to sit: Supervision Sit to supine: Supervision        Transfers Overall transfer level: Needs assistance Equipment used: 2 person hand held assist Transfers: Sit to/from Stand Sit to Stand: Min assist, +2 physical assistance                Ambulation/Gait Ambulation/Gait assistance: Min assist, +2 physical assistance Gait Distance (Feet): 20 Feet Assistive device: 2 person hand held assist Gait Pattern/deviations: Step-through pattern, Decreased step length - right, Decreased step length - left Gait velocity: decreased     General Gait Details: does better with HHA and reaching for objects in room today.  resist using walker   Stairs             Wheelchair Mobility    Modified Rankin (Stroke Patients Only)       Balance Overall balance assessment: Needs assistance Sitting-balance support: Feet supported Sitting balance-Leahy Scale: Good     Standing balance support: Bilateral upper extremity supported Standing balance-Leahy Scale: Poor                              Cognition Arousal/Alertness: Awake/alert Behavior During Therapy: WFL for tasks assessed/performed Overall Cognitive Status: No family/caregiver present to determine baseline cognitive functioning                                          Exercises Other Exercises Other  Exercises: some seated AROM EOB but does have difficulty following cues for ex and to continue with task    General Comments        Pertinent Vitals/Pain Pain Assessment Pain Assessment: Faces Faces Pain Scale: Hurts little more Pain Location: B legs Pain Descriptors / Indicators: Sore    Home Living                          Prior Function            PT Goals (current goals can now be found in the care plan section) Progress towards PT goals:  Progressing toward goals    Frequency    Min 2X/week      PT Plan Current plan remains appropriate    Co-evaluation              AM-PAC PT "6 Clicks" Mobility   Outcome Measure  Help needed turning from your back to your side while in a flat bed without using bedrails?: None Help needed moving from lying on your back to sitting on the side of a flat bed without using bedrails?: A Little Help needed moving to and from a bed to a chair (including a wheelchair)?: A Little Help needed standing up from a chair using your arms (e.g., wheelchair or bedside chair)?: A Little Help needed to walk in hospital room?: A Lot Help needed climbing 3-5 steps with a railing? : Total 6 Click Score: 16    End of Session   Activity Tolerance: Patient tolerated treatment well Patient left: in bed;with call bell/phone within reach;with bed alarm set Nurse Communication: Mobility status PT Visit Diagnosis: Other abnormalities of gait and mobility (R26.89);Difficulty in walking, not elsewhere classified (R26.2)     Time: 1110-1120 PT Time Calculation (min) (ACUTE ONLY): 10 min  Charges:  $Gait Training: 8-22 mins                    Danielle Dess, PTA 01/05/23, 12:44 PM

## 2023-01-06 ENCOUNTER — Other Ambulatory Visit: Payer: Self-pay

## 2023-01-06 DIAGNOSIS — R410 Disorientation, unspecified: Secondary | ICD-10-CM | POA: Diagnosis not present

## 2023-01-06 LAB — CBC
HCT: 30.1 % — ABNORMAL LOW (ref 36.0–46.0)
Hemoglobin: 9.4 g/dL — ABNORMAL LOW (ref 12.0–15.0)
MCH: 27.8 pg (ref 26.0–34.0)
MCHC: 31.2 g/dL (ref 30.0–36.0)
MCV: 89.1 fL (ref 80.0–100.0)
Platelets: 281 10*3/uL (ref 150–400)
RBC: 3.38 MIL/uL — ABNORMAL LOW (ref 3.87–5.11)
RDW: 16.4 % — ABNORMAL HIGH (ref 11.5–15.5)
WBC: 8 10*3/uL (ref 4.0–10.5)
nRBC: 0 % (ref 0.0–0.2)

## 2023-01-06 LAB — BASIC METABOLIC PANEL
Anion gap: 7 (ref 5–15)
BUN: 19 mg/dL (ref 8–23)
CO2: 25 mmol/L (ref 22–32)
Calcium: 8.9 mg/dL (ref 8.9–10.3)
Chloride: 103 mmol/L (ref 98–111)
Creatinine, Ser: 1.13 mg/dL — ABNORMAL HIGH (ref 0.44–1.00)
GFR, Estimated: 52 mL/min — ABNORMAL LOW (ref >60)
Glucose, Bld: 95 mg/dL (ref 70–99)
Potassium: 3.7 mmol/L (ref 3.5–5.1)
Sodium: 135 mmol/L (ref 135–145)

## 2023-01-06 LAB — PHOSPHORUS: Phosphorus: 3 mg/dL (ref 2.5–4.6)

## 2023-01-06 LAB — CULTURE, BLOOD (ROUTINE X 2): Culture: NO GROWTH

## 2023-01-06 LAB — GLUCOSE, CAPILLARY
Glucose-Capillary: 119 mg/dL — ABNORMAL HIGH (ref 70–99)
Glucose-Capillary: 126 mg/dL — ABNORMAL HIGH (ref 70–99)
Glucose-Capillary: 89 mg/dL (ref 70–99)
Glucose-Capillary: 90 mg/dL (ref 70–99)
Glucose-Capillary: 99 mg/dL (ref 70–99)

## 2023-01-06 LAB — MAGNESIUM: Magnesium: 1.9 mg/dL (ref 1.7–2.4)

## 2023-01-06 MED ORDER — CEPHALEXIN 500 MG PO CAPS
500.0000 mg | ORAL_CAPSULE | Freq: Four times a day (QID) | ORAL | Status: DC
  Administered 2023-01-06 (×4): 500 mg via ORAL
  Filled 2023-01-06 (×4): qty 1

## 2023-01-06 MED ORDER — CEPHALEXIN 500 MG PO CAPS
500.0000 mg | ORAL_CAPSULE | Freq: Three times a day (TID) | ORAL | 0 refills | Status: AC
  Filled 2023-01-06: qty 21, 7d supply, fill #0

## 2023-01-06 NOTE — TOC Progression Note (Signed)
Transition of Care [facility]) - Progression Note    Patient Details  Name: Anne Phillips MRN: [id number] Date of Birth: 06/23/1951  Transition of Care [facility]) CM/SW Contact  Marlowe Sax, RN Phone Number: 01/06/2023, 11:19 AM  Clinical Narrative:    The patient continues with wanting to go home with [facility] services She has DME at home   Expected Discharge Plan: Home w Home Health Services Barriers to Discharge: Continued Medical Work up  Expected Discharge Plan and Services   Discharge Planning Services: CM Consult                     DME Arranged: N/A DME Agency: NA         HH Agency: [facility] Date [facility] Agency Contacted: 01/05/23 Time HH Agency Contacted: 1343 Representative spoke with at [facility] Agency: [place]   Social Determinants of Health (SDOH) Interventions SDOH Screenings   Food Insecurity: Patient Unable To Answer (12/31/2022)  Housing: Low Risk  (10/28/2022)  Transportation Needs: Patient Unable To Answer (12/31/2022)  Utilities: Patient Unable To Answer (12/31/2022)  Tobacco Use: High Risk (10/28/2022)    Readmission Risk Interventions    10/12/2022   11:51 AM  Readmission Risk Prevention Plan  PCP or Specialist Appt within 3-5 Days Complete  Social Work Consult for Recovery Care Planning/Counseling Complete  Palliative Care Screening Not Applicable

## 2023-01-06 NOTE — TOC Progression Note (Signed)
Transition of Care [facility]) - Progression Note    Patient Details  Name: Anne Phillips MRN: [id number] Date of Birth: 07-Sep-1950  Transition of Care [facility]) CM/SW Contact  Marlowe Sax, RN Phone Number: 01/06/2023, 2:20 PM  Clinical Narrative:    Met with the patient I let her know that her sister feels that she can not take care of her The patient stated that her sister lives with her and it is her house, she stated that she does not need her to take care of her that she takes care of herself, she stated that [facility] comes to her house already and that is what she plans to continue She stated that she is NOT going to go to rehab I explained to her that for safety it would be a good idea to go to rehab for a few days prior to going home I asked her how will she care for herself such as make her meals, she stated that she was doing it before coming in here and she will continue to do it The patient is alert and  oriented, I explained that she has the right to make her decisions  I encouraged her to at least sign up for meals on wheels She stated that she will think about it I asked her how will she get home and she said her sister, I asked her how will she get home if her sister will not pick her up, she stated she has others to call, she stated that she has all the DME she needs at home and that she does not need me for anything any longer.    Expected Discharge Plan: Home w Home Health Services Barriers to Discharge: Continued Medical Work up  Expected Discharge Plan and Services   Discharge Planning Services: CM Consult                     DME Arranged: N/A DME Agency: NA         HH Agency: [facility] Date [facility] Agency Contacted: 01/05/23 Time HH Agency Contacted: [phone] Representative spoke with at [facility] Agency: [place]   Social Determinants of Health (SDOH) Interventions SDOH Screenings   Food Insecurity: Patient Unable To Answer (12/31/2022)  Housing: Low Risk   (10/28/2022)  Transportation Needs: Patient Unable To Answer (12/31/2022)  Utilities: Patient Unable To Answer (12/31/2022)  Tobacco Use: High Risk (10/28/2022)    Readmission Risk Interventions    10/12/2022   11:51 AM  Readmission Risk Prevention Plan  PCP or Specialist Appt within 3-5 Days Complete  Social Work Consult for Recovery Care Planning/Counseling Complete  Palliative Care Screening Not Applicable

## 2023-01-06 NOTE — Progress Notes (Signed)
Physical Therapy Treatment Patient Details Name: Anne Phillips MRN: [id number] DOB: Oct 22, 1950 Today's Date: 01/06/2023   History of Present Illness Pt is a 72 y.o. female with a known history of breast cancer, coronary artery disease, hypertension, hyperlipidemia, seizure disorder, hyperthyroidism presents to the emergency department for evaluation of altered mental status. Recent admission on 11/23/2022, noted for Comminuted, displaced and overriding fracture of the distal 3rd of the right clavicular shaft.    PT Comments    Pt in bed asleep.  Initially declined then asked for her robe and transitioned to sitting.  Transferred to [facility] to void with min a x 1.  Able to do her own self care.  She then attempts gait with encouragement and uses a variety of assistive devices for gait -> RW, HHA, 1 hand on RW and 1 hand on bed/wall during session despite cues to keep hands on RW.  She does c/o pain BLE's and drags RLE behind her without fully advancing to or last left.  Upon turning she does seem to want to climb over foot of bed and footboard is removed for safety.  She does make it just around and sits at end and then climbs back into bed and returns to side lying and pulls blankets back up.  Discussed discharge plan with pt.  Stated her sister lives with her and will care for her.  Pt continues to demonstrate difficulty with mobility and general safety.  +2 is recommended for gait attempts.  If pt does chose to return home, she will benefit from RW, [facility], wheelchair as she is unable to ambulate household distances safely and would recommend +2 for any gait attempts.  She would have difficulty managing stairs and if up/down is needed she would benefit from a ramp.  Continued therapy services are recommended for discharge as fall risk remains elevated.   Recommendations for follow up therapy are one component of a multi-disciplinary discharge planning process, led by the attending physician.   Recommendations may be updated based on patient status, additional functional criteria and insurance authorization.  Follow Up Recommendations       Assistance Recommended at Discharge Frequent or constant Supervision/Assistance  Patient can return home with the following Two people to help with walking and/or transfers;Assistance with cooking/housework;Direct supervision/assist for medications management;Assist for transportation;Two people to help with bathing/dressing/bathroom;Assistance with feeding;Help with stairs or ramp for entrance   Equipment Recommendations  Rolling walker (2 wheels);BSC/3in1;Wheelchair (measurements PT);Wheelchair cushion (measurements PT)    Recommendations for Other Services       Precautions / Restrictions Precautions Precautions: Fall Restrictions Weight Bearing Restrictions: No     Mobility  Bed Mobility Overal bed mobility: Modified Independent                  Transfers Overall transfer level: Needs assistance Equipment used: 2 person hand held assist, Rolling walker (2 wheels) Transfers: Sit to/from Stand Sit to Stand: Min assist, +2 physical assistance           General transfer comment: to [facility] with no AD, uses walker and/or HHA +2, reaches for bed - genreally scatttered and hops from assist to asssist despite cues to keep RW    Ambulation/Gait Ambulation/Gait assistance: Min assist, +2 physical assistance Gait Distance (Feet): 15 Feet Assistive device: Rolling walker (2 wheels) Gait Pattern/deviations: Decreased step length - right, Decreased step length - left, Step-to pattern Gait velocity: decreased     General Gait Details: does use RW today but generally poor  quality and at times one hand on walker and one on bed or wall for support despite dues, drags RLE behind her   Stairs             Wheelchair Mobility    Modified Rankin (Stroke Patients Only)       Balance Overall balance assessment: Needs  assistance Sitting-balance support: Feet supported Sitting balance-Leahy Scale: Good     Standing balance support: Bilateral upper extremity supported Standing balance-Leahy Scale: Poor                              Cognition Arousal/Alertness: Awake/alert Behavior During Therapy: WFL for tasks assessed/performed Overall Cognitive Status: No family/caregiver present to determine baseline cognitive functioning                                          Exercises Other Exercises Other Exercises: to [facility] to void    General Comments        Pertinent Vitals/Pain Pain Assessment Pain Assessment: PAINAD Faces Pain Scale: Hurts even more Pain Location: R leg Pain Descriptors / Indicators: Aching, Dull, Discomfort, Grimacing Pain Intervention(s): Limited activity within patient's tolerance, Monitored during session, Repositioned    Home Living                          Prior Function            PT Goals (current goals can now be found in the care plan section) Progress towards PT goals: Progressing toward goals    Frequency    Min 2X/week      PT Plan Current plan remains appropriate    Co-evaluation              AM-PAC PT "6 Clicks" Mobility   Outcome Measure  Help needed turning from your back to your side while in a flat bed without using bedrails?: None Help needed moving from lying on your back to sitting on the side of a flat bed without using bedrails?: None Help needed moving to and from a bed to a chair (including a wheelchair)?: A Little Help needed standing up from a chair using your arms (e.g., wheelchair or bedside chair)?: A Little Help needed to walk in hospital room?: A Lot Help needed climbing 3-5 steps with a railing? : Total 6 Click Score: 17    End of Session Equipment Utilized During Treatment: Gait belt Activity Tolerance: Patient tolerated treatment well;Patient limited by pain Patient left: in  bed;with call bell/phone within reach;with bed alarm set Nurse Communication: Mobility status PT Visit Diagnosis: Other abnormalities of gait and mobility (R26.89);Difficulty in walking, not elsewhere classified (R26.2)     Time: 7829-5621 PT Time Calculation (min) (ACUTE ONLY): 10 min  Charges:  $Gait Training: 8-22 mins                    Danielle Dess, PTA 01/06/23, 10:42 AM

## 2023-01-06 NOTE — Progress Notes (Signed)
Mobility Specialist - Progress Note     01/06/23 1528  Mobility  Level of Assistance Contact guard assist, steadying assist  Assistive Device Front wheel walker  Range of Motion/Exercises Active  Activity Response Tolerated well  Mobility Referral Yes  $Mobility charge 1 Mobility  Mobility Specialist Start Time (ACUTE ONLY) 1504  Mobility Specialist Stop Time (ACUTE ONLY) 1529  Mobility Specialist Time Calculation (min) (ACUTE ONLY) 25 min   Pt resting in bed on RA upon entry. Pt STS and ambulates to hallway CGA with RW. Pt gait is slightly imbalanced; pt given verbal and manual cueing to correct imbalance and keep walker close to prevent falls. Pt returned to bed and left with needs in reach. Bed alarm activated.   [name] Mobility Specialist 01/06/23, 3:37 PM

## 2023-01-06 NOTE — Care Management Important Message (Signed)
Important Message  Patient Details  Name: Anne Phillips MRN: [id number] Date of Birth: [birth year]   Medicare Important Message Given:  Yes     Johnell Comings 01/06/2023, 10:46 AM

## 2023-01-06 NOTE — Progress Notes (Signed)
Mobility Specialist - Progress Note   01/06/23 1528  Mobility  Activity Ambulated with assistance in hallway;Stood at bedside  Level of Assistance Contact guard assist, steadying assist  Assistive Device Front wheel walker  Range of Motion/Exercises Active  Activity Response Tolerated well  Mobility Referral Yes  $Mobility charge 1 Mobility  Mobility Specialist Start Time (ACUTE ONLY) 1504  Mobility Specialist Stop Time (ACUTE ONLY) 1529  Mobility Specialist Time Calculation (min) (ACUTE ONLY) 25 min    Pt resting in bed on RA upon entry. Pt STS and ambulates to hallway CGA with RW. Pt gait is slightly imbalanced; pt given verbal and manual cueing to correct imbalance and keep walker close to prevent falls. Pt returned to bed and left with needs in reach. Bed alarm activated.   [name] Mobility Specialist 01/06/23, 3:39 PM

## 2023-01-06 NOTE — Discharge Summary (Signed)
[facility] Discharge Summary   Patient: Anne Phillips ZOX:[id number]  PCP: Gillis Santa, MD  Date of admission: 12/31/2022   Date of discharge:  01/06/2023     Discharge Diagnoses:  Principal Problem:   Altered mental status   Admitted From: Home Disposition:  Home with home health services  Recommendations for Outpatient Follow-up:  Follow-up with PCP in 1 week, continue to monitor BP at home and follow with PCP to titrate medications accordingly.  Blood pressure remained soft so discontinued amlodipine, hydralazine, losartan, spironolactone. PT and OT recommended SNF placement but patient is refusing, informed to patient's discharge that patient is adamant to go home with home health services.  Patient remains at high risk for readmission due to severe debility, unable to ambulate, remains at high risk for fall Follow up LABS/TEST: None   Diet recommendation: Dysphagia type 3 with thin liquid  Activity: The patient is advised to gradually reintroduce usual activities, as tolerated  Discharge Condition: stable  Code Status: Full code   History of present illness: As per the H and P dictated on admission  Hospital Course:  PMH of CAD, breast cancer, HTN, HLD, seizures, hypothyroidism presented to hospital with complaints of confusion. slurred speech, opens eyes to voice, pt will answer some questions, pt states she fell out of bed tonight, but denies any head injury. Pt makes some involuntary movements while in the stretcher, pt unable to hold a conversation in triage.  Workup shows AKI treated with IV fluid. Now resolved. Plan is for placement. Sensorium gradually improved and currently back to her baseline.    Assessment and Plan: # AKI most likely due to dehydration, Baseline serum creatinine around 1.0.  Creatinine 1.79 on admission.  Patient was given IV fluid, creatinine improved.  AKI resolved. # Hypokalemia. Resolved # Hypomagnesemia. Resolved  # Acute metabolic  encephalopathy, Suspected to be in the setting of AKI with poor clearance of her psychotropic medications. Patient is on Klonopin 1 mg twice daily at home, gabapentin 300 mg twice daily, Keppra 500 - 500 - 750 mg, Vimpat 150 mg twice daily. Her Keppra and Vimpat were continued since admission.  Her gabapentin and Klonopin were held due to somnolence and gradually reintroduced on 5/3. So far no evidence of focal deficit to indicate stroke or seizures. continue Klonopin and gabapentin nightly with delirium precaution during the daytime.  Mental status gradually improved, currently back to her baseline. # Recurrent fall: Patient originally presented to the hospital with a fall at home. She needs placement. While in the ER had another fall when she tried to climb out of the bed.  Patient was drowsy in the ED, mentation gradually improved. CT maxillofacial was performed which is negative for any acute fracture. CT head x 2 negative for any acute fracture.  CT C-spine negative for acute fracture. PT/OT recommending SNF placement, TOC was consulted but patient refused to go to SNF and wanted to go home with home health services. # Extensive dental infection/cellulitis of soft tissues of face: Maxillofacial CT demonstrated no acute fractures or mandibular dislocation. There was a small left cheek hematoma with surrounding fat stranding. There were also extensive dental caries and periapical lucensies of maxillary and mandibular teeth.  Blood cultures NGTD.  Patient denies any pain. # History of seizure disorder:Continue home regimen of Keppra, lacosamide.  Resumed gabapentin home dose on discharge.  # Hyperthyroidism: On 5/6 Resumed methimazole 5 mg p.o. daily home dose  TSH low and Free T4 level 1.2 slightly  elevated.   # HLD: Continue statin. # HTN: Blood pressure fluctuating significantly both hyper and hypotension.  Blood pressure was soft so discontinued amlodipine, hydralazine, losartan, spironolactone.   Patient was advised to monitor BP at home and follow with PCP to titrate medication accordingly.  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  Resumed Jardiance home dose, patient was advised to monitor fingerstick blood glucose at home and follow with PCP for further management as an outpatient. Body mass index is 22.49 kg/m.  Nutrition Interventions:   Patient was seen by physical therapy, who recommended Therapy, SNF placement p;lacement but patient refused.  On the day of the discharge the patient's vitals were stable, and no other acute medical condition were reported by patient. the patient was felt safe to be discharge at Home with Home health.  Patient remains at high risk for readmission as she has significant debility, unable to move by herself and at risk for frequent falls.  Patient was insisted to go to SNF but she refused and I also discussed with patient's both sisters and they do not wanted her to go to home but patient is not listening to anyone.  So finally patient was discharged home with home health services and she remains at high risk for readmission due to above comorbidities. [facility] Consultants: None Procedures: None  Discharge Exam: General: Appear in no distress, no Rash; Oral Mucosa Clear, moist. Cardiovascular: S1 and S2 Present, no Murmur, Respiratory: normal respiratory effort, Bilateral Air entry present and no Crackles, no wheezes Abdomen: Bowel Sound present, Soft and no tenderness, no hernia Extremities: no Pedal edema, no calf tenderness Neurology: alert and oriented to time, place, and person affect appropriate.  Filed Weights   01/02/23 0700  Weight: 54 kg   Vitals:   01/05/23 2302 01/06/23 0833  BP: 128/60 116/62  Pulse: 81 67  Resp: 16 17  Temp: 98.3 F (36.8 C) 98.6 F (37 C)  SpO2: 98% 98%    DISCHARGE MEDICATION: Allergies as of 01/06/2023       Reactions    Iodinated Contrast Media Hives, Other (See Comments)   Rash Other Reaction(s): Other (See Comments) Rash   Rash  Rash    Rash  Rash   Other Hives   Metal-reaction=rash Metal-reaction=rash        Medication List     STOP taking these medications    amLODipine 5 MG tablet Commonly known as: NORVASC   hydrALAZINE 50 MG tablet Commonly known as: APRESOLINE   losartan 100 MG tablet Commonly known as: COZAAR   nitroGLYCERIN 0.4 MG SL tablet Commonly known as: NITROSTAT   spironolactone 25 MG tablet Commonly known as: ALDACTONE       TAKE these medications    acetaminophen 325 MG tablet Commonly known as: TYLENOL Take 2 tablets (650 mg total) by mouth every 6 (six) hours as needed for mild pain (or Fever >/= 101).   ammonium lactate 12 % cream Commonly known as: AMLACTIN Apply 1 Application topically 2 (two) times daily.   aspirin EC 81 MG tablet Take 1 tablet (81 mg total) by mouth daily. Swallow whole.   atorvastatin 40 MG tablet Commonly known as: LIPITOR Take 40 mg by mouth daily.   carvedilol 6.25 MG tablet Commonly known as: COREG Take 6.25 mg by mouth 2 (two) times daily.   cephALEXin 500 MG capsule Commonly known as: KEFLEX Take 1 capsule (500 mg total) by mouth  3 (three) times daily for 7 days.   clonazePAM 1 MG tablet Commonly known as: KlonoPIN Take 1 tablet (1 mg total) by mouth 2 (two) times daily.   cyanocobalamin 1000 MCG tablet Take 1 tablet (1,000 mcg total) by mouth daily.   empagliflozin 10 MG Tabs tablet Commonly known as: JARDIANCE Take 10 mg by mouth daily.   feeding supplement Liqd Take 237 mLs by mouth 2 (two) times daily between meals.   folic acid 1 MG tablet Commonly known as: FOLVITE Take 1 tablet (1 mg total) by mouth daily.   gabapentin 300 MG capsule Commonly known as: NEURONTIN Take 1 capsule (300 mg total) by mouth 2 (two) times daily.   Lacosamide 150 MG Tabs Commonly known as: Vimpat Take 1 tablet  (150 mg total) by mouth in the morning and at bedtime.   levETIRAcetam 500 MG tablet Commonly known as: KEPPRA Take 1 tablet (500 mg total) by mouth 3 (three) times daily. What changed: when to take this   levETIRAcetam 250 MG tablet Commonly known as: KEPPRA Take 1 tablet (250 mg total) by mouth at bedtime. What changed: how much to take   methimazole 5 MG tablet Commonly known as: TAPAZOLE Take 1 tablet (5 mg total) by mouth daily.   multivitamin with minerals Tabs tablet Take 1 tablet by mouth daily.   nicotine 14 mg/24hr patch Commonly known as: NICODERM CQ - dosed in mg/24 hours Place 1 patch (14 mg total) onto the skin daily.   pantoprazole 40 MG tablet Commonly known as: Protonix Take 1 tablet (40 mg total) by mouth daily.   tacrolimus 0.1 % ointment Commonly known as: PROTOPIC Apply 1 Application topically daily as needed.   Vitamin D (Ergocalciferol) 1.25 MG (50000 UNIT) Caps capsule Commonly known as: DRISDOL Take 1 capsule (50,000 Units total) by mouth every 7 (seven) days.       Allergies  Allergen Reactions   Iodinated Contrast Media Hives and Other (See Comments)    Rash  Other Reaction(s): Other (See Comments)  Rash   Rash   Rash    Rash  Rash   Other Hives    Metal-reaction=rash Metal-reaction=rash    Discharge Instructions     Call MD for:  difficulty breathing, headache or visual disturbances   Complete by: As directed    Call MD for:  extreme fatigue   Complete by: As directed    Call MD for:  persistant dizziness or light-headedness   Complete by: As directed    Call MD for:  persistant nausea and vomiting   Complete by: As directed    Call MD for:  severe uncontrolled pain   Complete by: As directed    Call MD for:  temperature >100.4   Complete by: As directed    Diet - low sodium heart healthy   Complete by: As directed    Discharge instructions   Complete by: As directed    Follow-up with PCP in 1 week, continue to  monitor BP at home and follow with PCP to titrate medications accordingly.  Blood pressure remained soft so discontinued amlodipine, hydralazine, losartan, spironolactone. PT and OT recommended SNF placement but patient is refusing, informed to patient's discharge that patient is adamant to go home with home health services.  Patient remains at high risk for readmission due to severe debility, unable to ambulate, remains at high risk for fall.   Increase activity slowly   Complete by: As directed  The results of significant diagnostics from this hospitalization (including imaging, microbiology, ancillary and laboratory) are listed below for reference.    Significant Diagnostic Studies: CT MAXILLOFACIAL WO CONTRAST  Result Date: 01/02/2023 CLINICAL DATA:  Facial trauma, blunt. EXAM: CT MAXILLOFACIAL WITHOUT CONTRAST TECHNIQUE: Multidetector CT imaging of the maxillofacial structures was performed. Multiplanar CT image reconstructions were also generated. RADIATION DOSE REDUCTION: This exam was performed according to the departmental dose-optimization program which includes automated exposure control, adjustment of the mA and/or kV according to patient size and/or use of iterative reconstruction technique. COMPARISON:  None Available. FINDINGS: Osseous: No fracture or mandibular dislocation. Extensive dental caries and periapical lucencies of the maxillary and mandibular teeth. Orbits: Negative. No traumatic or inflammatory finding. Sinuses: Well aerated. Soft tissues: Small left cheek hematoma with surrounding fat stranding. Limited intracranial: No significant or unexpected finding. IMPRESSION: 1. No acute fracture or mandibular dislocation. 2. Small left cheek hematoma with surrounding fat stranding. 3. Extensive dental caries and periapical lucencies of the maxillary and mandibular teeth. Electronically Signed   By: Orvan Falconer M.D.   On: 01/02/2023 14:48   EEG adult  Result Date:  01/01/2023 Charlsie Quest, MD     01/01/2023  5:11 PM Patient Name: DESTANI SCHLACK MRN: [id number] Epilepsy Attending: Charlsie Quest Referring Physician/Provider: Rolly Salter, MD Date: 12/02/2022 Duration: 31.43 mins Patient history: 72yo F with ams getting eeg to evaluate for seizure Level of alertness: Awake, asleep AEDs during EEG study: LEV, LCM Technical aspects: This EEG study was done with scalp electrodes positioned according to the 10-20 International system of electrode placement. Electrical activity was reviewed with band pass filter of 1-70Hz , sensitivity of 7 uV/mm, display speed of 8mm/sec with a 60Hz  notched filter applied as appropriate. EEG data were recorded continuously and digitally stored.  Video monitoring was available and reviewed as appropriate. Description: The posterior dominant rhythm consists of 8 Hz activity of moderate voltage (25-35 uV) seen predominantly in posterior head regions, symmetric and reactive to eye opening and eye closing. Sleep was characterized by vertex waves, sleep spindles (12 to 14 Hz), maximal frontocentral region. EEG showed intermittent generalized polymorphic 3 to 6 Hz theta-delta slowing. Hyperventilation and photic stimulation were not performed.   ABNORMALITY - Intermittent slow, generalized IMPRESSION: This study is suggestive of moderate diffuse encephalopathy. No seizures or epileptiform discharges were seen throughout the recording. Priyanka Annabelle Harman   CT HEAD WO CONTRAST ( )  Result Date: 12/31/2022 CLINICAL DATA:  Larey Seat out of bed EXAM: CT HEAD WITHOUT CONTRAST TECHNIQUE: Contiguous axial images were obtained from the base of the skull through the vertex without intravenous contrast. RADIATION DOSE REDUCTION: This exam was performed according to the departmental dose-optimization program which includes automated exposure control, adjustment of the mA and/or kV according to patient size and/or use of iterative reconstruction technique.  COMPARISON:  CT brain 12/31/2022 FINDINGS: Brain: No acute territorial infarction, hemorrhage or intracranial mass. Mild chronic small vessel ischemic changes of the white matter. Mild atrophy. Stable ventricle size Vascular: No hyperdense vessels.  Carotid vascular calcification Skull: Normal. Negative for fracture or focal lesion. Sinuses/Orbits: No acute finding. Other: None IMPRESSION: 1. No CT evidence for acute intracranial abnormality. 2. Atrophy and chronic small vessel ischemic changes of the white matter. Electronically Signed   By: Jasmine Pang M.D.   On: 12/31/2022 02:59   DG Chest Portable 1 View  Result Date: 12/31/2022 CLINICAL DATA:  Altered mental status and weakness EXAM: PORTABLE CHEST 1 VIEW COMPARISON:  10/28/2022 FINDINGS: Cardiac shadow is mildly prominent but stable from the prior study. Aortic calcifications are seen. Previously seen distal right clavicular fracture is not well appreciated on this exam. The lungs are clear. No acute bony abnormality is noted. IMPRESSION: No acute abnormality noted. Electronically Signed   By: Alcide Clever M.D.   On: 12/31/2022 02:15   CT HEAD WO CONTRAST  Result Date: 12/31/2022 CLINICAL DATA:  Trauma EXAM: CT HEAD WITHOUT CONTRAST CT CERVICAL SPINE WITHOUT CONTRAST TECHNIQUE: Multidetector CT imaging of the head and cervical spine was performed following the standard protocol without intravenous contrast. Multiplanar CT image reconstructions of the cervical spine were also generated. RADIATION DOSE REDUCTION: This exam was performed according to the departmental dose-optimization program which includes automated exposure control, adjustment of the mA and/or kV according to patient size and/or use of iterative reconstruction technique. COMPARISON:  None Available. FINDINGS: CT HEAD FINDINGS Brain: There is no mass, hemorrhage or extra-axial collection. The size and configuration of the ventricles and extra-axial CSF spaces are normal. There is  hypoattenuation of the periventricular white matter, most commonly indicating chronic ischemic microangiopathy. Vascular: Atherosclerotic calcification of the vertebral and internal carotid arteries at the skull base. No abnormal hyperdensity of the major intracranial arteries or dural venous sinuses. Skull: The visualized skull base, calvarium and extracranial soft tissues are normal. Sinuses/Orbits: No fluid levels or advanced mucosal thickening of the visualized paranasal sinuses. No mastoid or middle ear effusion. The orbits are normal. CT CERVICAL SPINE FINDINGS Alignment: No static subluxation. Facets are aligned. Occipital condyles are normally positioned. Skull base and vertebrae: No acute fracture. Soft tissues and spinal canal: No prevertebral fluid or swelling. No visible canal hematoma. Disc levels: No advanced spinal canal or neural foraminal stenosis. Upper chest: No pneumothorax, pulmonary nodule or pleural effusion. Other: Heterogeneous and enlarged thyroid gland. IMPRESSION: 1. No acute intracranial abnormality. 2. Chronic ischemic microangiopathy. 3. No acute fracture or static subluxation of the cervical spine. 4. Heterogeneous and enlarged thyroid gland. This has been previously evaluated with ultrasound. Electronically Signed   By: Deatra Robinson M.D.   On: 12/31/2022 01:58   CT Cervical Spine Wo Contrast  Result Date: 12/31/2022 CLINICAL DATA:  Trauma EXAM: CT HEAD WITHOUT CONTRAST CT CERVICAL SPINE WITHOUT CONTRAST TECHNIQUE: Multidetector CT imaging of the head and cervical spine was performed following the standard protocol without intravenous contrast. Multiplanar CT image reconstructions of the cervical spine were also generated. RADIATION DOSE REDUCTION: This exam was performed according to the departmental dose-optimization program which includes automated exposure control, adjustment of the mA and/or kV according to patient size and/or use of iterative reconstruction technique.  COMPARISON:  None Available. FINDINGS: CT HEAD FINDINGS Brain: There is no mass, hemorrhage or extra-axial collection. The size and configuration of the ventricles and extra-axial CSF spaces are normal. There is hypoattenuation of the periventricular white matter, most commonly indicating chronic ischemic microangiopathy. Vascular: Atherosclerotic calcification of the vertebral and internal carotid arteries at the skull base. No abnormal hyperdensity of the major intracranial arteries or dural venous sinuses. Skull: The visualized skull base, calvarium and extracranial soft tissues are normal. Sinuses/Orbits: No fluid levels or advanced mucosal thickening of the visualized paranasal sinuses. No mastoid or middle ear effusion. The orbits are normal. CT CERVICAL SPINE FINDINGS Alignment: No static subluxation. Facets are aligned. Occipital condyles are normally positioned. Skull base and vertebrae: No acute fracture. Soft tissues and spinal canal: No prevertebral fluid or swelling. No visible canal hematoma. Disc levels: No advanced spinal  canal or neural foraminal stenosis. Upper chest: No pneumothorax, pulmonary nodule or pleural effusion. Other: Heterogeneous and enlarged thyroid gland. IMPRESSION: 1. No acute intracranial abnormality. 2. Chronic ischemic microangiopathy. 3. No acute fracture or static subluxation of the cervical spine. 4. Heterogeneous and enlarged thyroid gland. This has been previously evaluated with ultrasound. Electronically Signed   By: Deatra Robinson M.D.   On: 12/31/2022 01:58    Microbiology: Recent Results (from the past 240 hour(s))  Culture, blood (Routine X 2) w Reflex to ID Panel     Status: None (Preliminary result)   Collection Time: 01/04/23  5:53 PM   Specimen: BLOOD LEFT HAND  Result Value Ref Range Status   Specimen Description BLOOD LEFT HAND  Final   Special Requests   Final    BOTTLES DRAWN AEROBIC AND ANAEROBIC Blood Culture results may not be optimal due to an  excessive volume of blood received in culture bottles   Culture   Final    NO GROWTH 2 DAYS Performed at [facility], [address] [place], [place] [address]    Report Status PENDING  Incomplete  Culture, blood (Routine X 2) w Reflex to ID Panel     Status: None (Preliminary result)   Collection Time: 01/04/23  6:01 PM   Specimen: BLOOD  Result Value Ref Range Status   Specimen Description BLOOD RIGHT ANTECUBITAL  Final   Special Requests   Final    BOTTLES DRAWN AEROBIC AND ANAEROBIC Blood Culture adequate volume   Culture   Final    NO GROWTH 2 DAYS Performed at [facility], [address] [place], [place] [address]    Report Status PENDING  Incomplete     Labs: CBC: Recent Labs  Lab 01/01/23 0728 01/03/23 0455 01/04/23 0437 01/05/23 1235 01/06/23 0736  WBC 8.6 6.7 5.1 10.6* 8.0  NEUTROABS  --  5.0 3.5  --   --   HGB 9.8* 8.9* 9.3* 9.6* 9.4*  HCT 29.6* 27.6* 28.9* 29.9* 30.1*  MCV 86.3 86.8 87.3 88.2 89.1  PLT 228 230 231 258 281   Basic Metabolic Panel: Recent Labs  Lab 01/01/23 0728 01/02/23 1014 01/03/23 0455 01/04/23 0437 01/05/23 1235 01/06/23 0736  NA 137 138 137 137 137 135  K 4.0 3.9 4.0 3.8 4.0 3.7  CL 110 110 110 108 103 103  CO2 18* 21* 21* 23 24 25   GLUCOSE 131* 97 86 85 99 95  BUN 18 15 13 17 18 19   CREATININE 1.26* 1.07* 0.97 1.11* 1.14* 1.13*  CALCIUM 8.5* 8.6* 8.6* 8.5* 9.0 8.9  MG 1.6* 1.6* 1.6*  --  1.9 1.9  PHOS  --   --   --   --  2.7 3.0   Liver Function Tests: Recent Labs  Lab 12/31/22 0150 12/31/22 1332 01/03/23 0455  AST 49* 34 17  ALT 31 27 13   ALKPHOS 114 102 76  BILITOT 0.7 0.8 0.7  PROT 7.9 7.2 6.3*  ALBUMIN 3.8 3.5 3.2*   No results for input(s): "LIPASE", "AMYLASE" in the last 168 hours. No results for input(s): "AMMONIA" in the last 168 hours. Cardiac Enzymes: No results for input(s): "CKTOTAL", "CKMB", "CKMBINDEX", "TROPONINI" in the last 168 hours. BNP (last 3 results) No results for  input(s): "BNP" in the last 8760 hours. CBG: Recent Labs  Lab 01/05/23 2007 01/06/23 0025 01/06/23 0409 01/06/23 0721 01/06/23 1146  GLUCAP 109* 119* 89 90 99    Time spent: 35 minutes  Signed:  Gillis Santa  [facility] 01/06/2023 3:07 PM

## 2023-01-06 NOTE — Progress Notes (Signed)
       CROSS COVER NOTE  NAME: Anne Phillips MRN: [id number] DOB : Mar 19, 1951 ATTENDING PHYSICIAN: Anne Santa, MD    Date of Service   01/06/2023   HPI/Events of Note   Urinalysis resulted positive for nitrities with "large" leukocytes, >50 WBCs and "Rare" bacteria. Anne Phillips is experiencing urinary frequency.  Interventions   Assessment/Plan:  UTI Keflex      To reach the provider On-Call:   7AM- 7PM see care teams to locate the attending and reach out to them via [email or web address] Password: [email or web address] 7PM-7AM contact night-coverage If you still have difficulty reaching the appropriate provider, please page the [facility] (Director on Call) for [facility] on amion for assistance  This document was prepared using Conservation officer, historic buildings and may include unintentional dictation errors.  Anne Limbo DNP, MBA, FNP-BC, PMHNP-BC Nurse Practitioner [facility] Pager [phone]

## 2023-01-06 NOTE — Plan of Care (Signed)

## 2023-01-07 LAB — CULTURE, BLOOD (ROUTINE X 2)

## 2023-01-09 LAB — CULTURE, BLOOD (ROUTINE X 2): Culture: NO GROWTH

## 2023-01-12 DIAGNOSIS — G40219 Localization-related (focal) (partial) symptomatic epilepsy and epileptic syndromes with complex partial seizures, intractable, without status epilepticus: Principal | ICD-10-CM

## 2023-01-12 MED ORDER — LEVETIRACETAM 500 MG TABLET
ORAL_TABLET | 3 refills | 0 days | Status: CP
Start: 2023-01-12 — End: ?

## 2023-05-05 DIAGNOSIS — I5030 Unspecified diastolic (congestive) heart failure: Principal | ICD-10-CM

## 2023-05-05 DIAGNOSIS — E7800 Pure hypercholesterolemia, unspecified: Principal | ICD-10-CM

## 2023-05-05 MED ORDER — SPIRONOLACTONE 25 MG TABLET
ORAL_TABLET | Freq: Every day | ORAL | 0 refills | 90 days | Status: CP
Start: 2023-05-05 — End: 2024-04-29

## 2023-05-05 MED ORDER — EMPAGLIFLOZIN 10 MG TABLET
ORAL_TABLET | Freq: Every day | ORAL | 0 refills | 90 days | Status: CP
Start: 2023-05-05 — End: 2024-04-29

## 2023-05-05 MED ORDER — ATORVASTATIN 40 MG TABLET
ORAL | 0 refills | 90 days | Status: CP
Start: 2023-05-05 — End: 2024-05-04

## 2023-06-08 MED ORDER — LOSARTAN 100 MG TABLET
ORAL_TABLET | ORAL | 1 refills | 90 days | Status: CP
Start: 2023-06-08 — End: 2024-06-02

## 2023-06-22 DIAGNOSIS — G40219 Localization-related (focal) (partial) symptomatic epilepsy and epileptic syndromes with complex partial seizures, intractable, without status epilepticus: Principal | ICD-10-CM

## 2023-06-22 MED ORDER — CLONAZEPAM 1 MG TABLET
ORAL_TABLET | Freq: Two times a day (BID) | ORAL | 1 refills | 90 days
Start: 2023-06-22 — End: ?

## 2023-06-29 MED ORDER — CLONAZEPAM 1 MG TABLET
ORAL_TABLET | ORAL | 1 refills | 90 days | Status: CP
Start: 2023-06-29 — End: ?

## 2023-07-06 MED ORDER — NITROGLYCERIN 0.4 MG SUBLINGUAL TABLET
ORAL_TABLET | SUBLINGUAL | 0 refills | 1 days | Status: CP | PRN
Start: 2023-07-06 — End: ?

## 2023-07-14 ENCOUNTER — Ambulatory Visit
Admit: 2023-07-14 | Payer: MEDICARE | Attending: Student in an Organized Health Care Education/Training Program | Primary: Student in an Organized Health Care Education/Training Program

## 2023-07-18 DIAGNOSIS — I5030 Unspecified diastolic (congestive) heart failure: Principal | ICD-10-CM

## 2023-07-18 DIAGNOSIS — E7800 Pure hypercholesterolemia, unspecified: Principal | ICD-10-CM

## 2023-07-18 MED ORDER — ATORVASTATIN 40 MG TABLET
ORAL_TABLET | Freq: Every day | ORAL | 3 refills | 0 days
Start: 2023-07-18 — End: ?

## 2023-07-18 MED ORDER — JARDIANCE 10 MG TABLET
ORAL_TABLET | Freq: Every day | ORAL | 3 refills | 0 days
Start: 2023-07-18 — End: ?

## 2023-07-18 MED ORDER — SPIRONOLACTONE 25 MG TABLET
ORAL_TABLET | ORAL | 3 refills | 0 days
Start: 2023-07-18 — End: ?

## 2023-07-20 DIAGNOSIS — C50412 Malignant neoplasm of upper-outer quadrant of left female breast: Principal | ICD-10-CM

## 2023-07-20 DIAGNOSIS — Z171 Estrogen receptor negative status [ER-]: Principal | ICD-10-CM

## 2023-07-20 MED ORDER — JARDIANCE 10 MG TABLET
ORAL_TABLET | Freq: Every day | ORAL | 0 refills | 30 days | Status: CP
Start: 2023-07-20 — End: ?

## 2023-07-20 MED ORDER — ATORVASTATIN 40 MG TABLET
ORAL_TABLET | Freq: Every day | ORAL | 0 refills | 30 days | Status: CP
Start: 2023-07-20 — End: ?

## 2023-07-20 MED ORDER — SPIRONOLACTONE 25 MG TABLET
ORAL_TABLET | ORAL | 0 refills | 0 days | Status: CP
Start: 2023-07-20 — End: ?

## 2023-08-04 ENCOUNTER — Emergency Department
Admission: EM | Admit: 2023-08-04 | Discharge: 2023-08-04 | Disposition: A | Discharge status: Home / Self Care | Payer: Medicare HMO | Attending: Emergency Medicine | Admitting: Emergency Medicine

## 2023-08-04 ENCOUNTER — Emergency Department: Payer: Medicare HMO

## 2023-08-04 ENCOUNTER — Other Ambulatory Visit: Payer: Self-pay

## 2023-08-04 DIAGNOSIS — M79604 Pain in right leg: Secondary | ICD-10-CM | POA: Insufficient documentation

## 2023-08-04 DIAGNOSIS — R2243 Localized swelling, mass and lump, lower limb, bilateral: Secondary | ICD-10-CM | POA: Insufficient documentation

## 2023-08-04 DIAGNOSIS — E785 Hyperlipidemia, unspecified: Secondary | ICD-10-CM | POA: Diagnosis not present

## 2023-08-04 DIAGNOSIS — I2510 Atherosclerotic heart disease of native coronary artery without angina pectoris: Secondary | ICD-10-CM | POA: Diagnosis not present

## 2023-08-04 DIAGNOSIS — R2233 Localized swelling, mass and lump, upper limb, bilateral: Secondary | ICD-10-CM | POA: Insufficient documentation

## 2023-08-04 DIAGNOSIS — M791 Myalgia, unspecified site: Secondary | ICD-10-CM | POA: Diagnosis not present

## 2023-08-04 DIAGNOSIS — R252 Cramp and spasm: Secondary | ICD-10-CM | POA: Insufficient documentation

## 2023-08-04 DIAGNOSIS — R600 Localized edema: Secondary | ICD-10-CM | POA: Diagnosis not present

## 2023-08-04 DIAGNOSIS — I10 Essential (primary) hypertension: Secondary | ICD-10-CM | POA: Insufficient documentation

## 2023-08-04 LAB — CBC
HCT: 27.8 % — ABNORMAL LOW (ref 36.0–46.0)
Hemoglobin: 9.2 g/dL — ABNORMAL LOW (ref 12.0–15.0)
MCH: 27.7 pg (ref 26.0–34.0)
MCHC: 33.1 g/dL (ref 30.0–36.0)
MCV: 83.7 fL (ref 80.0–100.0)
Platelets: 205 10*3/uL (ref 150–400)
RBC: 3.32 MIL/uL — ABNORMAL LOW (ref 3.87–5.11)
RDW: 15.1 % (ref 11.5–15.5)
WBC: 5.5 10*3/uL (ref 4.0–10.5)
nRBC: 0 % (ref 0.0–0.2)

## 2023-08-04 LAB — COMPREHENSIVE METABOLIC PANEL
ALT: 9 U/L (ref 0–44)
AST: 16 U/L (ref 15–41)
Albumin: 3.3 g/dL — ABNORMAL LOW (ref 3.5–5.0)
Alkaline Phosphatase: 68 U/L (ref 38–126)
Anion gap: 12 (ref 5–15)
BUN: 25 mg/dL — ABNORMAL HIGH (ref 8–23)
CO2: 20 mmol/L — ABNORMAL LOW (ref 22–32)
Calcium: 7.7 mg/dL — ABNORMAL LOW (ref 8.9–10.3)
Chloride: 98 mmol/L (ref 98–111)
Creatinine, Ser: 1.27 mg/dL — ABNORMAL HIGH (ref 0.44–1.00)
GFR, Estimated: 45 mL/min — ABNORMAL LOW (ref >60)
Glucose, Bld: 91 mg/dL (ref 70–99)
Potassium: 3.5 mmol/L (ref 3.5–5.1)
Sodium: 130 mmol/L — ABNORMAL LOW (ref 135–145)
Total Bilirubin: 0.7 mg/dL (ref <1.2)
Total Protein: 6.7 g/dL (ref 6.5–8.1)

## 2023-08-04 LAB — BRAIN NATRIURETIC PEPTIDE: B Natriuretic Peptide: 1909.8 pg/mL — ABNORMAL HIGH (ref 0.0–100.0)

## 2023-08-04 MED ORDER — OXYCODONE-ACETAMINOPHEN 5-325 MG PO TABS
1.0000 | ORAL_TABLET | Freq: Once | ORAL | Status: AC
  Administered 2023-08-04: 1 via ORAL
  Filled 2023-08-04: qty 1

## 2023-08-04 MED ORDER — CLONIDINE HCL 0.1 MG PO TABS
0.1000 mg | ORAL_TABLET | Freq: Once | ORAL | Status: AC
  Administered 2023-08-04: 0.1 mg via ORAL
  Filled 2023-08-04: qty 1

## 2023-08-04 MED ORDER — HYDROCODONE-ACETAMINOPHEN 5-325 MG PO TABS
1.0000 | ORAL_TABLET | ORAL | 0 refills | Status: DC | PRN
  Filled 2023-08-04: qty 15, 3d supply, fill #0

## 2023-08-04 MED ORDER — LEVETIRACETAM 500 MG PO TABS
500.0000 mg | ORAL_TABLET | Freq: Once | ORAL | Status: AC
  Administered 2023-08-04: 500 mg via ORAL
  Filled 2023-08-04: qty 1

## 2023-08-04 MED ORDER — CALCIUM GLUCONATE-NACL 1-0.675 GM/50ML-% IV SOLN
1.0000 g | Freq: Once | INTRAVENOUS | Status: AC
  Administered 2023-08-04: 1000 mg via INTRAVENOUS
  Filled 2023-08-04: qty 50

## 2023-08-04 MED ORDER — CALCIUM CARBONATE ANTACID 500 MG PO CHEW
1.0000 | CHEWABLE_TABLET | Freq: Two times a day (BID) | ORAL | 0 refills | Status: DC
  Filled 2023-08-04: qty 150, 75d supply, fill #0

## 2023-08-04 NOTE — ED Notes (Signed)
Patient Alert and oriented to baseline. Stable and ambulatory to baseline. Patient verbalized understanding of the discharge instructions.  Patient belongings were taken by the patient.   

## 2023-08-04 NOTE — ED Provider Notes (Signed)
[facility] Provider Note    Event Date/Time   First MD Initiated Contact with Patient 08/04/23 1325     (approximate)  History   Chief Complaint: Leg Pain  HPI  Anne Phillips is a 72 y.o. female with a past medical history of CAD, hypertension, hyperlipidemia, prior TIA/stroke with right sided symptoms who presents to the emergency department for right lower extremity pain and cramping.  According to the patient in April she experienced a small stroke affecting her right side.  Since that time she is having to use a walker and has been experiencing intermittent pain/cramping in her right leg.  Patient states over the past 1 week the cramping and pain has gotten significantly worse so she came to the emergency department because she could not sleep last night due to the discomfort.  Upon my evaluation patient is currently sleeping in the bed but upon awakening is complaining of pain in the right leg throughout the entire leg not a focal area.  Denies any pain in the joint she states the pain feels like it is in the muscles.  No fever.  No vomiting.  Patient noted to have significant hypertension 197/117 in the emergency department but states that she has been compliant with her blood pressure medications.  Physical Exam   Triage Vital Signs: ED Triage Vitals  Encounter Vitals Group     BP 08/04/23 1238 (!) 197/117     Systolic BP Percentile --      Diastolic BP Percentile --      Pulse Rate 08/04/23 1238 84     Resp 08/04/23 1238 19     Temp 08/04/23 1238 98.6 F (37 C)     Temp Source 08/04/23 1238 Oral     SpO2 08/04/23 1238 92 %     Weight 08/04/23 1237 130 lb (59 kg)     Height 08/04/23 1237 5' (1.524 m)     Head Circumference --      Peak Flow --      Pain Score 08/04/23 1237 8     Pain Loc --      Pain Education --      Exclude from Growth Chart --     Most recent vital signs: Vitals:   08/04/23 1238  BP: (!) 197/117  Pulse: 84  Resp: 19   Temp: 98.6 F (37 C)  SpO2: 92%    General: Awake, no distress.  CV:  Good peripheral perfusion.  Regular rate and rhythm  Resp:  Normal effort.  Equal breath sounds bilaterally.  Abd:  No distention.  Soft, nontender.  No rebound or guarding. Other:  Patient has mild edema bilaterally in the lower extremities also has upper extremity edema.  She states this is typical for her.   ED Results / Procedures / Treatments   MEDICATIONS ORDERED IN ED: Medications  oxyCODONE-acetaminophen (PERCOCET/ROXICET) 5-325 MG per tablet 1 tablet (has no administration in time range)  cloNIDine (CATAPRES) tablet 0.1 mg (has no administration in time range)     IMPRESSION / MDM / ASSESSMENT AND PLAN / ED COURSE  I reviewed the triage vital signs and the nursing notes.  Patient's presentation is most consistent with acute presentation with potential threat to life or bodily function.  Patient presents to the emergency department for right leg pain.  States a history of the same ever since her stroke in April but states that has been worse over the past 1 week.  Patient does have edema however she states this is chronic and it appears equal in the bilateral lower extremities.  However given the unilateral symptoms we will obtain an ultrasound to rule out DVT.  We will check labs and continue to closely monitor.  Given the patient's significant hypertension of 197/117 we will dose 0.1 mg of clonidine in addition to 1 Percocet tablet for discomfort.  Patient agreeable to plan of care and workup.  Patient's lab work shows hypocalcemia.  We will dose IV calcium gluconate.  We will discharge with calcium supplements.  CBC reassuring.  Ultrasound is pending.  Patient care signed out to oncoming provider.  As long as the ultrasound is negative patient will be discharged home with a short course of pain medication and calcium supplementation.  FINAL CLINICAL IMPRESSION(S) / ED DIAGNOSES   Right leg  pain Musculoskeletal pain  Note:  This document was prepared using Dragon voice recognition software and may include unintentional dictation errors.   Minna Antis, MD 08/04/23 [phone]

## 2023-08-04 NOTE — ED Triage Notes (Signed)
Rever to First Nurse note.

## 2023-08-04 NOTE — ED Triage Notes (Signed)
First nurse note: pt to ED [facility] from home for right leg pain for one week. Swelling to bilateral legs. SBP >200, took some bp meds this am

## 2023-08-04 NOTE — ED Provider Notes (Signed)
  Physical Exam  BP (!) 181/106   Pulse 84   Temp 98.6 F (37 C) (Oral)   Resp 19   Ht 5' (1.524 m)   Wt 59 kg   SpO2 92%   BMI 25.39 kg/m   Physical Exam  Procedures  Procedures  ED Course / MDM   Clinical Course as of 08/04/23 1553  Tue Aug 04, 2023  1551 US Venous Img Lower Unilateral Right No evidence of femoropopliteal DVT or superficial thrombophlebitis within the RIGHT lower extremity. [DW]    Clinical Course User Index [DW] Janith Lima, MD   Medical Decision Making Amount and/or Complexity of Data Reviewed Labs: ordered. Radiology:  Decision-making details documented in ED Course.  Risk OTC drugs. Prescription drug management.   Received patient in signout.  72 year old female presenting today for right lower extremity pain and cramping.  Symptoms been present for many months but worsened over the past week.  Laboratory workup largely reassuring and patient did have improvement in symptoms with Percocet.  Plan was for follow-up ultrasound to rule out DVT.  Ultrasound venous imaging of right lower extremity shows no evidence of DVT.  Patient sent home with medication treating pain symptoms and told to follow-up with PCP.  Given strict return precautions.     Janith Lima, MD 08/04/23 [phone]

## 2023-08-04 NOTE — ED Notes (Signed)
Pt sts that she has taken all of her BP meds this AM.

## 2023-08-04 NOTE — ED Notes (Signed)
Assisted pt to restroom  

## 2023-08-04 NOTE — Discharge Instructions (Signed)
Please take your calcium supplements twice daily for the next 2 weeks.  Please take your pain medication as needed but only as written.  Do not drink alcohol or drive while taking this medication.  Please follow-up with your doctor within the next 2 to 3 days for recheck/reevaluation.  Return to the emergency department for any symptoms personally concerning to yourself.

## 2023-08-10 DIAGNOSIS — I5030 Unspecified diastolic (congestive) heart failure: Principal | ICD-10-CM

## 2023-08-10 DIAGNOSIS — E7800 Pure hypercholesterolemia, unspecified: Principal | ICD-10-CM

## 2023-08-10 MED ORDER — SPIRONOLACTONE 25 MG TABLET
ORAL_TABLET | ORAL | 0 refills | 0.00 days | Status: CP
Start: 2023-08-10 — End: ?

## 2023-08-10 MED ORDER — JARDIANCE 10 MG TABLET
ORAL_TABLET | Freq: Every day | ORAL | 0 refills | 30.00 days | Status: CP
Start: 2023-08-10 — End: ?

## 2023-08-10 MED ORDER — ATORVASTATIN 40 MG TABLET
ORAL_TABLET | Freq: Every day | ORAL | 0 refills | 30 days | Status: CP
Start: 2023-08-10 — End: ?

## 2023-09-02 DIAGNOSIS — E7800 Pure hypercholesterolemia, unspecified: Principal | ICD-10-CM

## 2023-09-02 DIAGNOSIS — I5030 Unspecified diastolic (congestive) heart failure: Principal | ICD-10-CM

## 2023-09-02 MED ORDER — ATORVASTATIN 40 MG TABLET
ORAL_TABLET | Freq: Every day | ORAL | 11 refills | 0.00 days
Start: 2023-09-02 — End: ?

## 2023-09-02 MED ORDER — SPIRONOLACTONE 25 MG TABLET
ORAL_TABLET | ORAL | 11 refills | 0.00 days
Start: 2023-09-02 — End: ?

## 2023-09-02 MED ORDER — JARDIANCE 10 MG TABLET
ORAL_TABLET | Freq: Every day | ORAL | 11 refills | 0.00 days
Start: 2023-09-02 — End: ?

## 2023-09-03 MED ORDER — ATORVASTATIN 40 MG TABLET
ORAL_TABLET | Freq: Every day | ORAL | 0 refills | 30 days | Status: CP
Start: 2023-09-03 — End: ?

## 2023-09-03 MED ORDER — JARDIANCE 10 MG TABLET
ORAL_TABLET | Freq: Every day | ORAL | 0 refills | 30.00 days
Start: 2023-09-03 — End: ?

## 2023-09-03 MED ORDER — SPIRONOLACTONE 25 MG TABLET
ORAL_TABLET | ORAL | 0 refills | 0.00 days | Status: CP
Start: 2023-09-03 — End: ?

## 2023-09-28 DIAGNOSIS — E7800 Pure hypercholesterolemia, unspecified: Principal | ICD-10-CM

## 2023-09-28 DIAGNOSIS — I5030 Unspecified diastolic (congestive) heart failure: Principal | ICD-10-CM

## 2023-09-28 MED ORDER — SPIRONOLACTONE 25 MG TABLET
ORAL_TABLET | ORAL | 11 refills | 0.00 days
Start: 2023-09-28 — End: ?

## 2023-09-28 MED ORDER — ATORVASTATIN 40 MG TABLET
ORAL_TABLET | Freq: Every day | ORAL | 11 refills | 30.00 days
Start: 2023-09-28 — End: ?

## 2023-10-16 ENCOUNTER — Observation Stay: Payer: Medicare HMO

## 2023-10-16 ENCOUNTER — Emergency Department: Payer: Medicare HMO

## 2023-10-16 ENCOUNTER — Ambulatory Visit: Payer: Medicare HMO

## 2023-10-16 ENCOUNTER — Observation Stay
Admission: EM | Admit: 2023-10-16 | Discharge: 2023-10-20 | Disposition: A | Discharge status: Home / Self Care | Payer: Medicare HMO | Attending: Internal Medicine | Admitting: Internal Medicine

## 2023-10-16 ENCOUNTER — Observation Stay (HOSPITAL_BASED_OUTPATIENT_CLINIC_OR_DEPARTMENT_OTHER)
Admit: 2023-10-16 | Discharge: 2023-10-16 | Disposition: A | Discharge status: Home / Self Care | Payer: Medicare HMO | Attending: Family Medicine | Admitting: Family Medicine

## 2023-10-16 ENCOUNTER — Other Ambulatory Visit: Payer: Self-pay

## 2023-10-16 ENCOUNTER — Encounter: Payer: Self-pay | Admitting: Emergency Medicine

## 2023-10-16 DIAGNOSIS — E0580 Other thyrotoxicosis without thyrotoxic crisis or storm: Secondary | ICD-10-CM | POA: Diagnosis not present

## 2023-10-16 DIAGNOSIS — M6281 Muscle weakness (generalized): Secondary | ICD-10-CM | POA: Diagnosis not present

## 2023-10-16 DIAGNOSIS — R569 Unspecified convulsions: Secondary | ICD-10-CM | POA: Diagnosis not present

## 2023-10-16 DIAGNOSIS — I4891 Unspecified atrial fibrillation: Secondary | ICD-10-CM

## 2023-10-16 DIAGNOSIS — E876 Hypokalemia: Secondary | ICD-10-CM | POA: Insufficient documentation

## 2023-10-16 DIAGNOSIS — Z7982 Long term (current) use of aspirin: Secondary | ICD-10-CM | POA: Insufficient documentation

## 2023-10-16 DIAGNOSIS — G459 Transient cerebral ischemic attack, unspecified: Secondary | ICD-10-CM

## 2023-10-16 DIAGNOSIS — G9340 Encephalopathy, unspecified: Secondary | ICD-10-CM | POA: Insufficient documentation

## 2023-10-16 DIAGNOSIS — I2510 Atherosclerotic heart disease of native coronary artery without angina pectoris: Secondary | ICD-10-CM | POA: Insufficient documentation

## 2023-10-16 DIAGNOSIS — E039 Hypothyroidism, unspecified: Secondary | ICD-10-CM | POA: Diagnosis not present

## 2023-10-16 DIAGNOSIS — I5032 Chronic diastolic (congestive) heart failure: Secondary | ICD-10-CM | POA: Diagnosis not present

## 2023-10-16 DIAGNOSIS — I5030 Unspecified diastolic (congestive) heart failure: Secondary | ICD-10-CM | POA: Insufficient documentation

## 2023-10-16 DIAGNOSIS — E785 Hyperlipidemia, unspecified: Secondary | ICD-10-CM | POA: Insufficient documentation

## 2023-10-16 DIAGNOSIS — R262 Difficulty in walking, not elsewhere classified: Secondary | ICD-10-CM | POA: Insufficient documentation

## 2023-10-16 DIAGNOSIS — I480 Paroxysmal atrial fibrillation: Secondary | ICD-10-CM | POA: Insufficient documentation

## 2023-10-16 DIAGNOSIS — F17210 Nicotine dependence, cigarettes, uncomplicated: Secondary | ICD-10-CM | POA: Insufficient documentation

## 2023-10-16 DIAGNOSIS — Z859 Personal history of malignant neoplasm, unspecified: Secondary | ICD-10-CM | POA: Insufficient documentation

## 2023-10-16 DIAGNOSIS — N1832 Chronic kidney disease, stage 3b: Secondary | ICD-10-CM | POA: Insufficient documentation

## 2023-10-16 DIAGNOSIS — R0989 Other specified symptoms and signs involving the circulatory and respiratory systems: Secondary | ICD-10-CM

## 2023-10-16 DIAGNOSIS — I10 Essential (primary) hypertension: Secondary | ICD-10-CM | POA: Diagnosis present

## 2023-10-16 DIAGNOSIS — Z79899 Other long term (current) drug therapy: Secondary | ICD-10-CM | POA: Insufficient documentation

## 2023-10-16 DIAGNOSIS — Z8673 Personal history of transient ischemic attack (TIA), and cerebral infarction without residual deficits: Secondary | ICD-10-CM | POA: Diagnosis not present

## 2023-10-16 DIAGNOSIS — G40909 Epilepsy, unspecified, not intractable, without status epilepticus: Secondary | ICD-10-CM | POA: Diagnosis present

## 2023-10-16 DIAGNOSIS — E0590 Thyrotoxicosis, unspecified without thyrotoxic crisis or storm: Secondary | ICD-10-CM

## 2023-10-16 DIAGNOSIS — N183 Chronic kidney disease, stage 3 unspecified: Secondary | ICD-10-CM | POA: Diagnosis not present

## 2023-10-16 DIAGNOSIS — I130 Hypertensive heart and chronic kidney disease with heart failure and stage 1 through stage 4 chronic kidney disease, or unspecified chronic kidney disease: Secondary | ICD-10-CM | POA: Insufficient documentation

## 2023-10-16 LAB — CBC WITH DIFFERENTIAL/PLATELET
Abs Immature Granulocytes: 0.03 10*3/uL (ref 0.00–0.07)
Basophils Absolute: 0 10*3/uL (ref 0.0–0.1)
Basophils Relative: 0 %
Eosinophils Absolute: 0.1 10*3/uL (ref 0.0–0.5)
Eosinophils Relative: 1 %
HCT: 33.2 % — ABNORMAL LOW (ref 36.0–46.0)
Hemoglobin: 11 g/dL — ABNORMAL LOW (ref 12.0–15.0)
Immature Granulocytes: 0 %
Lymphocytes Relative: 7 %
Lymphs Abs: 0.5 10*3/uL — ABNORMAL LOW (ref 0.7–4.0)
MCH: 29.1 pg (ref 26.0–34.0)
MCHC: 33.1 g/dL (ref 30.0–36.0)
MCV: 87.8 fL (ref 80.0–100.0)
Monocytes Absolute: 0.6 10*3/uL (ref 0.1–1.0)
Monocytes Relative: 7 %
Neutro Abs: 6.3 10*3/uL (ref 1.7–7.7)
Neutrophils Relative %: 85 %
Platelets: 184 10*3/uL (ref 150–400)
RBC: 3.78 MIL/uL — ABNORMAL LOW (ref 3.87–5.11)
RDW: 15.3 % (ref 11.5–15.5)
WBC: 7.5 10*3/uL (ref 4.0–10.5)
nRBC: 0 % (ref 0.0–0.2)

## 2023-10-16 LAB — COMPREHENSIVE METABOLIC PANEL
ALT: 13 U/L (ref 0–44)
AST: 26 U/L (ref 15–41)
Albumin: 3.7 g/dL (ref 3.5–5.0)
Alkaline Phosphatase: 62 U/L (ref 38–126)
Anion gap: 14 (ref 5–15)
BUN: 14 mg/dL (ref 8–23)
CO2: 30 mmol/L (ref 22–32)
Calcium: 7.5 mg/dL — ABNORMAL LOW (ref 8.9–10.3)
Chloride: 89 mmol/L — ABNORMAL LOW (ref 98–111)
Creatinine, Ser: 1.27 mg/dL — ABNORMAL HIGH (ref 0.44–1.00)
GFR, Estimated: 45 mL/min — ABNORMAL LOW (ref >60)
Glucose, Bld: 101 mg/dL — ABNORMAL HIGH (ref 70–99)
Potassium: 2.3 mmol/L — CL (ref 3.5–5.1)
Sodium: 133 mmol/L — ABNORMAL LOW (ref 135–145)
Total Bilirubin: 1 mg/dL (ref 0.0–1.2)
Total Protein: 7.4 g/dL (ref 6.5–8.1)

## 2023-10-16 LAB — ECHOCARDIOGRAM COMPLETE
AR max vel: 1.05 cm2
AV Area VTI: 1.03 cm2
AV Area mean vel: 0.92 cm2
AV Mean grad: 7 mm[Hg]
AV Peak grad: 13.8 mm[Hg]
Ao pk vel: 1.86 m/s
Area-P 1/2: 4.36 cm2
Height: 60 in
MV VTI: 1.21 cm2
P 1/2 time: 472 ms
S' Lateral: 2.5 cm
Weight: 2081.14 [oz_av]

## 2023-10-16 LAB — MAGNESIUM: Magnesium: 1 mg/dL — ABNORMAL LOW (ref 1.7–2.4)

## 2023-10-16 LAB — TROPONIN I (HIGH SENSITIVITY): Troponin I (High Sensitivity): 48 ng/L — ABNORMAL HIGH (ref <18)

## 2023-10-16 LAB — CBG MONITORING, ED: Glucose-Capillary: 115 mg/dL — ABNORMAL HIGH (ref 70–99)

## 2023-10-16 MED ORDER — POTASSIUM CHLORIDE 10 MEQ/100ML IV SOLN
10.0000 meq | INTRAVENOUS | Status: DC
  Filled 2023-10-16: qty 100

## 2023-10-16 MED ORDER — ACETAMINOPHEN 160 MG/5ML PO SOLN: 650.0000 mg | ORAL | Status: DC | PRN

## 2023-10-16 MED ORDER — MAGNESIUM SULFATE 4 GM/100ML IV SOLN
4.0000 g | Freq: Once | INTRAVENOUS | Status: AC
Start: 2023-10-16 — End: 2023-10-16
  Administered 2023-10-16: 4 g via INTRAVENOUS
  Filled 2023-10-16 (×2): qty 100

## 2023-10-16 MED ORDER — POTASSIUM CHLORIDE 10 MEQ/100ML IV SOLN
10.0000 meq | INTRAVENOUS | Status: AC
  Administered 2023-10-16 (×4): 10 meq via INTRAVENOUS
  Filled 2023-10-16: qty 100

## 2023-10-16 MED ORDER — POTASSIUM CHLORIDE CRYS ER 20 MEQ PO TBCR
40.0000 meq | EXTENDED_RELEASE_TABLET | Freq: Once | ORAL | Status: AC
  Administered 2023-10-16: 40 meq via ORAL
  Filled 2023-10-16: qty 2

## 2023-10-16 MED ORDER — LABETALOL HCL 5 MG/ML IV SOLN: 10.0000 mg | INTRAVENOUS | Status: DC | PRN

## 2023-10-16 MED ORDER — MAGNESIUM SULFATE 2 GM/50ML IV SOLN: 2.0000 g | Freq: Once | INTRAVENOUS | Status: DC

## 2023-10-16 MED ORDER — PREDNISONE 50 MG PO TABS
50.0000 mg | ORAL_TABLET | Freq: Four times a day (QID) | ORAL | Status: AC
  Administered 2023-10-16: 50 mg via ORAL
  Filled 2023-10-16: qty 1

## 2023-10-16 MED ORDER — ONDANSETRON HCL 4 MG/2ML IJ SOLN: 4.0000 mg | Freq: Four times a day (QID) | INTRAMUSCULAR | Status: DC | PRN

## 2023-10-16 MED ORDER — ENOXAPARIN SODIUM 40 MG/0.4ML IJ SOSY
40.0000 mg | PREFILLED_SYRINGE | INTRAMUSCULAR | Status: DC
  Administered 2023-10-16 – 2023-10-19 (×4): 40 mg via SUBCUTANEOUS
  Filled 2023-10-16 (×4): qty 0.4

## 2023-10-16 MED ORDER — DIPHENHYDRAMINE HCL 25 MG PO CAPS: 50.0000 mg | ORAL_CAPSULE | Freq: Once | ORAL | Status: AC

## 2023-10-16 MED ORDER — PREDNISONE 50 MG PO TABS
50.0000 mg | ORAL_TABLET | Freq: Four times a day (QID) | ORAL | Status: AC
  Administered 2023-10-16 – 2023-10-17 (×3): 50 mg via ORAL
  Filled 2023-10-16 (×3): qty 1

## 2023-10-16 MED ORDER — LEVETIRACETAM IN NACL 1000 MG/100ML IV SOLN
1000.0000 mg | Freq: Once | INTRAVENOUS | Status: AC
  Administered 2023-10-16: 1000 mg via INTRAVENOUS
  Filled 2023-10-16: qty 100

## 2023-10-16 MED ORDER — MAGNESIUM SULFATE 4 GM/100ML IV SOLN
4.0000 g | Freq: Once | INTRAVENOUS | Status: AC
Start: 2023-10-16 — End: 2023-10-16
  Administered 2023-10-16: 4 g via INTRAVENOUS
  Filled 2023-10-16: qty 100

## 2023-10-16 MED ORDER — SODIUM CHLORIDE 0.9 % IV SOLN
150.0000 mg | Freq: Once | INTRAVENOUS | Status: AC
  Administered 2023-10-16: 150 mg via INTRAVENOUS
  Filled 2023-10-16: qty 15

## 2023-10-16 MED ORDER — LORAZEPAM 2 MG/ML IJ SOLN
1.0000 mg | Freq: Once | INTRAMUSCULAR | Status: AC
  Administered 2023-10-16: 1 mg via INTRAVENOUS
  Filled 2023-10-16: qty 1

## 2023-10-16 MED ORDER — ACETAMINOPHEN 325 MG PO TABS
650.0000 mg | ORAL_TABLET | ORAL | Status: DC | PRN
  Administered 2023-10-18 – 2023-10-19 (×4): 650 mg via ORAL
  Filled 2023-10-16 (×4): qty 2

## 2023-10-16 MED ORDER — LORAZEPAM 2 MG/ML IJ SOLN
1.0000 mg | INTRAMUSCULAR | Status: AC
  Administered 2023-10-16: 1 mg via INTRAVENOUS
  Filled 2023-10-16: qty 1

## 2023-10-16 MED ORDER — LORAZEPAM 2 MG/ML IJ SOLN: 1.0000 mg | INTRAMUSCULAR | Status: DC | PRN

## 2023-10-16 MED ORDER — ACETAMINOPHEN 650 MG RE SUPP: 650.0000 mg | RECTAL | Status: DC | PRN

## 2023-10-16 MED ORDER — DIPHENHYDRAMINE HCL 50 MG/ML IJ SOLN
50.0000 mg | Freq: Once | INTRAMUSCULAR | Status: AC
  Administered 2023-10-17: 50 mg via INTRAVENOUS
  Filled 2023-10-16: qty 1

## 2023-10-16 MED ORDER — SODIUM CHLORIDE 0.9 % IV SOLN: INTRAVENOUS | Status: AC

## 2023-10-16 MED ORDER — STROKE: EARLY STAGES OF RECOVERY BOOK: Freq: Once | Status: AC

## 2023-10-16 MED ORDER — ONDANSETRON HCL 4 MG PO TABS: 4.0000 mg | ORAL_TABLET | Freq: Four times a day (QID) | ORAL | Status: DC | PRN

## 2023-10-16 NOTE — Assessment & Plan Note (Signed)
Statin

## 2023-10-16 NOTE — Progress Notes (Signed)
*  PRELIMINARY RESULTS* Echocardiogram 2D Echocardiogram has been performed.  Anne Phillips 10/16/2023, 3:52 PM

## 2023-10-16 NOTE — ED Provider Notes (Signed)
[facility] Provider Note    Event Date/Time   First MD Initiated Contact with Patient 10/16/23 [phone]     (approximate)   History   Chief Complaint Seizures   HPI  Anne Phillips is a 73 y.o. female with past medical history of hypertension, hyperlipidemia, CAD, CHF, CKD, and seizures who presents to the ED complaining of seizures.  Patient reports that she has been having seizure episodes intermittently for the past 2 days.  Patient is a very poor historian, but reports she has "grand mall" seizures typically, has not been taking her Keppra or Vimpat for at least a month.  She states that primarily her left arm will shake and spasm, but also will occasionally have shaking in her other extremities at times.  She states that in the past she has lost consciousness with these episodes, but she has not had any LOC in the past 2 days.  She currently states that she feels like she has ongoing seizure activity in her left arm, which she is having trouble controlling.  She does not know who her neurologist is, states she has not seen anyone in multiple years.  She has chronic swelling in her left arm following surgery for breast cancer years ago, denies any pain in the arm.      Physical Exam   Triage Vital Signs: ED Triage Vitals  Encounter Vitals Group     BP 10/16/23 0700 (!) 168/96     Systolic BP Percentile --      Diastolic BP Percentile --      Pulse Rate 10/16/23 0700 88     Resp 10/16/23 0700 18     Temp 10/16/23 0701 98.3 F (36.8 C)     Temp Source 10/16/23 0701 Oral     SpO2 10/16/23 0700 95 %     Weight 10/16/23 0701 130 lb 1.1 oz (59 kg)     Height 10/16/23 0701 5' (1.524 m)     Head Circumference --      Peak Flow --      Pain Score 10/16/23 0701 0     Pain Loc --      Pain Education --      Exclude from Growth Chart --     Most recent vital signs: Vitals:   10/16/23 0700 10/16/23 0701  BP: (!) 168/96   Pulse: 88   Resp: 18   Temp:   98.3 F (36.8 C)  SpO2: 95%     Constitutional: Alert and oriented. Eyes: Conjunctivae are normal. Head: Atraumatic. Nose: No congestion/rhinnorhea. Mouth/Throat: Mucous membranes are moist.  Cardiovascular: Normal rate, regular rhythm. Grossly normal heart sounds.  2+ radial pulses bilaterally. Respiratory: Normal respiratory effort.  No retractions. Lungs CTAB. Gastrointestinal: Soft and nontender. No distention. Musculoskeletal: No lower extremity tenderness nor edema.  Neurologic:  Normal speech and language.  Repetitive spastic movement of left arm with flexion at the wrist, able to lift arm at the shoulder.  Strength 5 out of 5 in right upper and bilateral lower extremities with no abnormal movements in these extremities.    ED Results / Procedures / Treatments   Labs (all labs ordered are listed, but only abnormal results are displayed) Labs Reviewed  CBC WITH DIFFERENTIAL/PLATELET - Abnormal; Notable for the following components:      Result Value   RBC 3.78 (*)    Hemoglobin 11.0 (*)    HCT 33.2 (*)    Lymphs Abs 0.5 (*)  All other components within normal limits  COMPREHENSIVE METABOLIC PANEL - Abnormal; Notable for the following components:   Sodium 133 (*)    Potassium 2.3 (*)    Chloride 89 (*)    Glucose, Bld 101 (*)    Creatinine, Ser 1.27 (*)    Calcium 7.5 (*)    GFR, Estimated 45 (*)    All other components within normal limits  MAGNESIUM - Abnormal; Notable for the following components:   Magnesium 1.0 (*)    All other components within normal limits  CBG MONITORING, ED - Abnormal; Notable for the following components:   Glucose-Capillary 115 (*)    All other components within normal limits  LEVETIRACETAM LEVEL  LACOSAMIDE     EKG  ED ECG REPORT I, Chesley Noon, the attending physician, personally viewed and interpreted this ECG.   Date: 10/16/2023  EKG Time: 7:56  Rate: 97  Rhythm: normal sinus rhythm  Axis: LAD  Intervals: Prolonged  QT  ST&T Change: None  RADIOLOGY CT head reviewed and interpreted by me with no hemorrhage or midline shift.  PROCEDURES:  Critical Care performed: No  Procedures   MEDICATIONS ORDERED IN ED: Medications  potassium chloride 10 mEq in 100 mL IVPB (has no administration in time range)  lacosamide (VIMPAT) 150 mg in sodium chloride 0.9 % 25 mL IVPB (150 mg Intravenous New Bag/Given 10/16/23 0904)  magnesium sulfate IVPB 2 g 50 mL (has no administration in time range)  potassium chloride SA (KLOR-CON M) CR tablet 40 mEq (40 mEq Oral Given 10/16/23 0833)  LORazepam (ATIVAN) injection 1 mg (1 mg Intravenous Given 10/16/23 0832)  levETIRAcetam (KEPPRA) IVPB 1000 mg/100 mL premix (0 mg Intravenous Stopped 10/16/23 0902)     IMPRESSION / MDM / ASSESSMENT AND PLAN / ED COURSE  I reviewed the triage vital signs and the nursing notes.                              73 y.o. female with past medical history of hypertension, hyperlipidemia, CAD, CHF, CKD, and seizures who presents to the ED complaining of seizure episodes intermittently for the past 2 days.  Patient's presentation is most consistent with acute presentation with potential threat to life or bodily function.  Differential diagnosis includes, but is not limited to, partial seizure, status epilepticus, grand mal seizure, stroke, anemia, electrolyte abnormality, AKI, muscle spasm.  Patient nontoxic-appearing and in no acute distress, vital signs are unremarkable.  She does report that she feels like she has ongoing seizure activity with spastic movement of her left lower extremity which could represent partial seizure.  We will give dose of IV Ativan, also give IV Keppra and Vimpat, as she has not been taking these medications.  She has chronic swelling of her left upper extremity, reports history of mastectomy and this is likely lymphedema due to that.  No findings concerning for infectious process, CT head pending.  Labs without  significant anemia or leukocytosis, she does have severe hypokalemia 2.3 with prolonged QT noted on EKG.  We will replete potassium, also add on magnesium level.  Renal function similar to previous, LFTs are unremarkable.  Keppra and Vimpat levels are pending at this time, but patient admits to noncompliance with these medications.  Case discussed with Dr. Selina Cooley of neurology, who will evaluate the patient.  CT head with new but chronic appearing infarct in the right occipital lobe, no other acute findings noted.  Additional labs show hypomagnesemia, which we will replete.  Spastic movements of left upper extremity appear to have slowed following Keppra, Vimpat, and Ativan.  Given electrolyte derangements and questionable seizure activity, case discussed with hospitalist for admission.      FINAL CLINICAL IMPRESSION(S) / ED DIAGNOSES   Final diagnoses:  Seizure-like activity (HCC)  Hypokalemia  Hypomagnesemia     Rx / DC Orders   ED Discharge Orders     None        Note:  This document was prepared using Dragon voice recognition software and may include unintentional dictation errors.   Chesley Noon, MD 10/16/23 [phone]

## 2023-10-16 NOTE — Assessment & Plan Note (Addendum)
Baseline CAD with RCA stent and known CTO RCA from cath 2021  No active CP  EKG with atrial fibrillation followed by some atrial premature complexes

## 2023-10-16 NOTE — Assessment & Plan Note (Signed)
Cr 1.27 w/ GFR in 40s  Appears at baseline though mildly dry  IVF hydration  Monitor  Hold nephrotoxic agents

## 2023-10-16 NOTE — H&P (Signed)
History and Physical    Patient: Anne Phillips WUJ:[id number] DOB: September 22, 1950 DOA: 10/16/2023 DOS: the patient was seen and examined on 10/16/2023 PCP: Gillis Santa, MD  Patient coming from: Home  Chief Complaint:  Chief Complaint  Patient presents with   Seizures   HPI: Anne Phillips is a 73 y.o. female with medical history significant of seizure disorder, htn, CAD, stage 3 CKD, HLD, ? Cva presenting w/ seizure, encephalopathy, suspected cva. Limited history in setting of encephalopathy. Per report, pt with seizure event at home. Pt had continue GTC seizure at home. Episode happened while patient was laying down per report.  Pt noted to not be compliant with home seizure  medications per report.Pt with episode of generalized shaking while in ER. Was given IV keppra, lacosamide and ativan for treatment. Pt subsequently became significantly lethargic thereafter. No reports or chest pain, SOB, nausea or vomiting. Noted L arm pain and swelling prior to event.  Presented to ER afebrile, hemodynamically stable. WBC 7.5, hgb 11, plt 184, K 2.3, Cr 1.27. Mg 1. CT head w/ new chronic appearing but age indeterminant R occipital lobe infarct.  Review of Systems: As mentioned in the history of present illness. All other systems reviewed and are negative. Past Medical History:  Diagnosis Date   Cancer [facility])    Coronary artery disease    Hyperlipidemia    Hypertension    Seizures (HCC)    Slurred speech 10/11/2022   Thyroid disease    Past Surgical History:  Procedure Laterality Date   MASTECTOMY Left    Social History:  reports that she has been smoking cigarettes. She has never used smokeless tobacco. She reports that she does not drink alcohol and does not use drugs.  Allergies  Allergen Reactions   Iodinated Contrast Media Hives and Other (See Comments)    Rash  Other Reaction(s): Other (See Comments)  Rash   Rash   Rash    Rash  Rash   Other Hives     Metal-reaction=rash Metal-reaction=rash     Family History  Problem Relation Age of Onset   Hypertension Other     Prior to Admission medications   Medication Sig Start Date End Date Taking? Authorizing Provider  acetaminophen (TYLENOL) 325 MG tablet Take 2 tablets (650 mg total) by mouth every 6 (six) hours as needed for mild pain (or Fever >/= 101). 10/05/20   Zannie Cove, MD  ammonium lactate (AMLACTIN) 12 % cream Apply 1 Application topically 2 (two) times daily.    [provider]  aspirin EC 81 MG tablet Take 1 tablet (81 mg total) by mouth daily. Swallow whole. 10/18/22   Delfino Lovett, MD  atorvastatin (LIPITOR) 40 MG tablet Take 40 mg by mouth daily.    [provider]  calcium carbonate (TUMS) 500 MG chewable tablet Chew 1 tablet (200 mg of elemental calcium total) by mouth 2 (two) times daily with a meal for 14 days. 08/04/23 10/19/23  Minna Antis, MD  carvedilol (COREG) 6.25 MG tablet Take 6.25 mg by mouth 2 (two) times daily.    [provider]  clonazePAM (KLONOPIN) 1 MG tablet Take 1 tablet (1 mg total) by mouth 2 (two) times daily. 11/06/22 02/04/23  Gillis Santa, MD  empagliflozin (JARDIANCE) 10 MG TABS tablet Take 10 mg by mouth daily.    [provider]  feeding supplement (ENSURE ENLIVE / ENSURE PLUS) LIQD Take 237 mLs by mouth 2 (two) times daily between meals. 10/17/22  Delfino Lovett, MD  gabapentin (NEURONTIN) 300 MG capsule Take 1 capsule (300 mg total) by mouth 2 (two) times daily. 10/05/20   Zannie Cove, MD  HYDROcodone-acetaminophen (NORCO/VICODIN) 5-325 MG tablet Take 1 tablet by mouth every 4 (four) hours as needed. 08/04/23   Minna Antis, MD  Lacosamide (VIMPAT) 150 MG TABS Take 1 tablet (150 mg total) by mouth in the morning and at bedtime. 08/06/21   Marrion Coy, MD  levETIRAcetam (KEPPRA) 250 MG tablet Take 1 tablet (250 mg total) by mouth at bedtime. Patient taking differently: Take 750 mg by mouth at bedtime.  11/06/22 11/06/23  Gillis Santa, MD  levETIRAcetam (KEPPRA) 500 MG tablet Take 1 tablet (500 mg total) by mouth 3 (three) times daily. Patient taking differently: Take 500 mg by mouth 2 (two) times daily. 11/06/22 11/06/23  Gillis Santa, MD  methimazole (TAPAZOLE) 5 MG tablet Take 1 tablet (5 mg total) by mouth daily. 11/07/22 11/07/23  Gillis Santa, MD  Multiple Vitamin (MULTIVITAMIN WITH MINERALS) TABS tablet Take 1 tablet by mouth daily. 10/18/22   Delfino Lovett, MD  nicotine (NICODERM CQ - DOSED IN MG/24 HOURS) 14 mg/24hr patch Place 1 patch (14 mg total) onto the skin daily. 10/18/22   Delfino Lovett, MD  pantoprazole (PROTONIX) 40 MG tablet Take 1 tablet (40 mg total) by mouth daily. 11/06/22 11/06/23  Gillis Santa, MD  tacrolimus (PROTOPIC) 0.1 % ointment Apply 1 Application topically daily as needed. 07/29/22   [provider]  vitamin B-12 1000 MCG tablet Take 1 tablet (1,000 mcg total) by mouth daily. 08/06/21   Marrion Coy, MD    Physical Exam: Vitals:   10/16/23 0700 10/16/23 0701  BP: (!) 168/96   Pulse: 88   Resp: 18   Temp:  98.3 F (36.8 C)  TempSrc:  Oral  SpO2: 95%   Weight:  59 kg  Height:  5' (1.524 m)   Physical Exam Constitutional:      Appearance: She is obese.     Comments: + lethargy at the bedside    HENT:     Head: Normocephalic and atraumatic.     Nose: Nose normal.     Mouth/Throat:     Mouth: Mucous membranes are dry.  Eyes:     Pupils: Pupils are equal, round, and reactive to light.  Cardiovascular:     Rate and Rhythm: Normal rate and regular rhythm.  Pulmonary:     Effort: Pulmonary effort is normal.  Abdominal:     General: Bowel sounds are normal.  Musculoskeletal:        General: Normal range of motion.  Skin:    General: Skin is warm.  Neurological:     General: No focal deficit present.  Psychiatric:        Mood and Affect: Mood normal.     Data Reviewed:  There are no new results to review at this time. CT Head Wo Contrast CLINICAL  DATA:  Seizure disorder, clinical change  EXAM: CT HEAD WITHOUT CONTRAST  TECHNIQUE: Contiguous axial images were obtained from the base of the skull through the vertex without intravenous contrast.  RADIATION DOSE REDUCTION: This exam was performed according to the departmental dose-optimization program which includes automated exposure control, adjustment of the mA and/or kV according to patient size and/or use of iterative reconstruction technique.  COMPARISON:  Head CT 12/31/2022  FINDINGS: Brain: No hemorrhage. No hydrocephalus. No extra-axial fluid collection. No mass effect. No mass lesion. Compared to prior exam there is  a new chronic appearing, but technically age indeterminate infarct right occipital lobe  Vascular: No hyperdense vessel or unexpected calcification.  Skull: Normal. Negative for fracture or focal lesion.  Sinuses/Orbits: No middle ear or mastoid effusion. Paranasal sinuses clear. Orbits are unremarkable  Other: None.  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 If there is clinical concern for an acute infarct, consider further evaluation with MRI.  Electronically Signed   By: Lorenza Cambridge M.D.   On: 10/16/2023 08:55   Assessment and Plan: Acute encephalopathy + generalized lethargy with concern for possible acute seizure-postictal S/p keppra, lacosamide and ativan  Will check ammonia level and UDS  No overt infectious etiology noted  CT head w/ ? Subacute vs. Chronic CVA  Monitor mentation w/ treatment  Follow     Stage 3b chronic kidney disease (HCC) Cr 1.27 w/ GFR in 40s  Appears at baseline though mildly dry  IVF hydration  Monitor  Hold nephrotoxic agents    Seizure disorder (HCC) ?acute seizure event- some concern for medication noncompliance  S/p keppra, lacosamide and IV ativan  MRI pending in setting of concern for ? CVA  EEG   Follow up neurology recommendations    Atrial fibrillation (HCC) EKG w/ atrial fibrillation, HR in 90s Appears to be new onset  Will get repeat EKG to confirm Cardiology consult as clinically indicated  2D ECHO ordered in setting of CVA evaluation   TIA (transient ischemic attack) CT head w/ concern for age indeterminate infarct right occipital lobe  Noted ? Seizure event  Will plan for formal CVA eval including MRI brain, CTA head and neck, 2D ECHO, risk stratification labs  Neurology consulted  Follow up formal recommendations   Coronary artery disease Baseline CAD with RCA stent and known CTO RCA from cath 2021  No active CP  Trop pending  EKG with atrial fibrillation  Trend trop  Follow closely  Dyslipidemia Statin   Hypertension Allow for permissive HTN in setting of CVA eval  Prn IV labetalol for SBP>220 or DBP>110       Advance Care Planning:   Code Status: Full Code   Consults: neurology, possibly cardiology pending repeat EKG   Family Communication: No family at the bedside   Severity of Illness: The appropriate patient status for this patient is INPATIENT. Inpatient status is judged to be reasonable and necessary in order to provide the required intensity of service to ensure the patient's safety. The patient's presenting symptoms, physical exam findings, and initial radiographic and laboratory data in the context of their chronic comorbidities is felt to place them at high risk for further clinical deterioration. Furthermore, it is not anticipated that the patient will be medically stable for discharge from the hospital within 2 midnights of admission.   * I certify that at the point of admission it is my clinical judgment that the patient will require inpatient hospital care spanning beyond 2 midnights from the point of admission due to high intensity of service, high risk for further deterioration and high frequency of surveillance  required.*  Author: Floydene Flock, MD 10/16/2023 10:51 AM  For on call review [email or web address].

## 2023-10-16 NOTE — Progress Notes (Signed)
OT Cancellation Note  Patient Details Name: Anne Phillips MRN: [id number] DOB: [birth year]   Cancelled Treatment:    Reason Eval/Treat Not Completed: Patient not medically ready. Orders received, chart reviewed. Pt pending further workup for potential CVA, awaiting MRI and CT ANGIO. OT will continue to follow, and eval when appropriate.  Trust Leh L. Liev [place]bank, OTR/L  10/16/23, 12:28 PM

## 2023-10-16 NOTE — Assessment & Plan Note (Addendum)
Blood pressure elevated.  Initially antihypertensives were held to rule out CVA, restarting home medications

## 2023-10-16 NOTE — Assessment & Plan Note (Addendum)
Patient had significant high Po kalemia and hypomagnesemia on admission.  Hypomagnesemia resolved. Potassium improved but still low at 3.2 -Replete potassium and monitor

## 2023-10-16 NOTE — Assessment & Plan Note (Addendum)
EKG w/ atrial fibrillation, HR in 90s Appears to be new onset  Repeat EKG with premature atrial complexes Echocardiogram with normal EF, grade 2 diastolic dysfunction and mild to moderate biatrial dilatation. -Continue to monitor -Need outpatient cardiology follow-up

## 2023-10-16 NOTE — Progress Notes (Signed)
PT Cancellation Note  Patient Details Name: Anne Phillips MRN: [id number] DOB: May 27, 1951   Cancelled Treatment:    Reason Eval/Treat Not Completed: Medical issues which prohibited therapy. Consult received and chart reviewed. Patient noted with critically low K+, confirmed with MD to hold exertional activity at this time.  Will continue efforts next date pending medical stability and appropriateness.  Olga Coaster PT, DPT 1:28 PM,10/16/23

## 2023-10-16 NOTE — Progress Notes (Signed)
Eeg done

## 2023-10-16 NOTE — ED Notes (Signed)
Pt brought in by [facility] from home with co seizures per brother. Per EMS she was awake and alert when they arrived. Pt is on keppra but she does not take it. PT co left arm pain and swelling. HX MI, CVA, HTN.

## 2023-10-16 NOTE — Assessment & Plan Note (Addendum)
CT head w/ concern for age indeterminate infarct right occipital lobe , MRI was negative for any acute abnormality, remote PCA infarct. No other acute concern.  Did completed stroke workup

## 2023-10-16 NOTE — Assessment & Plan Note (Addendum)
Likely breakthrough seizure secondary to being noncompliant, per patient she could not get her medications, unable to explain why S/p keppra, lacosamide and IV ativan  MRI and EEG without any significant abnormality Pending Vimpat and Keppra levels Neurology is recommending just restarting home medication. Patient need meds to bed before leaving or if we can get hold of any family member who can provide medication.

## 2023-10-16 NOTE — ED Triage Notes (Addendum)
Patient from home states she had a continuous seizures tonic clonic, patient is recalling entire event to me stating "it would just last a couple of seconds and then go away". Does have a hx of seizure, is supposed to be on medications for same but ran out. She was laying down when they occurred. No trauma noted. Patient is Aox4.  Patient is currently jerking in triage, but completely alert and talking to this RN. She states she is seizing.

## 2023-10-16 NOTE — Assessment & Plan Note (Addendum)
+   generalized lethargy with concern for possible acute seizure-postictal Resolved and patient now close to baseline

## 2023-10-16 NOTE — Progress Notes (Signed)
SLP Cancellation Note  Patient Details Name: [name] MRN: [id number] DOB: [birth year]   Cancelled treatment:       Reason Eval/Treat Not Completed: SLP screened, no needs identified, will sign off  Pt on phone with MRI. Able to communicate functionally. Pt's nurse reports that she consumed some small sips of liquids without overt s/s of aspiration. At this time, skilled ST services at not indicated.    Shalona Harbour B. Dreama Saa, M.S., CCC-SLP, CBIS Speech-Language Pathologist Certified Brain Injury Specialist [facility] [phone] Ascom [phone] Fax [phone]    Reuel Derby 10/16/2023, 11:17 AM

## 2023-10-16 NOTE — Progress Notes (Signed)
PHARMACY CONSULT NOTE - ELECTROLYTES  Pharmacy Consult for Electrolyte Monitoring and Replacement   Recent Labs: Height: 5' (152.4 cm) Weight: 59 kg (130 lb 1.1 oz) IBW/kg (Calculated) : 45.5 Estimated Creatinine Clearance: 32.6 mL/min (A) (by C-G formula based on SCr of 1.27 mg/dL (H)). Potassium (mmol/L)  Date Value  10/16/2023 2.3 (LL)   Magnesium (mg/dL)  Date Value  35/57/3220 1.0 (L)   Calcium (mg/dL)  Date Value  25/42/7062 7.5 (L)   Albumin (g/dL)  Date Value  37/62/8315 3.7   Phosphorus (mg/dL)  Date Value  17/61/6073 3.0   Sodium (mmol/L)  Date Value  10/16/2023 133 (L)   Assessment  Gabrielle FRANCHESCA VENEZIANO is a 73 y.o. female presenting with intermittent seizure episiodes. PMH significant for hypertension, hyperlipidemia, CAD, CHF, CKD, and seizures. Pharmacy has been consulted to monitor and replace electrolytes.  Diet: NPO MIVF: NS @ 40 mL/hr Pertinent medications: N/A  Goal of Therapy: Electrolytes WNL  Plan:  K 2.3: Provider ordered KCL 40 mEq PO x1 + KCL 10 mEq IV x4 this AM No additional replacement warranted Re-check potassium level ~2 hours after last dose this evening Mg 1.0 Give magnesium sulfate 4 g IV x1 this morning Give additional magnesium sulfate 4 g IV x1 this evening Re-check magnesium level with AM labs Check BMP, Mg, Phos with AM labs  Thank you for allowing pharmacy to be a part of this patient's care.  Rockwell Alexandria, PharmD Clinical Pharmacist 10/16/2023 10:29 AM

## 2023-10-17 ENCOUNTER — Observation Stay: Payer: Medicare HMO

## 2023-10-17 DIAGNOSIS — G40909 Epilepsy, unspecified, not intractable, without status epilepticus: Secondary | ICD-10-CM | POA: Diagnosis not present

## 2023-10-17 DIAGNOSIS — G9340 Encephalopathy, unspecified: Secondary | ICD-10-CM | POA: Diagnosis not present

## 2023-10-17 DIAGNOSIS — N1832 Chronic kidney disease, stage 3b: Secondary | ICD-10-CM

## 2023-10-17 DIAGNOSIS — E876 Hypokalemia: Secondary | ICD-10-CM

## 2023-10-17 DIAGNOSIS — G459 Transient cerebral ischemic attack, unspecified: Secondary | ICD-10-CM

## 2023-10-17 DIAGNOSIS — E0590 Thyrotoxicosis, unspecified without thyrotoxic crisis or storm: Secondary | ICD-10-CM

## 2023-10-17 DIAGNOSIS — I10 Essential (primary) hypertension: Secondary | ICD-10-CM

## 2023-10-17 DIAGNOSIS — R569 Unspecified convulsions: Secondary | ICD-10-CM | POA: Diagnosis not present

## 2023-10-17 LAB — BASIC METABOLIC PANEL
Anion gap: 12 (ref 5–15)
BUN: 18 mg/dL (ref 8–23)
CO2: 29 mmol/L (ref 22–32)
Calcium: 7.9 mg/dL — ABNORMAL LOW (ref 8.9–10.3)
Chloride: 92 mmol/L — ABNORMAL LOW (ref 98–111)
Creatinine, Ser: 1.12 mg/dL — ABNORMAL HIGH (ref 0.44–1.00)
GFR, Estimated: 53 mL/min — ABNORMAL LOW (ref >60)
Glucose, Bld: 155 mg/dL — ABNORMAL HIGH (ref 70–99)
Potassium: 3.2 mmol/L — ABNORMAL LOW (ref 3.5–5.1)
Sodium: 133 mmol/L — ABNORMAL LOW (ref 135–145)

## 2023-10-17 LAB — LIPID PANEL
Cholesterol: 80 mg/dL (ref 0–200)
HDL: 28 mg/dL — ABNORMAL LOW (ref >40)
LDL Cholesterol: 36 mg/dL (ref 0–99)
Total CHOL/HDL Ratio: 2.9 {ratio}
Triglycerides: 81 mg/dL (ref <150)
VLDL: 16 mg/dL (ref 0–40)

## 2023-10-17 LAB — T4, FREE: Free T4: 1.46 ng/dL — ABNORMAL HIGH (ref 0.61–1.12)

## 2023-10-17 LAB — HEMOGLOBIN A1C
Hgb A1c MFr Bld: 4.8 % (ref 4.8–5.6)
Mean Plasma Glucose: 91.06 mg/dL

## 2023-10-17 LAB — TSH: TSH: 0.256 u[IU]/mL — ABNORMAL LOW (ref 0.350–4.500)

## 2023-10-17 LAB — TROPONIN I (HIGH SENSITIVITY): Troponin I (High Sensitivity): 45 ng/L — ABNORMAL HIGH (ref <18)

## 2023-10-17 LAB — POTASSIUM: Potassium: 3.2 mmol/L — ABNORMAL LOW (ref 3.5–5.1)

## 2023-10-17 LAB — MAGNESIUM: Magnesium: 3 mg/dL — ABNORMAL HIGH (ref 1.7–2.4)

## 2023-10-17 LAB — PHOSPHORUS: Phosphorus: 3.4 mg/dL (ref 2.5–4.6)

## 2023-10-17 MED ORDER — CARVEDILOL 6.25 MG PO TABS
6.2500 mg | ORAL_TABLET | Freq: Two times a day (BID) | ORAL | Status: DC
  Administered 2023-10-17 – 2023-10-20 (×6): 6.25 mg via ORAL
  Filled 2023-10-17 (×6): qty 1

## 2023-10-17 MED ORDER — LEVETIRACETAM 500 MG PO TABS
500.0000 mg | ORAL_TABLET | Freq: Two times a day (BID) | ORAL | Status: DC
  Administered 2023-10-17 – 2023-10-20 (×7): 500 mg via ORAL
  Filled 2023-10-17 (×7): qty 1

## 2023-10-17 MED ORDER — EMPAGLIFLOZIN 10 MG PO TABS
10.0000 mg | ORAL_TABLET | Freq: Every day | ORAL | Status: DC
  Administered 2023-10-17 – 2023-10-20 (×4): 10 mg via ORAL
  Filled 2023-10-17 (×4): qty 1

## 2023-10-17 MED ORDER — LOSARTAN POTASSIUM 50 MG PO TABS
100.0000 mg | ORAL_TABLET | Freq: Every day | ORAL | Status: DC
  Administered 2023-10-17 – 2023-10-20 (×4): 100 mg via ORAL
  Filled 2023-10-17 (×4): qty 2

## 2023-10-17 MED ORDER — LACOSAMIDE 150 MG PO TABS
150.0000 mg | ORAL_TABLET | Freq: Two times a day (BID) | ORAL | Status: DC
  Filled 2023-10-17: qty 1

## 2023-10-17 MED ORDER — ASPIRIN 81 MG PO TBEC
81.0000 mg | DELAYED_RELEASE_TABLET | Freq: Every day | ORAL | Status: DC
  Administered 2023-10-17 – 2023-10-20 (×4): 81 mg via ORAL
  Filled 2023-10-17 (×4): qty 1

## 2023-10-17 MED ORDER — ATORVASTATIN CALCIUM 20 MG PO TABS
40.0000 mg | ORAL_TABLET | Freq: Every day | ORAL | Status: DC
Start: 2023-10-17 — End: 2023-10-20
  Administered 2023-10-17 – 2023-10-19 (×3): 40 mg via ORAL
  Filled 2023-10-17 (×3): qty 2

## 2023-10-17 MED ORDER — GABAPENTIN 300 MG PO CAPS
300.0000 mg | ORAL_CAPSULE | Freq: Two times a day (BID) | ORAL | Status: DC
  Administered 2023-10-17 – 2023-10-20 (×6): 300 mg via ORAL
  Filled 2023-10-17 (×6): qty 1

## 2023-10-17 MED ORDER — LACOSAMIDE 50 MG PO TABS
150.0000 mg | ORAL_TABLET | Freq: Two times a day (BID) | ORAL | Status: DC
  Administered 2023-10-17 – 2023-10-20 (×7): 150 mg via ORAL
  Filled 2023-10-17 (×7): qty 3

## 2023-10-17 MED ORDER — VITAMIN B-12 1000 MCG PO TABS
1000.0000 ug | ORAL_TABLET | Freq: Every day | ORAL | Status: DC
Start: 2023-10-17 — End: 2023-10-20
  Administered 2023-10-17 – 2023-10-20 (×4): 1000 ug via ORAL
  Filled 2023-10-17 (×4): qty 1

## 2023-10-17 MED ORDER — IOHEXOL 350 MG/ML SOLN
65.0000 mL | Freq: Once | INTRAVENOUS | Status: AC | PRN
  Administered 2023-10-17: 65 mL via INTRAVENOUS

## 2023-10-17 MED ORDER — SPIRONOLACTONE 12.5 MG HALF TABLET
12.5000 mg | ORAL_TABLET | Freq: Every day | ORAL | Status: DC
  Administered 2023-10-17: 12.5 mg via ORAL
  Filled 2023-10-17 (×2): qty 1

## 2023-10-17 MED ORDER — POTASSIUM CHLORIDE CRYS ER 20 MEQ PO TBCR
40.0000 meq | EXTENDED_RELEASE_TABLET | Freq: Two times a day (BID) | ORAL | Status: AC
Start: 2023-10-17 — End: 2023-10-17
  Administered 2023-10-17 (×2): 40 meq via ORAL
  Filled 2023-10-17 (×2): qty 2

## 2023-10-17 MED ORDER — ENSURE ENLIVE PO LIQD
237.0000 mL | Freq: Two times a day (BID) | ORAL | Status: DC
  Administered 2023-10-17 – 2023-10-20 (×6): 237 mL via ORAL

## 2023-10-17 MED ORDER — POTASSIUM CHLORIDE 10 MEQ/100ML IV SOLN
10.0000 meq | INTRAVENOUS | Status: AC
  Administered 2023-10-17 (×2): 10 meq via INTRAVENOUS
  Filled 2023-10-17: qty 100

## 2023-10-17 MED ORDER — METHIMAZOLE 5 MG PO TABS
5.0000 mg | ORAL_TABLET | Freq: Every day | ORAL | Status: DC
  Administered 2023-10-17 – 2023-10-20 (×4): 5 mg via ORAL
  Filled 2023-10-17 (×4): qty 1

## 2023-10-17 MED ORDER — CALCIUM CARBONATE ANTACID 500 MG PO CHEW
1.0000 | CHEWABLE_TABLET | Freq: Two times a day (BID) | ORAL | Status: DC
  Administered 2023-10-17 – 2023-10-20 (×6): 200 mg via ORAL
  Filled 2023-10-17 (×6): qty 1

## 2023-10-17 NOTE — Hospital Course (Addendum)
Taken from H&P.  Anne Phillips is a 73 y.o. female with medical history significant of seizure disorder, htn, CAD, stage 3 CKD, HLD, ? Cva presenting w/ seizure, encephalopathy, suspected cva. Limited history in setting of encephalopathy. Per report, pt with seizure event at home.  Per report patient was compliant with his seizure medications. Patient had a witnessed tonic-clonic seizure in ER, was given IV keppra, lacosamide and ativan for treatment.   Otherwise stable vitals, CT head with age-indeterminate right occipital lobe infarct.  EEG and MRI ordered.  2/15: Blood pressure elevated at 182/90.  MRI with remote PCA infarct, no acute abnormality.  Restarting home losartan.  Mild hyponatremia and hypokalemia, magnesium improved to 3, troponin 48>>45-likely secondary to demand ischemia, lipid panel, A1c of 4.8,  Per pharmacy review patient was not taking her medications including antihypertensive, antiseizure and methimazole. Keppra and Vimpat levels are pending. TSH low with mildly elevated free T4/patient likely also not taking her methimazole. Restarted home antihypertensives, along with other medications. Patient is not very cooperative and keeps saying that she cannot get her medications.  Unable to reach any family member despite multiple attempts. EEG without any significant abnormality or seizure-like activity. Neurology is just recommending likely a breakthrough seizure and restarting home medications.  Patient is medically stable but likely have unsafe discharge disposition at this time.  We will not be able to get her meds to bed as [facility] pharmacy is closed over the weekend.

## 2023-10-17 NOTE — TOC CM/SW Note (Signed)
Per MD, patient is ready for discharge but barriers include confirming patient has support from family and ability to obtain medications.  Call placed to sister, no answer, unable to leave message as mailbox is full.  Will attempt again tomorrow.   Rodney Langton, RN, MSN, CCM TOC RNCM

## 2023-10-17 NOTE — Progress Notes (Signed)
Progress Note   Patient: Anne Phillips ZOX:[id number] DOB: 1950-10-10 DOA: 10/16/2023     0 DOS: the patient was seen and examined on 10/17/2023   Brief hospital course: Taken from H&P.  DESERE GWIN is a 73 y.o. female with medical history significant of seizure disorder, htn, CAD, stage 3 CKD, HLD, ? Cva presenting w/ seizure, encephalopathy, suspected cva. Limited history in setting of encephalopathy. Per report, pt with seizure event at home.  Per report patient was compliant with his seizure medications. Patient had a witnessed tonic-clonic seizure in ER, was given IV keppra, lacosamide and ativan for treatment.   Otherwise stable vitals, CT head with age-indeterminate right occipital lobe infarct.  EEG and MRI ordered.  2/15: Blood pressure elevated at 182/90.  MRI with remote PCA infarct, no acute abnormality.  Restarting home losartan.  Mild hyponatremia and hypokalemia, magnesium improved to 3, troponin 48>>45-likely secondary to demand ischemia, lipid panel, A1c of 4.8,  Per pharmacy review patient was not taking her medications including antihypertensive, antiseizure and methimazole. Keppra and Vimpat levels are pending. TSH low with mildly elevated free T4/patient likely also not taking her methimazole. Restarted home antihypertensives, along with other medications. Patient is not very cooperative and keeps saying that she cannot get her medications.  Unable to reach any family member despite multiple attempts. EEG without any significant abnormality or seizure-like activity. Neurology is just recommending likely a breakthrough seizure and restarting home medications.  Patient is medically stable but likely have unsafe discharge disposition at this time.  We will not be able to get her meds to bed as [facility] pharmacy is closed over the weekend.  Assessment and Plan: * Seizure disorder (HCC) Likely breakthrough seizure secondary to being noncompliant, per patient she could  not get her medications, unable to explain why S/p keppra, lacosamide and IV ativan  MRI and EEG without any significant abnormality Pending Vimpat and Keppra levels Neurology is recommending just restarting home medication. Patient need meds to bed before leaving or if we can get hold of any family member who can provide medication.   Acute encephalopathy + generalized lethargy with concern for possible acute seizure-postictal Resolved and patient now close to baseline    Stage 3b chronic kidney disease (HCC) Cr 1.27 w/ GFR in 40s  Appears at baseline though mildly dry  IVF hydration  Monitor  Hold nephrotoxic agents    Atrial fibrillation (HCC) EKG w/ atrial fibrillation, HR in 90s Appears to be new onset  Repeat EKG with premature atrial complexes Echocardiogram with normal EF, grade 2 diastolic dysfunction and mild to moderate biatrial dilatation. -Continue to monitor -Need outpatient cardiology follow-up  TIA (transient ischemic attack) CT head w/ concern for age indeterminate infarct right occipital lobe , MRI was negative for any acute abnormality, remote PCA infarct. No other acute concern.  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 Elevated free T4 at 1.46 with low TSH. -Restart home methimazole  Hypokalemia Patient had significant high Po kalemia and hypomagnesemia on admission.  Hypomagnesemia resolved. Potassium improved but still low at 3.2 -Replete potassium and monitor   Hypertension Blood pressure elevated.  Initially antihypertensives were held to rule out CVA, restarting home medications  Dyslipidemia Statin    Subjective: Patient was  seen and examined today.  No new concern but she was keeps saying  that I do not have any medications to take.  Unable to explain whether she does not have any prescription or she cannot afford it versus nobody picked it up from pharmacy.  Per pharmacy review patient never picked up medications.  Physical Exam: Vitals:   10/16/23 2100 10/17/23 0336 10/17/23 0900 10/17/23 1135  BP: (!) 167/91 (!) 182/90 (!) 179/119 (!) 169/79  Pulse: 74 73 83 81  Resp: 16 17 20 18   Temp:   98.5 F (36.9 C) 98.3 F (36.8 C)  TempSrc:   Oral   SpO2: 98% 97% 100% 100%  Weight: 50.8 kg     Height: 5\' 3"  (1.6 m)      General.  Frail and malnourished elderly lady, in no acute distress. Pulmonary.  Lungs clear bilaterally, normal respiratory effort. CV.  Regular rate and rhythm, no JVD, rub or murmur. Abdomen.  Soft, nontender, nondistended, BS positive. CNS.  Alert and oriented .  No focal neurologic deficit. Extremities.  No edema,  pulses intact and symmetrical.  L UE lymphedema   Data Reviewed: Prior data reviewed  Family Communication: Tried calling to sisters listed in her chart but unable to get hold of any family member  Disposition: Status is: Observation The patient remains OBS appropriate and will d/c before 2 midnights.  Planned Discharge Destination: Home with Home Health  DVT prophylaxis.  Lovenox Time spent: 50 minutes  This record has been created using Conservation officer, historic buildings. Errors have been sought and corrected,but may not always be located. Such creation errors do not reflect on the standard of care.   Author: Arnetha Courser, MD 10/17/2023 3:55 PM  For on call review [email or web address].

## 2023-10-17 NOTE — Progress Notes (Signed)
PT Cancellation Note  Patient Details Name: Anne Phillips MRN: [id number] DOB: 1951/07/11   Cancelled Treatment:    Reason Eval/Treat Not Completed: Patient at procedure or test/unavailable Patient off floor x 2 attempts. Will re-attempt later.  Justyn Boyson 10/17/2023, 2:10 PM

## 2023-10-17 NOTE — Progress Notes (Signed)
Mobility Specialist - Progress Note     10/17/23 1230  Mobility  Activity Stood at bedside;Transferred to/from [facility]  Level of Assistance Contact guard assist, steadying assist  Assistive Device Front wheel walker  Distance Ambulated (ft) 2 ft  Range of Motion/Exercises Active  Activity Response Tolerated well  Mobility Referral Yes  Mobility visit 1 Mobility  Mobility Specialist Start Time (ACUTE ONLY) 1215  Mobility Specialist Stop Time (ACUTE ONLY) 1230  Mobility Specialist Time Calculation (min) (ACUTE ONLY) 15 min   Author responding to bed alarm Pt sitting EOB adamantly requesting to go to the bathroom (Pt on RA upon entry). Pt STS MinA to stand and CGA to transfer to [facility]. Pt cleaned up and gown changed. Pt returned to sititng EOB to eat lunch. Pt left with needs in reach   [facility] Mobility Specialist 10/17/23, 12:53 PM

## 2023-10-17 NOTE — Assessment & Plan Note (Signed)
Methimazole was listed in her chart but patient was noncompliant. Elevated free T4 at 1.46 with low TSH. -Restart home methimazole

## 2023-10-17 NOTE — Evaluation (Signed)
Physical Therapy Evaluation Patient Details Name: Anne Phillips MRN: [id number] DOB: May 07, 1951 Today's Date: 10/17/2023  History of Present Illness  Anne Phillips is a 73 y.o. female with medical history significant of seizure disorder, htn, CAD, stage 3 CKD, HLD, ? Cva presenting w/ seizure, encephalopathy, suspected cva. Limited history in setting of encephalopathy.  Clinical Impression  Patient received in bed. She is agitated, not very cooperative during assessment. Patient is mod I with bed mobility. Transferred to [facility] with supervision. Not willing to ambulate, although ambulated with NT to bathroom. She told me she doesn't walk. Patient appears to be at baseline and is not cooperative with PT. Will sign off at this time.           If plan is discharge home, recommend the following: A little help with walking and/or transfers;Help with stairs or ramp for entrancenone   Can travel by private vehicle    yes    Equipment Recommendations None recommended by PTN/a  Recommendations for Other Services       Functional Status Assessment Patient has not had a recent decline in their functional status     Precautions / Restrictions Precautions Precautions: Fall Restrictions Weight Bearing Restrictions Per Provider Order: No      Mobility  Bed Mobility Overal bed mobility: Modified Independent                  Transfers Overall transfer level: Modified independent Equipment used: None               General transfer comment: transferred to [facility] and back to bed. She told me she uses wheelchair and doesnt walk. Then NT said she just walked to the bathroom.    Ambulation/Gait               General Gait Details: NT said she walked to bathroom with walker  Stairs            Wheelchair Mobility     Tilt Bed    Modified Rankin (Stroke Patients Only)       Balance Overall balance assessment: Modified Independent                                            Pertinent Vitals/Pain Pain Assessment Pain Assessment: No/denies pain    Home Living Family/patient expects to be discharged to:: Private residence Living Arrangements: Other relatives Available Help at Discharge: Family;Available PRN/intermittently Type of Home: House           Home Equipment: Agricultural consultant (2 wheels);Wheelchair - manual Additional Comments: Unsure patient tells me she lives with brother, but very short/unwilling to answer questions    Prior Function               Mobility Comments: Says she uses a wheelchair, but got up and walked with NT ADLs Comments: Unknown; pt unable/unwilling to state     Extremity/Trunk Assessment   Upper Extremity Assessment Upper Extremity Assessment: Overall WFL for tasks assessed    Lower Extremity Assessment Lower Extremity Assessment: Overall WFL for tasks assessed    Cervical / Trunk Assessment Cervical / Trunk Assessment: Normal  Communication        Cognition Arousal: Alert Behavior During Therapy: Agitated   PT - Cognitive impairments: Difficult to assess  PT - Cognition Comments: unwilling to answer questions Following commands: Intact       Cueing Cueing Techniques: Verbal cues     General Comments      Exercises     Assessment/Plan    PT Assessment Patient does not need any further PT services  PT Problem List         PT Treatment Interventions      PT Goals (Current goals can be found in the Care Plan section)  Acute Rehab PT Goals Patient Stated Goal: return home PT Goal Formulation: With patient Time For Goal Achievement: 10/19/23 Potential to Achieve Goals: Good    Frequency       Co-evaluation               AM-PAC PT "6 Clicks" Mobility  Outcome Measure Help needed turning from your back to your side while in a flat bed without using bedrails?: None Help needed moving from lying on your back to  sitting on the side of a flat bed without using bedrails?: None Help needed moving to and from a bed to a chair (including a wheelchair)?: None Help needed standing up from a chair using your arms (e.g., wheelchair or bedside chair)?: None Help needed to walk in hospital room?: A Little Help needed climbing 3-5 steps with a railing? : A Little 6 Click Score: 22    End of Session   Activity Tolerance: Patient tolerated treatment well Patient left: in bed;with bed alarm set Nurse Communication: Mobility status      Time: 6962-9528 PT Time Calculation (min) (ACUTE ONLY): 10 min   Charges:   PT Evaluation $PT Eval Low Complexity: 1 Low   PT General Charges $$ ACUTE PT VISIT: 1 Visit         Wyndi Northrup, PT, GCS 10/17/23,3:31 PM

## 2023-10-17 NOTE — Progress Notes (Signed)
PHARMACY CONSULT NOTE - ELECTROLYTES  Pharmacy Consult for Electrolyte Monitoring and Replacement   Recent Labs: Height: 5\' 3"  (160 cm) Weight: 50.8 kg (111 lb 15.9 oz) IBW/kg (Calculated) : 52.4 Estimated Creatinine Clearance: 36.9 mL/min (A) (by C-G formula based on SCr of 1.12 mg/dL (H)). Potassium (mmol/L)  Date Value  10/17/2023 3.2 (L)   Magnesium (mg/dL)  Date Value  40/98/1191 3.0 (H)   Calcium (mg/dL)  Date Value  47/82/9562 7.9 (L)   Albumin (g/dL)  Date Value  13/04/6577 3.7   Phosphorus (mg/dL)  Date Value  46/96/2952 3.4   Sodium (mmol/L)  Date Value  10/17/2023 133 (L)   Assessment  Anne Phillips is a 73 y.o. female presenting with intermittent seizure episiodes. PMH significant for hypertension, hyperlipidemia, CAD, CHF, CKD, and seizures. Pharmacy has been consulted to monitor and replace electrolytes.   MIVF: NS @ 40 mL/hr Pertinent medications: N/A  Goal of Therapy: Electrolytes WNL  Plan:  Kcl 40 mEq x 2 F/u with AM labs.  Thank you for allowing pharmacy to be a part of this patient's care.  Ronnald Ramp, PharmD Clinical Pharmacist 10/17/2023 8:01 AM

## 2023-10-17 NOTE — Plan of Care (Signed)
  Problem: Education: Goal: Knowledge of disease or condition will improve Outcome: Progressing Goal: Knowledge of secondary prevention will improve (MUST DOCUMENT ALL) Outcome: Progressing Goal: Knowledge of patient specific risk factors will improve (DELETE if not current risk factor) Outcome: Progressing   Problem: Ischemic Stroke/TIA Tissue Perfusion: Goal: Complications of ischemic stroke/TIA will be minimized Outcome: Progressing   Problem: Coping: Goal: Will verbalize positive feelings about self Outcome: Progressing Goal: Will identify appropriate support needs Outcome: Progressing   Problem: Health Behavior/Discharge Planning: Goal: Ability to manage health-related needs will improve Outcome: Progressing Goal: Goals will be collaboratively established with patient/family Outcome: Progressing   Problem: Self-Care: Goal: Ability to participate in self-care as condition permits will improve Outcome: Progressing Goal: Verbalization of feelings and concerns over difficulty with self-care will improve Outcome: Progressing Goal: Ability to communicate needs accurately will improve Outcome: Progressing   Problem: Nutrition: Goal: Risk of aspiration will decrease Outcome: Progressing Goal: Dietary intake will improve Outcome: Progressing   Problem: Education: Goal: Knowledge of General Education information will improve Description: Including pain rating scale, medication(s)/side effects and non-pharmacologic comfort measures Outcome: Progressing

## 2023-10-17 NOTE — Evaluation (Signed)
Occupational Therapy Evaluation Patient Details Name: Anne Phillips MRN: [id number] DOB: 03-19-1951 Today's Date: 10/17/2023   History of Present Illness   Anne Phillips is a 73 y.o. female with medical history significant of seizure disorder, htn, CAD, stage 3 CKD, HLD, ? Cva presenting w/ seizure, encephalopathy, suspected cva. Limited history in setting of encephalopathy.     Clinical Impressions Pt seen this date for OT evaluation.  Pt lives at home with her brother who assists her as needed.  She also reports other family members who are in and out of the home who can assist as well.  She is able to demonstrate lower body self care except donning and doffing socks which she reports her brother usually assists her with.  She was able to demonstrate toileting, transfers and dressing without difficulty and appears to be at her baseline level of care.  She does not require skilled OT services at this time. Please reconsult if any other needs arise.        If plan is discharge home, recommend the following:         Functional Status Assessment   Patient has had a recent decline in their functional status and demonstrates the ability to make significant improvements in function in a reasonable and predictable amount of time.     Equipment Recommendations         Recommendations for Other Services         Precautions/Restrictions   Precautions Precautions: Fall Restrictions Weight Bearing Restrictions Per Provider Order: No     Mobility Bed Mobility Overal bed mobility: Modified Independent                  Transfers Overall transfer level: Modified independent Equipment used: None               General transfer comment: Pt able to demonstrate toileting without difficulty. Able to use rolling walker      Balance Overall balance assessment: Modified Independent                                         ADL either  performed or assessed with clinical judgement   ADL Overall ADL's : At baseline                                       General ADL Comments: Pt appears to be at baseline level of self care tasks. She lives with her brother who helps as needed and also has other family members who come over to assist.     Vision Baseline Vision/History: 1 Wears glasses       Perception         Praxis         Pertinent Vitals/Pain Pain Assessment Pain Assessment: No/denies pain     Extremity/Trunk Assessment Upper Extremity Assessment Upper Extremity Assessment: Overall WFL for tasks assessed   Lower Extremity Assessment Lower Extremity Assessment: Overall WFL for tasks assessed   Cervical / Trunk Assessment Cervical / Trunk Assessment: Normal   Communication     Cognition Arousal: Alert Behavior During Therapy: Agitated               OT - Cognition Comments: Pt appeared aggitated when asked questions regarding home set up and to demonstrate  performance of tasks.                 Following commands: Intact       Cueing  General Comments          Exercises     Shoulder Instructions      Home Living Family/patient expects to be discharged to:: Private residence Living Arrangements: Other relatives (brother stays with her) Available Help at Discharge: Family;Available PRN/intermittently Type of Home: House Home Access: Stairs to enter [employer] of Steps: 3-4 Entrance Stairs-Rails: Left Home Layout: One level     Bathroom Shower/Tub: Tub/shower unit;Curtain   Firefighter: Standard     Home Equipment: Agricultural consultant (2 wheels);Wheelchair - manual   Additional Comments: Pt reports she lives with her brother and he helps out as needed.  She reports she is able to dress herself but has difficulty at times putting on socks.      Prior Functioning/Environment Prior Level of Function : Needs assist              Mobility Comments: Pt uses a wheelchair at times but was able to stand and walk without difficulty. ADLs Comments: Pt able to demonstrate ability to perform UB and LB dressing except socks.  Pt able to demonstrate toilet transfer as well.    OT Problem List: Decreased strength   OT Treatment/Interventions:        OT Goals(Current goals can be found in the care plan section)   Acute Rehab OT Goals Patient Stated Goal: to go back home with family OT Goal Formulation: With patient Time For Goal Achievement: 11/07/23 Potential to Achieve Goals: Good   OT Frequency:       Co-evaluation              AM-PAC OT "6 Clicks" Daily Activity     Outcome Measure Help from another person eating meals?: None Help from another person taking care of personal grooming?: None Help from another person toileting, which includes using toliet, bedpan, or urinal?: None Help from another person bathing (including washing, rinsing, drying)?: None Help from another person to put on and taking off regular upper body clothing?: None Help from another person to put on and taking off regular lower body clothing?: A Little 6 Click Score: 23   End of Session Equipment Utilized During Treatment: Gait belt;Rolling walker (2 wheels)  Activity Tolerance: Patient tolerated treatment well Patient left: in bed;with call bell/phone within reach;with bed alarm set  OT Visit Diagnosis: Muscle weakness (generalized) (M62.81)                Time: 1610-9604 OT Time Calculation (min): 12 min Charges:  OT General Charges $OT Visit: 1 Visit OT Evaluation $OT Eval Low Complexity: 1 Low  Kensly Bowmer T Tiya Schrupp, OTR/L, CLT Emiah Pellicano 10/17/2023, 5:22 PM

## 2023-10-17 NOTE — Procedures (Signed)
Routine EEG Report  Anne Phillips is a 73 y.o. female with a history of seizure who is undergoing an EEG to evaluate for seizures.  Report: This EEG was acquired with electrodes placed according to the International 10-20 electrode system (including Fp1, Fp2, F3, F4, C3, C4, P3, P4, O1, O2, T3, T4, T5, T6, A1, A2, Fz, Cz, Pz). The following electrodes were missing or displaced: none.  The occipital dominant rhythm was 10 Hz. This activity is reactive to stimulation. Drowsiness was manifested by background fragmentation; deeper stages of sleep were identified by K complexes and sleep spindles. There was no focal slowing. There were no interictal epileptiform discharges. There were no electrographic seizures identified. There was no abnormal response to photic stimulation or hyperventilation.   Impression: This EEG was obtained while awake and asleep and is normal.    Clinical Correlation: Normal EEGs, however, do not rule out epilepsy.  Bing Neighbors, MD [facility] [phone]  If 7pm- 7am, please page neurology on call as listed in [facility].

## 2023-10-18 DIAGNOSIS — G40909 Epilepsy, unspecified, not intractable, without status epilepticus: Secondary | ICD-10-CM | POA: Diagnosis not present

## 2023-10-18 LAB — BASIC METABOLIC PANEL
Anion gap: 9 (ref 5–15)
BUN: 33 mg/dL — ABNORMAL HIGH (ref 8–23)
CO2: 26 mmol/L (ref 22–32)
Calcium: 8.2 mg/dL — ABNORMAL LOW (ref 8.9–10.3)
Chloride: 93 mmol/L — ABNORMAL LOW (ref 98–111)
Creatinine, Ser: 1.27 mg/dL — ABNORMAL HIGH (ref 0.44–1.00)
GFR, Estimated: 45 mL/min — ABNORMAL LOW (ref >60)
Glucose, Bld: 103 mg/dL — ABNORMAL HIGH (ref 70–99)
Potassium: 4.1 mmol/L (ref 3.5–5.1)
Sodium: 128 mmol/L — ABNORMAL LOW (ref 135–145)
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
## 2023-10-18 NOTE — Plan of Care (Signed)
  Problem: Education: Goal: Knowledge of disease or condition will improve Outcome: Progressing Goal: Knowledge of secondary prevention will improve (MUST DOCUMENT ALL) Outcome: Progressing Goal: Knowledge of patient specific risk factors will improve (DELETE if not current risk factor) Outcome: Progressing   Problem: Ischemic Stroke/TIA Tissue Perfusion: Goal: Complications of ischemic stroke/TIA will be minimized Outcome: Progressing   Problem: Coping: Goal: Will verbalize positive feelings about self Outcome: Progressing Goal: Will identify appropriate support needs Outcome: Progressing   Problem: Health Behavior/Discharge Planning: Goal: Ability to manage health-related needs will improve Outcome: Progressing Goal: Goals will be collaboratively established with patient/family Outcome: Progressing   Problem: Self-Care: Goal: Ability to participate in self-care as condition permits will improve Outcome: Progressing Goal: Verbalization of feelings and concerns over difficulty with self-care will improve Outcome: Progressing Goal: Ability to communicate needs accurately will improve Outcome: Progressing

## 2023-10-18 NOTE — Plan of Care (Signed)

## 2023-10-18 NOTE — Progress Notes (Addendum)
PHARMACY CONSULT NOTE - ELECTROLYTES  Pharmacy Consult for Electrolyte Monitoring and Replacement   Recent Labs: Height: 5\' 3"  (160 cm) Weight: 50.8 kg (111 lb 15.9 oz) IBW/kg (Calculated) : 52.4 Estimated Creatinine Clearance: 32.6 mL/min (A) (by C-G formula based on SCr of 1.27 mg/dL (H)). Potassium (mmol/L)  Date Value  10/18/2023 4.1   Magnesium (mg/dL)  Date Value  11/91/4782 3.0 (H)   Calcium (mg/dL)  Date Value  95/62/1308 8.2 (L)   Albumin (g/dL)  Date Value  65/78/4696 3.7   Phosphorus (mg/dL)  Date Value  29/52/8413 3.4   Sodium (mmol/L)  Date Value  10/18/2023 128 (L)   Assessment  Anne Phillips is a 73 y.o. female presenting with intermittent seizure episiodes. PMH significant for hypertension, hyperlipidemia, CAD, CHF, CKD, and seizures. Pharmacy has been consulted to monitor and replace electrolytes. Scr trending up. Na trending down.    MIVF: NS @ 40 mL/hr Pertinent medications: N/A  Goal of Therapy: Electrolytes WNL  Plan:  No replacement needed F/u with AM labs.  Thank you for allowing pharmacy to be a part of this patient's care.  Ronnald Ramp, PharmD Clinical Pharmacist 10/18/2023 8:06 AM

## 2023-10-18 NOTE — Progress Notes (Signed)
PROGRESS NOTE    Anne Phillips  UJW:[id number] DOB: Jan 14, 1951 DOA: 10/16/2023 PCP: Gillis Santa, MD  Chief Complaint  Patient presents with   Seizures    Hospital Course:  Anne Phillips is 73 y.o. female with seizure disorder, hypertension, CAD, CKD stage III, hyperlipidemia, history of CVA in April, who presents encephalopathic and per report patient had seizure at home. In the ER patient had witnessed tonic-clonic seizure, was given IV Keppra, lacosamide, and Ativan.  Head CT revealed age-indeterminate right occipital lobe infarct.  EEG and MRI were ordered and patient was admitted. MRI revealed remote PCA infarct without acute abnormalities.  Blood pressure was found to be very elevated, and she was started on antihypertensives.  EEG was unremarkable.  Neurology was consulted and recommends restarting all home medications. On outpatient pharmacy review it appears that patient was not taking her antihypertensives, antiepileptics, or methimazole.  Subjective: This morning patient has no acute complaints.  We discussed her difficulty obtaining meds outpatient.  She reports that she does not have consistent transportation.  She lives with her brother who does not drive.  She does not have access to a primary care physician easily.      Objective: Vitals:   10/17/23 1708 10/17/23 2028 10/18/23 0005 10/18/23 0724  BP: (!) 185/94 (!) 195/96 (!) 164/78 (!) 129/115  Pulse: 79 75 74 70  Resp: 20 16 16 16   Temp: 98 F (36.7 C) 97.8 F (36.6 C) (!) 97.5 F (36.4 C) 98.4 F (36.9 C)  TempSrc:      SpO2: 100% 100%  99%  Weight:      Height:       No intake or output data in the 24 hours ending 10/18/23 0724 Filed Weights   10/16/23 0701 10/16/23 2100  Weight: 59 kg 50.8 kg    Examination: General exam: Appears calm and comfortable, NAD  Respiratory system: No work of breathing, symmetric chest wall expansion Cardiovascular system: S1 & S2 heard, RRR.  Gastrointestinal  system: Abdomen is nondistended, soft and nontender.  Neuro: Alert and orientedx4. No focal neurological deficits. Extremities: Symmetric, expected ROM Skin: No rashes, lesions Psychiatry: Demonstrates appropriate judgement and insight. Mood & affect appropriate for situation.   Assessment & Plan:  Principal Problem:   Seizure disorder [facility]) Active Problems:   Acute encephalopathy   Stage 3b chronic kidney disease (HCC)   Atrial fibrillation (HCC)   TIA (transient ischemic attack)   Coronary artery disease   Hyperthyroidism   Hypokalemia   Hypertension   Dyslipidemia   Seizure-like activity (HCC)   Hypomagnesemia    Seizure disorder with breakthrough seizure - Neurology has been consulted - Seizure thought to be secondary to medication nonadherence, patient reports she is not been taking her medications  - MRI and EEG without significant abnormalities - Vimpat and Keppra levels: pending --Will resume Keppra and Vimpat at prior doses per neurology recommendations - Will deliver meds to bed tomorrow morning via [facility] pharmacy  Acute metabolic encephalopathy - Secondary to seizure as above - Patient appears to be at her mental status baseline now, alert and oriented x 4  CKD stage IIIb - Creatinine 1.27, GFR in 40s - Currently at baseline - Status post IV fluids - Continue to monitor CMP - Hold nephrotoxic agents - Renally dosed with a creatinine clearance of 32 when needed  Atrial fibrillation - This appears to be new onset EKG on arrival - Repeat EKG revealed PACs - Echocardiogram reveals normal EF, grade 2  diastolic dysfunction, and mild to moderate right atrial dilation - CHA2DS2-VASc: 7, patient would benefit from anticoagulation, however given her inconsistent medication adherence, high fall risk, and frequent seizures she is a poor candidate. - Outpatient follow-up with cardiology  History of CVA - MRI reveals remote PCA infarct, no acute changes - Stroke  workup: HgbA1c at goal, LDL at goal, echo with preserved EF no shunting appreciated - Continue asa and statin  CAD - RCA stent and known CTO RCA from cath in 2021 - No chest pain currently - Her EKG with A-fib followed by some PACs - Continue home meds  Hypothyroidism - Patient meant to be taking methimazole outpatient but is nonadherent - T4 is elevated with low TSH - Restart home dose methimazole  Hypokalemia Hypomagnesemia - Replace as needed  Hypertension - Continue home meds, titrate as needed  Dyslipidemia - Continue statin  Hyponatremia -acute worsening since arrival, hold newly added spironolactone - On chart review it appears that this is a chronic problem and previously presumed secondary to poor oral intake - Repeat CMP in AM  Medication nonadherence - This is a chronic and ongoing problem - Patient is a poor historian with multiple social determinants of health needs - [facility] has been consulted to assist in safe DC planning  Heart failure with preserved EF - Resume home meds - Euvolemic currently -- Echocardiogram reveals normal EF, grade 2 diastolic dysfunction, and mild to moderate right atrial dilation     DVT prophylaxis: Lovenox   Code Status: Full Code Family Communication:  Discussed directly with patient Disposition: Inpatient.  Patient does not have consistent and reliable transportation to her primary care office.  We have attempted to set up home health services for the patient.  When discussing discharge meds patient reports that she does not have a reliable way to get to the pharmacy.  Will plan to send medications directly to [facility] pharmacy tomorrow morning and hopefully with meds to beds can provide medication in hand at time of discharge.  Consultants:    Procedures:    Antimicrobials:  Anti-infectives (From admission, onward)    None       Data Reviewed: I have personally reviewed following labs and imaging studies CBC: Recent  Labs  Lab 10/16/23 0712  WBC 7.5  NEUTROABS 6.3  HGB 11.0*  HCT 33.2*  MCV 87.8  PLT 184   Basic Metabolic Panel: Recent Labs  Lab 10/16/23 0712 10/16/23 2224 10/17/23 0650 10/18/23 0537  NA 133*  --  133* 128*  K 2.3* 3.2* 3.2* 4.1  CL 89*  --  92* 93*  CO2 30  --  29 26  GLUCOSE 101*  --  155* 103*  BUN 14  --  18 33*  CREATININE 1.27*  --  1.12* 1.27*  CALCIUM 7.5*  --  7.9* 8.2*  MG 1.0*  --  3.0*  --   PHOS  --   --  3.4  --    GFR: Estimated Creatinine Clearance: 32.6 mL/min (A) (by C-G formula based on SCr of 1.27 mg/dL (H)). Liver Function Tests: Recent Labs  Lab 10/16/23 0712  AST 26  ALT 13  ALKPHOS 62  BILITOT 1.0  PROT 7.4  ALBUMIN 3.7   CBG: Recent Labs  Lab 10/16/23 0709  GLUCAP 115*    No results found for this or any previous visit (from the past 240 hours).   Radiology Studies: CT ANGIO HEAD NECK W WO CM Result Date: 10/17/2023 CLINICAL DATA:  Provided history: Stroke/TIA, determine embolic source. EXAM: CT ANGIOGRAPHY HEAD AND NECK WITH AND WITHOUT CONTRAST TECHNIQUE: Multidetector CT imaging of the head and neck was performed using the standard protocol during bolus administration of intravenous contrast. Multiplanar CT image reconstructions and MIPs were obtained to evaluate the vascular anatomy. Carotid stenosis measurements (when applicable) are obtained utilizing NASCET criteria, using the distal internal carotid diameter as the denominator. RADIATION DOSE REDUCTION: This exam was performed according to the departmental dose-optimization program which includes automated exposure control, adjustment of the mA and/or kV according to patient size and/or use of iterative reconstruction technique. CONTRAST:  65mL OMNIPAQUE IOHEXOL 350 MG/ML SOLN COMPARISON:  Brain MRI 10/16/2023.  Head CT 10/16/2023. FINDINGS: CT HEAD FINDINGS Brain: Generalized parenchymal atrophy. Redemonstrated chronic cortical/subcortical right PCA territory infarct within the  right occipital lobe. Background mild patchy and ill-defined hypoattenuation within the cerebral white matter, nonspecific but compatible with chronic small vessel disease. There is no acute intracranial hemorrhage. No acute demarcated cortical infarct. No extra-axial fluid collection. No evidence of an intracranial mass. No midline shift. Vascular: No hyperdense vessel.  Atherosclerotic calcifications. Skull: No calvarial fracture or aggressive osseous lesion. Sinuses/Orbits: No mass or acute finding within the imaged orbits. No significant paranasal sinus disease. Review of the MIP images confirms the above findings CTA NECK FINDINGS Aortic arch: Standard aortic branching. Atherosclerotic plaque within the visualized thoracic aorta and proximal major branch vessels of the neck. The innominate artery origin is incompletely included in the field of view. Within this limitation, there is no appreciable hemodynamically significant innominate or proximal subclavian artery stenosis. Right carotid system: CCA and ICA patent within the neck without hemodynamically significant stenosis (50% or greater). Mild atherosclerotic plaque about the carotid bifurcation and within the ICA. Partially retropharyngeal course of the cervical ICA. Left carotid system: CCA and ICA patent within the neck. Atherosclerotic plaque within the proximal ICA resulting in up to 40% stenosis. Atherosclerotic plaque within the mid cervical ICA resulting in up to 50% stenosis. Atherosclerotic plaque within the distal cervical ICA resulting in 30% stenosis. Vertebral arteries: Patent within the neck. The left vertebral artery is dominant. Atherosclerotic plaque within the left vertebral artery at the V1/V2 junction resulting in mild stenosis. Skeleton: Poor dentition. Cervical spondylosis. Nonacute fracture deformity of the posterolateral right third rib. Chronic fracture deformity of the distal right clavicle. Other neck: Enlarged multinodular  thyroid gland. The largest nodule is located within the right lobe inferiorly, measuring 4 mm and slightly extending into the upper mediastinum. The thyroid was previously assessed by ultrasound on 10/31/2022. Please refer to this prior examination for further description and for recommendations. Upper chest: No consolidation within the imaged lung apices. Emphysema. Review of the MIP images confirms the above findings CTA HEAD FINDINGS Anterior circulation: The intracranial internal carotid arteries are patent. Atherosclerotic plaque within both vessels. Most notably, most notably, there is moderate/severe stenosis of the cavernous/paraclinoid internal carotid arteries, bilaterally. The M1 middle cerebral arteries are patent. Severe stenosis within a proximal M2 right MCA vessel (series 13, image 22). The anterior cerebral arteries are patent. No intracranial aneurysm is identified. Posterior circulation: The intracranial vertebral arteries are patent. Atherosclerotic plaque within the left vertebral artery V4 segment with no more than mild stenosis. The basilar artery is patent. As carotid plaque within the proximal basilar artery with up to moderate stenosis. Posterior cerebral arteries are patent proximally. Attenuated appearance of distal right PCA branches. Posterior communicating arteries are diminutive or absent, bilaterally. Venous sinuses: Within the limitations  of contrast timing, no convincing thrombus. Anatomic variants: As described. Review of the MIP images confirms the above findings IMPRESSION: Non-contrast head CT: 1.  No evidence of an acute intracranial abnormality. 2. Known chronic right PCA territory infarct. 3. Background parenchymal atrophy and mild cerebral white matter chronic small vessel ischemic disease. CTA neck: 1. The common carotid and internal carotid arteries are patent within the neck. Atherosclerotic plaque bilaterally as described within body of the report, and most notably as  follows. Up to 40% stenosis of the proximal cervical left ICA. Up to 50% stenosis of the mid cervical left ICA. 30% stenosis of the distal cervical left ICA. 2. The vertebral arteries are patent within the neck. Atherosclerotic plaque within the left vertebral artery at the V1/V2 junction with mild stenosis. 3. Aortic Atherosclerosis (ICD10-I70.0) and Emphysema (ICD10-J43.9). 4. Nonacute fracture deformity of the posterolateral right third rib. 5. Chronic fracture deformity of the distal right clavicle. 6. Cervical spondylosis. CTA head: 1. No proximal intracranial large vessel occlusion identified. 2. Intracranial atherosclerotic disease with multifocal stenoses, most notably as follows. 3. Moderate/severe stenoses of the cavernous/paraclinoid internal carotid arteries, bilaterally. 4. Severe stenosis of a proximal M2 right middle cerebral artery vessel. 5. Up to moderate stenosis of the proximal basilar artery. 6. Attenuated appearance of distal right posterior cerebral artery branches. Electronically Signed   By: Jackey Loge D.O.   On: 10/17/2023 14:36   EEG adult Result Date: 10/17/2023 Jefferson Fuel, MD     10/17/2023 11:00 AM Routine EEG Report Anne Phillips is a 73 y.o. female with a history of seizure who is undergoing an EEG to evaluate for seizures. Report: This EEG was acquired with electrodes placed according to the International 10-20 electrode system (including Fp1, Fp2, F3, F4, C3, C4, P3, P4, O1, O2, T3, T4, T5, T6, A1, A2, Fz, Cz, Pz). The following electrodes were missing or displaced: none. The occipital dominant rhythm was 10 Hz. This activity is reactive to stimulation. Drowsiness was manifested by background fragmentation; deeper stages of sleep were identified by K complexes and sleep spindles. There was no focal slowing. There were no interictal epileptiform discharges. There were no electrographic seizures identified. There was no abnormal response to photic stimulation or  hyperventilation. Impression: This EEG was obtained while awake and asleep and is normal.   Clinical Correlation: Normal EEGs, however, do not rule out epilepsy. Bing Neighbors, MD [facility] [phone] If 7pm- 7am, please page neurology on call as listed in AMION.   MR BRAIN WO CONTRAST Result Date: 10/16/2023 CLINICAL DATA:  Neuro deficit, acute, stroke suspected EXAM: MRI HEAD WITHOUT CONTRAST TECHNIQUE: Multiplanar, multiecho pulse sequences of the brain and surrounding structures were obtained without intravenous contrast. COMPARISON:  Same day CT head. FINDINGS: Motion limited and incomplete study due to patient intolerance. Brain: No acute infarction, hemorrhage, hydrocephalus, extra-axial collection or mass lesion. Remote right occipital infarct (PCA territory). Vascular: Major arterial flow voids are maintained skull base. Skull and upper cervical spine: Normal marrow signal. Sinuses/Orbits: Clear sinuses.  No acute orbital findings. Other: No mastoid effusions. IMPRESSION: 1. Motion limited and incomplete study without obvious acute abnormality. 2. Remote right PCA territory infarct. Electronically Signed   By: Feliberto Harts M.D.   On: 10/16/2023 23:45   US Venous Img Upper Uni Left (DVT) Result Date: 10/16/2023 CLINICAL DATA:  [id number] Left upper extremity swelling [id number] EXAM: Left UPPER EXTREMITY VENOUS DOPPLER ULTRASOUND TECHNIQUE: Gray-scale sonography with graded compression, as well as color Doppler and duplex  ultrasound were performed to evaluate the upper extremity deep venous system from the level of the subclavian vein and including the jugular, axillary, basilic, radial, ulnar and upper cephalic vein. Spectral Doppler was utilized to evaluate flow at rest and with distal augmentation maneuvers. COMPARISON:  None Available. FINDINGS: Contralateral Subclavian Vein: Respiratory phasicity is normal and symmetric with the symptomatic side. No evidence of thrombus. Normal  compressibility. Internal Jugular Vein: No evidence of thrombus. Normal compressibility, respiratory phasicity and response to augmentation. Subclavian Vein: No evidence of thrombus. Normal compressibility, respiratory phasicity and response to augmentation. Axillary Vein: No evidence of thrombus. Normal compressibility, respiratory phasicity and response to augmentation. Cephalic Vein: Not visualized. Basilic Vein: No evidence of thrombus. Normal compressibility, respiratory phasicity and response to augmentation. Brachial Veins: Not visualized. Radial Veins: Not visualized Ulnar Veins: Not visualized. Venous Reflux:  None visualized. Other Findings:  Subcutaneus soft tissue edema. IMPRESSION: No evidence of DVT within the left upper extremity with markedly limited evaluation: Cephalic, brachial, radial, ulnar veins not visualized. Electronically Signed   By: Tish Frederickson M.D.   On: 10/16/2023 20:14   ECHOCARDIOGRAM COMPLETE Result Date: 10/16/2023    ECHOCARDIOGRAM REPORT   Patient Name:   Anne Phillips Date of Exam: 10/16/2023 Medical Rec #:  [id number]        Height:       60.0 in Accession #:    [id number]       Weight:       130.1 lb Date of Birth:  Nov 05, 1950        BSA:          1.555 m Patient Age:    72 years         BP:           181/92 mmHg Patient Gender: F                HR:           64 bpm. Exam Location:  [facility] Procedure: 2D Echo, Cardiac Doppler and Color Doppler (Both Spectral and Color            Flow Doppler were utilized during procedure). Indications:     TIA G45.9  History:         Patient has prior history of Echocardiogram examinations. CAD;                  Risk Factors:Hypertension.  Sonographer:     Neysa Bonito Roar Referring Phys:  [id number] Francoise Schaumann NEWTON Diagnosing Phys: Debbe Odea MD IMPRESSIONS  1. Left ventricular ejection fraction, by estimation, is 55 to 60%. The left ventricle has normal function. The left ventricle has no regional wall motion abnormalities. There is  moderate concentric left ventricular hypertrophy. Left ventricular diastolic parameters are consistent with Grade II diastolic dysfunction (pseudonormalization).  2. Right ventricular systolic function is low normal. The right ventricular size is normal. There is moderately elevated pulmonary artery systolic pressure.  3. Left atrial size was moderately dilated.  4. Right atrial size was mildly dilated.  5. A small pericardial effusion is present. The pericardial effusion is circumferential.  6. The mitral valve is normal in structure. Mild mitral valve regurgitation.  7. Tricuspid valve regurgitation is mild to moderate.  8. The aortic valve is tricuspid. Aortic valve regurgitation is mild. Aortic valve sclerosis is present, with no evidence of aortic valve stenosis.  9. The inferior vena cava is normal in size with <50% respiratory variability, suggesting right atrial pressure  of 8 mmHg. FINDINGS  Left Ventricle: Left ventricular ejection fraction, by estimation, is 55 to 60%. The left ventricle has normal function. The left ventricle has no regional wall motion abnormalities. Strain imaging was not performed. The left ventricular internal cavity  size was normal in size. There is moderate concentric left ventricular hypertrophy. Left ventricular diastolic parameters are consistent with Grade II diastolic dysfunction (pseudonormalization). Right Ventricle: The right ventricular size is normal. No increase in right ventricular wall thickness. Right ventricular systolic function is low normal. There is moderately elevated pulmonary artery systolic pressure. The tricuspid regurgitant velocity  is 3.24 m/s, and with an assumed right atrial pressure of 8 mmHg, the estimated right ventricular systolic pressure is 50.0 mmHg. Left Atrium: Left atrial size was moderately dilated. Right Atrium: Right atrial size was mildly dilated. Pericardium: A small pericardial effusion is present. The pericardial effusion is  circumferential. Mitral Valve: The mitral valve is normal in structure. Mild mitral valve regurgitation. MV peak gradient, 16.3 mmHg. The mean mitral valve gradient is 7.0 mmHg. Tricuspid Valve: The tricuspid valve is normal in structure. Tricuspid valve regurgitation is mild to moderate. Aortic Valve: The aortic valve is tricuspid. Aortic valve regurgitation is mild. Aortic regurgitation PHT measures 472 msec. Aortic valve sclerosis is present, with no evidence of aortic valve stenosis. Aortic valve mean gradient measures 7.0 mmHg. Aortic valve peak gradient measures 13.8 mmHg. Aortic valve area, by VTI measures 1.03 cm. Pulmonic Valve: The pulmonic valve was normal in structure. Pulmonic valve regurgitation is mild. Aorta: The aortic root and ascending aorta are structurally normal, with no evidence of dilitation. Venous: The inferior vena cava is normal in size with less than 50% respiratory variability, suggesting right atrial pressure of 8 mmHg. IAS/Shunts: No atrial level shunt detected by color flow Doppler. Additional Comments: 3D imaging was not performed.  LEFT VENTRICLE PLAX 2D LVIDd:         3.50 cm   Diastology LVIDs:         2.50 cm   LV e' medial:    4.35 cm/s LV PW:         1.60 cm   LV E/e' medial:  34.0 LV IVS:        2.10 cm   LV e' lateral:   7.94 cm/s LVOT diam:     1.70 cm   LV E/e' lateral: 18.6 LV SV:         41 LV SV Index:   26 LVOT Area:     2.27 cm  RIGHT VENTRICLE RV Basal diam:  3.60 cm RV Mid diam:    2.60 cm RV S prime:     8.59 cm/s TAPSE (M-mode): 0.9 cm LEFT ATRIUM           Index        RIGHT ATRIUM           Index LA diam:      4.30 cm 2.77 cm/m   RA Area:     21.40 cm LA Vol (A4C): 74.2 ml 47.73 ml/m  RA Volume:   58.60 ml  37.69 ml/m  AORTIC VALVE                     PULMONIC VALVE AV Area (Vmax):    1.05 cm      PV Vmax:          1.12 m/s AV Area (Vmean):   0.92 cm      PV Peak grad:  5.0 mmHg AV Area (VTI):     1.03 cm      PR End Diast Vel: 13.84 msec AV Vmax:            186.00 cm/s   RVOT Peak grad:   1 mmHg AV Vmean:          121.000 cm/s AV VTI:            0.398 m AV Peak Grad:      13.8 mmHg AV Mean Grad:      7.0 mmHg LVOT Vmax:         85.90 cm/s LVOT Vmean:        49.000 cm/s LVOT VTI:          0.181 m LVOT/AV VTI ratio: 0.45 AI PHT:            472 msec  AORTA Ao Root diam: 2.60 cm Ao Asc diam:  2.90 cm MITRAL VALVE                TRICUSPID VALVE MV Area (PHT): 4.36 cm     TR Peak grad:   42.0 mmHg MV Area VTI:   1.21 cm     TR Vmax:        324.00 cm/s MV Peak grad:  16.3 mmHg MV Mean grad:  7.0 mmHg     SHUNTS MV Vmax:       2.02 m/s     Systemic VTI:  0.18 m MV Vmean:      117.0 cm/s   Systemic Diam: 1.70 cm MV Decel Time: 174 msec MV E velocity: 148.00 cm/s MV A velocity: 77.10 cm/s MV E/A ratio:  1.92 Debbe Odea MD Electronically signed by Debbe Odea MD Signature Date/Time: 10/16/2023/5:41:41 PM    Final    DG Abd 1 View Result Date: 10/16/2023 CLINICAL DATA:  [id number] TIA (transient ischemic attack) [id number] EXAM: ABDOMEN - 1 VIEW COMPARISON:  08/24/2015 FINDINGS: The bowel gas pattern is nonobstructive. No radio-opaque calculi or other significant radiographic abnormality are seen. Prominent atherosclerotic vascular calcifications. IMPRESSION: Negative. Electronically Signed   By: Duanne Guess D.O.   On: 10/16/2023 16:02   DG Chest 1 View Result Date: 10/16/2023 CLINICAL DATA:  09811 TIA (transient ischemic attack) [id number] EXAM: CHEST  1 VIEW COMPARISON:  12/31/2022 FINDINGS: Moderate cardiomegaly. Aortic atherosclerosis. No focal airspace consolidation, pleural effusion, or pneumothorax. IMPRESSION: Moderate cardiomegaly. No acute cardiopulmonary findings. Electronically Signed   By: Duanne Guess D.O.   On: 10/16/2023 16:01   DG HIPS BILAT WITH PELVIS 2V Result Date: 10/16/2023 CLINICAL DATA:  91478 TIA (transient ischemic attack) [id number] EXAM: DG HIP (WITH OR WITHOUT PELVIS) 2V BILAT COMPARISON:  None Available. FINDINGS: Sclerotic and  lucent changes of the bilateral femoral heads suggesting avascular necrosis. Flattening of both femoral head contours, right worse than left. Hip joints remain aligned without dislocation. Mild to moderate degenerative changes bilaterally. No pelvic diastasis. Atherosclerotic vascular calcifications. 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 Flattening of both femoral head contours, right worse than left. Electronically Signed   By: Duanne Guess D.O.   On: 10/16/2023 16:00   CT Head Wo Contrast Result Date: 10/16/2023 CLINICAL DATA:  Seizure disorder, clinical change EXAM: CT HEAD WITHOUT CONTRAST TECHNIQUE: Contiguous axial images were obtained from the base of the skull through the vertex without intravenous contrast. RADIATION DOSE REDUCTION: This exam was performed according to the departmental dose-optimization program which includes automated exposure control, adjustment of the mA and/or  kV according to patient size and/or use of iterative reconstruction technique. COMPARISON:  Head CT 12/31/2022 FINDINGS: Brain: No hemorrhage. No hydrocephalus. No extra-axial fluid collection. No mass effect. No mass lesion. 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 Skull: Normal. Negative for fracture or focal lesion. Sinuses/Orbits: No middle ear or mastoid effusion. Paranasal sinuses clear. Orbits are unremarkable Other: None. 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 If there is clinical concern for an acute infarct, consider further evaluation with MRI. Electronically Signed   By: Lorenza Cambridge M.D.   On: 10/16/2023 08:55    Scheduled Meds:  aspirin EC  81 mg Oral Daily   atorvastatin  40 mg Oral Daily   calcium carbonate  1 tablet Oral BID WC    carvedilol  6.25 mg Oral BID   cyanocobalamin  1,000 mcg Oral Daily   empagliflozin  10 mg Oral Daily   enoxaparin (LOVENOX) injection  40 mg Subcutaneous Q24H   feeding supplement  237 mL Oral BID BM   gabapentin  300 mg Oral BID   lacosamide  150 mg Oral BID   levETIRAcetam  500 mg Oral BID   losartan  100 mg Oral Daily   methimazole  5 mg Oral Daily   spironolactone  12.5 mg Oral Daily   Continuous Infusions:   LOS: 0 days    Total time spent coordinating care:   Debarah Crape, DO [facility]  To contact the attending physician between 7A-7P please use Epic Chat. To contact the covering physician during after hours 7P-7A, please review Amion.   10/18/2023, 7:24 AM   *This document has been created with the assistance of dictation software. Please excuse typographical errors. *

## 2023-10-18 NOTE — Care Management Obs Status (Signed)
MEDICARE OBSERVATION STATUS NOTIFICATION   Patient Details  Name: Anne Phillips MRN: [id number] Date of Birth: 06-20-1951   Medicare Observation Status Notification Given:       Rodney Langton, RN 10/18/2023, 11:04 AM

## 2023-10-19 ENCOUNTER — Other Ambulatory Visit (HOSPITAL_COMMUNITY): Payer: Self-pay

## 2023-10-19 ENCOUNTER — Other Ambulatory Visit: Payer: Self-pay

## 2023-10-19 DIAGNOSIS — G40909 Epilepsy, unspecified, not intractable, without status epilepticus: Secondary | ICD-10-CM | POA: Diagnosis not present

## 2023-10-19 LAB — CBC WITH DIFFERENTIAL/PLATELET
Abs Immature Granulocytes: 0.04 10*3/uL (ref 0.00–0.07)
Basophils Absolute: 0 10*3/uL (ref 0.0–0.1)
Basophils Relative: 0 %
Eosinophils Absolute: 0 10*3/uL (ref 0.0–0.5)
Eosinophils Relative: 0 %
HCT: 29.1 % — ABNORMAL LOW (ref 36.0–46.0)
Hemoglobin: 9.8 g/dL — ABNORMAL LOW (ref 12.0–15.0)
Immature Granulocytes: 0 %
Lymphocytes Relative: 5 %
Lymphs Abs: 0.5 10*3/uL — ABNORMAL LOW (ref 0.7–4.0)
MCH: 29.2 pg (ref 26.0–34.0)
MCHC: 33.7 g/dL (ref 30.0–36.0)
MCV: 86.6 fL (ref 80.0–100.0)
Monocytes Absolute: 0.8 10*3/uL (ref 0.1–1.0)
Monocytes Relative: 8 %
Neutro Abs: 7.9 10*3/uL — ABNORMAL HIGH (ref 1.7–7.7)
Neutrophils Relative %: 87 %
Platelets: 147 10*3/uL — ABNORMAL LOW (ref 150–400)
RBC: 3.36 MIL/uL — ABNORMAL LOW (ref 3.87–5.11)
RDW: 15.5 % (ref 11.5–15.5)
WBC: 9.2 10*3/uL (ref 4.0–10.5)
nRBC: 0 % (ref 0.0–0.2)

## 2023-10-19 LAB — COMPREHENSIVE METABOLIC PANEL
ALT: 19 U/L (ref 0–44)
AST: 23 U/L (ref 15–41)
Albumin: 2.9 g/dL — ABNORMAL LOW (ref 3.5–5.0)
Alkaline Phosphatase: 49 U/L (ref 38–126)
Anion gap: 7 (ref 5–15)
BUN: 32 mg/dL — ABNORMAL HIGH (ref 8–23)
CO2: 27 mmol/L (ref 22–32)
Calcium: 7.8 mg/dL — ABNORMAL LOW (ref 8.9–10.3)
Chloride: 94 mmol/L — ABNORMAL LOW (ref 98–111)
Creatinine, Ser: 1.27 mg/dL — ABNORMAL HIGH (ref 0.44–1.00)
GFR, Estimated: 45 mL/min — ABNORMAL LOW (ref >60)
Glucose, Bld: 89 mg/dL (ref 70–99)
Potassium: 4 mmol/L (ref 3.5–5.1)
Sodium: 128 mmol/L — ABNORMAL LOW (ref 135–145)
Total Bilirubin: 0.8 mg/dL (ref 0.0–1.2)
Total Protein: 5.9 g/dL — ABNORMAL LOW (ref 6.5–8.1)

## 2023-10-19 LAB — MAGNESIUM: Magnesium: 1.9 mg/dL (ref 1.7–2.4)

## 2023-10-19 LAB — LACOSAMIDE: Lacosamide: <0.5 ug/mL — ABNORMAL LOW (ref 5.0–10.0)

## 2023-10-19 LAB — PHOSPHORUS: Phosphorus: 2 mg/dL — ABNORMAL LOW (ref 2.5–4.6)

## 2023-10-19 MED ORDER — PANTOPRAZOLE SODIUM 40 MG PO TBEC
40.0000 mg | DELAYED_RELEASE_TABLET | Freq: Every day | ORAL | 0 refills | Status: DC
  Filled 2023-10-19: qty 30, 30d supply, fill #0

## 2023-10-19 MED ORDER — CARVEDILOL 6.25 MG PO TABS
6.2500 mg | ORAL_TABLET | Freq: Two times a day (BID) | ORAL | 0 refills | Status: DC
  Filled 2023-10-19: qty 60, 30d supply, fill #0

## 2023-10-19 MED ORDER — LEVETIRACETAM 500 MG PO TABS
500.0000 mg | ORAL_TABLET | Freq: Two times a day (BID) | ORAL | 0 refills | Status: DC
  Filled 2023-10-19 (×2): qty 60, 30d supply, fill #0

## 2023-10-19 MED ORDER — METHIMAZOLE 5 MG PO TABS
5.0000 mg | ORAL_TABLET | Freq: Every day | ORAL | 0 refills | Status: DC
  Filled 2023-10-19: qty 30, 30d supply, fill #0

## 2023-10-19 MED ORDER — K PHOS MONO-SOD PHOS DI & MONO 155-852-130 MG PO TABS
500.0000 mg | ORAL_TABLET | Freq: Once | ORAL | Status: AC
  Administered 2023-10-19: 500 mg via ORAL
  Filled 2023-10-19: qty 2

## 2023-10-19 MED ORDER — CALCIUM CARBONATE ANTACID 500 MG PO CHEW
1.0000 | CHEWABLE_TABLET | Freq: Two times a day (BID) | ORAL | 0 refills | Status: DC
  Filled 2023-10-19: qty 150, 75d supply, fill #0

## 2023-10-19 MED ORDER — CYANOCOBALAMIN 1000 MCG PO TABS: 1000.0000 ug | ORAL_TABLET | Freq: Every day | ORAL | Status: DC

## 2023-10-19 MED ORDER — EMPAGLIFLOZIN 10 MG PO TABS
10.0000 mg | ORAL_TABLET | Freq: Every day | ORAL | 0 refills | Status: DC
  Filled 2023-10-19: qty 30, 30d supply, fill #0

## 2023-10-19 MED ORDER — ADULT MULTIVITAMIN W/MINERALS CH
1.0000 | ORAL_TABLET | Freq: Every day | ORAL | Status: DC
Start: 2023-10-19 — End: 2023-11-18

## 2023-10-19 MED ORDER — LACOSAMIDE 50 MG PO TABS
150.0000 mg | ORAL_TABLET | Freq: Two times a day (BID) | ORAL | 0 refills | Status: DC
  Filled 2023-10-19: qty 12, 2d supply, fill #0

## 2023-10-19 MED ORDER — LACOSAMIDE 150 MG PO TABS
150.0000 mg | ORAL_TABLET | Freq: Two times a day (BID) | ORAL | 0 refills | Status: DC
  Filled 2023-10-19 – 2023-10-20 (×4): qty 60, 30d supply, fill #0

## 2023-10-19 NOTE — Plan of Care (Signed)

## 2023-10-19 NOTE — Discharge Summary (Addendum)
Physician Discharge Summary   Patient: Anne Phillips MRN: [id number] DOB: [birth year]  Admit date:     10/16/2023  Discharge date: 10/19/23  Discharge Physician: Chloe Bluett   PCP: Gillis Santa, MD   Recommendations at discharge:   Take antiepileptics as recommended Keep scheduled follow-up appointment with primary care provider  Discharge Diagnoses: Principal Problem:   Seizure disorder [facility]) Active Problems:   Acute encephalopathy   Stage 3b chronic kidney disease (HCC)   Atrial fibrillation (HCC)   TIA (transient ischemic attack)   Coronary artery disease   Hyperthyroidism   Hypokalemia   Hypertension   Dyslipidemia   Seizure-like activity (HCC)   Hypomagnesemia  Resolved Problems:   * No resolved hospital problems. *  Hospital Course: Anne Phillips is a 73 y.o. female with medical history significant of seizure disorder, htn, CAD, stage 3 CKD, HLD, ? Cva presenting w/ seizure, encephalopathy, suspected cva. Limited history in setting of encephalopathy. Per report, pt with seizure event at home. Pt had continue GTC seizure at home. Episode happened while patient was laying down per report.  Pt noted to not be compliant with home seizure  medications per report.Pt with episode of generalized shaking while in ER. Was given IV keppra, lacosamide and ativan for treatment. Pt subsequently became significantly lethargic thereafter. No reports or chest pain, SOB, nausea or vomiting. Noted L arm pain and swelling prior to event.  Presented to ER afebrile, hemodynamically stable. WBC 7.5, hgb 11, plt 184, K 2.3, Cr 1.27. Mg 1. CT head w/ new chronic appearing but age indeterminant R occipital lobe infarct.  Review of Systems: As mentioned in the history of present illness. All other systems reviewed and are negative.   Assessment and Plan:   Principal Problem:   Seizure disorder [facility]) Active Problems:   Acute encephalopathy   Stage 3b chronic kidney disease (HCC)    Atrial fibrillation (HCC)   TIA (transient ischemic attack)   Coronary artery disease   Hyperthyroidism   Hypokalemia   Hypertension   Dyslipidemia   Seizure-like activity (HCC)   Hypomagnesemia       Seizure disorder with breakthrough seizure - Appreciate neurology input - Seizure thought to be secondary to medication nonadherence, patient reported that she has not been taking her medications  - MRI and EEG without significant abnormalities - Vimpat and Keppra levels: pending --Continue Keppra and Vimpat at prior doses per neurology recommendations - Meds to beds prior to discharge    Acute metabolic encephalopathy - Secondary to seizure as above - Patient appears to be at her mental status baseline now, alert and oriented x 4    CKD stage IIIb - Creatinine 1.27, GFR in 40s - Currently at baseline - Status post IV fluids - Continue to monitor CMP - Hold nephrotoxic agents - Renally dosed with a creatinine clearance of 32 when needed   Atrial fibrillation - This appears to be new onset EKG on arrival - Repeat EKG revealed PACs - Echocardiogram reveals normal EF, grade 2 diastolic dysfunction, and mild to moderate right atrial dilation - CHA2DS2-VASc: 7, patient would benefit from anticoagulation, however given her inconsistent medication adherence, high fall risk, and frequent seizures she is a poor candidate. - Outpatient follow-up with cardiology   History of CVA - MRI reveals remote PCA infarct, no acute changes - Stroke workup: HgbA1c at goal, LDL at goal, echo with preserved EF no shunting appreciated - Continue asa and statin   CAD - RCA stent and  known CTO RCA from cath in 2021 - No chest pain currently - Her EKG with A-fib followed by some PACs - Continue home meds   Hypothyroidism - Patient meant to be taking methimazole outpatient but is nonadherent - T4 is elevated with low TSH - Restart home dose methimazole   Hypokalemia Hypomagnesemia - Replace  as needed   Hypertension - Continue home meds, titrate as needed   Dyslipidemia - Continue statin   Hyponatremia -acute worsening since arrival, most likely related to spironolactone - On chart review it appears that this is a chronic problem and previously presumed secondary to poor oral intake -Spironolactone has been discontinued   Medication nonadherence - This is a chronic and ongoing problem - Patient is a poor historian with multiple social determinants of health needs - TOC has been consulted to assist in safe DC planning   Heart failure with preserved EF - Continue carvedilol and losartan - Euvolemic currently -- Echocardiogram reveals normal EF, grade 2 diastolic dysfunction, and mild to moderate right atrial dilation           Consultants: Neurology Procedures performed: None  Disposition: Home Diet recommendation:  Discharge Diet Orders (From admission, onward)     Start     Ordered   10/19/23 0000  Diet - low sodium heart healthy        10/19/23 1247           Cardiac diet DISCHARGE MEDICATION: Allergies as of 10/19/2023       Reactions   Iodinated Contrast Media Hives, Other (See Comments)   Rash Other Reaction(s): Other (See Comments) Rash   Rash  Rash    Rash  Rash   Other Hives   Metal-reaction=rash Metal-reaction=rash        Medication List     STOP taking these medications    clonazePAM 1 MG tablet Commonly known as: KlonoPIN   gabapentin 300 MG capsule Commonly known as: NEURONTIN   HYDROcodone-acetaminophen 5-325 MG tablet Commonly known as: NORCO/VICODIN   spironolactone 25 MG tablet Commonly known as: ALDACTONE       TAKE these medications    acetaminophen 325 MG tablet Commonly known as: TYLENOL Take 2 tablets (650 mg total) by mouth every 6 (six) hours as needed for mild pain (or Fever >/= 101).   ammonium lactate 12 % cream Commonly known as: AMLACTIN Apply 1 Application topically 2 (two) times  daily.   aspirin EC 81 MG tablet Take 1 tablet (81 mg total) by mouth daily. Swallow whole.   atorvastatin 40 MG tablet Commonly known as: LIPITOR Take 40 mg by mouth daily.   calcium carbonate 500 MG chewable tablet Commonly known as: Calcium Antacid Chew 1 tablet (200 mg of elemental calcium total) by mouth 2 (two) times daily with a meal.   carvedilol 6.25 MG tablet Commonly known as: COREG Take 1 tablet (6.25 mg total) by mouth 2 (two) times daily.   cyanocobalamin 1000 MCG tablet Take 1 tablet (1,000 mcg total) by mouth daily.   empagliflozin 10 MG Tabs tablet Commonly known as: JARDIANCE Take 1 tablet (10 mg total) by mouth daily. Start taking on: October 20, 2023   feeding supplement Liqd Take 237 mLs by mouth 2 (two) times daily between meals.   Lacosamide 150 MG Tabs Take 1 tablet (150 mg total) by mouth 2 (two) times daily. What changed: when to take this   levETIRAcetam 500 MG tablet Commonly known as: KEPPRA Take 1 tablet (500 mg  total) by mouth 2 (two) times daily. What changed:  when to take this Another medication with the same name was removed. Continue taking this medication, and follow the directions you see here.   losartan 100 MG tablet Commonly known as: COZAAR Take 100 mg by mouth daily.   methimazole 5 MG tablet Commonly known as: TAPAZOLE Take 1 tablet (5 mg total) by mouth daily.   multivitamin with minerals Tabs tablet Take 1 tablet by mouth daily.   nicotine 14 mg/24hr patch Commonly known as: NICODERM CQ - dosed in mg/24 hours Place 1 patch (14 mg total) onto the skin daily.   nitroGLYCERIN 0.4 MG SL tablet Commonly known as: NITROSTAT Place 0.4 mg under the tongue every 5 (five) minutes as needed for chest pain.   pantoprazole 40 MG tablet Commonly known as: Protonix Take 1 tablet (40 mg total) by mouth daily.        Follow-up Information     Gillis Santa, MD Follow up.   Specialty: Family Medicine Why: Hospital  follow up Contact information: [address] [place] [address] [phone]                Discharge Exam: Ceasar Mons Weights   10/16/23 0701 10/16/23 2100  Weight: 59 kg 50.8 kg   General exam: Appears calm and comfortable, NAD  Respiratory system: No work of breathing, symmetric chest wall expansion Cardiovascular system: S1 & S2 heard, RRR.  Gastrointestinal system: Abdomen is nondistended, soft and nontender.  Neuro: Alert and orientedx4. No focal neurological deficits. Extremities: Symmetric, expected ROM Skin: No rashes, lesions Psychiatry: Demonstrates appropriate judgement and insight. Mood & affect appropriate for situation.     Condition at discharge: stable  The results of significant diagnostics from this hospitalization (including imaging, microbiology, ancillary and laboratory) are listed below for reference.   Imaging Studies: CT ANGIO HEAD NECK W WO CM Result Date: 10/17/2023 CLINICAL DATA:  Provided history: Stroke/TIA, determine embolic source. EXAM: CT ANGIOGRAPHY HEAD AND NECK WITH AND WITHOUT CONTRAST TECHNIQUE: Multidetector CT imaging of the head and neck was performed using the standard protocol during bolus administration of intravenous contrast. Multiplanar CT image reconstructions and MIPs were obtained to evaluate the vascular anatomy. Carotid stenosis measurements (when applicable) are obtained utilizing NASCET criteria, using the distal internal carotid diameter as the denominator. RADIATION DOSE REDUCTION: This exam was performed according to the departmental dose-optimization program which includes automated exposure control, adjustment of the mA and/or kV according to patient size and/or use of iterative reconstruction technique. CONTRAST:  65mL OMNIPAQUE IOHEXOL 350 MG/ML SOLN COMPARISON:  Brain MRI 10/16/2023.  Head CT 10/16/2023. FINDINGS: CT HEAD FINDINGS Brain: Generalized parenchymal atrophy. Redemonstrated chronic  cortical/subcortical right PCA territory infarct within the right occipital lobe. Background mild patchy and ill-defined hypoattenuation within the cerebral white matter, nonspecific but compatible with chronic small vessel disease. There is no acute intracranial hemorrhage. No acute demarcated cortical infarct. No extra-axial fluid collection. No evidence of an intracranial mass. No midline shift. Vascular: No hyperdense vessel.  Atherosclerotic calcifications. Skull: No calvarial fracture or aggressive osseous lesion. Sinuses/Orbits: No mass or acute finding within the imaged orbits. No significant paranasal sinus disease. Review of the MIP images confirms the above findings CTA NECK FINDINGS Aortic arch: Standard aortic branching. Atherosclerotic plaque within the visualized thoracic aorta and proximal major branch vessels of the neck. The innominate artery origin is incompletely included in the field of view. Within this limitation, there is no appreciable hemodynamically significant  innominate or proximal subclavian artery stenosis. Right carotid system: CCA and ICA patent within the neck without hemodynamically significant stenosis (50% or greater). Mild atherosclerotic plaque about the carotid bifurcation and within the ICA. Partially retropharyngeal course of the cervical ICA. Left carotid system: CCA and ICA patent within the neck. Atherosclerotic plaque within the proximal ICA resulting in up to 40% stenosis. Atherosclerotic plaque within the mid cervical ICA resulting in up to 50% stenosis. Atherosclerotic plaque within the distal cervical ICA resulting in 30% stenosis. Vertebral arteries: Patent within the neck. The left vertebral artery is dominant. Atherosclerotic plaque within the left vertebral artery at the V1/V2 junction resulting in mild stenosis. Skeleton: Poor dentition. Cervical spondylosis. Nonacute fracture deformity of the posterolateral right third rib. Chronic fracture deformity of the  distal right clavicle. Other neck: Enlarged multinodular thyroid gland. The largest nodule is located within the right lobe inferiorly, measuring 4 mm and slightly extending into the upper mediastinum. The thyroid was previously assessed by ultrasound on 10/31/2022. Please refer to this prior examination for further description and for recommendations. Upper chest: No consolidation within the imaged lung apices. Emphysema. Review of the MIP images confirms the above findings CTA HEAD FINDINGS Anterior circulation: The intracranial internal carotid arteries are patent. Atherosclerotic plaque within both vessels. Most notably, most notably, there is moderate/severe stenosis of the cavernous/paraclinoid internal carotid arteries, bilaterally. The M1 middle cerebral arteries are patent. Severe stenosis within a proximal M2 right MCA vessel (series 13, image 22). The anterior cerebral arteries are patent. No intracranial aneurysm is identified. Posterior circulation: The intracranial vertebral arteries are patent. Atherosclerotic plaque within the left vertebral artery V4 segment with no more than mild stenosis. The basilar artery is patent. As carotid plaque within the proximal basilar artery with up to moderate stenosis. Posterior cerebral arteries are patent proximally. Attenuated appearance of distal right PCA branches. Posterior communicating arteries are diminutive or absent, bilaterally. Venous sinuses: Within the limitations of contrast timing, no convincing thrombus. Anatomic variants: As described. Review of the MIP images confirms the above findings IMPRESSION: Non-contrast head CT: 1.  No evidence of an acute intracranial abnormality. 2. Known chronic right PCA territory infarct. 3. Background parenchymal atrophy and mild cerebral white matter chronic small vessel ischemic disease. CTA neck: 1. The common carotid and internal carotid arteries are patent within the neck. Atherosclerotic plaque bilaterally as  described within body of the report, and most notably as follows. Up to 40% stenosis of the proximal cervical left ICA. Up to 50% stenosis of the mid cervical left ICA. 30% stenosis of the distal cervical left ICA. 2. The vertebral arteries are patent within the neck. Atherosclerotic plaque within the left vertebral artery at the V1/V2 junction with mild stenosis. 3. Aortic Atherosclerosis (ICD10-I70.0) and Emphysema (ICD10-J43.9). 4. Nonacute fracture deformity of the posterolateral right third rib. 5. Chronic fracture deformity of the distal right clavicle. 6. Cervical spondylosis. CTA head: 1. No proximal intracranial large vessel occlusion identified. 2. Intracranial atherosclerotic disease with multifocal stenoses, most notably as follows. 3. Moderate/severe stenoses of the cavernous/paraclinoid internal carotid arteries, bilaterally. 4. Severe stenosis of a proximal M2 right middle cerebral artery vessel. 5. Up to moderate stenosis of the proximal basilar artery. 6. Attenuated appearance of distal right posterior cerebral artery branches. Electronically Signed   By: Jackey Loge D.O.   On: 10/17/2023 14:36   EEG adult Result Date: 10/17/2023 Jefferson Fuel, MD     10/17/2023 11:00 AM Routine EEG Report Anne Phillips is a  73 y.o. female with a history of seizure who is undergoing an EEG to evaluate for seizures. Report: This EEG was acquired with electrodes placed according to the International 10-20 electrode system (including Fp1, Fp2, F3, F4, C3, C4, P3, P4, O1, O2, T3, T4, T5, T6, A1, A2, Fz, Cz, Pz). The following electrodes were missing or displaced: none. The occipital dominant rhythm was 10 Hz. This activity is reactive to stimulation. Drowsiness was manifested by background fragmentation; deeper stages of sleep were identified by K complexes and sleep spindles. There was no focal slowing. There were no interictal epileptiform discharges. There were no electrographic seizures identified. There  was no abnormal response to photic stimulation or hyperventilation. Impression: This EEG was obtained while awake and asleep and is normal.   Clinical Correlation: Normal EEGs, however, do not rule out epilepsy. Bing Neighbors, MD [facility] [phone] If 7pm- 7am, please page neurology on call as listed in AMION.   MR BRAIN WO CONTRAST Result Date: 10/16/2023 CLINICAL DATA:  Neuro deficit, acute, stroke suspected EXAM: MRI HEAD WITHOUT CONTRAST TECHNIQUE: Multiplanar, multiecho pulse sequences of the brain and surrounding structures were obtained without intravenous contrast. COMPARISON:  Same day CT head. FINDINGS: Motion limited and incomplete study due to patient intolerance. Brain: No acute infarction, hemorrhage, hydrocephalus, extra-axial collection or mass lesion. Remote right occipital infarct (PCA territory). Vascular: Major arterial flow voids are maintained skull base. Skull and upper cervical spine: Normal marrow signal. Sinuses/Orbits: Clear sinuses.  No acute orbital findings. Other: No mastoid effusions. IMPRESSION: 1. Motion limited and incomplete study without obvious acute abnormality. 2. Remote right PCA territory infarct. Electronically Signed   By: Feliberto Harts M.D.   On: 10/16/2023 23:45   US Venous Img Upper Uni Left (DVT) Result Date: 10/16/2023 CLINICAL DATA:  [id number] Left upper extremity swelling [id number] EXAM: Left UPPER EXTREMITY VENOUS DOPPLER ULTRASOUND TECHNIQUE: Gray-scale sonography with graded compression, as well as color Doppler and duplex ultrasound were performed to evaluate the upper extremity deep venous system from the level of the subclavian vein and including the jugular, axillary, basilic, radial, ulnar and upper cephalic vein. Spectral Doppler was utilized to evaluate flow at rest and with distal augmentation maneuvers. COMPARISON:  None Available. FINDINGS: Contralateral Subclavian Vein: Respiratory phasicity is normal and symmetric with the  symptomatic side. No evidence of thrombus. Normal compressibility. Internal Jugular Vein: No evidence of thrombus. Normal compressibility, respiratory phasicity and response to augmentation. Subclavian Vein: No evidence of thrombus. Normal compressibility, respiratory phasicity and response to augmentation. Axillary Vein: No evidence of thrombus. Normal compressibility, respiratory phasicity and response to augmentation. Cephalic Vein: Not visualized. Basilic Vein: No evidence of thrombus. Normal compressibility, respiratory phasicity and response to augmentation. Brachial Veins: Not visualized. Radial Veins: Not visualized Ulnar Veins: Not visualized. Venous Reflux:  None visualized. Other Findings:  Subcutaneus soft tissue edema. IMPRESSION: No evidence of DVT within the left upper extremity with markedly limited evaluation: Cephalic, brachial, radial, ulnar veins not visualized. Electronically Signed   By: Tish Frederickson M.D.   On: 10/16/2023 20:14   ECHOCARDIOGRAM COMPLETE Result Date: 10/16/2023    ECHOCARDIOGRAM REPORT   Patient Name:   Anne Phillips Date of Exam: 10/16/2023 Medical Rec #:  [id number]        Height:       60.0 in Accession #:    [id number]       Weight:       130.1 lb Date of Birth:  Nov 18, 1950  BSA:          1.555 m Patient Age:    72 years         BP:           181/92 mmHg Patient Gender: F                HR:           64 bpm. Exam Location:  [facility] Procedure: 2D Echo, Cardiac Doppler and Color Doppler (Both Spectral and Color            Flow Doppler were utilized during procedure). Indications:     TIA G45.9  History:         Patient has prior history of Echocardiogram examinations. CAD;                  Risk Factors:Hypertension.  Sonographer:     Neysa Bonito Roar Referring Phys:  [id number] Francoise Schaumann NEWTON Diagnosing Phys: Debbe Odea MD IMPRESSIONS  1. Left ventricular ejection fraction, by estimation, is 55 to 60%. The left ventricle has normal function. The left ventricle has no  regional wall motion abnormalities. There is moderate concentric left ventricular hypertrophy. Left ventricular diastolic parameters are consistent with Grade II diastolic dysfunction (pseudonormalization).  2. Right ventricular systolic function is low normal. The right ventricular size is normal. There is moderately elevated pulmonary artery systolic pressure.  3. Left atrial size was moderately dilated.  4. Right atrial size was mildly dilated.  5. A small pericardial effusion is present. The pericardial effusion is circumferential.  6. The mitral valve is normal in structure. Mild mitral valve regurgitation.  7. Tricuspid valve regurgitation is mild to moderate.  8. The aortic valve is tricuspid. Aortic valve regurgitation is mild. Aortic valve sclerosis is present, with no evidence of aortic valve stenosis.  9. The inferior vena cava is normal in size with <50% respiratory variability, suggesting right atrial pressure of 8 mmHg. FINDINGS  Left Ventricle: Left ventricular ejection fraction, by estimation, is 55 to 60%. The left ventricle has normal function. The left ventricle has no regional wall motion abnormalities. Strain imaging was not performed. The left ventricular internal cavity  size was normal in size. There is moderate concentric left ventricular hypertrophy. Left ventricular diastolic parameters are consistent with Grade II diastolic dysfunction (pseudonormalization). Right Ventricle: The right ventricular size is normal. No increase in right ventricular wall thickness. Right ventricular systolic function is low normal. There is moderately elevated pulmonary artery systolic pressure. The tricuspid regurgitant velocity  is 3.24 m/s, and with an assumed right atrial pressure of 8 mmHg, the estimated right ventricular systolic pressure is 50.0 mmHg. Left Atrium: Left atrial size was moderately dilated. Right Atrium: Right atrial size was mildly dilated. Pericardium: A small pericardial effusion is  present. The pericardial effusion is circumferential. Mitral Valve: The mitral valve is normal in structure. Mild mitral valve regurgitation. MV peak gradient, 16.3 mmHg. The mean mitral valve gradient is 7.0 mmHg. Tricuspid Valve: The tricuspid valve is normal in structure. Tricuspid valve regurgitation is mild to moderate. Aortic Valve: The aortic valve is tricuspid. Aortic valve regurgitation is mild. Aortic regurgitation PHT measures 472 msec. Aortic valve sclerosis is present, with no evidence of aortic valve stenosis. Aortic valve mean gradient measures 7.0 mmHg. Aortic valve peak gradient measures 13.8 mmHg. Aortic valve area, by VTI measures 1.03 cm. Pulmonic Valve: The pulmonic valve was normal in structure. Pulmonic valve regurgitation is mild. Aorta: The aortic root and ascending aorta  are structurally normal, with no evidence of dilitation. Venous: The inferior vena cava is normal in size with less than 50% respiratory variability, suggesting right atrial pressure of 8 mmHg. IAS/Shunts: No atrial level shunt detected by color flow Doppler. Additional Comments: 3D imaging was not performed.  LEFT VENTRICLE PLAX 2D LVIDd:         3.50 cm   Diastology LVIDs:         2.50 cm   LV e' medial:    4.35 cm/s LV PW:         1.60 cm   LV E/e' medial:  34.0 LV IVS:        2.10 cm   LV e' lateral:   7.94 cm/s LVOT diam:     1.70 cm   LV E/e' lateral: 18.6 LV SV:         41 LV SV Index:   26 LVOT Area:     2.27 cm  RIGHT VENTRICLE RV Basal diam:  3.60 cm RV Mid diam:    2.60 cm RV S prime:     8.59 cm/s TAPSE (M-mode): 0.9 cm LEFT ATRIUM           Index        RIGHT ATRIUM           Index LA diam:      4.30 cm 2.77 cm/m   RA Area:     21.40 cm LA Vol (A4C): 74.2 ml 47.73 ml/m  RA Volume:   58.60 ml  37.69 ml/m  AORTIC VALVE                     PULMONIC VALVE AV Area (Vmax):    1.05 cm      PV Vmax:          1.12 m/s AV Area (Vmean):   0.92 cm      PV Peak grad:     5.0 mmHg AV Area (VTI):     1.03 cm      PR  End Diast Vel: 13.84 msec AV Vmax:           186.00 cm/s   RVOT Peak grad:   1 mmHg AV Vmean:          121.000 cm/s AV VTI:            0.398 m AV Peak Grad:      13.8 mmHg AV Mean Grad:      7.0 mmHg LVOT Vmax:         85.90 cm/s LVOT Vmean:        49.000 cm/s LVOT VTI:          0.181 m LVOT/AV VTI ratio: 0.45 AI PHT:            472 msec  AORTA Ao Root diam: 2.60 cm Ao Asc diam:  2.90 cm MITRAL VALVE                TRICUSPID VALVE MV Area (PHT): 4.36 cm     TR Peak grad:   42.0 mmHg MV Area VTI:   1.21 cm     TR Vmax:        324.00 cm/s MV Peak grad:  16.3 mmHg MV Mean grad:  7.0 mmHg     SHUNTS MV Vmax:       2.02 m/s     Systemic VTI:  0.18 m MV Vmean:      117.0 cm/s  Systemic Diam: 1.70 cm MV Decel Time: 174 msec MV E velocity: 148.00 cm/s MV A velocity: 77.10 cm/s MV E/A ratio:  1.92 Debbe Odea MD Electronically signed by Debbe Odea MD Signature Date/Time: 10/16/2023/5:41:41 PM    Final    DG Abd 1 View Result Date: 10/16/2023 CLINICAL DATA:  16109 TIA (transient ischemic attack) [id number] EXAM: ABDOMEN - 1 VIEW COMPARISON:  08/24/2015 FINDINGS: The bowel gas pattern is nonobstructive. No radio-opaque calculi or other significant radiographic abnormality are seen. Prominent atherosclerotic vascular calcifications. IMPRESSION: Negative. Electronically Signed   By: Duanne Guess D.O.   On: 10/16/2023 16:02   DG Chest 1 View Result Date: 10/16/2023 CLINICAL DATA:  09811 TIA (transient ischemic attack) [id number] EXAM: CHEST  1 VIEW COMPARISON:  12/31/2022 FINDINGS: Moderate cardiomegaly. Aortic atherosclerosis. No focal airspace consolidation, pleural effusion, or pneumothorax. IMPRESSION: Moderate cardiomegaly. No acute cardiopulmonary findings. Electronically Signed   By: Duanne Guess D.O.   On: 10/16/2023 16:01   DG HIPS BILAT WITH PELVIS 2V Result Date: 10/16/2023 CLINICAL DATA:  91478 TIA (transient ischemic attack) [id number] EXAM: DG HIP (WITH OR WITHOUT PELVIS) 2V BILAT COMPARISON:  None  Available. FINDINGS: Sclerotic and lucent changes of the bilateral femoral heads suggesting avascular necrosis. Flattening of both femoral head contours, right worse than left. Hip joints remain aligned without dislocation. Mild to moderate degenerative changes bilaterally. No pelvic diastasis. Atherosclerotic vascular calcifications. 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 Flattening of both femoral head contours, right worse than left. Electronically Signed   By: Duanne Guess D.O.   On: 10/16/2023 16:00   CT Head Wo Contrast Result Date: 10/16/2023 CLINICAL DATA:  Seizure disorder, clinical change EXAM: CT HEAD WITHOUT CONTRAST TECHNIQUE: Contiguous axial images were obtained from the base of the skull through the vertex without intravenous contrast. RADIATION DOSE REDUCTION: This exam was performed according to the departmental dose-optimization program which includes automated exposure control, adjustment of the mA and/or kV according to patient size and/or use of iterative reconstruction technique. COMPARISON:  Head CT 12/31/2022 FINDINGS: Brain: No hemorrhage. No hydrocephalus. No extra-axial fluid collection. No mass effect. No mass lesion. 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 Skull: Normal. Negative for fracture or focal lesion. Sinuses/Orbits: No middle ear or mastoid effusion. Paranasal sinuses clear. Orbits are unremarkable Other: None. 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 If there is clinical concern for an acute infarct, consider further evaluation with MRI. Electronically Signed   By: Lorenza Cambridge M.D.   On: 10/16/2023 08:55    Microbiology: Results for orders placed or performed during the hospital encounter of  12/31/22  Culture, blood (Routine X 2) w Reflex to ID Panel     Status: None   Collection Time: 01/04/23  5:53 PM   Specimen: BLOOD LEFT HAND  Result Value Ref Range Status   Specimen Description BLOOD LEFT HAND  Final   Special Requests   Final    BOTTLES DRAWN AEROBIC AND ANAEROBIC Blood Culture results may not be optimal due to an excessive volume of blood received in culture bottles   Culture   Final    NO GROWTH 5 DAYS Performed at [facility], [address], [place], [place] [address]    Report Status 01/09/2023 FINAL  Final  Culture, blood (Routine X 2) w Reflex to  ID Panel     Status: None   Collection Time: 01/04/23  6:01 PM   Specimen: BLOOD  Result Value Ref Range Status   Specimen Description BLOOD RIGHT ANTECUBITAL  Final   Special Requests   Final    BOTTLES DRAWN AEROBIC AND ANAEROBIC Blood Culture adequate volume   Culture   Final    NO GROWTH 5 DAYS Performed at [facility], [address] [place], [place] [address]    Report Status 01/09/2023 FINAL  Final    Labs: CBC: Recent Labs  Lab 10/16/23 0712 10/19/23 0705  WBC 7.5 9.2  NEUTROABS 6.3 7.9*  HGB 11.0* 9.8*  HCT 33.2* 29.1*  MCV 87.8 86.6  PLT 184 147*   Basic Metabolic Panel: Recent Labs  Lab 10/16/23 0712 10/16/23 2224 10/17/23 0650 10/18/23 0537 10/19/23 0705  NA 133*  --  133* 128* 128*  K 2.3* 3.2* 3.2* 4.1 4.0  CL 89*  --  92* 93* 94*  CO2 30  --  29 26 27   GLUCOSE 101*  --  155* 103* 89  BUN 14  --  18 33* 32*  CREATININE 1.27*  --  1.12* 1.27* 1.27*  CALCIUM 7.5*  --  7.9* 8.2* 7.8*  MG 1.0*  --  3.0*  --  1.9  PHOS  --   --  3.4  --  2.0*   Liver Function Tests: Recent Labs  Lab 10/16/23 0712 10/19/23 0705  AST 26 23  ALT 13 19  ALKPHOS 62 49  BILITOT 1.0 0.8  PROT 7.4 5.9*  ALBUMIN 3.7 2.9*   CBG: Recent Labs  Lab 10/16/23 0709  GLUCAP 115*    Discharge time spent: greater than 30 minutes.  Signed: Lucile Shutters, MD [facility] 10/19/2023

## 2023-10-19 NOTE — Progress Notes (Signed)
Pharmacy Meds to [facility]  Medications that [facility] pharmacy filled were delivered to patients bedside and reviewed with patient. Per Pharmacy fill history reviewed by [facility] community pharmacist pt, had a mail order Rx of Keppra sent in December 20204 (90 day supply) and had Vimpat listed as picked up 09/27/23. Patient says she did not receive mail order and did not pick up Vimpat. [facility] pharmacy has set up mail order for these. Patient requested medications be sent to [address] [place] [address]. Explained to patient that medications could actually be delivered straight to her house/apt but patient declined and wanted to use [address]. She said she has family that can pick up from [address]   Bari Mantis PharmD Clinical Pharmacist 10/19/2023

## 2023-10-19 NOTE — Progress Notes (Signed)
   10/19/23 1700  What Happened  Was fall witnessed? Yes  Who witnessed fall? staff  Patients activity before fall  (getting up from chair to go to the bathroom, would not wait for NT to put feet down on chair, and slid off the end of the chair to the floor)  Point of contact buttocks  Was patient injured? No  Provider Notification  Provider Name/Title Dr Joylene Igo  Date Provider Notified 10/19/23  Time Provider Notified 1540  Method of Notification Call (secure chat in progress, then phone call)  Notification Reason Fall  Provider response See new orders  Date of Provider Response 10/19/23  Time of Provider Response 1540  Follow Up  Family notified No - patient refusal (no answer)  Time family notified 1555  Additional tests No  Simple treatment Other (comment)  Progress note created (see row info) Yes  Adult Fall Risk Assessment  Risk Factor Category (scoring not indicated) Fall has occurred during this admission (document High fall risk)  Patient Fall Risk Level High fall risk  Adult Fall Risk Interventions  Required Bundle Interventions *See Row Information* High fall risk - low, moderate, and high requirements implemented  Additional Interventions Use of appropriate toileting equipment (bedpan, BSC, etc.)  Screening for Fall Injury Risk (To be completed on HIGH fall risk patients) - Assessing Need for Floor Mats  Risk For Fall Injury- Criteria for Floor Mats Noncompliant with safety precautions  Will Implement Floor Mats Yes  Pain Assessment  Pain Scale 0-10  Pain Score 0  Neurological  Neuro (WDL) WDL  Level of Consciousness Alert  Orientation Level Oriented X4  Glasgow Coma Scale  Eye Opening 4  Best Verbal Response (NON-intubated) 5  Best Motor Response 6  Glasgow Coma Scale Score 15  Musculoskeletal  Musculoskeletal (WDL) X  Assistive Device Front wheel walker  Generalized Weakness Yes  Integumentary  Integumentary (WDL) X  Skin Color Appropriate for  ethnicity  Skin Condition Dry;Flaky  Skin Integrity Intact  Skin Turgor Non-tenting

## 2023-10-19 NOTE — Progress Notes (Addendum)
PHARMACY CONSULT NOTE - ELECTROLYTES  Pharmacy Consult for Electrolyte Monitoring and Replacement   Recent Labs: Height: 5\' 3"  (160 cm) Weight: 50.8 kg (111 lb 15.9 oz) IBW/kg (Calculated) : 52.4 Estimated Creatinine Clearance: 32.6 mL/min (A) (by C-G formula based on SCr of 1.27 mg/dL (H)). Potassium (mmol/L)  Date Value  10/19/2023 4.0   Magnesium (mg/dL)  Date Value  21/30/8657 1.9   Calcium (mg/dL)  Date Value  84/69/6295 7.8 (L)   Albumin (g/dL)  Date Value  28/41/3244 2.9 (L)   Phosphorus (mg/dL)  Date Value  09/03/7251 2.0 (L)   Sodium (mmol/L)  Date Value  10/19/2023 128 (L)   Albumin Corrected Calcium:  8.7    (Ca 7.8  albumin 2.9)  Assessment  Anne Phillips is a 73 y.o. female presenting with intermittent seizure episiodes. PMH significant for hypertension, hyperlipidemia, CAD, CHF, CKD3, and seizures. Pharmacy has been consulted to monitor and replace electrolytes.    MIVF: none Pertinent medications: calcium carb 500mg  po BID, losartan 100 mg po daily  Goal of Therapy: Electrolytes WNL  Plan:  Phos 2.0  Will order KPhos neutral 2 tabs po x 1 F/u with AM labs.  Thank you for allowing pharmacy to be a part of this patient's care.  Angelique Blonder, PharmD Clinical Pharmacist 10/19/2023 8:12 AM

## 2023-10-20 ENCOUNTER — Other Ambulatory Visit: Payer: Self-pay

## 2023-10-20 DIAGNOSIS — G40909 Epilepsy, unspecified, not intractable, without status epilepticus: Secondary | ICD-10-CM | POA: Diagnosis not present

## 2023-10-20 LAB — CBC WITH DIFFERENTIAL/PLATELET
Abs Immature Granulocytes: 0.04 10*3/uL (ref 0.00–0.07)
Basophils Absolute: 0 10*3/uL (ref 0.0–0.1)
Basophils Relative: 0 %
Eosinophils Absolute: 0.1 10*3/uL (ref 0.0–0.5)
Eosinophils Relative: 1 %
HCT: 30.5 % — ABNORMAL LOW (ref 36.0–46.0)
Hemoglobin: 10.3 g/dL — ABNORMAL LOW (ref 12.0–15.0)
Immature Granulocytes: 1 %
Lymphocytes Relative: 5 %
Lymphs Abs: 0.4 10*3/uL — ABNORMAL LOW (ref 0.7–4.0)
MCH: 29.2 pg (ref 26.0–34.0)
MCHC: 33.8 g/dL (ref 30.0–36.0)
MCV: 86.4 fL (ref 80.0–100.0)
Monocytes Absolute: 0.9 10*3/uL (ref 0.1–1.0)
Monocytes Relative: 11 %
Neutro Abs: 7 10*3/uL (ref 1.7–7.7)
Neutrophils Relative %: 82 %
Platelets: 149 10*3/uL — ABNORMAL LOW (ref 150–400)
RBC: 3.53 MIL/uL — ABNORMAL LOW (ref 3.87–5.11)
RDW: 15.7 % — ABNORMAL HIGH (ref 11.5–15.5)
WBC: 8.5 10*3/uL (ref 4.0–10.5)
nRBC: 0 % (ref 0.0–0.2)

## 2023-10-20 LAB — MAGNESIUM: Magnesium: 1.7 mg/dL (ref 1.7–2.4)

## 2023-10-20 LAB — COMPREHENSIVE METABOLIC PANEL
ALT: 18 U/L (ref 0–44)
AST: 17 U/L (ref 15–41)
Albumin: 2.9 g/dL — ABNORMAL LOW (ref 3.5–5.0)
Alkaline Phosphatase: 50 U/L (ref 38–126)
Anion gap: 10 (ref 5–15)
BUN: 36 mg/dL — ABNORMAL HIGH (ref 8–23)
CO2: 24 mmol/L (ref 22–32)
Calcium: 8.1 mg/dL — ABNORMAL LOW (ref 8.9–10.3)
Chloride: 94 mmol/L — ABNORMAL LOW (ref 98–111)
Creatinine, Ser: 1.17 mg/dL — ABNORMAL HIGH (ref 0.44–1.00)
GFR, Estimated: 50 mL/min — ABNORMAL LOW (ref >60)
Glucose, Bld: 83 mg/dL (ref 70–99)
Potassium: 4.2 mmol/L (ref 3.5–5.1)
Sodium: 128 mmol/L — ABNORMAL LOW (ref 135–145)
Total Bilirubin: 0.6 mg/dL (ref 0.0–1.2)
Total Protein: 6.1 g/dL — ABNORMAL LOW (ref 6.5–8.1)

## 2023-10-20 LAB — PHOSPHORUS: Phosphorus: 2.7 mg/dL (ref 2.5–4.6)

## 2023-10-20 LAB — LEVETIRACETAM LEVEL: Levetiracetam Lvl: 74.4 ug/mL — ABNORMAL HIGH (ref 10.0–40.0)

## 2023-10-20 MED ORDER — MAGNESIUM SULFATE 2 GM/50ML IV SOLN: 2.0000 g | Freq: Once | INTRAVENOUS | Status: DC

## 2023-10-20 MED ORDER — MAGNESIUM OXIDE -MG SUPPLEMENT 400 (240 MG) MG PO TABS
800.0000 mg | ORAL_TABLET | Freq: Once | ORAL | Status: AC
  Administered 2023-10-20: 800 mg via ORAL
  Filled 2023-10-20: qty 2

## 2023-10-20 NOTE — Progress Notes (Signed)
Attempted to call pt sister per pt request for d/c transportation. Called A. Harrelson no answer mailbox full. Called T. Turner left voicemail for return call.

## 2023-10-20 NOTE — TOC Transition Note (Signed)
Transition of Care [facility]) - Discharge Note   Patient Details  Name: Anne Phillips MRN: [id number] Date of Birth: Apr 17, 1951  Transition of Care [facility] CM/SW Contact:  Allena Katz, LCSW Phone Number: 10/20/2023, 10:55 AM   Clinical Narrative:    Pt discharging home. Still declining HH. Csw spoke with teresa who states they want ems and that her brother is home. CSW to call ems.   Final next level of care: Home/Self Care Barriers to Discharge: Barriers Resolved   Patient Goals and CMS Choice Patient states their goals for this hospitalization and ongoing recovery are:: go home CMS Medicare.gov Compare Post Acute Care list provided to:: Patient        Discharge Placement                Patient to be transferred to facility by: acems Name of family member notified: teresa Patient and family notified of of transfer: 10/20/23  Discharge Plan and Services Additional resources added to the After Visit Summary for                                       Social Drivers of Health (SDOH) Interventions SDOH Screenings   Food Insecurity: Patient Declined (10/16/2023)  Housing: Patient Declined (10/16/2023)  Transportation Needs: Patient Declined (10/16/2023)  Utilities: Patient Declined (10/16/2023)  Financial Resource Strain: Low Risk  (07/08/2022)   [facility], [facility]  Physical Activity: Insufficiently Active (11/20/2020)   Received from [facility], [facility]  Social Connections: Patient Declined (10/16/2023)  Stress: No Stress Concern Present (01/20/2022)   Received from [facility], [facility]  Tobacco Use: High Risk (10/16/2023)  Health Literacy: Low Risk  (01/21/2023)   Received from [facility]     Readmission Risk Interventions    10/12/2022   11:51 AM  Readmission Risk Prevention Plan  PCP or Specialist Appt within 3-5 Days Complete  Social Work Consult for Recovery Care Planning/Counseling Complete   Palliative Care Screening Not Applicable

## 2023-10-20 NOTE — Progress Notes (Signed)
Progress Note   Patient: Anne Phillips ZOX:[id number] DOB: 01/27/1951 DOA: 10/16/2023     0 DOS: the patient was seen and examined on 10/20/2023   Brief hospital course:  Anne Phillips is 73 y.o. female with seizure disorder, hypertension, CAD, CKD stage III, hyperlipidemia, history of CVA in April, who presents encephalopathic and per report patient had seizure at home. In the ER patient had witnessed tonic-clonic seizure, was given IV Keppra, lacosamide, and Ativan.  Head CT revealed age-indeterminate right occipital lobe infarct.  EEG and MRI were ordered and patient was admitted. MRI revealed remote PCA infarct without acute abnormalities.  Blood pressure was found to be very elevated, and she was started on antihypertensives.  EEG was unremarkable.  Neurology was consulted and recommends restarting all home medications. On outpatient pharmacy review it appears that patient was not taking her antihypertensives, antiepileptics, or methimazole.       Assessment and Plan:   Seizure disorder with breakthrough seizure - Neurology was consulted - Seizure thought to be secondary to medication nonadherence, patient reported that she had not been taking her medications  - MRI and EEG without significant abnormalities - Vimpat and Keppra levels: pending --Keppra and Vimpat resumed at prior doses per neurology recommendations - Pharmacy to deliver meds to bed    Acute metabolic encephalopathy - Secondary to seizure as above - Patient appears to be at her mental status baseline now, alert and oriented x 4   CKD stage IIIa - Creatinine 1.27, GFR in 40s - Currently at baseline - Status post IV fluids - Continue to monitor CMP - Hold nephrotoxic agents - Renally dosed with a creatinine clearance of 32 when needed   Atrial fibrillation - This appears to be new onset EKG on arrival - Repeat EKG revealed PACs - Echocardiogram reveals normal EF, grade 2 diastolic dysfunction, and mild  to moderate right atrial dilation - CHA2DS2-VASc: 7, patient would benefit from anticoagulation, however given her inconsistent medication adherence, high fall risk, and frequent seizures she is a poor candidate. 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 - No chest pain currently - Her EKG with A-fib followed by some PACs - Continue home meds   Hypothyroidism - Patient meant to be taking methimazole outpatient but is noncompliant - T4 is elevated with low TSH - Restart home dose methimazole   Hypokalemia Hypomagnesemia - Replace as needed   Hypertension - Continue carvedilol and losartan   Dyslipidemia - Continue statin   Hyponatremia -acute worsening since arrival, hold newly added spironolactone - On chart review it appears that this is a chronic problem and previously presumed secondary to poor oral intake -Continue to hold spironolactone   Medication nonadherence - This is a chronic and ongoing problem - Patient is a poor historian with multiple social determinants of health needs    Heart failure with preserved EF - Continue carvedilol and losartan - Euvolemic currently -- Echocardiogram reveals normal EF, grade 2 diastolic dysfunction, and mild to moderate right atrial dilation      Frequent falls Patient with a history of frequent falls Seen and evaluated by physical therapy and they recommend home health PT which patient declines  Subjective: Patient is seen and examined at the bedside.  Discharge was held 02/17 after patient fell prior to being discharged  Physical Exam: Vitals:   10/19/23 1943 10/19/23 2357 10/20/23 0330 10/20/23 0816  BP: (!) 179/80 126/65 (!) 169/73 114/80  Pulse: 68 72 74 81  Resp:  16   16  Temp: 98.4 F (36.9 C) 98.3 F (36.8 C) 98.5 F (36.9 C) 98.2 F (36.8 C)  TempSrc: Oral     SpO2: 97% 100% 98% 100%  Weight:      Height:       General exam: Appears calm and comfortable, NAD  Respiratory system: No work of breathing, symmetric chest wall expansion Cardiovascular system: S1 & S2 heard, RRR.  Gastrointestinal system: Abdomen is nondistended, soft and nontender.  Neuro: Alert and orientedx4. No focal neurological deficits. Extremities: Symmetric, expected ROM Skin: No rashes, lesions Psychiatry: Demonstrates appropriate judgement and insight. Mood & affect appropriate for situation.    Data Reviewed: Labs reviewed.  Sodium 128.  Chronic There are no new results to review at this time.  Family Communication: Plan of care discussed with patient in detail.  Explained to her in detail the need for home health PT on discharge which she declines.  Disposition: Status is: Observation The patient remains OBS appropriate and will d/c before 2 midnights.  Planned Discharge Destination: Home    Time spent: 33 minutes  Author: Lucile Shutters, MD 10/20/2023 10:35 AM  For on call review [email or web address].

## 2023-10-20 NOTE — Progress Notes (Signed)
PHARMACY CONSULT NOTE - ELECTROLYTES  Pharmacy Consult for Electrolyte Monitoring and Replacement   Recent Labs: Height: 5\' 3"  (160 cm) Weight: 50.8 kg (111 lb 15.9 oz) IBW/kg (Calculated) : 52.4 Estimated Creatinine Clearance: 35.4 mL/min (A) (by C-G formula based on SCr of 1.17 mg/dL (H)). Potassium (mmol/L)  Date Value  10/20/2023 4.2   Magnesium (mg/dL)  Date Value  16/06/9603 1.7   Calcium (mg/dL)  Date Value  54/05/8118 8.1 (L)   Albumin (g/dL)  Date Value  14/78/2956 2.9 (L)   Phosphorus (mg/dL)  Date Value  21/30/8657 2.7   Sodium (mmol/L)  Date Value  10/20/2023 128 (L)   Assessment  Anne Phillips is a 73 y.o. female presenting with intermittent seizure episiodes. PMH significant for hypertension, hyperlipidemia, CAD, CHF, CKD, and seizures. Pharmacy has been consulted to monitor and replace electrolytes. Scr trending up. Na trending down.    MIVF: none Pertinent medications:  calcium carb 200mg  po BID, losartan 100mg  podaily  Goal of Therapy: Electrolytes WNL  Plan:  Mag 1.7   Will order Magnesium sulfate 2 gm IV x1 F/u with AM labs.  Thank you for allowing pharmacy to be a part of this patient's care.  Angelique Blonder, PharmD Clinical Pharmacist 10/20/2023 7:40 AM

## 2023-10-20 NOTE — Evaluation (Signed)
Physical Therapy Re-Evaluation Patient Details Name: Anne Phillips MRN: [id number] DOB: [birth year] Today's Date: 10/20/2023  History of Present Illness  Anne Phillips is a 73 y.o. female with medical history significant of seizure disorder, HTN, CAD, stage 3 CKD, HLD, and CVA presenting w/ seizure, encephalopathy, suspected CVA. MD assessment includes: seizure disorder with breakthrough seizure, acute metabolic encephalopathy, hypokalemia, hyponatremia, medication nonadherence, and frequent falls.   Clinical Impression  PT consulted secondary to fall from chair on 10/19/23.  Pt was pleasant and motivated to participate during the session and put forth good effort throughout. Pt required increased time, effort, and use of the bed rails with all bed mobility tasks but no physical assist.  Pt was able to stand from EOB, w/c, and standard toilet with extra effort but without assist with cues for hand placement.  Pt was able to perform two bouts of ambulation, each around 30 feet, with reciprocal pattern with the RW with slow cadence but without overt LOB.  Pt able to ascend and descend two steps with bilateral rails with cues for step-to pattern with her RLE being her weaker LE from prior CVA.  Pt required heavy lean on the rails but was generally steady during stair training with no buckling or LOB noted.  Pt will benefit from continued PT services upon discharge to safely address deficits listed in patient problem list for decreased caregiver assistance and eventual return to PLOF.          If plan is discharge home, recommend the following: A little help with walking and/or transfers;Help with stairs or ramp for entrance;A little help with bathing/dressing/bathroom;Assistance with cooking/housework;Assist for transportation   Can travel by private vehicle        Equipment Recommendations None recommended by PT  Recommendations for Other Services       Functional Status Assessment Patient  has had a recent decline in their functional status and demonstrates the ability to make significant improvements in function in a reasonable and predictable amount of time.     Precautions / Restrictions Precautions Precautions: Fall Recall of Precautions/Restrictions: Intact Restrictions Weight Bearing Restrictions Per Provider Order: No      Mobility  Bed Mobility Overal bed mobility: Modified Independent             General bed mobility comments: Extra time, effort, and use of bed rails only    Transfers Overall transfer level: Needs assistance Equipment used: Rolling walker (2 wheels) Transfers: Sit to/from Stand Sit to Stand: Supervision           General transfer comment: Pt able to stand without assist from bed, w/c, and standard height toilet with min extra time and effort    Ambulation/Gait Ambulation/Gait assistance: Supervision Gait Distance (Feet): 30 Feet x 2 Assistive device: Rolling walker (2 wheels) Gait Pattern/deviations: Step-through pattern, Decreased step length - right, Decreased step length - left, Trunk flexed Gait velocity: decreased     General Gait Details: Slow cadence and short B step length with mod multi-modal cues for amb closer to the RW and to stay within the RW during sharp turns, no overt LOB  Stairs Stairs: Yes Stairs assistance: Contact guard assist Stair Management: Two rails, Step to pattern Number of Stairs: 2 General stair comments: Mod multi-modal cues for sequencing  Wheelchair Mobility     Tilt Bed    Modified Rankin (Stroke Patients Only)       Balance Overall balance assessment: Needs assistance, History of Falls  Sitting balance-Leahy Scale: Good     Standing balance support: Bilateral upper extremity supported, During functional activity, Reliant on assistive device for balance Standing balance-Leahy Scale: Fair                               Pertinent Vitals/Pain Pain  Assessment Pain Assessment: No/denies pain    Home Living Family/patient expects to be discharged to:: Private residence Living Arrangements: Other relatives (brother) Available Help at Discharge: Family;Available PRN/intermittently Type of Home: House Home Access: Stairs to enter Entrance Stairs-Rails: Right;Left;Can reach both Entrance Stairs-Number of Steps: 2   Home Layout: One level Home Equipment: Agricultural consultant (2 wheels);Wheelchair - manual Additional Comments: lives with her brother who does not provide assistance to patient per patient report    Prior Function Prior Level of Function : Needs assist;History of Falls (last six months)             Mobility Comments: Pt stated that she primarily uses a wheelchair at home but is able to amb limited household distances, several falls in the last 6 months ADLs Comments: Ind with ADLs     Extremity/Trunk Assessment   Upper Extremity Assessment Upper Extremity Assessment: Generalized weakness    Lower Extremity Assessment Lower Extremity Assessment: Generalized weakness       Communication   Communication Communication: No apparent difficulties    Cognition Arousal: Alert Behavior During Therapy: WFL for tasks assessed/performed   PT - Cognitive impairments: No apparent impairments                         Following commands: Intact       Cueing Cueing Techniques: Verbal cues, Visual cues, Tactile cues     General Comments      Exercises     Assessment/Plan    PT Assessment Patient needs continued PT services  PT Problem List Decreased strength;Decreased activity tolerance;Decreased balance;Decreased mobility;Decreased knowledge of use of DME       PT Treatment Interventions DME instruction;Gait training;Stair training;Functional mobility training;Therapeutic activities;Therapeutic exercise;Balance training;Patient/family education    PT Goals (Current goals can be found in the Care Plan  section)  Acute Rehab PT Goals Patient Stated Goal: to return home PT Goal Formulation: With patient Time For Goal Achievement: 11/02/23 Potential to Achieve Goals: Good    Frequency Min 1X/week     Co-evaluation               AM-PAC PT "6 Clicks" Mobility  Outcome Measure Help needed turning from your back to your side while in a flat bed without using bedrails?: A Little Help needed moving from lying on your back to sitting on the side of a flat bed without using bedrails?: A Little Help needed moving to and from a bed to a chair (including a wheelchair)?: A Little Help needed standing up from a chair using your arms (e.g., wheelchair or bedside chair)?: A Little Help needed to walk in hospital room?: A Little Help needed climbing 3-5 steps with a railing? : A Little 6 Click Score: 18    End of Session Equipment Utilized During Treatment: Gait belt Activity Tolerance: Patient tolerated treatment well Patient left: in bed;with bed alarm set;with call bell/phone within reach Nurse Communication: Mobility status PT Visit Diagnosis: History of falling (Z91.81);Difficulty in walking, not elsewhere classified (R26.2);Muscle weakness (generalized) (M62.81)    Time: 1610-9604 PT Time Calculation (min) (ACUTE ONLY):  23 min   Charges:   PT Evaluation $PT Re-evaluation: 1 Re-eval PT Treatments $Gait Training: 8-22 mins PT General Charges $$ ACUTE PT VISIT: 1 Visit    D. Scott Teresina Bugaj PT, DPT 10/20/23, 11:00 AM

## 2023-10-22 ENCOUNTER — Emergency Department: Payer: Medicare HMO

## 2023-10-22 ENCOUNTER — Observation Stay
Admission: EM | Admit: 2023-10-22 | Discharge: 2023-10-23 | Disposition: A | Discharge status: Home / Self Care | Payer: Medicare HMO | Attending: Internal Medicine | Admitting: Internal Medicine

## 2023-10-22 DIAGNOSIS — G40219 Localization-related (focal) (partial) symptomatic epilepsy and epileptic syndromes with complex partial seizures, intractable, without status epilepticus: Principal | ICD-10-CM

## 2023-10-22 DIAGNOSIS — I2510 Atherosclerotic heart disease of native coronary artery without angina pectoris: Secondary | ICD-10-CM | POA: Diagnosis present

## 2023-10-22 DIAGNOSIS — Z8673 Personal history of transient ischemic attack (TIA), and cerebral infarction without residual deficits: Secondary | ICD-10-CM | POA: Diagnosis not present

## 2023-10-22 DIAGNOSIS — R569 Unspecified convulsions: Secondary | ICD-10-CM | POA: Diagnosis present

## 2023-10-22 DIAGNOSIS — N1832 Chronic kidney disease, stage 3b: Secondary | ICD-10-CM | POA: Diagnosis not present

## 2023-10-22 DIAGNOSIS — Z7901 Long term (current) use of anticoagulants: Secondary | ICD-10-CM | POA: Insufficient documentation

## 2023-10-22 DIAGNOSIS — I5A Non-ischemic myocardial injury (non-traumatic): Secondary | ICD-10-CM | POA: Diagnosis not present

## 2023-10-22 DIAGNOSIS — F17210 Nicotine dependence, cigarettes, uncomplicated: Secondary | ICD-10-CM | POA: Insufficient documentation

## 2023-10-22 DIAGNOSIS — I5032 Chronic diastolic (congestive) heart failure: Secondary | ICD-10-CM | POA: Diagnosis present

## 2023-10-22 DIAGNOSIS — I130 Hypertensive heart and chronic kidney disease with heart failure and stage 1 through stage 4 chronic kidney disease, or unspecified chronic kidney disease: Secondary | ICD-10-CM | POA: Diagnosis not present

## 2023-10-22 DIAGNOSIS — E785 Hyperlipidemia, unspecified: Secondary | ICD-10-CM | POA: Diagnosis not present

## 2023-10-22 DIAGNOSIS — I639 Cerebral infarction, unspecified: Secondary | ICD-10-CM | POA: Diagnosis present

## 2023-10-22 DIAGNOSIS — E0590 Thyrotoxicosis, unspecified without thyrotoxic crisis or storm: Secondary | ICD-10-CM | POA: Diagnosis not present

## 2023-10-22 DIAGNOSIS — E871 Hypo-osmolality and hyponatremia: Secondary | ICD-10-CM | POA: Insufficient documentation

## 2023-10-22 DIAGNOSIS — Z79899 Other long term (current) drug therapy: Secondary | ICD-10-CM | POA: Diagnosis not present

## 2023-10-22 DIAGNOSIS — I4891 Unspecified atrial fibrillation: Secondary | ICD-10-CM | POA: Diagnosis present

## 2023-10-22 DIAGNOSIS — I10 Essential (primary) hypertension: Secondary | ICD-10-CM | POA: Diagnosis present

## 2023-10-22 DIAGNOSIS — R4182 Altered mental status, unspecified: Secondary | ICD-10-CM

## 2023-10-22 LAB — COMPREHENSIVE METABOLIC PANEL
ALT: 48 U/L — ABNORMAL HIGH (ref 0–44)
AST: 41 U/L (ref 15–41)
Albumin: 3.5 g/dL (ref 3.5–5.0)
Alkaline Phosphatase: 63 U/L (ref 38–126)
Anion gap: 14 (ref 5–15)
BUN: 33 mg/dL — ABNORMAL HIGH (ref 8–23)
CO2: 23 mmol/L (ref 22–32)
Calcium: 8.7 mg/dL — ABNORMAL LOW (ref 8.9–10.3)
Chloride: 89 mmol/L — ABNORMAL LOW (ref 98–111)
Creatinine, Ser: 1.39 mg/dL — ABNORMAL HIGH (ref 0.44–1.00)
GFR, Estimated: 41 mL/min — ABNORMAL LOW (ref >60)
Glucose, Bld: 102 mg/dL — ABNORMAL HIGH (ref 70–99)
Potassium: 3.7 mmol/L (ref 3.5–5.1)
Sodium: 126 mmol/L — ABNORMAL LOW (ref 135–145)
Total Bilirubin: 0.5 mg/dL (ref 0.0–1.2)
Total Protein: 7 g/dL (ref 6.5–8.1)

## 2023-10-22 LAB — CBC
HCT: 33 % — ABNORMAL LOW (ref 36.0–46.0)
Hemoglobin: 11 g/dL — ABNORMAL LOW (ref 12.0–15.0)
MCH: 29.3 pg (ref 26.0–34.0)
MCHC: 33.3 g/dL (ref 30.0–36.0)
MCV: 88 fL (ref 80.0–100.0)
Platelets: 195 10*3/uL (ref 150–400)
RBC: 3.75 MIL/uL — ABNORMAL LOW (ref 3.87–5.11)
RDW: 16 % — ABNORMAL HIGH (ref 11.5–15.5)
WBC: 8.8 10*3/uL (ref 4.0–10.5)
nRBC: 0 % (ref 0.0–0.2)

## 2023-10-22 LAB — TROPONIN I (HIGH SENSITIVITY)
Troponin I (High Sensitivity): 22 ng/L — ABNORMAL HIGH (ref <18)
Troponin I (High Sensitivity): 19 ng/L — ABNORMAL HIGH (ref <18)

## 2023-10-22 MED ORDER — LACOSAMIDE 50 MG PO TABS
150.0000 mg | ORAL_TABLET | Freq: Two times a day (BID) | ORAL | Status: DC
  Administered 2023-10-23 (×2): 150 mg via ORAL
  Filled 2023-10-22 (×2): qty 3

## 2023-10-22 MED ORDER — ONDANSETRON HCL 4 MG/2ML IJ SOLN: 4.0000 mg | Freq: Three times a day (TID) | INTRAMUSCULAR | Status: DC | PRN

## 2023-10-22 MED ORDER — SODIUM CHLORIDE 1 G PO TABS
1.0000 g | ORAL_TABLET | Freq: Two times a day (BID) | ORAL | Status: DC
  Administered 2023-10-23 (×2): 1 g via ORAL
  Filled 2023-10-22 (×2): qty 1

## 2023-10-22 MED ORDER — NICOTINE 21 MG/24HR TD PT24
21.0000 mg | MEDICATED_PATCH | Freq: Every day | TRANSDERMAL | Status: DC
  Filled 2023-10-22: qty 1

## 2023-10-22 MED ORDER — MELATONIN 5 MG PO TABS
5.0000 mg | ORAL_TABLET | Freq: Every day | ORAL | Status: DC
  Administered 2023-10-22: 5 mg via ORAL

## 2023-10-22 MED ORDER — LORAZEPAM 2 MG/ML IJ SOLN: 2.0000 mg | INTRAMUSCULAR | Status: DC | PRN

## 2023-10-22 MED ORDER — HYDRALAZINE HCL 20 MG/ML IJ SOLN: 5.0000 mg | INTRAMUSCULAR | Status: DC | PRN

## 2023-10-22 MED ORDER — ENOXAPARIN SODIUM 30 MG/0.3ML IJ SOSY
30.0000 mg | PREFILLED_SYRINGE | INTRAMUSCULAR | Status: DC
  Administered 2023-10-23: 30 mg via SUBCUTANEOUS
  Filled 2023-10-22: qty 0.3

## 2023-10-22 MED ORDER — ACETAMINOPHEN 325 MG PO TABS: 650.0000 mg | ORAL_TABLET | Freq: Four times a day (QID) | ORAL | Status: DC | PRN

## 2023-10-22 MED ORDER — LEVETIRACETAM IN NACL 1000 MG/100ML IV SOLN
1000.0000 mg | Freq: Once | INTRAVENOUS | Status: AC
  Administered 2023-10-22: 1000 mg via INTRAVENOUS
  Filled 2023-10-22: qty 100

## 2023-10-22 MED ORDER — SODIUM CHLORIDE 0.9 % IV BOLUS
1000.0000 mL | Freq: Once | INTRAVENOUS | Status: AC
  Administered 2023-10-22: 1000 mL via INTRAVENOUS

## 2023-10-22 MED ORDER — GABAPENTIN 300 MG CAPSULE
ORAL_CAPSULE | Freq: Two times a day (BID) | ORAL | 3 refills | 90.00 days | Status: CP
Start: 2023-10-22 — End: ?

## 2023-10-22 NOTE — ED Notes (Signed)
Pt assisted with toilet needs at this time

## 2023-10-22 NOTE — Progress Notes (Signed)
PHARMACIST - PHYSICIAN COMMUNICATION  CONCERNING:  Enoxaparin (Lovenox) for DVT Prophylaxis    RECOMMENDATION: Patient was prescribed enoxaprin 40mg  q24 hours for VTE prophylaxis.   Filed Weights   10/22/23 2031  Weight: 54.4 kg (120 lb)    Body mass index is 23.44 kg/m.  Estimated Creatinine Clearance: 26.7 mL/min (A) (by C-G formula based on SCr of 1.39 mg/dL (H)).  Patient is candidate for enoxaparin 30mg  every 24 hours based on CrCl <87ml/min or Weight <45kg  DESCRIPTION: Pharmacy has adjusted enoxaparin dose per [facility] policy.  Patient is now receiving enoxaparin 30 mg every 24 hours   Otelia Sergeant, PharmD, [facility] 10/22/2023 11:54 PM

## 2023-10-22 NOTE — ED Notes (Signed)
Pt assisted to the restroom at this time. Pt still with steady gait.

## 2023-10-22 NOTE — ED Provider Notes (Signed)
[facility] Provider Note    Event Date/Time   First MD Initiated Contact with Patient 10/22/23 2031     (approximate)   History   Seizures   HPI  Anne Phillips is a 73 y.o. female past medical history significant for seizure disorder on Keppra and Vimpat, CKD, recent diagnosis of atrial fibrillation not on anticoagulation, history of CVA, who presents to the emergency department following seizure-like activity.  History is provided by EMS.  States that her brother called EMS after she had a witnessed seizure that lasted for approximately 1 minute.  When EMS arrived she was not postictal and at her normal mental status baseline.  States that she has a history of seizures and she has had seizures since she was 16.  She is uncertain of what seizure medication she takes, believes one of them is Keppra, states that she does all of her home medications on her own.  Had a recent hospitalization and was discharged with multiple changes to her home medication list however patient states that she has not made this changes and that no one told her.  States that she has been feeling in her normal state of health this morning.  Complaining of some mild pain to her left arm which she states is her normal.     Physical Exam   Triage Vital Signs: ED Triage Vitals  Encounter Vitals Group     BP 10/22/23 2030 (!) 113/100     Systolic BP Percentile --      Diastolic BP Percentile --      Pulse Rate 10/22/23 2030 79     Resp --      Temp --      Temp src --      SpO2 --      Weight 10/22/23 2031 120 lb (54.4 kg)     Height 10/22/23 2031 5' (1.524 m)     Head Circumference --      Peak Flow --      Pain Score 10/22/23 2031 3     Pain Loc --      Pain Education --      Exclude from Growth Chart --     Most recent vital signs: Vitals:   10/22/23 2030  BP: (!) 113/100  Pulse: 79  Resp: 16  Temp: 97.6 F (36.4 C)  SpO2: 97%    Physical Exam Constitutional:       Appearance: She is well-developed.  HENT:     Head: Atraumatic.  Eyes:     Extraocular Movements: Extraocular movements intact.     Conjunctiva/sclera: Conjunctivae normal.     Pupils: Pupils are equal, round, and reactive to light.  Cardiovascular:     Rate and Rhythm: Regular rhythm.  Pulmonary:     Effort: No respiratory distress.  Abdominal:     General: There is no distension.     Tenderness: There is no abdominal tenderness.  Musculoskeletal:        General: Normal range of motion.     Cervical back: Normal range of motion.     Comments: Significant lymphedema to the left upper extremity (chronic according to the patient from breast cancer)  Skin:    General: Skin is warm.     Capillary Refill: Capillary refill takes less than 2 seconds.     Comments: Left breast mastectomy  Neurological:     General: No focal deficit present.     Mental Status:  She is alert and oriented to person, place, and time. Mental status is at baseline.     IMPRESSION / MDM / ASSESSMENT AND PLAN / ED COURSE  I reviewed the triage vital signs and the nursing notes.  Differential diagnosis including breakthrough seizure, electrolyte abnormality, dehydration, intracranial hemorrhage, dysrhythmia  On chart review patient had a recent hospitalization for altered mental status and acute encephalopathy that was thought to be secondary to seizures from noncompliance with her medication.  Patient is prescribed Vimpat at 150 mg twice daily, Keppra 500 mg twice daily   EKG  I, Corena Herter, the attending physician, personally viewed and interpreted this ECG.   Rate: Normal  Rhythm: Normal sinus  Axis: Normal  Intervals: Normal  ST&T Change: ST depression to the lateral leads with T wave that is inverted.  No significant change when compared to prior EKG 10/16/2023  No tachycardic or bradycardic dysrhythmias while on cardiac telemetry.  RADIOLOGY I independently reviewed imaging, my  interpretation of imaging: CT scan of the head -no signs of intracranial hemorrhage.  Read as no acute findings.  LABS (all labs ordered are listed, but only abnormal results are displayed) Labs interpreted as -    Labs Reviewed  CBC - Abnormal; Notable for the following components:      Result Value   RBC 3.75 (*)    Hemoglobin 11.0 (*)    HCT 33.0 (*)    RDW 16.0 (*)    All other components within normal limits  COMPREHENSIVE METABOLIC PANEL - Abnormal; Notable for the following components:   Sodium 126 (*)    Chloride 89 (*)    Glucose, Bld 102 (*)    BUN 33 (*)    Creatinine, Ser 1.39 (*)    Calcium 8.7 (*)    ALT 48 (*)    GFR, Estimated 41 (*)    All other components within normal limits  TROPONIN I (HIGH SENSITIVITY) - Abnormal; Notable for the following components:   Troponin I (High Sensitivity) 22 (*)    All other components within normal limits  TROPONIN I (HIGH SENSITIVITY) - Abnormal; Notable for the following components:   Troponin I (High Sensitivity) 19 (*)    All other components within normal limits  LEVETIRACETAM LEVEL  LACOSAMIDE     MDM    Patient was loaded with IV Keppra.  Concern that the patient had breakthrough seizure secondary to medication noncompliance.  Plan for lab work, Keppra load and workup for breakthrough seizure.  Patient is back to her mental status baseline.  Discussed with her brother over the phone who she lives with, states that she has not been acting her normal self since she came back from the hospital.  States that she has been more confused and not her normal.  She has not been eating and has decreased oral intake.  Patient answering most questions appropriately.  Does have hyponatremia which appears to be worsened from discharge.  Today sodium level is 126 and on chart review in the past has been in the 130s.  Clinical picture is not consistent with a meningitis.  Likely some aspect of polypharmacy.  Consulted hospitalist  for admission for altered mental status and hyponatremia in the setting of breakthrough seizure and medication noncompliance.   PROCEDURES:  Critical Care performed: No  Procedures  Patient's presentation is most consistent with acute presentation with potential threat to life or bodily function.   MEDICATIONS ORDERED IN ED: Medications  melatonin tablet 5 mg (  5 mg Oral Given 10/22/23 2327)  levETIRAcetam (KEPPRA) IVPB 1000 mg/100 mL premix (0 mg Intravenous Stopped 10/22/23 2150)  sodium chloride 0.9 % bolus 1,000 mL (1,000 mLs Intravenous New Bag/Given 10/22/23 2244)    FINAL CLINICAL IMPRESSION(S) / ED DIAGNOSES   Final diagnoses:  Seizure-like activity (HCC)  Hyponatremia  Altered mental status, unspecified altered mental status type     Rx / DC Orders   ED Discharge Orders     None        Note:  This document was prepared using Dragon voice recognition software and may include unintentional dictation errors.   Corena Herter, MD 10/22/23 [phone]

## 2023-10-22 NOTE — H&P (Incomplete)
History and Physical    Anne Phillips:[id number] DOB: Apr 16, 1951 DOA: 10/22/2023  Referring MD/NP/PA:   PCP: Gillis Santa, MD   Patient coming from:  The patient is coming from home.     Chief Complaint: seizure  HPI: Anne Phillips is a 73 y.o. female with medical history significant of seizure, hypertension, hyperlipidemia, CAD, breast cancer (mastectomy), dCHF, CKD stage IIIb, chronic hyponatremia, recently diagnosed to recent A-fib not on [facility], who presents with seizure.  Patient was recently hospitalized from 2/14 - 2/17 due to newly diagnosed A-fib and seizure.  Her breakthrough seizure was though to be secondary to medication nonadherence.        who presents with altered mental status, fall, increased urinary frequency.   --Continue Keppra and Vimpat at prior doses per neurology recommendations - Meds to beds prior to discharge       Data reviewed independently and ED Course: pt was found to have     ***       EKG: I have personally reviewed.  Not done in ED, will get one.   ***   Review of Systems:   General: no fevers, chills, no body weight gain, has poor appetite, has fatigue HEENT: no blurry vision, hearing changes or sore throat Respiratory: no dyspnea, coughing, wheezing CV: no chest pain, no palpitations GI: no nausea, vomiting, abdominal pain, diarrhea, constipation GU: no dysuria, burning on urination, increased urinary frequency, hematuria  Ext: no leg edema Neuro: no unilateral weakness, numbness, or tingling, no vision change or hearing loss Skin: no rash, no skin tear. MSK: No muscle spasm, no deformity, no limitation of range of movement in spin Heme: No easy bruising.  Travel history: No recent long distant travel.   Allergy:  Allergies  Allergen Reactions  . Iodinated Contrast Media Hives and Other (See Comments)    Rash  Other Reaction(s): Other (See Comments)  Rash   Rash   Rash    Rash  Rash  . Other Hives     Metal-reaction=rash Metal-reaction=rash     Past Medical History:  Diagnosis Date  . Cancer (HCC)   . Coronary artery disease   . Hyperlipidemia   . Hypertension   . Seizures (HCC)   . Slurred speech 10/11/2022  . Thyroid disease     Past Surgical History:  Procedure Laterality Date  . MASTECTOMY Left     Social History:  reports that she has been smoking cigarettes. She has never used smokeless tobacco. She reports that she does not drink alcohol and does not use drugs.  Family History:  Family History  Problem Relation Age of Onset  . Hypertension Other      Prior to Admission medications   Medication Sig Start Date End Date Taking? Authorizing Provider  acetaminophen (TYLENOL) 325 MG tablet Take 2 tablets (650 mg total) by mouth every 6 (six) hours as needed for mild pain (or Fever >/= 101). 10/05/20   Zannie Cove, MD  ammonium lactate (AMLACTIN) 12 % cream Apply 1 Application topically 2 (two) times daily.    [provider]  aspirin EC 81 MG tablet Take 1 tablet (81 mg total) by mouth daily. Swallow whole. 10/18/22   Delfino Lovett, MD  atorvastatin (LIPITOR) 40 MG tablet Take 40 mg by mouth daily.    [provider]  calcium carbonate (CALCIUM ANTACID) 500 MG chewable tablet Chew 1 tablet (200 mg of elemental calcium total) by mouth 2 (two) times daily with a meal. 10/19/23  01/02/24  Lucile Shutters, MD  carvedilol (COREG) 6.25 MG tablet Take 1 tablet (6.25 mg total) by mouth 2 (two) times daily. 10/19/23 11/18/23  Lucile Shutters, MD  cyanocobalamin 1000 MCG tablet Take 1 tablet (1,000 mcg total) by mouth daily. 10/19/23 11/18/23  Lucile Shutters, MD  empagliflozin (JARDIANCE) 10 MG TABS tablet Take 1 tablet (10 mg total) by mouth daily. 10/20/23 11/19/23  Lucile Shutters, MD  feeding supplement (ENSURE ENLIVE / ENSURE PLUS) LIQD Take 237 mLs by mouth 2 (two) times daily between meals. 10/17/22   Delfino Lovett, MD  Lacosamide 150 MG TABS Take 1 tablet (150 mg  total) by mouth 2 (two) times daily. 10/19/23 11/19/23  Lucile Shutters, MD  levETIRAcetam (KEPPRA) 500 MG tablet Take 1 tablet (500 mg total) by mouth 2 (two) times daily. 10/19/23 11/18/23  Lucile Shutters, MD  losartan (COZAAR) 100 MG tablet Take 100 mg by mouth daily.    [provider]  methimazole (TAPAZOLE) 5 MG tablet Take 1 tablet (5 mg total) by mouth daily. 10/19/23 11/18/23  Lucile Shutters, MD  Multiple Vitamin (MULTIVITAMIN WITH MINERALS) TABS tablet Take 1 tablet by mouth daily. 10/19/23 11/18/23  Agbata, Elwyn Lade, MD  nicotine (NICODERM CQ - DOSED IN MG/24 HOURS) 14 mg/24hr patch Place 1 patch (14 mg total) onto the skin daily. 10/18/22   Delfino Lovett, MD  nitroGLYCERIN (NITROSTAT) 0.4 MG SL tablet Place 0.4 mg under the tongue every 5 (five) minutes as needed for chest pain.    [provider]  pantoprazole (PROTONIX) 40 MG tablet Take 1 tablet (40 mg total) by mouth daily. 10/19/23 11/18/23  Lucile Shutters, MD    Physical Exam: Vitals:   10/22/23 2030 10/22/23 2031  BP: (!) 113/100   Pulse: 79   Resp: 16   Temp: 97.6 F (36.4 C)   TempSrc: Oral   SpO2: 97%   Weight:  54.4 kg  Height:  5' (1.524 m)   General: Not in acute distress HEENT:       Eyes: PERRL, EOMI, no jaundice       ENT: No discharge from the ears and nose, no pharynx injection, no tonsillar enlargement.        Neck: No JVD, no bruit, no mass felt. Heme: No neck lymph node enlargement. Cardiac: S1/S2, RRR, No murmurs, No gallops or rubs. Respiratory: No rales, wheezing, rhonchi or rubs. GI: Soft, nondistended, nontender, no rebound pain, no organomegaly, BS present. GU: No hematuria Ext: No pitting leg edema bilaterally. 1+DP/PT pulse bilaterally. Musculoskeletal: No joint deformities, No joint redness or warmth, no limitation of ROM in spin. Skin: No rashes.  Neuro: Alert, oriented X3, cranial nerves II-XII grossly intact, moves all extremities normally. Muscle strength 5/5 in all  extremities, sensation to light touch intact. Brachial reflex 2+ bilaterally. Knee reflex 1+ bilaterally. Negative Babinski's sign. Normal finger to nose test. Psych: Patient is not psychotic, no suicidal or hemocidal ideation.  Labs on Admission: I have personally reviewed following labs and imaging studies  CBC: Recent Labs  Lab 10/16/23 0712 10/19/23 0705 10/20/23 0617 10/22/23 2038  WBC 7.5 9.2 8.5 8.8  NEUTROABS 6.3 7.9* 7.0  --   HGB 11.0* 9.8* 10.3* 11.0*  HCT 33.2* 29.1* 30.5* 33.0*  MCV 87.8 86.6 86.4 88.0  PLT 184 147* 149* 195   Basic Metabolic Panel: Recent Labs  Lab 10/16/23 0712 10/16/23 2224 10/17/23 0650 10/18/23 0537 10/19/23 0705 10/20/23 0617 10/22/23 2038  NA 133*  --  133* 128* 128* 128* 126*  K 2.3*   < > 3.2* 4.1 4.0 4.2 3.7  CL 89*  --  92* 93* 94* 94* 89*  CO2 30  --  29 26 27 24 23   GLUCOSE 101*  --  155* 103* 89 83 102*  BUN 14  --  18 33* 32* 36* 33*  CREATININE 1.27*  --  1.12* 1.27* 1.27* 1.17* 1.39*  CALCIUM 7.5*  --  7.9* 8.2* 7.8* 8.1* 8.7*  MG 1.0*  --  3.0*  --  1.9 1.7  --   PHOS  --   --  3.4  --  2.0* 2.7  --    < > = values in this interval not displayed.   GFR: Estimated Creatinine Clearance: 26.7 mL/min (A) (by C-G formula based on SCr of 1.39 mg/dL (H)). Liver Function Tests: Recent Labs  Lab 10/16/23 0712 10/19/23 0705 10/20/23 0617 10/22/23 2038  AST 26 23 17  41  ALT 13 19 18  48*  ALKPHOS 62 49 50 63  BILITOT 1.0 0.8 0.6 0.5  PROT 7.4 5.9* 6.1* 7.0  ALBUMIN 3.7 2.9* 2.9* 3.5   No results for input(s): "LIPASE", "AMYLASE" in the last 168 hours. No results for input(s): "AMMONIA" in the last 168 hours. Coagulation Profile: No results for input(s): "INR", "PROTIME" in the last 168 hours. Cardiac Enzymes: No results for input(s): "CKTOTAL", "CKMB", "CKMBINDEX", "TROPONINI" in the last 168 hours. BNP (last 3 results) No results for input(s): "PROBNP" in the last 8760 hours. HbA1C: No results for input(s):  "HGBA1C" in the last 72 hours. CBG: Recent Labs  Lab 10/16/23 0709  GLUCAP 115*   Lipid Profile: No results for input(s): "CHOL", "HDL", "LDLCALC", "TRIG", "CHOLHDL", "LDLDIRECT" in the last 72 hours. Thyroid Function Tests: No results for input(s): "TSH", "T4TOTAL", "FREET4", "T3FREE", "THYROIDAB" in the last 72 hours. Anemia Panel: No results for input(s): "VITAMINB12", "FOLATE", "FERRITIN", "TIBC", "IRON", "RETICCTPCT" in the last 72 hours. Urine analysis:    Component Value Date/Time   COLORURINE AMBER (A) 01/05/2023 1816   APPEARANCEUR HAZY (A) 01/05/2023 1816   LABSPEC 1.009 01/05/2023 1816   PHURINE 5.0 01/05/2023 1816   GLUCOSEU NEGATIVE 01/05/2023 1816   HGBUR SMALL (A) 01/05/2023 1816   BILIRUBINUR NEGATIVE 01/05/2023 1816   KETONESUR NEGATIVE 01/05/2023 1816   PROTEINUR NEGATIVE 01/05/2023 1816   NITRITE POSITIVE (A) 01/05/2023 1816   LEUKOCYTESUR LARGE (A) 01/05/2023 1816   Sepsis Labs: @LABRCNTIP (procalcitonin:4,lacticidven:4) )No results found for this or any previous visit (from the past 240 hours).   Radiological Exams on Admission:   Assessment/Plan Active Problems:   * No active hospital problems. *   Assessment and Plan: No notes have been filed under this hospital service. Service: Hospitalist      Active Problems:   * No active hospital problems. *    DVT ppx: SQ Heparin         SQ Lovenox  Code Status: Full code   ***  Family Communication:     not done, no family member is at bed side.              Yes, patient's    at bed side.       by phone   ***  Disposition Plan:  Anticipate discharge back to previous environment  Consults called:    Admission status and Level of care: :    for obs as inpt        Dispo: The patient is from: {From:23814}  Anticipated d/c is to: {To:23815}              Anticipated d/c date is: {Days:23816}              Patient currently {Medically stable:23817}    Severity of  Illness:  {Observation/Inpatient:21159}       Date of Service 10/22/2023    Lorretta Harp [facility]   If 7PM-7AM, please contact night-coverage [email or web address] 10/22/2023, 11:35 PM

## 2023-10-22 NOTE — ED Notes (Signed)
Pt assisted to the restroom again. Pt reconnected and no further needs at this time.

## 2023-10-22 NOTE — ED Notes (Signed)
 Pt to CT

## 2023-10-22 NOTE — H&P (Incomplete)
History and Physical    Anne Phillips ZOX:[id number] DOB: 03-Jan-1951 DOA: 10/22/2023  Referring MD/NP/PA:   PCP: Gillis Santa, MD   Patient coming from:  The patient is coming from home.     Chief Complaint: seizure  HPI: Anne Phillips is a 73 y.o. female with medical history significant of seizure, hypertension, hyperlipidemia, CAD, breast cancer (mastectomy), dCHF, stroke, CKD stage IIIb, chronic hyponatremia, recently diagnosed to recent A-fib not on [facility], hyperthyroidism, who presents with seizure.  Patient was recently hospitalized from 2/14 - 2/17 due to newly diagnosed A-fib and seizure.  Her breakthrough seizure was though to be secondary to medication nonadherence. Per EMS report, her brother called EMS after she had witnessed seizure that lasted for approximately 1 minute. When EMS arrived she was not postictal and at her normal mental status baseline.  Patient is not very clear about what seizure medications she is taking, but states that one of them is Keppra.  She is supposed to take Keppra 500 mg twice daily and Vimpat 150 mg bid.  Patient denies chest pain, cough, SOB.  No nausea, vomiting, diarrhea or abdominal pain.  No symptoms of UTI.  Data reviewed independently and ED Course: pt was found to have WBC 8.8, slightly worsening renal function, sodium 126, troponin 22 --> 19, temperature normal, blood pressure 113/100, heart rate 79, RR 16, oxygen saturation 97% on room air.  CT of head is negative for acute intracranial abnormalities, but showed old right occipital infarct.  Patient is placed in PCU for observation.  EKG: I have personally reviewed.  Sinus rhythm, QTc 433, low voltage, T wave inversion in V4-V 5.   Review of Systems:   General: no fevers, chills, no body weight gain, has fatigue HEENT: no blurry vision, hearing changes or sore throat Respiratory: no dyspnea, coughing, wheezing CV: no chest pain, no palpitations GI: no nausea, vomiting, abdominal  pain, diarrhea, constipation GU: no dysuria, burning on urination, increased urinary frequency, hematuria  Ext: no leg edema Neuro: no vision change or hearing loss. Has seizure Skin: no rash, no skin tear. MSK: No muscle spasm, no deformity, no limitation of range of movement in spin Heme: No easy bruising.  Travel history: No recent long distant travel.   Allergy:  Allergies  Allergen Reactions   Iodinated Contrast Media Hives and Other (See Comments)    Rash  Other Reaction(s): Other (See Comments)  Rash   Rash   Rash    Rash  Rash   Other Hives    Metal-reaction=rash Metal-reaction=rash     Past Medical History:  Diagnosis Date   Cancer (HCC)    Coronary artery disease    Hyperlipidemia    Hypertension    Seizures (HCC)    Slurred speech 10/11/2022   Thyroid disease     Past Surgical History:  Procedure Laterality Date   MASTECTOMY Left     Social History:  reports that she has been smoking cigarettes. She has never used smokeless tobacco. She reports that she does not drink alcohol and does not use drugs.  Family History:  Family History  Problem Relation Age of Onset   Hypertension Other      Prior to Admission medications   Medication Sig Start Date End Date Taking? Authorizing Provider  acetaminophen (TYLENOL) 325 MG tablet Take 2 tablets (650 mg total) by mouth every 6 (six) hours as needed for mild pain (or Fever >/= 101). 10/05/20   Zannie Cove, MD  ammonium lactate (  AMLACTIN) 12 % cream Apply 1 Application topically 2 (two) times daily.    [provider]  aspirin EC 81 MG tablet Take 1 tablet (81 mg total) by mouth daily. Swallow whole. 10/18/22   Delfino Lovett, MD  atorvastatin (LIPITOR) 40 MG tablet Take 40 mg by mouth daily.    [provider]  calcium carbonate (CALCIUM ANTACID) 500 MG chewable tablet Chew 1 tablet (200 mg of elemental calcium total) by mouth 2 (two) times daily with a meal. 10/19/23 01/02/24  Agbata,  Tochukwu, MD  carvedilol (COREG) 6.25 MG tablet Take 1 tablet (6.25 mg total) by mouth 2 (two) times daily. 10/19/23 11/18/23  Lucile Shutters, MD  cyanocobalamin 1000 MCG tablet Take 1 tablet (1,000 mcg total) by mouth daily. 10/19/23 11/18/23  Lucile Shutters, MD  empagliflozin (JARDIANCE) 10 MG TABS tablet Take 1 tablet (10 mg total) by mouth daily. 10/20/23 11/19/23  Lucile Shutters, MD  feeding supplement (ENSURE ENLIVE / ENSURE PLUS) LIQD Take 237 mLs by mouth 2 (two) times daily between meals. 10/17/22   Delfino Lovett, MD  Lacosamide 150 MG TABS Take 1 tablet (150 mg total) by mouth 2 (two) times daily. 10/19/23 11/19/23  Lucile Shutters, MD  levETIRAcetam (KEPPRA) 500 MG tablet Take 1 tablet (500 mg total) by mouth 2 (two) times daily. 10/19/23 11/18/23  Lucile Shutters, MD  losartan (COZAAR) 100 MG tablet Take 100 mg by mouth daily.    [provider]  methimazole (TAPAZOLE) 5 MG tablet Take 1 tablet (5 mg total) by mouth daily. 10/19/23 11/18/23  Lucile Shutters, MD  Multiple Vitamin (MULTIVITAMIN WITH MINERALS) TABS tablet Take 1 tablet by mouth daily. 10/19/23 11/18/23  Agbata, Elwyn Lade, MD  nicotine (NICODERM CQ - DOSED IN MG/24 HOURS) 14 mg/24hr patch Place 1 patch (14 mg total) onto the skin daily. 10/18/22   Delfino Lovett, MD  nitroGLYCERIN (NITROSTAT) 0.4 MG SL tablet Place 0.4 mg under the tongue every 5 (five) minutes as needed for chest pain.    [provider]  pantoprazole (PROTONIX) 40 MG tablet Take 1 tablet (40 mg total) by mouth daily. 10/19/23 11/18/23  Lucile Shutters, MD    Physical Exam: Vitals:   10/22/23 2030 10/22/23 2031  BP: (!) 113/100   Pulse: 79   Resp: 16   Temp: 97.6 F (36.4 C)   TempSrc: Oral   SpO2: 97%   Weight:  54.4 kg  Height:  5' (1.524 m)   General: Not in acute distress HEENT:       Eyes: PERRL, EOMI, no jaundice       ENT: No discharge from the ears and nose, no pharynx injection, no tonsillar enlargement.        Neck: No JVD, no  bruit, no mass felt. Heme: No neck lymph node enlargement. Cardiac: S1/S2, RRR, No murmurs, No gallops or rubs. Respiratory: No rales, wheezing, rhonchi or rubs. GI: Soft, nondistended, nontender, no rebound pain, no organomegaly, BS present. GU: No hematuria Ext: No pitting leg edema bilaterally. 1+DP/PT pulse bilaterally. Musculoskeletal: No joint deformities, No joint redness or warmth, no limitation of ROM in spin. Skin: No rashes.  Neuro: Alert, oriented X3, cranial nerves II-XII grossly intact, moves all extremities. Psych: Patient is not psychotic, no suicidal or hemocidal ideation.  Labs on Admission: I have personally reviewed following labs and imaging studies  CBC: Recent Labs  Lab 10/16/23 0712 10/19/23 0705 10/20/23 0617 10/22/23 2038  WBC 7.5 9.2 8.5 8.8  NEUTROABS 6.3 7.9* 7.0  --  HGB 11.0* 9.8* 10.3* 11.0*  HCT 33.2* 29.1* 30.5* 33.0*  MCV 87.8 86.6 86.4 88.0  PLT 184 147* 149* 195   Basic Metabolic Panel: Recent Labs  Lab 10/16/23 0712 10/16/23 2224 10/17/23 0650 10/18/23 0537 10/19/23 0705 10/20/23 0617 10/22/23 2038  NA 133*  --  133* 128* 128* 128* 126*  K 2.3*   < > 3.2* 4.1 4.0 4.2 3.7  CL 89*  --  92* 93* 94* 94* 89*  CO2 30  --  29 26 27 24 23   GLUCOSE 101*  --  155* 103* 89 83 102*  BUN 14  --  18 33* 32* 36* 33*  CREATININE 1.27*  --  1.12* 1.27* 1.27* 1.17* 1.39*  CALCIUM 7.5*  --  7.9* 8.2* 7.8* 8.1* 8.7*  MG 1.0*  --  3.0*  --  1.9 1.7  --   PHOS  --   --  3.4  --  2.0* 2.7  --    < > = values in this interval not displayed.   GFR: Estimated Creatinine Clearance: 26.7 mL/min (A) (by C-G formula based on SCr of 1.39 mg/dL (H)). Liver Function Tests: Recent Labs  Lab 10/16/23 0712 10/19/23 0705 10/20/23 0617 10/22/23 2038  AST 26 23 17  41  ALT 13 19 18  48*  ALKPHOS 62 49 50 63  BILITOT 1.0 0.8 0.6 0.5  PROT 7.4 5.9* 6.1* 7.0  ALBUMIN 3.7 2.9* 2.9* 3.5   No results for input(s): "LIPASE", "AMYLASE" in the last 168  hours. No results for input(s): "AMMONIA" in the last 168 hours. Coagulation Profile: No results for input(s): "INR", "PROTIME" in the last 168 hours. Cardiac Enzymes: No results for input(s): "CKTOTAL", "CKMB", "CKMBINDEX", "TROPONINI" in the last 168 hours. BNP (last 3 results) No results for input(s): "PROBNP" in the last 8760 hours. HbA1C: No results for input(s): "HGBA1C" in the last 72 hours. CBG: Recent Labs  Lab 10/16/23 0709  GLUCAP 115*   Lipid Profile: No results for input(s): "CHOL", "HDL", "LDLCALC", "TRIG", "CHOLHDL", "LDLDIRECT" in the last 72 hours. Thyroid Function Tests: No results for input(s): "TSH", "T4TOTAL", "FREET4", "T3FREE", "THYROIDAB" in the last 72 hours. Anemia Panel: No results for input(s): "VITAMINB12", "FOLATE", "FERRITIN", "TIBC", "IRON", "RETICCTPCT" in the last 72 hours. Urine analysis:    Component Value Date/Time   COLORURINE AMBER (A) 01/05/2023 1816   APPEARANCEUR HAZY (A) 01/05/2023 1816   LABSPEC 1.009 01/05/2023 1816   PHURINE 5.0 01/05/2023 1816   GLUCOSEU NEGATIVE 01/05/2023 1816   HGBUR SMALL (A) 01/05/2023 1816   BILIRUBINUR NEGATIVE 01/05/2023 1816   KETONESUR NEGATIVE 01/05/2023 1816   PROTEINUR NEGATIVE 01/05/2023 1816   NITRITE POSITIVE (A) 01/05/2023 1816   LEUKOCYTESUR LARGE (A) 01/05/2023 1816   Sepsis Labs: @LABRCNTIP (procalcitonin:4,lacticidven:4) )No results found for this or any previous visit (from the past 240 hours).   Radiological Exams on Admission:   Assessment/Plan Principal Problem:   Seizure [facility]) Active Problems:   Hyponatremia   Stage 3b chronic kidney disease (HCC)   Chronic diastolic CHF (congestive heart failure) (HCC)   Atrial fibrillation (HCC)   Coronary artery disease   Myocardial injury   Hyperthyroidism   Hypertension   Dyslipidemia   Stroke [facility])   Assessment and Plan:  Seizure [facility]): Patient has beakthrough seizure. Pt is not very clear what his seizure medications she is  taking currently, it is unlikely that she did not take her Keppra and Vimpat correctly.  CT of head is negative acute intracranial abnormalities.  -Place  in PCU for observation -Patient was loaded with 1 g of Keppra in ED -Continue home Keppra 500 mg twice daily -Continue home Vimpat 150 mg twice daily -Check Keppra level and Vimpat level -Seizure precaution -As needed Ativan for seizure  Hyponatremia: This is chronic issue.  Recent sodium level was 128 on 10/20/2023. - Will check urine sodium, urine osmolality, serum osmolality. - Fluid restriction - IVF: 1L NS in ED, then 75 cc/h - Sodium chloride tablet 1 g twice daily - f/u by BMP q8h - avoid over correction too fast due to risk of central pontine myelinolysis  Stage 3b chronic kidney disease (HCC): Renal function is slightly worsening than recent baseline.  Baseline creatinine 1.17 on 10/20/2023, her creatinine is 1.39, BUN 33, GFR 41. -Hold her Lasix, spironolactone, Cozaar -IV fluid as above  Chronic diastolic CHF (congestive heart failure) (HCC): 2D echo on 10/16/2023 showed EF of 55 to 60% with grade 2 diastolic dysfunction.  Patient does not have leg edema JVD.  No SOB.  CHF is compensated. -Hold Lasix, spironolactone due to worsening renal function  Atrial fibrillation [facility]): Heart rate 79.  Patient is not taking anticoagulants -Coreg  Coronary artery disease and myocardial injury: Troponin is minimally elevated, 22 --> 19.  No chest pain -Continue aspirin and Lipitor  Hyperthyroidism -Methimazole  Hypertension -IV hydralazine as needed -Coreg -Hold Cozaar due to worsening renal function  Dyslipidemia: -Lipitor  History of stroke -Aspirin, Lipitor      DVT ppx: SQ Lovenox  Code Status: Full code   Family Communication:     not done, no family member is at bed side.   Disposition Plan:  Anticipate discharge back to previous environment  Consults called:  none  Admission status and Level of care:  Progressive:    for obs     Dispo: The patient is from: Home              Anticipated d/c is to: Home              Anticipated d/c date is: 1 day              Patient currently is not medically stable to d/c.    Severity of Illness:  The appropriate patient status for this patient is OBSERVATION. Observation status is judged to be reasonable and necessary in order to provide the required intensity of service to ensure the patient's safety. The patient's presenting symptoms, physical exam findings, and initial radiographic and laboratory data in the context of their medical condition is felt to place them at decreased risk for further clinical deterioration. Furthermore, it is anticipated that the patient will be medically stable for discharge from the hospital within 2 midnights of admission.        Date of Service 10/23/2023    Lorretta Harp [facility]   If 7PM-7AM, please contact night-coverage [email or web address] 10/23/2023, 12:18 AM

## 2023-10-22 NOTE — ED Notes (Signed)
Pt given snack at this time  

## 2023-10-22 NOTE — ED Triage Notes (Signed)
Pt BIB [facility] for seizure, lasted 1 minute. Was not post ictal upon EMS arrival. Aox4. CBG 139. Seizure was witnessed and pt did not fall according to pts brother. Hx of stroke. Ambulatory with assistance.

## 2023-10-23 ENCOUNTER — Other Ambulatory Visit: Payer: Self-pay

## 2023-10-23 ENCOUNTER — Encounter: Payer: Self-pay | Admitting: Internal Medicine

## 2023-10-23 DIAGNOSIS — E871 Hypo-osmolality and hyponatremia: Secondary | ICD-10-CM | POA: Diagnosis not present

## 2023-10-23 DIAGNOSIS — N1832 Chronic kidney disease, stage 3b: Secondary | ICD-10-CM | POA: Diagnosis not present

## 2023-10-23 DIAGNOSIS — R569 Unspecified convulsions: Secondary | ICD-10-CM

## 2023-10-23 DIAGNOSIS — I639 Cerebral infarction, unspecified: Secondary | ICD-10-CM | POA: Diagnosis present

## 2023-10-23 DIAGNOSIS — I10 Essential (primary) hypertension: Secondary | ICD-10-CM | POA: Diagnosis not present

## 2023-10-23 LAB — BASIC METABOLIC PANEL
Anion gap: 9 (ref 5–15)
Anion gap: 9 (ref 5–15)
Anion gap: 9 (ref 5–15)
BUN: 38 mg/dL — ABNORMAL HIGH (ref 8–23)
BUN: 30 mg/dL — ABNORMAL HIGH (ref 8–23)
BUN: 30 mg/dL — ABNORMAL HIGH (ref 8–23)
CO2: 23 mmol/L (ref 22–32)
CO2: 25 mmol/L (ref 22–32)
CO2: 24 mmol/L (ref 22–32)
Calcium: 7.3 mg/dL — ABNORMAL LOW (ref 8.9–10.3)
Calcium: 7.8 mg/dL — ABNORMAL LOW (ref 8.9–10.3)
Calcium: 8 mg/dL — ABNORMAL LOW (ref 8.9–10.3)
Chloride: 96 mmol/L — ABNORMAL LOW (ref 98–111)
Chloride: 94 mmol/L — ABNORMAL LOW (ref 98–111)
Chloride: 96 mmol/L — ABNORMAL LOW (ref 98–111)
Creatinine, Ser: 1.34 mg/dL — ABNORMAL HIGH (ref 0.44–1.00)
Creatinine, Ser: 1.35 mg/dL — ABNORMAL HIGH (ref 0.44–1.00)
Creatinine, Ser: 1.34 mg/dL — ABNORMAL HIGH (ref 0.44–1.00)
GFR, Estimated: 42 mL/min — ABNORMAL LOW (ref >60)
GFR, Estimated: 42 mL/min — ABNORMAL LOW (ref >60)
GFR, Estimated: 42 mL/min — ABNORMAL LOW (ref >60)
Glucose, Bld: 107 mg/dL — ABNORMAL HIGH (ref 70–99)
Glucose, Bld: 91 mg/dL (ref 70–99)
Glucose, Bld: 98 mg/dL (ref 70–99)
Potassium: 3.2 mmol/L — ABNORMAL LOW (ref 3.5–5.1)
Potassium: 3.9 mmol/L (ref 3.5–5.1)
Potassium: 4.2 mmol/L (ref 3.5–5.1)
Sodium: 128 mmol/L — ABNORMAL LOW (ref 135–145)
Sodium: 128 mmol/L — ABNORMAL LOW (ref 135–145)
Sodium: 129 mmol/L — ABNORMAL LOW (ref 135–145)

## 2023-10-23 LAB — OSMOLALITY, URINE: Osmolality, Ur: 278 mosm/kg — ABNORMAL LOW (ref 300–900)

## 2023-10-23 LAB — OSMOLALITY: Osmolality: 276 mosm/kg (ref 275–295)

## 2023-10-23 LAB — SODIUM, URINE, RANDOM: Sodium, Ur: 17 mmol/L

## 2023-10-23 MED ORDER — ASPIRIN 81 MG PO TBEC
81.0000 mg | DELAYED_RELEASE_TABLET | Freq: Every day | ORAL | Status: DC
  Administered 2023-10-23: 81 mg via ORAL
  Filled 2023-10-23: qty 1

## 2023-10-23 MED ORDER — METHIMAZOLE 5 MG PO TABS
5.0000 mg | ORAL_TABLET | Freq: Every day | ORAL | Status: DC
  Administered 2023-10-23: 5 mg via ORAL
  Filled 2023-10-23: qty 1

## 2023-10-23 MED ORDER — CYANOCOBALAMIN 1000 MCG PO TABS: 1000.0000 ug | ORAL_TABLET | Freq: Every day | ORAL | 0 refills | Status: AC

## 2023-10-23 MED ORDER — NICOTINE 14 MG/24HR TD PT24
14.0000 mg | MEDICATED_PATCH | Freq: Every day | TRANSDERMAL | 0 refills | Status: DC
  Filled 2023-10-23: qty 28, 28d supply, fill #0

## 2023-10-23 MED ORDER — ATORVASTATIN CALCIUM 40 MG PO TABS
40.0000 mg | ORAL_TABLET | Freq: Every day | ORAL | 0 refills | Status: DC
  Filled 2023-10-23: qty 30, 30d supply, fill #0

## 2023-10-23 MED ORDER — ATORVASTATIN CALCIUM 20 MG PO TABS
40.0000 mg | ORAL_TABLET | Freq: Every day | ORAL | Status: DC
  Administered 2023-10-23: 40 mg via ORAL
  Filled 2023-10-23: qty 2

## 2023-10-23 MED ORDER — NITROGLYCERIN 0.4 MG SL SUBL: 0.4000 mg | SUBLINGUAL_TABLET | SUBLINGUAL | Status: DC | PRN

## 2023-10-23 MED ORDER — LACOSAMIDE 150 MG PO TABS
150.0000 mg | ORAL_TABLET | Freq: Two times a day (BID) | ORAL | 0 refills | Status: AC
  Filled 2023-10-23: qty 60, 30d supply, fill #0

## 2023-10-23 MED ORDER — METHOCARBAMOL 500 MG PO TABS
500.0000 mg | ORAL_TABLET | Freq: Once | ORAL | Status: AC
  Administered 2023-10-23: 500 mg via ORAL
  Filled 2023-10-23: qty 1

## 2023-10-23 MED ORDER — SODIUM CHLORIDE 0.9 % IV SOLN: INTRAVENOUS | Status: DC

## 2023-10-23 MED ORDER — ADULT MULTIVITAMIN W/MINERALS CH: 1.0000 | ORAL_TABLET | Freq: Every day | ORAL | 0 refills | Status: AC

## 2023-10-23 MED ORDER — EMPAGLIFLOZIN 10 MG PO TABS
10.0000 mg | ORAL_TABLET | Freq: Every day | ORAL | Status: DC
  Administered 2023-10-23: 10 mg via ORAL
  Filled 2023-10-23: qty 1

## 2023-10-23 MED ORDER — CYANOCOBALAMIN 500 MCG PO TABS
1000.0000 ug | ORAL_TABLET | Freq: Every day | ORAL | Status: DC
  Administered 2023-10-23: 1000 ug via ORAL
  Filled 2023-10-23: qty 2

## 2023-10-23 MED ORDER — LEVETIRACETAM 500 MG PO TABS
500.0000 mg | ORAL_TABLET | Freq: Two times a day (BID) | ORAL | Status: DC
  Administered 2023-10-23: 500 mg via ORAL
  Filled 2023-10-23: qty 1

## 2023-10-23 MED ORDER — LOSARTAN POTASSIUM 50 MG PO TABS
50.0000 mg | ORAL_TABLET | Freq: Every day | ORAL | 0 refills | Status: DC
  Filled 2023-10-23: qty 30, 30d supply, fill #0

## 2023-10-23 MED ORDER — METHIMAZOLE 5 MG PO TABS
5.0000 mg | ORAL_TABLET | Freq: Every day | ORAL | 0 refills | Status: AC
  Filled 2023-10-23: qty 30, 30d supply, fill #0

## 2023-10-23 MED ORDER — CARVEDILOL 6.25 MG PO TABS
6.2500 mg | ORAL_TABLET | Freq: Two times a day (BID) | ORAL | Status: DC
  Administered 2023-10-23: 6.25 mg via ORAL
  Filled 2023-10-23: qty 1

## 2023-10-23 MED ORDER — CALCIUM CARBONATE ANTACID 500 MG PO CHEW
1.0000 | CHEWABLE_TABLET | Freq: Two times a day (BID) | ORAL | Status: DC
  Administered 2023-10-23: 200 mg via ORAL
  Filled 2023-10-23: qty 1

## 2023-10-23 MED ORDER — CARVEDILOL 6.25 MG PO TABS
6.2500 mg | ORAL_TABLET | Freq: Two times a day (BID) | ORAL | 0 refills | Status: AC
  Filled 2023-10-23: qty 60, 30d supply, fill #0

## 2023-10-23 MED ORDER — PANTOPRAZOLE SODIUM 40 MG PO TBEC
40.0000 mg | DELAYED_RELEASE_TABLET | Freq: Every day | ORAL | 0 refills | Status: AC
  Filled 2023-10-23: qty 30, 30d supply, fill #0

## 2023-10-23 MED ORDER — CALCIUM CARBONATE ANTACID 500 MG PO CHEW
1.0000 | CHEWABLE_TABLET | Freq: Two times a day (BID) | ORAL | 0 refills | Status: AC
Start: 2023-10-23 — End: 2023-11-22
  Filled 2023-10-23: qty 60, 30d supply, fill #0

## 2023-10-23 MED ORDER — ADULT MULTIVITAMIN W/MINERALS CH
1.0000 | ORAL_TABLET | Freq: Every day | ORAL | Status: DC
  Administered 2023-10-23: 1 via ORAL
  Filled 2023-10-23: qty 1

## 2023-10-23 MED ORDER — PANTOPRAZOLE SODIUM 40 MG PO TBEC
40.0000 mg | DELAYED_RELEASE_TABLET | Freq: Every day | ORAL | Status: DC
  Administered 2023-10-23: 40 mg via ORAL
  Filled 2023-10-23: qty 1

## 2023-10-23 MED ORDER — EMPAGLIFLOZIN 10 MG PO TABS
10.0000 mg | ORAL_TABLET | Freq: Every day | ORAL | 0 refills | Status: AC
  Filled 2023-10-23: qty 30, 30d supply, fill #0

## 2023-10-23 MED ORDER — LEVETIRACETAM 500 MG PO TABS
500.0000 mg | ORAL_TABLET | Freq: Two times a day (BID) | ORAL | 0 refills | Status: AC
Start: 2023-10-23 — End: 2023-11-22
  Filled 2023-10-23: qty 60, 30d supply, fill #0

## 2023-10-23 MED ORDER — CARVEDILOL 6.25 MG TABLET
ORAL_TABLET | Freq: Two times a day (BID) | ORAL | 11 refills | 30.00 days
Start: 2023-10-23 — End: 2024-10-22

## 2023-10-23 NOTE — ED Notes (Signed)
Pt escorted to the restroom again. Pt offered PureWick for easier time with toilet needs. Pt refused.

## 2023-10-23 NOTE — Discharge Summary (Signed)
Physician Discharge Summary   Patient: Anne Phillips MRN: [id number] DOB: [birth year]  Admit date:     10/22/2023  Discharge date: 10/23/23  Discharge Physician: Marcelino Duster   PCP: Gillis Santa, MD   Recommendations at discharge:    PCP follow up in 1 week. Neurology follow up as scheduled.  Discharge Diagnoses: Principal Problem:   Seizure (HCC) Active Problems:   Hyponatremia   Stage 3b chronic kidney disease (HCC)   Chronic diastolic CHF (congestive heart failure) (HCC)   Atrial fibrillation (HCC)   Coronary artery disease   Myocardial injury   Hyperthyroidism   Hypertension   Dyslipidemia   Stroke (HCC)   Seizures (HCC)  Resolved Problems:   * No resolved hospital problems. *  Hospital Course: Anne Phillips is a 73 y.o. female with medical history significant of seizure, hypertension, hyperlipidemia, CAD, breast cancer (mastectomy), dCHF, stroke, CKD stage IIIb, chronic hyponatremia, recently diagnosed to recent A-fib not on [facility], hyperthyroidism presents with seizure.  She seems to be noncompliant with her medications. She is supposed to take Keppra 500 mg twice daily and Vimpat 150 mg bid. Unable to tell her seizure meds. She is admitted to hospitalist service for further management and evaluation.   Assessment and Plan:  Breakthrough Seizure [facility]): Patient has beakthrough seizure due to non compliance with Keppra and Vimpat. CT of head is negative acute intracranial abnormalities. I discussed with her, she has transportation issues and unable to follow PCP and neurology. TOC consulted to give resources. I sent all scripts to out pharmacy so she will be have them ready before discharge. She is able to walk with RN. Hemodynamically stable to be discharged home. Advised PCP follow up. She understands and agrees.   Hyponatremia: This is chronic issue.   Sodium remained stable. Outpatient follow up suggested.   Stage 3b chronic kidney disease [facility]):  Renal  function is slightly worsening than recent baseline.  Baseline creatinine 1.17 on 10/20/2023, her creatinine is 1.39, BUN 33, GFR 41. She got gentle IV fluids. Resumed Lasix, spironolactone, Cozaar   Chronic diastolic CHF (congestive heart failure) (HCC): 2D echo on 10/16/2023 showed EF of 55 to 60% with grade 2 diastolic dysfunction.  Patient does not have leg edema JVD.  No SOB.  CHF is compensated. Resumed Lasix, spironolactone upon discharge. Advise PCP follow up and Cardiology follow up.   Atrial fibrillation [facility]):  Heart rate stable. Patient is not taking anticoagulants Continue Coreg   Coronary artery disease and myocardial injury:  Troponin is minimally elevated, 22 --> 19.  No chest pain Continue aspirin and Lipitor   Hyperthyroidism Resumed Methimazole   Hypertension Continue Coreg Resumed Cozaar   Dyslipidemia: Continue Lipitor   History of stroke On Aspirin, Lipitor       Consultants: none Procedures performed: none  Disposition: Home Diet recommendation:  Discharge Diet Orders (From admission, onward)     Start     Ordered   10/23/23 0000  Diet - low sodium heart healthy        10/23/23 1233           Cardiac diet DISCHARGE MEDICATION: Allergies as of 10/23/2023       Reactions   Iodinated Contrast Media Hives, Other (See Comments)   Rash Other Reaction(s): Other (See Comments) Rash   Rash  Rash    Rash  Rash   Other Hives   Metal-reaction=rash Metal-reaction=rash        Medication List     TAKE  these medications    acetaminophen 325 MG tablet Commonly known as: TYLENOL Take 2 tablets (650 mg total) by mouth every 6 (six) hours as needed for mild pain (or Fever >/= 101).   ammonium lactate 12 % cream Commonly known as: AMLACTIN Apply 1 Application topically 2 (two) times daily.   aspirin EC 81 MG tablet Take 1 tablet (81 mg total) by mouth daily. Swallow whole.   atorvastatin 40 MG tablet Commonly known as: LIPITOR Take  1 tablet (40 mg total) by mouth daily.   calcium carbonate 500 MG chewable tablet Commonly known as: Calcium Antacid Chew 1 tablet (200 mg of elemental calcium total) by mouth 2 (two) times daily with a meal.   carvedilol 6.25 MG tablet Commonly known as: COREG Take 1 tablet (6.25 mg total) by mouth 2 (two) times daily.   cyanocobalamin 1000 MCG tablet Take 1 tablet (1,000 mcg total) by mouth daily.   empagliflozin 10 MG Tabs tablet Commonly known as: JARDIANCE Take 1 tablet (10 mg total) by mouth daily.   feeding supplement Liqd Take 237 mLs by mouth 2 (two) times daily between meals.   Lacosamide 150 MG Tabs Take 1 tablet (150 mg total) by mouth 2 (two) times daily.   levETIRAcetam 500 MG tablet Commonly known as: KEPPRA Take 1 tablet (500 mg total) by mouth 2 (two) times daily.   losartan 50 MG tablet Commonly known as: COZAAR Take 1 tablet (50 mg total) by mouth daily. What changed:  medication strength how much to take   methimazole 5 MG tablet Commonly known as: TAPAZOLE Take 1 tablet (5 mg total) by mouth daily.   multivitamin with minerals Tabs tablet Take 1 tablet by mouth daily.   nicotine 14 mg/24hr patch Commonly known as: NICODERM CQ - dosed in mg/24 hours Place 1 patch (14 mg total) onto the skin daily.   nitroGLYCERIN 0.4 MG SL tablet Commonly known as: NITROSTAT Place 0.4 mg under the tongue every 5 (five) minutes as needed for chest pain.   pantoprazole 40 MG tablet Commonly known as: Protonix Take 1 tablet (40 mg total) by mouth daily.        Discharge Exam: Filed Weights   10/22/23 2031  Weight: 54.4 kg   Today's Vitals   10/23/23 1230 10/23/23 1411 10/23/23 1413 10/23/23 1427  BP: 124/72 123/64    Pulse: 73 74    Resp: 18 18    Temp: 97.9 F (36.6 C) 97.9 F (36.6 C)    TempSrc: Oral Oral    SpO2: 100% 100%    Weight:      Height:      PainSc: 0-No pain  0-No pain 0-No pain   Body mass index is 23.44 kg/m.  General -  Elderly African American female, no apparent distress HEENT - PERRLA, EOMI, atraumatic head, non tender sinuses. Lung - Clear, no rales, rhonchi, wheezes. Heart - S1, S2 heard, no murmurs, rubs, trace pedal edema. Abdomen - Soft, non tender, bowel sounds good Neuro - Alert, awake and oriented x 3, non focal exam. Skin - Warm and dry. Left arm swelling chronic.  Condition at discharge: stable  The results of significant diagnostics from this hospitalization (including imaging, microbiology, ancillary and laboratory) are listed below for reference.   Imaging Studies: CT Head Wo Contrast Result Date: 10/22/2023 CLINICAL DATA:  Head trauma, minor (Age >= 65y) seizure, head injury. EXAM: CT HEAD WITHOUT CONTRAST TECHNIQUE: Contiguous axial images were obtained from the base of the  skull through the vertex without intravenous contrast. RADIATION DOSE REDUCTION: This exam was performed according to the departmental dose-optimization program which includes automated exposure control, adjustment of the mA and/or kV according to patient size and/or use of iterative reconstruction technique. COMPARISON:  10/17/2023 FINDINGS: Brain: No acute intracranial abnormality. Specifically, no hemorrhage, hydrocephalus, mass lesion, acute infarction, or significant intracranial injury. Old right occipital infarct, stable. Vascular: No hyperdense vessel or unexpected calcification. Skull: No acute calvarial abnormality. Sinuses/Orbits: No acute findings Other: None IMPRESSION: No acute intracranial abnormality. Old right occipital infarct. Electronically Signed   By: Charlett Nose M.D.   On: 10/22/2023 21:12   CT ANGIO HEAD NECK W WO CM Result Date: 10/17/2023 CLINICAL DATA:  Provided history: Stroke/TIA, determine embolic source. EXAM: CT ANGIOGRAPHY HEAD AND NECK WITH AND WITHOUT CONTRAST TECHNIQUE: Multidetector CT imaging of the head and neck was performed using the standard protocol during bolus administration of  intravenous contrast. Multiplanar CT image reconstructions and MIPs were obtained to evaluate the vascular anatomy. Carotid stenosis measurements (when applicable) are obtained utilizing NASCET criteria, using the distal internal carotid diameter as the denominator. RADIATION DOSE REDUCTION: This exam was performed according to the departmental dose-optimization program which includes automated exposure control, adjustment of the mA and/or kV according to patient size and/or use of iterative reconstruction technique. CONTRAST:  65mL OMNIPAQUE IOHEXOL 350 MG/ML SOLN COMPARISON:  Brain MRI 10/16/2023.  Head CT 10/16/2023. FINDINGS: CT HEAD FINDINGS Brain: Generalized parenchymal atrophy. Redemonstrated chronic cortical/subcortical right PCA territory infarct within the right occipital lobe. Background mild patchy and ill-defined hypoattenuation within the cerebral white matter, nonspecific but compatible with chronic small vessel disease. There is no acute intracranial hemorrhage. No acute demarcated cortical infarct. No extra-axial fluid collection. No evidence of an intracranial mass. No midline shift. Vascular: No hyperdense vessel.  Atherosclerotic calcifications. Skull: No calvarial fracture or aggressive osseous lesion. Sinuses/Orbits: No mass or acute finding within the imaged orbits. No significant paranasal sinus disease. Review of the MIP images confirms the above findings CTA NECK FINDINGS Aortic arch: Standard aortic branching. Atherosclerotic plaque within the visualized thoracic aorta and proximal major branch vessels of the neck. The innominate artery origin is incompletely included in the field of view. Within this limitation, there is no appreciable hemodynamically significant innominate or proximal subclavian artery stenosis. Right carotid system: CCA and ICA patent within the neck without hemodynamically significant stenosis (50% or greater). Mild atherosclerotic plaque about the carotid bifurcation  and within the ICA. Partially retropharyngeal course of the cervical ICA. Left carotid system: CCA and ICA patent within the neck. Atherosclerotic plaque within the proximal ICA resulting in up to 40% stenosis. Atherosclerotic plaque within the mid cervical ICA resulting in up to 50% stenosis. Atherosclerotic plaque within the distal cervical ICA resulting in 30% stenosis. Vertebral arteries: Patent within the neck. The left vertebral artery is dominant. Atherosclerotic plaque within the left vertebral artery at the V1/V2 junction resulting in mild stenosis. Skeleton: Poor dentition. Cervical spondylosis. Nonacute fracture deformity of the posterolateral right third rib. Chronic fracture deformity of the distal right clavicle. Other neck: Enlarged multinodular thyroid gland. The largest nodule is located within the right lobe inferiorly, measuring 4 mm and slightly extending into the upper mediastinum. The thyroid was previously assessed by ultrasound on 10/31/2022. Please refer to this prior examination for further description and for recommendations. Upper chest: No consolidation within the imaged lung apices. Emphysema. Review of the MIP images confirms the above findings CTA HEAD FINDINGS Anterior circulation: The intracranial  internal carotid arteries are patent. Atherosclerotic plaque within both vessels. Most notably, most notably, there is moderate/severe stenosis of the cavernous/paraclinoid internal carotid arteries, bilaterally. The M1 middle cerebral arteries are patent. Severe stenosis within a proximal M2 right MCA vessel (series 13, image 22). The anterior cerebral arteries are patent. No intracranial aneurysm is identified. Posterior circulation: The intracranial vertebral arteries are patent. Atherosclerotic plaque within the left vertebral artery V4 segment with no more than mild stenosis. The basilar artery is patent. As carotid plaque within the proximal basilar artery with up to moderate  stenosis. Posterior cerebral arteries are patent proximally. Attenuated appearance of distal right PCA branches. Posterior communicating arteries are diminutive or absent, bilaterally. Venous sinuses: Within the limitations of contrast timing, no convincing thrombus. Anatomic variants: As described. Review of the MIP images confirms the above findings IMPRESSION: Non-contrast head CT: 1.  No evidence of an acute intracranial abnormality. 2. Known chronic right PCA territory infarct. 3. Background parenchymal atrophy and mild cerebral white matter chronic small vessel ischemic disease. CTA neck: 1. The common carotid and internal carotid arteries are patent within the neck. Atherosclerotic plaque bilaterally as described within body of the report, and most notably as follows. Up to 40% stenosis of the proximal cervical left ICA. Up to 50% stenosis of the mid cervical left ICA. 30% stenosis of the distal cervical left ICA. 2. The vertebral arteries are patent within the neck. Atherosclerotic plaque within the left vertebral artery at the V1/V2 junction with mild stenosis. 3. Aortic Atherosclerosis (ICD10-I70.0) and Emphysema (ICD10-J43.9). 4. Nonacute fracture deformity of the posterolateral right third rib. 5. Chronic fracture deformity of the distal right clavicle. 6. Cervical spondylosis. CTA head: 1. No proximal intracranial large vessel occlusion identified. 2. Intracranial atherosclerotic disease with multifocal stenoses, most notably as follows. 3. Moderate/severe stenoses of the cavernous/paraclinoid internal carotid arteries, bilaterally. 4. Severe stenosis of a proximal M2 right middle cerebral artery vessel. 5. Up to moderate stenosis of the proximal basilar artery. 6. Attenuated appearance of distal right posterior cerebral artery branches. Electronically Signed   By: Jackey Loge D.O.   On: 10/17/2023 14:36   EEG adult Result Date: 10/17/2023 Jefferson Fuel, MD     10/17/2023 11:00 AM Routine EEG  Report CHRISTINE MORTON is a 73 y.o. female with a history of seizure who is undergoing an EEG to evaluate for seizures. Report: This EEG was acquired with electrodes placed according to the International 10-20 electrode system (including Fp1, Fp2, F3, F4, C3, C4, P3, P4, O1, O2, T3, T4, T5, T6, A1, A2, Fz, Cz, Pz). The following electrodes were missing or displaced: none. The occipital dominant rhythm was 10 Hz. This activity is reactive to stimulation. Drowsiness was manifested by background fragmentation; deeper stages of sleep were identified by K complexes and sleep spindles. There was no focal slowing. There were no interictal epileptiform discharges. There were no electrographic seizures identified. There was no abnormal response to photic stimulation or hyperventilation. Impression: This EEG was obtained while awake and asleep and is normal.   Clinical Correlation: Normal EEGs, however, do not rule out epilepsy. Bing Neighbors, MD [facility] [phone] If 7pm- 7am, please page neurology on call as listed in AMION.   MR BRAIN WO CONTRAST Result Date: 10/16/2023 CLINICAL DATA:  Neuro deficit, acute, stroke suspected EXAM: MRI HEAD WITHOUT CONTRAST TECHNIQUE: Multiplanar, multiecho pulse sequences of the brain and surrounding structures were obtained without intravenous contrast. COMPARISON:  Same day CT head. FINDINGS: Motion limited and incomplete  study due to patient intolerance. Brain: No acute infarction, hemorrhage, hydrocephalus, extra-axial collection or mass lesion. Remote right occipital infarct (PCA territory). Vascular: Major arterial flow voids are maintained skull base. Skull and upper cervical spine: Normal marrow signal. Sinuses/Orbits: Clear sinuses.  No acute orbital findings. Other: No mastoid effusions. IMPRESSION: 1. Motion limited and incomplete study without obvious acute abnormality. 2. Remote right PCA territory infarct. Electronically Signed   By: Feliberto Harts  M.D.   On: 10/16/2023 23:45   US Venous Img Upper Uni Left (DVT) Result Date: 10/16/2023 CLINICAL DATA:  244010 Left upper extremity swelling [id number] EXAM: Left UPPER EXTREMITY VENOUS DOPPLER ULTRASOUND TECHNIQUE: Gray-scale sonography with graded compression, as well as color Doppler and duplex ultrasound were performed to evaluate the upper extremity deep venous system from the level of the subclavian vein and including the jugular, axillary, basilic, radial, ulnar and upper cephalic vein. Spectral Doppler was utilized to evaluate flow at rest and with distal augmentation maneuvers. COMPARISON:  None Available. FINDINGS: Contralateral Subclavian Vein: Respiratory phasicity is normal and symmetric with the symptomatic side. No evidence of thrombus. Normal compressibility. Internal Jugular Vein: No evidence of thrombus. Normal compressibility, respiratory phasicity and response to augmentation. Subclavian Vein: No evidence of thrombus. Normal compressibility, respiratory phasicity and response to augmentation. Axillary Vein: No evidence of thrombus. Normal compressibility, respiratory phasicity and response to augmentation. Cephalic Vein: Not visualized. Basilic Vein: No evidence of thrombus. Normal compressibility, respiratory phasicity and response to augmentation. Brachial Veins: Not visualized. Radial Veins: Not visualized Ulnar Veins: Not visualized. Venous Reflux:  None visualized. Other Findings:  Subcutaneus soft tissue edema. IMPRESSION: No evidence of DVT within the left upper extremity with markedly limited evaluation: Cephalic, brachial, radial, ulnar veins not visualized. Electronically Signed   By: Tish Frederickson M.D.   On: 10/16/2023 20:14   ECHOCARDIOGRAM COMPLETE Result Date: 10/16/2023    ECHOCARDIOGRAM REPORT   Patient Name:   Anne Phillips Date of Exam: 10/16/2023 Medical Rec #:  [id number]        Height:       60.0 in Accession #:    [id number]       Weight:       130.1 lb Date of Birth:   Aug 17, 1951        BSA:          1.555 m Patient Age:    72 years         BP:           181/92 mmHg Patient Gender: F                HR:           64 bpm. Exam Location:  [facility] Procedure: 2D Echo, Cardiac Doppler and Color Doppler (Both Spectral and Color            Flow Doppler were utilized during procedure). Indications:     TIA G45.9  History:         Patient has prior history of Echocardiogram examinations. CAD;                  Risk Factors:Hypertension.  Sonographer:     Neysa Bonito Roar Referring Phys:  [id number] Francoise Schaumann NEWTON Diagnosing Phys: Debbe Odea MD IMPRESSIONS  1. Left ventricular ejection fraction, by estimation, is 55 to 60%. The left ventricle has normal function. The left ventricle has no regional wall motion abnormalities. There is moderate concentric left ventricular hypertrophy. Left ventricular diastolic parameters are  consistent with Grade II diastolic dysfunction (pseudonormalization).  2. Right ventricular systolic function is low normal. The right ventricular size is normal. There is moderately elevated pulmonary artery systolic pressure.  3. Left atrial size was moderately dilated.  4. Right atrial size was mildly dilated.  5. A small pericardial effusion is present. The pericardial effusion is circumferential.  6. The mitral valve is normal in structure. Mild mitral valve regurgitation.  7. Tricuspid valve regurgitation is mild to moderate.  8. The aortic valve is tricuspid. Aortic valve regurgitation is mild. Aortic valve sclerosis is present, with no evidence of aortic valve stenosis.  9. The inferior vena cava is normal in size with <50% respiratory variability, suggesting right atrial pressure of 8 mmHg. FINDINGS  Left Ventricle: Left ventricular ejection fraction, by estimation, is 55 to 60%. The left ventricle has normal function. The left ventricle has no regional wall motion abnormalities. Strain imaging was not performed. The left ventricular internal cavity  size was normal in  size. There is moderate concentric left ventricular hypertrophy. Left ventricular diastolic parameters are consistent with Grade II diastolic dysfunction (pseudonormalization). Right Ventricle: The right ventricular size is normal. No increase in right ventricular wall thickness. Right ventricular systolic function is low normal. There is moderately elevated pulmonary artery systolic pressure. The tricuspid regurgitant velocity  is 3.24 m/s, and with an assumed right atrial pressure of 8 mmHg, the estimated right ventricular systolic pressure is 50.0 mmHg. Left Atrium: Left atrial size was moderately dilated. Right Atrium: Right atrial size was mildly dilated. Pericardium: A small pericardial effusion is present. The pericardial effusion is circumferential. Mitral Valve: The mitral valve is normal in structure. Mild mitral valve regurgitation. MV peak gradient, 16.3 mmHg. The mean mitral valve gradient is 7.0 mmHg. Tricuspid Valve: The tricuspid valve is normal in structure. Tricuspid valve regurgitation is mild to moderate. Aortic Valve: The aortic valve is tricuspid. Aortic valve regurgitation is mild. Aortic regurgitation PHT measures 472 msec. Aortic valve sclerosis is present, with no evidence of aortic valve stenosis. Aortic valve mean gradient measures 7.0 mmHg. Aortic valve peak gradient measures 13.8 mmHg. Aortic valve area, by VTI measures 1.03 cm. Pulmonic Valve: The pulmonic valve was normal in structure. Pulmonic valve regurgitation is mild. Aorta: The aortic root and ascending aorta are structurally normal, with no evidence of dilitation. Venous: The inferior vena cava is normal in size with less than 50% respiratory variability, suggesting right atrial pressure of 8 mmHg. IAS/Shunts: No atrial level shunt detected by color flow Doppler. Additional Comments: 3D imaging was not performed.  LEFT VENTRICLE PLAX 2D LVIDd:         3.50 cm   Diastology LVIDs:         2.50 cm   LV e' medial:    4.35 cm/s LV  PW:         1.60 cm   LV E/e' medial:  34.0 LV IVS:        2.10 cm   LV e' lateral:   7.94 cm/s LVOT diam:     1.70 cm   LV E/e' lateral: 18.6 LV SV:         41 LV SV Index:   26 LVOT Area:     2.27 cm  RIGHT VENTRICLE RV Basal diam:  3.60 cm RV Mid diam:    2.60 cm RV S prime:     8.59 cm/s TAPSE (M-mode): 0.9 cm LEFT ATRIUM           Index  RIGHT ATRIUM           Index LA diam:      4.30 cm 2.77 cm/m   RA Area:     21.40 cm LA Vol (A4C): 74.2 ml 47.73 ml/m  RA Volume:   58.60 ml  37.69 ml/m  AORTIC VALVE                     PULMONIC VALVE AV Area (Vmax):    1.05 cm      PV Vmax:          1.12 m/s AV Area (Vmean):   0.92 cm      PV Peak grad:     5.0 mmHg AV Area (VTI):     1.03 cm      PR End Diast Vel: 13.84 msec AV Vmax:           186.00 cm/s   RVOT Peak grad:   1 mmHg AV Vmean:          121.000 cm/s AV VTI:            0.398 m AV Peak Grad:      13.8 mmHg AV Mean Grad:      7.0 mmHg LVOT Vmax:         85.90 cm/s LVOT Vmean:        49.000 cm/s LVOT VTI:          0.181 m LVOT/AV VTI ratio: 0.45 AI PHT:            472 msec  AORTA Ao Root diam: 2.60 cm Ao Asc diam:  2.90 cm MITRAL VALVE                TRICUSPID VALVE MV Area (PHT): 4.36 cm     TR Peak grad:   42.0 mmHg MV Area VTI:   1.21 cm     TR Vmax:        324.00 cm/s MV Peak grad:  16.3 mmHg MV Mean grad:  7.0 mmHg     SHUNTS MV Vmax:       2.02 m/s     Systemic VTI:  0.18 m MV Vmean:      117.0 cm/s   Systemic Diam: 1.70 cm MV Decel Time: 174 msec MV E velocity: 148.00 cm/s MV A velocity: 77.10 cm/s MV E/A ratio:  1.92 Debbe Odea MD Electronically signed by Debbe Odea MD Signature Date/Time: 10/16/2023/5:41:41 PM    Final    DG Abd 1 View Result Date: 10/16/2023 CLINICAL DATA:  62952 TIA (transient ischemic attack) [id number] EXAM: ABDOMEN - 1 VIEW COMPARISON:  08/24/2015 FINDINGS: The bowel gas pattern is nonobstructive. No radio-opaque calculi or other significant radiographic abnormality are seen. Prominent atherosclerotic  vascular calcifications. IMPRESSION: Negative. Electronically Signed   By: Duanne Guess D.O.   On: 10/16/2023 16:02   DG Chest 1 View Result Date: 10/16/2023 CLINICAL DATA:  44010 TIA (transient ischemic attack) [id number] EXAM: CHEST  1 VIEW COMPARISON:  12/31/2022 FINDINGS: Moderate cardiomegaly. Aortic atherosclerosis. No focal airspace consolidation, pleural effusion, or pneumothorax. IMPRESSION: Moderate cardiomegaly. No acute cardiopulmonary findings. Electronically Signed   By: Duanne Guess D.O.   On: 10/16/2023 16:01   DG HIPS BILAT WITH PELVIS 2V Result Date: 10/16/2023 CLINICAL DATA:  27253 TIA (transient ischemic attack) [id number] EXAM: DG HIP (WITH OR WITHOUT PELVIS) 2V BILAT COMPARISON:  None Available. FINDINGS: Sclerotic and lucent changes of the bilateral femoral heads suggesting avascular necrosis. Flattening of  both femoral head contours, right worse than left. Hip joints remain aligned without dislocation. Mild to moderate degenerative changes bilaterally. No pelvic diastasis. Atherosclerotic vascular calcifications. 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 Flattening of both femoral head contours, right worse than left. Electronically Signed   By: Duanne Guess D.O.   On: 10/16/2023 16:00   CT Head Wo Contrast Result Date: 10/16/2023 CLINICAL DATA:  Seizure disorder, clinical change EXAM: CT HEAD WITHOUT CONTRAST TECHNIQUE: Contiguous axial images were obtained from the base of the skull through the vertex without intravenous contrast. RADIATION DOSE REDUCTION: This exam was performed according to the departmental dose-optimization program which includes automated exposure control, adjustment of the mA and/or kV according to patient size and/or use of iterative reconstruction technique. COMPARISON:  Head CT 12/31/2022 FINDINGS: Brain: No hemorrhage. No hydrocephalus. No extra-axial fluid collection. No mass effect. No mass lesion. Compared to prior  exam there is a new chronic appearing, but technically age indeterminate infarct right occipital lobe Vascular: No hyperdense vessel or unexpected calcification. Skull: Normal. Negative for fracture or focal lesion. Sinuses/Orbits: No middle ear or mastoid effusion. Paranasal sinuses clear. Orbits are unremarkable Other: None. 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 If there is clinical concern for an acute infarct, consider further evaluation with MRI. Electronically Signed   By: Lorenza Cambridge M.D.   On: 10/16/2023 08:55    Microbiology: Results for orders placed or performed during the hospital encounter of 12/31/22  Culture, blood (Routine X 2) w Reflex to ID Panel     Status: None   Collection Time: 01/04/23  5:53 PM   Specimen: BLOOD LEFT HAND  Result Value Ref Range Status   Specimen Description BLOOD LEFT HAND  Final   Special Requests   Final    BOTTLES DRAWN AEROBIC AND ANAEROBIC Blood Culture results may not be optimal due to an excessive volume of blood received in culture bottles   Culture   Final    NO GROWTH 5 DAYS Performed at [facility], [address] [place], [place] [address]    Report Status 01/09/2023 FINAL  Final  Culture, blood (Routine X 2) w Reflex to ID Panel     Status: None   Collection Time: 01/04/23  6:01 PM   Specimen: BLOOD  Result Value Ref Range Status   Specimen Description BLOOD RIGHT ANTECUBITAL  Final   Special Requests   Final    BOTTLES DRAWN AEROBIC AND ANAEROBIC Blood Culture adequate volume   Culture   Final    NO GROWTH 5 DAYS Performed at [facility], [address] [place], [place] [address]    Report Status 01/09/2023 FINAL  Final    Labs: CBC: Recent Labs  Lab 10/19/23 0705 10/20/23 0617 10/22/23 2038  WBC 9.2 8.5 8.8  NEUTROABS 7.9* 7.0  --   HGB 9.8* 10.3* 11.0*  HCT 29.1* 30.5* 33.0*  MCV 86.6 86.4 88.0  PLT  147* 149* 195   Basic Metabolic Panel: Recent Labs  Lab 10/17/23 0650 10/18/23 0537 10/19/23 0705 10/20/23 0617 10/22/23 2038 10/23/23 0008 10/23/23 0831 10/23/23 1039  NA 133*   < > 128* 128* 126* 128* 128* 129*  K 3.2*   < > 4.0 4.2 3.7 3.2* 3.9 4.2  CL 92*   < > 94* 94* 89* 96* 94* 96*  CO2 29   < > 27 24 23 23 25  24  GLUCOSE 155*   < > 89 83 102* 107* 91 98  BUN 18   < > 32* 36* 33* 38* 30* 30*  CREATININE 1.12*   < > 1.27* 1.17* 1.39* 1.34* 1.35* 1.34*  CALCIUM 7.9*   < > 7.8* 8.1* 8.7* 7.3* 7.8* 8.0*  MG 3.0*  --  1.9 1.7  --   --   --   --   PHOS 3.4  --  2.0* 2.7  --   --   --   --    < > = values in this interval not displayed.   Liver Function Tests: Recent Labs  Lab 10/19/23 0705 10/20/23 0617 10/22/23 2038  AST 23 17 41  ALT 19 18 48*  ALKPHOS 49 50 63  BILITOT 0.8 0.6 0.5  PROT 5.9* 6.1* 7.0  ALBUMIN 2.9* 2.9* 3.5   CBG: No results for input(s): "GLUCAP" in the last 168 hours.  Discharge time spent: 33 minutes.  Signed: Marcelino Duster, MD [facility] 10/23/2023

## 2023-10-23 NOTE — ED Notes (Signed)
Pt disconnected to use the restroom again. Pt states that she normally does not urinate this much, but is able to get up on her own.

## 2023-10-23 NOTE — ED Notes (Signed)
Pt resting in stretcher at this time. Pt ABCs intact. RR even and unlabored. Pt in NAD. Bed in lowest locked position. Call bell in reach. Denies needs at this time.

## 2023-10-23 NOTE — ED Notes (Signed)
Pt resting in bed. Pt ABCs intact. RR even and unlabored. Pt in NAD. Bed in lowest locked position. Call bell in reach. Denies needs at this time.

## 2023-10-23 NOTE — ED Notes (Signed)
Patient ambulatory back to bed, and placed back on cardiac monitoring

## 2023-10-23 NOTE — ED Notes (Signed)
Patient unhooked from the monitor, ambulatory to the bathroom in room.

## 2023-10-23 NOTE — Progress Notes (Signed)
PT Cancellation Note  Patient Details Name: Anne Phillips MRN: [id number] DOB: [birth year]   Cancelled Treatment:    Reason Eval/Treat Not Completed: Fatigue/lethargy limiting ability to participate Patient sleeping, attempted to wake her but not very successful. Will re-attempt as able.   [name] 10/23/2023, 2:09 PM

## 2023-10-23 NOTE — ED Notes (Signed)
Pt ABCs intact. RR even and unlabored. Pt in NAD. Bed in lowest locked position. Call bell in reach. Denies needs at this time.

## 2023-10-23 NOTE — ED Notes (Signed)
Informed RN bed assigned 

## 2023-10-24 LAB — LEVETIRACETAM LEVEL: Levetiracetam Lvl: 83.3 ug/mL — ABNORMAL HIGH (ref 10.0–40.0)

## 2023-10-25 LAB — LACOSAMIDE: Lacosamide: 10.8 ug/mL — ABNORMAL HIGH (ref 5.0–10.0)

## 2023-10-26 ENCOUNTER — Other Ambulatory Visit: Payer: Self-pay

## 2023-10-31 ENCOUNTER — Observation Stay

## 2023-10-31 ENCOUNTER — Observation Stay
Admission: EM | Admit: 2023-10-31 | Discharge: 2023-11-02 | Disposition: A | Discharge status: Home Health | Attending: Internal Medicine | Admitting: Internal Medicine

## 2023-10-31 ENCOUNTER — Encounter: Payer: Self-pay | Admitting: Emergency Medicine

## 2023-10-31 ENCOUNTER — Other Ambulatory Visit: Payer: Self-pay

## 2023-10-31 DIAGNOSIS — N183 Chronic kidney disease, stage 3 unspecified: Secondary | ICD-10-CM

## 2023-10-31 DIAGNOSIS — G40909 Epilepsy, unspecified, not intractable, without status epilepticus: Secondary | ICD-10-CM

## 2023-10-31 DIAGNOSIS — Z8673 Personal history of transient ischemic attack (TIA), and cerebral infarction without residual deficits: Secondary | ICD-10-CM | POA: Diagnosis not present

## 2023-10-31 DIAGNOSIS — R2232 Localized swelling, mass and lump, left upper limb: Secondary | ICD-10-CM | POA: Insufficient documentation

## 2023-10-31 DIAGNOSIS — E871 Hypo-osmolality and hyponatremia: Secondary | ICD-10-CM | POA: Insufficient documentation

## 2023-10-31 DIAGNOSIS — F17210 Nicotine dependence, cigarettes, uncomplicated: Secondary | ICD-10-CM | POA: Diagnosis not present

## 2023-10-31 DIAGNOSIS — D649 Anemia, unspecified: Secondary | ICD-10-CM

## 2023-10-31 DIAGNOSIS — I5032 Chronic diastolic (congestive) heart failure: Secondary | ICD-10-CM | POA: Diagnosis not present

## 2023-10-31 DIAGNOSIS — R569 Unspecified convulsions: Principal | ICD-10-CM | POA: Insufficient documentation

## 2023-10-31 DIAGNOSIS — Z7982 Long term (current) use of aspirin: Secondary | ICD-10-CM | POA: Diagnosis not present

## 2023-10-31 DIAGNOSIS — D631 Anemia in chronic kidney disease: Secondary | ICD-10-CM | POA: Diagnosis not present

## 2023-10-31 DIAGNOSIS — E0590 Thyrotoxicosis, unspecified without thyrotoxic crisis or storm: Secondary | ICD-10-CM | POA: Diagnosis not present

## 2023-10-31 DIAGNOSIS — I10 Essential (primary) hypertension: Secondary | ICD-10-CM | POA: Diagnosis present

## 2023-10-31 DIAGNOSIS — I130 Hypertensive heart and chronic kidney disease with heart failure and stage 1 through stage 4 chronic kidney disease, or unspecified chronic kidney disease: Secondary | ICD-10-CM | POA: Insufficient documentation

## 2023-10-31 DIAGNOSIS — Z79899 Other long term (current) drug therapy: Secondary | ICD-10-CM | POA: Insufficient documentation

## 2023-10-31 DIAGNOSIS — G9341 Metabolic encephalopathy: Secondary | ICD-10-CM | POA: Diagnosis not present

## 2023-10-31 DIAGNOSIS — I4891 Unspecified atrial fibrillation: Secondary | ICD-10-CM | POA: Diagnosis not present

## 2023-10-31 DIAGNOSIS — Z7901 Long term (current) use of anticoagulants: Secondary | ICD-10-CM | POA: Insufficient documentation

## 2023-10-31 DIAGNOSIS — N1832 Chronic kidney disease, stage 3b: Secondary | ICD-10-CM | POA: Diagnosis not present

## 2023-10-31 DIAGNOSIS — I5030 Unspecified diastolic (congestive) heart failure: Secondary | ICD-10-CM | POA: Diagnosis not present

## 2023-10-31 DIAGNOSIS — G40919 Epilepsy, unspecified, intractable, without status epilepticus: Secondary | ICD-10-CM | POA: Diagnosis present

## 2023-10-31 DIAGNOSIS — Z853 Personal history of malignant neoplasm of breast: Secondary | ICD-10-CM | POA: Diagnosis not present

## 2023-10-31 DIAGNOSIS — I2510 Atherosclerotic heart disease of native coronary artery without angina pectoris: Secondary | ICD-10-CM | POA: Diagnosis not present

## 2023-10-31 DIAGNOSIS — M7989 Other specified soft tissue disorders: Secondary | ICD-10-CM | POA: Insufficient documentation

## 2023-10-31 DIAGNOSIS — Z91148 Patient's other noncompliance with medication regimen for other reason: Secondary | ICD-10-CM

## 2023-10-31 LAB — BASIC METABOLIC PANEL
Anion gap: 12 (ref 5–15)
BUN: 26 mg/dL — ABNORMAL HIGH (ref 8–23)
CO2: 16 mmol/L — ABNORMAL LOW (ref 22–32)
Calcium: 8.7 mg/dL — ABNORMAL LOW (ref 8.9–10.3)
Chloride: 102 mmol/L (ref 98–111)
Creatinine, Ser: 1.1 mg/dL — ABNORMAL HIGH (ref 0.44–1.00)
GFR, Estimated: 54 mL/min — ABNORMAL LOW (ref >60)
Glucose, Bld: 95 mg/dL (ref 70–99)
Potassium: 3.7 mmol/L (ref 3.5–5.1)
Sodium: 130 mmol/L — ABNORMAL LOW (ref 135–145)

## 2023-10-31 LAB — CBC
HCT: 27.3 % — ABNORMAL LOW (ref 36.0–46.0)
Hemoglobin: 8.9 g/dL — ABNORMAL LOW (ref 12.0–15.0)
MCH: 29.5 pg (ref 26.0–34.0)
MCHC: 32.6 g/dL (ref 30.0–36.0)
MCV: 90.4 fL (ref 80.0–100.0)
Platelets: 178 10*3/uL (ref 150–400)
RBC: 3.02 MIL/uL — ABNORMAL LOW (ref 3.87–5.11)
RDW: 15.9 % — ABNORMAL HIGH (ref 11.5–15.5)
WBC: 6.8 10*3/uL (ref 4.0–10.5)
nRBC: 0 % (ref 0.0–0.2)

## 2023-10-31 LAB — URINALYSIS, ROUTINE W REFLEX MICROSCOPIC
Bacteria, UA: NONE SEEN
Bilirubin Urine: NEGATIVE
Glucose, UA: 50 mg/dL — AB
Ketones, ur: NEGATIVE mg/dL
Leukocytes,Ua: NEGATIVE
Nitrite: NEGATIVE
Protein, ur: 100 mg/dL — AB
Specific Gravity, Urine: 1.025 (ref 1.005–1.030)
pH: 5 (ref 5.0–8.0)

## 2023-10-31 LAB — HEPATIC FUNCTION PANEL
ALT: 16 U/L (ref 0–44)
AST: 17 U/L (ref 15–41)
Albumin: 3.5 g/dL (ref 3.5–5.0)
Alkaline Phosphatase: 64 U/L (ref 38–126)
Bilirubin, Direct: 0.1 mg/dL (ref 0.0–0.2)
Indirect Bilirubin: 0.6 mg/dL (ref 0.3–0.9)
Total Bilirubin: 0.7 mg/dL (ref 0.0–1.2)
Total Protein: 7.3 g/dL (ref 6.5–8.1)

## 2023-10-31 LAB — MAGNESIUM: Magnesium: 1.3 mg/dL — ABNORMAL LOW (ref 1.7–2.4)

## 2023-10-31 LAB — CBG MONITORING, ED: Glucose-Capillary: 84 mg/dL (ref 70–99)

## 2023-10-31 MED ORDER — LEVETIRACETAM IN NACL 1000 MG/100ML IV SOLN
1000.0000 mg | Freq: Once | INTRAVENOUS | Status: AC
  Administered 2023-10-31: 1000 mg via INTRAVENOUS
  Filled 2023-10-31: qty 100

## 2023-10-31 MED ORDER — METHIMAZOLE 5 MG PO TABS
5.0000 mg | ORAL_TABLET | Freq: Every day | ORAL | Status: DC
  Administered 2023-10-31 – 2023-11-02 (×3): 5 mg via ORAL
  Filled 2023-10-31 (×3): qty 1

## 2023-10-31 MED ORDER — LEVETIRACETAM 500 MG PO TABS
500.0000 mg | ORAL_TABLET | Freq: Two times a day (BID) | ORAL | Status: DC
  Administered 2023-10-31 – 2023-11-02 (×5): 500 mg via ORAL
  Filled 2023-10-31 (×5): qty 1

## 2023-10-31 MED ORDER — LORAZEPAM 2 MG/ML IJ SOLN: 1.0000 mg | INTRAMUSCULAR | Status: DC | PRN

## 2023-10-31 MED ORDER — SODIUM CHLORIDE 0.9 % IV SOLN
150.0000 mg | Freq: Once | INTRAVENOUS | Status: AC
  Administered 2023-10-31: 150 mg via INTRAVENOUS
  Filled 2023-10-31: qty 15

## 2023-10-31 MED ORDER — LORAZEPAM 2 MG/ML IJ SOLN
1.0000 mg | Freq: Once | INTRAMUSCULAR | Status: AC
  Administered 2023-10-31: 1 mg via INTRAVENOUS
  Filled 2023-10-31: qty 1

## 2023-10-31 MED ORDER — ATORVASTATIN CALCIUM 20 MG PO TABS
40.0000 mg | ORAL_TABLET | Freq: Every day | ORAL | Status: DC
  Administered 2023-10-31 – 2023-11-02 (×3): 40 mg via ORAL
  Filled 2023-10-31 (×3): qty 2

## 2023-10-31 MED ORDER — ONDANSETRON HCL 4 MG PO TABS: 4.0000 mg | ORAL_TABLET | Freq: Four times a day (QID) | ORAL | Status: DC | PRN

## 2023-10-31 MED ORDER — ONDANSETRON HCL 4 MG/2ML IJ SOLN: 4.0000 mg | Freq: Four times a day (QID) | INTRAMUSCULAR | Status: DC | PRN

## 2023-10-31 MED ORDER — ORAL CARE MOUTH RINSE: 15.0000 mL | OROMUCOSAL | Status: DC | PRN

## 2023-10-31 MED ORDER — ENOXAPARIN SODIUM 40 MG/0.4ML IJ SOSY
40.0000 mg | PREFILLED_SYRINGE | INTRAMUSCULAR | Status: DC
  Administered 2023-10-31 – 2023-11-01 (×2): 40 mg via SUBCUTANEOUS
  Filled 2023-10-31 (×2): qty 0.4

## 2023-10-31 MED ORDER — PANTOPRAZOLE SODIUM 40 MG PO TBEC
40.0000 mg | DELAYED_RELEASE_TABLET | Freq: Every day | ORAL | Status: DC
Start: 2023-10-31 — End: 2023-11-02
  Administered 2023-10-31 – 2023-11-02 (×3): 40 mg via ORAL
  Filled 2023-10-31 (×3): qty 1

## 2023-10-31 MED ORDER — LACOSAMIDE 50 MG PO TABS
150.0000 mg | ORAL_TABLET | Freq: Two times a day (BID) | ORAL | Status: DC
  Administered 2023-10-31 – 2023-11-02 (×5): 150 mg via ORAL
  Filled 2023-10-31 (×5): qty 3

## 2023-10-31 MED ORDER — ASPIRIN 81 MG PO TBEC
81.0000 mg | DELAYED_RELEASE_TABLET | Freq: Every day | ORAL | Status: DC
  Administered 2023-10-31 – 2023-11-02 (×3): 81 mg via ORAL
  Filled 2023-10-31 (×4): qty 1

## 2023-10-31 MED ORDER — SODIUM CHLORIDE 0.9 % IV SOLN
75.0000 mL/h | INTRAVENOUS | Status: AC
  Administered 2023-10-31 – 2023-11-01 (×2): 75 mL/h via INTRAVENOUS

## 2023-10-31 MED ORDER — ORAL CARE MOUTH RINSE
15.0000 mL | OROMUCOSAL | Status: DC
  Administered 2023-10-31 – 2023-11-01 (×3): 15 mL via OROMUCOSAL
  Filled 2023-10-31 (×10): qty 15

## 2023-10-31 NOTE — ED Notes (Signed)
 Helped pt walk to toilet with walker. Pt asking for food. Will do swallow screen.

## 2023-10-31 NOTE — Discharge Instructions (Signed)
 Please take your Keppra and Vimpat as prescribed.  These medications were sent to your pharmacy [facility]) on 10/23/2023.

## 2023-10-31 NOTE — ED Notes (Signed)
 Assumed patient care and received report from the previous nurse.

## 2023-10-31 NOTE — ED Notes (Signed)
Helped pt to toilet. 

## 2023-10-31 NOTE — ED Provider Notes (Signed)
 [facility] Provider Note    Event Date/Time   First MD Initiated Contact with Patient 10/31/23 [phone]     (approximate)   History   Seizures   HPI  Anne Phillips is a 73 y.o. female with history of hypertension, hyperlipidemia, CAD, hypothyroidism, stroke, epilepsy, breast cancer who presents to the emergency department with complaints of intermittent partial seizures involving the left upper extremity ongoing for the past day.  This is patient's third visit to the emergency department for seizure activity within the past couple of weeks.  She was admitted to the hospital twice.  She is supposed to be on Keppra and Vimpat but states she was not aware that she was supposed to be taking it and states she does not think she has been taking it at home.  No fevers, cough, chest pain, shortness of breath, vomiting, diarrhea.  No headache.  No head injury.  No numbness, tingling or focal weakness.  Has chronic lymphedema of the left upper extremity from breast cancer.  States this is unchanged.   History provided by patient.    Past Medical History:  Diagnosis Date   Cancer Procedure [facility])    Coronary artery disease    Hyperlipidemia    Hypertension    Seizures (HCC)    Slurred speech 10/11/2022   Thyroid disease     Past Surgical History:  Procedure Laterality Date   MASTECTOMY Left     MEDICATIONS:  Prior to Admission medications   Medication Sig Start Date End Date Taking? Authorizing Provider  acetaminophen (TYLENOL) 325 MG tablet Take 2 tablets (650 mg total) by mouth every 6 (six) hours as needed for mild pain (or Fever >/= 101). 10/05/20   Zannie Cove, MD  ammonium lactate (AMLACTIN) 12 % cream Apply 1 Application topically 2 (two) times daily.    [provider]  aspirin EC 81 MG tablet Take 1 tablet (81 mg total) by mouth daily. Swallow whole. 10/18/22   Delfino Lovett, MD  atorvastatin (LIPITOR) 40 MG tablet Take 1 tablet (40 mg total) by mouth  daily. 10/23/23   Marcelino Duster, MD  calcium carbonate (CALCIUM ANTACID) 500 MG chewable tablet Chew 1 tablet (200 mg of elemental calcium total) by mouth 2 (two) times daily with a meal. 10/23/23 11/22/23  Marcelino Duster, MD  carvedilol (COREG) 6.25 MG tablet Take 1 tablet (6.25 mg total) by mouth 2 (two) times daily. 10/23/23 11/22/23  Marcelino Duster, MD  cyanocobalamin 1000 MCG tablet Take 1 tablet (1,000 mcg total) by mouth daily. 10/23/23 11/22/23  Marcelino Duster, MD  empagliflozin (JARDIANCE) 10 MG TABS tablet Take 1 tablet (10 mg total) by mouth daily. 10/23/23 11/22/23  Marcelino Duster, MD  feeding supplement (ENSURE ENLIVE / ENSURE PLUS) LIQD Take 237 mLs by mouth 2 (two) times daily between meals. 10/17/22   Delfino Lovett, MD  Lacosamide 150 MG TABS Take 1 tablet (150 mg total) by mouth 2 (two) times daily. 10/23/23 11/22/23  Marcelino Duster, MD  levETIRAcetam (KEPPRA) 500 MG tablet Take 1 tablet (500 mg total) by mouth 2 (two) times daily. 10/23/23 11/22/23  Marcelino Duster, MD  losartan (COZAAR) 50 MG tablet Take 1 tablet (50 mg total) by mouth daily. 10/23/23   Marcelino Duster, MD  methimazole (TAPAZOLE) 5 MG tablet Take 1 tablet (5 mg total) by mouth daily. 10/23/23 11/22/23  Marcelino Duster, MD  Multiple Vitamin (MULTIVITAMIN WITH MINERALS) TABS tablet Take 1 tablet by mouth daily. 10/23/23 11/22/23  Marcelino Duster,  MD  nicotine (NICODERM CQ - DOSED IN MG/24 HOURS) 14 mg/24hr patch Place 1 patch (14 mg total) onto the skin daily. 10/23/23   Marcelino Duster, MD  nitroGLYCERIN (NITROSTAT) 0.4 MG SL tablet Place 0.4 mg under the tongue every 5 (five) minutes as needed for chest pain.    [provider]  pantoprazole (PROTONIX) 40 MG tablet Take 1 tablet (40 mg total) by mouth daily. 10/23/23 11/22/23  Marcelino Duster, MD    Physical Exam   Triage Vital Signs: ED Triage Vitals [10/31/23 0209]  Encounter Vitals Group     BP  (!) 176/84     Systolic BP Percentile      Diastolic BP Percentile      Pulse Rate 90     Resp 18     Temp 98.1 F (36.7 C)     Temp Source Oral     SpO2 98 %     Weight 119 lb 14.9 oz (54.4 kg)     Height 5' (1.524 m)     Head Circumference      Peak Flow      Pain Score 0     Pain Loc      Pain Education      Exclude from Growth Chart     Most recent vital signs: Vitals:   10/31/23 0545 10/31/23 0600  BP:    Pulse: 80 81  Resp:    Temp:    SpO2: 100% 100%    CONSTITUTIONAL: Alert, responds appropriately to questions.  Elderly, chronically ill-appearing HEAD: Normocephalic, atraumatic EYES: Conjunctivae clear, pupils appear equal, sclera nonicteric ENT: normal nose; moist mucous membranes, poor dentition NECK: Supple, normal ROM CARD: RRR; S1 and S2 appreciated RESP: Normal chest excursion without splinting or tachypnea; breath sounds clear and equal bilaterally; no wheezes, no rhonchi, no rales, no hypoxia or respiratory distress, speaking full sentences ABD/GI: Non-distended; soft, non-tender, no rebound, no guarding, no peritoneal signs BACK: The back appears normal EXT: Normal ROM in all joints; lymphedema noted to the left upper extremity which she reports is chronic, 2+ left radial pulse, compartments soft SKIN: Normal color for age and race; warm; no rash on exposed skin NEURO: Moves all extremities equally, normal speech, no facial asymmetry, normal sensation diffusely, intermittent contracting of the left upper extremity that lasted less than 60 seconds at a time which she states is similar to her partial seizures PSYCH: The patient's mood and manner are appropriate.   ED Results / Procedures / Treatments   LABS: (all labs ordered are listed, but only abnormal results are displayed) Labs Reviewed  BASIC METABOLIC PANEL - Abnormal; Notable for the following components:      Result Value   Sodium 130 (*)    CO2 16 (*)    BUN 26 (*)    Creatinine, Ser 1.10  (*)    Calcium 8.7 (*)    GFR, Estimated 54 (*)    All other components within normal limits  CBC - Abnormal; Notable for the following components:   RBC 3.02 (*)    Hemoglobin 8.9 (*)    HCT 27.3 (*)    RDW 15.9 (*)    All other components within normal limits  URINALYSIS, ROUTINE W REFLEX MICROSCOPIC - Abnormal; Notable for the following components:   Color, Urine YELLOW (*)    APPearance CLEAR (*)    Glucose, UA 50 (*)    Hgb urine dipstick SMALL (*)    Protein, ur 100 (*)  All other components within normal limits  HEPATIC FUNCTION PANEL  LEVETIRACETAM LEVEL  LACOSAMIDE  CBG MONITORING, ED     EKG:     RADIOLOGY: My personal review and interpretation of imaging:    I have personally reviewed all radiology reports.   No results found.   PROCEDURES:  Critical Care performed: Yes, see critical care procedure note(s)   CRITICAL CARE Performed by: Rochele Raring   Total critical care time: 40 minutes  Critical care time was exclusive of separately billable procedures and treating other patients.  Critical care was necessary to treat or prevent imminent or life-threatening deterioration.  Critical care was time spent personally by me on the following activities: development of treatment plan with patient and/or surrogate as well as nursing, discussions with consultants, evaluation of patient's response to treatment, examination of patient, obtaining history from patient or surrogate, ordering and performing treatments and interventions, ordering and review of laboratory studies, ordering and review of radiographic studies, pulse oximetry and re-evaluation of patient's condition.   Marland Kitchen1-3 Lead EKG Interpretation  Performed by: Mateja Dier, Layla Maw, DO Authorized by: Simcha Speir, Layla Maw, DO     Interpretation: normal     ECG rate:  90   ECG rate assessment: normal     Rhythm: sinus rhythm     Ectopy: none     Conduction: normal       IMPRESSION / MDM / ASSESSMENT  AND PLAN / ED COURSE  I reviewed the triage vital signs and the nursing notes.    Patient here with partial seizures of the left upper extremity.  Patient states she is not taking her Keppra or Vimpat.  Has history of medical noncompliance with 2 recent admissions for the same.  The patient is on the cardiac monitor to evaluate for evidence of arrhythmia and/or significant heart rate changes.   DIFFERENTIAL DIAGNOSIS (includes but not limited to):   Seizure, medical noncompliance, electrolyte derangement, UTI, dehydration, doubt stroke or intracranial hemorrhage   Patient's presentation is most consistent with acute presentation with potential threat to life or bodily function.   PLAN: Labs today show a bicarb of 16 but normal anion gap.  Sodium slightly low at 130 but this has improved compared to previous.  Normal potassium.  Chronic anemia.  Hemoglobin today is 8.9 which is down from 11 on 10/22/2023 but she denies bloody stools, melena.  Blood sugar is normal.  Urine shows no sign of infection.  Will check Keppra and lacosamide level as it is unclear if she is taking this medication or not.  Will give IV Keppra and Vimpat here and monitor.  Patient may require admission if seizure activity persists.   MEDICATIONS GIVEN IN ED: Medications  lacosamide (VIMPAT) 150 mg in sodium chloride 0.9 % 25 mL IVPB (150 mg Intravenous New Bag/Given 10/31/23 0552)  LORazepam (ATIVAN) injection 1 mg (has no administration in time range)  levETIRAcetam (KEPPRA) IVPB 1000 mg/100 mL premix (0 mg Intravenous Stopped 10/31/23 0447)     ED COURSE: Patient continuing to have intermittent partial seizures of the left upper extremity after IV Keppra and Vimpat.  Will give dose of Ativan.  Will discuss with hospitalist for admission.   CONSULTS:  Consulted and discussed patient's case with hospitalist, Dr. Para March.  I have recommended admission and consulting physician agrees and will place admission orders.   Patient (and family if present) agree with this plan.   I reviewed all nursing notes, vitals, pertinent previous records.  All labs,  EKGs, imaging ordered have been independently reviewed and interpreted by myself.    OUTSIDE RECORDS REVIEWED: Reviewed patient's recent admissions on 10/16/2023 and 10/22/2023.  She had a negative MRI of her brain and CT of her head during these admissions as well that showed no acute abnormality but redemonstrated a remote right PCA infarct.       FINAL CLINICAL IMPRESSION(S) / ED DIAGNOSES   Final diagnoses:  Partial seizures (HCC)     Rx / DC Orders   ED Discharge Orders     None        Note:  This document was prepared using Dragon voice recognition software and may include unintentional dictation errors.   Paulina Muchmore, Layla Maw, DO 10/31/23 [phone]

## 2023-10-31 NOTE — ED Notes (Signed)
 Pt to ED via [facility] c/o seizures. Per EMS pt called them because her left arm would lock up and shake, thinks its a seizure

## 2023-10-31 NOTE — H&P (Addendum)
 History and Physical    Patient: Anne Phillips VWU:[id number] DOB: 1951-05-28 DOA: 10/31/2023 DOS: the patient was seen and examined on 10/31/2023 PCP: Gillis Santa, MD  Patient coming from: Home  Chief Complaint:  Chief Complaint  Patient presents with   Seizures   HPI: Anne Phillips is a 73 y.o. female with medical history significant of seizure, hypertension, hyperlipidemia, CAD, breast cancer (mastectomy), dCHF, stroke, CKD stage IIIb, chronic hyponatremia, recently diagnosed to recent A-fib not on [facility], hyperthyroidism presenting with recurrent seizure and encephalopathy.  Limited history in the setting of encephalopathy.  Patient noted to be admitted February 20-Feb 21 for breakthrough seizure.  Has had imaging during the month of February including MRI of the brain as well as CT head and CT angio of the head neck to have been grossly stable.  Was felt to be secondary to noncompliance with Keppra and Vimpat.  Patient brought in by EMS overnight secondary to left hand shaking that felt to be progressing through her whole body.  Unclear if patient has been compliant with home antiepileptic regimen.  No reported fevers chills, nausea or vomiting.  No reports of chest pain, shortness of breath or abdominal pain. Presented to the ER afebrile, hemodynamically stable.  Satting well on room air.  White count 6.8, hemoglobin 8.9, platelets 178, creatinine 1.1 with GFR in the 50s.  Magnesium 1.3. Review of Systems: As mentioned in the history of present illness. All other systems reviewed and are negative. Past Medical History:  Diagnosis Date   Cancer [facility])    Coronary artery disease    Hyperlipidemia    Hypertension    Seizures (HCC)    Slurred speech 10/11/2022   Thyroid disease    Past Surgical History:  Procedure Laterality Date   MASTECTOMY Left    Social History:  reports that she has been smoking cigarettes. She has never used smokeless tobacco. She reports that she does not drink  alcohol and does not use drugs.  Allergies  Allergen Reactions   Iodinated Contrast Media Hives and Other (See Comments)    Rash  Other Reaction(s): Other (See Comments)  Rash   Rash   Rash    Rash  Rash   Other Hives    Metal-reaction=rash Metal-reaction=rash     Family History  Problem Relation Age of Onset   Hypertension Other     Prior to Admission medications   Medication Sig Start Date End Date Taking? Authorizing Provider  acetaminophen (TYLENOL) 325 MG tablet Take 2 tablets (650 mg total) by mouth every 6 (six) hours as needed for mild pain (or Fever >/= 101). 10/05/20   Zannie Cove, MD  ammonium lactate (AMLACTIN) 12 % cream Apply 1 Application topically 2 (two) times daily.    [provider]  aspirin EC 81 MG tablet Take 1 tablet (81 mg total) by mouth daily. Swallow whole. 10/18/22   Delfino Lovett, MD  atorvastatin (LIPITOR) 40 MG tablet Take 1 tablet (40 mg total) by mouth daily. 10/23/23   Marcelino Duster, MD  calcium carbonate (CALCIUM ANTACID) 500 MG chewable tablet Chew 1 tablet (200 mg of elemental calcium total) by mouth 2 (two) times daily with a meal. 10/23/23 11/22/23  Marcelino Duster, MD  carvedilol (COREG) 6.25 MG tablet Take 1 tablet (6.25 mg total) by mouth 2 (two) times daily. 10/23/23 11/22/23  Marcelino Duster, MD  cyanocobalamin 1000 MCG tablet Take 1 tablet (1,000 mcg total) by mouth daily. 10/23/23 11/22/23  Marcelino Duster, MD  empagliflozin (JARDIANCE) 10 MG TABS tablet Take 1 tablet (10 mg total) by mouth daily. 10/23/23 11/22/23  Marcelino Duster, MD  feeding supplement (ENSURE ENLIVE / ENSURE PLUS) LIQD Take 237 mLs by mouth 2 (two) times daily between meals. 10/17/22   Delfino Lovett, MD  Lacosamide 150 MG TABS Take 1 tablet (150 mg total) by mouth 2 (two) times daily. 10/23/23 11/22/23  Marcelino Duster, MD  levETIRAcetam (KEPPRA) 500 MG tablet Take 1 tablet (500 mg total) by mouth 2 (two) times daily. 10/23/23  11/22/23  Marcelino Duster, MD  losartan (COZAAR) 50 MG tablet Take 1 tablet (50 mg total) by mouth daily. 10/23/23   Marcelino Duster, MD  methimazole (TAPAZOLE) 5 MG tablet Take 1 tablet (5 mg total) by mouth daily. 10/23/23 11/22/23  Marcelino Duster, MD  Multiple Vitamin (MULTIVITAMIN WITH MINERALS) TABS tablet Take 1 tablet by mouth daily. 10/23/23 11/22/23  Marcelino Duster, MD  nicotine (NICODERM CQ - DOSED IN MG/24 HOURS) 14 mg/24hr patch Place 1 patch (14 mg total) onto the skin daily. 10/23/23   Marcelino Duster, MD  nitroGLYCERIN (NITROSTAT) 0.4 MG SL tablet Place 0.4 mg under the tongue every 5 (five) minutes as needed for chest pain.    [provider]  pantoprazole (PROTONIX) 40 MG tablet Take 1 tablet (40 mg total) by mouth daily. 10/23/23 11/22/23  Marcelino Duster, MD    Physical Exam: Vitals:   10/31/23 4696 10/31/23 0636 10/31/23 0637 10/31/23 0645  BP: (!) 170/91  (!) 170/91   Pulse: 78 76 78 73  Resp:   15   Temp:   98.1 F (36.7 C)   TempSrc:   Oral   SpO2: 100% 100% 100% 99%  Weight:      Height:       Physical Exam Constitutional:      Appearance: She is normal weight.     Comments: + lethargy at the bedside   HENT:     Head: Normocephalic and atraumatic.     Mouth/Throat:     Mouth: Mucous membranes are moist.  Eyes:     Pupils: Pupils are equal, round, and reactive to light.  Cardiovascular:     Rate and Rhythm: Normal rate and regular rhythm.  Pulmonary:     Effort: Pulmonary effort is normal.  Musculoskeletal:     Comments: + LUE swelling  + generalized weakness    Neurological:     Comments: + lethargy at the bedside   Psychiatric:        Mood and Affect: Mood normal.     Data Reviewed:  There are no new results to review at this time.  Lab Results  Component Value Date   WBC 6.8 10/31/2023   HGB 8.9 (L) 10/31/2023   HCT 27.3 (L) 10/31/2023   MCV 90.4 10/31/2023   PLT 178 10/31/2023   Last metabolic  panel Lab Results  Component Value Date   GLUCOSE 95 10/31/2023   NA 130 (L) 10/31/2023   K 3.7 10/31/2023   CL 102 10/31/2023   CO2 16 (L) 10/31/2023   BUN 26 (H) 10/31/2023   CREATININE 1.10 (H) 10/31/2023   GFRNONAA 54 (L) 10/31/2023   CALCIUM 8.7 (L) 10/31/2023   PHOS 2.7 10/20/2023   PROT 7.3 10/31/2023   ALBUMIN 3.5 10/31/2023   BILITOT 0.7 10/31/2023   ALKPHOS 64 10/31/2023   AST 17 10/31/2023   ALT 16 10/31/2023   ANIONGAP 12 10/31/2023    Assessment and Plan: Seizure disorder (HCC) Encephalopathy  Recurrent breakthough seizure w/ concern for medication noncompliance  Lethargic at the bedside at present Keppra and vimpat levels pending  S/p IV keppra and vimpat  CT head WNL  Prn IV ativan  Resume home regimen as mentation improves  Monitor  Neurology consultation as appropriate   Stage 3b chronic kidney disease (HCC) Cr 1.1 w/ GFR in 50s At baseline  Monitor   Atrial fibrillation (HCC) Rate controlled at present  Not on [facility]  Cont home regimen including asa and coreg as mentation improves    Chronic diastolic CHF (congestive heart failure) (HCC) 2D ECHO 10/2023 w/ EF 50-55% and grade 2 DD  Appears euvolemic  Will monitor    Coronary artery disease No active CP  Monitor   Hyperthyroidism Cont tapazole    Hypertension BP stable  Titrate home regimen    Left upper extremity swelling Noted LUE swelling on presentation- appears to be recurring issue assd w/ seizure events  Suspect prior mastectomy and secondary lymphedema/lymphatic obstruction likely confounding issue  Will check LUE u/s x 1  Will otherwise monitor closely  Hyponatremia Na 130 today which appears to be baseline in review of labs  Will otherwise monitor       Advance Care Planning:   Code Status: Full Code   Consults: None   Family Communication: No family at the bedside   Severity of Illness: The appropriate patient status for this patient is OBSERVATION.  Observation status is judged to be reasonable and necessary in order to provide the required intensity of service to ensure the patient's safety. The patient's presenting symptoms, physical exam findings, and initial radiographic and laboratory data in the context of their medical condition is felt to place them at decreased risk for further clinical deterioration. Furthermore, it is anticipated that the patient will be medically stable for discharge from the hospital within 2 midnights of admission.   Author: Floydene Flock, MD 10/31/2023 8:33 AM  For on call review [email or web address].

## 2023-10-31 NOTE — ED Notes (Signed)
 Helped pt walk to toilet.

## 2023-10-31 NOTE — ED Notes (Signed)
 IV is out--pt unsure how it came out.

## 2023-10-31 NOTE — ED Triage Notes (Addendum)
 Pt BIB [facility] for possible seizure, stated she was awake and felt shaking in her left arm that moved through her whole body,  was conscious the entire duration of the seizure, patient has left arm edema post mastectomy in left arm 6 months ago

## 2023-10-31 NOTE — ED Notes (Signed)
Pt reconnected to monitor.

## 2023-10-31 NOTE — Assessment & Plan Note (Signed)
 Hyponatremia.  Renal function with serum cr at 1.0 with K at 3,9 and serum bicarbonate at 19  Na 130   Plan to continue close monitoring renal function and electrolytes.  She is tolerating po well.

## 2023-10-31 NOTE — Assessment & Plan Note (Signed)
Hyperthyroidism  Continue with methimazole.  

## 2023-10-31 NOTE — Assessment & Plan Note (Signed)
 Continue blood pressure control with losartan and carvedilol.

## 2023-10-31 NOTE — Assessment & Plan Note (Deleted)
 Noted LUE swelling on presentation- appears to be recurring issue assd w/ seizure events  Suspect prior mastectomy and secondary lymphedema/lymphatic obstruction likely confounding issue  Will check LUE u/s x 1  Will otherwise monitor closely

## 2023-10-31 NOTE — ED Notes (Signed)
 CCMD called and cardiac monitoring verified.

## 2023-10-31 NOTE — Assessment & Plan Note (Signed)
 Patient with no signs of heart failure exacerbation.  Continue blood pressure monitoring  She has chronic lymphedema lower extremities and apparently also left upper extremity. (US doppler left upper extremity negative for DVT).   Continue medical therapy with losartan, carvedilol and SGLT 2 inh.

## 2023-10-31 NOTE — Assessment & Plan Note (Addendum)
 Encephalopathy  Recurrent breakthough seizure w/ concern for medication noncompliance  Lethargic at the bedside at present Keppra and vimpat levels pending  S/p IV keppra and vimpat  CT head WNL  Prn IV ativan  Resume home regimen as mentation improves  Monitor  Neurology consultation as appropriate

## 2023-10-31 NOTE — ED Notes (Addendum)
 Delayed on administering Atorvastatin, Lacosamide, Levetiracetam, and Methimazole due to being unable to pull the medication as a Paramedic.

## 2023-10-31 NOTE — Assessment & Plan Note (Signed)
 No chest pain or angina.  No acute coronary syndrome

## 2023-10-31 NOTE — Assessment & Plan Note (Signed)
 Rate controlled at present  Not on [facility]  Cont home regimen including asa and coreg as mentation improves

## 2023-11-01 DIAGNOSIS — G40909 Epilepsy, unspecified, not intractable, without status epilepticus: Secondary | ICD-10-CM | POA: Diagnosis not present

## 2023-11-01 DIAGNOSIS — N1832 Chronic kidney disease, stage 3b: Secondary | ICD-10-CM | POA: Diagnosis not present

## 2023-11-01 DIAGNOSIS — I2583 Coronary atherosclerosis due to lipid rich plaque: Secondary | ICD-10-CM

## 2023-11-01 DIAGNOSIS — I5032 Chronic diastolic (congestive) heart failure: Secondary | ICD-10-CM | POA: Diagnosis not present

## 2023-11-01 DIAGNOSIS — I4891 Unspecified atrial fibrillation: Secondary | ICD-10-CM | POA: Diagnosis not present

## 2023-11-01 DIAGNOSIS — E0590 Thyrotoxicosis, unspecified without thyrotoxic crisis or storm: Secondary | ICD-10-CM

## 2023-11-01 DIAGNOSIS — I10 Essential (primary) hypertension: Secondary | ICD-10-CM

## 2023-11-01 DIAGNOSIS — D649 Anemia, unspecified: Secondary | ICD-10-CM

## 2023-11-01 LAB — CBC
HCT: 27.2 % — ABNORMAL LOW (ref 36.0–46.0)
Hemoglobin: 9.1 g/dL — ABNORMAL LOW (ref 12.0–15.0)
MCH: 29.5 pg (ref 26.0–34.0)
MCHC: 33.5 g/dL (ref 30.0–36.0)
MCV: 88.3 fL (ref 80.0–100.0)
Platelets: 173 10*3/uL (ref 150–400)
RBC: 3.08 MIL/uL — ABNORMAL LOW (ref 3.87–5.11)
RDW: 15.9 % — ABNORMAL HIGH (ref 11.5–15.5)
WBC: 5.7 10*3/uL (ref 4.0–10.5)
nRBC: 0 % (ref 0.0–0.2)

## 2023-11-01 LAB — COMPREHENSIVE METABOLIC PANEL
ALT: 14 U/L (ref 0–44)
AST: 19 U/L (ref 15–41)
Albumin: 3.3 g/dL — ABNORMAL LOW (ref 3.5–5.0)
Alkaline Phosphatase: 67 U/L (ref 38–126)
Anion gap: 9 (ref 5–15)
BUN: 23 mg/dL (ref 8–23)
CO2: 19 mmol/L — ABNORMAL LOW (ref 22–32)
Calcium: 8.8 mg/dL — ABNORMAL LOW (ref 8.9–10.3)
Chloride: 102 mmol/L (ref 98–111)
Creatinine, Ser: 1.07 mg/dL — ABNORMAL HIGH (ref 0.44–1.00)
GFR, Estimated: 56 mL/min — ABNORMAL LOW (ref >60)
Glucose, Bld: 142 mg/dL — ABNORMAL HIGH (ref 70–99)
Potassium: 3.9 mmol/L (ref 3.5–5.1)
Sodium: 130 mmol/L — ABNORMAL LOW (ref 135–145)
Total Bilirubin: 0.6 mg/dL (ref 0.0–1.2)
Total Protein: 7 g/dL (ref 6.5–8.1)

## 2023-11-01 MED ORDER — ACETAMINOPHEN 325 MG PO TABS
650.0000 mg | ORAL_TABLET | Freq: Four times a day (QID) | ORAL | Status: DC | PRN
  Administered 2023-11-01 – 2023-11-02 (×2): 650 mg via ORAL
  Filled 2023-11-01 (×2): qty 2

## 2023-11-01 MED ORDER — LOSARTAN POTASSIUM 50 MG PO TABS
100.0000 mg | ORAL_TABLET | Freq: Every day | ORAL | Status: DC
  Administered 2023-11-01 – 2023-11-02 (×2): 100 mg via ORAL
  Filled 2023-11-01 (×2): qty 2

## 2023-11-01 MED ORDER — EMPAGLIFLOZIN 10 MG PO TABS
10.0000 mg | ORAL_TABLET | Freq: Every day | ORAL | Status: DC
  Administered 2023-11-01 – 2023-11-02 (×2): 10 mg via ORAL
  Filled 2023-11-01 (×2): qty 1

## 2023-11-01 MED ORDER — CARVEDILOL 6.25 MG PO TABS
6.2500 mg | ORAL_TABLET | Freq: Two times a day (BID) | ORAL | Status: DC
  Administered 2023-11-01 – 2023-11-02 (×2): 6.25 mg via ORAL
  Filled 2023-11-01 (×2): qty 1

## 2023-11-01 MED ORDER — NICOTINE 14 MG/24HR TD PT24
14.0000 mg | MEDICATED_PATCH | Freq: Every day | TRANSDERMAL | Status: DC
  Filled 2023-11-01 (×2): qty 1

## 2023-11-01 MED ORDER — ENSURE ENLIVE PO LIQD
237.0000 mL | Freq: Two times a day (BID) | ORAL | Status: DC
  Administered 2023-11-01 – 2023-11-02 (×2): 237 mL via ORAL

## 2023-11-01 MED ORDER — GABAPENTIN 300 MG PO CAPS
300.0000 mg | ORAL_CAPSULE | Freq: Two times a day (BID) | ORAL | Status: DC
  Administered 2023-11-01 – 2023-11-02 (×2): 300 mg via ORAL
  Filled 2023-11-01 (×2): qty 1

## 2023-11-01 MED ORDER — ADULT MULTIVITAMIN W/MINERALS CH
1.0000 | ORAL_TABLET | Freq: Every day | ORAL | Status: DC
  Administered 2023-11-01 – 2023-11-02 (×2): 1 via ORAL
  Filled 2023-11-01 (×2): qty 1

## 2023-11-01 MED ORDER — VITAMIN B-12 1000 MCG PO TABS
1000.0000 ug | ORAL_TABLET | Freq: Every day | ORAL | Status: DC
  Administered 2023-11-01 – 2023-11-02 (×2): 1000 ug via ORAL
  Filled 2023-11-01 (×2): qty 1

## 2023-11-01 NOTE — Progress Notes (Signed)
 Progress Note   Patient: Anne Phillips ZOX:[id number] DOB: 11-03-1950 DOA: 10/31/2023     0 DOS: the patient was seen and examined on 11/01/2023   Brief hospital course: Anne Phillips was admitted to the hospital with the working diagnosis of seizures.   73 yo female with the past medical  history of seizures, hypertension, hyperlipidemia, coronary artery disease, heart failure, chronic renal disease, atrial fibrillation, hyperthyroidism and breast cancer who presented with neurologic deficit. 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 She was hospitalized on 02/20 to 10/23/23 from uncontrolled seizures and was determined to be due to poor compliance to her antiepileptic regimen.  On her initial physical examination her blood pressure was 170/91, HR 78, RR 15 and 02 saturation 99%.  Patient was lethargic but responsive, positive generalized weakness, lungs with no wheezing or rales, heart with S1 and S2 present and regular with no gallops, rubs or murmurs, abdomen with no distention and no lower extremity edema. Positive left upper extremity edema.   Patient has been placed on her anti-seizure medications.   Assessment and Plan: * Seizure disorder (HCC) Acute metabolic encephalopathy, clinically improving.   Patient had no further seizures since admission. At home patient is not sure if taking all medications as prescribed.   Plan to continue medical therapy with keppra and lacosamide.  As needed lorazepam. Continue neuro checks per unit protocol.    Atrial fibrillation (HCC) Currently rate control.  She is not on anticoagulation at home Continue telemetry monitoring   Stage 3b chronic kidney disease (HCC) Hyponatremia.  Renal function with serum cr at 1.0 with K at 3,9 and serum bicarbonate at 19  Na 130   Plan to continue close monitoring renal function and electrolytes.  She is tolerating po well.   Chronic diastolic CHF (congestive  heart failure) (HCC) Patient with no signs of heart failure exacerbation.  Continue blood pressure monitoring  She has chronic lymphedema lower extremities and apparently also left upper extremity. (US doppler left upper extremity negative for DVT).   Continue medical therapy with losartan, carvedilol and SGLT 2 inh.   Hypertension Continue blood pressure control with losartan and carvedilol.   Coronary artery disease No chest pain or angina.  No acute coronary syndrome   Hyperthyroidism Continue with methimazole.    Chronic anemia Cell count has been stable with hgb at 9,1        Subjective: Patient is feeling better, today is more awake and reactive, no chest pain, no dyspnea.,   Physical Exam: Vitals:   10/31/23 2221 11/01/23 0028 11/01/23 0610 11/01/23 0857  BP: (!) 182/94 (!) 186/94 (!) 147/69 (!) 163/86  Pulse: 80 84 90 95  Resp: 16 18 16 20   Temp: 98.2 F (36.8 C) 98.6 F (37 C) 98.9 F (37.2 C) 98.7 F (37.1 C)  TempSrc:  Oral    SpO2: 100% 99% 100% 100%  Weight:      Height:       Neurology awake and alert, deconditioned  ENT with mild pallor  Cardiovascular with S1 and S2 present and regular with no gallops, rubs or murmurs Respiratory with no rales or wheezing, no rhonchi Abdomen with no distention  Data Reviewed:    Family Communication: no family at the bedside   Disposition: Status is: Observation The patient remains OBS appropriate and will d/c before 2 midnights.  Planned Discharge Destination: Home    Author: Coralie Keens, MD 11/01/2023 1:46 PM  For on  call review [email or web address].

## 2023-11-01 NOTE — Evaluation (Signed)
 Physical Therapy Evaluation Patient Details Name: Anne Phillips MRN: [id number] DOB: 1950/10/05 Today's Date: 11/01/2023  History of Present Illness  presented to ER secondary to recurrent, breakthrough seizure activity; admitted for management of seizure activity, encephalopathy  Clinical Impression  Patient resting in bed upon arrival to room; alert and oriented to self, location and general situation.  Follows commands, slightly impulsive at times.  Denies acute pain; does endorse (and demonstrate) baseline residual weakness to R LE and marked edema (?lymphadema?) to L UE. Currently completes bed mobility with close sup; sit/stand, basic transfers and gait (20') with RW, cga/close sup.  Demonstrates step to gait pattern, decreased bilat LE step height/length/foot clerance (R > L), maintains R LE in PF (unable to achieve foot flat) throughout gait cycle.  Slow and deliberate, but no overt buckling or LOB. Would benefit from skilled PT to address above deficits and promote optimal return to PLOF.; recommend post-acute PT follow up as indicated by interdisciplinary care team.             If plan is discharge home, recommend the following: A little help with walking and/or transfers;Help with stairs or ramp for entrance;A little help with bathing/dressing/bathroom;Assistance with cooking/housework;Assist for transportation   Can travel by private vehicle        Equipment Recommendations    Recommendations for Other Services       Functional Status Assessment Patient has had a recent decline in their functional status and demonstrates the ability to make significant improvements in function in a reasonable and predictable amount of time.     Precautions / Restrictions Precautions Precautions: Fall Restrictions Weight Bearing Restrictions Per Provider Order: No      Mobility  Bed Mobility Overal bed mobility: Needs Assistance Bed Mobility: Supine to Sit     Supine to sit:  Supervision          Transfers Overall transfer level: Needs assistance Equipment used: Rolling walker (2 wheels) Transfers: Sit to/from Stand Sit to Stand: Contact guard assist           General transfer comment: does rely on UE support for safety/stability    Ambulation/Gait Ambulation/Gait assistance: Supervision Gait Distance (Feet): 25 Feet Assistive device: Rolling walker (2 wheels)         General Gait Details: step to gait pattern, decreased bilat LE step height/length/foot clerance (R > L), maintains R LE in PF (unable to achieve foot flat) throughout gait cycle.  Slow and deliberate, but no overt buckling or LOB.  Stairs            Wheelchair Mobility     Tilt Bed    Modified Rankin (Stroke Patients Only)       Balance Overall balance assessment: Needs assistance Sitting-balance support: No upper extremity supported, Feet supported Sitting balance-Leahy Scale: Good     Standing balance support: Bilateral upper extremity supported Standing balance-Leahy Scale: Fair                               Pertinent Vitals/Pain Pain Assessment Pain Assessment: No/denies pain    Home Living Family/patient expects to be discharged to:: Private residence Living Arrangements: Other relatives Available Help at Discharge: Family;Available PRN/intermittently Type of Home: House Home Access: Stairs to enter Entrance Stairs-Rails: Right;Left;Can reach both Entrance Stairs-Number of Steps: 2   Home Layout: One level Home Equipment: Agricultural consultant (2 wheels);Wheelchair - manual Additional Comments: lives with her brother who  does not provide assistance to patient per patient report    Prior Function Prior Level of Function : Needs assist;History of Falls (last six months)             Mobility Comments: Pt stated that she primarily uses a wheelchair at home but is able to amb limited household distances, several falls in the last 6  months ADLs Comments: Ind with ADLs     Extremity/Trunk Assessment   Upper Extremity Assessment Upper Extremity Assessment: Generalized weakness (grossly 3-/5 throughout)    Lower Extremity Assessment Lower Extremity Assessment: Generalized weakness (grossly 3-/5 throughout LEs, 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  Maintains R LE in PF throughout gait cycle (unable to achieve foot flat))       Communication   Communication Communication: No apparent difficulties    Cognition Arousal: Alert Behavior During Therapy: WFL for tasks assessed/performed   PT - Cognitive impairments: No apparent impairments                                 Cueing Cueing Techniques: Verbal cues, Visual cues, Tactile cues     General Comments      Exercises Other Exercises Other Exercises: Toilet transfer, SPT without assist device, cga/min assist; does require UE support on armrests of BSC for external stabilization   Assessment/Plan    PT Assessment Patient needs continued PT services  PT Problem List Decreased strength;Decreased activity tolerance;Decreased balance;Decreased mobility;Decreased knowledge of use of DME       PT Treatment Interventions DME instruction;Gait training;Stair training;Functional mobility training;Therapeutic activities;Therapeutic exercise;Balance training;Patient/family education    PT Goals (Current goals can be found in the Care Plan section)  Acute Rehab PT Goals Patient Stated Goal: to get out of the bed PT Goal Formulation: With patient Time For Goal Achievement: 11/15/23 Potential to Achieve Goals: Good    Frequency Min 1X/week     Co-evaluation               AM-PAC PT "6 Clicks" Mobility  Outcome Measure Help needed turning from your back to your side while in a flat bed without using bedrails?: None Help needed moving from lying on your back to sitting on the side of a flat bed without using bedrails?: None Help needed  moving to and from a bed to a chair (including a wheelchair)?: A Little Help needed standing up from a chair using your arms (e.g., wheelchair or bedside chair)?: A Little Help needed to walk in hospital room?: A Little Help needed climbing 3-5 steps with a railing? : A Little 6 Click Score: 20    End of Session Equipment Utilized During Treatment: Gait belt Activity Tolerance: Patient tolerated treatment well Patient left: in chair;with call bell/phone within reach;with chair alarm set Nurse Communication: Mobility status PT Visit Diagnosis: History of falling (Z91.81);Difficulty in walking, not elsewhere classified (R26.2);Muscle weakness (generalized) (M62.81)    Time: 7829-5621 PT Time Calculation (min) (ACUTE ONLY): 16 min   Charges:   PT Evaluation $PT Eval Moderate Complexity: 1 Mod   PT General Charges $$ ACUTE PT VISIT: 1 Visit        Conny Moening H. Manson Passey, PT, DPT, NCS 11/01/23, 10:49 AM [phone]

## 2023-11-01 NOTE — Progress Notes (Signed)
 OT Cancellation Note  Patient Details Name: Anne Phillips MRN: [id number] DOB: [birth year]   Cancelled Treatment:    Reason Eval/Treat Not Completed: Other (comment). Consult received, chart reviewed. Upon attempt this afternoon, pt transferring rooms, unavailable for evaluation. Will re-attempt next date.   Arman Filter., MPH, MS, OTR/L ascom [phone] 11/01/23, 4:08 PM   [name] 11/01/2023, 4:07 PM

## 2023-11-01 NOTE — Assessment & Plan Note (Signed)
 Cell count has been stable with hgb at 9,1

## 2023-11-01 NOTE — Hospital Course (Addendum)
 Anne Phillips was admitted to the hospital with the working diagnosis of seizures.   73 yo female with the past medical  history of seizures, hypertension, hyperlipidemia, coronary artery disease, heart failure, chronic renal disease, atrial fibrillation, hyperthyroidism and breast cancer who presented with neurologic deficit. 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 She was hospitalized on 02/20 to 10/23/23 from uncontrolled seizures and was determined to be due to poor compliance to her antiepileptic regimen.  On her initial physical examination her blood pressure was 170/91, HR 78, RR 15 and 02 saturation 99%.  Patient was lethargic but responsive, positive generalized weakness, lungs with no wheezing or rales, heart with S1 and S2 present and regular with no gallops, rubs or murmurs, abdomen with no distention and no lower extremity edema. Positive left upper extremity edema.   Patient has been placed on her anti-seizure medications.

## 2023-11-02 ENCOUNTER — Other Ambulatory Visit: Payer: Self-pay

## 2023-11-02 DIAGNOSIS — G40219 Localization-related (focal) (partial) symptomatic epilepsy and epileptic syndromes with complex partial seizures, intractable, without status epilepticus: Principal | ICD-10-CM

## 2023-11-02 DIAGNOSIS — E871 Hypo-osmolality and hyponatremia: Secondary | ICD-10-CM | POA: Diagnosis not present

## 2023-11-02 DIAGNOSIS — Z91148 Patient's other noncompliance with medication regimen for other reason: Secondary | ICD-10-CM | POA: Diagnosis not present

## 2023-11-02 DIAGNOSIS — G40909 Epilepsy, unspecified, not intractable, without status epilepticus: Secondary | ICD-10-CM | POA: Diagnosis not present

## 2023-11-02 LAB — BASIC METABOLIC PANEL
Anion gap: 8 (ref 5–15)
BUN: 24 mg/dL — ABNORMAL HIGH (ref 8–23)
CO2: 21 mmol/L — ABNORMAL LOW (ref 22–32)
Calcium: 9 mg/dL (ref 8.9–10.3)
Chloride: 101 mmol/L (ref 98–111)
Creatinine, Ser: 1.04 mg/dL — ABNORMAL HIGH (ref 0.44–1.00)
GFR, Estimated: 57 mL/min — ABNORMAL LOW (ref >60)
Glucose, Bld: 85 mg/dL (ref 70–99)
Potassium: 4.2 mmol/L (ref 3.5–5.1)
Sodium: 130 mmol/L — ABNORMAL LOW (ref 135–145)

## 2023-11-02 LAB — LEVETIRACETAM LEVEL: Levetiracetam Lvl: 54.9 ug/mL — ABNORMAL HIGH (ref 10.0–40.0)

## 2023-11-02 MED ORDER — LOSARTAN 100 MG TABLET
ORAL_TABLET | Freq: Every day | ORAL | 3 refills | 90.00 days
Start: 2023-11-02 — End: ?

## 2023-11-02 MED ORDER — LEVETIRACETAM 500 MG TABLET
ORAL_TABLET | 3 refills | 0.00 days | Status: CP
Start: 2023-11-02 — End: ?

## 2023-11-02 NOTE — TOC Transition Note (Signed)
 Transition of Care [facility]) - Discharge Note   Patient Details  Name: Anne Phillips MRN: [id number] Date of Birth: 04-08-1951  [facility] CM/SW Contact:  Erin Sons, LCSW Phone Number: 11/02/2023, 12:11 PM   Clinical Narrative:     CSW met with pt to discuss HH. She is agreeable to CSW arranging HH and has no preference in agency. She confirms address listed in chart. RN spoke with pt's sister who states pt will need EMS transport home. Pt agreeable to EMS transport. Pt states she has a walker at home and denies any other DME needs.   HH arranged with [facility].   CSW scheduled non-emergency transport for next available. RN made aware.    Final next level of care: Home w Home Health Services Barriers to Discharge: No Barriers Identified    Discharge Placement                Patient to be transferred to facility by: [facility]   Patient and family notified of of transfer: 11/02/23  Discharge Plan and Services Additional resources added to the After Visit Summary for                              [facility] Agency: [facility] Date [facility] Agency Contacted: 11/02/23 Time HH Agency Contacted: 1211 Representative spoke with at [facility] Agency: Shanda Bumps  Social Drivers of Health (SDOH) Interventions SDOH Screenings   Food Insecurity: No Food Insecurity (11/01/2023)  Housing: Low Risk  (11/01/2023)  Transportation Needs: No Transportation Needs (11/01/2023)  Utilities: Not At Risk (11/01/2023)  Financial Resource Strain: Low Risk  (07/08/2022)   Received from [facility], [facility]  Physical Activity: Insufficiently Active (11/20/2020)   Received from [facility], [facility]  Social Connections: Unknown (11/01/2023)  Stress: No Stress Concern Present (01/20/2022)   Received from [facility], [facility]  Tobacco Use: High Risk (10/31/2023)  Health Literacy: Low Risk  (01/21/2023)   [facility]     Readmission Risk Interventions     10/12/2022   11:51 AM  Readmission Risk Prevention Plan  PCP or Specialist Appt within 3-5 Days Complete  Social Work Consult for Recovery Care Planning/Counseling Complete  Palliative Care Screening Not Applicable

## 2023-11-02 NOTE — Evaluation (Signed)
 Occupational Therapy Evaluation Patient Details Name: Anne Phillips MRN: [id number] DOB: 1951-05-16 Today's Date: 11/02/2023   History of Present Illness   presented to ER secondary to recurrent, breakthrough seizure activity; admitted for management of seizure activity, encephalopathy     Clinical Impressions Pt seen for OT evaluation this date. Pt endorses RLE>LLE pain, RN notified. Pt agreeable to session, alert and oriented. No direct assist required for bed mobility and handheld assist CGA for standing and taking a few small steps to [facility] at sink to complete seated grooming tasks. Pt tolerated well, eager to discharge home. Pt will benefit from skilled OT services to maximize safety/indep.      If plan is discharge home, recommend the following:   A little help with walking and/or transfers;A little help with bathing/dressing/bathroom;Assistance with cooking/housework;Assist for transportation;Help with stairs or ramp for entrance     Functional Status Assessment   Patient has had a recent decline in their functional status and demonstrates the ability to make significant improvements in function in a reasonable and predictable amount of time.     Equipment Recommendations   None recommended by OT     Recommendations for Other Services         Precautions/Restrictions   Precautions Precautions: Fall Recall of Precautions/Restrictions: Intact Restrictions Weight Bearing Restrictions Per Provider Order: No     Mobility Bed Mobility Overal bed mobility: Needs Assistance Bed Mobility: Supine to Sit, Sit to Supine     Supine to sit: Supervision Sit to supine: Supervision        Transfers Overall transfer level: Needs assistance Equipment used: 1 person hand held assist Transfers: Sit to/from Stand Sit to Stand: Contact guard assist                  Balance Overall balance assessment: Needs assistance Sitting-balance support: No upper  extremity supported, Feet supported Sitting balance-Leahy Scale: Good     Standing balance support: Single extremity supported Standing balance-Leahy Scale: Fair                             ADL either performed or assessed with clinical judgement   ADL Overall ADL's : Needs assistance/impaired     Grooming: Wash/dry face;Wash/dry hands;Oral care;Sitting;Set up;Supervision/safety                                       Vision         Perception         Praxis         Pertinent Vitals/Pain Pain Assessment Pain Assessment: Faces Faces Pain Scale: Hurts even more Pain Location: RLE>LLE Pain Descriptors / Indicators: Aching, Grimacing, Guarding Pain Intervention(s): Limited activity within patient's tolerance, Monitored during session, Repositioned, Patient requesting pain meds-RN notified, RN gave pain meds during session     Extremity/Trunk Assessment Upper Extremity Assessment Upper Extremity Assessment: Generalized weakness;LUE deficits/detail LUE Deficits / Details: LUE edematous   Lower Extremity Assessment Lower Extremity Assessment: Generalized weakness (grossly 3-/5 throughout LEs, 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 Maintains R LE in PF throughout gait cycle (unable to achieve foot flat))       Communication     Cognition Arousal: Alert Behavior During Therapy: [facility] for tasks assessed/performed Cognition: No apparent impairments  Cueing  General Comments          Exercises     Shoulder Instructions      Home Living Family/patient expects to be discharged to:: Private residence Living Arrangements: Other relatives Available Help at Discharge: Family;Available PRN/intermittently Type of Home: House Home Access: Stairs to enter [employer] of Steps: 2 Entrance Stairs-Rails: Right;Left;Can reach both Home Layout: One level                Home Equipment: Agricultural consultant (2 wheels);Wheelchair - manual   Additional Comments: lives with her brother who does not provide assistance to patient per patient report      Prior Functioning/Environment Prior Level of Function : Needs assist;History of Falls (last six months)             Mobility Comments: Pt stated that she primarily uses a wheelchair at home but is able to amb limited household distances, several falls in the last 6 months ADLs Comments: Ind with ADLs    OT Problem List: Decreased strength;Decreased activity tolerance;Impaired balance (sitting and/or standing);Impaired UE functional use   OT Treatment/Interventions: Self-care/ADL training;Therapeutic exercise;Therapeutic activities;DME and/or AE instruction;Patient/family education;Energy conservation;Balance training      OT Goals(Current goals can be found in the care plan section)   Acute Rehab OT Goals Patient Stated Goal: go home OT Goal Formulation: With patient Time For Goal Achievement: 11/16/23 Potential to Achieve Goals: Good ADL Goals Pt Will Perform Lower Body Dressing: with modified independence Pt Will Transfer to Toilet: with modified independence (LRAD) Pt Will Perform Toileting - Clothing Manipulation and hygiene: with modified independence Additional ADL Goal #1: Pt will complete all aspects of bathign primarily from seated positionw ith PRN assist, 1/1 opportunity.   OT Frequency:  Min 1X/week    Co-evaluation              AM-PAC OT "6 Clicks" Daily Activity     Outcome Measure Help from another person eating meals?: None Help from another person taking care of personal grooming?: A Little Help from another person toileting, which includes using toliet, bedpan, or urinal?: A Little Help from another person bathing (including washing, rinsing, drying)?: A Little Help from another person to put on and taking off regular upper body clothing?: None Help from another person to  put on and taking off regular lower body clothing?: A Little 6 Click Score: 20   End of Session Nurse Communication: Patient requests pain meds  Activity Tolerance: Patient tolerated treatment well Patient left: in bed;with bed alarm set;with call bell/phone within reach  OT Visit Diagnosis: Muscle weakness (generalized) (M62.81)                Time: 1129-1140 OT Time Calculation (min): 11 min Charges:  OT General Charges $OT Visit: 1 Visit OT Evaluation $OT Eval Low Complexity: 1 Low  Arman Filter., MPH, MS, OTR/L ascom [phone] 11/02/23, 1:17 PM

## 2023-11-02 NOTE — Care Management Obs Status (Signed)
 MEDICARE OBSERVATION STATUS NOTIFICATION   Patient Details  Name: KYTZIA Phillips MRN: [id number] Date of Birth: 11-14-1950   Medicare Observation Status Notification Given:  Orland Dec, CMA 11/02/2023, 9:20 AM

## 2023-11-02 NOTE — Discharge Summary (Signed)
 Physician Discharge Summary   Patient: Anne Phillips MRN: [id number] DOB: 1951/01/07  Admit date:     10/31/2023  Discharge date: {dischdate:26783}  Discharge Physician: Marcelino Duster   PCP: Gillis Santa, MD   Recommendations at discharge:  {Tip this will not be part of the note when signed- Example include specific recommendations for outpatient follow-up, pending tests to follow-up on. (Optional):26781}  ***  Discharge Diagnoses: Principal Problem:   Seizure disorder (HCC) Active Problems:   Atrial fibrillation (HCC)   Stage 3b chronic kidney disease (HCC)   Chronic diastolic CHF (congestive heart failure) (HCC)   Hypertension   Coronary artery disease   Hyperthyroidism   Chronic anemia  Resolved Problems:   * No resolved hospital problems. [facility] Course: Anne Phillips was admitted to the hospital with the working diagnosis of seizures.   73 yo female with the past medical  history of seizures, hypertension, hyperlipidemia, coronary artery disease, heart failure, chronic renal disease, atrial fibrillation, hyperthyroidism and breast cancer who presented with neurologic deficit. 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 She was hospitalized on 02/20 to 10/23/23 from uncontrolled seizures and was determined to be due to poor compliance to her antiepileptic regimen.  On her initial physical examination her blood pressure was 170/91, HR 78, RR 15 and 02 saturation 99%.  Patient was lethargic but responsive, positive generalized weakness, lungs with no wheezing or rales, heart with S1 and S2 present and regular with no gallops, rubs or murmurs, abdomen with no distention and no lower extremity edema. Positive left upper extremity edema.   Patient has been placed on her anti-seizure medications.   Assessment and Plan: * Seizure disorder (HCC) Acute metabolic encephalopathy, clinically improving.   Patient had no further  seizures since admission. At home patient is not sure if taking all medications as prescribed.   Plan to continue medical therapy with keppra and lacosamide.  As needed lorazepam. Continue neuro checks per unit protocol.    Atrial fibrillation (HCC) Currently rate control.  She is not on anticoagulation at home Continue telemetry monitoring   Stage 3b chronic kidney disease (HCC) Hyponatremia.  Renal function with serum cr at 1.0 with K at 3,9 and serum bicarbonate at 19  Na 130   Plan to continue close monitoring renal function and electrolytes.  She is tolerating po well.   Chronic diastolic CHF (congestive heart failure) (HCC) Patient with no signs of heart failure exacerbation.  Continue blood pressure monitoring  She has chronic lymphedema lower extremities and apparently also left upper extremity. (US doppler left upper extremity negative for DVT).   Continue medical therapy with losartan, carvedilol and SGLT 2 inh.   Hypertension Continue blood pressure control with losartan and carvedilol.   Coronary artery disease No chest pain or angina.  No acute coronary syndrome   Hyperthyroidism Continue with methimazole.    Chronic anemia Cell count has been stable with hgb at 9,1       {Tip this will not be part of the note when signed Body mass index is 23.42 kg/m. , ,  (Optional):26781}  {(NOTE) Pain control PDMP Statment (Optional):26782} Consultants: *** Procedures performed: ***  Disposition: {Plan; Disposition:26390} Diet recommendation:  Discharge Diet Orders (From admission, onward)     Start     Ordered   11/02/23 0000  Diet - low sodium heart healthy        11/02/23 1042           {  Diet_Plan:26776} DISCHARGE MEDICATION: Allergies as of 11/02/2023       Reactions   Iodinated Contrast Media Hives, Other (See Comments)   Rash Other Reaction(s): Other (See Comments) Rash   Rash  Rash    Rash  Rash   Other Hives    Metal-reaction=rash Metal-reaction=rash        Medication List     TAKE these medications    acetaminophen 325 MG tablet Commonly known as: TYLENOL Take 2 tablets (650 mg total) by mouth every 6 (six) hours as needed for mild pain (or Fever >/= 101).   ammonium lactate 12 % cream Commonly known as: AMLACTIN Apply 1 Application topically 2 (two) times daily.   aspirin EC 81 MG tablet Take 1 tablet (81 mg total) by mouth daily. Swallow whole.   atorvastatin 40 MG tablet Commonly known as: LIPITOR Take 1 tablet (40 mg total) by mouth daily.   calcium carbonate 500 MG chewable tablet Commonly known as: Calcium Antacid Chew 1 tablet (200 mg of elemental calcium total) by mouth 2 (two) times daily with a meal.   carvedilol 6.25 MG tablet Commonly known as: COREG Take 1 tablet (6.25 mg total) by mouth 2 (two) times daily.   cyanocobalamin 1000 MCG tablet Take 1 tablet (1,000 mcg total) by mouth daily.   empagliflozin 10 MG Tabs tablet Commonly known as: JARDIANCE Take 1 tablet (10 mg total) by mouth daily.   feeding supplement Liqd Take 237 mLs by mouth 2 (two) times daily between meals.   gabapentin 300 MG capsule Commonly known as: NEURONTIN Take 300 mg by mouth 2 (two) times daily.   Lacosamide 150 MG Tabs Take 1 tablet (150 mg total) by mouth 2 (two) times daily.   levETIRAcetam 500 MG tablet Commonly known as: KEPPRA Take 1 tablet (500 mg total) by mouth 2 (two) times daily.   losartan 100 MG tablet Commonly known as: COZAAR Take 100 mg by mouth daily.   methimazole 5 MG tablet Commonly known as: TAPAZOLE Take 1 tablet (5 mg total) by mouth daily.   multivitamin with minerals Tabs tablet Take 1 tablet by mouth daily.   nicotine 14 mg/24hr patch Commonly known as: NICODERM CQ - dosed in mg/24 hours Place 1 patch (14 mg total) onto the skin daily.   nitroGLYCERIN 0.4 MG SL tablet Commonly known as: NITROSTAT Place 0.4 mg under the tongue every 5  (five) minutes as needed for chest pain.   pantoprazole 40 MG tablet Commonly known as: Protonix Take 1 tablet (40 mg total) by mouth daily.        Follow-up Information     Gillis Santa, MD. Schedule an appointment as soon as possible for a visit.   Specialty: Family Medicine Why: Hospital follow up Contact information: [address] [place] [address] [phone]         [facility] Follow up.   Specialty: Home Health Services Why: Home Health PT/OT is arranged with [facility]. They will call to schedule an appointment. Contact information: [address] [place] [address] [phone]                Discharge Exam: Ceasar Mons Weights   10/31/23 0209  Weight: 54.4 kg   ***  Condition at discharge: {DC Condition:26389}  The results of significant diagnostics from this hospitalization (including imaging, microbiology, ancillary and laboratory) are listed below for reference.   Imaging Studies: US Venous Img Upper Uni Left (  DVT) Result Date: 10/31/2023 CLINICAL DATA:  Left upper extremity edema. History of left-sided mastectomy. EXAM: LEFT UPPER EXTREMITY VENOUS DOPPLER ULTRASOUND TECHNIQUE: Gray-scale sonography with graded compression, as well as color Doppler and duplex ultrasound were performed to evaluate the upper extremity deep venous system from the level of the subclavian vein and including the jugular, axillary, basilic, radial, ulnar and upper cephalic vein. Spectral Doppler was utilized to evaluate flow at rest and with distal augmentation maneuvers. COMPARISON:  Prior thyroid ultrasound 10/31/2022 FINDINGS: Contralateral Subclavian Vein: Respiratory phasicity is normal and symmetric with the symptomatic side. No evidence of thrombus. Normal compressibility. Internal Jugular Vein: No evidence of thrombus. Normal compressibility, respiratory phasicity and response to augmentation. Subclavian Vein: No evidence of thrombus. Normal  compressibility, respiratory phasicity and response to augmentation. Axillary Vein: No evidence of thrombus. Normal compressibility, respiratory phasicity and response to augmentation. Cephalic Vein: No evidence of thrombus. Normal compressibility, respiratory phasicity and response to augmentation. Basilic Vein: No evidence of thrombus. Normal compressibility, respiratory phasicity and response to augmentation. Brachial Veins: No evidence of thrombus. Normal compressibility, respiratory phasicity and response to augmentation. Radial Veins: No evidence of thrombus. Normal compressibility, respiratory phasicity and response to augmentation. Ulnar Veins: No evidence of thrombus. Normal compressibility, respiratory phasicity and response to augmentation. Venous Reflux:  None visualized. Other Findings: Partial imaging of left-sided thyroid nodules. This has previously been better defined by dedicated thyroid ultrasound. No specific follow-up from this exam. Subcutaneous edema is present diffusely throughout the left upper arm. IMPRESSION: No evidence of DVT within the left upper extremity. Diffuse subcutaneous edema is noted. Electronically Signed   By: Malachy Moan M.D.   On: 10/31/2023 11:51   CT Head Wo Contrast Result Date: 10/22/2023 CLINICAL DATA:  Head trauma, minor (Age >= 65y) seizure, head injury. EXAM: CT HEAD WITHOUT CONTRAST TECHNIQUE: Contiguous axial images were obtained from the base of the skull through the vertex without intravenous contrast. RADIATION DOSE REDUCTION: This exam was performed according to the departmental dose-optimization program which includes automated exposure control, adjustment of the mA and/or kV according to patient size and/or use of iterative reconstruction technique. COMPARISON:  10/17/2023 FINDINGS: Brain: No acute intracranial abnormality. Specifically, no hemorrhage, hydrocephalus, mass lesion, acute infarction, or significant intracranial injury. Old right  occipital infarct, stable. Vascular: No hyperdense vessel or unexpected calcification. Skull: No acute calvarial abnormality. Sinuses/Orbits: No acute findings Other: None IMPRESSION: No acute intracranial abnormality. Old right occipital infarct. Electronically Signed   By: Charlett Nose M.D.   On: 10/22/2023 21:12   CT ANGIO HEAD NECK W WO CM Result Date: 10/17/2023 CLINICAL DATA:  Provided history: Stroke/TIA, determine embolic source. EXAM: CT ANGIOGRAPHY HEAD AND NECK WITH AND WITHOUT CONTRAST TECHNIQUE: Multidetector CT imaging of the head and neck was performed using the standard protocol during bolus administration of intravenous contrast. Multiplanar CT image reconstructions and MIPs were obtained to evaluate the vascular anatomy. Carotid stenosis measurements (when applicable) are obtained utilizing NASCET criteria, using the distal internal carotid diameter as the denominator. RADIATION DOSE REDUCTION: This exam was performed according to the departmental dose-optimization program which includes automated exposure control, adjustment of the mA and/or kV according to patient size and/or use of iterative reconstruction technique. CONTRAST:  65mL OMNIPAQUE IOHEXOL 350 MG/ML SOLN COMPARISON:  Brain MRI 10/16/2023.  Head CT 10/16/2023. FINDINGS: CT HEAD FINDINGS Brain: Generalized parenchymal atrophy. Redemonstrated chronic cortical/subcortical right PCA territory infarct within the right occipital lobe. Background mild patchy and ill-defined hypoattenuation within the cerebral white matter, nonspecific  but compatible with chronic small vessel disease. There is no acute intracranial hemorrhage. No acute demarcated cortical infarct. No extra-axial fluid collection. No evidence of an intracranial mass. No midline shift. Vascular: No hyperdense vessel.  Atherosclerotic calcifications. Skull: No calvarial fracture or aggressive osseous lesion. Sinuses/Orbits: No mass or acute finding within the imaged orbits. No  significant paranasal sinus disease. Review of the MIP images confirms the above findings CTA NECK FINDINGS Aortic arch: Standard aortic branching. Atherosclerotic plaque within the visualized thoracic aorta and proximal major branch vessels of the neck. The innominate artery origin is incompletely included in the field of view. Within this limitation, there is no appreciable hemodynamically significant innominate or proximal subclavian artery stenosis. Right carotid system: CCA and ICA patent within the neck without hemodynamically significant stenosis (50% or greater). Mild atherosclerotic plaque about the carotid bifurcation and within the ICA. Partially retropharyngeal course of the cervical ICA. Left carotid system: CCA and ICA patent within the neck. Atherosclerotic plaque within the proximal ICA resulting in up to 40% stenosis. Atherosclerotic plaque within the mid cervical ICA resulting in up to 50% stenosis. Atherosclerotic plaque within the distal cervical ICA resulting in 30% stenosis. Vertebral arteries: Patent within the neck. The left vertebral artery is dominant. Atherosclerotic plaque within the left vertebral artery at the V1/V2 junction resulting in mild stenosis. Skeleton: Poor dentition. Cervical spondylosis. Nonacute fracture deformity of the posterolateral right third rib. Chronic fracture deformity of the distal right clavicle. Other neck: Enlarged multinodular thyroid gland. The largest nodule is located within the right lobe inferiorly, measuring 4 mm and slightly extending into the upper mediastinum. The thyroid was previously assessed by ultrasound on 10/31/2022. Please refer to this prior examination for further description and for recommendations. Upper chest: No consolidation within the imaged lung apices. Emphysema. Review of the MIP images confirms the above findings CTA HEAD FINDINGS Anterior circulation: The intracranial internal carotid arteries are patent. Atherosclerotic plaque  within both vessels. Most notably, most notably, there is moderate/severe stenosis of the cavernous/paraclinoid internal carotid arteries, bilaterally. The M1 middle cerebral arteries are patent. Severe stenosis within a proximal M2 right MCA vessel (series 13, image 22). The anterior cerebral arteries are patent. No intracranial aneurysm is identified. Posterior circulation: The intracranial vertebral arteries are patent. Atherosclerotic plaque within the left vertebral artery V4 segment with no more than mild stenosis. The basilar artery is patent. As carotid plaque within the proximal basilar artery with up to moderate stenosis. Posterior cerebral arteries are patent proximally. Attenuated appearance of distal right PCA branches. Posterior communicating arteries are diminutive or absent, bilaterally. Venous sinuses: Within the limitations of contrast timing, no convincing thrombus. Anatomic variants: As described. Review of the MIP images confirms the above findings IMPRESSION: Non-contrast head CT: 1.  No evidence of an acute intracranial abnormality. 2. Known chronic right PCA territory infarct. 3. Background parenchymal atrophy and mild cerebral white matter chronic small vessel ischemic disease. CTA neck: 1. The common carotid and internal carotid arteries are patent within the neck. Atherosclerotic plaque bilaterally as described within body of the report, and most notably as follows. Up to 40% stenosis of the proximal cervical left ICA. Up to 50% stenosis of the mid cervical left ICA. 30% stenosis of the distal cervical left ICA. 2. The vertebral arteries are patent within the neck. Atherosclerotic plaque within the left vertebral artery at the V1/V2 junction with mild stenosis. 3. Aortic Atherosclerosis (ICD10-I70.0) and Emphysema (ICD10-J43.9). 4. Nonacute fracture deformity of the posterolateral right third rib. 5.  Chronic fracture deformity of the distal right clavicle. 6. Cervical spondylosis. CTA  head: 1. No proximal intracranial large vessel occlusion identified. 2. Intracranial atherosclerotic disease with multifocal stenoses, most notably as follows. 3. Moderate/severe stenoses of the cavernous/paraclinoid internal carotid arteries, bilaterally. 4. Severe stenosis of a proximal M2 right middle cerebral artery vessel. 5. Up to moderate stenosis of the proximal basilar artery. 6. Attenuated appearance of distal right posterior cerebral artery branches. Electronically Signed   By: Jackey Loge D.O.   On: 10/17/2023 14:36   EEG adult Result Date: 10/17/2023 Jefferson Fuel, MD     10/17/2023 11:00 AM Routine EEG Report Anne Phillips is a 73 y.o. female with a history of seizure who is undergoing an EEG to evaluate for seizures. Report: This EEG was acquired with electrodes placed according to the International 10-20 electrode system (including Fp1, Fp2, F3, F4, C3, C4, P3, P4, O1, O2, T3, T4, T5, T6, A1, A2, Fz, Cz, Pz). The following electrodes were missing or displaced: none. The occipital dominant rhythm was 10 Hz. This activity is reactive to stimulation. Drowsiness was manifested by background fragmentation; deeper stages of sleep were identified by K complexes and sleep spindles. There was no focal slowing. There were no interictal epileptiform discharges. There were no electrographic seizures identified. There was no abnormal response to photic stimulation or hyperventilation. Impression: This EEG was obtained while awake and asleep and is normal.   Clinical Correlation: Normal EEGs, however, do not rule out epilepsy. Bing Neighbors, MD [facility] [phone] If 7pm- 7am, please page neurology on call as listed in AMION.   MR BRAIN WO CONTRAST Result Date: 10/16/2023 CLINICAL DATA:  Neuro deficit, acute, stroke suspected EXAM: MRI HEAD WITHOUT CONTRAST TECHNIQUE: Multiplanar, multiecho pulse sequences of the brain and surrounding structures were obtained without intravenous  contrast. COMPARISON:  Same day CT head. FINDINGS: Motion limited and incomplete study due to patient intolerance. Brain: No acute infarction, hemorrhage, hydrocephalus, extra-axial collection or mass lesion. Remote right occipital infarct (PCA territory). Vascular: Major arterial flow voids are maintained skull base. Skull and upper cervical spine: Normal marrow signal. Sinuses/Orbits: Clear sinuses.  No acute orbital findings. Other: No mastoid effusions. IMPRESSION: 1. Motion limited and incomplete study without obvious acute abnormality. 2. Remote right PCA territory infarct. Electronically Signed   By: Feliberto Harts M.D.   On: 10/16/2023 23:45   US Venous Img Upper Uni Left (DVT) Result Date: 10/16/2023 CLINICAL DATA:  [id number] Left upper extremity swelling [id number] EXAM: Left UPPER EXTREMITY VENOUS DOPPLER ULTRASOUND TECHNIQUE: Gray-scale sonography with graded compression, as well as color Doppler and duplex ultrasound were performed to evaluate the upper extremity deep venous system from the level of the subclavian vein and including the jugular, axillary, basilic, radial, ulnar and upper cephalic vein. Spectral Doppler was utilized to evaluate flow at rest and with distal augmentation maneuvers. COMPARISON:  None Available. FINDINGS: Contralateral Subclavian Vein: Respiratory phasicity is normal and symmetric with the symptomatic side. No evidence of thrombus. Normal compressibility. Internal Jugular Vein: No evidence of thrombus. Normal compressibility, respiratory phasicity and response to augmentation. Subclavian Vein: No evidence of thrombus. Normal compressibility, respiratory phasicity and response to augmentation. Axillary Vein: No evidence of thrombus. Normal compressibility, respiratory phasicity and response to augmentation. Cephalic Vein: Not visualized. Basilic Vein: No evidence of thrombus. Normal compressibility, respiratory phasicity and response to augmentation. Brachial Veins: Not  visualized. Radial Veins: Not visualized Ulnar Veins: Not visualized. Venous Reflux:  None visualized. Other Findings:  Subcutaneus soft  tissue edema. IMPRESSION: No evidence of DVT within the left upper extremity with markedly limited evaluation: Cephalic, brachial, radial, ulnar veins not visualized. Electronically Signed   By: Tish Frederickson M.D.   On: 10/16/2023 20:14   ECHOCARDIOGRAM COMPLETE Result Date: 10/16/2023    ECHOCARDIOGRAM REPORT   Patient Name:   Anne Phillips Date of Exam: 10/16/2023 Medical Rec #:  [id number]        Height:       60.0 in Accession #:    [id number]       Weight:       130.1 lb Date of Birth:  1951/08/12        BSA:          1.555 m Patient Age:    72 years         BP:           181/92 mmHg Patient Gender: F                HR:           64 bpm. Exam Location:  [facility] Procedure: 2D Echo, Cardiac Doppler and Color Doppler (Both Spectral and Color            Flow Doppler were utilized during procedure). Indications:     TIA G45.9  History:         Patient has prior history of Echocardiogram examinations. CAD;                  Risk Factors:Hypertension.  Sonographer:     Neysa Bonito Roar Referring Phys:  [id number] Francoise Schaumann [place] Diagnosing Phys: Debbe Odea MD IMPRESSIONS  1. Left ventricular ejection fraction, by estimation, is 55 to 60%. The left ventricle has normal function. The left ventricle has no regional wall motion abnormalities. There is moderate concentric left ventricular hypertrophy. Left ventricular diastolic parameters are consistent with Grade II diastolic dysfunction (pseudonormalization).  2. Right ventricular systolic function is low normal. The right ventricular size is normal. There is moderately elevated pulmonary artery systolic pressure.  3. Left atrial size was moderately dilated.  4. Right atrial size was mildly dilated.  5. A small pericardial effusion is present. The pericardial effusion is circumferential.  6. The mitral valve is normal in structure. Mild  mitral valve regurgitation.  7. Tricuspid valve regurgitation is mild to moderate.  8. The aortic valve is tricuspid. Aortic valve regurgitation is mild. Aortic valve sclerosis is present, with no evidence of aortic valve stenosis.  9. The inferior vena cava is normal in size with <50% respiratory variability, suggesting right atrial pressure of 8 mmHg. FINDINGS  Left Ventricle: Left ventricular ejection fraction, by estimation, is 55 to 60%. The left ventricle has normal function. The left ventricle has no regional wall motion abnormalities. Strain imaging was not performed. The left ventricular internal cavity  size was normal in size. There is moderate concentric left ventricular hypertrophy. Left ventricular diastolic parameters are consistent with Grade II diastolic dysfunction (pseudonormalization). Right Ventricle: The right ventricular size is normal. No increase in right ventricular wall thickness. Right ventricular systolic function is low normal. There is moderately elevated pulmonary artery systolic pressure. The tricuspid regurgitant velocity  is 3.24 m/s, and with an assumed right atrial pressure of 8 mmHg, the estimated right ventricular systolic pressure is 50.0 mmHg. Left Atrium: Left atrial size was moderately dilated. Right Atrium: Right atrial size was mildly dilated. Pericardium: A small pericardial effusion is present. The pericardial effusion is circumferential.  Mitral Valve: The mitral valve is normal in structure. Mild mitral valve regurgitation. MV peak gradient, 16.3 mmHg. The mean mitral valve gradient is 7.0 mmHg. Tricuspid Valve: The tricuspid valve is normal in structure. Tricuspid valve regurgitation is mild to moderate. Aortic Valve: The aortic valve is tricuspid. Aortic valve regurgitation is mild. Aortic regurgitation PHT measures 472 msec. Aortic valve sclerosis is present, with no evidence of aortic valve stenosis. Aortic valve mean gradient measures 7.0 mmHg. Aortic valve peak  gradient measures 13.8 mmHg. Aortic valve area, by VTI measures 1.03 cm. Pulmonic Valve: The pulmonic valve was normal in structure. Pulmonic valve regurgitation is mild. Aorta: The aortic root and ascending aorta are structurally normal, with no evidence of dilitation. Venous: The inferior vena cava is normal in size with less than 50% respiratory variability, suggesting right atrial pressure of 8 mmHg. IAS/Shunts: No atrial level shunt detected by color flow Doppler. Additional Comments: 3D imaging was not performed.  LEFT VENTRICLE PLAX 2D LVIDd:         3.50 cm   Diastology LVIDs:         2.50 cm   LV e' medial:    4.35 cm/s LV PW:         1.60 cm   LV E/e' medial:  34.0 LV IVS:        2.10 cm   LV e' lateral:   7.94 cm/s LVOT diam:     1.70 cm   LV E/e' lateral: 18.6 LV SV:         41 LV SV Index:   26 LVOT Area:     2.27 cm  RIGHT VENTRICLE RV Basal diam:  3.60 cm RV Mid diam:    2.60 cm RV S prime:     8.59 cm/s TAPSE (M-mode): 0.9 cm LEFT ATRIUM           Index        RIGHT ATRIUM           Index LA diam:      4.30 cm 2.77 cm/m   RA Area:     21.40 cm LA Vol (A4C): 74.2 ml 47.73 ml/m  RA Volume:   58.60 ml  37.69 ml/m  AORTIC VALVE                     PULMONIC VALVE AV Area (Vmax):    1.05 cm      PV Vmax:          1.12 m/s AV Area (Vmean):   0.92 cm      PV Peak grad:     5.0 mmHg AV Area (VTI):     1.03 cm      PR End Diast Vel: 13.84 msec AV Vmax:           186.00 cm/s   RVOT Peak grad:   1 mmHg AV Vmean:          121.000 cm/s AV VTI:            0.398 m AV Peak Grad:      13.8 mmHg AV Mean Grad:      7.0 mmHg LVOT Vmax:         85.90 cm/s LVOT Vmean:        49.000 cm/s LVOT VTI:          0.181 m LVOT/AV VTI ratio: 0.45 AI PHT:            472 msec  AORTA Ao Root diam: 2.60 cm Ao Asc diam:  2.90 cm MITRAL VALVE                TRICUSPID VALVE MV Area (PHT): 4.36 cm     TR Peak grad:   42.0 mmHg MV Area VTI:   1.21 cm     TR Vmax:        324.00 cm/s MV Peak grad:  16.3 mmHg MV Mean grad:  7.0 mmHg      SHUNTS MV Vmax:       2.02 m/s     Systemic VTI:  0.18 m MV Vmean:      117.0 cm/s   Systemic Diam: 1.70 cm MV Decel Time: 174 msec MV E velocity: 148.00 cm/s MV A velocity: 77.10 cm/s MV E/A ratio:  1.92 Debbe Odea MD Electronically signed by Debbe Odea MD Signature Date/Time: 10/16/2023/5:41:41 PM    Final    DG Abd 1 View Result Date: 10/16/2023 CLINICAL DATA:  54098 TIA (transient ischemic attack) 11914 EXAM: ABDOMEN - 1 VIEW COMPARISON:  08/24/2015 FINDINGS: The bowel gas pattern is nonobstructive. No radio-opaque calculi or other significant radiographic abnormality are seen. Prominent atherosclerotic vascular calcifications. IMPRESSION: Negative. Electronically Signed   By: Duanne Guess D.O.   On: 10/16/2023 16:02   DG Chest 1 View Result Date: 10/16/2023 CLINICAL DATA:  78295 TIA (transient ischemic attack) [id number] EXAM: CHEST  1 VIEW COMPARISON:  12/31/2022 FINDINGS: Moderate cardiomegaly. Aortic atherosclerosis. No focal airspace consolidation, pleural effusion, or pneumothorax. IMPRESSION: Moderate cardiomegaly. No acute cardiopulmonary findings. Electronically Signed   By: Duanne Guess D.O.   On: 10/16/2023 16:01   DG HIPS BILAT WITH PELVIS 2V Result Date: 10/16/2023 CLINICAL DATA:  62130 TIA (transient ischemic attack) [id number] EXAM: DG HIP (WITH OR WITHOUT PELVIS) 2V BILAT COMPARISON:  None Available. FINDINGS: Sclerotic and lucent changes of the bilateral femoral heads suggesting avascular necrosis. Flattening of both femoral head contours, right worse than left. Hip joints remain aligned without dislocation. Mild to moderate degenerative changes bilaterally. No pelvic diastasis. Atherosclerotic vascular calcifications. 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 Flattening of both femoral head contours, right worse than left. Electronically Signed   By: Duanne Guess D.O.   On: 10/16/2023 16:00   CT Head Wo Contrast Result Date:  10/16/2023 CLINICAL DATA:  Seizure disorder, clinical change EXAM: CT HEAD WITHOUT CONTRAST TECHNIQUE: Contiguous axial images were obtained from the base of the skull through the vertex without intravenous contrast. RADIATION DOSE REDUCTION: This exam was performed according to the departmental dose-optimization program which includes automated exposure control, adjustment of the mA and/or kV according to patient size and/or use of iterative reconstruction technique. COMPARISON:  Head CT 12/31/2022 FINDINGS: Brain: No hemorrhage. No hydrocephalus. No extra-axial fluid collection. No mass effect. No mass lesion. 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 Skull: Normal. Negative for fracture or focal lesion. Sinuses/Orbits: No middle ear or mastoid effusion. Paranasal sinuses clear. Orbits are unremarkable Other: None. 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 If there is clinical concern for an acute infarct, consider further evaluation with MRI. Electronically Signed   By: Lorenza Cambridge M.D.   On: 10/16/2023 08:55    Microbiology: Results for orders placed or performed during the hospital encounter of 12/31/22  Culture, blood (Routine X 2)  w Reflex to ID Panel     Status: None   Collection Time: 01/04/23  5:53 PM   Specimen: BLOOD LEFT HAND  Result Value Ref Range Status   Specimen Description BLOOD LEFT HAND  Final   Special Requests   Final    BOTTLES DRAWN AEROBIC AND ANAEROBIC Blood Culture results may not be optimal due to an excessive volume of blood received in culture bottles   Culture   Final    NO GROWTH 5 DAYS Performed at [facility], [address] [place], [place] [address]    Report Status 01/09/2023 FINAL  Final  Culture, blood (Routine X 2) w  Reflex to ID Panel     Status: None   Collection Time: 01/04/23  6:01 PM   Specimen: BLOOD  Result Value Ref Range Status   Specimen Description BLOOD RIGHT ANTECUBITAL  Final   Special Requests   Final    BOTTLES DRAWN AEROBIC AND ANAEROBIC Blood Culture adequate volume   Culture   Final    NO GROWTH 5 DAYS Performed at [facility], [address] [place], [place] [address]    Report Status 01/09/2023 FINAL  Final    Labs: CBC: Recent Labs  Lab 10/31/23 0216 11/01/23 0357  WBC 6.8 5.7  HGB 8.9* 9.1*  HCT 27.3* 27.2*  MCV 90.4 88.3  PLT 178 173   Basic Metabolic Panel: Recent Labs  Lab 10/31/23 0213 10/31/23 0216 11/01/23 0357 11/02/23 0437  NA  --  130* 130* 130*  K  --  3.7 3.9 4.2  CL  --  102 102 101  CO2  --  16* 19* 21*  GLUCOSE  --  95 142* 85  BUN  --  26* 23 24*  CREATININE  --  1.10* 1.07* 1.04*  CALCIUM  --  8.7* 8.8* 9.0  MG 1.3*  --   --   --    Liver Function Tests: Recent Labs  Lab 10/31/23 0324 11/01/23 0357  AST 17 19  ALT 16 14  ALKPHOS 64 67  BILITOT 0.7 0.6  PROT 7.3 7.0  ALBUMIN 3.5 3.3*   CBG: Recent Labs  Lab 10/31/23 0215  GLUCAP 84    Discharge time spent: {LESS THAN/GREATER THAN:26388} 30 minutes.  Signed: Marcelino Duster, MD [facility] 11/02/2023

## 2023-11-03 DIAGNOSIS — E871 Hypo-osmolality and hyponatremia: Secondary | ICD-10-CM | POA: Insufficient documentation

## 2023-11-03 DIAGNOSIS — Z91148 Patient's other noncompliance with medication regimen for other reason: Secondary | ICD-10-CM

## 2023-11-04 LAB — LACOSAMIDE: Lacosamide: 6.2 ug/mL (ref 5.0–10.0)

## 2023-11-11 ENCOUNTER — Other Ambulatory Visit: Payer: Self-pay

## 2023-11-20 ENCOUNTER — Ambulatory Visit
Admit: 2023-11-20 | Payer: MEDICARE | Attending: Student in an Organized Health Care Education/Training Program | Primary: Student in an Organized Health Care Education/Training Program

## 2023-12-07 ENCOUNTER — Ambulatory Visit
Admit: 2023-12-07 | Attending: Student in an Organized Health Care Education/Training Program | Primary: Student in an Organized Health Care Education/Training Program

## 2024-04-01 ENCOUNTER — Other Ambulatory Visit: Payer: Self-pay

## 2024-04-01 DEATH — deceased

## 2024-04-28 ENCOUNTER — Other Ambulatory Visit: Payer: Self-pay

## 2024-05-11 ENCOUNTER — Other Ambulatory Visit: Payer: Self-pay
